# Patient Record
Sex: Male | Born: 1940
Health system: Southern US, Community
[De-identification: ages and names within clinical notes are randomized; demographics above are authoritative.]

## PROBLEM LIST (undated history)

## (undated) DIAGNOSIS — Z8679 Personal history of other diseases of the circulatory system: Secondary | ICD-10-CM

## (undated) DIAGNOSIS — I00 Rheumatic fever without heart involvement: Secondary | ICD-10-CM

## (undated) DIAGNOSIS — I219 Acute myocardial infarction, unspecified: Secondary | ICD-10-CM

## (undated) DIAGNOSIS — I10 Essential (primary) hypertension: Secondary | ICD-10-CM

## (undated) DIAGNOSIS — J449 Chronic obstructive pulmonary disease, unspecified: Secondary | ICD-10-CM

## (undated) DIAGNOSIS — M109 Gout, unspecified: Secondary | ICD-10-CM

## (undated) DIAGNOSIS — I2510 Atherosclerotic heart disease of native coronary artery without angina pectoris: Secondary | ICD-10-CM

## (undated) DIAGNOSIS — I209 Angina pectoris, unspecified: Secondary | ICD-10-CM

## (undated) DIAGNOSIS — N390 Urinary tract infection, site not specified: Secondary | ICD-10-CM

## (undated) DIAGNOSIS — G629 Polyneuropathy, unspecified: Secondary | ICD-10-CM

## (undated) DIAGNOSIS — N289 Disorder of kidney and ureter, unspecified: Secondary | ICD-10-CM

## (undated) DIAGNOSIS — C669 Malignant neoplasm of unspecified ureter: Secondary | ICD-10-CM

## (undated) DIAGNOSIS — M75120 Complete rotator cuff tear or rupture of unspecified shoulder, not specified as traumatic: Secondary | ICD-10-CM

## (undated) DIAGNOSIS — M1990 Unspecified osteoarthritis, unspecified site: Secondary | ICD-10-CM

## (undated) DIAGNOSIS — R0602 Shortness of breath: Secondary | ICD-10-CM

## (undated) DIAGNOSIS — E785 Hyperlipidemia, unspecified: Secondary | ICD-10-CM

## (undated) DIAGNOSIS — Z8719 Personal history of other diseases of the digestive system: Secondary | ICD-10-CM

## (undated) DIAGNOSIS — E119 Type 2 diabetes mellitus without complications: Secondary | ICD-10-CM

## (undated) DIAGNOSIS — I255 Ischemic cardiomyopathy: Secondary | ICD-10-CM

## (undated) DIAGNOSIS — I739 Peripheral vascular disease, unspecified: Secondary | ICD-10-CM

## (undated) DIAGNOSIS — J189 Pneumonia, unspecified organism: Secondary | ICD-10-CM

## (undated) DIAGNOSIS — R011 Cardiac murmur, unspecified: Secondary | ICD-10-CM

## (undated) DIAGNOSIS — E7800 Pure hypercholesterolemia, unspecified: Secondary | ICD-10-CM

## (undated) HISTORY — PX: CARPAL TUNNEL RELEASE: SHX101

## (undated) HISTORY — PX: CHOLECYSTECTOMY: SHX55

## (undated) HISTORY — PX: CORONARY ARTERY BYPASS GRAFT: SHX141

## (undated) HISTORY — PX: GLAUCOMA SURGERY: SHX656

## (undated) HISTORY — PX: TRIGGER FINGER RELEASE: SHX641

## (undated) HISTORY — PX: CARDIAC CATHETERIZATION: SHX172

## (undated) HISTORY — PX: EYE SURGERY: SHX253

---
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
## 1995-09-06 DIAGNOSIS — Z8719 Personal history of other diseases of the digestive system: Secondary | ICD-10-CM

## 1995-09-06 HISTORY — DX: Personal history of other diseases of the digestive system: Z87.19

## 2001-12-04 DIAGNOSIS — C669 Malignant neoplasm of unspecified ureter: Secondary | ICD-10-CM

## 2001-12-04 HISTORY — DX: Malignant neoplasm of unspecified ureter: C66.9

## 2001-12-19 HISTORY — PX: NEPHRECTOMY: SHX65

## 2006-08-13 ENCOUNTER — Inpatient Hospital Stay (HOSPITAL_COMMUNITY): Admission: EM | Admit: 2006-08-13 | Discharge: 2006-08-14 | Payer: Self-pay | Admitting: Emergency Medicine

## 2007-01-25 ENCOUNTER — Encounter: Payer: Self-pay | Admitting: Emergency Medicine

## 2007-01-25 ENCOUNTER — Inpatient Hospital Stay (HOSPITAL_COMMUNITY): Admission: AD | Admit: 2007-01-25 | Discharge: 2007-01-26 | Payer: Self-pay | Admitting: Family Medicine

## 2007-01-25 ENCOUNTER — Ambulatory Visit (HOSPITAL_COMMUNITY): Admission: RE | Admit: 2007-01-25 | Discharge: 2007-01-25 | Payer: Self-pay | Admitting: Emergency Medicine

## 2007-01-30 ENCOUNTER — Ambulatory Visit (HOSPITAL_COMMUNITY): Admission: RE | Admit: 2007-01-30 | Discharge: 2007-01-30 | Payer: Self-pay | Admitting: Family Medicine

## 2007-02-05 ENCOUNTER — Emergency Department (HOSPITAL_COMMUNITY): Admission: EM | Admit: 2007-02-05 | Discharge: 2007-02-05 | Payer: Self-pay | Admitting: Emergency Medicine

## 2007-02-08 ENCOUNTER — Ambulatory Visit (HOSPITAL_COMMUNITY): Admission: RE | Admit: 2007-02-08 | Discharge: 2007-02-08 | Payer: Self-pay | Admitting: Family Medicine

## 2007-02-08 ENCOUNTER — Ambulatory Visit: Payer: Self-pay | Admitting: Orthopedic Surgery

## 2007-02-15 ENCOUNTER — Ambulatory Visit: Payer: Self-pay | Admitting: Orthopedic Surgery

## 2007-04-14 ENCOUNTER — Emergency Department (HOSPITAL_COMMUNITY): Admission: EM | Admit: 2007-04-14 | Discharge: 2007-04-14 | Payer: Self-pay | Admitting: Emergency Medicine

## 2007-06-11 ENCOUNTER — Ambulatory Visit (HOSPITAL_COMMUNITY): Admission: RE | Admit: 2007-06-11 | Discharge: 2007-06-11 | Payer: Self-pay | Admitting: Family Medicine

## 2007-06-12 ENCOUNTER — Emergency Department (HOSPITAL_COMMUNITY): Admission: EM | Admit: 2007-06-12 | Discharge: 2007-06-13 | Payer: Self-pay | Admitting: Emergency Medicine

## 2007-07-09 ENCOUNTER — Encounter: Payer: Self-pay | Admitting: Family Medicine

## 2007-07-09 ENCOUNTER — Inpatient Hospital Stay (HOSPITAL_COMMUNITY): Admission: EM | Admit: 2007-07-09 | Discharge: 2007-07-11 | Payer: Self-pay | Admitting: Emergency Medicine

## 2007-07-10 ENCOUNTER — Encounter: Payer: Self-pay | Admitting: Interventional Radiology

## 2007-07-10 ENCOUNTER — Encounter (INDEPENDENT_AMBULATORY_CARE_PROVIDER_SITE_OTHER): Payer: Self-pay | Admitting: Interventional Radiology

## 2007-07-14 ENCOUNTER — Ambulatory Visit: Payer: Self-pay | Admitting: Internal Medicine

## 2007-07-14 ENCOUNTER — Inpatient Hospital Stay (HOSPITAL_COMMUNITY): Admission: EM | Admit: 2007-07-14 | Discharge: 2007-07-18 | Payer: Self-pay | Admitting: Emergency Medicine

## 2007-07-15 ENCOUNTER — Encounter (INDEPENDENT_AMBULATORY_CARE_PROVIDER_SITE_OTHER): Payer: Self-pay | Admitting: Internal Medicine

## 2007-07-24 ENCOUNTER — Encounter: Payer: Self-pay | Admitting: Interventional Radiology

## 2007-07-26 ENCOUNTER — Ambulatory Visit (HOSPITAL_COMMUNITY): Admission: RE | Admit: 2007-07-26 | Discharge: 2007-07-26 | Payer: Self-pay | Admitting: Interventional Radiology

## 2007-07-30 ENCOUNTER — Inpatient Hospital Stay (HOSPITAL_COMMUNITY): Admission: EM | Admit: 2007-07-30 | Discharge: 2007-08-03 | Payer: Self-pay | Admitting: Emergency Medicine

## 2007-07-30 ENCOUNTER — Ambulatory Visit: Payer: Self-pay | Admitting: Pulmonary Disease

## 2007-07-31 ENCOUNTER — Encounter: Payer: Self-pay | Admitting: Pulmonary Disease

## 2007-08-01 ENCOUNTER — Ambulatory Visit: Payer: Self-pay | Admitting: Vascular Surgery

## 2007-08-07 ENCOUNTER — Emergency Department (HOSPITAL_COMMUNITY): Admission: EM | Admit: 2007-08-07 | Discharge: 2007-08-07 | Payer: Self-pay | Admitting: Emergency Medicine

## 2007-08-15 ENCOUNTER — Encounter (INDEPENDENT_AMBULATORY_CARE_PROVIDER_SITE_OTHER): Payer: Self-pay | Admitting: Emergency Medicine

## 2007-08-15 ENCOUNTER — Inpatient Hospital Stay (HOSPITAL_COMMUNITY): Admission: EM | Admit: 2007-08-15 | Discharge: 2007-08-17 | Payer: Self-pay | Admitting: Emergency Medicine

## 2007-08-20 ENCOUNTER — Ambulatory Visit (HOSPITAL_COMMUNITY): Admission: RE | Admit: 2007-08-20 | Discharge: 2007-08-20 | Payer: Self-pay | Admitting: Interventional Radiology

## 2007-08-27 ENCOUNTER — Emergency Department (HOSPITAL_COMMUNITY): Admission: EM | Admit: 2007-08-27 | Discharge: 2007-08-27 | Payer: Self-pay | Admitting: Emergency Medicine

## 2007-10-02 ENCOUNTER — Inpatient Hospital Stay (HOSPITAL_COMMUNITY): Admission: EM | Admit: 2007-10-02 | Discharge: 2007-10-03 | Payer: Self-pay | Admitting: Emergency Medicine

## 2007-10-03 ENCOUNTER — Encounter (INDEPENDENT_AMBULATORY_CARE_PROVIDER_SITE_OTHER): Payer: Self-pay | Admitting: Internal Medicine

## 2007-11-14 ENCOUNTER — Emergency Department (HOSPITAL_COMMUNITY): Admission: EM | Admit: 2007-11-14 | Discharge: 2007-11-14 | Payer: Self-pay | Admitting: Emergency Medicine

## 2007-11-19 ENCOUNTER — Emergency Department (HOSPITAL_COMMUNITY): Admission: EM | Admit: 2007-11-19 | Discharge: 2007-11-19 | Payer: Self-pay | Admitting: Emergency Medicine

## 2008-05-27 ENCOUNTER — Ambulatory Visit: Payer: Self-pay | Admitting: Cardiology

## 2008-06-26 ENCOUNTER — Ambulatory Visit: Payer: Self-pay | Admitting: Cardiology

## 2008-06-29 ENCOUNTER — Observation Stay (HOSPITAL_COMMUNITY): Admission: EM | Admit: 2008-06-29 | Discharge: 2008-06-29 | Payer: Self-pay | Admitting: Emergency Medicine

## 2008-07-21 ENCOUNTER — Ambulatory Visit: Payer: Self-pay | Admitting: Surgery

## 2008-07-24 ENCOUNTER — Ambulatory Visit: Payer: Self-pay | Admitting: Surgery

## 2008-07-24 ENCOUNTER — Ambulatory Visit (HOSPITAL_COMMUNITY): Admission: RE | Admit: 2008-07-24 | Discharge: 2008-07-24 | Payer: Self-pay | Admitting: Surgery

## 2008-08-07 ENCOUNTER — Ambulatory Visit: Payer: Self-pay | Admitting: *Deleted

## 2008-08-11 ENCOUNTER — Inpatient Hospital Stay (HOSPITAL_COMMUNITY): Admission: AD | Admit: 2008-08-11 | Discharge: 2008-08-20 | Payer: Self-pay | Admitting: Surgery

## 2008-08-11 ENCOUNTER — Ambulatory Visit: Payer: Self-pay | Admitting: Surgery

## 2008-08-12 ENCOUNTER — Encounter: Payer: Self-pay | Admitting: Surgery

## 2008-08-13 ENCOUNTER — Ambulatory Visit: Payer: Self-pay | Admitting: Physical Medicine & Rehabilitation

## 2008-08-20 ENCOUNTER — Inpatient Hospital Stay (HOSPITAL_COMMUNITY)
Admission: RE | Admit: 2008-08-20 | Discharge: 2008-08-27 | Payer: Self-pay | Admitting: Physical Medicine & Rehabilitation

## 2008-08-20 ENCOUNTER — Ambulatory Visit: Payer: Self-pay | Admitting: Physical Medicine & Rehabilitation

## 2008-09-15 ENCOUNTER — Ambulatory Visit: Payer: Self-pay | Admitting: Surgery

## 2008-09-23 ENCOUNTER — Ambulatory Visit: Payer: Self-pay | Admitting: Vascular Surgery

## 2008-09-26 ENCOUNTER — Ambulatory Visit: Payer: Self-pay | Admitting: Vascular Surgery

## 2008-09-26 ENCOUNTER — Inpatient Hospital Stay (HOSPITAL_COMMUNITY): Admission: RE | Admit: 2008-09-26 | Discharge: 2008-09-30 | Payer: Self-pay | Admitting: Vascular Surgery

## 2008-09-26 ENCOUNTER — Encounter: Payer: Self-pay | Admitting: Vascular Surgery

## 2008-09-29 ENCOUNTER — Ambulatory Visit: Payer: Self-pay | Admitting: Physical Medicine & Rehabilitation

## 2008-10-13 ENCOUNTER — Ambulatory Visit: Payer: Self-pay | Admitting: Surgery

## 2008-10-27 ENCOUNTER — Ambulatory Visit: Payer: Self-pay | Admitting: Surgery

## 2008-11-13 IMAGING — CR DG CHEST 2V
2 series · 2 of 2 positions shown · non-contrast
Comparison: none

HISTORY: Right chest pain, dyspnea, right lower leg swelling

[view not recorded (1 of 2)]
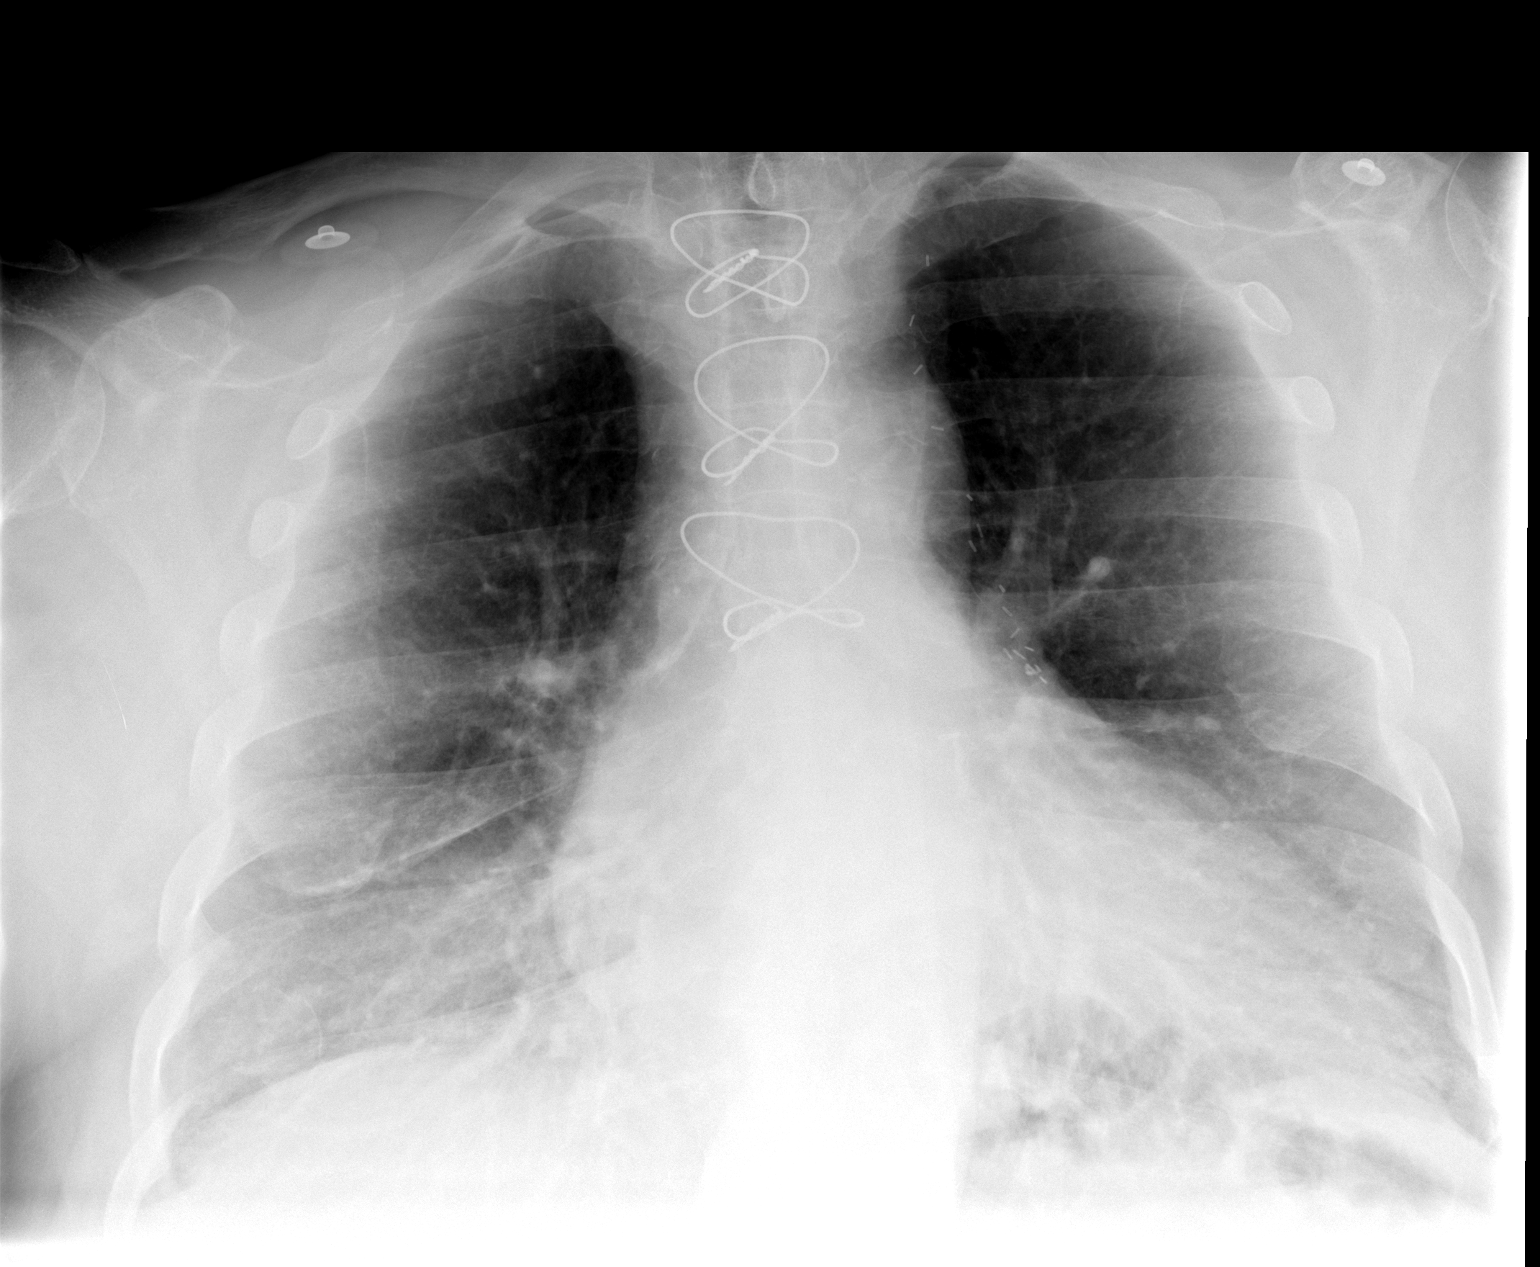

[view not recorded (2 of 2)]
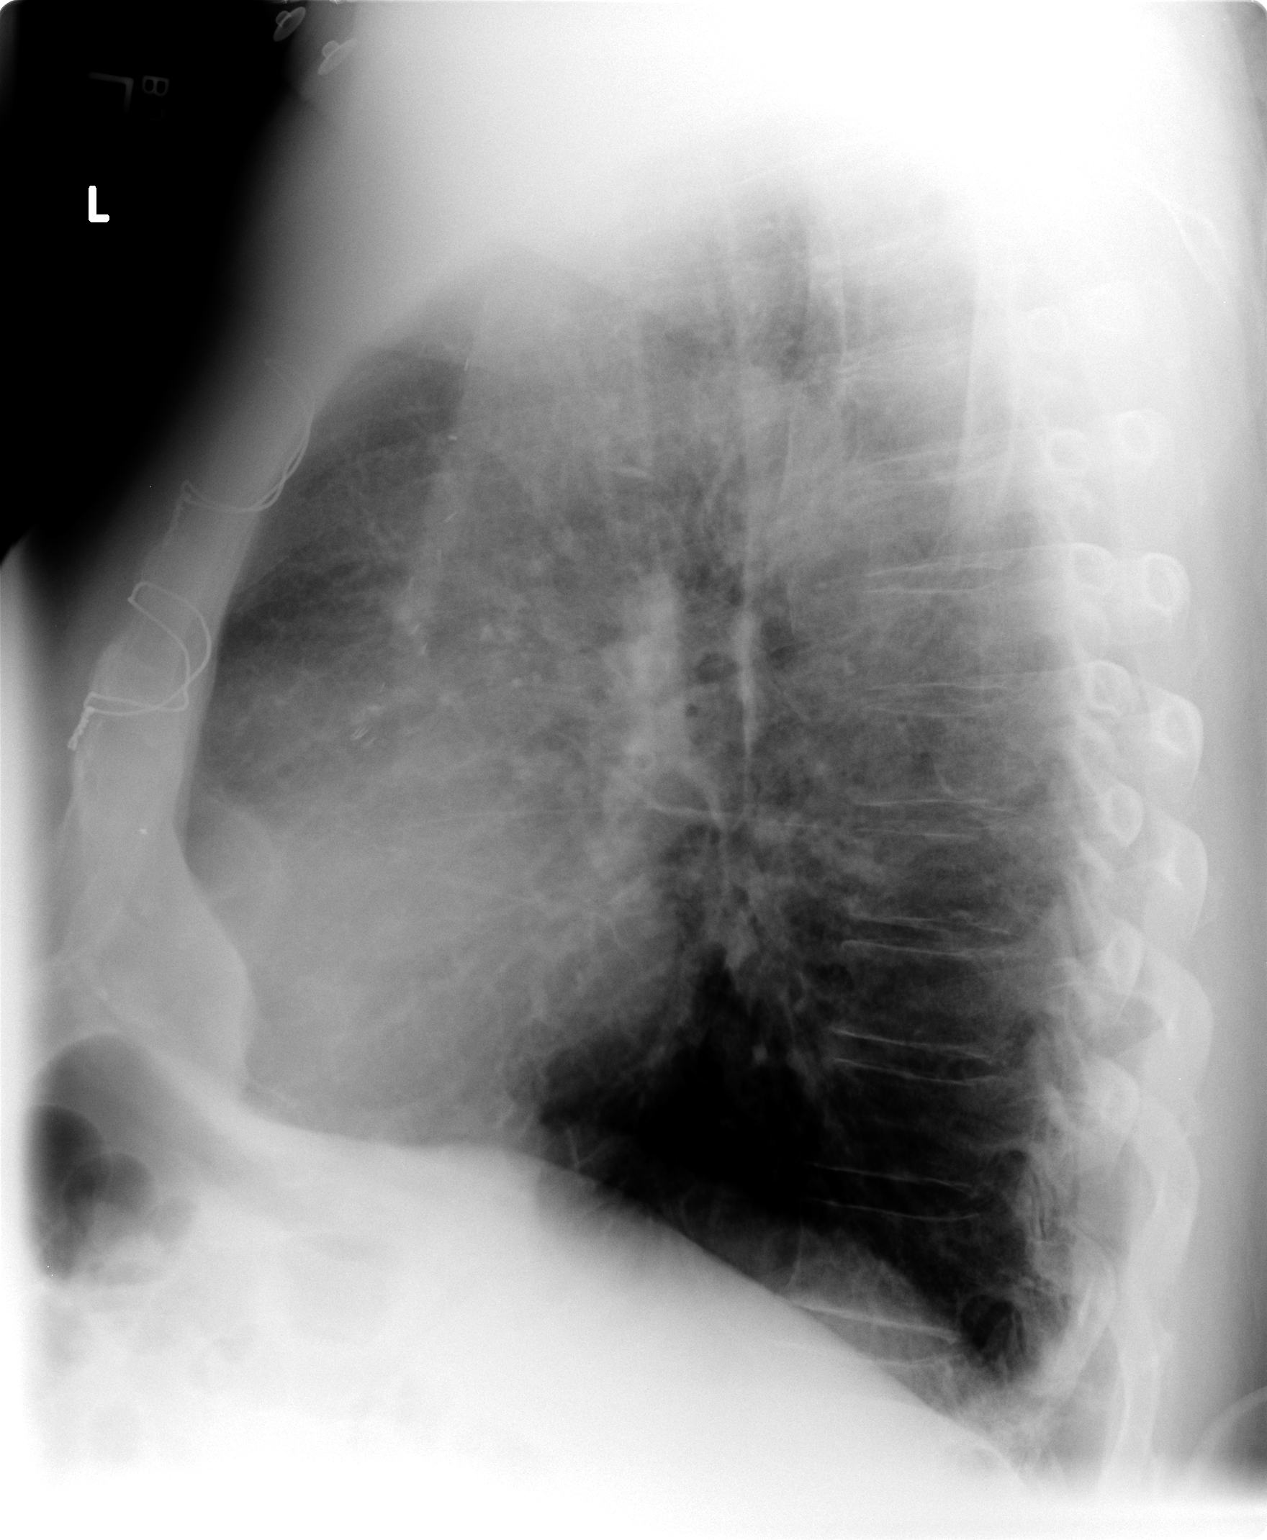

[2 of 2 positions shown; findings below may reference images not displayed]

CHEST 2 VIEWS:

Comparison 08/13/2006

Cardiac enlargement status post median sternotomy.
Slight vascular congestion.
Chronic accentuation of right basilar markings with either mild residual
atelectasis or infiltrate at right base.
Left basilar atelectasis present.
Upper lungs clear.
COPD and mild bronchitic changes.
IMPRESSION: Cardiomegaly status post median sternotomy.
COPD with probable bibasilar atelectasis though residual infiltrate or scarring
at right base not excluded.
No new abnormalities.

## 2008-11-24 ENCOUNTER — Ambulatory Visit: Payer: Self-pay | Admitting: Surgery

## 2009-02-05 ENCOUNTER — Ambulatory Visit (HOSPITAL_COMMUNITY): Admission: RE | Admit: 2009-02-05 | Discharge: 2009-02-05 | Payer: Self-pay | Admitting: Family Medicine

## 2009-02-26 ENCOUNTER — Encounter (INDEPENDENT_AMBULATORY_CARE_PROVIDER_SITE_OTHER): Payer: Self-pay | Admitting: *Deleted

## 2009-02-26 ENCOUNTER — Ambulatory Visit (HOSPITAL_COMMUNITY): Admission: RE | Admit: 2009-02-26 | Discharge: 2009-02-26 | Payer: Self-pay | Admitting: Family Medicine

## 2009-02-26 LAB — CONVERTED CEMR LAB
ALT: 17 U/L
AST: 17 U/L
Albumin: 4.2 g/dL
Alkaline Phosphatase: 57 U/L
BUN: 24 mg/dL
CO2: 22 meq/L
Calcium: 9.6 mg/dL
Chloride: 97 meq/L
Cholesterol: 220 mg/dL
Creatinine, Ser: 1.19 mg/dL
Free T4: 4.3 ng/dL
Glucose, Bld: 98 mg/dL
HDL: 43 mg/dL
Potassium: 4.6 meq/L
Sodium: 139 meq/L
TSH: 0.752 u[IU]/mL
Total Protein: 7.6 g/dL
Triglycerides: 486 mg/dL

## 2009-03-16 ENCOUNTER — Emergency Department (HOSPITAL_COMMUNITY): Admission: EM | Admit: 2009-03-16 | Discharge: 2009-03-16 | Payer: Self-pay | Admitting: Emergency Medicine

## 2009-05-12 ENCOUNTER — Ambulatory Visit (HOSPITAL_COMMUNITY): Admission: RE | Admit: 2009-05-12 | Discharge: 2009-05-12 | Payer: Self-pay | Admitting: Family Medicine

## 2009-07-02 ENCOUNTER — Ambulatory Visit (HOSPITAL_BASED_OUTPATIENT_CLINIC_OR_DEPARTMENT_OTHER): Admission: RE | Admit: 2009-07-02 | Discharge: 2009-07-02 | Payer: Self-pay | Admitting: Orthopedic Surgery

## 2009-08-05 HISTORY — PX: BELOW KNEE LEG AMPUTATION: SUR23

## 2009-08-12 ENCOUNTER — Encounter: Payer: Self-pay | Admitting: Cardiology

## 2009-08-19 ENCOUNTER — Encounter (INDEPENDENT_AMBULATORY_CARE_PROVIDER_SITE_OTHER): Payer: Self-pay | Admitting: *Deleted

## 2009-08-24 DIAGNOSIS — I509 Heart failure, unspecified: Secondary | ICD-10-CM | POA: Insufficient documentation

## 2009-08-24 DIAGNOSIS — I2589 Other forms of chronic ischemic heart disease: Secondary | ICD-10-CM

## 2009-08-24 DIAGNOSIS — M545 Low back pain: Secondary | ICD-10-CM | POA: Insufficient documentation

## 2009-09-05 HISTORY — PX: ABOVE KNEE LEG AMPUTATION: SUR20

## 2009-10-29 ENCOUNTER — Telehealth (INDEPENDENT_AMBULATORY_CARE_PROVIDER_SITE_OTHER): Payer: Self-pay

## 2009-11-28 ENCOUNTER — Encounter: Payer: Self-pay | Admitting: Orthopedic Surgery

## 2009-11-28 ENCOUNTER — Encounter: Admission: RE | Admit: 2009-11-28 | Discharge: 2009-11-28 | Payer: Self-pay | Admitting: Family Medicine

## 2009-12-02 ENCOUNTER — Ambulatory Visit (HOSPITAL_COMMUNITY): Admission: RE | Admit: 2009-12-02 | Discharge: 2009-12-02 | Payer: Self-pay | Admitting: Family Medicine

## 2009-12-02 ENCOUNTER — Encounter: Payer: Self-pay | Admitting: Orthopedic Surgery

## 2009-12-03 ENCOUNTER — Ambulatory Visit (HOSPITAL_COMMUNITY): Admission: RE | Admit: 2009-12-03 | Discharge: 2009-12-03 | Payer: Self-pay | Admitting: Orthopedic Surgery

## 2009-12-03 ENCOUNTER — Ambulatory Visit: Payer: Self-pay | Admitting: Orthopedic Surgery

## 2009-12-03 DIAGNOSIS — M75120 Complete rotator cuff tear or rupture of unspecified shoulder, not specified as traumatic: Secondary | ICD-10-CM

## 2009-12-03 DIAGNOSIS — M25469 Effusion, unspecified knee: Secondary | ICD-10-CM | POA: Insufficient documentation

## 2009-12-03 HISTORY — DX: Complete rotator cuff tear or rupture of unspecified shoulder, not specified as traumatic: M75.120

## 2009-12-04 ENCOUNTER — Ambulatory Visit (HOSPITAL_COMMUNITY): Admission: RE | Admit: 2009-12-04 | Discharge: 2009-12-04 | Payer: Self-pay | Admitting: Orthopedic Surgery

## 2009-12-04 ENCOUNTER — Ambulatory Visit: Payer: Self-pay | Admitting: Orthopedic Surgery

## 2009-12-07 ENCOUNTER — Telehealth: Payer: Self-pay | Admitting: Orthopedic Surgery

## 2009-12-08 ENCOUNTER — Telehealth: Payer: Self-pay | Admitting: Orthopedic Surgery

## 2009-12-09 ENCOUNTER — Ambulatory Visit (HOSPITAL_COMMUNITY): Admission: RE | Admit: 2009-12-09 | Discharge: 2009-12-09 | Payer: Self-pay | Admitting: Orthopedic Surgery

## 2009-12-10 ENCOUNTER — Telehealth: Payer: Self-pay | Admitting: Orthopedic Surgery

## 2009-12-15 ENCOUNTER — Encounter (INDEPENDENT_AMBULATORY_CARE_PROVIDER_SITE_OTHER): Payer: Self-pay | Admitting: *Deleted

## 2009-12-15 ENCOUNTER — Encounter: Payer: Self-pay | Admitting: Orthopedic Surgery

## 2009-12-17 ENCOUNTER — Telehealth: Payer: Self-pay | Admitting: Orthopedic Surgery

## 2009-12-23 ENCOUNTER — Encounter (INDEPENDENT_AMBULATORY_CARE_PROVIDER_SITE_OTHER): Payer: Self-pay | Admitting: *Deleted

## 2009-12-25 ENCOUNTER — Emergency Department (HOSPITAL_COMMUNITY): Admission: EM | Admit: 2009-12-25 | Discharge: 2009-12-25 | Payer: Self-pay | Admitting: Emergency Medicine

## 2009-12-29 ENCOUNTER — Telehealth: Payer: Self-pay | Admitting: Orthopedic Surgery

## 2010-01-28 ENCOUNTER — Telehealth: Payer: Self-pay | Admitting: Orthopedic Surgery

## 2010-02-08 ENCOUNTER — Encounter (INDEPENDENT_AMBULATORY_CARE_PROVIDER_SITE_OTHER): Payer: Self-pay | Admitting: Orthopedic Surgery

## 2010-02-08 ENCOUNTER — Ambulatory Visit (HOSPITAL_COMMUNITY): Admission: RE | Admit: 2010-02-08 | Discharge: 2010-02-08 | Payer: Self-pay | Admitting: Orthopedic Surgery

## 2010-03-02 ENCOUNTER — Telehealth (INDEPENDENT_AMBULATORY_CARE_PROVIDER_SITE_OTHER): Payer: Self-pay

## 2010-03-18 ENCOUNTER — Telehealth: Payer: Self-pay | Admitting: Orthopedic Surgery

## 2010-05-26 ENCOUNTER — Emergency Department (HOSPITAL_COMMUNITY): Admission: EM | Admit: 2010-05-26 | Discharge: 2010-05-26 | Payer: Self-pay | Admitting: Emergency Medicine

## 2010-06-10 ENCOUNTER — Emergency Department (HOSPITAL_COMMUNITY): Admission: EM | Admit: 2010-06-10 | Discharge: 2010-06-10 | Payer: Self-pay | Admitting: Emergency Medicine

## 2010-09-01 ENCOUNTER — Inpatient Hospital Stay (HOSPITAL_COMMUNITY): Admission: EM | Admit: 2010-09-01 | Discharge: 2010-09-03 | Payer: Self-pay | Source: Home / Self Care

## 2010-09-02 ENCOUNTER — Encounter (INDEPENDENT_AMBULATORY_CARE_PROVIDER_SITE_OTHER): Payer: Self-pay | Admitting: Internal Medicine

## 2010-09-24 NOTE — H&P (Signed)
[name], [name]               ACCOUNT NO.:  [id number]  MEDICAL RECORD NO.:  [id number]          PATIENT TYPE:  EMS  LOCATION:  ED                            FACILITY:  [facility]  PHYSICIAN:  Osvaldo Shipper, MD     DATE OF BIRTH:  04/01/1941  DATE OF ADMISSION:  09/02/2010 DATE OF DISCHARGE:  LH                             HISTORY & PHYSICAL   PRIMARY CARE PHYSICIAN:  Dr. Regino Schultze.  CARDIOLOGIST:  Used to be Dr. Dietrich Pates.  ADMISSION DIAGNOSES: 1. Dyspnea, etiology unclear. 2. Abnormal cardiac enzymes with a known history of coronary artery     disease. 3. Diaphoresis. 4. Hypoglycemia in a patient with diabetes type 2. 5. Peripheral vascular disease.  CHIEF COMPLAINT:  Feeling miserable.  HISTORY OF PRESENT ILLNESS:  The patient is a 70 year old Caucasian male, who is a very poor historian, who has a history of left above-knee amputation 2 years ago, who comes into the hospital because of complaints of feeling miserable.  He tells me that he has been feeling hot and cold all day long.  He denies any chest pain per say, but has been having shortness of breath.  He has a smoker's cough.  He is had some dry heaves.  His blood sugars have been running low today in the 60s for which he had to call EMS.  He denies any fever or chills.  No sick contacts.  He had been having right knee pain ever since his fall back in October, but does not take any medications at home.  He did have 2 days ago pain in his neck and in the left arm.  He denies any such pain currently.  So basically, a very poor history.  No specific details are available, but the patient just tells me that he does not feel well. He also admits to having lost a lot of weight in the last couple of years, but is unable to quantify.  MEDICATIONS AT HOME:  Unfortunately, the family did not bring in his home medication list.  He is on metformin only for his diabetes.  He is not on any insulin.  He also takes zolpidem for  insomnia.  The rest of the medications will be brought in tomorrow morning.  ALLERGIES: 1. IVP DYE. 2. PENICILLIN. 3. ERYTHROMYCIN.  PAST MEDICAL HISTORY: 1. Positive for coronary artery disease, status post CABG 6 years ago     in [place]. 2. He has had above-knee amputation on the left side 2 years ago.  He     prior to that had a femoral-popliteal bypass.  He has had knee     procedures, including arthroscopies in the past. 3. 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 4. He has a history of diabetes. 5. Hypertension. 6. Gout. 7. Arthritis. 8. He has had multiple UTIs in the past.  SOCIAL HISTORY:  He lives in [place] with his family.  He smokes half- pack of cigarettes on a daily basis.  He denies any illicit drug use. Occasional alcohol use.  He is a retired Archivist.  FAMILY HISTORY:  Positive  for diabetes and amputations.  REVIEW OF SYSTEMS:  GENERAL:  Positive for weakness and malaise.  HEENT: Unremarkable.  CARDIOVASCULAR:  As in HPI.  RESPIRATORY:  As in HPI. GI:  As in HPI.  GU:  Unremarkable.  NEUROLOGICAL:  Positive for numbness of his right foot.  PSYCHIATRIC:  Positive for anxiety.  Other systems reviewed and found to be negative or as mentioned in the HPI.  PHYSICAL EXAMINATION:  VITAL SIGNS:  Temperature 98.0, blood pressure 153/99, heart rate 100-110 and regular, respiratory rate 20, saturation 96% on room air. GENERAL:  He is a slightly overweight Caucasian male in no distress, very anxious. HEENT:  Head is normocephalic, atraumatic.  Pupils are equal reacting. No pallor, no icterus.  Oral mucous membranes moist.  No oral lesions noted. NECK:  Soft and supple.  No thyromegaly appreciated.  No cervical, supraclavicular or inguinal lymphadenopathy present. LUNGS:  Clear to auscultation bilaterally. CARDIOVASCULAR:  S1-S2 is normal, regular.  No S3-S4, rubs, murmurs or bruits. ABDOMEN:  Soft, nontender, nondistended.  Bowel  sounds are present.  No masses or organomegaly appreciated. EXTREMITIES:  He does not have any pedal edema.  His right foot is cold to palpation.  Pulses are very poor.  The right knee does not have any obvious swelling. SKIN:  Show that he does have a significant amount of sweating at this time.  No rashes are appreciated.  LABORATORY DATA:  His CBC shows a white count of 10.7, hemoglobin is 14.9, platelet count is 219.  Coags are normal.  CMET showed a glucose of 96, all the rest of the parameters were normal.  Cardiac enzymes; total CK is 327, CK-MB is 20.5, troponin, however, is negative x2.  IMAGING STUDIES:  He had a chest x-ray, which showed no acute findings. A CT of the abdomen and pelvis without contrast also showed no acute findings.  He had EKGs done here in the hospital.  He has had two of them, which showed sinus rhythm with normal axis at 103, intervals appear to be in the normal range.  No Q-waves.  No concerning ST or T-wave changes are noted.  The two EKGs done in the ED all show similar findings.  EKG compared to EKG from 2009, also looks quite similar.  ASSESSMENT:  This is a 70 year old Caucasian male, who presents with nonspecific symptoms of dyspnea, positive CKs with negative troponins. He does have a history of coronary artery disease with coronary artery bypass grafting in the past.  Diagnosis at this time is not entirely clear.  Thromboembolic event is a possibility, so a VQ scan will be done.  We will consult cardiology in the morning to evaluate him considering his history of coronary artery disease.  PLAN: 1. Dyspnea.  We will also check an ABG, VQ scan as mentioned above and     cardiology consultation. 2. Diaphoresis, etiology is unclear.  We will check a TSH level.  We     will give him some anxiolytic medications.  It does not appear that     he is withdrawing from any medication.  He has not been taken off     of any medication recently.  A  urine drug screen may be helpful, so     we will obtain that as well.  A UA will be checked as well to make     sure there is no UTI. 3. Abnormal CKs.  We will consult cardiology.  An echo done back in  2008, showed cardiomyopathy with an EF of 25%, so we will repeat an     echocardiogram here and we will ask for [place] to see this     patient. 4. Diabetes with hypoglycemia.  We will check an HbA1c.  We will hold     his metformin for now.  Check his CBGs without any coverage for     now. 5. Hypertension.  We will put him on some nitro paste and try to bring     his blood pressure down.  The case was discussed with Dr. Rubin Payor with Dr. Marcelle Overlie from [facility] in [place] and they recommended admission here at [facility].  Heparin apparently was recommended, so we will continue with intravenous heparin as well.  Aspirin will also be provided.  The patient is a full code.  Further management decisions will depend on results of further testing and patient's response to treatment.   Osvaldo Shipper, MD     GK/MEDQ  D:  09/02/2010  T:  09/02/2010  Job:  [id number]  cc:   Kirk Ruths, M.D. Fax: [phone]  Gerrit Friends. Dietrich Pates, MD, [facility] [address] [place], [place] [address]  Electronically Signed by Osvaldo Shipper MD on 09/23/2010 07:41:03 PM

## 2010-09-26 ENCOUNTER — Encounter: Payer: Self-pay | Admitting: Emergency Medicine

## 2010-09-26 ENCOUNTER — Encounter: Payer: Self-pay | Admitting: Family Medicine

## 2010-10-05 NOTE — Progress Notes (Signed)
**Note De-Identified Vonette Grosso Obfuscation** Summary: refills/overdue for f/u   Phone Note Outgoing Call   Call placed by: Larita Fife Nadiyah Zeis LPN,  March 02, 2010 9:31 AM Summary of Call: Pt. is 2 yrs. overdue for f/u. Sent refill request back to [facility] and ask that pt. call office for refills.

## 2010-10-05 NOTE — Progress Notes (Signed)
Summary: patient wants to return for fluid on knee  Phone Note Call from Patient   Caller: Patient Summary of Call: Patient called to request an appt here to have fluid drawn from his RT knee.  States Dr Romeo Apple is the only doctor who was able to get the fluid from his knee.  States he recently was hospitalized in [place] d/t blood sugar dropping and BP elevating + kidney failure, and was advised to see orthopedist, PCP and urologist.   I advised fol up with Dr Rennis Chris, who did arthroscopy surgery on this knee in June, or ER.  Patient insistent on checking with Dr Romeo Apple. Initial call taken by: Cammie Sickle,  March 18, 2010 3:39 PM  Follow-up for Phone Call        no!  Follow-up by: Fuller Canada MD,  March 22, 2010 8:35 AM  Additional Follow-up for Phone Call Additional follow up Details #1::        Advised patient;  per patient and wife, patient did see Dr. Rennis Chris on Friday and is feeling better. Additional Follow-up by: Cammie Sickle,  March 22, 2010 10:03 AM

## 2010-10-05 NOTE — Miscellaneous (Signed)
Summary: pt order in home advanced care  Clinical Lists Changes  Orders: Added new Referral order of Physical Therapy Referral (PT) - Signed

## 2010-10-05 NOTE — Progress Notes (Signed)
Summary: MRI appointment.  Phone Note Outgoing Call   Call placed by: Waldon Reining,  December 08, 2009 11:22 AM Call placed to: Patient Action Taken: Appt scheduled Summary of Call: I called to give the patient his MRI appointment at [facility] on 12-09-09 at 11:30. Patient has Medicare, no precert is needed. Patient is aware of his appointment.

## 2010-10-05 NOTE — Progress Notes (Signed)
Summary: Referral to [facility] for knee surgery.  Phone Note Outgoing Call   Call placed by: Waldon Reining,  December 17, 2009 2:27 PM Call placed to: Specialist Action Taken: Information Sent Summary of Call: I faxed a referral for this patient to [facility] for a knee arthroscopy.

## 2010-10-05 NOTE — Progress Notes (Signed)
Summary: wants MRI results  Phone Note Call from Patient   Caller: Patient Summary of Call: [name] (October 29, 2040) called about his MRI results.  His wife brought the disc by yesterday.  He is still in severe pain and is asking if you will call him  [facility], as he knows you will not be in the office after today at lunch.  He is concerned that he will have to go thru the weekend with no releif. His # [id number] Initial call taken by: Jacklynn Ganong,  December 10, 2009 10:34 AM

## 2010-10-05 NOTE — Progress Notes (Signed)
Summary: Initial Evaluation  Initial Evaluation   Imported By: Jacklynn Ganong 12/03/2009 07:50:00  _____________________________________________________________________  External Attachment:    Type:   Image     Comment:   External Document

## 2010-10-05 NOTE — Progress Notes (Signed)
Summary: having knee surgery 02/08/10  Phone Note Call from Patient   Summary of Call: [name] (04/29/2041) wanted you to know that Dr. Rennis Chris will be doing his knee surgery on June 6,2011. Initial call taken by: Jacklynn Ganong,  Jan 28, 2010 10:43 AM

## 2010-10-05 NOTE — Miscellaneous (Signed)
Summary: referral to [facility]  Clinical Lists Changes  Orders: Added new Referral order of Orthopedic Surgeon Referral (Ortho Surgeon) - Signed

## 2010-10-05 NOTE — Letter (Signed)
Summary: *Orthopedic Consult Note  [facility]  [address] [place] [address]  [place], [place] [address]   Phone: [phone]  Fax: [phone]    Re:    [name] DOB:    Jul 07, 1941   Dear: Earma Reading   Thank you for requesting that we see the above patient for consultation.  A copy of the detailed office note will be sent under separate cover, for your review.  Evaluation today is consistent with: torn rotator cuff LEFT shoulder, large RIGHT knee joint effusion   Our recommendation is for: surgical treatment for the LEFT shoulder.  However the patient will need to be referred to another facility that can handle his medical problems.  He did not want to go to [facility] so he is decided to live with the shoulder problem  RIGHT knee effusion.  We could not aspirate the knee today because we could not move him he could not given any of our tables even with the slide board.  He will have his knee aspirated on Friday at the procedure room in the hospital       Thank you for this opportunity to look after your patient.  Sincerely,   Terrance Mass. MD.

## 2010-10-05 NOTE — Progress Notes (Signed)
Summary: Progress note  Progress note   Imported By: Jacklynn Ganong 12/03/2009 07:51:04  _____________________________________________________________________  External Attachment:    Type:   Image     Comment:   External Document

## 2010-10-05 NOTE — Progress Notes (Signed)
   Phone Note Outgoing Call   Call placed by: Larita Fife Via LPN,  October 29, 2009 4:38 PM Summary of Call: Patient advised he needs to follow up with Dr. Dietrich Pates, he is 15 months overdue. He states he will call back Monday and schedule appt. I refilled his Lasix for a 30 day supply and explained we cant continue to refill his meds unless he folllows up, he states he understands info. given.    Prescriptions: FUROSEMIDE 40 MG TABS (FUROSEMIDE) Take 1 tablet by mouth two times a day  #60 x 0   Entered by:   Larita Fife Via LPN   Authorized by:   Kathlen Brunswick, MD, [facility]   Signed by:   Larita Fife Via LPN on 16/06/9603   Method used:   Electronically to        [facility] (retail)       [address]       [place], [place]  [address]       Ph: [phone]       Fax: [phone]   RxID:   [id number]

## 2010-10-05 NOTE — Letter (Signed)
Summary: surgery order Rt knee aspiration  surgery order Rt knee aspiration   Imported By: Cammie Sickle 12/26/2009 11:54:49  _____________________________________________________________________  External Attachment:    Type:   Image     Comment:   External Document

## 2010-10-05 NOTE — Progress Notes (Signed)
Summary: patient c/o continuing pain, asked status of referral  Phone Note Call from Patient   Caller: Patient Summary of Call: Patient called to fol/up on referral; states he had to go to [facility] ER a few days ago due to pain in knee.   Per phone w/[facility], spoke w/Connie. States Dr Rennis Chris has just reviewed the referral  and has agreed to see [name]. Their office will be calling patient to schedule appt.  Patient informed. Initial call taken by: Cammie Sickle,  December 29, 2009 11:43 AM

## 2010-10-05 NOTE — Letter (Signed)
Summary: Xray order LT shoulder  Xray order LT shoulder   Imported By: Cammie Sickle 12/02/2009 15:01:33  _____________________________________________________________________  External Attachment:    Type:   Image     Comment:   External Document

## 2010-10-05 NOTE — Letter (Signed)
Summary: *Referral Letter  [facility]  [address] [place] [address] [address]  [place], [place] [address]   Phone: [phone]  Fax: [phone]    12/15/2009  Thank you in advance for agreeing to see my patient:  [name] [address] [place], [place]  [address]  Phone: [phone]  Reason for Referral: RIGHT KNEE MEDIAL AND LATERAL MENISCUS TEARS RT KNEE WITH LEFT LOWER EXTREMITY AKA AND LEFT SHOULDER ROTATOR CUFF TEAR   Procedures Requested: CONSULT AND TREAT   Current Medical Problems:   Current Medications: 1)  FUROSEMIDE 40 MG TABS (FUROSEMIDE) Take 1 tablet by mouth two times a day   Past Medical History: 1)  CAD 2)  Diabetes Type 2 3)  Hypertension 4)  PVD 5)  asthma 6)  depression 7)  anxiety   Prior History of Blood Transfusions:   Pertinent Labs:    Thank you again for agreeing to see our patient; please contact us if you have any further questions or need additional information.  Sincerely,  Fuller Canada MD

## 2010-10-05 NOTE — Letter (Signed)
Summary: History form  History form   Imported By: Jacklynn Ganong 12/09/2009 07:59:42  _____________________________________________________________________  External Attachment:    Type:   Image     Comment:   External Document

## 2010-10-05 NOTE — Progress Notes (Signed)
Summary: Message left on Saturday  Phone Note Call from Patient   Caller: Patient Summary of Call: [name] (February 08, 2041) left a message on Saturday (12/05/09) at 9:30 AM that his knee that was aspirated on Friday was hurting pretty badly. He asked for one of the doctors to call him at [phone]. Don't know if he called the hospital to have you paged or not. Initial call taken by: Jacklynn Ganong,  December 07, 2009 8:08 AM  Follow-up for Phone Call        order MRI right knee   sprain  Follow-up by: Fuller Canada MD,  December 07, 2009 9:49 AM

## 2010-10-05 NOTE — Progress Notes (Signed)
Summary: call from patient  Phone Note Call from Patient   Summary of Call: Patient called to see if there is any other cardiologist that Dr Romeo Apple knows of that can "get him in soon" re: medical clearance, per pending surgery by Dr Rennis Chris, [facility].  Advised to re-check with their office as they are the treating surgeon's office. Initial call taken by: Cammie Sickle,  Jan 28, 2010 2:42 PM

## 2010-10-05 NOTE — Assessment & Plan Note (Signed)
Summary: NEW PROB/KNEE+SHOULDER INJ/XRAYS PRIOR [facility]/BRING MRI FILM,SHO...   Visit Type:  Initial Consult Referring Provider:  Dr.[name] Primary Provider:  [facility]  CC:  Left shoulder pain and right knee pain.  History of Present Illness: 70 year old male who had 2 falls and injured his LEFT shoulder and his RIGHT knee.  He is an amputee on the LEFT above the knee has not been able to be fitted for a prosthesis deep to skin changes and problems from the original prosthetic leg  He comes in today complaining of severe RIGHT knee pain.  The physician's assistant Earma Reading asked that he be seen within a day or 2.  He complains of sharp throbbing stabbing burning pain which is a 10 out of 10 in his knee and on relieved by Percocet 7.5 or oxycodone 15 mg.  The pain is constant.  He also has pain in his LEFT shoulder since the fall he is already had an x-ray which showed some mild degenerative changes in his MRI shows a torn rotator cuff.  He has a history of diabetes, coronary artery disease and hypertension.  Unfortunately I cannot move him to a table in my office so he will have to have a procedure at the hospital to have his knee aspirated in procedure room    Allergies (verified): 1)  ! Penicillin 2)  ! * Ivp Dye 3)  ! Erythromycin  Past History:  Past Medical History: CAD Diabetes Type 2 Hypertension PVD asthma depression anxiety  Review of Systems Constitutional:  Denies weight loss, weight gain, fever, chills, and fatigue. Cardiovascular:  Complains of chest pain; denies palpitations, fainting, and murmurs. Respiratory:  Complains of short of breath; denies wheezing, couch, tightness, pain on inspiration, and snoring . Gastrointestinal:  Complains of constipation; denies heartburn, nausea, vomiting, diarrhea, and blood in your stools. Genitourinary:  Complains of difficulty urinating and bleeding in urine; denies frequency, urgency, painful urination, and flank  pain. Neurologic:  Denies numbness, tingling, unsteady gait, dizziness, tremors, and seizure. Musculoskeletal:  Complains of joint pain; denies swelling, instability, stiffness, redness, heat, and muscle pain. Endocrine:  Denies excessive thirst, exessive urination, and heat or cold intolerance. Psychiatric:  Denies nervousness, depression, anxiety, and hallucinations. Skin:  Denies changes in the skin, poor healing, rash, itching, and redness. HEENT:  Denies blurred or double vision, eye pain, redness, and watering. Immunology:  Denies seasonal allergies, sinus problems, and allergic to bee stings. Hemoatologic:  Denies easy bleeding and brusing.  Physical Exam  Additional Exam:  appearance is normal with the abnormality of his amputation on the LEFT lower extremity  His LEFT arm has normal pulses and perfusion his RIGHT leg he has normal pulse and perfusion  It is no lymphadenopathy in the cervical spine or the axilla  He cannot ambulate.  His RIGHT knee is swollen with a tense effusion range of motion is 10-90.  His knee is tender his knee is stable muscle strength and tone are normal  Skin is normal shoulder and knee  He's oriented to time person place his mood and affect are normal has normal sensation LEFT arm and RIGHT lower extremity     Impression & Recommendations:  Problem # 1:  RUPTURE ROTATOR CUFF (ICD-727.61) Assessment New  probably needs surgery based on the MRI but he needs to have it at a facility that can handle his medical condition he does not want to the [facility] he says he'll rather live with it  Orders: New Patient Level III [id number])  Problem # 2:  JOINT EFFUSION, KNEE (ICD-719.06) Assessment: New  recommend aspiration RIGHT knee in the procedure room tomorrow.  Orders: New Patient Level III ([id number])  Patient Instructions: 1)  Friday Procedure Room @ 0830

## 2010-11-15 LAB — COMPREHENSIVE METABOLIC PANEL
ALT: 18 U/L (ref 0–53)
AST: 24 U/L (ref 0–37)
Albumin: 4.2 g/dL (ref 3.5–5.2)
Alkaline Phosphatase: 55 U/L (ref 39–117)
BUN: 16 mg/dL (ref 6–23)
Chloride: 101 mEq/L (ref 96–112)
GFR calc Af Amer: >60 mL/min (ref >60)
Potassium: 4.5 mEq/L (ref 3.5–5.1)
Sodium: 138 mEq/L (ref 135–145)
Total Bilirubin: 0.5 mg/dL (ref 0.3–1.2)
Total Protein: 7.8 g/dL (ref 6.0–8.3)

## 2010-11-15 LAB — TROPONIN I: Troponin I: 0.02 ng/mL (ref 0.00–0.06)

## 2010-11-15 LAB — DIFFERENTIAL
Basophils Absolute: 0 10*3/uL (ref 0.0–0.1)
Basophils Absolute: 0 10*3/uL (ref 0.0–0.1)
Basophils Relative: 0 % (ref 0–1)
Basophils Relative: 1 % (ref 0–1)
Eosinophils Absolute: 0.1 10*3/uL (ref 0.0–0.7)
Eosinophils Absolute: 0.1 10*3/uL (ref 0.0–0.7)
Eosinophils Relative: 1 % (ref 0–5)
Monocytes Absolute: 0.9 10*3/uL (ref 0.1–1.0)
Monocytes Relative: 10 % (ref 3–12)
Monocytes Relative: 8 % (ref 3–12)
Neutrophils Relative %: 54 % (ref 43–77)

## 2010-11-15 LAB — URINALYSIS, ROUTINE W REFLEX MICROSCOPIC
Glucose, UA: NEGATIVE mg/dL
Leukocytes, UA: NEGATIVE
Protein, ur: 100 mg/dL — AB

## 2010-11-15 LAB — CBC
Hemoglobin: 13.2 g/dL (ref 13.0–17.0)
MCH: 32.8 pg (ref 26.0–34.0)
MCHC: 34.1 g/dL (ref 30.0–36.0)
MCV: 94.6 fL (ref 78.0–100.0)
Platelets: 219 10*3/uL (ref 150–400)
RBC: 4.48 MIL/uL (ref 4.22–5.81)
RDW: 13.5 % (ref 11.5–15.5)
WBC: 10.7 10*3/uL — ABNORMAL HIGH (ref 4.0–10.5)

## 2010-11-15 LAB — RAPID URINE DRUG SCREEN, HOSP PERFORMED
Amphetamines: NOT DETECTED
Benzodiazepines: NOT DETECTED
Cocaine: NOT DETECTED
Tetrahydrocannabinol: NOT DETECTED

## 2010-11-15 LAB — CARDIAC PANEL(CRET KIN+CKTOT+MB+TROPI)
CK, MB: 20.6 ng/mL (ref 0.3–4.0)
CK, MB: 21.5 ng/mL (ref 0.3–4.0)
Relative Index: 5.4 — ABNORMAL HIGH (ref 0.0–2.5)
Relative Index: 5.6 — ABNORMAL HIGH (ref 0.0–2.5)
Total CK: 371 U/L — ABNORMAL HIGH (ref 7–232)
Total CK: 396 U/L — ABNORMAL HIGH (ref 7–232)
Troponin I: 0.02 ng/mL (ref 0.00–0.06)

## 2010-11-15 LAB — LIPID PANEL
Cholesterol: 175 mg/dL (ref 0–200)
LDL Cholesterol: 66 mg/dL (ref 0–99)
Triglycerides: 189 mg/dL — ABNORMAL HIGH (ref <150)

## 2010-11-15 LAB — BASIC METABOLIC PANEL
BUN: 15 mg/dL (ref 6–23)
CO2: 28 mEq/L (ref 19–32)
Calcium: 9.3 mg/dL (ref 8.4–10.5)
Creatinine, Ser: 1.09 mg/dL (ref 0.4–1.5)
GFR calc non Af Amer: >60 mL/min (ref >60)
Glucose, Bld: 106 mg/dL — ABNORMAL HIGH (ref 70–99)
Sodium: 134 mEq/L — ABNORMAL LOW (ref 135–145)

## 2010-11-15 LAB — GLUCOSE, CAPILLARY
Glucose-Capillary: 117 mg/dL — ABNORMAL HIGH (ref 70–99)
Glucose-Capillary: 152 mg/dL — ABNORMAL HIGH (ref 70–99)
Glucose-Capillary: 99 mg/dL (ref 70–99)

## 2010-11-15 LAB — HEMOGLOBIN A1C: Mean Plasma Glucose: 120 mg/dL — ABNORMAL HIGH (ref <117)

## 2010-11-15 LAB — BLOOD GAS, ARTERIAL
Acid-base deficit: 3 mmol/L — ABNORMAL HIGH (ref 0.0–2.0)
O2 Saturation: 96.9 %
pH, Arterial: 7.366 (ref 7.350–7.450)
pO2, Arterial: 97.3 mmHg (ref 80.0–100.0)

## 2010-11-15 LAB — POCT CARDIAC MARKERS: Troponin i, poc: <0.05 ng/mL (ref 0.00–0.09)

## 2010-11-15 LAB — CK TOTAL AND CKMB (NOT AT ARMC)
CK, MB: 20.5 ng/mL (ref 0.3–4.0)
Relative Index: 6.3 — ABNORMAL HIGH (ref 0.0–2.5)
Total CK: 327 U/L — ABNORMAL HIGH (ref 7–232)

## 2010-11-22 LAB — APTT
aPTT: 30 seconds (ref 24–37)
aPTT: 30 seconds (ref 24–37)

## 2010-11-22 LAB — URINE MICROSCOPIC-ADD ON

## 2010-11-22 LAB — URINALYSIS, ROUTINE W REFLEX MICROSCOPIC
Bilirubin Urine: NEGATIVE
Nitrite: POSITIVE — AB
Protein, ur: NEGATIVE mg/dL
Specific Gravity, Urine: 1.017 (ref 1.005–1.030)
Urobilinogen, UA: 0.2 mg/dL (ref 0.0–1.0)

## 2010-11-22 LAB — PROTIME-INR
INR: 1.05 (ref 0.00–1.49)
INR: 1.1 (ref 0.00–1.49)
Prothrombin Time: 13.6 seconds (ref 11.6–15.2)
Prothrombin Time: 14.1 seconds (ref 11.6–15.2)

## 2010-11-22 LAB — COMPREHENSIVE METABOLIC PANEL
AST: 15 U/L (ref 0–37)
Albumin: 3.7 g/dL (ref 3.5–5.2)
BUN: 4 mg/dL — ABNORMAL LOW (ref 6–23)
BUN: 5 mg/dL — ABNORMAL LOW (ref 6–23)
CO2: 26 mEq/L (ref 19–32)
Calcium: 8.7 mg/dL (ref 8.4–10.5)
Chloride: 103 mEq/L (ref 96–112)
Creatinine, Ser: 0.76 mg/dL (ref 0.4–1.5)
Creatinine, Ser: 0.89 mg/dL (ref 0.4–1.5)
GFR calc Af Amer: >60 mL/min (ref >60)
GFR calc non Af Amer: >60 mL/min (ref >60)
Glucose, Bld: 95 mg/dL (ref 70–99)
Total Bilirubin: 0.5 mg/dL (ref 0.3–1.2)
Total Bilirubin: 0.7 mg/dL (ref 0.3–1.2)
Total Protein: 6.7 g/dL (ref 6.0–8.3)

## 2010-11-22 LAB — CBC
HCT: 38.9 % — ABNORMAL LOW (ref 39.0–52.0)
HCT: 40.3 % (ref 39.0–52.0)
MCHC: 34.3 g/dL (ref 30.0–36.0)
MCHC: 34.5 g/dL (ref 30.0–36.0)
MCV: 93.4 fL (ref 78.0–100.0)
MCV: 94.1 fL (ref 78.0–100.0)
Platelets: 323 10*3/uL (ref 150–400)
RBC: 4.17 MIL/uL — ABNORMAL LOW (ref 4.22–5.81)
RDW: 16.4 % — ABNORMAL HIGH (ref 11.5–15.5)

## 2010-11-22 LAB — GLUCOSE, CAPILLARY: Glucose-Capillary: 90 mg/dL (ref 70–99)

## 2010-12-09 LAB — GLUCOSE, CAPILLARY: Glucose-Capillary: 83 mg/dL (ref 70–99)

## 2010-12-09 LAB — POCT I-STAT, CHEM 8
Calcium, Ion: 1.09 mmol/L — ABNORMAL LOW (ref 1.12–1.32)
Glucose, Bld: 96 mg/dL (ref 70–99)
HCT: 41 % (ref 39.0–52.0)
Hemoglobin: 13.9 g/dL (ref 13.0–17.0)
TCO2: 26 mmol/L (ref 0–100)

## 2010-12-20 LAB — CBC
HCT: 27.9 % — ABNORMAL LOW (ref 39.0–52.0)
MCHC: 32.6 g/dL (ref 30.0–36.0)
MCV: 92.4 fL (ref 78.0–100.0)
RBC: 3.02 MIL/uL — ABNORMAL LOW (ref 4.22–5.81)
WBC: 8.7 10*3/uL (ref 4.0–10.5)

## 2010-12-20 LAB — URINALYSIS, ROUTINE W REFLEX MICROSCOPIC
Ketones, ur: NEGATIVE mg/dL
Nitrite: NEGATIVE
pH: 6.5 (ref 5.0–8.0)

## 2010-12-20 LAB — BASIC METABOLIC PANEL
BUN: 10 mg/dL (ref 6–23)
BUN: 2 mg/dL — ABNORMAL LOW (ref 6–23)
CO2: 26 mEq/L (ref 19–32)
Chloride: 96 mEq/L (ref 96–112)
Creatinine, Ser: 0.6 mg/dL (ref 0.4–1.5)
Creatinine, Ser: 0.73 mg/dL (ref 0.4–1.5)
GFR calc non Af Amer: >60 mL/min (ref >60)
Potassium: 3.9 mEq/L (ref 3.5–5.1)

## 2010-12-20 LAB — URINE CULTURE

## 2010-12-20 LAB — HEMOGLOBIN AND HEMATOCRIT, BLOOD
HCT: 27.4 % — ABNORMAL LOW (ref 39.0–52.0)
Hemoglobin: 9 g/dL — ABNORMAL LOW (ref 13.0–17.0)

## 2010-12-20 LAB — GLUCOSE, CAPILLARY
Glucose-Capillary: 104 mg/dL — ABNORMAL HIGH (ref 70–99)
Glucose-Capillary: 118 mg/dL — ABNORMAL HIGH (ref 70–99)
Glucose-Capillary: 122 mg/dL — ABNORMAL HIGH (ref 70–99)
Glucose-Capillary: 126 mg/dL — ABNORMAL HIGH (ref 70–99)
Glucose-Capillary: 137 mg/dL — ABNORMAL HIGH (ref 70–99)
Glucose-Capillary: 140 mg/dL — ABNORMAL HIGH (ref 70–99)
Glucose-Capillary: 145 mg/dL — ABNORMAL HIGH (ref 70–99)
Glucose-Capillary: 151 mg/dL — ABNORMAL HIGH (ref 70–99)
Glucose-Capillary: 155 mg/dL — ABNORMAL HIGH (ref 70–99)
Glucose-Capillary: 156 mg/dL — ABNORMAL HIGH (ref 70–99)
Glucose-Capillary: 168 mg/dL — ABNORMAL HIGH (ref 70–99)
Glucose-Capillary: 203 mg/dL — ABNORMAL HIGH (ref 70–99)
Glucose-Capillary: 90 mg/dL (ref 70–99)
Glucose-Capillary: 96 mg/dL (ref 70–99)
Glucose-Capillary: 98 mg/dL (ref 70–99)

## 2010-12-20 LAB — POCT I-STAT 4, (NA,K, GLUC, HGB,HCT)
Hemoglobin: 11.9 g/dL — ABNORMAL LOW (ref 13.0–17.0)
Potassium: 3.9 mEq/L (ref 3.5–5.1)

## 2010-12-20 LAB — URINE MICROSCOPIC-ADD ON

## 2010-12-20 LAB — HEPARIN LEVEL (UNFRACTIONATED): Heparin Unfractionated: <0.1 IU/mL — ABNORMAL LOW (ref 0.30–0.70)

## 2011-01-18 NOTE — Discharge Summary (Signed)
NAMEKAYDN, KUMPF NO.:  [id number]   MEDICAL RECORD NO.:  [id number]          PATIENT TYPE:  INP   LOCATION:  3029                         FACILITY:  [facility]   PHYSICIAN:  Nelda Bucks, MD DATE OF BIRTH:  Jan 29, 1941   DATE OF ADMISSION:  07/30/2007  DATE OF DISCHARGE:  08/03/2007                               DISCHARGE SUMMARY   DISCHARGE DIAGNOSES:  1. Status post acute respiratory failure in the setting of altered      mental status (resolved).  2. Probable chronic obstructive pulmonary disease in the setting of      active smoking.  3. Altered mental status (resolved).  4. Depression.  5. Chronic pain.  6. Probable left lower extremity cellulitis.  7. Left lower extremity occluded left femoral popliteal bypass      grafting and occluded left femoropopliteal occluded bypass graft.  8. Resolved acute renal failure.  9. Ischemic cardiomyopathy with ejection fraction of 25%, now      compensated.  10.Diabetes mellitus, type 2.   LABORATORY DATA:  1. Bronchial alveolar lavage obtained on July 31, 2007:      Currently, no organism seen.  2. July 30, 2007, urine culture negative.  3. July 30, 2007, blood cultures x2 both no growth to date      (preliminary status).  4. August 01, 2007, phosphorus 3.8.  5. August 01, 2007, magnesium 1.6.  6. August 01, 2007, sodium 134, potassium 4.3, chloride 104, CO2 22,      glucose 91, BUN 9, creatinine 0.94, calcium 8.5.  7. August 01, 2007, white blood cell count 5, hemoglobin 10.1,      hematocrit 30.1, platelet count 194,000.  Free T3 71.3, TSH 1.021,      free T4 5.1.  8. BNP on July 30, 2007 was 225.  9. Ammonia, July 30, 2007, was 14.  10.On July 30, 2007, AST is 35, ALT is 21.  11.Troponin I is 0.04 on July 30, 2007.  12.July 30, 2007, CK total is 1041, CK-MB is 10.8, relative index      1.   BRIEF HISTORY:  This is a 70 year old male patient with multiple  medical  comorbidities, recently discharged from the hospital on July 18, 2007 after being treated for acute respiratory failure, and exacerbation  of chronic obstructive pulmonary disease with an element of pulmonary  edema plus/minus pneumonia.  He was discharged to home and noted to be  in relatively poor health.  He had been followed for persistent weakness  and back pain following a recent kyphoplasty.  He had been followed by  Dr. Corliss Skains, who was working up perhaps another lumbar fracture.  Per  his wife, he was in his usual state of health up until 3 days prior to  admission on the 24th.  He was having progressive left lower extremity  leg pain first noted on the 20th to 21st, which was painful to the touch  and warm to palpation and difficulty to bear weight on.  He has had  intermittent episodes of chest pain for which he was using  nitroglycerin, and perhaps increasing shortness of breath; however, his  fiancee was unable to verify this.  At baseline, he can walk  approximately 12 feet before shortness of breath.  He did not exhibit  significant cough; he did have faint wheeze, that the morning of  admission on the 24th he awoke confused and was in and out of  consciousness.  His wife felt that he was perhaps a bit more labored in  respirations; EMS was called.  He was having episodes of snoring  respiration and apnea for which he was given Narcan with minimal  response.  He remained lethargic and therefore was intubated by the  emergency room physician and the Pulmonary Critical Care Service was  asked to admit.   HOSPITAL COURSE:  PROBLEM #1 - STATUS POST ACUTE RESPIRATORY FAILURE  SECONDARY TO ALTERED MENTAL STATUS:  As previously mentioned, Mr. Tejada  was seen in evaluation initially by the emergency room physician.  History was obtained primarily through fiancee.  The patient had had a 3-  day history of worsening leg pain, and then altered mental status.  He   was intubated secondary to airway protection in the emergency room.  Pulmonary Critical Care Team was asked to evaluate.  Upon presentation,  Mr. Vangorder was on full ventilatory support.  Chest x-ray demonstrated no  clear pneumonia, mild atelectasis, no edema.  He was afebrile without  leukocytosis.  A thorough workup including CT of head, which was  negative, LFTs, TSH level and cultures were all obtained.  Diagnostic  evaluation was negative for significant explanation of etiology.  The  patient's medications were held; he did have over 21 prescription  medications prescribed after last hospital discharge.  Within 24 hours,  Mr. Prindle was awake, oriented, and easily extubated without sequelae.  He is now on room air support.  His saturations are in the 90s, he has  no cough, he is currently at baseline dyspnea, reports no chest pain and  will be discharged to home with his primary pulmonary instructions to  include smoking cessation only.   PROBLEM #2 - CHRONIC OBSTRUCTIVE PULMONARY DISEASE:  This is likely in  the setting of chronic smoker.  Mr. Pepitone has been instructed to stop  smoking.  It certainly would be appropriate in the future to consider  pulmonary function testing, however, little to gain in this setting if  he continues to actively smoke.   PROBLEM #3 - ALTERED MENTAL STATUS:  Mr. Lassen again, as mentioned  above, was admitted with altered mental status and resultant respiratory  failure.  Primary therapy consisted of discontinuing his multiple  narcotics, and sedating medications, as well as antidepressants.  Within  24 hours, Mr. Lynam was awake and oriented.  He was therefore  supported.  His medication regimen was slowly and selectively  reevaluated.  See discharge medication list to follow. He will require  outpatient titration of his pain medications.   PROBLEM #4 - DEPRESSION/CHRONIC PAIN:  Mr. Demmer has both chronic back  pain with known lumbar fracture, as  well as neuropathic pain affecting  both lower extremities.  Acutely, he also had complaints of left  posterior popliteal pain which seems to have subsided.  In the past, Mr.  Schappell has had a very complicated medical regimen consisting of a  fentanyl patch, rescue Dilaudid, Seroquel, Xanax, Cymbalta, and Celexa.  This medication regimen was titrated down.  He will be discharged home  simply on Celexa for depression and Ultram for  breakthrough pain.  It is  likely that he will require titration up of his analgesic regimen;  however, he has been controlled in the inpatient setting.  We will leave  this up to his primary physician, Dr. Regino Schultze, in the outpatient setting  for further titration of his analgesic support.   PROBLEM #5 - PROBABLE LEFT LOWER EXTREMITY CELLULITIS:  To date,  cultures are negative from a blood culture perspective.  He was  empirically treated with initially IV vancomycin and Avelox.  The  vancomycin has been discontinued.  He will complete another 6 days of  oral Avelox for this.   PROBLEM #6 - LEFT LOWER EXTREMITY OCCLUDED LEFT FEMOROPOPLITEAL BYPASS  GRAFT:  During the evaluation for deep venous thrombosis, as well as  evaluation of potential left lower extremity cellulitis, and left  posterior popliteal discomfort, Mr. Victory underwent ultrasound of left  lower extremity.  During this exam, an incidental finding on venous  Duplex demonstrated occluded left femoropopliteal bypass graft.  Given  his history of peripheral vascular disease and chronic pain, Dr. Josephina Gip was consulted to evaluate him.  Dr. Candie Chroman impression was that  there was adequate perfusion to the lower extremity with adequate  capillary refill and adequate motion and sensation, and therefore he was  not felt to be an immediate surgical candidate.  His final  recommendations included no further evaluation or treatment necessary  unless the patient develops life-threatening limb  ischemia, infection,  gangrenous changes, or ulceration.  Because he is minimally ambulatory,  he was not felt to be a surgical candidate for further bypass grafting.  Recommendations on this would be to continue to monitor for the above  findings.  Mr. Bee did have an appointment with a local vascular  surgeon in his area for evaluation.  He was encouraged to keep this  appointment for minimal followup in the setting of his advanced vascular  disease.   PROBLEM #7 - ACUTE RENAL FAILURE SECONDARY TO MOST LIKELY VOLUME  DEPLETION IN THE SETTING OF ACE INHIBITORS AND MULTIPLE ANTIHYPERTENSIVE  REGIMEN:  This has now resolved with IV fluids.  See lab data above.  He  will be discharged to home on the medication regimen as mentioned above.   PROBLEM #8 - ISCHEMIC CARDIOMYOPATHY:  There have been no issues with  shock, pulmonary edema, or ischemia during this hospital admission.  We  will discharge him to home on the medication regimen above.   PROBLEM #9 - DIABETES TYPE 2:  This has been well controlled with  sliding-scale insulin.  He will be discharged to home on his baseline  Amaryl prescription.   DIET:  Mr. Smithey was instructed to follow a low-sodium, heart-healthy  diet.   ACTIVITY:  He was instructed to walk with assistance to his walker when  walking.   FOLLOWUP:  Dr. Regino Schultze up in [employer]; he will follow up with Dr.  Regino Schultze on December 1 at 10:45 a.m.   DISCHARGE INSTRUCTIONS:  He was instructed to stop smoking.   DISCHARGE MEDICATIONS:  1. Allopurinol 300-mg tab daily.  2. Aspirin 81-mg tab daily.  3. Lipitor 40-mg tab daily.  4. Colace 100-mg tab, three times a day.  5. Amaryl 4-mg tab daily.  6. Celexa 40-mg tab daily.  7. Lasix 20-mg daily.  8. Toprol-XL 25-mg tab daily.  9. Seroquel 100 mg before bed.  10.Ambien 10 mg before bed.  11.Avelox 400-mg tab, one tab daily for 6 days, then discontinue.  12.Nicotine  patch 21 mg daily.  13.Flomax 0.4-mg tab  daily.  14.Nitroglycerin as needed.  15.Ultram 50 mg tab, one to two tabs every 4 hours p.r.n. as needed      for pain.  16.He was instructed to stop his Duragesic, his Cymbalta, his Halcion,      Xanax, and Dilaudid.   SPECIAL DISCHARGE INSTRUCTIONS:  Clearly, Mr. Calabretta will need close  followup in the outpatient setting for further titration of his  analgesic support.  Additionally, we have arranged home PT, OT, and home  health nursing to follow him in the outpatient setting for his complex  medical regimen, as well as debility and significant pain.  He will  additionally need outpatient followup with Interventional Radiology  again, Dr. Corliss Skains, in the future.      Zenia Resides, NP      Nelda Bucks, MD  Electronically Signed    PB/MEDQ  D:  08/03/2007  T:  08/03/2007  Job:  [id number]   cc:   Kirk Ruths, M.D.

## 2011-01-18 NOTE — Letter (Signed)
May 27, 2008    Patrica Duel, M.D.  [address]  [place], [place] [address]   RE:  [name], [name]  MRN:  [id number]  /  DOB:  September 11, 1940   Dear Loraine Leriche,   [name] was seen in the office today at his request for management of  coronary artery disease.  His wife is already a patient of [facility] and suggested that he be seen here.  As you know, this nice  gentleman has coronary disease, having undergone coronary artery bypass  graft surgery in 2004 at [facility] in [place].  We are  seeking records of his prior cardiovascular care.  He has peripheral  vascular disease and has undergone femoral-popliteal bypass on the left  leg.  Unfortunately, he was evaluated by Dr. Hart Rochester earlier this year  and found to have an occluded graft.  He has not returned for planned  followup and probable reoperation.  He has had problems with nonhealing  ulcers of the left ankle, currently being managed by a local podiatrist.   Cardiovascular risk factors include diabetes and hypertension.  Lipid  status is unknown.  A previous physician, during an attempt to simplify  his medical regime, stopped treatment with statins.  [name] has  approximately 50-pack-year history of cigarette smoking, which continues  at 1 pack per day.   He has multiple additional medical issues.  He has been treated for  diabetic neuropathy.  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  He has had surgery for carpal tunnel syndrome  as well as a cholecystectomy.  He reports an allergy to INTRAVENOUS  CONTRAST that included respiratory compromise.  He is also allergic to  ERYTHROMYCIN, having developed urticaria after taking that medication.   Current medications include,  1. Aspirin 81 mg daily.  2. Metanx one daily.  3. Allopurinol 300 mg daily.  4. Glimepiride 4 mg daily.  5. Actos 15 mg daily.  6. Flomax 0.8 mg daily.  7. Ambien 10 mg  daily.  8. Triazolam 0.5 mg nightly.  9. Valium 10 mg nightly.  10.Seroquel 300 mg nightly.  11.KCl 10 mEq daily.  12.Furosemide 40 mg daily.  13.Oxycodone - high doses per a complex schedule and supplemented with      Endocet.   SOCIAL HISTORY:  Retired Emergency planning/management officer.  Married with 4 adult children  and lives locally.  Sedentary lifestyle.   FAMILY HISTORY:  Father died due to neoplastic disease and mother due to  myocardial infarction.  One brother died due to complications of  diabetes.   Review of systems is positive for intermittent dizziness, the need for  corrective lenses, a history of glaucoma, hearing loss, upper dentures,  a history of rheumatic fever as a child, intermittent palpitations,  GERD, urinary frequency, and diffuse arthritic discomfort.  Of late, he  has noted increased edema to the knees.   PHYSICAL EXAMINATION:  GENERAL:  Very pleasant gentleman in no acute  distress.  VITAL SIGNS:  The weight is 261, blood pressure 120/65, heart rate 100  and regular, respirations 14.  HEENT:  EOMs full; normal lids and conjunctivae; normal oral mucosa.  NECK:  No jugular venous distention; normal carotid upstrokes without  bruits.  LUNGS:  A few coarse rales at both bases.  CARDIAC:  Distant first and second heart sounds; no murmur nor gallop  appreciated.  ABDOMEN:  Soft and nontender; normal bowel sounds; no bruits;  no  organomegaly.  EXTREMITIES:  Left ankle is bandaged; 2-3+ pitting edema to the knees;  absent distal pulses by palpation.   EKG:  Sinus tachycardia; delayed R-wave progression - cannot exclude  previous septal myocardial infarction; borderline low voltage;  borderline IVCD; diffuse nonspecific ST-T wave abnormality.  No prior  tracing for comparison.   IMPRESSION:  [name] has some significant cardiac issues.  He is not  receiving optimal treatment for his ischemic cardiomyopathy.  Simvastatin 40 mg daily, ramipril 2.5 mg b.i.d., and  carvedilol 6.25 mg  b.i.d. will be added to his regime.  We will titrate the doses of these  drugs as appropriate.  His peripheral edema is not adequately  controlled.  His furosemide dosage will be increased to 40 mg b.i.d.  with monitoring of renal function and electrolytes.  A BNP level will  also be obtained.   We are seeking prior cardiology records, most notably the results of his  recent catheterization.  If he does not have a substrate for significant  ischemia, he probably will require implantation of an automatic  defibrillator.  I made the strongest appeal for him to stop cigarette  smoking.  This is incredibly destructive with respect to his peripheral  vascular disease not to mention his coronary disease.  We will arrange  for him to see Dr. Hart Rochester in followup.  I will see [name] again in 1  month and frequently thereafter until his cardiac status has been  stabilized.   Thanks so much for allowing me to follow him with you.    Sincerely,      [name] Friends. Dietrich Pates, MD, [facility]  Electronically Signed    RMR/MedQ  DD: 05/27/2008  DT: 05/27/2008  Job #: [id number]

## 2011-01-18 NOTE — Consult Note (Signed)
[name], [name]               ACCOUNT NO.:  [id number]   MEDICAL RECORD NO.:  [id number]          PATIENT TYPE:  INP   LOCATION:  4711                         FACILITY:  [facility]   PHYSICIAN:  Deanna Artis. Hickling, M.D.DATE OF BIRTH:  1941/09/05   DATE OF CONSULTATION:  10/02/2007  DATE OF DISCHARGE:                                 CONSULTATION   CHIEF COMPLAINT:  Left-sided numbness beginning at 12:30 a.m.   HISTORY OF PRESENT CONDITION:  A 70 year old right-handed married  gentleman who awakened with left-sided numbness early this morning.  Last known normal when he went to bed somewhere around 9 or 10 p.m.Marland Kitchen  Symptoms waxed and waned and then worsened after noon, and he was  brought to the [facility] emergency room.  He had simultaneous problems  with chest pain and palpitations. Took nitroglycerin.  He was evaluated  by Dr. Nelson Chimes who asked me to see him to evaluate him for stroke.  The patient's left side feels heavy and clumsy.   REVIEW OF SYSTEMS:  Remarkable for nausea, vomiting, dry heaves,  palpitations, chest pain, both last night, dysphagia, dizziness, blurred  vision, difficulty with focus.   PAST MEDICAL HISTORY:  1. Ventilator-dependent respiratory failure November 2008.  2. Depression.  3. COPD.  4. Lumbar spondylosis  5. Glaucoma.  6. Insomnia.  7. DVT of the right lower extremity.  8. Peripheral neuropathy.   RISK FACTORS FOR STROKE:  1. Diabetes mellitus, type 2,  2. Ejection fraction of 25%.  3. Coronary artery disease.  4. Obesity.  5. Dyslipidemia.  6. Tobacco use.   PAST SURGICAL HISTORY:  1. Coronary artery bypass graft in 2004.  2. Nephrectomy.  3. Compression fracture L1 with kyphoplasty.   FAMILY HISTORY:  Positive for diabetes mellitus, hypertension and  strokes.   SOCIAL HISTORY:  The patient lives in [place].  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  He smokes a few cigarettes per day.  He does not  use alcohol or  tobacco.   MEDICATIONS:  Include allopurinol, aspirin 81 mg, Flomax, Imdur, Lasix,  Lipitor, Colace, Amaryl, Celexa, Toprol XL, Seroquel, Ambien, Avelox,  Nicoderm, Ultram, and nitroglycerin.   DRUG ALLERGIES:  PENICILLIN, ERYTHROMYCIN, IVP DYE.   PHYSICAL EXAMINATION:  VITAL SIGNS:  The patient temperature was 97.1,  blood pressure 151/76 resting pulse 89, respirations 18, oxygen  saturation 100%.  EARS, NOSE, THROAT:  No infections.  No bruits.  LUNGS:  Distant breath sounds.  HEART:  No murmurs.  Pulses normal.  ABDOMEN:  Protuberant.  EXTREMITIES:  Unremarkable.  NEUROLOGIC:  Awake, mild dysarthria without dysphasia.  Cranial nerves:  Round reactive pupils.  Visual fields full to double simultaneous  stimuli.  Symmetric facial strength, decreased sensation on the left.  He does not split the midline.  Motor examination:  Normal strength on  the right and also in the left arm.  He has clumsy fine motor movements  in the left hand.  He has slight pronator drift on the left.  He has co-  contraction in his left leg which creates a bizarre  appearance of  weakness.  For the most part, he seems quite strong other than he has  weakness in his foot dorsiflexor on the left.  Sensory:  Hypesthesia.  He does not split the midline.  He has good stereoagnosis bilaterally.  Cerebellar examination:  Good finger-to-nose and heel-knee-shin on the  right. He has dysmetria on the left for both. He is areflexic.  He has  bilateral flexor plantar responses.   IMPRESSION:  1. Right thalamic versus posterior limb internal capsule non      hemorrhagic infarction. (434.01, 404.10) CT scan shows no acute      findings and no remote infarctions.  He has multiple risk factors      for stroke.   RECOMMENDATIONS:  1. MRI and MRA. He may need conscious sedation if Xanax does not work.  2. A 2-D echocardiogram, [facility]  to      interpret.  3. Carotid Doppler, transcranial  Doppler.  4. Laboratory:  Hemoglobin A1c, fasting lipid,  serum homocysteine.  5. His aspirin should be increased to 325 mg daily or change to Plavix      75 mg daily.  6. Swallow screen before he takes anything by mouth.  7. He needs instruction for tobacco cessation and also medication      compliance and also weight loss.  8. He needs physical and occupational therapy consult.      Deanna Artis. Sharene Skeans, M.D.  Electronically Signed     [facility]/MEDQ  D:  10/02/2007  T:  10/03/2007  Job:  [id number]   cc:   Nanetta Batty, M.D.

## 2011-01-18 NOTE — Assessment & Plan Note (Signed)
OFFICE VISIT   AMIER, [name]  DOB:  28-Feb-1941                                       08/07/2008  NFAOZ#:[id number]   The patient was worked in today as an add-on.  Complaining of left lower  extremity pain.  On review of his records, he has an occluded left  femoral popliteal bypass.  He was seen by Dr. Myra Gianotti in consultation  July 21, 2008.  He underwent an arteriogram 07/24/2008 and final  conclusion was that of possible candidate for a femoral-to-distal  anterior tibial bypass for limb salvage.   The patient presents today with swelling and pain in his left lower  extremity.  Blood pressure is 93/59, pulse 90, respirations 18 per  minute.  His left lower extremity does reveal a severely ulcerated  dorsum of the left foot.  End-stage ischemia.   The patient informed that he is at risk of limb loss.  Recommended  hospital admission for pain control and further aggressive therapy.  Patient declined hospital admission.  Plan is to have him follow up with  Dr. Myra Gianotti next week.   Balinda Quails, M.D.  Electronically Signed   [facility]/MEDQ  D:  08/07/2008  T:  08/08/2008  Job:  [id number]

## 2011-01-18 NOTE — Discharge Summary (Signed)
NAMEMARX, DOIG NO.:  [id number]   MEDICAL RECORD NO.:  [id number]          PATIENT TYPE:  IPS   LOCATION:  4037                         FACILITY:  [facility]   PHYSICIAN:  Ellwood Dense, M.D.   DATE OF BIRTH:  06-30-1941   DATE OF ADMISSION:  08/20/2008  DATE OF DISCHARGE:  08/27/2008                               DISCHARGE SUMMARY   DISCHARGE DIAGNOSES:  1. Ischemic left lower extremity secondary to peripheral vascular      disease and requiring below-knee amputation.  2. Delirium, resolved.  3. History of coronary artery disease.  4. Congestive heart failure, compensated.  5. Depression.  6. Anxiety disorder.  7. Insomnia.  8. Chronic pain.  9. Diabetes mellitus type 2.   HISTORY OF PRESENT ILLNESS:  [name] is a 70 year old male with  history of peripheral vascular disease, DM type 2 with left fem-pop  bypass graft with severe ulceration and tendon involvement.  He was  admitted December 7, with severe pain secondary to occluded bypass  graft.  Attempts at limb salvage were unsuccessful and the patient  underwent left BKA December 13, by Dr. Myra Gianotti.  Postop has had some  issues with cognitive changes.  Blood cultures done showed no evidence  of sepsis.  The patient with cognitive changes, question due to delirium  versus narcotics.  Medications were discontinued.  The patient was noted  to have febrile at event December 11, blood cultures x2 drawn, and the  patient started on IV antibiotics for early sepsis.  Confusion is  currently clearing.  Surgery recommends close monitoring of the  patient's stump as the patient may require more proximal amputation.  Therapies are initiated and the patient is noted to have poor safety  awareness with impulsivity with heavy task, poor exercise tolerance but  consistent participation with improvement of mental status.  Rehab  physicians evaluated the patient and the patient was deemed to be an  appropriate  candidate for inpatient rehab.   REVIEW OF SYMPTOMS:  The patient continued to have issues with severe  neuropathy, left BKA site, weakness, anorexia, insomnia, depression,  anxiety.   PAST MEDICAL HISTORY:  Significant for hypertension, coronary artery  disease with CABG, ischemic cardiomyopathy, history of CHF, peripheral  vascular disease, DM type 2 with diabetic neuropathy, history of DVT in  right lower extremity, insomnia, compression fracture with kyphoplasty  November 2008, history of PTSD requiring psychiatric admission, chronic  pain, left nephrectomy secondary to urethral cancer, dyslipidemia,  depression, anxiety disorder, multiple back surgeries, and carpal tunnel  release.   ALLERGIES:  IV DYE, PENICILLIN, and ERYTHROMYCIN.   FAMILY HISTORY:  Positive for coronary artery disease and diabetes.   SOCIAL HISTORY:  The patient is married.  He is a retired Physicist, medical, lives in two-level home with 5-8 steps at entry.  Does have  bedroom on first level.  He smokes half pack per day.  Does not use any  alcohol.   PHYSICAL EXAMINATION:  GENERAL:  The patient is well-nourished, well-  developed elderly male, slightly anxious appearing with some moderate  discomfort.  HEENT:  Atraumatic, normocephalic with extraocular  movements intact.  Nares patent.  Tongue midline.  Fair dentition.  LUNGS:  Clear to auscultation bilaterally without wheezes, rales, or  rhonchi.  HEART:  Regular rate and rhythm without murmurs or gallops.  ABDOMEN:  Soft, nontender with positive bowel sounds.  EXTREMITIES:  Bilateral upper extremity exam and right lower extremity  exam showed no evidence of edema or cyanosis.  Left BKA site showed  staples to be intact.  He is noted to have multiple black dark ischemic  areas laterally and medial aspect of incision.  Also medial left thigh,  he is noted to have ischemic appearing tissue going approximately 5 cm  upward.  NEUROLOGIC:  The patient is  alert and oriented x3, slightly anxious  appearing, reports high levels of anxiety and depression.  Cranial  nerves II through XII are intact.   HOSPITAL COURSE:  [name] is a 70 year old male admitted to  rehab on August 20, 2008, for inpatient therapies to consist of PT/OT  at least 3 hours 5 days a week.  Past admission, physiatrist, 24-hour  rehab RN, and therapy team have worked together to provide customized  collaborative interdisciplinary care.  Weekly team conference was held  during the patient's stay with discussion of the patient's progress,  setting goals, as well as discussing barriers to discharge.  Of issue  during this stay has been problems with pain management.  Due to the  patient's problems with delirium and confusion on acute services, slow  titration of pain medicines were done during this stay.  Duragesic patch  was resumed with dose titrated to 50 mcg an hour.  Additionally, Lyrica  has been increased to 150 mg b.i.d. with OxyIR 10 mg q.4-6 h., and  Ultram being used on p.r.n. basis.  Rehab RN has been assessing with  pain management and has been premedicating the patient prior to  therapies to help the patient better participate in therapy.  Rehab RN  has also been assisting with wound care, monitoring of skin for  prevention of breakdown, as well as bowel and bladder training.  Initially, the patient was noted to have issues with constipation and  bowel program was adjusted to help with this.  Dr. Jeanie Sewer, Psychiatry  was consulted for input on the patient's mood.  Dr. Jeanie Sewer has been  following closely with adjustments of meds.  The patient was started on  Zoloft 25 mg a day.  This was slowly increased to 75 mg by time of  discharge.  The patient also with complaints of insomnia.  He reports  using Ambien up to 40 mg nightly.  The patient was started on Remeron  7.5 mg, this was titrated to 50 mg nightly with addition of trazodone 25  mg  increased to 50 mg with the patient advised to repeat a dose if no  sleep in 45-50 minutes then use Ambien only if no relief with second  dose of trazodone.  By time of discharge, the patient's mood and hope  improved.  The patient has been advised to follow up with mental health  in [facility].  The patient has been given information to contact  them and he is to set up this appointment in the future.   The patient's left BKA has been monitored along and this is doing well.  He was maintained on IV antibiotics through December 22.  His left BKA  site shows decrease in cyanosis with some evidence  of improved  perfusion.  He is noted to have dark looking scab around incision line;  however, these also light appearing.  The patient has been advised to  keep left BKA fully extended out at all times with knee immobilizer to  be used during the day.  He is to follow up with Dr. Myra Gianotti for staple  removal in the next 10-14 days.  During his stay in rehab, [name]  has participated in therapy.  Initially, the patient was noted to be at  min assist for sit to stand transfers, mod assist for ambulating 4 feet.  The patient did show some decrease in endurance with some fears of  falling and reported history of right knee instability.  PT has been  working with the patient in exercise group for hip flexion with  stretching as well as performing BKA exercises.  They have been working  in extending left knee as well as important of extension and prone  position to help prevent hip contractures.  Lower extremity exercises  have also been focusing on range of motion and strengthening of  bilateral lower extremity.  At time of discharge, the patient was at  supervision level overall for transfers.  Family education was done with  wife to include car transfers, wheelchair to bed transfers, as well as  safety issues.  The patient is unable to get into the front seat of his  car, transfers were  done to the back seat with the patient able to  perform transfers at supervision level.  OT has been working with the  patient regarding self-care.  Initially, the patient was noted to  require min assist for lower body care.  OT has been working with group  therapy to increase activity tolerance as well as wheelchair mobility.  They have also been focusing on ADLs at edge of bed with focus on peri-  care.  Bathing and dressing education was done with wife with wife  advised about the importance of letting the patient complete tasks as  independently as possible as well as use of Adaptic equipment for self-  care.  At time of discharge, the patient is min assist for toilet to tub  bench transfer.  The patient is at supervision level for upper or lower  body care.  The patient will continue to receive further follow up home  health PT/OT by [facility] home [facility].  Home health RN has been set up for  wound monitoring as well as a Child psychotherapist set up for followup at home.  On August 27, 2008, the patient is discharged to home.   DISCHARGE MEDICATIONS:  1. Amaryl 1 mg p.o. per day.  2. Ferrous sulfate 325 mg b.i.d.  3. Multivitamin one p.o. per day.  4. Allopurinol 300 mg a day.  5. Duragesic patch 50 mcg change q.72 h.  6. Coreg 12.5 mg b.i.d.  7. Lasix 40 mg b.i.d.  8. K-Dur 10 mEq b.i.d.  9. Altace 2.5 mg b.i.d.  10.Toprol-XL 25 mg a day.  11.Zocor 40 mg nightly.  12.Remeron 15 mg nightly.  13.Zoloft 50 mg 1-1/2 p.o. per day.  14.Lyrica 150 mg b.i.d.  15.Oxycodone IR 5 mg 1-2 q.6-8 h. p.r.n. pain #90 RX.  16.Ultram 50 mg q.6-8 h. p.r.n. pain.  17.Trazodone 50 mg nightly for sleep may repeat in 45 minutes to 1      hour if still no sleep.   DIET:  Diabetic diet, low salt.   WOUND CARE:  Cleanse the area with normal saline, apply Kerlix and Ace  wrap daily.   SPECIAL INSTRUCTIONS:  24-hour supervision.  No alcohol, no smoking, no  driving.  Knee immobilizer on left stump to  keep extended fully, do not  use Neurontin, OxyContin, home dose fentanyl patch.  [facility]  to provide PT/OT and RN.   FOLLOW UP:  The patient to follow up with Dr. Thomasena Edis February 17, at 10  a.m., follow up with Dr. Myra Gianotti in 2 weeks for staple removal, follow  up with Dr. Regino Schultze for routine check, follow up with mental health in  next 1-2 weeks.      Greg Cutter, P.A.    ______________________________  Ellwood Dense, M.D.    PP/MEDQ  D:  08/27/2008  T:  08/28/2008  Job:  [id number]   cc:   Dr. Rexford Maus, M.D.  Jorge Ny, MD

## 2011-01-18 NOTE — H&P (Signed)
[name], [name]               ACCOUNT NO.:  [id number]   MEDICAL RECORD NO.:  [id number]          PATIENT TYPE:  INP   LOCATION:  3110                         FACILITY:  [facility]   PHYSICIAN:  Coralyn Helling, MD        DATE OF BIRTH:  09-Jan-1941   DATE OF ADMISSION:  07/30/2007  DATE OF DISCHARGE:                              HISTORY & PHYSICAL   REASON FOR ADMISSION:  Altered mental status/respiratory failure.   HISTORY OF PRESENT ILLNESS:  This is a 70 year old male patient with  multiple comorbidities who was recently discharged from the hospital on  November 12 after an episode of acute respiratory failure, exacerbated  by chronic obstructive pulmonary disease, and element of pulmonary  edema, plus/minus pneumonia.  Since the time of discharge, [name]  has continued to have poor health at home.  His wife reports persistent  weakness and persistent back pain even following recent kyphoplasty done  by Dr. Corliss Skains.  He actually was evaluated by Dr. Corliss Skains on  November 18, and underwent MRI at which time they were questioning as to  whether or not [name] may indeed have another lumbar fracture.  He  has been at home again, essentially debilitated and immobile and at  baseline pulmonary status up until approximately 3 days ago.  Since that  point, he has had progressive left lower extremity leg pain first noted  on November 20-21.  This is also painful to the touch and warm to  palpation with significant difficulty with weightbearing.  His wife also  noted increased swelling.  He has had intermittent episodes of chest  pain for which he was taking nitroglycerin for, and perhaps increasing  shortness of breath, however, his wife states that on his best day he  could walk approximately 12 feet before shortness of breath, his wife  was having difficulty quantifying how much worse his respirations really  were.  He did not exhibit significant cough, he had a faint wheeze  which  she felt was at baseline.  She did note mild increase in congestion.  On  the November 23, [name] was said to be, by his wife, in his normal  mental status.  This morning on a 24th he awoke confused, his wife  states that he had he would be in and out of consciousness.  She felt  that his respirations were perhaps a bit more labored, and he required  full assistance with two people when they tried to move him.  She called  the EMS, who transferred him to [facility].  EMS described  snoring respirations and episodes of apnea for which they gave him  Narcan with no response.  Upon presentation, he remained lethargic with  snoring respirations and therefore was intubated by the emergency room  physician.  The pulmonary critical care team was asked to evaluate and  admit [name] for further evaluation and therapy.   PAST MEDICAL HISTORY:  Extensive and includes the following:  1. Acute respiratory failure felt to be multifactorial secondary to      chronic obstructive  pulmonary disease and occult acute pulmonary      edema.  2. Cardiomyopathy with ejection fraction of 25% by Myoview.  3. Chronic obstructive pulmonary disease.  4. Mood disorder.  5. Anxiety disorder.  6. Tobacco abuse.  7. Hyperlipidemia.  8. Diabetes type 2.  9. Obesity.  10.Muscular weakness.  11.L1 compression fracture status post kyphoplasty.  12.History of coronary artery disease and CABG in 2004.  13.Additionally, baseline functional debility, able to walk      approximately 12 feet on a good day before onset of dyspnea.  14.Deep vein thrombosis of the right leg.  15.Nephrectomy.  16.Peripheral vascular disease, status post left femoral-popliteal      bypass, status post cholecystectomy, status post  left nephrectomy      for renal cell cancer.   ALLERGIES:  PENICILLIN, CONTRAST MEDIA, AND ERYTHROMYCIN.   SOCIAL HISTORY:  The patient is married, he lives with his wife in  [place].   He has two sons in several of her grandchildren.  He smoked  one pack a day for since the age of 70.  He reports occasional alcohol  use, he is retired from the [employer].   FAMILY HISTORY:  Positive for MI, positive diabetes, positive lymphoma.   HOME MEDICATIONS:  1. P.r.n. morphine.  2. Allopurinol 300 mg p.o. daily.  3. Aspirin 81 mg p.o. daily.  4. Lipitor 40 mg p.o. daily.  5. Colace 100 mg three times a day.  6. Cymbalta 20 mg twice daily.  7. Duragesic patch 72 mcg every 72 hours ,.  8. Amaryl 4 mg daily.  9. Imdur 30 mg daily.  10.Lisinopril 5 mg p.o. b.i.d.  11.MiraLax 17 grams p.r.n.  12.Seroquel 200 mg q.h.s.  13.Celexa 40 mg daily.  14.Lasix 20 mg daily.  15.Metoprolol 25 mg daily.  16.Nicotine patch daily.  17.Xanax b.i.d. unsure dose.  18.Flomax 4.4 mg daily.  19.Halcion 0.2 mg p.o. at bedtime.  20.Dilaudid p.r.n.  21.Ambien p.r.n.Marland Kitchen   REVIEW OF SYSTEMS:  Currently, [name] is sedated on full ventilatory  support, he does flinch and respond quite rapidly to examination of left  lower extremity particularly on palpation of the left popliteal area.   PHYSICAL EXAMINATION:  VITAL SIGNS:  Temperature 99.3, heart rate 103,  respirations 19, blood pressure 132/76, saturations 100% on 50% FIO2.  GENERAL:  This is a morbidly obese white male patient currently sedated  on full ventilatory support.  HEENT: He is normocephalic.  There is no JVD on exam.  NECK:  The neck is large.  There is no clear adenopathy.  He is orally  intubated.  The mucous membranes are moist.  PULMONARY:  Pulmonary exam demonstrates occasional rhonchi.  No  accessory muscle use on full ventilatory support.  The chest x-ray  demonstrates the ET tube approximately 7 cm above carina, but below the  thoracic inlet.  There is what appears to be mild basilar atelectasis  versus infiltrate.  He is currently on assist control ventilation.  Tidal volume 600, respiratory rate  15, with two breaths above set rate  with a minute ventilation approximately 10 liters a minute.  His PEET is  5, FIO2 50%.  Peak inspiratory pressures 38, plateau pressures 15,  saturations 100%.  An arterial blood gas obtained on full ventilatory  support with the same settings with the exception of the patient was on  100% FIO2 at the time demonstrated a pH of 7.4, pCO2 of 38, pO2 of 225,  pCO2 of 25.  Cardiac exam demonstrates sinus tachycardia with regular  rate and rhythm.  EXTREMITIES:  His lower extremities demonstrate venous  stasis changes, they are cold to palpation, with marked pain to  palpation of posterior popliteal area on the left, this area is also  warm to touch with the left lower extremity being more swollen than the  right.  ABDOMEN:  Obese, soft, no clear tenderness to palpation.  He has good  bowel sounds on exam.  GU:  Demonstrates clear concentrated urine.  NEUROLOGICAL:  Opens his eyes.  Will follow commands, he moves all  extremities spontaneously.   LABORATORY DATA:  Today, white blood cell count 9.6, hemoglobin 12.6,  hematocrit 38, platelet count 241.  Sodium 132, potassium 5.40, chloride  97, CO2 23, BUN 31, creatinine 1.87, glucose 68, CK-MB 15.7, troponin I  less than 0.05, PTT 34, PT/INR 12.6/0.9, D-dimer 1.11.  CT of head was  obtained that demonstrated no acute hemorrhagic changes.   IMPRESSION AND PLAN:  1. Acute respiratory failure secondary to altered mental status.  The      patient does demonstrate what appears to be bilateral atelectasis,      but no clear evidence of pneumonia.  His bicarbonate rate is normal      which suggests that respiratory event was relatively acute in      nature.  Plan for this at this time as to continue full ventilator      support, provide bronchodilators, sedation protocol, obtain      bronchioalveolar lavage, and follow-up ABG and chest x-ray.  2. Altered mental status.  Unclear etiology at this point several       differential diagnoses including potential syncope in the setting      of ischemic cardiomyopathy, sepsis in the setting of need to rule      out bacteremia/cellulitis.  3. Polysubstance interaction.  The patient is on multiple medications      in the form of both narcotics and antipsychotics.  4. Hypoxia is certainly in the differential as well.  The patient is      more awake on full ventilatory support.  His CT of head was      negative for acute bleed.  Plan for this is to continue full      support, hold sedating medications, follow neurological status,      obtain blood cultures, urine cultures and sputum cultures to rule      out occult source of infection.  5. Cardiomyopathy with EF by Myoview 25%.  Plan for this is to      continue ventilatory support, cycle enzymes to rule out myocardial      infarction, additionally we will continue telemetry monitoring to      look for potential evidence of bradycardic arrhythmias.  6. Chronic pain with known lumbar fracture.  Plan for this is      supportive care at this time.  7. Diabetes with neuropathy.  Plan for this is sliding-scale insulin.  8. Mood disorder.  Again, certainly his multiple medications for his      mood disorder complicated by his complex narcotic regimen could      certainly contribute to altered mental status.  Plan for this is to      hold off on mood altering drugs, we will provide short acting      narcotics, and benzodiazepines for ventilator support.  But      essentially, monitor his progress on  these therapies.  9. Left leg pain with significant pain on palpitation of the left      popliteal area.  Plan for this is to obtain venous ultrasound of      left lower extremity to rule out DVT.  At this point, we will place      him on prophylactic dose Lovenox with low threshold to begin      therapeutic therapy.  Of note, he has had past medical history of      deep vein thrombosis which included the right leg.   We will keep in      consideration potential evaluation for pulmonary emboli for which      would include CT of chest when renal function has improved.  He has      tolerated CT angiography in the past with13-hour steroid prep prior      to contrast injection.  10.Renal insufficiency.  His current creatinine is 1.87.  He has had a      past medical history of nephrectomy with last creatinine at time of      discharge 1.21.  Peripheral vascular disease, status post left      femoral-popliteal bypass, status post cholecystectomy, status post      left nephrectomy for renal cell cancer.   BEST PRACTICE:  We will place Mr. Cham on sliding scale insulin, for  hyperglycemia, Lovenox for DVT prophylaxis, and proton pump inhibitors  for stress dose, ulcer prophylaxis.  He will be admitted to the  intensive care for further evaluation and therapy.      Zenia Resides, NP      Coralyn Helling, MD  Electronically Signed    PB/MEDQ  D:  07/30/2007  T:  07/31/2007  Job:  [id number]

## 2011-01-18 NOTE — H&P (Signed)
[name], [name]               ACCOUNT NO.:  [id number]   MEDICAL RECORD NO.:  [id number]          PATIENT TYPE:  INP   LOCATION:  4711                         FACILITY:  [facility]   PHYSICIAN:  Ladell Pier, M.D.   DATE OF BIRTH:  1940-12-21   DATE OF ADMISSION:  10/02/2007  DATE OF DISCHARGE:                              HISTORY & PHYSICAL   CHIEF COMPLAINT:  Chest pain and left-sided weakness.   HPI:  Patient is a 70 year old male that presented to the ED with left-  sided numbness and weakness and chest pain.  He stated that it started  about 12 a.m. today when he woke up.  Throughout the day, the symptom of  the weakness on the left side got progressively worse then it continued  into him having chest pain.  He states that he normally has some chest  pain from time to time.  He also has some increase in shortness of  breath with the chest pain.   PAST MEDICAL HISTORY:  Significant for:  1. Chronic lower extremity and back pain.  2. Peripheral vascular disease with ABI of the left extremity 0.30      status post fem-pop, Dr. Hart [place].  3. Cardiomyopathy with EF of 25%.  4. Diabetes.  5. Diabetic neuropathy.  6. Mood disorder.  7. Tobacco abuse, quit about 3 weeks now.  8. Obesity.  9. Coronary artery disease status post CABG, 2004.  10.History of DVT in the right lower extremity.  11.Status post left nephrectomy secondary to renal cell cancer.  12.Depression.  13.Glaucoma.  14.Status post cholecystectomy.  15.History of left L1 compression fracture status post kyphoplasty.  16.Insomnia.   FAMILY HISTORY:  Noncontributory.   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  He  is married.  He normally smokes about a pack a day, quit for the past 3  weeks.  Occasional alcohol use.  He lives in [place].   MEDICATIONS:  1. Allopurinol 200 mg daily.  2. Aspirin 81 mg daily.  3. Flomax 0.4 mg daily.  4. Imdur 30 mg daily.  5. Lasix 20 mg daily.  6. Lipitor 40 mg q.h.s.  7. Colace 100 mg as needed.  8. Amaryl 4 mg daily.  9. Celexa 40 mg daily.  10.Toprol XL 25 mg daily.  11.Seroquel 100 mg at bedtime.  12.Ambien 10 mg at bedtime p.r.n.  13.Nicotine patch.  14.Ultram 50 mg 1 to 2 every 4 hours as needed for pain.   ALLERGIES:  HE IS ALLERGIC TO:  1. IVP DYE.  2. ERYTHROMYCIN.  3. PENICILLIN.  4. IODINE.   REVIEW OF SYSTEMS:  As per stated in the HPI.   PHYSICAL EXAM:  Temperature 97.  Blood pressure 160/85.  Pulse of 79.  Respirations 20.  Pulse ox 96% on room air.  HEENT:  Normocephalic, atraumatic.  Pupils reactive to light.  No oral  erythema.  CARDIOVASCULAR:  Regular rate and rhythm.  LUNGS:  Clear bilaterally.  ABDOMEN:  Positive bowel sounds.  EXTREMITIES:  No edema.  NEURO EXAM:  He does have some  weakness and numbness on the left side  compared to the right.  Strength is about 3 to 4/5 on the left upper and  lower extremity.   LABS:  Cardiac enzymes, CK myoglobin 226, MB 7.5, troponin 0.05.  Head  CT with no acute abnormality.  Chest x-ray, no acute abnormality.   ASSESSMENT AND PLAN:  1. Chest pain:  Patient has been seen by cardiology.  We will do      cardiac enzymes, 2D echo.  The patient is on an aspirin a day.  2. Left-sided weakness:  Neurology consulted.  Patient scheduled for      MRI/MRA.  He passed a swallow evaluation.  Also, scheduled for      carotid Dopplers, transcranial Doppler.  Check homocystine level.  3. Diabetes.  Continue home medication and a sliding scale insulin.  4. Insomnia.  We will continue Ambien and Seroquel.  5. Chronic pain.  We will continue his Ultram as needed.  6. Hypertension.  Blood pressure is elevated.  We will continue his      home medications.  7. Weight loss:  With his history of renal cell cancer, we will get a      CT of the chest, abdomen, and pelvis.  We will also get a      urinalysis.      Ladell Pier, M.D.  Electronically Signed      NJ/MEDQ  D:  10/02/2007  T:  10/03/2007  Job:  [id number]

## 2011-01-18 NOTE — Discharge Summary (Signed)
[name], [name] NO.:  [id number]   MEDICAL RECORD NO.:  [id number]          PATIENT TYPE:  INP   LOCATION:  4711                         FACILITY:  [facility]   PHYSICIAN:  Hettie Holstein, D.O.    DATE OF BIRTH:  May 22, 1941   DATE OF ADMISSION:  10/02/2007  DATE OF DISCHARGE:  10/03/2007                               DISCHARGE SUMMARY   PRIMARY CARE PHYSICIAN:  Dr. Rogelio Chambers.   VASCULAR SURGEON:  Dr. Hart Chambers.   FINAL DIAGNOSIS:  1. Severe peripheral vascular disease and ischemic pain with occluded      left fem-pop bypass with plans to followup with Dr. Hart Chambers in 2-3      weeks.  2. Diabetes mellitus uncontrolled.  His hemoglobin A1c was 6.5.  3. Coronary artery disease with a cardiomyopathy and ejection fraction      of 25%.  4. History of L1 compression fracture.   MEDICATIONS ON DISCHARGE.:  1. Nicotine patch daily, 21 mg.  2. Ultram 50 mg daily.  3. Morphine sulfate sustained release 30 mg b.i.d.  4. MSIR 30 mg q. 3 hours as needed for breakthrough pain.  5. Sonata 5 mg one or two each night only as needed for insomnia.  6. Senokot twice daily, hold if diarrhea.   DISPOSITION:  Mr. [name] was felt to be medically stable for transition  home.  He underwent consultation with palliative medicine and was  instructed to follow up with Dr. Hart Chambers in the outpatient setting.   STUDIES PERFORMED:  The patient's ABI revealed ABI of 0.79 on the right  and 0.30 on the left with these results reviewed by Dr. Hart Chambers as well  as a known occluded left fem-pop bypass graft.   HISTORY OF PRESENTING ILLNESS:  For full details please refer to the H&P  as dictated by Dr. Olena Chambers However, briefly, Mr. [name] is a 70 year old  male who presents to the emergency department secondary to severe pain  in his left lower extremity.  He has chronic pain of the left lower  extremity secondary to severe peripheral vascular disease in addition to  diabetic neuropathy.  He had been  evaluated in the past by Dr. Hart Chambers.  He had a left fem-pop bypass graft done back in November and reevaluated  by CVTS, noted that the graft was occluded.  The  patient was admitted and underwent vascular studies as described above  in addition to consultation with CVTS.  He was placed on morphine PCA,  additionally transitioned to oral narcotics and achieved adequate pain  control and was agreeable to follow up with his vascular surgeon in the  outpatient setting.  He was medically stable for discharge.      Hettie Holstein, D.O.  Electronically Signed     ESS/MEDQ  D:  11/08/2007  T:  11/08/2007  Job:  [id number]

## 2011-01-18 NOTE — H&P (Signed)
[name], [name]               ACCOUNT NO.:  [id number]   MEDICAL RECORD NO.:  [id number]          PATIENT TYPE:  INP   LOCATION:  A204                          FACILITY:  [facility]   PHYSICIAN:  Corrie Mckusick, M.D.  DATE OF BIRTH:  April 24, 1941   DATE OF ADMISSION:  01/25/2007  DATE OF DISCHARGE:  LH                              HISTORY & PHYSICAL   ADMISSION DIAGNOSIS:  Peripheral vascular disease, edema and coronary  artery disease.   HISTORY OF PRESENT ILLNESS:  This is a 70 year old gentleman with  longstanding history of coronary artery disease status post CABG in  2002, status post nephrectomy for renal carcinoma, history congestive  heart failure, history of peripheral vascular disease, renal  insufficiency and diabetes and gout who presents with bilateral lower  extremity edema.  He was seen emergency department last night with  actually left lower extremity edema which seems to have improved today  and it was thought that he had DVT.  He had a D-dimer 0.9 so ultrasound  was set up today and Lovenox was given last evening.  Interestingly  enough the venous ultrasound was negative today but arterial studies  were not done.   He came in the office this afternoon his left leg was actually less  swollen. His right leg is now swollen 2/3 up the pretibial area 1+ to 2+  pitting edema. Both legs are very ruddy read with even some dark  blueness of the feet.  Really difficult to palpate pulses although I  felt maybe a thready pulse at the DP and PT.  This is about baseline for  him.  Both legs pretty tender. Luckily left leg seems to be in less  pain.  He really reports no cardiovascular symptomatology such as chest  pain, shortness of breath or dyspnea.  Really the lower extremity pain  is the issue.   Good discussion with Dr. Regino Schultze and I decided to go ahead and put him  in the hospital versus try to work this up from an outpatient  standpoint.  We worried about his arterial  supply will set this up for  the morning.   PAST MEDICAL HISTORY:  Coronary disease status post CABG, renal  nephrectomy, renal cancer, CHF, PAD, renal insufficiency, diabetes,  gout.   PAST SURGICAL HISTORY:  Is nephrectomy as stated in 2001, MI and bypass  in 2002, and repeat bypass in 2004, cholecystectomy.   SOCIAL HISTORY:  He continues to smoke.  No alcohol.  He is retired from  [employer]   FAMILY HISTORY:  Noncontributory.   MEDICATIONS ON ADMISSION:  1. Ambien 10 mg at night.  2. Actos 15 daily.  3. Allopurinol 300 daily.  4. Amiodarone 200 mg b.i.d.  5. Aspirin 325 daily.  6. Cymbalta 20 mg p.o. b.i.d.  7. Flomax 0.4 q.h.s.  8. Isosorbide 30 daily.  9. Lipitor 40 daily.  10.Lisinopril 10 mg b.i.d.  11.Seroquel 25 mg b.i.d.  12.Amaryl 2 mg daily.   ALLERGIES:  PENICILLIN, ERYTHROMYCIN, and CT DYE.   PHYSICAL EXAM ON ADMISSION:  VITAL SIGNS:  Temperature 98.3, pulse 80,  respirations are 18, blood pressure 150/90, unable to weight. When I saw  he was in pain but talkative to [name].  HEENT: Nasopharynx clear.  NECK: Thick, supple.  No JVD.  CHEST: Clear to auscultation.  CARDIOVASCULAR:  Regular rate and rhythm with soft S1-S2. No murmurs.  ABDOMEN:  Soft, nontender.  EXTREMITIES: As stated in the HPI they are both ruddy red, right being  quite a bit more swollen than the left. Pulse is as stated above. Please  see HPI for details.   LABS:  All pending.   ASSESSMENT/PLAN:  70 year old gentleman with coronary artery disease,  renal carcinoma status post nephrectomy, congestive heart failure,  peripheral vascular disease, renal insufficiency, diabetes and gout who  presents with lower extremity edema.   PLAN:  1. Put him into 2A for close monitoring in telemetry.  2. Start him on Lovenox 1 mg/kg subcu q.12 hours.  3. Elevate legs.  4. Diuresis with Lasix 40 IV q.8 hours.  5. Dilaudid 2 mg IV q.6 h for pain.  6. [facility].  7. CBC, Chem-12 on admission in the morning.  8. Need lower extremity arterial studies in the morning, CPK-MB,      troponins were done in the ER last night were negative and again      venous Doppler was done today at 3 o'clock which was negative. We      will continue follow closely with Dr. Regino Schultze.      Corrie Mckusick, M.D.  Electronically Signed     JCG/MEDQ  D:  01/25/2007  T:  01/25/2007  Job:  [id number]

## 2011-01-18 NOTE — Consult Note (Signed)
[name], COVAULT NO.:  [id number]   MEDICAL RECORD NO.:  [id number]          PATIENT TYPE:  IPS   LOCATION:  4037                         FACILITY:  [facility]   PHYSICIAN:  Antonietta Breach, M.D.  DATE OF BIRTH:  [birth year]   DATE OF CONSULTATION:  08/22/2008  DATE OF DISCHARGE:                                 CONSULTATION   [name] has improved mood today.  No thoughts of hopelessness.  He is  encouraged that his shoulders feel stronger and he is wheeling his  wheelchair down the hall effectively.   He does not have any thoughts of harming himself or others.  He has no  delusions or hallucinations.   His memory and orientation function are intact.  He is able to  participate in his therapy on the rehabilitation floor.   REVIEW OF SYSTEMS:  GASTROINTESTINAL:  No nausea or loose stools  associated with the Zoloft.  He reports still having very disrupted and  poor sleep.   This morning he did receive 50 mg of the tramadol.  He did receive 50 mg  of trazodone last night at bedtime.   Case was discussed with staff.   The record was reviewed.   EXAMINATION:  VITAL SIGNS:  Temperature 98.5.  Pulse 85.  Respiratory  rate 20.  Blood pressure 91/59.  O2 saturation on room air 96%.   MENTAL STATUS EXAM:  [name] has intact eye contact.  His attention  span is mildly decreased.  Concentration mildly decreased.  Affect is  mildly flat.  Mood is mildly depressed.  He is oriented to all spheres.  His memory function is intact to immediate, recent, and remote.  Speech  is involving normal rate and prosody without dysarthria.  Thought  process is logical, coherent, goal directed.  No looseness of  associations.  Thought content, no thoughts of harming himself or  others, no delusions, no hallucinations.  Insight is intact.  Judgment  is intact.   ASSESSMENT:  AXIS I:  1. 293.84 anxiety disorder, not otherwise specified (general medical      factors along with  posttraumatic stress disorder).  2. 293.83, mood disorder, not otherwise specified (idiopathic and      general medical factors), depressed.  3. 296.33, major depressive disorder, recurrent, severe.   RECOMMENDATIONS:  1. Would continue the titration of the Zoloft by 25 mg every 2 days to      the target dose of 100 mg daily.  Would also continue to titrate      the Remeron by 7.5 mg per h.s. to the target dose of 30 mg q.h.s.      for anti-depression and augmenting the Zoloft and anti-depression      with the Zoloft being the main antianxiety medication.  2. Regarding his insomnia, would adjust the trazodone by 25 to 50 mg      q.h.s. until his sleep is normal, not to exceed 300 mg q.h.s.  3. Continue ego support.   PRELIMINARY DISCHARGE PLANNING:  1. Would ask the social worker to set this patient up with an  outpatient psychiatric and counseling followup in the [facility] in [place].  2. While increasing the Zoloft, would monitor for loose stools or      nausea.  3. The Remeron should help block side effects of Zoloft because it has      anti-5-HT2 and anti-5-HT3 (anti-nausea) effects.  4. Also, as the Remeron is increased the need for trazodone may be      decreased given a sedative side effect of Remeron.  5. The sedated side effect of Remeron, however, does tend to wane once      30 mg is reached due to the alpha II blockade which increases      interest synaptic norepinephrine tone.      Antonietta Breach, M.D.  Electronically Signed     JW/MEDQ  D:  08/22/2008  T:  08/22/2008  Job:  [id number]

## 2011-01-18 NOTE — Discharge Summary (Signed)
NAMEKAIEN, PEZZULLO               ACCOUNT NO.:  [id number]   MEDICAL RECORD NO.:  [id number]          PATIENT TYPE:  INP   LOCATION:  2027                         FACILITY:  [facility]   PHYSICIAN:  Juleen China IV, MDDATE OF BIRTH:  05-04-1941   DATE OF ADMISSION:  08/11/2008  DATE OF DISCHARGE:  08/19/2008                               DISCHARGE SUMMARY   ADMISSION DIAGNOSIS:  Ischemic left leg.   FINAL DISCHARGE DIAGNOSIS:  1. Ischemic left leg, status post left below-knee amputation.  2. Peripheral vascular disease with history of left femoral-popliteal      artery bypass 8 years ago in [place], which has since occluded.  3. Diabetes mellitus type 2, poorly controlled per the patient.  4. Coronary artery disease with history of myocardial infarction with      chest pain this admission with negative cardiac enzymes.  5. Ischemic cardiomyopathy.  6. History of congestive heart failure.  7. Diabetic neuropathy.  8. Postoperative hypokalemia, supplemented.  9. Postoperative confusion, improved.  10.Acute renal insufficiency with admission creatinine at 2.0,      dehydration felt to be contributing factor, creatinine now normal      at 0.8.   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   CONSULTS:  1. Physical Therapy, Occupational Therapy, Rehab Medicine, and      diabetes educator.  2. Smoking cessation consult.   PROCEDURES:  On August 12, 2008, he underwent left below-knee  amputation by Dr. Venida Jarvis.   HISTORY:  Mr. Guyton is a 70 year old male who presented to the [facility]  office several months ago with left leg ulcer.  He did undergo an  arteriogram on July 24, 2008, and was found to have diseased tibial  vessels.  He had a history of the left femoral to below-knee popliteal  artery bypass in [place] approximately 8 years prior since occluded.  He has  significant cardiovascular history and was felt at high risk for  operative bypass from a cardiac standpoint.  Also, based on the severity  of the ulcer with tendon involvement, Dr. Myra Gianotti did not feel that limb  salvage was an option for him.  Subsequently, a below-knee amputation  was recommended.   HOSPITAL COURSE:  Mr. Enberg was admitted to [facility] on  August 11, 2008, and was admitted to telemetry unit 2000 and admitted  for pain control and IV antibiotics with plans for him to undergo a left  below-knee versus above-knee amputation on August 12, 2008, and the  patient was taken to the operating room on August 12, 2008, and did  undergo a left below-knee amputation.  He was started on Dilaudid PCA  postoperatively.  PT, OT and Rehab consults were requested to assist  with mobility.  For the first 2 days, he had pain control issues.  He  ultimately developed an altered mental status changes, which were felt  related to his pain medication regimen.  His narcotics were placed on  hold, but ultimately we were able  to resume his Percocet.  Of note, he  was also worked up for possible early sepsis just to rule out and this  was not contributing to mental status changes.  Of note, it was felt  that he had approximately 50% chance of healing his below-knee  amputation secondary to his peripheral vascular disease.  However, we  felt best to give healing a chance.  He was started on some IV Ancef as  there are some small scattered areas along his incision site that showed  marginal healing, but overall his amputation site is felt stable at this  point.  His blood cultures have showed no growth to date and currently  has a decreasing white count at 11.2.  His hemoglobin is 9.8, hematocrit  29.6, and platelet count of 554.  Sodium 132, potassium 3.2 which was  supplemented, and glucose of 96.  Creatinine of 0.8 and BUN of eight.  Of note, at admission, creatinine and BUN were  elevated at 2.0 and 32  respectively, these normalized, however, with some hydration.   Initially, due to his altered mental status postoperatively, he did not  make good progress with PT and OT and they felt he might actually need a  skilled nursing facility.  However, once his mental status improved,  they felt he would be a good candidate for inpatient rehab and at this  point they felt he is a good candidate as well and should have a bed  available for him on August 19, 2008.  At the time of this dictation,  he remains hemodynamically stable and afebrile, oxygen saturation 94% on  room air.  He was maintained sinus rhythm.  He did have one episode of  chest pain, but we did 3 sets of cardiac enzymes which were all  negative.  He is tolerating regular food.  However, we have not had him  on his diabetic medication regimen due to relatively normal blood  glucose levels.  We have started him on NovoLog insulin sliding scale,  however.  He has requested a formal diabetes education and a consult has  been requested as well as a hemoglobin A1c which is still pending.  We  have also requested a tobacco cessation consult, which is still pending  as well.   DISPOSITION:  Currently, Mr. Thall remains in stable and improving  condition.  If there are no significant changes status, we anticipate  that he will be ready for discharge to [facility] on August 19, 2008.   DISCHARGE MEDICATIONS:  1. Flomax 0.4 mg 1 p.o. b.i.d.  2. Simvastatin 40 mg 1 p.o. nightly.  3. Metoprolol ER 25 mg 1 p.o. daily.  4. Potassium chloride 10 mEq 1 p.o. b.i.d.  5. Furosemide 40 mg 1 p.o. b.i.d.  6. Carvedilol 12.5 mg 1 p.o. b.i.d.  7. Allopurinol 300 mg 1 p.o. daily.  8. Multivitamin daily.  9. Ramipril 2.5 mg p.o. b.i.d.  10.Lyrica 75 mg p.o. t.i.d.  11.Endocet 7.5/325 mg 1 tablet p.o. q.3-4 h p.r.n. pain  12.Seroquel 100 mg 3 tablets p.o. nightly.  13.Ambien 10 mg 1 p.o.  nightly p.r.n. for sleep.  14.Colace 100 mg p.o. daily.  15.Lovenox 40 mg subcutaneously nightly.  16.NovoLog insulin sliding scale moderate coverage for q.a.c. and at      bedtime Accu-Cheks.  17.He was on Actos 50 mg p.o. daily and Amaryl 4 mg p.o. q.p.m. on      admission, but these have been placed  on hold as his blood sugars      have been well controlled postoperatively.   DISCHARGE INSTRUCTIONS:  Continue a diabetic appropriate diet.  Increase  activity as tolerated.  PT and OT to continue daily treatments as  appropriate.  His incision should be cleaned daily gently with soap and  water, cover with a dry gauze dressing and Ace wrap.  We will continue  to periodically monitor his progress while in Rehab.  Again, he does  have some small areas of marginal skin healing at amputation site, but  at this point it is felt acceptable.  We will continue to follow this to  note for any changes such as erythema, purulent drainage, fevers, or  elevated white count.  At this point, we will continue him on  antibiotics.  Once he is closer to discharge home, we will schedule  outpatient followup with Dr. Venida Jarvis and then he will go  back up to discharge medication and just add Ancef 1 g IV q.8 h.  (started on August 15, 2008).      Jerold Coombe, P.A.      Jorge Ny, MD  Electronically Signed    AWZ/MEDQ  D:  08/18/2008  T:  08/19/2008  Job:  [id number]   cc:   Jorge Ny, MD  Kirk Ruths, M.D.  Gerrit Friends. Dietrich Pates, MD, [facility]  Hettie Holstein, D.O.  Deanna Artis. Sharene Skeans, M.D.  Patrica Duel, M.D.

## 2011-01-18 NOTE — Assessment & Plan Note (Signed)
OFFICE VISIT   [name], DIN  DOB:  05/22/1941                                       11/24/2008  NUUVO#:[id number]   HISTORY:  This is a 70 year old gentleman who is status post left below  knee amputation for nonhealing wound which did not heal and required  conversion to an above-knee amputation.  This was performed on September 26, 2008.  He comes in today for continued followup.  He has now been  fitted for a temporary prosthesis with plans for getting a permanent  prosthesis next week.  He is walking well and participating aggressively  in physical therapy.  He is off narcotics for his pain.  He does  complain of some numbness and coldness in his right foot.   PHYSICAL EXAMINATION:  He is afebrile.  His amputation site is well-  healed.  He has a palpable dorsalis pedis pulse in the right leg.   ASSESSMENT:  Status post left leg amputation.   PLAN:  The patient will continue with his physical therapy.  We  discussed the importance of diabetic management.  I told I think that  the numbness and temperature changes in his right foot are likely  secondary to neuropathy.  We had a duplex performed several months ago  which shows an ankle brachial index of 1.  That in addition to palpable  pulses, I think this is more likely a neuropathy.  He does not have any  open wounds at this time.   I plan on seeing the patient back in 1 year.  We will repeat his ankle  brachial indices.  He will call me in the interval if he has any other  questions.   Jorge Ny, MD  Electronically Signed   VWB/MEDQ  D:  11/24/2008  T:  11/25/2008  Job:  [id number]   cc:   Kirk Ruths, M.D.  Gerrit Friends. Dietrich Pates, MD, [facility]  Patrica Duel, M.D.

## 2011-01-18 NOTE — Assessment & Plan Note (Signed)
OFFICE VISIT   [name], [name]  DOB:  [birth year]                                       07/21/2008  ZOXWR#:[id number]   REASON FOR VISIT:  Left leg ulcer.   PRIMARY CARE PHYSICIAN:  Patrica Duel, M.D.   HISTORY:  This is a 70 year old gentleman who I am seeing for evaluation  of the left leg ulceration and leg pain.  The patient states for the  past 6 months he has been having left leg pain.  He has had an open  wound there for approximately 1 month, which he is doing dressing  changes on routinely.  He does have a history of what he describes as  left femoral popliteal bypass graft performed approximately 8 years ago  in [place].   The patient was recently hospitalized for an episode of hypoglycemia and  altered mental status.  He had a duplex ultrasound a year ago which  showed bypass graft to be occluded.  The patient has multiple medical  problems including coronary artery disease, ischemic cardiomyopathy,  congestive heart failure.  He is status post cardiac bypass.   REVIEW OF SYSTEMS:  GENERAL:  Positive for weight loss, loss of  appetite.  CARDIAC:  Positive for chest pain and pressure, shortness of breath.  PULMONARY:  On home oxygen.  Positive for wheezing.  GI:  History of black stools with constipation.  GU:  Positive for kidney disease.  VASCULAR:  Pain in legs with walking and lying flat as well as  nonhealing ulcers.  NEURO:  Positive dizziness and blackouts.  ORTHO:  Positive for muscle pain.  PSYCH:  Positive for nervousness.  ENT:  Positive for bleeding problems.   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   FAMILY HISTORY:  Positive for cardiovascular disease in his brother and   sister.   SOCIAL HISTORY:  Married with 2 children.  He is retired [employer]  Currently smokes about half a pack a day.  Does not  drink alcohol.   MEDICATIONS:  Include gabapentin, Seroquel, simvastatin, metoclopramide,  Coreg, diazepam, potassium, OxyContin, Lasix, Halcion, Flexeril,  Lunesta, Flomax, Protonix Amaryl, Actos, allopurinol, Toprol, ramipril,  aspirin, Fentanyl patch.   ALLERGIES:  IV dye, penicillin and erythromycin.   PHYSICAL EXAMINATION:  Vital signs are not recorded today.  General:  He  is well appearing in mild distress secondary to pain.  HEENT:  He is  normocephalic, atraumatic.  Pupils equal.  Sclerae anicteric.  Neck:  Supple.  No JVD.  Cardiovascular:  Regular rate and rhythm.  Pulmonary:  Respirations are nonlabored.  Abdomen is obese, soft.  Extremities:  The  left leg, is pale in appearance.  There are multiple ulcerated areas on  the dorsum of his foot as well as the lateral part of his heel.  He can  move his toes.  Sensation begins at the level of his ankle.   ASSESSMENT/PLAN:  Left leg ulcer.   PLAN:  The patient has extensive advanced peripheral vascular disease.  He has a history of bypass graft which is known to be occluded.  He does  have tissue loss on  his leg as well as severe pain.  He is at high risk  for limb loss.  I have told the patient that our first course of action  needs to define his circulation.  We will begin proceeding with an  arteriogram.  This will be scheduled this Thursday, November, 19th.  I  will plan on accessing the right side and studying the left leg.  If he  has anything that could be intervened on, we would proceed at that time.  Based on findings of arteriogram, we will need to discuss what our  options, if any, exist for revascularization.  Otherwise, given the  severity of his pain, I think he would benefit from proceeding with a  primary amputation.  We discussed this with the patient and  his wife.  We will make further recommendations based on the outcome of his  arteriogram.   Jorge Ny, MD  Electronically Signed   VWB/MEDQ  D:  07/21/2008  T:  07/22/2008  Job:  [id number]   cc:   Gerrit Friends. Dietrich Pates, MD, [facility]  Patrica Duel, M.D.
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
## 2011-01-18 NOTE — Discharge Summary (Signed)
[name], FORMISANO               ACCOUNT NO.:  [id number]   MEDICAL RECORD NO.:  [id number]          PATIENT TYPE:  INP   LOCATION:  2019                         FACILITY:  [facility]   PHYSICIAN:  Mobolaji B. Bakare, M.D.DATE OF BIRTH:  20-Feb-1941   DATE OF ADMISSION:  07/14/2007  DATE OF DISCHARGE:  07/18/2007                               DISCHARGE SUMMARY   PRIMARY CARE PHYSICIAN:  Kirk Ruths, M.D.   FINAL DIAGNOSES:  1. Acute respiratory failure,  likely multifactorial secondary to      acute pulmonary edema, chronic obstructive pulmonary disease,      probable pneumonia.  2. Elevated troponin.  The patient will need followup Myoview study on      July 27, 2007 by Dr. Lynnea Ferrier with [facility] and      Vascular.  3. Acute pulmonary edema.  4. Cardiomyopathy with ejection fraction of 25%.  5. Chronic obstructive pulmonary disease.  6. Probable pneumonia.  7. Mood disorder.  8. Anxiety disorder.   SECONDARY DIAGNOSES:  1. Tobacco abuse.  2. Hyperlipidemia.  3. Diabetes mellitus.  4. Obesity.  5. Muscular weakness.  She needs follow-up with Dr. Sharene Skeans  in the      outpatient setting.  This was previously arranged prior to      hospitalization.  6. L1 compression fracture, status post kyphoplasty.  7. Coronary artery disease with history of Coronary artery bypass      grafting in 2004.   PROCEDURES:  1. A 2-D echocardiogram done on  July 15, 2007 showed overall left      ventricular systolic function to be markedly decreased with      estimated ejection fraction of 25%.  There was mild diffuse left      ventricular hypokinesis and akinesis of the mid distal      anterolateral wall, severe hypokinesis of the periapical wall.  2. A CT angiogram of the chest done on July 15, 2007 was negative      for pulmonary embolism.  There were diffuse interstitial      infiltrates consistent with pulmonary edema, tiny bilateral pleural      effusions.  3. A  CT abdomen and pelvis showed no acute findings, evidence of      previous nephrectomy noted.  4. Chest x-ray done July 14, 2007 showed increased bibasilar      opacity compatible with atypical infection.   CONSULTATIONS:  1. Pulmonary and critical care consult provided by Dr. Molli Knock .  2. Cardiology consult provided by Dr. Lynnea Ferrier.  3. Psychiatric consult provided by Dr. Jeanie Sewer.   BRIEF HISTORY:  Please refer to the admission H&P for full details.  Mr.  [name] is a 70 year old Caucasian male who was admitted with acute onset  of shortness of breath which was associated with diaphoresis and  palpitation.  There was no chest pain or syncope.  On initial evaluation  in the emergency room, he had a blood pressure of 170/104, respiratory  rate of 22 and pulse of 122.  Rhythm appears to be sinus tachycardia.  Initial concern was for pulmonary embolism  in view of elevated D-dimer.  He obtained a CT angiogram of the chest which was negative for PE.  It  did confirm that his interstitial process was compatible with pulmonary  edema.  He had an elevated BNP of 262.  The patient was admitted for  further evaluation.  He was admitted into critical care unit.  Cardiology and pulmonary and critical care specialists were consulted.   HOSPITAL COURSE:  The patient was started on BiPAP from the emergency  room he improved with BiPAP.  He did not need to be intubated.  The  patient was also empirically started on antibiotics.  Given concern for  possible pneumonia on chest x-ray probable atypical pneumonia.  He had  no fever during the course of hospitalization and white cell count  remained normal antibiotic was discontinued prior to discharge.   The patient was started on IV Lasix upon admission he responded well to  diuretics at the time of discharge his lungs sounds clear.  He as no  calf edema.  Lasix was reduced to 20 mg daily.  He had a 2-D  echocardiogram with results is as noted above.   He has ejection fraction  of 25%.  He has had a bump in his troponin.  He was seen by cardiology.  He has been arranged to have an outpatient Myoview study on July 27, 2007.  He is to follow up with Dr. Lynnea Ferrier.  It is noted that the  patient has history of CAD and the bypass surgery in 2004.   The patient has significant history of smoking.  Imaging study was sent  suggestive of chronic bibasilar by basilar opacities and given the acute  onset of shortness of breath, concern was also for COPD.  He was started  on steroid and this has been tapered at the time of discharge.  The  patient was offered smoking cessation counseling and received the  nicotine patch.   The patient was seen in consultation by Dr. Jeanie Sewer for anxiety and  mood disorder.  He recommended outpatient follow-up with his  psychiatrist.  The patient was continued on Celexa.  He was treated with  Xanax.  At the time of discharge, Xanax was changed to p.r.n.  Anxiety  is better controlled at the time of discharge.   Diabetes mellitus.  The patient was on Amaryl and Actos prior to  hospitalization.  Given the low ejection fraction and acute  pulmonary  edema, Actos was discontinued.  Amaryl been increased from  2 mg daily  to 4 mg daily.  Further optimization of treatment will be done by his  primary care physician.  Hemoglobin A1c done on the November 3 was 6.   DISCHARGE MEDICATIONS:  1. Allopurinol 300 mg daily.  2. Aspirin 81 mg daily.  3. Lipitor 40 mg daily.  4. Colace 100 mg three times daily.  5. Cymbalta 20 mg two times daily.  6. Duragesic patch 72 mcg every 72 hours.  7. Amaryl 4 mg daily.  8. Imdur 30 mg daily.  9. Lisinopril 5 mg b.i.d.  10.MiraLax 17 grams p.r.n.  11.Seroquel 200 mg at bedtime.  12.Celexa 40 mg daily.  13.Lasix 20 mg daily.  14.Metoprolol 25 mg daily.  15.Nicotine patch 21 mg daily.  16.Prednisone taper.  17.Xanax 4.5 mg b.i.d. p.r.n.  18.Flomax 4.4 mg daily.  19.Halcion  0.2 mg daily at bedtime.  20.Dilaudid p.r.n.  21.Ambien p.r.n.   FOLLOW UP:  1. Persantine stress test on  July 27, 2007 at 8:15 a.m.  2. Dr. Lynnea Ferrier on July 31, 2007.   FOLLOW UP:  Follow up with Dr. Regino Schultze in 2-3 days and check BMET.  The  patient to follow up at outpatient psychiatric clinic.   DIET:  Fluid restriction 1500 mL a day.  A 2 gram low salt, heart-  healthy diet.  Instructions given to the patient to weigh himself three  times a week and show records to his physicians.   DISCHARGE LABORATORY DATA:  Sodium 135, potassium 3.7, chloride 104,  bicarb 26, BUN 26, creatinine 1.22, glucose 165, calcium 8.3.  White  cell 8.4, hemoglobin 12.4, hematocrit 37.5 platelets 261,000.      Mobolaji B. Corky Downs, M.D.  Electronically Signed     MBB/MEDQ  D:  07/18/2007  T:  07/19/2007  Job:  [id number]   cc:   Ritta Slot, MD  Kirk Ruths, M.D.

## 2011-01-18 NOTE — Discharge Summary (Signed)
NAMEROGEN, PORTE               ACCOUNT NO.:  [id number]   MEDICAL RECORD NO.:  [id number]          PATIENT TYPE:  INP   LOCATION:  3712                         FACILITY:  [facility]   PHYSICIAN:  Herbie Saxon, MDDATE OF BIRTH:  1941-06-07   DATE OF ADMISSION:  07/09/2007  DATE OF DISCHARGE:  07/11/2007                               DISCHARGE SUMMARY   INTERVENTIONAL RADIOLOGIST:  Dr. Corliss Skains   PROCEDURES:  Kyphoplasty with biopsy on July 10, 2007.   RADIOLOGY:  The bone scan of July 10, 2007 showed L1 vertebral  increased tracer uptake secondary to a compression fracture, status post  vertebral body augmentation for painful osteoporotic compression  fracture at L1 using balloon kyphoplasty on July 10, 2007.   HOSPITAL COURSE:  This is a 70 year old gentleman who was admitted with  severe back pain for the last three months associated with weight loss,  muscle wasting and weakness in the extremities, was a admitted for  kyphoplasty.  The patient has been able to improve his ambulation, back  pain is much ameliorated after the procedure, however he does have  progressive limb weakness and will be benefited from outpatient  neurology evaluation with nerve conduction velocity and electromyography  to rule out any progressive neuromuscular disorder.  His glycemic  control will be further optimized by increasing his dose of Actos to 30  mg daily and patient is also going to benefit from home physical  therapy.   DISCHARGE CONDITION:  Stable.   ACTIVITY:  Increase slowly as tolerated, ambulate with a walker.   DIET:  Heart Healthy, low salt, low cholesterol.   DISCHARGE DIAGNOSES:  1. L1 compression fracture.  2. Osteoporosis.  3. History of coronary artery disease status post coronary artery      bypass grafting.  4. Peripheral vascular disease status post left femoral-popliteal      bypass.  5. Diabetes type 2 with peripheral neuropathy.  6. Hypertension.  7. Hyperlipidemia.  8. History of kidney cancer status post left nephrectomy.  9. Query progressive neuromuscular disorder.  10.History of psych disorder.  11.Mild hyponatremia.   MEDICATIONS ON DISCHARGE:  Include:  1. Allopurinol 300 mg daily.  2. Aspirin 81 mg daily.  3. Lipitor 20 mg daily.  4. Colace 100 mg t.i.d.  5. Cymbalta 20 mg b.i.d.  6. Duragesic patch 72 mcg q.72 hours.  7. Amaryl 2 mg daily.  8. Imdur 30 mg daily.  9. Lisinopril 10 mg b.i.d.  10.Actos 30 mg daily.  11.MiraLax 17 gm p.r.n.  12.Seroquel 200 mg q.h.s.  13.Flomax 0.4 mg daily.  14.Halcion 0.25 mg q.h.s.  15.Dilaudid 2 mg q.6 hours p.r.n.  16.Ambien 10 mg q.h.s. p.r.n.   PHYSICAL EXAMINATION:  GENERAL:  On examination today, he is an elderly  man in no acute distress.  VITAL SIGNS:  Temperature is 98, pulse is 72, respiratory rate is 18,  blood pressure 140/80.  HEENT:  Pupils equal, reactive to light and accommodation.  Head is  atraumatic, normocephalic. Mucous membranes are moist.  NECK:  Supple.  There is no thyromegaly, no elevated JVD, no carotid  bruit.  CHEST:  Clinically clear.  Heart sounds 1 and 2 regular.  No murmurs.  ABDOMEN:  Soft, nontender.  Bowel sounds normoactive.  NEUROLOGICAL:  He is alert and oriented x3.  He has moderate muscle  wasting biceps, triceps and the calf muscles.  Peripheral pulses  present, no pedal edema.  Power is 4+ upper limbs, 2+ his bilateral  lower limbs.  Deep tendon reflexes 2+ globally.  Mood is stable.   Labs showed WBC of 8, hematocrit 42, platelet count is 318.  Chemistry  shows a sodium 133, potassium 4.9, chloride 96, bicarbonate 26, glucose  134, BUN 13, creatinine 1.4.  AST 21, ALT 19.  Troponin 0.01.  Hemoglobin A1c 6.0.  TSH 2.8.  Urine culture negative so far.   PLAN:  The patient is to have basic metabolic panel as outpatient.  Follow up with Dr. Regino Schultze, of [facility], as primary care  physician, in three to five days.  Follow up  with Dr. Sharene Skeans,  neurology, in one to two weeks.   DISPOSITION:  Home with home physical therapy.      Herbie Saxon, MD  Electronically Signed     MIO/MEDQ  D:  07/11/2007  T:  07/11/2007  Job:  [id number]

## 2011-01-18 NOTE — Assessment & Plan Note (Signed)
OFFICE VISIT   RAND, ETCHISON  DOB:  May 19, 1941                                       09/23/2008  ZOXWR#:[id number]   The patient had an amputation, left below-the-knee, performed by Dr.  Myra Gianotti on December 13th for an ischemic left foot.  He had considered  above-knee amputation at the time but felt that it was worth an attempt  to try below-knee amputation, according to his notes.  The patient was  seen on January 11th by Dr. Myra Gianotti and the stump appeared to be healing  adequately.  He has had increasing pain over the past several days as  well as some odor and drainage from the stump, which has all started  since Dr. Myra Gianotti saw the patient on the 11th.  On examination today, he  has extensive skin necrosis at the below knee amputation site on the  left with some fat necrosis surrounding this and some mild erythema.  I  spoke with the patient for 20-25 minutes, including him and his wife,  trying to convince him to proceed with a left above-knee amputation  because of his ischemic necrosis and the fact that he is diabetic.  He  will not agree to this at this time but will think about it.  I offered  him this Friday, January 22nd, to perform the procedure, if he will  notify us, but he is not ready to make that commitment at this time.  He  will be discharged with dressing changes today and will have an  appointment to see Dr. Myra Gianotti in the office on Monday, the 25th, unless  he changes his mind and we will schedule him at that time.   Quita Skye Hart Rochester, M.D.  Electronically Signed   JDL/MEDQ  D:  09/23/2008  T:  09/24/2008  Job:  [id number]

## 2011-01-18 NOTE — Assessment & Plan Note (Signed)
OFFICE VISIT   VONTAE, COURT  DOB:  September 15, 1940                                       08/11/2008  ZOXWR#:[id number]   Please see dictation for H&P in the hospital system.   Jorge Ny, MD  Electronically Signed   VWB/MEDQ  D:  08/11/2008  T:  08/12/2008  Job:  [id number]

## 2011-01-18 NOTE — Consult Note (Signed)
NAMESHONE, [name]               ACCOUNT NO.:  [id number]   MEDICAL RECORD NO.:  [id number]          PATIENT TYPE:  INP   LOCATION:  1439                         [facility]ACILITY:  [facility]   PHYSICIAN:  Juan-Carlos Monguilod, M.D.DATE OF BIRTH:  [birth year]   DATE OF CONSULTATION:  08/16/2007  DATE OF DISCHARGE:  08/17/2007                                 CONSULTATION   REFERRING PHYSICIAN:  Dr. Olena Leatherwood.   REASON FOR CONSULTATION:  To assist with goals of care and pain plan.   This NP reviewed the medical records, received report from the team,  assessed the patient and then met with the patient to discuss pain  history, plan and goals.   RECOMMENDATIONS/PATIENT PLAN:  1. Acute on chronic pain. I had a long discussion with the patient      about the need for an outpatient pain doctor and I also talked to      the patient about surgical intervention. At the current time, he is      denying surgical intervention.  I also talked with him about the      smoking effects on his pain and his overall goals. Greater than 50%      of this time was spent in counseling and coordination of care.  2. Consider discontinuing the morphine PCA and starting MS Contin 30      mg p.o. b.i.d. as well as MSIR 30 mg q.4 h p.r.n. I plan to follow      up tomorrow a.m. to assess p.r.n. needs and then increase MS Contin      as needed.  3. I also plan to empower the patient with a follow-up outpatient plan      and encourage him to consider surgical intervention as needed.   HISTORY OF PRESENT ILLNESS:  [name] is a 70 year old Caucasian male  who came into the hospital with increasing bilateral foot pain.  He was  recently discharged from the hospital on November 12 after being treated  for acute respiratory failure.  He has a past medical history including  respiratory failure secondary to COPD as well as depression, chronic  pain, lower extremity cellulitis, left lower extremity occluded left  femoral  popliteal bypass, renal failure and ischemic cardiomyopathy and  diabetes.  He has over the last month had increasing weakness at home as  well as increasing pain in his legs.  He is only able to walk  approximately 12 feet before he gets short of breath and has pain in his  legs.  He states that his goal is to stay as comfortable as possible. I  was asked to meet with him today to discus his pain plan as he has  required increasing pain medications and is now on a morphine PCA.   PAST MEDICAL HISTORY:  As above in HPI.   MEDICATIONS:  1. Zyloprim 30 mg p.o. daily.  2. Aspirin 81 mg p.o. daily.  3. Lipitor 40 mg p.o. q.h.s.  4. Celexa 45 mg p.o. daily.  5. Lovenox 55 mg subcu q.24 h.  6. Lasix 20 mg p.o. daily.  7. Neurontin 300 mg p.o. b.i.d.  8. Amaryl 1 mg p.o. daily.  9. NovoLog per pharmacy protocol.  10.Imdur 30 mg p.o. daily.  11.Morphine PCA full dose.   ALLERGIES:  PENICILLIN, ERYTHROMYCIN, IODINE.   SOCIAL HISTORY:  The patient lives at home with his family in [place],  [place].  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  He is married and his wife is his  primary caregiver. He continues to smoke; however, he does not drink  alcohol.  He is a Curator.   REVIEW OF SYSTEMS:  The patient states that his pain is constant.  He  describes it as stabbing in his lower legs.  He also reports chronic  pain in his lower back that radiates down the back to his left thigh.  He states that the  pain is constant however, it can intermittently be  worse at a 10/10. His baseline pain is 7/10.  He states that this pain  does improve a little bit with morphine and it only gets to about 6/10.  At 6/10, the patient is able to perform his ADLs and enjoy life. He  denies any nausea, vomiting, diarrhea.  He does report increasing  weakness and functional decline.  He states that he is primarily on the  couch at home.   PHYSICAL EXAM:   BP 128/70, heart rate 84, respirations 18, O2 sat 98% on  2 liters.  LUNGS:  Diminished bilaterally however clear.  HEART:  Rate regular.  No murmurs, rubs or gallops.  ABDOMEN:  Soft, positive bowel sounds.  EXTREMITIES:  Significant changes from PVD in his lower extremities, no  edema. Brownish tinge to the skin.   LAB DATA:  Sodium 137, potassium 3.7, BUN 16, creatinine 1.6, calcium  9.2. Hemoglobin 10.5, hematocrit 30.9, white blood cell count 5.8.      Asencion Noble, NP      Rosanne Sack, M.D.  Electronically Signed    KMJ/MEDQ  D:  08/16/2007  T:  08/17/2007  Job:  [id number]   cc:   Ladell Pier, M.D.   [facility]

## 2011-01-18 NOTE — Letter (Signed)
June 26, 2008    Patrica Duel, M.D.  [address]  [place], [place]  [address]   RE:  [name], [name]  MRN:  [id number]  /  DOB:  1941/04/09   Dear Loraine Leriche:   Mr. Bulman returns to the office for continued assessment and treatment  of ischemic cardiomyopathy.  Since his last visit, he is somewhat  improved.  Edema has decreased.  He remains somnolent due to the  multiple psychoactive drugs that he takes including 4 different narcotic  preparations.  He has dyspnea on mild exertion, which is chronic.   Medications are unchanged from his last visit except for the addition of  a Fentanyl patch, ramipril 2.5 mg b.i.d., simvastatin 40 mg b.i.d.,  carvedilol 12.5 mg b.i.d.  He notes profound and chronic weakness, but  no lightheadedness and certainly no syncope.   PHYSICAL EXAMINATION:  GENERAL:  On exam, lethargic but pleasant  gentleman in no acute distress.  VITAL SIGNS:  The weight is 247, 14 pounds less than at his last visit.  Blood pressure initially 75/50 but subsequently 100/51 taken by me.  Heart rate 85 and regular, respirations 14.  NECK:  No jugular venous distention; no carotid bruits.  LUNGS:  Decreased breath sounds at the bases; otherwise clear.  CARDIAC:  Normal first and second heart sounds; fourth heart sound  present.  ABDOMEN:  Soft and nontender; no organomegaly; hernia in the left flank.  EXTREMITIES:  1 - 2+ edema.   LABORATORY DATA:  No new laboratory available.   IMPRESSION:  Mr. Minish has improved with adjustment of his medications  for ischemic cardiomyopathy and chronic congestive heart failure.  Due  to his fairly low blood pressure, no further dose adjustments will be  undertaken today.  Laboratory studies including a chemistry profile and  lipid profile will be obtained.  I will reassess this nice gentleman in  1 month.    Sincerely,      Gerrit Friends. Dietrich Pates, MD, [facility]  Electronically Signed    RMR/MedQ  DD: 06/26/2008  DT:  06/27/2008  Job #: [id number]

## 2011-01-18 NOTE — Assessment & Plan Note (Signed)
OFFICE VISIT   [name], [name]  DOB:  Sep 18, 1940                                       09/15/2008  ZOXWR#:[id number]   REASON FOR VISIT:  Follow-up.   HISTORY:  This is a 70 year old gentleman who underwent left below the  amputation performed on August 17, 2008.  At the time his operation,  the tissue was thought to have a marginal blood supply, however, I went  ahead with a below-knee amputation in anticipation of this being able to  heal.  His postoperative course, not surprisingly has been complicated  by pain.  He has not had phantom pain but rather incisional pain.  Has a  long history of being on narcotics for pain and so he has probably  developed a high tolerance.  He has recently been given Percocet by his  primary care doctor, Dr. Dionne Ano. [name].  He comes back in today for  follow-up.  He is about 1 month out from the surgery.   On examination his staples are in place.  There is a stable reaction  with redness around the staples.  He does have some eschar however, the  wound is intact.  There is no evidence of infection.   PLAN:  With regards to his below-knee amputation,  I  am optimistic that  this will heal.  I did tell the patient today that should the wound fall  part we might need to consider operative revision, however, I would  recommend continuing to closely observe this in hopes that it will heal.   With regard to his rehabilitation he seems to be recovering very well.  He is doing exercises with physical therapy at home.  I am very pleased  with the progress he has made in this regard.   As far as pain management goes, he is going to be a challenge from this  perspective.  He does have a high tolerance due to his long history of  taking narcotics.  He needs to have one person writing him for his pain  medicine.  I have told him to pursue appointment with pain management  that they have already looked into.  I will be seeing  him less and less  in the next couple months and so, I may not be the best person writing  for his  narcotics.  However, I did  today increase his Duragesic patch  to 75 mcg every 72 hours and I gave him 50 10/325 Percocet.   Jorge Ny, MD  Electronically Signed   VWB/MEDQ  D:  09/15/2008  T:  09/16/2008  Job:  [id number]   cc:   Kirk Ruths, M.D.  Gerrit Friends. Dietrich Pates, MD, [facility]  Patrica Duel, M.D.

## 2011-01-18 NOTE — H&P (Signed)
[name], [name] NO.:  [id number]   MEDICAL RECORD NO.:  [id number]          PATIENT TYPE:  INP   LOCATION:  3712                         FACILITY:  [facility]   PHYSICIAN:  Altha Harm, MDDATE OF BIRTH:  03/05/1941   DATE OF ADMISSION:  07/09/2007  DATE OF DISCHARGE:                              HISTORY & PHYSICAL   CHIEF COMPLAINT:  Back pain.   HISTORY OF PRESENT ILLNESS:  This is a 70 year old gentleman who was  seen today by Dr. Corliss Skains in his office for a consultation secondary  to back pain, and upon evaluation he was found to have an acute  compression fracture requiring kyphoplasty.  Dr. Corliss Skains does not  admit to the hospital, and thus has called and asked [name] to admit the  patient to the hospital to have a kyphoplasty performed.  The patient  appears to be excruciating amounts of pain upon arrival to the emergency  room.  He states that he has been having multiple falls in the last few  months, and has had a 30-pound weight loss over the last 10 weeks.  The  patient describes pain in his back throughout his legs and his upper  extremities, and he is really unable to elaborate on the pain except  that he says that the pain is a 10/10 and does not seem to be controlled  with any of the pain medications that he is taking.  He does identify  associated weakness in his upper extremities and lower extremities, and  actually the patient does appear to have muscle waisting present.  He  states that he has ongoing nausea and dry heaves, but no real vomiting.  He states that his oral intake has been markedly decreased over the past  few months.  The patient also has a history of diabetes, and states that  he has neuropathy; however, he states his blood sugars are usually well  controlled, below 129.  The finding of a well-controlled blood sugar is  certainly inconsistent with progression of secondary complications,  including neuropathy and diabetic  gastroparesis.  It is concerning that  the patient has had such a significant weight loss and that he has such  progressive weakness in the upper and lower extremities.  The patient  has not been evaluated for the progressive weakness, and has only seen  Dr. Corliss Skains for the evaluation of the fractures of the lower  extremities.  Thus, we were asked to admit the patient for the procedure  tomorrow.   PAST MEDICAL HISTORY:  Significant for:  1. A history of kidney cancer, status post nephrectomy on the left.  2. CABG x5.  3. Myocardial infarction x2.  4. Peripheral vascular disease.  5. Left femoral popliteal bypass.  6. Cholecystectomy.  7. Diabetes type 2.  8. Neuropathy.  9. Hypertension.  10.Coronary artery disease.  11.Hyperlipidemia.   FAMILY HISTORY:  Significant for diabetes type 2.   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  He smokes one pack per day for 47 years.  He drinks  alcohol  socially.  Denies any drug use.  He resides with his wife, who appears  to be very supportive.   ALLERGIES:  PENICILLIN and to ERYTHROMYCIN.   CURRENT MEDICATIONS:  Include the following:  1. Actos 50 mg p.o. daily.  2. Imdur 30 mg p.o. daily.  3. Cymbalta 20 mg p.o. b.i.d.  4. Aspirin 81 mg p.o. daily.  5. Stool softener 100 mg p.o. t.i.d.  6. Fentanyl 100 mcg patch transdermally q.72 hours.  7. Seroquel 200 mg p.o. q.h.s.  8. Ambien 20 mg p.o. q.h.s.  9. Triazolam 0.25 mg p.o. q.h.s.  10.Hydromorphone 60 mg p.o. q.4 hours p.r.n.  11.Allopurinol 300 mg p.o. daily.  12.Amaryl 2 mg p.o. daily.  13.Lipitor 40 mg p.o. daily.  14.Flomax 0.4 mg p.o. daily.  15.Lisinopril 10 mg p.o. daily.   PHYSICAL EXAMINATION:  GENERAL:  The patient has no laboratory studies  available at this time.  He is sitting up in a chair in moderate  distress secondary to pain.  VITAL SIGNS:  Blood pressure is 171/100, heart rate 94, temperature 97,  O2 sats 98%  on room air, respiratory rate 16.  CBG 119.  HEENT:  He is normocephalic, atraumatic.  Pupils equally round and  reactive to light and accommodation.  Extraocular movements are intact.  Tympanic membranes are translucent bilaterally with good landmarks.  Oropharynx is moist.  No exudate, erythema or lesions are noted.  Trachea is midline.  No masses, no thyromegaly, no JVD, no carotid  bruit.  RESPIRATORY:  Lungs are clear to auscultation.  No wheezing, rhonchi or  rales noted.  CARDIOVASCULAR:  He has a healed midline scar.  Normal S1 and S2.  No  murmurs, rubs or gallops are noted.  PMI is nondisplaced.  No heaves or  thrills on palpation.  ABDOMINAL:  The patient has an abdominal mass in the left lateral upper  quadrant.  The abdomen is obese, soft.  There is some tenderness in the  left upper quadrant and left epigastric area.  There is no  hepatosplenomegaly noted.  NEUROLOGICAL:  The patient has only 3-minus/5 strength in the bilateral  upper extremities and bilateral lower extremities.  DTRs are hyperactive  in the bilateral upper and lower extremities.  Sensation is intact to  light touch.  Gait examination is deferred at this time.  The patient  has no focal neurological weakness, but certainly has generalized  weakness and muscle waisting in the bilateral upper and lower  extremities.  MUSCULOSKELETAL:  He has no warmth, swelling or erythema around the  joints, but he does have spinal tenderness noted.  PSYCHIATRIC:  The patient is somewhat histrionic and complaining of  pain.  He is alert and oriented x3.  He has good insight and cognition,  and good recent and remote recall.   ASSESSMENT AND PLAN:  This is a patient who presents with:  1. Compression spinal fracture amenable to kyphoplasty.  The plan is      for the patient to have kyphoplasty performed by Dr. Corliss Skains      tomorrow or the next day.  2. Chronic pain, uncontrolled.  The patient will be placed on his       usual medications, and then supplemental intravenous Dilaudid for      better pain control.  3. Progressive weakness.  It is unclear as to when exactly the      weakness started.  However, I will check the patient's LFTs to  insure that he may not be having some degree of rhabdomyolysis      secondary to his Lipitor.  If this is ruled out, then I will get      neurology to see him, as they may need to conduct nerve conduction      studies and nerve conduction velocities and EMGs on this patient to      further determine if he actually has a progressive neuromuscular      disorder occurring.  Physical therapy and occupational therapy will      be consulted after the      patient has completed his kyphoplasty.  In terms of his chronic      issues, the patient will be resumed on his usual medications, blood      sugars monitored, and supplemented insulin as needed.  The aspirin      will be held until after the patient's kyphoplasty, and that will      be resumed.      Altha Harm, MD  Electronically Signed     MAM/MEDQ  D:  07/09/2007  T:  07/09/2007  Job:  [id number]

## 2011-01-18 NOTE — H&P (Signed)
[name], [name]               ACCOUNT NO.:  [id number]   MEDICAL RECORD NO.:  [id number]          PATIENT TYPE:  INP   LOCATION:  2027                         FACILITY:  [facility]   PHYSICIAN:  Juleen China IV, MDDATE OF BIRTH:  October 21, 1940   DATE OF ADMISSION:  08/11/2008  DATE OF DISCHARGE:                              HISTORY & PHYSICAL   REASON FOR ADMISSION:  Ischemic left leg.   HISTORY:  This is a 70 year old gentleman that I saw on July 21, 2008, for severe ulceration of his left leg.  The patient states for the  past 6 months he has been having leg pain and had an open wound  approximately for 1 month.  He had been doing routine dressing changes.  He was having a significant amount of pain.  He had a femoropopliteal  bypass graft performed in [place] approximately 8 years ago.  The  patient does have a duplex ultrasound on the computer which confirms  bypass graft occlusion.  This was back in December.  Ankle brachial  index was 0.3.  I took him 2 days later for arteriogram.  He indeed has  an occluded bypass graft with two-vessel runoff via a diseased anterior  tibial and peroneal artery.  The patient comes back today for  discussions on management.  He states that his pain is 10/10.   REVIEW OF SYSTEMS:  Negative except for the HPI.   PAST MEDICAL HISTORY:  1. Hypertension.  2. History of MI.  3. Coronary artery disease.  4. Ischemic cardiomyopathy.  5. Congestive heart failure.  6. Diabetes.  7. Diabetic neuropathy.  8. 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  9. History of CABG.  10.History of fem-popliteal bypass graft.   FAMILY HISTORY:  Positive for cardiovascular disease.   SOCIAL HISTORY:  He is married with 2 children.  He is retired.  He  worked as a Emergency planning/management officer.  Currently smokes about 1/2 pack a day.  He  does not drink.   MEDICATIONS:  Gabapentin, Seroquel, simvastatin, metoclopramide, Coreg,  diazepam, potassium,  OxyContin, Lasix, Halcion, Flexeril, Lunesta,  Flomax, Protonix, Amaryl, Actos, allopurinol, Toprol, ramipril, aspirin,  and fentanyl patch.   ALLERGIES:  IV DYE, PENICILLIN, and ERYTHROMYCIN.   PHYSICAL EXAMINATION:  VITAL SIGNS:  Not recorded today.  He is  afebrile.  GENERAL:  He is in significant pain, crying.  CARDIOVASCULAR:  He is tachycardic.  PULMONARY:  His lungs are clear.  ABDOMEN:  Soft.  EXTREMITIES:  Left leg shows severe ulceration down to tendon covering  the entire dorsum of the foot.  There is erythema associated with it.  There is edema at the mid leg.   ASSESSMENT:  Ischemic left leg.   PLAN:  Given the patient's multiple medical problems and the extent of  the ulceration in his left foot, I do not think this foot is  salvageable.  I had a long conversation with the patient, his wife, and  his son via the telephone reiterating this.  He does have a patent  runoff vessel, however, he does not  have lower extremity vein and would  require arm vein harvest with his cardiac history.  I think he would be  high risk for cardiac complications during the surgery.  In addition, I  do not think his foot is salvageable, so I would not recommend  revascularization, because I do not think it would change the ultimate  outcome.  I am recommending below-knee, possible above-knee amputation.  The patient can be admitted to the hospital for pain control.  His  surgery will be scheduled for tomorrow.           ______________________________  V. Charlena Cross, MD  Electronically Signed     VWB/MEDQ  D:  08/11/2008  T:  08/12/2008  Job:  [id number]   cc:   Gerrit Friends. Dietrich Pates, MD, [facility]  Patrica Duel, M.D.

## 2011-01-18 NOTE — Consult Note (Signed)
NAMESAVVA, BEAMER               ACCOUNT NO.:  [id number]   MEDICAL RECORD NO.:  [id number]          PATIENT TYPE:  OUT   LOCATION:  XRAY                         FACILITY:  [facility]   PHYSICIAN:  Sanjeev K. Deveshwar, M.D.DATE OF BIRTH:  [birth year]   DATE OF CONSULTATION:  07/24/2007  DATE OF DISCHARGE:                                 CONSULTATION   DATE OF CONSULT:  July 24, 2007.   CHIEF COMPLAINT:  Status post L1 kyphoplasty performed July 10, 2007.   HISTORY OF PRESENT ILLNESS:  This is a 70 year old male referred to Dr.  Corliss Skains for a painful compression fracture.  The patient was unable to  tolerate an MRI.  He did have a CT scan as well as a bone scan. He was  found to have old fractures at multiple levels with an acute fracture at  L1. The patient was in severe pain and required admission to the  hospital for management of his pain on July 10, 2007. He underwent a  kyphoplasty performed by Dr. Corliss Skains with excellent relief of his  pain.  A pathology report showed no evidence for malignancy.   Unfortunately, the patient was readmitted to [facility] on  July 14, 2007 to July 18, 2007 with respiratory failure felt to  be multifactorial with acute pulmonary edema, COPD and probable  pneumonia. During that stay he was found to have elevated troponin  enzymes.  Arrangements were made to have the patient follow-up with Dr.  Lynnea Ferrier at [facility] for an outpatient Myoview.  Apparently the patient had a 2-D echo that showed an ejection fraction  of only 25%.   The patient returns today to be seen in follow-up by Dr. Corliss Skains.   PAST MEDICAL HISTORY:  Please see the above-noted history. The patient  also has a history of an anxiety disorder as well as a mood disorder.  He has history of tobacco use, hyperlipidemia, diabetes mellitus,  obesity, hypertension.  He has a history of kidney cancer, peripheral  vascular disease.  He had  coronary artery bypass graft surgery in 2002  as well as in 2004.  He has a history of psychiatric disorders. He is  status post nephrectomy in 2001. He is status post cholecystectomy.   ALLERGIES:  The the patient is allergic to CONTRAST DYE, PENICILLIN and  ERYTHROMYCIN.   MEDICATIONS:  The patient states he is currently taking Morphine for  pain. His discharge medications from July 18, 2007 included:  1. Allopurinol 300 mg daily.  2. Aspirin 81 mg daily.  3. Lipitor 40 mg daily.  4. Colace 100 mg 3 times daily.  5. Cymbalta 20 mg twice daily.  6. Duragesic patches 72 mcg every 72 hours.  7. Amaryl 4 mg daily.  8. Imdur 30 mg daily.  9. Lisinopril 5 mg b.i.d.  10.MiraLax 17 grams p.r.n.  11.Seroquel 200 mg at bedtime.  12.Celexa 40 mg daily.  13.Lasix 20 mg daily.  14.Metoprolol 25 mg daily.  15.Nicotine patches daily.  16.Prednisone tapering dose.  17.Xanax b.i.d.  18,  Flomax 4.4 mg daily.  1.  Halcion 0.2 mg daily at bedtime.  2. Dilaudid p.r.n.  3. Ambien p.r.n.   SOCIAL HISTORY:  The patient is married.  He lives with his wife in  [place]. He has two sons and several grandchildren. He has smoked one  pack a day since the age of 31.  He reports occasional alcohol use.  He  is retired from the [employer].   FAMILY HISTORY:  His mother died from an MI.  She also had diabetes.  His father died from lymphoma.  He has a brother who died recently with  complications of diabetes.   IMPRESSION AND PLAN:  As noted, the patient returns today to be seen in  follow-up after his L1 kyphoplasty performed July 10, 2007.  The  patient initially had good relief of his pain however several days after  the procedure while getting out of bed, he heard a pop in his back and  developed again severe back pain.  He has been in pain every since  despite high doses of narcotic medications.  He is unable to ascertain  exactly where the pain is located in his  back, although he feels it is  the same vicinity as his other pain. He states that his pain had  previously been a 12 on 1-10 scale, it  is now approximately an 8 on 1-  10 scale despite morphine every 3-4 hours.   Dr. Corliss Skains reviewed the results of the previous studies.  He felt  that the patient probably has a new fracture.  An MRI was recommended  however, the patient apparently has severe difficulties with MRIs due to  anxiety. A bone scan has been scheduled for tomorrow to look for new  fractures. If there is no new fracture, Dr. Corliss Skains felt that the  patient might need additional cement added to the previous L1 fracture.  The patient denies any recent fever or chills.  He does state that he  has been constipated for approximately 2 weeks despite multiple  laxatives and stool softeners.  We gave him the name of Dr. Karilyn Cota in  [place], a gastroenterologist who might be able to help with his  constipation problem.   Greater than 15 minutes was spent on this consult.      Delton See, P.A.    ______________________________  Grandville Silos. Corliss Skains, M.D.    DR/MEDQ  D:  07/24/2007  T:  07/25/2007  Job:  [id number]   cc:   Kirk Ruths, M.D.  Ritta Slot, MD

## 2011-01-18 NOTE — Discharge Summary (Signed)
[name], [name]               ACCOUNT NO.:  [id number]   MEDICAL RECORD NO.:  [id number]          PATIENT TYPE:  INP   LOCATION:  2021                         FACILITY:  [facility]   PHYSICIAN:  Juleen China IV, MDDATE OF BIRTH:  1941/01/14   DATE OF ADMISSION:  09/26/2008  DATE OF DISCHARGE:  09/30/2008                               DISCHARGE SUMMARY   FINAL DIAGNOSES:  1. Ischemic necrosis of amputation stump.  2. Hypertension.  3. History of coronary artery disease.  4. Diabetes.  5. Diabetic neuropathy.  6. 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  7. History of femoropopliteal bypass grafting.   PROCEDURE PERFORMED:  Revision of left below-knee amputation to above-  knee amputation by Dr. Hart Rochester on September 26, 2008.   COMPLICATIONS:  None.   CONDITION ON DISCHARGE:  Stable, improving.   DISCHARGE MEDICATIONS:  He is instructed to resume all previous  medications consisting of:  1. Aspirin 81 mg p.o. daily.  2. Zolpidem 10 mg 2 nightly.  3. Glimepiride 1 mg p.o. daily.  4. Iron sulfate 2 tablets b.i.d.  5. Fentanyl patch 25 mg transdermally.  6. Diazepam 10 mg t.i.d. p.r.n.  7. Flomax 0.4 mg p.o. b.i.d.  8. Mirtazapine 15 mg p.o. nightly.  9. Sertraline 50 mg 1-1/2 tablets nightly.  10.Trazodone 50 mg p.o. nightly.  11.Coreg 12.5 mg p.o. b.i.d.  12.Allopurinol 200 mg p.o. daily.  13.Ramipril 2.5 mg p.o. b.i.d.  14.Potassium 10 mEq p.o. daily.  15.Lasix 40 mg p.o. daily.  16.Oxycodone 7.5/325 one p.o. q.4-6 h. p.r.n. pain, total #60 were      given.  17.Actos 10 mg p.o. daily, .  18.Nitroglycerin sublingual p.r.n. chest pain.   DISPOSITION:  He is being discharged home with home health.  He is to  see Dr. Myra Gianotti in 4 weeks for staple removal and continuing evaluation.   Brief identifying statement of complete details, please refer the typed  history and physical.  Briefly, this very pleasant gentleman underwent  BKA in the late December 2009.  He  presented to the office on September 23, 2008, with increasing chest pain and drainage from his stump.  Dr.  Hart Rochester evaluated his stump and found him to have extensive skin necrosis  and recommended conversion to a left AKA.  He was informed of the risks  and benefits of the procedure and after careful consideration elected to  proceed with surgery.   HOSPITAL COURSE:  Preoperative workup was completed as an outpatient.  He was brought in through same-day surgery and underwent the  aforementioned revision of his BKA stump.  The procedure was without  complications.  He was returned to the Postanesthesia Care Unit  extubated.  Following stabilization, he was transferred to a bed on a  surgical convalescent floor.  His postoperative course was benign.  His  diet and activities were advanced and he tolerated them.  He was felt  stable on September 30, 2008, and discharged home.      Wilmon Arms, PA      V. Charlena Cross, MD  Electronically Signed  KEL/MEDQ  D:  09/30/2008  T:  09/30/2008  Job:  [id number]

## 2011-01-18 NOTE — H&P (Signed)
NAMESHMUEL, GIRGIS NO.:  [id number]   MEDICAL RECORD NO.:  [id number]          PATIENT TYPE:  INP   LOCATION:  2021                         FACILITY:  [facility]   PHYSICIAN:  Quita Skye. Hart Rochester, M.D.  DATE OF BIRTH:  01/12/1941   DATE OF ADMISSION:  09/26/2008  DATE OF DISCHARGE:                              HISTORY & PHYSICAL   This is an interim admission note.   CHIEF COMPLAINT:  Increase in pain over the last several days with odor  and drainage from amputation of the stump.   HISTORY OF PRESENT ILLNESS:  This 70 year old retired male patient with  chronic pain syndrome, and a history of a femoral popliteal bypass graft  and diabetes underwent an left BKA on August 17, 2008, for a left-  sided ischemia by Dr. Sharlot Gowda.  He was transferred to the Rehabilitative  Medicine and discharged from there on August 27, 2008, after a dose of  Unasyn course.  He has been followed in the office.  On September 23, 2008, he presented to the office complaining of increasing pain and  drainage from the stump.  Dr. Tenny Craw has evaluated his stump and found him  to have extensive skin necrosis at the below-knee amputation site on the  left with fat necrosis around and mild erythema.  Dr. Hart Rochester felt him to  have ischemic necrosis and recommended conversion to a left above-knee  amputation.  He was informed that the risk and benefits of the procedure  and offered surgery on September 26, 2008.   PAST MEDICAL HISTORY:  1. Hypertension.  2. History of myocardial infarction.  3. Coronary artery disease.  4. Ischemic cardiomyopathy.  5. Congestive heart failure.  6. Diabetes.  7. Diabetic neuropathy.  8. 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  9. History of coronary artery bypass grafting.  10.History of femoral popliteal bypass grafting.   REVIEW OF SYSTEMS:  Entirely negative, except for those symptoms  aforementioned consisting of the drainage and pain in  the stump of his  BKA.   PHYSICAL FINDINGS:  GENERAL:  Revealed a well-nourished, well-developed  man.  He was afebrile.  He appeared stated age with a diagnosis and need  for amputation.  HEENT:  NT/AC.  Membranes are pink and moist.  NECK:  Revealed a full range of motion.  CARDIAC:  Revealed a regular rate and rhythm.  CHEST:  Auscultation of his lungs demonstrated clear breath sounds.  ABDOMEN:  The abdomen was soft, nontender, and nondistended.  EXTREMITIES:  He is unable to move his extremities.  He has left BKA  stump was aspirated.  NEUROLOGIC:  Nonfocal.   ASSESSMENT:  Mr. Dewan has an ischemic necrosis of his below-knee  amputation stump.  Due to the extreme necrosis and diabetes, he should  undergo an above-knee amputation.  The above-knee amputation was  advanced to him and he had accepted.  He was admitted on September 26, 2008, for conversion of a left BKA to left AKA.      Wilmon Arms, PA      Quita Skye  Hart Rochester, M.D.  Electronically Signed    KEL/MEDQ  D:  09/26/2008  T:  09/27/2008  Job:  [id number]

## 2011-01-18 NOTE — H&P (Signed)
[name], [name]               ACCOUNT NO.:  [id number]   MEDICAL RECORD NO.:  [id number]          PATIENT TYPE:  EMS   LOCATION:  MAJO                         FACILITY:  [facility]   PHYSICIAN:  Herbie Saxon, MDDATE OF BIRTH:  Mar 26, 1941   DATE OF ADMISSION:  07/14/2007  DATE OF DISCHARGE:                              HISTORY & PHYSICAL   PRIMARY CARE PHYSICIAN:  Dr. Karleen Hampshire.   PRESENTING COMPLAINT:  Severe back pain, shortness of breath 1-day  duration.   HISTORY OF PRESENT ILLNESS:  This is a 70 year old male who was  discharged from [facility] on July 11, 2007 after having  had a kyphoplasty for L1 compression fracture.  The patient was quite  well until last night when he suddenly moved, heard a popping sound in  his back, with a sudden onset of severe, excruciating back pain.  The  patient also noticed immediate onset of nausea, vomiting and he  __________ shortness of breath and diaphoresis and he had to be brought  to the emergency room this morning.  He also has palpitations.  He  denies any chest pain.  He denies any syncopal episode.  No hematemesis.  No melena.  No hemoptysis.  No cough.  The patient can hardly complete a  sentence.  He is wheezing.  No body swelling, except for his left lumbar  hernia, which is status post his left kidney nephrectomy.  He denies any  new skin rash or joint swelling.  The patient is having a lot of dry  heaves and the majority of the history was given by his wife, as he is  extremely breathless.   PAST MEDICAL HISTORY:  Includes:  1. Diabetes.  2. Hypertension.  3. Hyperlipidemia.  4. Coronary artery disease.  5. Kidney cancer.  6. Peripheral vascular disease.  7. Status post a coronary artery bypass graft in 2002 and 2004.  8. History of psychiatric disorder.  9. Osteoporosis.   PAST SURGICAL HISTORY:  1. Status post a nephrectomy in 2001.  2. Status post cholecystectomy.   SOCIAL HISTORY:  The  patient is a retired Midwife.  He has a significant history of smoking.  He denies any history of  alcohol or drug abuse.   FAMILY HISTORY:  Noncontributory.   MEDICATIONS:  Presently on:  1. Imdur 30 mg daily.  2. Lipitor 40 mg q.h.s.  3. Actos 15 mg daily.  4. Aspirin 81 mg daily.  5. Flomax 0.4 mg q.h.s.  6. Celexa 40 mg daily.  7. Nitroglycerin p.r.n.  8. Zegerid 40 mg daily.  9. Endocet 10/325 mg p.r.n.  10.Allopurinol 300 mg daily.  11.Cymbalta 20 mg daily.  12.Glimepiride 2 mg daily.   ALLERGIES:  HE IS ALLERGIC TO:  1. IV DYE.  2. IODINE.   REVIEW OF SYSTEMS:  Twelve systems are reviewed.  Pertinent positives as  in the history of presenting complaints.   PHYSICAL EXAMINATION:  GENERAL:  He is an obese, elderly man in acute  respiratory distress.  VITAL SIGNS:  Temperature is 97.2.  Pulse 122.  Respiratory rate is 22.  Blood pressure 170/104.  HEENT:  Pupils equal and reactive to light and accommodation.  He is not  jaundiced.  Not pale.  No cyanosis.  He tachypneic.  Head is atraumatic,  normocephalic.  Oropharynx and nasal pharynx are clear.  NECK:  Elevated JVD.  There is no thyromegaly.  No carotid bruit.  HEART:  Sounds 1, 2 and 3 tachycardic with a 2/6 systolic murmur on the  left sternal border.  No parasternal heave.  LUNGS:  Breath sounds are bibasilar with rhonchi.  ABDOMEN:  Obese.  He has a left lumbar hernia.  Soft and nontender.  No  organomegaly palpable.  Bowel sounds normoactive.  NEURO:  He is alert and oriented x3.  EXTREMITIES:  Peripheral pulses are present.  No pedal edema.  Power is  5 in all limbs.  Deep tendon reflexes 2+ globally.  Moderate muscle  waisting in the biceps, triceps and calf muscles.   LABORATORY DATA:  Show the sodium is 134, potassium 3.9, chloride 103,  bicarbonate is 23, BUN 17, creatinine is 0.9.  Urinalysis grossly  negative.  The ABG shows a pH of 7.29, PCO2 35, PO2 186, bicarbonate 17.  Oxygen  saturation 100 this is on O2.  Chest x-ray and CT are pending.   ASSESSMENT:  1. Acute respiratory distress, rule flush pulmonary edema.  Rule out      acute pulmonary embolism.  Rule out acute myocardial infarction.  2. Sinus tachycardia.  3. Severe hypertension.  4. Nausea and vomiting.  5. Rule out reactive to L1 compression fracture, acute respiratory      acidosis.  6. Diabetes is fairly stable.  7. Peripheral vascular disease.  8. Tobacco abuse.  9. Hyperlipidemia.  10.History of kidney cancer, status post left nephrectomy.   PLAN:  The patient is to be admitted to the intensive care unit.  We  will get the critical management to see the patient urgently.  We will  start him on BiPAP and oxygen, n.p.o. for now.  Ice chips p.r.n.  We  will get his coagulation parameter.  We will obtain a CT angiogram LV  scan to rule out pulmonary embolism.  Get a CT of the LS spine, CT  abdomen and pelvis and CT of chest to rule out aortic dissection.  We  will get his cycle enzymes and EKG q.8 hours x3.  We will obtain  BNP  and TSH and fasting lipids.  IV fluid D5 half normal saline at 30 mL an  hour for now.  Lopressor 2.5 mg IV q.6 hours p.r.n. if heart rate is  greater than 109 and blood pressure 159/109.  We will start him on  morphine 2 mg IV q.6 hours p.r.n. for severe pain.  Ativan 2 mg IV q.8  hours p.r.n. for anxiety or agitation.  Lovenox 1 mg subcu q.12 hours  until acute myocardial infarction is ruled out.  Phenergan 25 mg IV q.8  hours p.r.n. alternating with Reglan 10 mg IV q.6 hours p.r.n.,  albuterol and Atrovent 1 unit dose q.2 hours p.r.n. shortness of breath.  Nasonex 1 p.o. b.i.d.  He should be on a sliding scale insulin system  protocol.  We will hold off of his oral hyperglycemic medication for now  since n.p.o.  I will put him on a nitroglycerin p.r.n. q.6 hours.  I  will obtain a 2D echocardiogram.  Consider cardiology consult if enzymes  rule in.  Discussed with Dr.  Molli Knock,  critical care physician.  Resume  medications, including his psych medications, and prepare for possible  endotracheal intubation.  The patient's ANS treatment medical condition  discussed with his wife and himself.  They verbalized understanding.  They patient is also to be on Cardizem 120 mg p.o. b.i.d.   CONDITION:  Critical.   PROGNOSIS:  Guarded.   TIME OF ENCOUNTER:  50 minutes.      Herbie Saxon, MD  Electronically Signed     MIO/MEDQ  D:  07/14/2007  T:  07/14/2007  Job:  [id number]

## 2011-01-18 NOTE — Op Note (Signed)
NAMECHANSON, TEEMS NO.:  [id number]   MEDICAL RECORD NO.:  [id number]          PATIENT TYPE:  INP   LOCATION:  2021                         FACILITY:  [facility]   PHYSICIAN:  Quita Skye. Hart Rochester, M.D.  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   POSTOPERATIVE DIAGNOSIS:  Ischemic necrosis of left below-knee  amputation stump secondary to severe peripheral vascular disease.   OPERATIONS:  Left above-knee amputation.   SURGEON:  Quita Skye. Hart Rochester, MD   FIRST ASSISTANT:  Wilmon Arms, PA   ANESTHESIA:  General endotracheal.   PROCEDURE:  The patient was taken to the operating room, placed in the  supine position, at which time satisfactory general endotracheal  anesthesia was administered.  The left below-knee amputation stump had  been dressed with a sterile Kerlix, and after prepping and draping in a  routine sterile manner, the below-knee stump was isolated using an  impervious stockinette up to the knee joint.  The left above-knee  amputation skin flaps were marked with equal anterior and posterior  flaps basing the femur about 6 inches above the knee joint.  Incision  was carried down through skin, subcutaneous tissue, and fascia.  The  muscle was divided with the Bovie.  Superficial femoral artery nerve and  vein were individually ligated with 2-0 silk ties and ligatures.  Femur  was cleaned proximally.  The periosteal elevator divided with Stryker  saw, smoothed with a rasp.  Posterior muscle was divided with the Bovie,  specimen removed from the table.  Adequate hemostasis was achieved  following thorough irrigation, a Hemovac drain brought out medially and  laterally secured with silk sutures.  Wound closed in layers with 2-0  Vicryl for the fascia, 2-0  Vicryl for the subcuticular layer, and skin  clips.  Sterile dressings applied.  The patient taken to recovery room  in satisfactory condition.      Quita Skye Hart Rochester, M.D.  Electronically Signed     JDL/MEDQ  D:  09/26/2008  T:  09/26/2008  Job:  [id number]

## 2011-01-18 NOTE — Consult Note (Signed)
NAMEWYATT, THORSTENSON NO.:  [id number]   MEDICAL RECORD NO.:  [id number]          PATIENT TYPE:  IPS   LOCATION:  4037                         FACILITY:  [facility]   PHYSICIAN:  Antonietta Breach, M.D.  DATE OF BIRTH:  1941/02/26   DATE OF CONSULTATION:  08/26/2008  DATE OF DISCHARGE:                                 CONSULTATION   Mr. Hazzard is still having very frustrating insomnia.  His hope is  improved.  His mood is improved.   With the patient's permission, the undersigned discussed the case with  the wife.  She also reports improved mood and that his is hope is  improved.  He is looking forward to Christmas.   REVIEW OF SYSTEMS:  GASTROINTESTINAL:  No complaints of loose stools or  nausea associated with his Zoloft.   There are no hallucinations, delusions or destructive thoughts.   He is cooperative with care.  His memory and orientation function are  intact.   At night he is receiving his trazodone 50 mg q.h.s.  He is also  receiving Valium 5 mg and did require a dose of 5 mg this morning at  3:35 a.m.   LABORATORY DATA:  WBC 8.6, hemoglobin 9.9, platelet count 358, sodium  136.   EXAMINATION:  VITAL SIGNS:  Temperature 98.2, pulse 78, respiratory rate  18, blood pressure 126/71, O2 saturation on room air 95%.   MENTAL STATUS EXAM:  Mr. Blumenfeld has intact eye contact.  His affect is  mildly flat at baseline but with an appropriate smile.  Mood is normal.  He has solid hope.  He is oriented completely.  His memory is intact.  Speech involves normal rate and prosody.  Thought process is logical,  coherent, goal-directed.  No looseness of associations.  Thought  content:  No thoughts of harming himself or others.  No delusions.  No  hallucinations.  Insight intact.  Judgment intact.   ASSESSMENT:  293.83 mood disorder not otherwise specified (idiopathic  and general medical factors, depressed).  296.33 major depressive disorder, recurrent, improved.  293.83 anxiety disorder not otherwise specified, post-traumatic stress  disorder.   PRELIMINARY DISCHARGE PLANNING:  Would ask the social worker to help Mr.  Riner have an appointment arranged at one of the clinics attached to  [facility], [facility] or [facility].  Also available  are private psychiatrists.   Would have the patient seen during the first week of discharge,  particularly focusing on continued adjustment for normal sleep.   Would continue the titration of Zoloft by 25 mg every 2-3 days to the  target dose of 100 mg daily, observing for any nausea or loose stools  which can be side effects.   Would increase his trazodone 50 mg q.h.s. standing, with the same p.r.n.  available.   No change yet in his Remeron.   Would provide the Ambien 1 hour after the p.r.n. trazodone if the p.r.n.  trazodone is not effective.      Antonietta Breach, M.D.  Electronically Signed     JW/MEDQ  D:  08/26/2008  T:  08/26/2008  Job:  [id number]

## 2011-01-18 NOTE — Consult Note (Signed)
Chambers, [name]               ACCOUNT NO.:  [id number]   MEDICAL RECORD NO.:  [id number]          PATIENT TYPE:  INP   LOCATION:  3029                         FACILITY:  [facility]   PHYSICIAN:  Quita Skye. Hart Rochester, M.D.  DATE OF BIRTH:  Sep 30, 1940   DATE OF CONSULTATION:  08/01/2007  DATE OF DISCHARGE:                                 CONSULTATION   REFERRING PHYSICIAN:  Charlaine Dalton. Sherene Sires, MD, FCCP   CHIEF COMPLAINT:  Finding of an occluded left femoral-popliteal bypass  graft on duplex scan.   HISTORY OF PRESENT ILLNESS:  I was asked to see this patient in  consultation by Dr. Francella Solian regarding incidental finding on a venous  duplex exam of an occluded left femoral-popliteal bypass graft.  The  patient was apparently studied by the vascular lab to rule out deep  venous thrombosis.  He was admitted for altered mental status and  respiratory failure on November 24 with severe COPD and pulmonary edema  and also with a low ejection fraction of 25%.  He has a history of a  left femoral-popliteal bypass graft performed in [place] in 2000 and  states that he felt a pulse in the graft in the subcutaneous position up  until about a month ago, and since then, it has been pulseless.  He has  had no change in his symptoms, however, with chronic numbness in both  feet but no pain, infection, ulceration or gangrene.  He does not  ambulate much because of his medical conditions.   PAST MEDICAL HISTORY:  1. COPD.  2. Cardiomyopathy with EF of 25%.  3. Anxiety disorder.  4. Tobacco abuse.  5. Hyperlipidemia.  6. Type 2 diabetes mellitus.  7. Obesity.  8. Peripheral vascular disease.  9. History of nephrectomy.  10.History of deep venous thrombosis of the right leg.   ALLERGIES:  PENICILLIN, CONTRAST MEDIA AND ERYTHROMYCIN.   SOCIAL HISTORY:  He is married, lives with his wife in [place], has 2  sons and several grandchildren.  He smokes a pack a day since the age of  26.  Occasional  alcohol use.   PHYSICAL EXAM:  VITAL SIGNS:  Blood pressure is 130/70, heart rate is  100, respirations 14.  GENERAL:  He is a chronically ill-appearing,  obese male who is in no apparent distress.  He is alert and talkative.  His carotid pulses are 3+ with no audible bruits.  CHEST:  Reveals  bilateral rhonchi.  ABDOMEN:  Obese.  No palpable masses.  EXTREMITY EXAM:  Reveals 2+ femoral pulses with no popliteal or distal  pulses.  Left leg has a subcutaneous placed femoral popliteal graft  apparently (in situ).  This apparently goes to the below-knee popliteal  artery, and it is pulseless.  There is some mild erythema at the knee  level, probably where the bypass graft occluded about 3-4 weeks ago.  Both feet are well-perfused with adequate capillary refill and good  motion and sensation.   IMPRESSION:  Incidental finding of an occluded left femoral-popliteal  bypass graft.  No further evaluation or treatment necessary unless  the  patient develops limb-threatening ischemia (i.e., infection, gangrene,  ulceration, etc.).  He is not ambulatory to any extent and would not be  a candidate for any further bypass for claudication.  If symptoms  worsen, we will be happy to evaluate him further at that time.      Quita Skye Hart Rochester, M.D.  Electronically Signed     JDL/MEDQ  D:  08/01/2007  T:  08/02/2007  Job:  [id number]

## 2011-01-18 NOTE — H&P (Signed)
[name], [name]               ACCOUNT NO.:  [id number]   MEDICAL RECORD NO.:  [id number]          PATIENT TYPE:  INP   LOCATION:  1439                         FACILITY:  [facility]   PHYSICIAN:  Ladell Pier, M.D.   DATE OF BIRTH:  [birth year]   DATE OF ADMISSION:  08/15/2007  DATE OF DISCHARGE:                              HISTORY & PHYSICAL   CHIEF COMPLAINT:  Left lower extremity pain.   HISTORY OF PRESENT ILLNESS:  The patient is a 70 year old white male  that presented to the emergency room secondary to severe pain in the  left lower extremity.  He has chronic pain of the left lower extremity  secondary to severe peripheral vascular disease and diabetic neuropathy.  He has been evaluated in the past by [facility], Dr. Hart Rochester. He had a left fem-  pop bypass graft done. He was recently back in November re-evaluated by  [facility] and noted that the graft was occluded.  But based on the  recommendations from the discharge summary, the patient had some flow to  the area through the graft and the decision was made not to do  intervention unless he had ischemia of the foot.  The patient presents  today secondary to severe pain.  He said he has tried pretty much  everything. He has tried Dilaudid which does not work.  He has tried to  fentanyl patch. He has tried morphine, Lyrica, Neurontin, Ultram and he  states many different other pain medications and nothing seemed to work  for the pain.  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 He is  thinking thoughts.  He states that he cannot take it anymore.  He has no  other complaints.  He recently had a kyphoplasty to his back for  compression fracture. This patient is admitted for pain control.   PAST MEDICAL HISTORY:  1. Chronic lower extremity and back pain.  2. Peripheral vascular disease, ABI of the left is 0.30, status post      fem-pop with occluded left graft  evaluated by Dr. Hart Rochester during his      admission in November, just a few weeks ago and the impression is      that there is adequate perfusion to the lower extremity with      adequate capillary refill and adequate motion and sensation.  3. COPD.  4. History of respiratory failure status post intubation November      2008.  5. Cardiomyopathy with EF of 25%.  6. Mood disorder.  7. Tobacco abuse.  8. Insomnia.  9. Obesity.  10.Diabetes with peripheral neuropathy.  11.Coronary artery disease status post CABG in 2004.  12.History of DVT of the right lower extremity.  13.Status post nephrectomy of the left kidney secondary to renal cell      cancer.  14.Status post cholecystectomy.  15.History of L1 compression fracture status post kyphoplasty.  16.Depression.  17.History of glaucoma.   FAMILY HISTORY:  Noncontributory.   SOCIAL HISTORY:  The patient  is married.  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 He used to smoke one pack a day of cigarettes.  He  said he is now down to only smoking a few cigarettes per day. He lives  with his wife in [place]. He has two sons and several grandchildren.  He drinks alcohol occasionally.   MEDICATIONS:  He brought in a list:  1. Allopurinol 300 mg daily.  2. Aspirin 81 mg daily.  3. Flomax 0.4 mg daily.  4. Imdur 30 mg daily.  5. Lasix 20 mg daily.  6. Lipitor 40 mg q.h.s.  7. Colace 100 mg as needed.  8. Amaryl 4 mg daily.  9. Celexa 40 mg daily.  10.Toprol XL 25 mg daily.  11.Seroquel 100 mg daily.  12.Ambien 10 mg q.h.s. p.r.n.  13.Nicotine patch.  14.Ultram 50 mg 1-2 every 4 hours as needed.   ALLERGIES:  He has allergies to ERYTHROMYCIN, __________  IVP DYE,  IODINE and PENICILLIN.   REVIEW OF SYSTEMS:  As per stated in the HPI.   PHYSICAL EXAM:  Temperature 98.7, blood pressure 157/95, pulse of 91,  respirations 22, pulse ox 98% on room air.  HEENT:  Normocephalic, atraumatic.  Pupils reactive to light,  throat  without erythema.  CARDIOVASCULAR:  Regular rate and rhythm.  LUNGS:  Clear bilaterally.  ABDOMEN:  Positive bowel sounds.  EXTREMITIES:  Without edema.  He does have severe decrease in pulse with  peripheral vascular disease changes on the left lower extremity.  He has  had decreased sensation also on the left lower extremity.   LABORATORY DATA:  Sodium 140, potassium 4.3, chloride 100, CO2 28,  glucose 94, BUN 17, creatinine 1.43, calcium 10.1. WBC 8.3, hemoglobin  11.6, MCV 93.7, platelet 437.  PT 12.7, INR 0.90, PTT 35, myoglobin 206,  troponin of less than 0.5, CK-MB 4.0.   ASSESSMENT/PLAN:  1. Severe pain in the left lower extremity secondary to peripheral      vascular disease/diabetic neuropathy:  This has been an ongoing      problem.  He has been tried on many different medications it seems      like in the past and nothing seemed to help.  Will try him on high-      dose morphine PCA since the Dilaudid he has gotten 2 mg here in the      ER and that does not help.  Will also start him on Neurontin three      times a day.  Will get palliative care to help with pain      management.  We will probably consult Dr. Hart Rochester again to see if      there is anything else that can be done to help with his severe      peripheral vascular disease. Will also check B12 level, TSH and RBC      folate.  2. Diabetes:  Will do sliding scale insulin and continue his Actos.  3. Insomnia:  Will continue Ambien p.r.n.  4. Coronary artery disease - cardiomyopathy. The patient is      asymptomatic right now, no chest pain, no shortness      of breath.  5. Single kidney secondary to nephrectomy. Will monitor urine output      and will KVO IV fluids at 30 secondary to the patient's severe      congestive heart failure.      Ladell Pier, M.D.  Electronically Signed  NJ/MEDQ  D:  08/15/2007  T:  08/16/2007  Job:  [id number]   cc:   Dr. Gillermina Hu, [place]   Quita Skye. Hart Rochester,  M.D.  [address]  [place]  [place] [address]

## 2011-01-18 NOTE — Assessment & Plan Note (Signed)
OFFICE VISIT   [name], [name]  DOB:  August 25, 1941                                       10/13/2008  MVHQI#:[id number]   REASON FOR VISIT:  Follow up wound.   HISTORY:  This is a 70 year old gentleman who underwent left below the  knee amputation for nonhealing wound.  Unfortunately this did not heal  and he had to be taken back to the operating room on January 22 for  conversion to an above knee amputation.  He comes in today for followup.  He has been doing very well with physical therapy.  He is not requiring  any narcotics at this time for pain.  He complains of a little bit of  phantom pain.   On physical examination his wound is well-healed and staples remain in  place.   PLAN:  I am going to give the patient a prescription for Neurontin 300  mg to address his phantom pain.  I have also given him a prescription  for nicotine patch as he has continued to quit smoking.  He is going to  come back in in 2 weeks for staple removal.  This will be a nurse visit.  I will see him back in 6 weeks.  He already has an appointment with the  prosthesis clinic.   [name] Ny, MD  Electronically Signed   VWB/MEDQ  D:  10/13/2008  T:  10/15/2008  Job:  [id number]   cc:   Kirk Ruths, M.D.  Gerrit Friends. Dietrich Pates, MD, [facility]  Patrica Duel, M.D.

## 2011-01-18 NOTE — H&P (Signed)
NAME:  [name], [name] NO.:  [id number]   MEDICAL RECORD NO.:  [id number]          PATIENT TYPE:  INP   LOCATION:  A315                          FACILITY:  [facility]   PHYSICIAN:  Monte Fantasia, MD  DATE OF BIRTH:  [birth year]   DATE OF ADMISSION:  DATE OF DISCHARGE:  LH                              HISTORY & PHYSICAL   PRIMARY CARE PHYSICIAN:  Patrica Duel, MD   HISTORY OF PRESENT ILLNESS:  The patient is a 70 year old Caucasian.  The patient was brought in by the EMS as the wife found him on the floor  unresponsive today morning.  The patient otherwise states that his  sugars were very low.  Then, in the ED, as per the ED note, the blood  sugar was found to be 22, glucose was given and in spite of that there  was very minimal improvement.  Also, the patient received 4 mg of Narcan  with minimal improvement.  The patient at present is alert, awake, and  oriented x3, but still a little bit confused and drowsy.  States that he  was in pain in his legs and in the back yesterday night, and had taken 2  patches of fentanyl and pain medications for the pain.  He is not aware  of what happened today morning.  The patient denies of any chest pains,  shortness of breath, nausea, vomiting, or any diaphoresis.  No  complaints of any headaches.  No complaints of any focal weaknesses in  any of his arms or legs.  The patient denies any shortness of breath.   PAST MEDICAL HISTORY:  Significant for:  1. Coronary artery disease.  2. Ischemic cardiomyopathy.  3. Congestive heart failure.  4. Diabetes.  5. Diabetic neuropathy.  6. 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  7. History of coronary artery bypass.  8. Status post coronary artery bypass.  9. Status post femoropopliteal artery bypass.   ALLERGIES:  The patient is allergic to PENICILLIN, ERYTHROMYCIN, AND IV  DYES CONTAINING IODINE.   SOCIAL HISTORY:  The patient is a nondrinker.  Does smoke half a  pack a  day for many years.  No history of any IV drug use.   PAST SURGICAL HISTORY:  1. Status post coronary artery bypass graft.  2. Femoropopliteal artery bypass graft.  3. 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  4. Back surgeries.  5. Surgery for carpal tunnel syndrome.  6. Status post cholecystectomy.   FAMILY HISTORY:  Father died of neoplastic disease, mother due to  myocardial infarction, one of his brothers died with complications of  diabetes.   MEDICATIONS AT HOME:  1. Gabapentin 300 mg p.o. t.i.d.  2. Seroquel 300 mg p.o. nightly.  3. Simvastatin 40 mg p.o. nightly.  4. Metoclopramide 10 mg p.o. 4 times a day.  5. Coreg 6.25 mg p.o. b.i.d.  6. Diazepam 10 mg p.o. t.i.d.  7. Potassium chloride 10 mEq with Lasix once a day.  8. OxyContin oral 20 mg b.i.d.  9. Lasix (furosemide) 40 mg p.o. daily.  10.Halcion 0.25 mg p.o.  nightly.  11.OxyContin 20 mg p.o. q.6 h. as needed.  12.Flexeril 10 mg p.o. t.i.d.  13.Lunesta 3 mg p.o. nightly.  14.Flomax 0.4 mg twice daily.  15.Protonix 40 mg once daily.  16.Amaryl 4 mg once daily.  17.Actos 15 mg once daily.  18.Allopurinol 300 mg as needed.  19.Toprol-XL 25 mg once daily.  20.Ramipril 2.5 mg p.o. twice daily.  21.Aspirin 81 mg p.o. daily.  22.On fentanyl patch 100 mcg to the chest wall q.72 h.   REVIEW OF SYSTEMS:  Unable to provide as the patient is a little bit  drowsy.   PHYSICAL EXAMINATION:  GENERAL:  On examination, the patient is in no  acute distress.  Alert, awake, oriented x3, is comfortable lying in bed  and is drowsy.  VITAL SIGNS:  In the emergency room, blood pressure 157/87, pulse of 89,  respirations of 24, oxygen saturations of 97%, and temperature of 96.5.  NECK:  Supple.  HEENT:  Pupils constricted, equal, and reacting to light.  No pallor.  No icterus.  No lymphadenopathy.  RESPIRATORY:  Air entry is bilaterally equal.  Vesicular breath sounds.  No rales.  No rhonchi.   CARDIOVASCULAR:  S1 and S2 normal.  Regular rate and rhythm.  No  murmurs.  No gallops.  ABDOMEN:  Soft.  Nontender.  No distention.  No guarding.  No rigidity.  No masses felt.  EXTREMITIES:  Dressing on the left foot.  Pedal pulses felt.  NEUROLOGIC:  The patient is minimal drowsy, however, alert, awake, and  oriented x3.  Power 5/5 in all extremities.  Deep tendon reflexes is 2+.  Knee, biceps, triceps, and gag reflexes intact.  No neck stiffness.  No  neck rigidity.  No nystagmus.  Pupils equal, reacting to light.  Cranial  nerves II through XII intact.   ASSESSMENT AND PLAN:  1. Altered mental status.  2. Symptomatic hypoglycemia.  3. Possible opioid overdose.  4. Ischemic cardiomyopathy.  5. Coronary artery disease status post coronary artery bypass graft.  6. History of congestive heart failure compensated.  7. Diabetes.  8. Diabetic neuropathy.  9. Status post left nephrectomy secondary to cancer of the ureter.  10.Status post cholecystectomy.   Plan is to admit the patient in under incompass Service.  Keep the  patient n.p.o. except medications.  We will give D5 half normal saline  at 50 mL per hour for 12 hours, strict input and output charting, and  daily weights.  We will hold all opioid medications, fentanyl patch, and  oxycodone.  We will hold also all sedation medications on board.  We  will have a Neurology evaluation in a.m.  The patient has no focal  neurological deficits.  CT of the head shows no gross acute intracranial  abnormality and mild atrophy.  We will continue at present his  medications as per the order sheet.   Symptomatic hypoglycemia.  We will hold Actos and Amaryl for now.  We  will continue with the D5 half normal saline at 50 mL per hour.  We will  monitor Chem strips every 1 hour.  We will have telemetry monitoring,  and the altered mental status  could be possibly secondary to  hypoglycemia, stroke, or possible opioid overdose.  If the  patient's  mental status improves, the patient can be started on oral feed as  deemed necessary.   Also, we will continue medications for his congestive heart failure.  The patient to continue on statins, Coreg 6.25 mg p.o.  b.i.d.,  furosemide 40 mg p.o. b.i.d., Toprol-XL 25 mg p.o. daily, ramipril 2.5  mg p.o. b.i.d., aspirin 81 mg p.o. daily.   We will continue gabapentin for his neuropathy.   We will need Wound Care for his left leg ulcers.   Aspiration, fall, seizure precautions, DVT prophylaxis, and GI  prophylaxis has been given.  We will give Protonix 40 mg p.o. daily.      Monte Fantasia, MD  Electronically Signed     MP/MEDQ  D:  06/29/2008  T:  06/30/2008  Job:  [id number]   cc:   Patrica Duel, M.D.  Fax: [phone]

## 2011-01-18 NOTE — H&P (Signed)
[name], [name] NO.:  [id number]   MEDICAL RECORD NO.:  [id number]          PATIENT TYPE:  IPS   LOCATION:  4037                         FACILITY:  [facility]   PHYSICIAN:  Ellwood Dense, M.D.   DATE OF BIRTH:  Mar 12, 1941   DATE OF ADMISSION:  08/20/2008  DATE OF DISCHARGE:                              HISTORY & PHYSICAL   PRIMARY CARE PHYSICIAN:  Kirk Ruths, M.D.   NEUROLOGIST:  Deanna Artis. Sharene Skeans, M.D.   TRAUMA SURGEON:  1. Charlena Cross, MD.   CARDIOLOGIST:  Gerrit Friends. Rothbart, MD.   HISTORY OF PRESENT ILLNESS:  Mr. Mcelwain is a 70 year old Caucasian male  with history of peripheral vascular disease and type 2 diabetes mellitus  with prior left femoral-popliteal bypass grafting with severe ulceration  and tendon involvement.   The patient was admitted August 11, 2008 with severe pain secondary to  an occluded bypass graft.  There were attempts at limb salvage which  were unsuccessful.  The patient underwent a left below-knee amputation  August 17, 2008 by Dr. Myra Gianotti.  Postoperatively, he had severe  cognitive changes.  Blood cultures were done without evidence of sepsis.  It was felt that the patient had questionable delirium.  He was  initially lethargic and febrile August 15, 2008.  At that time, blood  cultures were drawn x2 and the patient was started on IV antibiotics for  early questionable sepsis.  His confusion has been clearing.  Surgeons  have recommended close monitoring of the stump as he still may need  revision of the proximal amputation.  Therapies have been ongoing.  The  patient showed improvement with mobility with poor safety awareness and  impulsivity.  ADLs showed poor exercise tolerance, but the patient was  consistently participating and showing improvement in mental status.  Chest x-ray August 12, 2008 showed no consolidation with minimal  atelectatic changes at the left base.   The patient was evaluated by  the rehabilitation physicians and felt to  be an appropriate candidate for inpatient rehabilitation.   REVIEW OF SYSTEMS:  Positive for complaints of tingling at the left  below-knee amputation site.   PAST MEDICAL HISTORY:  1. Hypertension.  2. Coronary artery disease with prior coronary artery bypass graft.  3. Ischemic cardiomyopathy.  4. Congestive heart failure.  5. Peripheral vascular disease.  6. Type 2 diabetes mellitus with diabetic neuropathy.  7. History of deep vein thrombosis right lower extremity.  8. Chronic pain left lower extremity.  9. 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  10.Dyslipidemia.  11.Depression/anxiety disorder.  12.Back surgeries.  13.Carpal tunnel release.  14.Insomnia.  15.L1 compression fracture with kyphoplasty done November 2008.  16.History of post-traumatic stress disorder with prior admission.   FAMILY HISTORY:  Positive for coronary artery disease and diabetes  mellitus.   SOCIAL HISTORY:  The patient is married and is a retired Emergency planning/management officer  previously working in [place].  He lives in a two-level home with 8  steps to enter a bedroom.  The patient smokes one-half pack of  cigarettes per day and denies alcohol  intake.   FUNCTIONAL HISTORY PRIOR TO ADMISSION:  The patient needed assistance  with ADLs and transfers prior to admission.  He did not ambulate for a  few months prior to admission and was mostly wheelchair bound.   FUNCTIONAL STATUS AT PRESENT:  The patient requires min assist grooming,  mod assist toileting, mod assist transfers with min assist ambulation 4  feet with a rolling walker.  He requires maximum cues for rolling walker  use and maximum encouragement secondary to high anxiety levels.   ALLERGIES:  1. IV DYE.  2. PENICILLIN.  3. ERYTHROMYCIN.   MEDICATIONS PRIOR TO ADMISSION:  1. Flomax 0.4 mg q.h.s.  2. Valium 10 mg t.i.d. p.r.n.  3. Neurontin 300 mg t.i.d.  4. Seroquel 100 mg 3 tablets p.o.  q.h.s.  5. Zocor 40 mg daily.  6. Ambien 10 mg 2 tablets q.h.s.  7. Coreg 12.5 mg b.i.d.  8. Allopurinol 300 mg daily.  9. Aspirin daily.  10.Altace 2.5 mg b.i.d.  11.Toprol 25 mg daily.  12.Fentanyl patch 200 mcg per hour, changed q.72 h.  13.OxyContin 120 mg q.h.s.  14.Lasix 40 mg b.i.d.  15.K-Dur 10 mEq b.i.d.  16.Halcion 0.25 mg q.h.s.  17.Endocet 10/325 q.6 h. p.r.n.  18.Amaryl 4 mg daily.  19.Actos 15 mg daily.   LABORATORY:  Most recent hemoglobin was 10.1 with hematocrit 31.0,  platelet count of 534,000, white count of 8.7.  Most recent sodium is  140, potassium 3.7, chloride 102, bicarbonate 25, BUN 7, creatinine  0.78, glucose of 108.  Blood culture x2 August 15, 2008 are pending.  Hemoglobin A1c was measured at 6.0.   PHYSICAL EXAMINATION:  GENERAL:  Reasonably well-appearing, middle-aged-  elderly adult male lying in bed in moderate acute discomfort.  VITAL SIGNS:  Blood pressure 116/76 with pulse of 88, respiratory rate  20, temperature 97.5 with CBGs of 210 and 90.  HEENT:  Normocephalic, nontraumatic.  CARDIOVASCULAR:  Regular rate and rhythm.  S1-S2 without murmurs.  ABDOMEN:  Soft, slightly obese, nontender with positive bowel sounds.  LUNGS:  Clear to auscultation bilaterally.  NEUROLOGIC:  Alert and oriented x3.  Cranial nerves II-XII are intact.  EXTREMITIES:  Bilateral upper extremity exam showed a strength of 4+/5  throughout.  Bulk and tone were normal.  Examination of his left lower  extremity showed ischemic changes along the suture line with staples  intact without significant drainage.  Hip flexion was 4-/5 on the left  and 4+/5 in the right.  Right lower extremity strength was otherwise  4+/5.   DIAGNOSIS:  Status post left below-knee amputation secondary to ischemic  peripheral vascular disease/diabetic neuropathy/diabetic peripheral  vascular disease.   Presently, the patient will be admitted to receive collaborative and  interdisciplinary  care between the physiatrist, rehab nursing staff and  therapy team at least 3 hours per day at least 5 days per week.  The  patient's level of medical complexity and substantial therapy needs in  context with that medical necessity cannot be provided at a lesser  intensity of care.  The patient has experienced substantial functional  loss from his baseline.  Hopefully with aggressive therapies on a daily  basis, he will be able to improve his transfer abilities and be able to  return home with his wife at standby assist to min assist level.  Physiatrist will provide 24 hour management of medical needs as well as  oversight of the therapy.   PLAN/TREATMENT:  1. Provide guidance as appropriate regarding  interaction of the two.      A 24-hour rehab nursing will assist in management of bowel and      bladder and help integrate therapy concepts, techniques and      education.  Physical therapy will assess and treat for range of      motion, strengthening, bed mobility, transfers, pre-gait training      and equipment evaluation with goals of standby assist to min      assist.  2. Occupational therapy will assess and treat for range of motion,      strengthening, ADLs, cognitive/perceptional training, splinting and      equipment evaluation with goals of standby assist to modified      independent  3. Case management and social work will assess and treat for      psychosocial issues and discharge planning as appropriate.  Team      conferences will be held weekly to establish goals, assess progress      and determine barriers to discharge.   ESTIMATED LENGTH OF STAY:  5-7 days.   PROGNOSIS:  Good.   MEDICAL PROBLEM LIST:  1. Delirium/confusion with questionable withdrawal/improvement with      resumption of p.r.n. Valium.  2. Depression/anxiety disorder with continuation of low-dose Seroquel.  3. Acute blood loss anemia with continuation of iron supplement and      recheck blood levels  in the morning.  4. Subcutaneous Lovenox for DVT prophylaxis.  5. Type 2 diabetes mellitus with CBGs a.c. and h.s. and continuation      of Amaryl at low dose.  6. Coronary artery disease/congestive heart failure/cardiomegaly with      continuation of  weekly weights and avoidance of fluid overload.           ______________________________  Ellwood Dense, M.D.     DC/MEDQ  D:  08/20/2008  T:  08/20/2008  Job:  [id number]

## 2011-01-18 NOTE — Op Note (Signed)
NAMEROGAN, WIGLEY               ACCOUNT NO.:  [id number]   MEDICAL RECORD NO.:  [id number]          PATIENT TYPE:  AMB   LOCATION:  SDS                          FACILITY:  [facility]   PHYSICIAN:  Juleen China IV, MDDATE OF BIRTH:  1941/07/15   DATE OF PROCEDURE:  07/24/2008  DATE OF DISCHARGE:  07/24/2008                               OPERATIVE REPORT   PREOPERATIVE DIAGNOSIS:  Left leg ulcer.   POSTOPERATIVE DIAGNOSIS:  Left leg ulcer.   PROCEDURE PERFORMED:  1. Ultrasound access, right femoral artery.  2. Abdominal aortogram.  3. Left leg runoff.  4. Second order catheterization.   INDICATIONS:  This is a 70 year old gentleman with longstanding left leg  ulcer and significant pain.  He comes in for arteriogram and possible  intervention.   PROCEDURE:  The patient was identified in the holding area and taken to  room #8.  He was placed supine on the table.  Bilateral groins were  prepped and draped in standard sterile fashion.  A time-out was called.  The right femoral artery was evaluated with ultrasound and found to be  widely patent.  A 1% lidocaine was used for local anesthesia.  The right  femoral artery was accessed under ultrasound guidance with an 18-gauge  needle.  An 0.035 Bentson wire was advanced in an retrograde fashion  into the abdominal aorta under fluoroscopic visualization.  A 5-French  sheath was placed.  Over the wire, a pigtail catheter was placed through  the level of L1 and an abdominal aortogram was obtained.  Next, using a  crossover catheter and a Bentson wire, the wire was placed into the left  external iliac artery.  A end-hole catheter was then advanced over the  wire and placed in the distal left external iliac artery.  Contrast  injections were then performed into the left leg.   FINDINGS:  Aortogram:  The visualized portions of suprarenal abdominal  aorta showed minimal disease.  The infrarenal abdominal aorta is heavily  calcified.  The  right renal artery is widely patent.  Left renal artery  is not well visualized.  Right common iliac artery is widely patent.  The right external iliac artery is widely patent, both heavily  calcified.  The right hypogastric artery is patent.  Left common iliac  artery is ectatic, but patent without significant disease, without  significant stenoses.  The left external iliac artery is patent.  The  left hypogastric artery is patent.   Left lower extremity:  The left common femoral artery is calcified, but  patent.  The left profunda femoral artery is widely patent.  The left  superficial femoral artery occludes at its origin.  There is  reconstitution of the anterior tibial artery at its origin.  There are  multiple areas of focal stenosis throughout the anterior tibial artery.  It is patent across the ankle and fills the plantar arch.   After the above images were obtained, decision was made to terminate the  procedure.  Catheters and wires were removed.  The patient would be  taken to the holding area for  sheath pull.   IMPRESSION:  Occluded left superficial femoral and popliteal artery with  reconstitution of the anterior tibial artery, which is diseased in its  upper half.  The patient will be a candidate for a femoral to distal and  distal anterior tibial artery bypass for limb salvage.           ______________________________  V. Charlena Cross, MD  Electronically Signed     VWB/MEDQ  D:  07/24/2008  T:  07/25/2008  Job:  [id number]

## 2011-01-18 NOTE — Op Note (Signed)
[name], [name]               ACCOUNT NO.:  [id number]   MEDICAL RECORD NO.:  [id number]          PATIENT TYPE:  INP   LOCATION:  2027                         FACILITY:  [facility]   PHYSICIAN:  Juleen China IV, MDDATE OF BIRTH:  27-Jul-1941   DATE OF PROCEDURE:  DATE OF DISCHARGE:                               OPERATIVE REPORT   PREOPERATIVE DIAGNOSIS:  Ischemic left leg.   POSTOPERATIVE DIAGNOSIS:  Ischemic left leg.   PROCEDURE PERFORMED:  Left below-knee amputation.   ANESTHESIA:  General.   BLOOD LOSS:  Minimal.   ASSISTANT:  Jerold Coombe, PA   SURGEON:  1. Durene Cal IV, MD   SPECIMENS:  Left leg.   INDICATIONS:  Mr. Lanum is a 70 year old gentleman who presented to me  with a several-month history of a left leg ulcer.  He did undergo  arteriogram on July 24, 2008 and was found to have diseased tibial  vessels.  He had a history of a left femoral below-knee popliteal bypass  which was occluded.  The patient has a significant cardiovascular  history and is high risk for operative bypass from a cardiac standpoint.  Also based on the severity of his ulcer with tendon involvement, I did  not feel that limb salvage was an option for him.  For that reason, I  have recommended amputation.  After a long conversation with the patient  and his family, they agreed this is the best option for him.  The risks  and benefits were discussed and informed consent was signed.   DESCRIPTION OF PROCEDURE:  The patient was identified in the holding  area and taken to room 8.  He was placed supine on the table.  General  endotracheal anesthesia was administered.  The left leg was prepped and  draped in a standard sterile fashion and a time-out was called.  Tourniquet was placed in the upper thigh and taken 250 mmHg.  The site  was selected 12 cm below the tibial tuberosity and circumferential  measurement of the calf was obtained.  A one-third flap was measured and  marked with an ink pen.  A 10 blade was used to make incision over the  previously marked measurements.  Cautery was used to divide the  subcutaneous tissue.  Muscle in the anterior and lateral compartments  were then divided and taken down to the tibia and fibula.  Anterior  tibial artery and its neurovascular complex was identified, mobilized  proximally and then ligated with a 0 silk tie.  Next, the tibia was  circumferentially dissected free.  Periosteal elevator was used to  elevate the periosteum circumferentially.  Similarly, the periosteum of  tibia was elevated with a periosteal elevator.  A Gigli saw was used to  transect the tibia and the fibula was transected with bone cutters.  The  neurovascular bundle in the posterior compartment was divided between 0  silk ties.  A fasciotomy knife was then used to remove the leg.  I  addressed only the gastrocnemius and soleus muscles.  Nearly, all were  placed back out as there was  not significant muscle in the posterior  compartment.  Neurovascular complex was then mobilized proximal to the  cut edge of the bone and then re-ligated with a 0 silk tie.  A rasp was  used to smooth the cut surface of the tibia.  Rongeurs were used to  debride back the fibula.  There were  no sharp edges.  Hemostasis was then achieved with a Bovie cautery.  The  fascia was reapproximated with interrupted 2-0 Vicryl and the skin was  closed with staples.  There were no complications.  Sterile dressings  were applied and the patient was extubated and taken to recovery room in  stable condition.           ______________________________  V. Charlena Cross, MD  Electronically Signed     VWB/MEDQ  D:  08/17/2008  T:  08/18/2008  Job:  [id number]

## 2011-01-21 NOTE — Discharge Summary (Signed)
[name], CRUCES               ACCOUNT NO.:  [id number]   MEDICAL RECORD NO.:  [id number]          PATIENT TYPE:  INP   LOCATION:  A214                          FACILITY:  [facility]   PHYSICIAN:  Kirk Ruths, M.D.DATE OF BIRTH:  1941-05-29   DATE OF ADMISSION:  08/13/2006  DATE OF DISCHARGE:  12/10/2007LH                               DISCHARGE SUMMARY   DISCHARGE DIAGNOSES:  1. Pneumonia.  2. History of gross hematuria.  3. Hypokalemia.  4. Type 2 diabetes.  5. Status post coronary artery bypass graft with coronary artery      disease.  6. 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  7. Status post femoral-popliteal bypass.   HOSPITAL COURSE:  This 70 year old gentleman was admitted with several  days of progressive cough, congestion, and left-sided chest pain.  He  was felt to have pneumonia on additional chest film and was admitted for  IV antibiotics, rule out MI, due to his multiple medical problems.  The  patient's initial blood gases were normal.  CBC, D-dimer, BMP, and blood  sugars were all normal.  Serum potassium was slightly low at 3.3, but  this was corrected back to normal before discharge.  The patient  received prednisone as a prep for CT scans due to allergy to dye.  CTs  of the chest and the abdomen and pelvis showed no evidence of recurrent  cancer, although there was a small right lower lobe infiltrate and some  diverticulosis.  The patient was breathing better, feeling better about  the second hospital day.  Urine culture came back negative.  Admission  urinalysis showed 36 white cells, no red cells.  The patient was  admitted over the weekend and w2as anxious for discharge, although he  just moved into the area and needs consult with Cardiology and I feel he  needs a urology consult and possible cystoscopy with a history of gross  hematuria and a history of cancer.  He was anxious for discharge.  He  was stable.  He was discharged home  on his previous medications which  are available in the H&P.  As well, he was placed on Halcion and Cipro.  To be followed up by myself or Dr. Phillips Odor in one to two  weeks, at  which time we will make arrangements for outpatient workup for heart and  hematuria.      Kirk Ruths, M.D.  Electronically Signed     WMM/MEDQ  D:  08/30/2006  T:  08/30/2006  Job:  [id number]

## 2011-01-21 NOTE — H&P (Signed)
[name], HARING               ACCOUNT NO.:  [id number]   MEDICAL RECORD NO.:  [id number]          PATIENT TYPE:  INP   LOCATION:  A214                          FACILITY:  [facility]   PHYSICIAN:  Kirk Ruths, M.D.DATE OF BIRTH:  08/22/1941   DATE OF ADMISSION:  08/13/2006  DATE OF DISCHARGE:  LH                              HISTORY & PHYSICAL   CHIEF COMPLAINT:  1. Shortness of breath.  2. Chest pains.   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  The patient has had multiple medical problems in  the past including ureteral cancer, coronary artery disease with a  failed CABG, type 2 diabetes, hypertension.  The patient in the last  several days has had flu-like symptoms, muscle pain on the left side of  the chest, shortness of breath. insomnia and had fairly normal blood  gases in the ER with a normal white count and chest x-ray reportedly  showed pneumonia.  Patient also had a normal BNP as well as  electrolytes.   PAST MEDICAL HISTORY:  PATIENT IS ALLERGIC TO:  1. IVP DYE.  2. IODINE CONTRAST.  3. PENICILLIN.  4. ERYTHROMYCIN.   CURRENT MEDICATIONS:  1. Isosorbide 30 mg daily.  2. Allopurinol 300 daily.  3. Lipitor 40 daily.  4. Amiodarone 200 twice a day.  5. Lisinopril 10 mg twice a day.  6. Coreg 6.25 twice a day.  7. Actos 15 mg daily.  8. Fluoxetine 20 mg daily.  9. Aspirin 325 daily.  10.Flomax 0.4 daily.   He has in the past been on NovoLog but stopped approximately a year ago  and states his blood sugars have been well controlled on Actos and diet  alone.   REVIEW OF SYSTEMS:  The patient does admit to having some chest pains  and shortness of breath and diaphoresis.  He does have symptoms of  claudication.  Incidentally, he is status post fem-pop on the left.  The  patient also notes he has had some intermittent hematuria of unknown  etiology in the past.   SOCIAL HISTORY:  He is a nonsmoker,  nondrinker; again, a retired Physicist, medical.   PHYSICAL EXAM:  A nice middle-aged male who appears older than his age.  VITALS:  His temperature is 100.3, blood pressure is 140/80, pulse is 90  and regular, respirations 20 and somewhat labored.  The O2 sat is 100%  on room air.  HEENT:  The TMs are normal.  Pupils equal and react to light and  accommodation, oropharynx benign.  NECK:  Supple without JVD, bruit or thyromegaly.  LUNGS:  Clear in all areas.  HEART:  Regular sinus rhythm __________.  ABDOMEN:  Soft and nontender.  EXTREMITIES:  Without clubbing, cyanosis and edema.  There are  diminished pulses in his lower extremities.   ASSESSMENT:  1. Pneumonia by chest x-ray.  2. Coronary artery disease status post failed coronary artery bypass      graft.  3. History of kidney cancer.  4. History of hematuria.  5. History of claudication.  6. Status post left fem-pop bypass.  7. Type 2 diabetes.      Kirk Ruths, M.D.  Electronically Signed     WMM/MEDQ  D:  08/13/2006  T:  08/13/2006  Job:  [id number]

## 2011-01-21 NOTE — Discharge Summary (Signed)
NAMEROMULUS, HANRAHAN NO.:  [id number]   MEDICAL RECORD NO.:  [id number]          PATIENT TYPE:  INP   LOCATION:  A304                          FACILITY:  [facility]   PHYSICIAN:  Corrie Mckusick, M.D.  DATE OF BIRTH:  [birth year]   DATE OF ADMISSION:  01/25/2007  DATE OF DISCHARGE:  05/23/2008LH                               DISCHARGE SUMMARY   HISTORY OF PRESENT ILLNESS/PAST MEDICAL HISTORY:  Please see admission  H&P.   HOSPITAL COURSE:  A 70 year old gentleman with coronary artery disease,  renal carcinoma, status post nephrectomy congestive heart failure,  peripheral vascular disease, renal insufficiency, diabetes, and gout,  who presents with lower extremity edema.  We put him on 2A for close  monitoring.  Was started him on Lovenox subcutaneously q.12 h. and  elevated his legs.  Lower extremity venous Dopplers were done on the day  of admission which were negative.  The patient is followed closely by  Dr. Regino Schultze, and he was admitted for his opinion on the case.  The next  day, Dr. Tresa Moore Cardiology saw the patient and felt like this was  not active congestive heart failure.  Thought this was chronic swelling,  which is when I thought as well.  Echocardiogram will be obtained as an  outpatient.  The patient was set up for arterial Dopplers on the  following day which were negative, and the patient was discharged by Dr.  Regino Schultze, who is his primary physician, on Jan 26, 2007.  Again, the day  after admission, the patient was discharged, again on his home regimen  of medications, which you can see from the history and physical on the  day of admission, which was the day before.  He also was placed on a  prednisone taper in fact by  Dr. Regino Schultze, and he is going to follow up  with Dr. Regino Schultze in 1 week.  Please see Dr. Edison Simon note on Jan 26, 2007 for physical.   DISCHARGE CONDITION:  Improved and stable.      Corrie Mckusick, M.D.  Electronically  Signed     JCG/MEDQ  D:  03/06/2007  T:  03/07/2007  Job:  [id number]

## 2011-05-26 LAB — COMPREHENSIVE METABOLIC PANEL
ALT: 14
AST: 19
Alkaline Phosphatase: 53
CO2: 22
Chloride: 101
GFR calc Af Amer: 44 — ABNORMAL LOW
GFR calc non Af Amer: 37 — ABNORMAL LOW
Sodium: 135
Total Bilirubin: 0.5

## 2011-05-26 LAB — URINALYSIS, ROUTINE W REFLEX MICROSCOPIC
Ketones, ur: NEGATIVE
Nitrite: NEGATIVE
Specific Gravity, Urine: 1.014
Urobilinogen, UA: 0.2
pH: 5.5

## 2011-05-26 LAB — CARDIAC PANEL(CRET KIN+CKTOT+MB+TROPI)
CK, MB: 5.2 — ABNORMAL HIGH
Total CK: 109

## 2011-05-26 LAB — POCT I-STAT CREATININE
Creatinine, Ser: 2 — ABNORMAL HIGH
Operator id: 284251

## 2011-05-26 LAB — LIPID PANEL
Cholesterol: 148
HDL: 28 — ABNORMAL LOW
LDL Cholesterol: 73
Triglycerides: 174 — ABNORMAL HIGH

## 2011-05-26 LAB — CBC
HCT: 35.7 — ABNORMAL LOW
MCV: 94.7
Platelets: 315
RBC: 3.78 — ABNORMAL LOW
WBC: 8.8

## 2011-05-26 LAB — DIFFERENTIAL
Basophils Absolute: 0
Eosinophils Absolute: 0.1
Eosinophils Relative: 1

## 2011-05-26 LAB — I-STAT 8, (EC8 V) (CONVERTED LAB)
BUN: 38 — ABNORMAL HIGH
Bicarbonate: 22.2
Glucose, Bld: 58 — ABNORMAL LOW
pCO2, Ven: 45.9

## 2011-05-26 LAB — POCT CARDIAC MARKERS
CKMB, poc: 7.5
Myoglobin, poc: 226
Operator id: 284251
Troponin i, poc: <0.05

## 2011-05-26 LAB — HOMOCYSTEINE: Homocysteine: 25 — ABNORMAL HIGH

## 2011-05-26 LAB — TSH: TSH: 1.89

## 2011-05-30 LAB — CBC
HCT: 29.3 — ABNORMAL LOW
Hemoglobin: 11.9 — ABNORMAL LOW
Hemoglobin: 9.9 — ABNORMAL LOW
MCHC: 33.9
MCV: 92.4
RBC: 3.85 — ABNORMAL LOW
RDW: 18.3 — ABNORMAL HIGH
WBC: 10.1

## 2011-05-30 LAB — APTT: aPTT: 33

## 2011-05-30 LAB — POCT CARDIAC MARKERS
Myoglobin, poc: 317
Operator id: 4001

## 2011-05-30 LAB — COMPREHENSIVE METABOLIC PANEL
ALT: 21
AST: 18
Alkaline Phosphatase: 51
BUN: 56 — ABNORMAL HIGH
CO2: 20
Calcium: 9.2
Chloride: 102
GFR calc Af Amer: 15 — ABNORMAL LOW
GFR calc non Af Amer: 12 — ABNORMAL LOW
Glucose, Bld: 142 — ABNORMAL HIGH
Glucose, Bld: 196 — ABNORMAL HIGH
Potassium: 4
Sodium: 136
Total Protein: 6.6

## 2011-05-30 LAB — DIFFERENTIAL
Basophils Absolute: 0
Basophils Relative: 0
Lymphocytes Relative: 22
Lymphs Abs: 1.7
Monocytes Absolute: 0.5
Monocytes Relative: 5
Monocytes Relative: 6
Neutro Abs: 7.2
Neutro Abs: 5.9
Neutrophils Relative %: 72

## 2011-05-30 LAB — URINALYSIS, ROUTINE W REFLEX MICROSCOPIC
Glucose, UA: NEGATIVE
Glucose, UA: NEGATIVE
Protein, ur: 100 — AB
Protein, ur: 30 — AB

## 2011-05-30 LAB — RAPID URINE DRUG SCREEN, HOSP PERFORMED
Barbiturates: NOT DETECTED
Opiates: POSITIVE — AB

## 2011-05-30 LAB — URINE CULTURE

## 2011-05-30 LAB — URINE MICROSCOPIC-ADD ON

## 2011-06-06 LAB — CULTURE, BLOOD (ROUTINE X 2)
Culture: NO GROWTH
Culture: NO GROWTH

## 2011-06-06 LAB — CBC
HCT: 36.5 — ABNORMAL LOW
Hemoglobin: 12.3 — ABNORMAL LOW
MCV: 96.5
RDW: 15.5

## 2011-06-06 LAB — BLOOD GAS, ARTERIAL
FIO2: 1
O2 Saturation: 96.7
Patient temperature: 37

## 2011-06-06 LAB — RAPID URINE DRUG SCREEN, HOSP PERFORMED
Benzodiazepines: POSITIVE — AB
Cocaine: NOT DETECTED
Tetrahydrocannabinol: NOT DETECTED

## 2011-06-06 LAB — HEPATIC FUNCTION PANEL
ALT: 20
Alkaline Phosphatase: 57
Bilirubin, Direct: 0.1
Indirect Bilirubin: 0.3

## 2011-06-06 LAB — GLUCOSE, CAPILLARY
Glucose-Capillary: 116 — ABNORMAL HIGH
Glucose-Capillary: 82

## 2011-06-06 LAB — POCT CARDIAC MARKERS: Troponin i, poc: <0.05

## 2011-06-06 LAB — DIFFERENTIAL
Basophils Absolute: 0
Eosinophils Absolute: 0.1
Eosinophils Relative: 1
Lymphocytes Relative: 20
Monocytes Absolute: 0.8

## 2011-06-06 LAB — URINALYSIS, ROUTINE W REFLEX MICROSCOPIC
Bilirubin Urine: NEGATIVE
Glucose, UA: NEGATIVE
Ketones, ur: NEGATIVE
pH: 5

## 2011-06-06 LAB — BASIC METABOLIC PANEL
CO2: 25
Chloride: 101
GFR calc non Af Amer: 47 — ABNORMAL LOW
Glucose, Bld: 59 — ABNORMAL LOW
Potassium: 3.7
Sodium: 136

## 2011-06-06 LAB — CK TOTAL AND CKMB (NOT AT ARMC)
CK, MB: 7.2 — ABNORMAL HIGH
Relative Index: 2.2
Total CK: 329 — ABNORMAL HIGH

## 2011-06-07 LAB — POCT I-STAT, CHEM 8
Creatinine, Ser: 1.4
Hemoglobin: 11.9 — ABNORMAL LOW
Potassium: 4
Sodium: 138

## 2011-06-07 LAB — GLUCOSE, CAPILLARY
Glucose-Capillary: 107 — ABNORMAL HIGH
Glucose-Capillary: 65 — ABNORMAL LOW

## 2011-06-10 LAB — BLOOD GAS, ARTERIAL
Acid-Base Excess: 0.3 mmol/L (ref 0.0–2.0)
Drawn by: 24513
O2 Content: 3 L/min
pCO2 arterial: 30.5 mmHg — ABNORMAL LOW (ref 35.0–45.0)
pH, Arterial: 7.495 — ABNORMAL HIGH (ref 7.350–7.450)
pO2, Arterial: 80.1 mmHg (ref 80.0–100.0)

## 2011-06-10 LAB — CBC
HCT: 30 % — ABNORMAL LOW (ref 39.0–52.0)
HCT: 30.8 % — ABNORMAL LOW (ref 39.0–52.0)
HCT: 31 % — ABNORMAL LOW (ref 39.0–52.0)
HCT: 34.4 — ABNORMAL LOW
Hemoglobin: 10.1 g/dL — ABNORMAL LOW (ref 13.0–17.0)
Hemoglobin: 10.2 g/dL — ABNORMAL LOW (ref 13.0–17.0)
Hemoglobin: 9.9 g/dL — ABNORMAL LOW (ref 13.0–17.0)
MCHC: 32.2 g/dL (ref 30.0–36.0)
MCHC: 33.1 g/dL (ref 30.0–36.0)
MCHC: 33.2 g/dL (ref 30.0–36.0)
MCHC: 34.1 g/dL (ref 30.0–36.0)
MCV: 90.9 fL (ref 78.0–100.0)
MCV: 92.1 fL (ref 78.0–100.0)
MCV: 92.5 fL (ref 78.0–100.0)
MCV: 93.4 fL (ref 78.0–100.0)
MCV: 94.7 fL (ref 78.0–100.0)
MCV: 92.7
MCV: 95.9
Platelets: 358 10*3/uL (ref 150–400)
Platelets: 434 10*3/uL — ABNORMAL HIGH (ref 150–400)
Platelets: 534 10*3/uL — ABNORMAL HIGH (ref 150–400)
Platelets: 554 10*3/uL — ABNORMAL HIGH (ref 150–400)
RBC: 2.67 MIL/uL — ABNORMAL LOW (ref 4.22–5.81)
RBC: 3.27 MIL/uL — ABNORMAL LOW (ref 4.22–5.81)
RBC: 3.35 MIL/uL — ABNORMAL LOW (ref 4.22–5.81)
RBC: 3.37 MIL/uL — ABNORMAL LOW (ref 4.22–5.81)
RBC: 3.78 — ABNORMAL LOW
RBC: 3.59 — ABNORMAL LOW
RDW: 15.9 % — ABNORMAL HIGH (ref 11.5–15.5)
WBC: 11.2 10*3/uL — ABNORMAL HIGH (ref 4.0–10.5)
WBC: 12.5 10*3/uL — ABNORMAL HIGH (ref 4.0–10.5)
WBC: 8.7 10*3/uL (ref 4.0–10.5)
WBC: 9.3 10*3/uL (ref 4.0–10.5)
WBC: 9.5
WBC: 8.2

## 2011-06-10 LAB — GLUCOSE, CAPILLARY
Glucose-Capillary: 101 mg/dL — ABNORMAL HIGH (ref 70–99)
Glucose-Capillary: 102 mg/dL — ABNORMAL HIGH (ref 70–99)
Glucose-Capillary: 103 mg/dL — ABNORMAL HIGH (ref 70–99)
Glucose-Capillary: 104 mg/dL — ABNORMAL HIGH (ref 70–99)
Glucose-Capillary: 105 mg/dL — ABNORMAL HIGH (ref 70–99)
Glucose-Capillary: 106 mg/dL — ABNORMAL HIGH (ref 70–99)
Glucose-Capillary: 106 mg/dL — ABNORMAL HIGH (ref 70–99)
Glucose-Capillary: 108 mg/dL — ABNORMAL HIGH (ref 70–99)
Glucose-Capillary: 114 mg/dL — ABNORMAL HIGH (ref 70–99)
Glucose-Capillary: 117 mg/dL — ABNORMAL HIGH (ref 70–99)
Glucose-Capillary: 117 mg/dL — ABNORMAL HIGH (ref 70–99)
Glucose-Capillary: 117 mg/dL — ABNORMAL HIGH (ref 70–99)
Glucose-Capillary: 118 mg/dL — ABNORMAL HIGH (ref 70–99)
Glucose-Capillary: 123 mg/dL — ABNORMAL HIGH (ref 70–99)
Glucose-Capillary: 127 mg/dL — ABNORMAL HIGH (ref 70–99)
Glucose-Capillary: 134 mg/dL — ABNORMAL HIGH (ref 70–99)
Glucose-Capillary: 135 mg/dL — ABNORMAL HIGH (ref 70–99)
Glucose-Capillary: 135 mg/dL — ABNORMAL HIGH (ref 70–99)
Glucose-Capillary: 135 mg/dL — ABNORMAL HIGH (ref 70–99)
Glucose-Capillary: 137 mg/dL — ABNORMAL HIGH (ref 70–99)
Glucose-Capillary: 139 mg/dL — ABNORMAL HIGH (ref 70–99)
Glucose-Capillary: 144 mg/dL — ABNORMAL HIGH (ref 70–99)
Glucose-Capillary: 144 mg/dL — ABNORMAL HIGH (ref 70–99)
Glucose-Capillary: 150 mg/dL — ABNORMAL HIGH (ref 70–99)
Glucose-Capillary: 161 mg/dL — ABNORMAL HIGH (ref 70–99)
Glucose-Capillary: 191 mg/dL — ABNORMAL HIGH (ref 70–99)
Glucose-Capillary: 85 mg/dL (ref 70–99)
Glucose-Capillary: 86 mg/dL (ref 70–99)
Glucose-Capillary: 88 mg/dL (ref 70–99)
Glucose-Capillary: 92 mg/dL (ref 70–99)
Glucose-Capillary: 92 mg/dL (ref 70–99)
Glucose-Capillary: 96 mg/dL (ref 70–99)
Glucose-Capillary: 97 mg/dL (ref 70–99)
Glucose-Capillary: 98 mg/dL (ref 70–99)

## 2011-06-10 LAB — URINALYSIS, ROUTINE W REFLEX MICROSCOPIC
Bilirubin Urine: NEGATIVE
Ketones, ur: 15 mg/dL — AB
Nitrite: NEGATIVE
Specific Gravity, Urine: 1.008 (ref 1.005–1.030)
Urobilinogen, UA: 4 mg/dL — ABNORMAL HIGH (ref 0.0–1.0)
pH: 6 (ref 5.0–8.0)

## 2011-06-10 LAB — BASIC METABOLIC PANEL
BUN: 15 mg/dL (ref 6–23)
BUN: 24 mg/dL — ABNORMAL HIGH (ref 6–23)
BUN: 7 mg/dL (ref 6–23)
BUN: 8 mg/dL (ref 6–23)
CO2: 23 mEq/L (ref 19–32)
CO2: 23 mEq/L (ref 19–32)
CO2: 25 mEq/L (ref 19–32)
CO2: 30 mEq/L (ref 19–32)
Calcium: 8.2 mg/dL — ABNORMAL LOW (ref 8.4–10.5)
Calcium: 9.2 mg/dL (ref 8.4–10.5)
Chloride: 101 mEq/L (ref 96–112)
Chloride: 102 mEq/L (ref 96–112)
Chloride: 104 mEq/L (ref 96–112)
Chloride: 97 mEq/L (ref 96–112)
Chloride: 99 mEq/L (ref 96–112)
Chloride: 99
Creatinine, Ser: 0.8 mg/dL (ref 0.4–1.5)
Creatinine, Ser: 0.83 mg/dL (ref 0.4–1.5)
Creatinine, Ser: 0.98 mg/dL (ref 0.4–1.5)
Creatinine, Ser: 2 mg/dL — ABNORMAL HIGH (ref 0.4–1.5)
Creatinine, Ser: 1.19
GFR calc Af Amer: 41 mL/min — ABNORMAL LOW (ref >60)
GFR calc Af Amer: >60 mL/min (ref >60)
GFR calc Af Amer: >60 mL/min (ref >60)
GFR calc Af Amer: >60
GFR calc non Af Amer: 34 mL/min — ABNORMAL LOW (ref >60)
GFR calc non Af Amer: >60 mL/min (ref >60)
Glucose, Bld: 110 mg/dL — ABNORMAL HIGH (ref 70–99)
Potassium: 3.1 mEq/L — ABNORMAL LOW (ref 3.5–5.1)
Potassium: 3.7 mEq/L (ref 3.5–5.1)
Potassium: 3.9 mEq/L (ref 3.5–5.1)
Potassium: 4.5 mEq/L (ref 3.5–5.1)
Sodium: 136 mEq/L (ref 135–145)
Sodium: 138 mEq/L (ref 135–145)
Sodium: 140 mEq/L (ref 135–145)

## 2011-06-10 LAB — COMPREHENSIVE METABOLIC PANEL
ALT: 40 U/L (ref 0–53)
AST: 17
Alkaline Phosphatase: 133 U/L — ABNORMAL HIGH (ref 39–117)
Alkaline Phosphatase: 80
BUN: 28 — ABNORMAL HIGH
CO2: 27 mEq/L (ref 19–32)
CO2: 27
Chloride: 101 mEq/L (ref 96–112)
Chloride: 98
Creatinine, Ser: 1.45
GFR calc non Af Amer: >60 mL/min (ref >60)
GFR calc non Af Amer: 49 — ABNORMAL LOW
Glucose, Bld: 108 mg/dL — ABNORMAL HIGH (ref 70–99)
Potassium: 3.4 mEq/L — ABNORMAL LOW (ref 3.5–5.1)
Sodium: 136 mEq/L (ref 135–145)
Total Bilirubin: 0.6

## 2011-06-10 LAB — CULTURE, BLOOD (ROUTINE X 2)

## 2011-06-10 LAB — DIFFERENTIAL
Basophils Relative: 1 % (ref 0–1)
Basophils Relative: 1 % (ref 0–1)
Eosinophils Absolute: 0.1 10*3/uL (ref 0.0–0.7)
Lymphs Abs: 1.5
Monocytes Absolute: 0.7 10*3/uL (ref 0.1–1.0)
Monocytes Relative: 6
Neutro Abs: 7.3
Neutrophils Relative %: 57 % (ref 43–77)
Neutrophils Relative %: 77

## 2011-06-10 LAB — URINE MICROSCOPIC-ADD ON

## 2011-06-10 LAB — CARDIAC PANEL(CRET KIN+CKTOT+MB+TROPI)
CK, MB: 2.3 ng/mL (ref 0.3–4.0)
Relative Index: 1.5 (ref 0.0–2.5)
Total CK: 189 U/L (ref 7–232)

## 2011-06-10 LAB — HEPATIC FUNCTION PANEL
ALT: 20
AST: 20
Albumin: 3.3 — ABNORMAL LOW
Alkaline Phosphatase: 63
Total Bilirubin: 0.6

## 2011-06-10 LAB — PROTIME-INR
INR: 1.3 (ref 0.00–1.49)
INR: 1
Prothrombin Time: 17 seconds — ABNORMAL HIGH (ref 11.6–15.2)

## 2011-06-10 LAB — CROSSMATCH

## 2011-06-10 LAB — APTT: aPTT: 47 seconds — ABNORMAL HIGH (ref 24–37)

## 2011-06-13 LAB — URINALYSIS, ROUTINE W REFLEX MICROSCOPIC
Bilirubin Urine: NEGATIVE
Bilirubin Urine: NEGATIVE
Glucose, UA: NEGATIVE
Hgb urine dipstick: NEGATIVE
Ketones, ur: NEGATIVE
Ketones, ur: NEGATIVE
Nitrite: NEGATIVE
Protein, ur: NEGATIVE
pH: 7

## 2011-06-13 LAB — COMPREHENSIVE METABOLIC PANEL
ALT: 17
AST: 19
Alkaline Phosphatase: 65
CO2: 27
Calcium: 8.6
Chloride: 101
GFR calc Af Amer: >60
GFR calc non Af Amer: >60
Glucose, Bld: 54 — ABNORMAL LOW
Potassium: 3.9
Sodium: 137
Total Bilirubin: 0.5

## 2011-06-13 LAB — PROTIME-INR: Prothrombin Time: 12.7

## 2011-06-13 LAB — CK TOTAL AND CKMB (NOT AT ARMC)
CK, MB: 4.1 — ABNORMAL HIGH
Relative Index: INVALID
Total CK: 69

## 2011-06-13 LAB — CBC
HCT: 30.9 — ABNORMAL LOW
HCT: 34.5 — ABNORMAL LOW
Hemoglobin: 10.1 — ABNORMAL LOW
Hemoglobin: 10.5 — ABNORMAL LOW
MCHC: 33.8
MCV: 93.7
MCV: 93.7
Platelets: 329
RBC: 3.2 — ABNORMAL LOW
RBC: 3.68 — ABNORMAL LOW
RDW: 15.3
WBC: 6.2
WBC: 8.3

## 2011-06-13 LAB — BASIC METABOLIC PANEL
BUN: 16
CO2: 28
CO2: 28
Calcium: 10.1
Chloride: 100
Creatinine, Ser: 1.43
GFR calc Af Amer: >60
GFR calc non Af Amer: 50 — ABNORMAL LOW
Glucose, Bld: 85
Potassium: 3.7
Sodium: 140

## 2011-06-13 LAB — URINE MICROSCOPIC-ADD ON

## 2011-06-13 LAB — DIFFERENTIAL
Basophils Absolute: 0
Basophils Relative: 0
Eosinophils Absolute: 0 — ABNORMAL LOW
Eosinophils Absolute: 0.1 — ABNORMAL LOW
Eosinophils Relative: 1
Eosinophils Relative: 1
Monocytes Absolute: 0.5
Neutrophils Relative %: 63

## 2011-06-13 LAB — VITAMIN B12: Vitamin B-12: 428 (ref 211–911)

## 2011-06-13 LAB — TSH: TSH: 3.573

## 2011-06-13 LAB — POCT CARDIAC MARKERS
Operator id: 1415
Operator id: 4295
Troponin i, poc: <0.05
Troponin i, poc: <0.05

## 2011-06-13 LAB — URINE CULTURE: Culture: NO GROWTH

## 2011-06-13 LAB — HEMOGLOBIN A1C: Hgb A1c MFr Bld: 6.5 — ABNORMAL HIGH

## 2011-06-14 LAB — COMPREHENSIVE METABOLIC PANEL
AST: 54 — ABNORMAL HIGH
Alkaline Phosphatase: 52
BUN: 31 — ABNORMAL HIGH
BUN: 15
CO2: 23
Calcium: 9.3
Creatinine, Ser: 1.87 — ABNORMAL HIGH
GFR calc Af Amer: 44 — ABNORMAL LOW
GFR calc non Af Amer: 37 — ABNORMAL LOW
GFR calc non Af Amer: >60
Glucose, Bld: 220 — ABNORMAL HIGH
Potassium: 3.7
Total Bilirubin: 0.8
Total Bilirubin: 0.6
Total Protein: 7

## 2011-06-14 LAB — POCT I-STAT 3, ART BLOOD GAS (G3+)
Acid-base deficit: 1
Acid-base deficit: 1
Acid-base deficit: 2
Acid-base deficit: 3 — ABNORMAL HIGH
Acid-base deficit: 5 — ABNORMAL HIGH
Acid-base deficit: 9 — ABNORMAL HIGH
Bicarbonate: 23.4
Bicarbonate: 17.1 — ABNORMAL LOW
Bicarbonate: 19.5 — ABNORMAL LOW
Bicarbonate: 19.7 — ABNORMAL LOW
Bicarbonate: 20
Bicarbonate: 20.6
O2 Saturation: 100
O2 Saturation: 100
O2 Saturation: 100
O2 Saturation: 93
O2 Saturation: 93
O2 Saturation: 99
Operator id: 235881
Operator id: 248711
Operator id: 248711
Operator id: 280981
Operator id: 283391
Patient temperature: 97.7
Patient temperature: 97.7
Patient temperature: 98.6
TCO2: 18
TCO2: 20
TCO2: 20
TCO2: 21
TCO2: 21
pCO2 arterial: 25.2 — ABNORMAL LOW
pCO2 arterial: 26.7 — ABNORMAL LOW
pCO2 arterial: 27.9 — ABNORMAL LOW
pCO2 arterial: 34.8 — ABNORMAL LOW
pCO2 arterial: 35.5
pH, Arterial: 7.292 — ABNORMAL LOW
pH, Arterial: 7.367
pH, Arterial: 7.456 — ABNORMAL HIGH
pH, Arterial: 7.495 — ABNORMAL HIGH
pH, Arterial: 7.496 — ABNORMAL HIGH
pO2, Arterial: 225 — ABNORMAL HIGH
pO2, Arterial: 136 — ABNORMAL HIGH
pO2, Arterial: 186 — ABNORMAL HIGH
pO2, Arterial: 487 — ABNORMAL HIGH
pO2, Arterial: 56 — ABNORMAL LOW
pO2, Arterial: 61 — ABNORMAL LOW

## 2011-06-14 LAB — COMPREHENSIVE METABOLIC PANEL WITH GFR
ALT: 19
AST: 25
Albumin: 3.6
CO2: 20
Calcium: 9.1
Chloride: 101
Creatinine, Ser: 1
GFR calc Af Amer: >60
Sodium: 132 — ABNORMAL LOW

## 2011-06-14 LAB — CBC
HCT: 38 — ABNORMAL LOW
HCT: 37.5 — ABNORMAL LOW
HCT: 40.5
HCT: 42.2
Hemoglobin: 10.1 — ABNORMAL LOW
Hemoglobin: 12.4 — ABNORMAL LOW
Hemoglobin: 13.3
Hemoglobin: 13.3
Hemoglobin: 14
MCHC: 33.1
MCHC: 33.4
MCHC: 32.8
MCHC: 33
MCHC: 33.1
MCV: 95.3
MCV: 95.2
MCV: 95.4
MCV: 96.6
Platelets: 241
Platelets: 257
Platelets: 307
RBC: 3.15 — ABNORMAL LOW
RBC: 3.99 — ABNORMAL LOW
RBC: 3.87 — ABNORMAL LOW
RBC: 4.17 — ABNORMAL LOW
RBC: 4.19 — ABNORMAL LOW
RBC: 4.42
RDW: 15.6 — ABNORMAL HIGH
RDW: 15.6 — ABNORMAL HIGH
RDW: 15.7 — ABNORMAL HIGH
RDW: 15.9 — ABNORMAL HIGH
WBC: 10.8 — ABNORMAL HIGH
WBC: 13.6 — ABNORMAL HIGH
WBC: 7.2
WBC: 8.2

## 2011-06-14 LAB — POCT CARDIAC MARKERS
CKMB, poc: 3.6
Myoglobin, poc: >500
Myoglobin, poc: 156
Operator id: 198171
Operator id: 151321
Troponin i, poc: 0.08 — ABNORMAL HIGH

## 2011-06-14 LAB — HEMOGLOBIN A1C
Hgb A1c MFr Bld: 6
Hgb A1c MFr Bld: 6.2 — ABNORMAL HIGH
Mean Plasma Glucose: 136

## 2011-06-14 LAB — DIFFERENTIAL
Basophils Absolute: 0.1
Basophils Absolute: 0.1
Basophils Relative: 1
Eosinophils Absolute: 0
Eosinophils Absolute: 0.1
Eosinophils Relative: 1
Eosinophils Relative: 0
Eosinophils Relative: 1
Lymphocytes Relative: 24
Lymphocytes Relative: 14
Lymphs Abs: 2.3
Lymphs Abs: 1
Lymphs Abs: 2.1
Monocytes Absolute: 0.4
Monocytes Relative: 6
Monocytes Relative: 8
Neutro Abs: 6.2
Neutro Abs: 5.6
Neutrophils Relative %: 79 — ABNORMAL HIGH

## 2011-06-14 LAB — URINALYSIS, ROUTINE W REFLEX MICROSCOPIC
Bilirubin Urine: NEGATIVE
Bilirubin Urine: NEGATIVE
Bilirubin Urine: NEGATIVE
Glucose, UA: NEGATIVE
Glucose, UA: NEGATIVE
Hgb urine dipstick: NEGATIVE
Hgb urine dipstick: NEGATIVE
Ketones, ur: NEGATIVE
Ketones, ur: NEGATIVE
Nitrite: NEGATIVE
Nitrite: NEGATIVE
Protein, ur: NEGATIVE
Protein, ur: NEGATIVE
Specific Gravity, Urine: 1.011
Specific Gravity, Urine: 1.021
Urobilinogen, UA: 0.2
Urobilinogen, UA: 1
pH: 5
pH: 7.5

## 2011-06-14 LAB — BASIC METABOLIC PANEL
BUN: 13
CO2: 22
CO2: 24
CO2: 26
Calcium: 8.5
Calcium: 8.6
Calcium: 9.3
Chloride: 104
Chloride: 96
Creatinine, Ser: 1.07
Creatinine, Ser: 1.22
GFR calc Af Amer: >60
GFR calc Af Amer: >60
GFR calc Af Amer: >60
GFR calc Af Amer: >60
GFR calc Af Amer: >60
GFR calc non Af Amer: >60
GFR calc non Af Amer: >60
Glucose, Bld: 165 — ABNORMAL HIGH
Glucose, Bld: 179 — ABNORMAL HIGH
Potassium: 3.2 — ABNORMAL LOW
Potassium: 4.9
Sodium: 134 — ABNORMAL LOW
Sodium: 131 — ABNORMAL LOW
Sodium: 133 — ABNORMAL LOW
Sodium: 136

## 2011-06-14 LAB — CARDIAC PANEL(CRET KIN+CKTOT+MB+TROPI)
CK, MB: 3.9
Relative Index: INVALID
Relative Index: INVALID
Relative Index: INVALID
Relative Index: INVALID
Total CK: 110
Total CK: 42
Total CK: 67
Troponin I: 0.42 — ABNORMAL HIGH
Troponin I: 0.5

## 2011-06-14 LAB — LIPID PANEL
LDL Cholesterol: 43
VLDL: 37

## 2011-06-14 LAB — CULTURE, BLOOD (ROUTINE X 2)
Culture: NO GROWTH
Culture: NO GROWTH

## 2011-06-14 LAB — AMMONIA: Ammonia: 14

## 2011-06-14 LAB — D-DIMER, QUANTITATIVE
D-Dimer, Quant: 1.11 — ABNORMAL HIGH
D-Dimer, Quant: 1.69 — ABNORMAL HIGH

## 2011-06-14 LAB — TSH
TSH: 1.021
TSH: 0.554

## 2011-06-14 LAB — CK TOTAL AND CKMB (NOT AT ARMC)
CK, MB: 10.8 — ABNORMAL HIGH
CK, MB: 2.7
CK, MB: 4.1 — ABNORMAL HIGH
Relative Index: 1
Relative Index: 1.1
Relative Index: INVALID
Relative Index: INVALID
Total CK: 94
Total CK: 94

## 2011-06-14 LAB — I-STAT 8, (EC8 V) (CONVERTED LAB)
BUN: 17
Bicarbonate: 23.1
Chloride: 103
Glucose, Bld: 135 — ABNORMAL HIGH
HCT: 44
Hemoglobin: 15
Operator id: 196461
Potassium: 3.9
Sodium: 134 — ABNORMAL LOW
TCO2: 24
pCO2, Ven: 33.4 — ABNORMAL LOW
pH, Ven: 7.448 — ABNORMAL HIGH

## 2011-06-14 LAB — POCT I-STAT CREATININE
Creatinine, Ser: 0.9
Operator id: 196461

## 2011-06-14 LAB — CALCIUM: Calcium: 9.3

## 2011-06-14 LAB — URINE CULTURE
Colony Count: NO GROWTH
Special Requests: NEGATIVE

## 2011-06-14 LAB — PHOSPHORUS
Phosphorus: 3.8
Phosphorus: 4.1
Phosphorus: 4.6

## 2011-06-14 LAB — BASIC METABOLIC PANEL WITH GFR
BUN: 26 — ABNORMAL HIGH
Calcium: 8.3 — ABNORMAL LOW
GFR calc non Af Amer: 60 — ABNORMAL LOW
Potassium: 3.7
Sodium: 135

## 2011-06-14 LAB — POTASSIUM: Potassium: 3.5

## 2011-06-14 LAB — B-NATRIURETIC PEPTIDE (CONVERTED LAB)
Pro B Natriuretic peptide (BNP): 225 — ABNORMAL HIGH
Pro B Natriuretic peptide (BNP): 1134 — ABNORMAL HIGH
Pro B Natriuretic peptide (BNP): 262 — ABNORMAL HIGH

## 2011-06-14 LAB — APTT
aPTT: 34
aPTT: 29

## 2011-06-14 LAB — TROPONIN I
Troponin I: 0.04
Troponin I: 0.04
Troponin I: 0.01
Troponin I: 0.02

## 2011-06-14 LAB — HEPATIC FUNCTION PANEL
Alkaline Phosphatase: 38 — ABNORMAL LOW
Alkaline Phosphatase: 60
Indirect Bilirubin: 0.8
Indirect Bilirubin: 0.3
Total Bilirubin: 0.5
Total Protein: 5.1 — ABNORMAL LOW
Total Protein: 7.7

## 2011-06-14 LAB — PROTIME-INR
INR: 0.9
INR: 1
Prothrombin Time: 12.6
Prothrombin Time: 13.4

## 2011-06-14 LAB — CULTURE, BAL-QUANTITATIVE W GRAM STAIN

## 2011-06-14 LAB — SAMPLE TO BLOOD BANK

## 2011-06-14 LAB — T3: T3, Total: 71.3 — ABNORMAL LOW (ref 80.0–204.0)

## 2011-06-14 LAB — LIPASE, BLOOD: Lipase: 28

## 2011-06-14 LAB — MAGNESIUM: Magnesium: 2

## 2011-06-16 LAB — DIFFERENTIAL
Basophils Absolute: 0
Basophils Relative: 0
Eosinophils Relative: 1
Monocytes Absolute: 0.4

## 2011-06-16 LAB — BASIC METABOLIC PANEL
CO2: 28
Calcium: 8.8
Glucose, Bld: 99
Potassium: 3.5
Sodium: 135

## 2011-06-16 LAB — CBC
HCT: 36.5 — ABNORMAL LOW
Hemoglobin: 12.4 — ABNORMAL LOW
MCHC: 34
RDW: 16.3 — ABNORMAL HIGH

## 2011-06-16 LAB — URINALYSIS, ROUTINE W REFLEX MICROSCOPIC
Bilirubin Urine: NEGATIVE
Ketones, ur: NEGATIVE
Nitrite: NEGATIVE
Protein, ur: NEGATIVE
Urobilinogen, UA: 0.2

## 2012-01-31 ENCOUNTER — Other Ambulatory Visit (HOSPITAL_COMMUNITY): Payer: Self-pay | Admitting: Family Medicine

## 2012-01-31 DIAGNOSIS — M25519 Pain in unspecified shoulder: Secondary | ICD-10-CM

## 2012-02-03 ENCOUNTER — Emergency Department (HOSPITAL_COMMUNITY): Payer: Medicare Other

## 2012-02-03 ENCOUNTER — Encounter (HOSPITAL_COMMUNITY): Payer: Self-pay | Admitting: *Deleted

## 2012-02-03 ENCOUNTER — Emergency Department (HOSPITAL_COMMUNITY)
Admission: EM | Admit: 2012-02-03 | Discharge: 2012-02-03 | Disposition: A | Discharge status: Home / Self Care | Payer: Medicare Other | Attending: Emergency Medicine | Admitting: Emergency Medicine

## 2012-02-03 DIAGNOSIS — J449 Chronic obstructive pulmonary disease, unspecified: Secondary | ICD-10-CM | POA: Diagnosis not present

## 2012-02-03 DIAGNOSIS — R51 Headache: Secondary | ICD-10-CM | POA: Diagnosis not present

## 2012-02-03 DIAGNOSIS — S0993XA Unspecified injury of face, initial encounter: Secondary | ICD-10-CM | POA: Diagnosis not present

## 2012-02-03 DIAGNOSIS — J4489 Other specified chronic obstructive pulmonary disease: Secondary | ICD-10-CM | POA: Insufficient documentation

## 2012-02-03 DIAGNOSIS — E119 Type 2 diabetes mellitus without complications: Secondary | ICD-10-CM | POA: Insufficient documentation

## 2012-02-03 DIAGNOSIS — S0990XA Unspecified injury of head, initial encounter: Secondary | ICD-10-CM | POA: Diagnosis not present

## 2012-02-03 DIAGNOSIS — T148XXA Other injury of unspecified body region, initial encounter: Secondary | ICD-10-CM | POA: Diagnosis not present

## 2012-02-03 DIAGNOSIS — W050XXA Fall from non-moving wheelchair, initial encounter: Secondary | ICD-10-CM | POA: Insufficient documentation

## 2012-02-03 DIAGNOSIS — S069X9A Unspecified intracranial injury with loss of consciousness of unspecified duration, initial encounter: Secondary | ICD-10-CM | POA: Diagnosis not present

## 2012-02-03 DIAGNOSIS — I10 Essential (primary) hypertension: Secondary | ICD-10-CM | POA: Diagnosis not present

## 2012-02-03 DIAGNOSIS — F17200 Nicotine dependence, unspecified, uncomplicated: Secondary | ICD-10-CM | POA: Insufficient documentation

## 2012-02-03 DIAGNOSIS — S0101XA Laceration without foreign body of scalp, initial encounter: Secondary | ICD-10-CM

## 2012-02-03 DIAGNOSIS — S0100XA Unspecified open wound of scalp, initial encounter: Secondary | ICD-10-CM | POA: Diagnosis not present

## 2012-02-03 DIAGNOSIS — W19XXXA Unspecified fall, initial encounter: Secondary | ICD-10-CM

## 2012-02-03 HISTORY — DX: Disorder of kidney and ureter, unspecified: N28.9

## 2012-02-03 HISTORY — DX: Chronic obstructive pulmonary disease, unspecified: J44.9

## 2012-02-03 HISTORY — DX: Essential (primary) hypertension: I10

## 2012-02-03 MED ORDER — TETANUS-DIPHTH-ACELL PERTUSSIS 5-2.5-18.5 LF-MCG/0.5 IM SUSP
0.5000 mL | Freq: Once | INTRAMUSCULAR | Status: AC
  Administered 2012-02-03: 0.5 mL via INTRAMUSCULAR
  Filled 2012-02-03: qty 0.5

## 2012-02-03 MED ORDER — BACITRACIN ZINC 500 UNIT/GM EX OINT
TOPICAL_OINTMENT | CUTANEOUS | Status: AC
  Administered 2012-02-03: 22:00:00
  Filled 2012-02-03: qty 1.8

## 2012-02-03 NOTE — Discharge Instructions (Signed)
Wound care for the small scalp laceration to wash with soap and water be gentle apply bacitracin ointment twice a day. Also to the right forearm apply bacitracin ointment to the skin abrasion. Return for any new worse symptoms. Your CT of your head and neck were negative no significant findings no evidence of brain injury or skull fracture may have a mild concussion but nothing serious will become of it. followup with your doctor as needed. Her tetanus was updated today.

## 2012-02-03 NOTE — ED Notes (Signed)
Patient wants his c collar removed

## 2012-02-03 NOTE — ED Provider Notes (Signed)
History   This chart was scribed for Shelda Jakes, MD by Charolett Bumpers . The patient was seen in room APA09/APA09.    CSN: [id number]  Arrival date & time 02/03/12  1959   First MD Initiated Contact with Patient 02/03/12 2005      Chief Complaint  Patient presents with  . Fall    (Consider location/radiation/quality/duration/timing/severity/associated sxs/prior treatment) HPI [name] is a 71 y.o. male who presents to the Emergency Department via EMS complaining of a constant, moderate head injury after a fall that occurred PTA. Patient was brought in with a c-collar and LSB. Patient states that he is normally in a wheel chair. Patient states that he lost control of chair on ramp, fell out of chair, down ramp and struck his head on concrete. Per family, patient had a 2 minute episode of LOC. Patient states that he has an associated laceration on the back of his head where he hit it as well as a headache. Bleeding is controlled here in ED. Patient denies any neck, abdomen, chest or back pain. Patient denies any n/v.  Patient denies any other injuries from the fall. BS checked by EMS, result 98. Nothing makes his symptoms better or worse. Patient states that his Tetanus is out of date.     PCP: Dr. Regino Schultze   Past Medical History  Diagnosis Date  . Diabetes mellitus   . Asthma   . COPD (chronic obstructive pulmonary disease)   . Hypertension   . Renal disorder     Past Surgical History  Procedure Date  . Cardiac surgery   . Nephrectomy   . Cholecystectomy     History reviewed. No pertinent family history.  History  Substance Use Topics  . Smoking status: Current Everyday Smoker -- 1.0 packs/day  . Smokeless tobacco: Not on file  . Alcohol Use: 7.0 oz/week    14 drink(s) per week      Review of Systems  HENT: Negative for neck pain.   Respiratory: Negative for shortness of breath.   Cardiovascular: Negative for chest pain.  Gastrointestinal:  Negative for nausea, vomiting and abdominal pain.  Musculoskeletal: Negative for back pain.  Neurological: Positive for syncope and headaches.  All other systems reviewed and are negative.    Allergies  Erythromycin; Iohexol; and Penicillins  Home Medications   Current Outpatient Rx  Name Route Sig Dispense Refill  . ALBUTEROL SULFATE 0.63 MG/3ML IN NEBU Nebulization Take 1 ampule by nebulization every 6 (six) hours as needed.    . ALBUTEROL SULFATE HFA 108 (90 BASE) MCG/ACT IN AERS Inhalation Inhale 2 puffs into the lungs every 6 (six) hours as needed.    . ASPIRIN 81 MG PO TABS Oral Take 81 mg by mouth daily.    Marland Kitchen CITALOPRAM HYDROBROMIDE 20 MG PO TABS Oral Take 20 mg by mouth daily.     Marland Kitchen DIAZEPAM 10 MG PO TABS Oral Take 10 mg by mouth every 6 (six) hours as needed.    Marland Kitchen METFORMIN HCL 500 MG PO TABS Oral Take 500 mg by mouth 2 (two) times daily with a meal.    . METOPROLOL TARTRATE 50 MG PO TABS Oral Take 50 mg by mouth 2 (two) times daily.    Marland Kitchen RAMIPRIL 1.25 MG PO CAPS Oral Take 1.25 mg by mouth at bedtime.     . TAMSULOSIN HCL 0.4 MG PO CAPS Oral Take 0.4 mg by mouth daily.      BP 113/90  Pulse 98  Temp 97.9 F (36.6 C)  Resp 18  Ht 6\' 3"  (1.905 m)  Wt 200 lb (90.719 kg)  BMI 25.00 kg/m2  SpO2 96%  Physical Exam  Nursing note and vitals reviewed. Constitutional: He is oriented to person, place, and time. He appears well-developed and well-nourished. No distress.  HENT:  Head: Normocephalic and atraumatic.  Mouth/Throat: Oropharynx is clear and moist.  Eyes: EOM are normal. Pupils are equal, round, and reactive to light.  Neck: Neck supple. No tracheal deviation present.  Cardiovascular: Normal rate, regular rhythm and normal heart sounds.   No murmur heard.      Cap refill 1 second.   Pulmonary/Chest: Effort normal and breath sounds normal. No respiratory distress. He has no wheezes.  Abdominal: Soft. Bowel sounds are normal. He exhibits no distension. There is no  tenderness.  Musculoskeletal: Normal range of motion. He exhibits no edema.       Left AKA.   Neurological: He is alert and oriented to person, place, and time. No cranial nerve deficit or sensory deficit. Coordination normal.  Skin: Skin is warm and dry.       1 cm linear occipital laceration with surrounding abrasion. Right forearm, 4 cm abrasion.   Psychiatric: He has a normal mood and affect. His behavior is normal.    ED Course  Procedures (including critical care time)  DIAGNOSTIC STUDIES: Oxygen Saturation is 96% on room air, adequate by my interpretation.    COORDINATION OF CARE:  2020: Discussed planned course of treatment with the patient, who is agreeable at this time.  2030: Medication Orders: TDap (Boostrix) injection 0.5 mL-once.    Labs Reviewed - No data to display Ct Head Wo Contrast  02/03/2012  *RADIOLOGY REPORT*  Clinical Data:  2-minute loss of consciousness following a fall and hitting his head on concrete.  Posterior head laceration and headache.  CT HEAD WITHOUT CONTRAST CT CERVICAL SPINE WITHOUT CONTRAST  Technique:  Multidetector CT imaging of the head and cervical spine was performed following the standard protocol without intravenous contrast.  Multiplanar CT image reconstructions of the cervical spine were also generated.  Comparison:  Previous examinations, the most recent dated 06/10/2010.  CT HEAD  Findings: Stable enlarged ventricles and subarachnoid spaces.  No skull fracture or intracranial hemorrhage.  Bilateral ethmoid sinus mucosal thickening.  Air-fluid levels in the frontal sinuses, right sphenoid sinus and right maxillary sinus.  Almost complete opacification of the left maxillary sinus.  Mild left sphenoid sinus mucosal thickening.  IMPRESSION:  1.  No skull fracture or intracranial hemorrhage. 2.  Chronic and acute pansinusitis. 3.  Stable atrophy.  CT CERVICAL SPINE  Findings: Mild degenerative changes at multiple levels.  No prevertebral soft tissue  swelling, fractures or subluxations. Extensive bilateral carotid artery calcifications.  IMPRESSION:  1.  No fracture or subluxation. 2.  Mild multilevel degenerative changes. 3.  Extensive bilateral carotid artery atheromatous calcifications.  Original Report Authenticated By: Darrol Angel, M.D.   Ct Cervical Spine Wo Contrast  02/03/2012  *RADIOLOGY REPORT*  Clinical Data:  2-minute loss of consciousness following a fall and hitting his head on concrete.  Posterior head laceration and headache.  CT HEAD WITHOUT CONTRAST CT CERVICAL SPINE WITHOUT CONTRAST  Technique:  Multidetector CT imaging of the head and cervical spine was performed following the standard protocol without intravenous contrast.  Multiplanar CT image reconstructions of the cervical spine were also generated.  Comparison:  Previous examinations, the most recent dated 06/10/2010.  CT HEAD  Findings: Stable enlarged ventricles and subarachnoid spaces.  No skull fracture or intracranial hemorrhage.  Bilateral ethmoid sinus mucosal thickening.  Air-fluid levels in the frontal sinuses, right sphenoid sinus and right maxillary sinus.  Almost complete opacification of the left maxillary sinus.  Mild left sphenoid sinus mucosal thickening.  IMPRESSION:  1.  No skull fracture or intracranial hemorrhage. 2.  Chronic and acute pansinusitis. 3.  Stable atrophy.  CT CERVICAL SPINE  Findings: Mild degenerative changes at multiple levels.  No prevertebral soft tissue swelling, fractures or subluxations. Extensive bilateral carotid artery calcifications.  IMPRESSION:  1.  No fracture or subluxation. 2.  Mild multilevel degenerative changes. 3.  Extensive bilateral carotid artery atheromatous calcifications.  Original Report Authenticated By: Darrol Angel, M.D.     1. Fall   2. Scalp laceration   3. Head injury       MDM  Head CT neck CT without sending findings. Closer examination of the occipital area scalp laceration reveals a 1 cm in your  laceration with surrounding abrasion to his not stellate in nature. And also closer examination revealed a right forearm 2 cm skin tear abrasion. Patient tetanus updated in the emergency department wounds do not require suturing or stapling and we treated the with the some irritation and bacitracin ointment. And dressing.  I personally performed the services described in this documentation, which was scribed in my presence. The recorded information has been reviewed and considered.         Shelda Jakes, MD 02/03/12 [phone]

## 2012-02-03 NOTE — ED Notes (Signed)
Ct collar removed  

## 2012-02-03 NOTE — ED Notes (Addendum)
Pt to department via EMS.  Per report, pt is w/c bound at home and lost control of chair on ramp.  Pt fell out of chair, down ramp and struck head on concrete.  Per family, pt did have LOC lasting approximately 2 min.  Pt presently denies memory of accident.  Pt c/o pain where back of head struck ground. Now alert and oriented.  BS checked by EMS, result 98.

## 2012-03-29 DIAGNOSIS — G8929 Other chronic pain: Secondary | ICD-10-CM | POA: Diagnosis not present

## 2012-03-29 DIAGNOSIS — G4700 Insomnia, unspecified: Secondary | ICD-10-CM | POA: Diagnosis not present

## 2012-03-29 DIAGNOSIS — F411 Generalized anxiety disorder: Secondary | ICD-10-CM | POA: Diagnosis not present

## 2012-04-23 ENCOUNTER — Other Ambulatory Visit: Payer: Self-pay | Admitting: *Deleted

## 2012-04-23 DIAGNOSIS — M79606 Pain in leg, unspecified: Secondary | ICD-10-CM

## 2012-04-23 DIAGNOSIS — I7092 Chronic total occlusion of artery of the extremities: Secondary | ICD-10-CM

## 2012-04-23 DIAGNOSIS — E1159 Type 2 diabetes mellitus with other circulatory complications: Secondary | ICD-10-CM

## 2012-04-24 ENCOUNTER — Ambulatory Visit: Payer: PRIVATE HEALTH INSURANCE | Admitting: Neurosurgery

## 2012-04-30 ENCOUNTER — Telehealth: Payer: Self-pay | Admitting: *Deleted

## 2012-04-30 NOTE — Telephone Encounter (Signed)
On 04-23-12, [name] called in saying that he was in severe pain and needed an Oxycontin Rx. He reports that he has increased right foot pain and diminished pulses per Dr. Edison Simon PA 2 weeks ago. Pain is from the knee to the foot. Patient is a diabetic; he is afebrile and says his foot is cold. He was given an appt on 04-23-12 for follow up ABI and LE duplex as ordered by [name] but called later and moved his appt to 04-24-12 because of transportation problems.    04-24-12  Patient called and cancelled his appt with [name] and his vascular labs because of his wife having food poisoning; she is his driver.

## 2012-04-30 NOTE — Telephone Encounter (Signed)
[name] called again re: his foot/ leg pain; he seemed very confused with slurred speech. He couldn't remember calling this morning. I told him to have his wife take him to the ED for evaluation asap.

## 2012-04-30 NOTE — Telephone Encounter (Signed)
Mr [name] cancelled his appt to come in on 04-24-12.  He called in today saying he had very little blood flow in his leg per Dr. Edison Simon PA and needed to be seen. I transferred him to our FD scheduling for an appt asap.

## 2012-05-01 ENCOUNTER — Encounter: Payer: Self-pay | Admitting: Neurosurgery

## 2012-05-02 ENCOUNTER — Ambulatory Visit (INDEPENDENT_AMBULATORY_CARE_PROVIDER_SITE_OTHER): Payer: Medicare Other | Admitting: Vascular Surgery

## 2012-05-02 ENCOUNTER — Ambulatory Visit (INDEPENDENT_AMBULATORY_CARE_PROVIDER_SITE_OTHER): Payer: Medicare Other | Admitting: Neurosurgery

## 2012-05-02 ENCOUNTER — Encounter: Payer: Self-pay | Admitting: Neurosurgery

## 2012-05-02 VITALS — BP 156/99 | HR 107 | Temp 97.9°F | Resp 18 | Ht 75.0 in | Wt 189.0 lb

## 2012-05-02 DIAGNOSIS — I7092 Chronic total occlusion of artery of the extremities: Secondary | ICD-10-CM

## 2012-05-02 DIAGNOSIS — M79609 Pain in unspecified limb: Secondary | ICD-10-CM | POA: Insufficient documentation

## 2012-05-02 DIAGNOSIS — E119 Type 2 diabetes mellitus without complications: Secondary | ICD-10-CM | POA: Insufficient documentation

## 2012-05-02 DIAGNOSIS — I739 Peripheral vascular disease, unspecified: Secondary | ICD-10-CM

## 2012-05-02 DIAGNOSIS — E1159 Type 2 diabetes mellitus with other circulatory complications: Secondary | ICD-10-CM

## 2012-05-02 DIAGNOSIS — I798 Other disorders of arteries, arterioles and capillaries in diseases classified elsewhere: Secondary | ICD-10-CM | POA: Diagnosis not present

## 2012-05-02 DIAGNOSIS — M79606 Pain in leg, unspecified: Secondary | ICD-10-CM

## 2012-05-02 NOTE — Progress Notes (Signed)
Ankle brachial index performed @ [facility] 05/02/2012

## 2012-05-02 NOTE — Progress Notes (Signed)
Right lower extremity arterial duplex performed @ [facility] 05/02/2012

## 2012-05-02 NOTE — Progress Notes (Signed)
[facility] Office Note  CC: Right lower extremity pain Referring Physician: Brabham  History of Present Illness: 71 year old male patient of Dr. Myra Gianotti who has not been seen in the clinic in over 3 years. The patient is status post left above knee amputation after a below-knee amputation complication. The patient states his right lower extremity has been painful over the past 6 months with some "knots" that are transient around his calf as well as discoloration and swelling in his lower extremity. The patient also complains of having phantom pain on the left side 2-3 years after his amputation.  Past Medical History  Diagnosis Date  . Diabetes mellitus   . Asthma   . COPD (chronic obstructive pulmonary disease)   . Hypertension   . Renal disorder     ROS: [x]  Positive   [ ]  Denies    General: [ ]  Weight loss, [ ]  Fever, [ ]  chills Neurologic: [ ]  Dizziness, [ ]  Blackouts, [ ]  Seizure [ ]  Stroke, [ ]  "Mini stroke", [ ]  Slurred speech, [ ]  Temporary blindness; [ ]  weakness in arms or legs, [ ]  Hoarseness Cardiac: [ ]  Chest pain/pressure, [ ]  Shortness of breath at rest [ ]  Shortness of breath with exertion, [ ]  Atrial fibrillation or irregular heartbeat Vascular: [ ]  Pain in legs with walking, [ ]  Pain in legs at rest, [ ]  Pain in legs at night,  [ ]  Non-healing ulcer, [ ]  Blood clot in vein/DVT,   Pulmonary: [ ]  Home oxygen, [ ]  Productive cough, [ ]  Coughing up blood, [ ]  Asthma,  [ ]  Wheezing Musculoskeletal:  [ ]  Arthritis, [ ]  Low back pain, [ ]  Joint pain Hematologic: [ ]  Easy Bruising, [ ]  Anemia; [ ]  Hepatitis Gastrointestinal: [ ]  Blood in stool, [ ]  Gastroesophageal Reflux/heartburn, [ ]  Trouble swallowing Urinary: [ ]  chronic Kidney disease, [ ]  on HD - [ ]  MWF or [ ]  TTHS, [ ]  Burning with urination, [ ]  Difficulty urinating Skin: [ ]  Rashes, [ ]  Wounds Psychological: [ ]  Anxiety, [ ]  Depression   Social History History  Substance  Use Topics  . Smoking status: Current Everyday Smoker -- 1.0 packs/day  . Smokeless tobacco: Not on file  . Alcohol Use: 7.0 oz/week    14 drink(s) per week    Family History Family History  Problem Relation Age of Onset  . Cancer Mother   . Heart attack Father     Allergies  Allergen Reactions  . Erythromycin   . Iohexol      Code: HIVES, Desc: PT REPORTS THROAT SWELLING, HIVES AND ITCHING, 13 HR PRE-MED GIVEN AND SUCCESSFUL-ARS 07/15/07   . Penicillins     Current Outpatient Prescriptions  Medication Sig Dispense Refill  . albuterol (PROAIR HFA) 108 (90 BASE) MCG/ACT inhaler Inhale 2 puffs into the lungs every 6 (six) hours as needed.      Marland Kitchen aspirin 81 MG tablet Take 162 mg by mouth every evening.       . citalopram (CELEXA) 20 MG tablet Take 20 mg by mouth daily.       . diazepam (VALIUM) 10 MG tablet Take 10 mg by mouth every 6 (six) hours as needed.      Marland Kitchen HYDROcodone-acetaminophen (NORCO) 7.5-325 MG per tablet Take 1 tablet by mouth every 4 (four) hours as needed. FOR PAIN      . metFORMIN (GLUCOPHAGE) 500 MG tablet Take 500 mg by mouth 2 (two)  times daily with a meal.      . metoprolol (LOPRESSOR) 50 MG tablet Take 50 mg by mouth 2 (two) times daily.      . ramipril (ALTACE) 1.25 MG capsule Take 1.25 mg by mouth every evening.       . Tamsulosin HCl (FLOMAX) 0.4 MG CAPS Take 0.4 mg by mouth 2 (two) times daily.       . Triazolam (HALCION PO) Take 1 tablet by mouth at bedtime.      Marland Kitchen zolpidem (AMBIEN) 10 MG tablet Take 10-20 mg by mouth at bedtime as needed.        Physical Examination  Filed Vitals:   05/02/12 1505  BP: 156/99  Pulse: 107  Temp: 97.9 F (36.6 C)  Resp: 18    Body mass index is 23.62 kg/(m^2).  General:  WDWN in NAD Gait: Normal HEENT: WNL Eyes: Pupils equal Pulmonary: normal non-labored breathing , without Rales, rhonchi,  wheezing Cardiac: RRR, without  Murmurs, rubs or gallops; No carotid bruits Abdomen: soft, NT, no masses Skin: no  rashes, ulcers noted Vascular Exam/Pulses: Posterior tibial and dorsalis pedis pulses are heard with Doppler on the right foot  Extremities without ischemic changes, no Gangrene , no cellulitis; no open wounds;  Musculoskeletal: no muscle wasting or atrophy  Neurologic: A&O X 3; Appropriate Affect ; SENSATION: normal; MOTOR FUNCTION:  moving all extremities equally. Speech is fluent/normal  Non-Invasive Vascular Imaging: ABIs today are 0.80 and biphasic to monophasic on the right, right lower 70 duplex does show some diffuse calcified plaque present throughout right lower extremity arterial system, hemodynamically significant stenosis greater than 50% involving the right proximal superficial femoral artery and distal popliteal artery.  ASSESSMENT/PLAN: The patient does have 1+ pitting edema in his foot however his foot is well perfused, there is no ischemia or significant discoloration. Pulses are found easily with Doppler. The patient states he does take Lasix per his PCP. I offered to discuss the lab findings with Dr. Edilia Bo who could make recommendations for followup with Dr. Myra Gianotti, however the patient states he would rather come back and talk to Dr. Myra Gianotti himself as he is in a hurry today due to his granddaughter. We will schedule the patient to come back in the next 3-4 weeks to discuss any options with Dr. Myra Gianotti, the patient is in agreement with this and has requested this. The patient's questions were encouraged and answered.  Lauree Chandler ANP  Clinic M.D.: Edilia Bo

## 2012-06-01 ENCOUNTER — Encounter: Payer: Self-pay | Admitting: Surgery

## 2012-06-04 ENCOUNTER — Ambulatory Visit: Payer: Medicare Other | Admitting: Surgery

## 2012-08-16 DIAGNOSIS — T1490XA Injury, unspecified, initial encounter: Secondary | ICD-10-CM | POA: Diagnosis not present

## 2012-08-16 DIAGNOSIS — S064X9A Epidural hemorrhage with loss of consciousness of unspecified duration, initial encounter: Secondary | ICD-10-CM | POA: Diagnosis not present

## 2012-09-05 DIAGNOSIS — I739 Peripheral vascular disease, unspecified: Secondary | ICD-10-CM

## 2012-09-05 HISTORY — DX: Peripheral vascular disease, unspecified: I73.9

## 2012-09-17 DIAGNOSIS — E119 Type 2 diabetes mellitus without complications: Secondary | ICD-10-CM | POA: Diagnosis not present

## 2012-09-17 DIAGNOSIS — M7150 Other bursitis, not elsewhere classified, unspecified site: Secondary | ICD-10-CM | POA: Diagnosis not present

## 2012-09-17 DIAGNOSIS — Z681 Body mass index (BMI) 19 or less, adult: Secondary | ICD-10-CM | POA: Diagnosis not present

## 2012-09-17 DIAGNOSIS — G609 Hereditary and idiopathic neuropathy, unspecified: Secondary | ICD-10-CM | POA: Diagnosis not present

## 2012-09-17 DIAGNOSIS — M159 Polyosteoarthritis, unspecified: Secondary | ICD-10-CM | POA: Diagnosis not present

## 2012-11-19 ENCOUNTER — Telehealth: Payer: Self-pay

## 2012-11-19 NOTE — Telephone Encounter (Signed)
Pt. Called to report excessive swelling in the right foot and extends just above the (R) ankle.  States the swelling started about 2 mos. Ago.  C/o a lot of pain in the right foot and lower leg, up to the knee; further explains that now "there is a black and blue discoloration from the right great toe to the sock line".  States this discoloration has worsened over the last several days.   Advised that pt. Needs to go to the ER with the current severity of symptoms.  Dr. Myra Gianotti made aware of the advise to go to the ER.  Agrees with advise.

## 2013-01-01 DIAGNOSIS — N189 Chronic kidney disease, unspecified: Secondary | ICD-10-CM | POA: Diagnosis not present

## 2013-01-01 DIAGNOSIS — Z125 Encounter for screening for malignant neoplasm of prostate: Secondary | ICD-10-CM | POA: Diagnosis not present

## 2013-01-01 DIAGNOSIS — M259 Joint disorder, unspecified: Secondary | ICD-10-CM | POA: Diagnosis not present

## 2013-01-01 DIAGNOSIS — I10 Essential (primary) hypertension: Secondary | ICD-10-CM | POA: Diagnosis not present

## 2013-01-01 DIAGNOSIS — I2510 Atherosclerotic heart disease of native coronary artery without angina pectoris: Secondary | ICD-10-CM | POA: Diagnosis not present

## 2013-01-01 DIAGNOSIS — Z681 Body mass index (BMI) 19 or less, adult: Secondary | ICD-10-CM | POA: Diagnosis not present

## 2013-01-31 ENCOUNTER — Other Ambulatory Visit (HOSPITAL_COMMUNITY): Payer: Self-pay | Admitting: Internal Medicine

## 2013-01-31 ENCOUNTER — Ambulatory Visit (HOSPITAL_COMMUNITY)
Admission: RE | Admit: 2013-01-31 | Discharge: 2013-01-31 | Disposition: A | Discharge status: Home / Self Care | Payer: Medicare Other | Source: Ambulatory Visit | Attending: Internal Medicine | Admitting: Internal Medicine

## 2013-01-31 DIAGNOSIS — M79642 Pain in left hand: Secondary | ICD-10-CM

## 2013-01-31 DIAGNOSIS — Z4789 Encounter for other orthopedic aftercare: Secondary | ICD-10-CM | POA: Diagnosis not present

## 2013-01-31 DIAGNOSIS — M25549 Pain in joints of unspecified hand: Secondary | ICD-10-CM | POA: Diagnosis not present

## 2013-01-31 DIAGNOSIS — G8929 Other chronic pain: Secondary | ICD-10-CM | POA: Diagnosis not present

## 2013-01-31 DIAGNOSIS — G4700 Insomnia, unspecified: Secondary | ICD-10-CM | POA: Diagnosis not present

## 2013-01-31 DIAGNOSIS — Z681 Body mass index (BMI) 19 or less, adult: Secondary | ICD-10-CM | POA: Diagnosis not present

## 2013-01-31 DIAGNOSIS — M25539 Pain in unspecified wrist: Secondary | ICD-10-CM | POA: Insufficient documentation

## 2013-01-31 DIAGNOSIS — M19039 Primary osteoarthritis, unspecified wrist: Secondary | ICD-10-CM | POA: Diagnosis not present

## 2013-02-04 DIAGNOSIS — M25539 Pain in unspecified wrist: Secondary | ICD-10-CM | POA: Diagnosis not present

## 2013-02-04 DIAGNOSIS — S62109A Fracture of unspecified carpal bone, unspecified wrist, initial encounter for closed fracture: Secondary | ICD-10-CM | POA: Diagnosis not present

## 2013-04-15 DIAGNOSIS — R69 Illness, unspecified: Secondary | ICD-10-CM | POA: Diagnosis not present

## 2013-04-27 DIAGNOSIS — R69 Illness, unspecified: Secondary | ICD-10-CM | POA: Diagnosis not present

## 2013-05-03 DIAGNOSIS — R5381 Other malaise: Secondary | ICD-10-CM | POA: Diagnosis not present

## 2013-05-03 DIAGNOSIS — R404 Transient alteration of awareness: Secondary | ICD-10-CM | POA: Diagnosis not present

## 2013-05-13 ENCOUNTER — Telehealth: Payer: Self-pay

## 2013-05-13 DIAGNOSIS — I739 Peripheral vascular disease, unspecified: Secondary | ICD-10-CM

## 2013-05-13 DIAGNOSIS — M79609 Pain in unspecified limb: Secondary | ICD-10-CM

## 2013-05-13 NOTE — Telephone Encounter (Signed)
Phone call from wife.  States pt. has a lot of swelling in the right foot up to the knee x 1 month.  Reports has sores on the right foot middle toe and the sole of foot.  Reports pt. has had several falls over past several weeks, and had to call the EMT's to assist him to get up.  Wife reports pt. has had increasing weakness, and lack of appetite.  Is on narcotic pain medication for pain in right hand and shoulder; relates hx. of fracture of right arm in June.  Wife states pt. also c/o pain in right foot and lower leg up to the knee.  Reports right knee is swollen.  Pt. got on phone after speaking to wife; stated his "right foot feels frozen".   Advised wife and patient to report his weakness, lack of appetite, and frequent falls to his PCP.  Wife stated the PCP knows about all of the above, and recommended pt. To see his vascular MD.  Discussed w/ Dr. Myra Gianotti.  Recommends appt. 9/9 with ABI's and MD visit.

## 2013-05-14 ENCOUNTER — Telehealth: Payer: Self-pay | Admitting: Surgery

## 2013-05-14 ENCOUNTER — Ambulatory Visit: Payer: Medicare Other | Admitting: Vascular Surgery

## 2013-05-14 ENCOUNTER — Encounter: Payer: Self-pay | Admitting: Vascular Surgery

## 2013-05-14 NOTE — Telephone Encounter (Signed)
Pt was offered appt. today for evaluation at 1:00 PM in vascular lab, and at 1:45 PM with  MD here at the office.  Pt. declined coming to office today, stating he has to take care of a 70 yr. old grandchild.  Encouraged pt. to try to make arrangements to keep appt. today.  Pt. was clear that he could not come in today.  Advised pt. the office will contact him to reschedule his appt.  Pt. called back at 3:25 PM, and asked if he went to the ER tomorrow morning, could either Dr. Hart Rochester or Dr. Myra Gianotti take care of him?  Advised him that if he went to the ER, he would be evaluated by the MD on staff, and the ER MD would determine if a vascular surgeon was needed.  Pt. stated he is miserable with pain in his right foot and knee.  Encouraged pt. to go to the ER tonight, since he cannot get there this afternoon.  Stated he may go tonight, but it would be very late this evening.  Encouraged pt. to seek medical evaluation for his multiple problems.  Verb. Understanding.

## 2013-05-14 NOTE — Telephone Encounter (Signed)
Notified patient of fu appointment with dr. Myra Gianotti on 05-20-13 at 10:30

## 2013-05-17 ENCOUNTER — Encounter: Payer: Self-pay | Admitting: Surgery

## 2013-05-20 ENCOUNTER — Ambulatory Visit: Payer: Medicare Other | Admitting: Surgery

## 2013-05-20 ENCOUNTER — Telehealth: Payer: Self-pay | Admitting: *Deleted

## 2013-05-20 NOTE — Telephone Encounter (Signed)
[name] called to ask to reschedule his appt from today that he had to cancel because his car was broke down. He complained of awful on a scale of 1-10 a 10 phantom pain in his Left amputated site. He also said that for off & on the last 6 months his right leg is cold. He said he needed a knee replacement but orthopedics would not do one because of all his illnesses. He wants to see Dr Myra Gianotti or Dr Hart Rochester as soon as possible. I offered him tomorrow but he couldn't because of transportation. I offered him Thrusday 9/18 to see Dr Darrick Penna but he couldn't because he had to pick up his grandchild at school, that he has custody of. I then offered him Monday 05/27/13 with Dr Myra Gianotti and he made an appt for then.

## 2013-05-24 ENCOUNTER — Encounter: Payer: Self-pay | Admitting: Surgery

## 2013-05-27 ENCOUNTER — Ambulatory Visit (INDEPENDENT_AMBULATORY_CARE_PROVIDER_SITE_OTHER): Payer: Medicare Other | Admitting: Surgery

## 2013-05-27 ENCOUNTER — Encounter (INDEPENDENT_AMBULATORY_CARE_PROVIDER_SITE_OTHER): Payer: Medicare Other | Admitting: Vascular Surgery

## 2013-05-27 ENCOUNTER — Encounter: Payer: Self-pay | Admitting: Surgery

## 2013-05-27 VITALS — BP 124/76 | HR 104 | Temp 97.8°F | Ht 75.0 in | Wt 165.0 lb

## 2013-05-27 DIAGNOSIS — Z48812 Encounter for surgical aftercare following surgery on the circulatory system: Secondary | ICD-10-CM | POA: Diagnosis not present

## 2013-05-27 DIAGNOSIS — M79609 Pain in unspecified limb: Secondary | ICD-10-CM

## 2013-05-27 DIAGNOSIS — I739 Peripheral vascular disease, unspecified: Secondary | ICD-10-CM

## 2013-05-27 NOTE — Progress Notes (Signed)
[facility] [place]   Patient name: [name] MRN: [id number] DOB: April 04, 1941 Sex: male     Chief Complaint  Patient presents with  . Re-evaluation    pt c/o pain and swelling in R LE     HISTORY OF PRESENT ILLNESS: The patient comes in today for evaluation of his right leg. He is well known to our practice, having undergone left below, converted to above-knee and dictation approximately 3 years ago. Patient states that he has been having significant pain in his right leg. He states that it happens mostly at night and is in his knee. He also describes his right foot as being very cold. He has taken multiple narcotics in the past but currently does not have any and his disposal. He is not sleeping or eating secondary to the pain. He also complains of swelling in his right leg. He does keep his leg elevated.  Past Medical History  Diagnosis Date  . Diabetes mellitus   . Asthma   . COPD (chronic obstructive pulmonary disease)   . Hypertension   . Renal disorder     Past Surgical History  Procedure Laterality Date  . Cardiac surgery    . Nephrectomy    . Cholecystectomy    . Amputation      History   Social History  . Marital Status: Married    Spouse Name: N/A    Number of Children: N/A  . Years of Education: N/A   Occupational History  . Not on file.   Social History Main Topics  . Smoking status: Current Every Day Smoker -- 1.00 packs/day  . Smokeless tobacco: Not on file  . Alcohol Use: 7.0 oz/week    14 drink(s) per week  . Drug Use: No  . Sexual Activity: Not on file   Other Topics Concern  . Not on file   Social History Narrative  . No narrative on file    Family History  Problem Relation Age of Onset  . Cancer Mother   . Heart attack Father     Allergies as of 05/27/2013 - Review Complete 05/27/2013  Allergen Reaction Noted  . Erythromycin    . Iohexol  07/15/2007  . Penicillins      Current Outpatient Prescriptions  on File Prior to Visit  Medication Sig Dispense Refill  . albuterol (PROAIR HFA) 108 (90 BASE) MCG/ACT inhaler Inhale 2 puffs into the lungs every 6 (six) hours as needed.      Marland Kitchen aspirin 81 MG tablet Take 162 mg by mouth every evening.       . citalopram (CELEXA) 20 MG tablet Take 20 mg by mouth daily.       . diazepam (VALIUM) 10 MG tablet Take 10 mg by mouth every 6 (six) hours as needed.      . gabapentin (NEURONTIN) 100 MG capsule Take 100 mg by mouth 2 (two) times daily.      Marland Kitchen HYDROcodone-acetaminophen (NORCO) 7.5-325 MG per tablet Take 1 tablet by mouth every 4 (four) hours as needed. FOR PAIN      . metFORMIN (GLUCOPHAGE) 500 MG tablet Take 500 mg by mouth 2 (two) times daily with a meal.      . metoprolol (LOPRESSOR) 50 MG tablet Take 50 mg by mouth 2 (two) times daily.      Marland Kitchen oxyCODONE-acetaminophen (PERCOCET) 10-325 MG per tablet Take 1 tablet by mouth as needed.      . promethazine (PHENERGAN) 25  MG tablet Take 25 mg by mouth every 6 (six) hours as needed.      . ramipril (ALTACE) 1.25 MG capsule Take 1.25 mg by mouth every evening.       . Tamsulosin HCl (FLOMAX) 0.4 MG CAPS Take 0.4 mg by mouth 2 (two) times daily.       . Triazolam (HALCION PO) Take 1 tablet by mouth at bedtime.      Marland Kitchen zolpidem (AMBIEN) 10 MG tablet Take 10-20 mg by mouth at bedtime as needed.       No current facility-administered medications on file prior to visit.     REVIEW OF SYSTEMS: Cardiovascular: Positive for chest pain, shortness of breath on lying flat and with exertion, pain in his feet when lying flat, right leg swelling Pulmonary: Positive for productive cough and wheezing Neurologic: Positive for numbness in his arms and legs and dizziness Hematologic: No bleeding problems or clotting disorders. Musculoskeletal: No joint pain or joint swelling. Gastrointestinal: Also has 2 blood in stool Genitourinary: Positive for history of blood in the urine Psychiatric:: No history of major  depression. Integumentary: No rashes or ulcers. Constitutional: No fever or chills.  PHYSICAL EXAMINATION:   Vital signs are BP 124/76  Pulse 104  Temp(Src) 97.8 F (36.6 C) (Oral)  Ht 6\' 3"  (1.905 m)  Wt 165 lb (74.844 kg)  BMI 20.62 kg/m2  SpO2 99% General: The patient appears their stated age. HEENT:  No gross abnormalities Pulmonary:  Non labored breathing Abdomen: Soft and non-tender Musculoskeletal: Left leg is surgically absent Neurologic: No focal weakness or paresthesias are detected, Skin: There are no ulcer or rashes noted. Psychiatric: The patient has normal affect. Cardiovascular: No palpable pulse in the right leg. The right leg is somewhat cool to the touch. He has 2+ edema. Motor and sensory function are intact.   Diagnostic Studies ABI today is 0.62. This is a decrease from 2013 where it was 0.80  Assessment: Right leg pain Plan: Blade the patient describes his pain, I do not think that it is predominantly vascular. His symptoms occur at night while he is resting. He describes significant knee pain. I do believe there is a component of arterial insufficiency, however with his limited activity I don't think that this is the main issue. He has been evaluated by orthopedics in the past, therefore I will refer him back to them to see if there are any nonsurgical treatment of the discomfort he is having in his knee. In addition, I am increasing his Neurontin to 300 mg 3 times a day. I gave him a prescription for Tylox at 2 mg, 50 tablets. He will also be fitted for a 15-20 mm thigh-high graded compression stockings to assist with the swelling. I will see him back in the office in one month. If he continues to have pain I told her that I would entertain performing angiography to evaluate the blood flow in his right leg and intervening to see if this gives him any improvement. I would do this if we have exhausted all other treatment options  V. Charlena Cross,  M.D. [facility] Office: [phone] Pager:  [phone]

## 2013-05-31 ENCOUNTER — Telehealth: Payer: Self-pay | Admitting: *Deleted

## 2013-05-31 NOTE — Telephone Encounter (Signed)
Phone call returned to patient. Spoke with wife because she had just helped him in the shower. Informed of Dr Estanislado Spire recommendation for patient to go to the emergency room for further evaluation of right leg pain; he told his wife to forget it; he will call us again Monday. I explained to her that Dr Myra Gianotti would recommend this same thing Monday. She said he was difficult to deal with and she would try to talk him into going.( pt seen on 9/22 and advised to go to orthopedic specialists for right knee pain. After many calls to our front office staff, he cancelled all those appointments, stating he only wants Dr Myra Gianotti to care for him)

## 2013-06-07 NOTE — Addendum Note (Signed)
Addended by: Adria Dill L on: 06/07/2013 03:11 PM   Modules accepted: Orders

## 2013-06-24 DIAGNOSIS — R69 Illness, unspecified: Secondary | ICD-10-CM | POA: Diagnosis not present

## 2013-06-25 DIAGNOSIS — T1490XA Injury, unspecified, initial encounter: Secondary | ICD-10-CM | POA: Diagnosis not present

## 2013-06-28 ENCOUNTER — Encounter: Payer: Self-pay | Admitting: Surgery

## 2013-07-01 ENCOUNTER — Ambulatory Visit (INDEPENDENT_AMBULATORY_CARE_PROVIDER_SITE_OTHER): Payer: Medicare Other | Admitting: Surgery

## 2013-07-01 ENCOUNTER — Encounter: Payer: Self-pay | Admitting: Surgery

## 2013-07-01 VITALS — BP 131/87 | HR 91 | Ht 75.0 in | Wt 165.0 lb

## 2013-07-01 DIAGNOSIS — M79605 Pain in left leg: Secondary | ICD-10-CM | POA: Insufficient documentation

## 2013-07-01 DIAGNOSIS — I739 Peripheral vascular disease, unspecified: Secondary | ICD-10-CM

## 2013-07-01 DIAGNOSIS — M79609 Pain in unspecified limb: Secondary | ICD-10-CM | POA: Diagnosis not present

## 2013-07-01 NOTE — Progress Notes (Signed)
[facility] [place]   Patient name: [name] MRN: [id number] DOB: 1941/05/02 Sex: male     Chief Complaint  Patient presents with  . Re-evaluation    1 month f/u     HISTORY OF PRESENT ILLNESS: The patient comes back for followup evaluation of his right leg.  He is well known to me, having undergone left below, converted to above-knee amputation approximately 3 years ago.  He has been having significant pain in his right leg.  This is mostly at night and in his knee.  He describes his right foot as being very cold.  His symptoms are somewhat treated with narcotics.  He has trouble sleeping and eating secondary to the pain.  He also complains of swelling in his right leg.  I tried to get into see an orthopedist, however he did not do this.  I placed him in low-grade 15-20 mm compression stockings for his swelling which did have some impact.  He has fallen a lot since I last saw him because of the discomfort in his foot.  Past Medical History  Diagnosis Date  . Diabetes mellitus   . Asthma   . COPD (chronic obstructive pulmonary disease)   . Hypertension   . Renal disorder     Past Surgical History  Procedure Laterality Date  . Cardiac surgery    . Nephrectomy    . Cholecystectomy    . Amputation      History   Social History  . Marital Status: Married    Spouse Name: N/A    Number of Children: N/A  . Years of Education: N/A   Occupational History  . Not on file.   Social History Main Topics  . Smoking status: Current Every Day Smoker -- 1.00 packs/day    Types: Cigarettes  . Smokeless tobacco: Not on file     Comment: pt states that he is trying to quit  . Alcohol Use: 7.0 oz/week    14 drink(s) per week  . Drug Use: No  . Sexual Activity: Not on file   Other Topics Concern  . Not on file   Social History Narrative  . No narrative on file    Family History  Problem Relation Age of Onset  . Cancer Mother   . Heart attack Father      Allergies as of 07/01/2013 - Review Complete 07/01/2013  Allergen Reaction Noted  . Erythromycin    . Iohexol  07/15/2007  . Penicillins      Current Outpatient Prescriptions on File Prior to Visit  Medication Sig Dispense Refill  . albuterol (PROAIR HFA) 108 (90 BASE) MCG/ACT inhaler Inhale 2 puffs into the lungs every 6 (six) hours as needed.      Marland Kitchen aspirin 81 MG tablet Take 162 mg by mouth every evening.       . citalopram (CELEXA) 20 MG tablet Take 20 mg by mouth daily.       . diazepam (VALIUM) 10 MG tablet Take 10 mg by mouth every 6 (six) hours as needed.      . gabapentin (NEURONTIN) 100 MG capsule Take 100 mg by mouth 2 (two) times daily.      Marland Kitchen HYDROcodone-acetaminophen (NORCO) 7.5-325 MG per tablet Take 1 tablet by mouth every 4 (four) hours as needed. FOR PAIN      . metFORMIN (GLUCOPHAGE) 500 MG tablet Take 500 mg by mouth 2 (two) times daily with a meal.      .  metoprolol (LOPRESSOR) 50 MG tablet Take 50 mg by mouth 2 (two) times daily.      Marland Kitchen oxyCODONE-acetaminophen (PERCOCET) 10-325 MG per tablet Take 1 tablet by mouth as needed.      . promethazine (PHENERGAN) 25 MG tablet Take 25 mg by mouth every 6 (six) hours as needed.      . ramipril (ALTACE) 1.25 MG capsule Take 1.25 mg by mouth every evening.       . Tamsulosin HCl (FLOMAX) 0.4 MG CAPS Take 0.4 mg by mouth 2 (two) times daily.       . Triazolam (HALCION PO) Take 1 tablet by mouth at bedtime.      Marland Kitchen zolpidem (AMBIEN) 10 MG tablet Take 10-20 mg by mouth at bedtime as needed.       No current facility-administered medications on file prior to visit.     REVIEW OF SYSTEMS: Please see history of present illness, otherwise no changes from most recent visit.  PHYSICAL EXAMINATION:   Vital signs are BP 131/87  Pulse 91  Ht 6\' 3"  (1.905 m)  Wt 165 lb (74.844 kg)  BMI 20.62 kg/m2  SpO2 98% General: The patient appears their stated age. HEENT:  No gross abnormalities Pulmonary:  Non labored  breathing Abdomen: Soft and non-tender Musculoskeletal: Left above-knee amputation Neurologic: No focal weakness or paresthesias are detected, Skin: There are no ulcer or rashes noted. Psychiatric: The patient has normal affect. Cardiovascular:  Nonpalpable pedal pulses   Diagnostic Studies None  Assessment: Right leg pain Plan: The patient is having significant amount of pain in his right leg.  He has been told in the past that he is not a candidate for orthopedic procedures.  I do feel that he has vascular disease in his leg, his most recent ABI was 0.6.  However, I am not convinced that this is contributing to any of his problems.  I discussed this with the patient.  I told him that I would consider proceeding with angiography to evaluate his anatomy and potentially treat any stenosis that was encountered.  I was very realistic with him, explaining that this may have any impact on his right leg pain.  In addition we discussed the potential complications including embolization and need for emergent surgery.  The patient feels very strongly about proceeding, even though it may not have any impact on his pain.  His procedure is scheduled for Tuesday, November 4  V. Charlena Cross, M.D. [facility] [place] Office: [phone] Pager:  [phone]

## 2013-07-02 ENCOUNTER — Other Ambulatory Visit: Payer: Self-pay

## 2013-07-04 ENCOUNTER — Encounter (HOSPITAL_COMMUNITY): Payer: Self-pay

## 2013-07-04 DIAGNOSIS — R69 Illness, unspecified: Secondary | ICD-10-CM | POA: Diagnosis not present

## 2013-07-05 ENCOUNTER — Other Ambulatory Visit: Payer: Self-pay

## 2013-07-05 ENCOUNTER — Telehealth: Payer: Self-pay

## 2013-07-05 MED ORDER — DIPHENHYDRAMINE HCL 50 MG PO CAPS: ORAL_CAPSULE | ORAL | Status: DC

## 2013-07-05 MED ORDER — PREDNISONE 50 MG PO TABS: ORAL_TABLET | ORAL | Status: DC

## 2013-07-05 NOTE — Telephone Encounter (Signed)
Phone call to pt. To advise of Dr. Estanislado Spire recommendation to follow the 13 hr. Prednisone Prep, prior to the right leg angiogram 11/4 due to Iohexol allergy.  Advised pt. Will fax Rx to pharmacy for Prednisone 50 mg po, at 13 hrs., 7hrs., and to take Prednisone 50 mg and Benadry 1 hr. Prior to the procedure on 11/4.  Pt. Verb. Understanding.  Pt. C/o having increased swelling from just above the (R) ankle to below the knee over past 2-3 days.  Describes his right  Calf as being very large.  Doesn't ambulate.  States the right calf is very tender.  Denies any redness or warmth of the right lower leg.  Will make Dr. Myra Gianotti aware of symptoms.

## 2013-07-09 ENCOUNTER — Encounter (HOSPITAL_COMMUNITY): Payer: Self-pay | Admitting: General Practice

## 2013-07-09 ENCOUNTER — Encounter (HOSPITAL_COMMUNITY)
Admission: RE | Disposition: A | Discharge status: Home / Self Care | Payer: Self-pay | Source: Ambulatory Visit | Attending: Surgery

## 2013-07-09 ENCOUNTER — Ambulatory Visit (HOSPITAL_COMMUNITY)
Admission: RE | Admit: 2013-07-09 | Discharge: 2013-07-10 | Disposition: A | Discharge status: Home / Self Care | Payer: Medicare Other | Source: Ambulatory Visit | Attending: Surgery | Admitting: Surgery

## 2013-07-09 ENCOUNTER — Other Ambulatory Visit: Payer: Self-pay | Admitting: *Deleted

## 2013-07-09 ENCOUNTER — Telehealth: Payer: Self-pay | Admitting: Surgery

## 2013-07-09 DIAGNOSIS — J4489 Other specified chronic obstructive pulmonary disease: Secondary | ICD-10-CM | POA: Insufficient documentation

## 2013-07-09 DIAGNOSIS — E119 Type 2 diabetes mellitus without complications: Secondary | ICD-10-CM | POA: Diagnosis not present

## 2013-07-09 DIAGNOSIS — I10 Essential (primary) hypertension: Secondary | ICD-10-CM | POA: Diagnosis not present

## 2013-07-09 DIAGNOSIS — I739 Peripheral vascular disease, unspecified: Secondary | ICD-10-CM

## 2013-07-09 DIAGNOSIS — J449 Chronic obstructive pulmonary disease, unspecified: Secondary | ICD-10-CM | POA: Insufficient documentation

## 2013-07-09 DIAGNOSIS — Z79899 Other long term (current) drug therapy: Secondary | ICD-10-CM | POA: Insufficient documentation

## 2013-07-09 DIAGNOSIS — I70229 Atherosclerosis of native arteries of extremities with rest pain, unspecified extremity: Secondary | ICD-10-CM | POA: Diagnosis not present

## 2013-07-09 DIAGNOSIS — M7989 Other specified soft tissue disorders: Secondary | ICD-10-CM

## 2013-07-09 DIAGNOSIS — Z48812 Encounter for surgical aftercare following surgery on the circulatory system: Secondary | ICD-10-CM

## 2013-07-09 DIAGNOSIS — F1010 Alcohol abuse, uncomplicated: Secondary | ICD-10-CM | POA: Insufficient documentation

## 2013-07-09 DIAGNOSIS — N289 Disorder of kidney and ureter, unspecified: Secondary | ICD-10-CM | POA: Insufficient documentation

## 2013-07-09 DIAGNOSIS — Z9181 History of falling: Secondary | ICD-10-CM | POA: Insufficient documentation

## 2013-07-09 DIAGNOSIS — I70219 Atherosclerosis of native arteries of extremities with intermittent claudication, unspecified extremity: Secondary | ICD-10-CM | POA: Diagnosis not present

## 2013-07-09 DIAGNOSIS — M79609 Pain in unspecified limb: Secondary | ICD-10-CM

## 2013-07-09 HISTORY — DX: Rheumatic fever without heart involvement: I00

## 2013-07-09 HISTORY — DX: Personal history of other diseases of the digestive system: Z87.19

## 2013-07-09 HISTORY — PX: LOWER EXTREMITY ANGIOGRAM: SHX5955

## 2013-07-09 HISTORY — DX: Cardiac murmur, unspecified: R01.1

## 2013-07-09 HISTORY — DX: Unspecified osteoarthritis, unspecified site: M19.90

## 2013-07-09 HISTORY — DX: Shortness of breath: R06.02

## 2013-07-09 HISTORY — DX: Malignant neoplasm of unspecified ureter: C66.9

## 2013-07-09 HISTORY — DX: Acute myocardial infarction, unspecified: I21.9

## 2013-07-09 HISTORY — DX: Pneumonia, unspecified organism: J18.9

## 2013-07-09 HISTORY — DX: Type 2 diabetes mellitus without complications: E11.9

## 2013-07-09 HISTORY — DX: Angina pectoris, unspecified: I20.9

## 2013-07-09 HISTORY — DX: Atherosclerotic heart disease of native coronary artery without angina pectoris: I25.10

## 2013-07-09 HISTORY — PX: LOWER EXTREMITY ANGIOGRAM: SHX5508

## 2013-07-09 HISTORY — DX: Gout, unspecified: M10.9

## 2013-07-09 HISTORY — DX: Pure hypercholesterolemia, unspecified: E78.00

## 2013-07-09 HISTORY — PX: FEMORAL ARTERY STENT: SHX1583

## 2013-07-09 LAB — POCT I-STAT, CHEM 8
BUN: 6 mg/dL (ref 6–23)
Calcium, Ion: 1.08 mmol/L — ABNORMAL LOW (ref 1.13–1.30)
Chloride: 101 mEq/L (ref 96–112)
Creatinine, Ser: 0.9 mg/dL (ref 0.50–1.35)
HCT: 42 % (ref 39.0–52.0)
TCO2: 24 mmol/L (ref 0–100)

## 2013-07-09 LAB — POCT ACTIVATED CLOTTING TIME
Activated Clotting Time: 247 seconds
Activated Clotting Time: 170 seconds

## 2013-07-09 LAB — GLUCOSE, CAPILLARY: Glucose-Capillary: 142 mg/dL — ABNORMAL HIGH (ref 70–99)

## 2013-07-09 SURGERY — ANGIOGRAM, LOWER EXTREMITY
Anesthesia: LOCAL | Laterality: Right

## 2013-07-09 MED ORDER — HYDRALAZINE HCL 20 MG/ML IJ SOLN
INTRAMUSCULAR | Status: AC
  Filled 2013-07-09: qty 1

## 2013-07-09 MED ORDER — HYDRALAZINE HCL 20 MG/ML IJ SOLN: 10.0000 mg | INTRAMUSCULAR | Status: DC | PRN

## 2013-07-09 MED ORDER — MORPHINE SULFATE 2 MG/ML IJ SOLN
INTRAMUSCULAR | Status: AC
  Filled 2013-07-09: qty 1

## 2013-07-09 MED ORDER — ALBUTEROL SULFATE HFA 108 (90 BASE) MCG/ACT IN AERS
2.0000 | INHALATION_SPRAY | Freq: Four times a day (QID) | RESPIRATORY_TRACT | Status: DC | PRN
  Filled 2013-07-09: qty 6.7

## 2013-07-09 MED ORDER — GUAIFENESIN-DM 100-10 MG/5ML PO SYRP: 15.0000 mL | ORAL_SOLUTION | ORAL | Status: DC | PRN

## 2013-07-09 MED ORDER — LABETALOL HCL 5 MG/ML IV SOLN
INTRAVENOUS | Status: AC
  Filled 2013-07-09: qty 4

## 2013-07-09 MED ORDER — ALUM & MAG HYDROXIDE-SIMETH 200-200-20 MG/5ML PO SUSP: 15.0000 mL | ORAL | Status: DC | PRN

## 2013-07-09 MED ORDER — HEPARIN SODIUM (PORCINE) 1000 UNIT/ML IJ SOLN
INTRAMUSCULAR | Status: AC
  Filled 2013-07-09: qty 1

## 2013-07-09 MED ORDER — ASPIRIN EC 81 MG PO TBEC
81.0000 mg | DELAYED_RELEASE_TABLET | Freq: Every day | ORAL | Status: DC
  Administered 2013-07-09 – 2013-07-10 (×2): 81 mg via ORAL
  Filled 2013-07-09 (×2): qty 1

## 2013-07-09 MED ORDER — SODIUM CHLORIDE 0.9 % IV SOLN
1.0000 mL/kg/h | INTRAVENOUS | Status: DC
  Administered 2013-07-09: 1 mL/kg/h via INTRAVENOUS

## 2013-07-09 MED ORDER — OXYCODONE HCL 5 MG PO TABS
5.0000 mg | ORAL_TABLET | ORAL | Status: DC | PRN
  Administered 2013-07-09: 10 mg via ORAL

## 2013-07-09 MED ORDER — MIDAZOLAM HCL 2 MG/2ML IJ SOLN
INTRAMUSCULAR | Status: AC
  Filled 2013-07-09: qty 2

## 2013-07-09 MED ORDER — SODIUM CHLORIDE 0.9 % IV SOLN
INTRAVENOUS | Status: DC
  Administered 2013-07-09: 08:00:00 via INTRAVENOUS

## 2013-07-09 MED ORDER — GABAPENTIN 100 MG PO CAPS
200.0000 mg | ORAL_CAPSULE | Freq: Two times a day (BID) | ORAL | Status: DC
  Administered 2013-07-09 – 2013-07-10 (×2): 200 mg via ORAL
  Filled 2013-07-09 (×3): qty 2

## 2013-07-09 MED ORDER — OXYCODONE HCL 5 MG PO TABS
ORAL_TABLET | ORAL | Status: AC
  Filled 2013-07-09: qty 2

## 2013-07-09 MED ORDER — ONDANSETRON HCL 4 MG/2ML IJ SOLN: 4.0000 mg | Freq: Four times a day (QID) | INTRAMUSCULAR | Status: DC | PRN

## 2013-07-09 MED ORDER — ACETAMINOPHEN 650 MG RE SUPP: 325.0000 mg | RECTAL | Status: DC | PRN

## 2013-07-09 MED ORDER — MORPHINE SULFATE 2 MG/ML IJ SOLN: 2.0000 mg | INTRAMUSCULAR | Status: DC | PRN

## 2013-07-09 MED ORDER — TRIAZOLAM 0.25 MG PO TABS
0.2500 mg | ORAL_TABLET | Freq: Every day | ORAL | Status: DC
  Administered 2013-07-09: 0.25 mg via ORAL
  Filled 2013-07-09: qty 1

## 2013-07-09 MED ORDER — ZOLPIDEM TARTRATE 5 MG PO TABS
5.0000 mg | ORAL_TABLET | Freq: Every evening | ORAL | Status: DC | PRN
  Administered 2013-07-09: 5 mg via ORAL
  Filled 2013-07-09: qty 1

## 2013-07-09 MED ORDER — LABETALOL HCL 5 MG/ML IV SOLN
10.0000 mg | INTRAVENOUS | Status: DC | PRN
  Administered 2013-07-09: 10 mg via INTRAVENOUS

## 2013-07-09 MED ORDER — METOPROLOL TARTRATE 50 MG PO TABS
50.0000 mg | ORAL_TABLET | Freq: Two times a day (BID) | ORAL | Status: DC
  Administered 2013-07-09 – 2013-07-10 (×2): 50 mg via ORAL
  Filled 2013-07-09 (×3): qty 1

## 2013-07-09 MED ORDER — HEPARIN (PORCINE) IN NACL 2-0.9 UNIT/ML-% IJ SOLN
INTRAMUSCULAR | Status: AC
  Filled 2013-07-09: qty 500

## 2013-07-09 MED ORDER — FENTANYL CITRATE 0.05 MG/ML IJ SOLN
INTRAMUSCULAR | Status: AC
  Filled 2013-07-09: qty 2

## 2013-07-09 MED ORDER — METOPROLOL TARTRATE 1 MG/ML IV SOLN: 2.0000 mg | INTRAVENOUS | Status: DC | PRN

## 2013-07-09 MED ORDER — TAMSULOSIN HCL 0.4 MG PO CAPS
0.4000 mg | ORAL_CAPSULE | Freq: Two times a day (BID) | ORAL | Status: DC
  Administered 2013-07-09 – 2013-07-10 (×2): 0.4 mg via ORAL
  Filled 2013-07-09 (×3): qty 1

## 2013-07-09 MED ORDER — LIDOCAINE HCL (PF) 1 % IJ SOLN
INTRAMUSCULAR | Status: AC
  Filled 2013-07-09: qty 30

## 2013-07-09 MED ORDER — MORPHINE SULFATE 10 MG/ML IJ SOLN
2.0000 mg | INTRAMUSCULAR | Status: DC | PRN
  Administered 2013-07-09: 2 mg via INTRAVENOUS

## 2013-07-09 MED ORDER — CITALOPRAM HYDROBROMIDE 20 MG PO TABS
20.0000 mg | ORAL_TABLET | Freq: Every day | ORAL | Status: DC
  Administered 2013-07-09 – 2013-07-10 (×2): 20 mg via ORAL
  Filled 2013-07-09 (×2): qty 1

## 2013-07-09 MED ORDER — ACETAMINOPHEN 325 MG PO TABS: 325.0000 mg | ORAL_TABLET | ORAL | Status: DC | PRN

## 2013-07-09 MED ORDER — RAMIPRIL 10 MG PO CAPS
10.0000 mg | ORAL_CAPSULE | Freq: Every day | ORAL | Status: DC
  Administered 2013-07-09 – 2013-07-10 (×2): 10 mg via ORAL
  Filled 2013-07-09 (×2): qty 1

## 2013-07-09 MED ORDER — PHENOL 1.4 % MT LIQD
1.0000 | OROMUCOSAL | Status: DC | PRN
  Filled 2013-07-09: qty 177

## 2013-07-09 NOTE — Interval H&P Note (Signed)
History and Physical Interval Note:  07/09/2013 7:45 AM  Leslee Home  has presented today for surgery, with the diagnosis of claudication  The various methods of treatment have been discussed with the patient and family. After consideration of risks, benefits and other options for treatment, the patient has consented to  Procedure(s): LOWER EXTREMITY ANGIOGRAM (N/A) as a surgical intervention .  The patient's history has been reviewed, patient examined, no change in status, stable for surgery.  I have reviewed the patient's chart and labs.  Questions were answered to the patient's satisfaction.     Franki Alcaide IV, V. WELLS

## 2013-07-09 NOTE — Op Note (Signed)
[facility]  Patient name: [name] MRN: [id number] DOB: 1941-01-17 Sex: male  07/09/2013 Pre-operative Diagnosis: right leg pain Post-operative diagnosis:  Same Surgeon:  Jorge Ny Procedure Performed:  1.  ultrasound-guided access, left femoral artery  2.  abdominal aortogram  3.  right lower extremity runoff  4.  stent, right superficial femoral artery    Indications:  The patient continues to have right leg pain.  I was uncertain as to whether or not this is vascular in origin, however the after lengthy discussion because of the disability he has I elected to proceed with further evaluation the angiography and possible intervention  Procedure:  The patient was identified in the holding area and taken to room 8.  The patient was then placed supine on the table and prepped and draped in the usual sterile fashion.  A time out was called.  Ultrasound was used to evaluate the left common femoral artery.  It was patent .  A digital ultrasound image was acquired.  A micropuncture needle was used to access the left common femoral artery under ultrasound guidance.  An 018 wire was advanced without resistance and a micropuncture sheath was placed.  The 018 wire was removed and a benson wire was placed.  The micropuncture sheath was exchanged for a 5 french sheath.  An omniflush catheter was advanced over the wire to the level of L-1.  An abdominal angiogram was obtained.  Next, using the omniflush catheter and a benson wire, the aortic bifurcation was crossed and the catheter was placed into theright external iliac artery and right runoff was obtained.    Findings:   Aortogram:  No significant stenosis is identified within the suprarenal aorta.  The right renal artery is widely patent.  The infrarenal Dopplers have a calcified but no significant stenosis identified.  Bilateral common and external iliac arteries are widely patent.  Right Lower Extremity:   The right common femoral artery is calcified but patent as is the profunda femoral artery.  The right superficial femoral artery is patent throughout it's course however there are multiple areas of high-grade stenosis, greater than 80% within the proximal and mid superficial femoral artery.  The popliteal artery is diseased with no stenosis greater than 30%.  There is two-vessel runoff via the anterior tibial posterior tibial artery.  Left Lower Extremity:  Not evaluated  Intervention:  After the above images were acquired, the decision was made to proceed with intervention.  Over a 035 wire, a 7 x 45 Ansel 1 sheath was advanced into the right external iliac artery.  Using a quick cross catheter and a woolly wire, wire access was obtained into the popliteal artery.  I elected to primarily stented these lesions.  I initially used a 7 x 150 Cordis Smart stent.  I then placed a second 7 x 80 Cordis Smart stent.  The stents were molded with a 6 mm balloon.  Followup arteriogram revealed resolution of the stenosis within the superficial femoral artery.  Runoff was similar to 3 intervention.  At this point, the decision was made to terminate the procedure.  The sheath was withdrawn to the left external iliac artery.  The patient taken the holding area for sheath pull.  Impression:  #1  no significant aortoiliac stenosis identified  #2  high-grade lesions within the proximal and mid right superficial femoral artery successfully stented using a 7 mm x 1 50 and a 7 x 80 Cordis stent  Theotis Burrow, M.D. [facility] [place] Office: [phone] Pager:  [phone]

## 2013-07-09 NOTE — Telephone Encounter (Addendum)
Message copied by Fredrich Birks on Tue Jul 09, 2013  4:51 PM ------      Message from: Sharee Pimple      Created: Tue Jul 09, 2013 11:26 AM      Regarding: schedule                   ----- Message -----         From: Melene Plan, RN         Sent: 07/09/2013  10:43 AM           To: Sharee Pimple, CMA, [facility]                        ----- Message -----         From: Nada Libman, MD         Sent: 07/09/2013  10:36 AM           To: Reuel Derby, Melene Plan, RN            07/09/2013:            Surgeon:  Jorge Ny      Procedure Performed:       1.  ultrasound-guided access, left femoral artery       2.  abdominal aortogram       3.  right lower extremity runoff       4.  stent, right superficial femoral artery            Schedule followup in 6 weeks with an ultrasound and ABIs of the right leg ------  Spoke with pts wife, dpm

## 2013-07-09 NOTE — Progress Notes (Signed)
VASCULAR LAB PRELIMINARY  PRELIMINARY  PRELIMINARY  PRELIMINARY  Right lower extremity venous duplex completed.    Preliminary report:  Right:  No evidence of DVT, superficial thrombosis, or Baker's cyst.  Myrth Dahan, RVT 07/09/2013, 3:19 PM

## 2013-07-09 NOTE — H&P (View-Only) (Signed)
[facility] [place]   Patient name: [name] MRN: [id number] DOB: 07/24/1941 Sex: male     Chief Complaint  Patient presents with  . Re-evaluation    1 month f/u     HISTORY OF PRESENT ILLNESS: The patient comes back for followup evaluation of his right leg.  He is well known to me, having undergone left below, converted to above-knee amputation approximately 3 years ago.  He has been having significant pain in his right leg.  This is mostly at night and in his knee.  He describes his right foot as being very cold.  His symptoms are somewhat treated with narcotics.  He has trouble sleeping and eating secondary to the pain.  He also complains of swelling in his right leg.  I tried to get into see an orthopedist, however he did not do this.  I placed him in low-grade 15-20 mm compression stockings for his swelling which did have some impact.  He has fallen a lot since I last saw him because of the discomfort in his foot.  Past Medical History  Diagnosis Date  . Diabetes mellitus   . Asthma   . COPD (chronic obstructive pulmonary disease)   . Hypertension   . Renal disorder     Past Surgical History  Procedure Laterality Date  . Cardiac surgery    . Nephrectomy    . Cholecystectomy    . Amputation      History   Social History  . Marital Status: Married    Spouse Name: N/A    Number of Children: N/A  . Years of Education: N/A   Occupational History  . Not on file.   Social History Main Topics  . Smoking status: Current Every Day Smoker -- 1.00 packs/day    Types: Cigarettes  . Smokeless tobacco: Not on file     Comment: pt states that he is trying to quit  . Alcohol Use: 7.0 oz/week    14 drink(s) per week  . Drug Use: No  . Sexual Activity: Not on file   Other Topics Concern  . Not on file   Social History Narrative  . No narrative on file    Family History  Problem Relation Age of Onset  . Cancer Mother   . Heart attack Father      Allergies as of 07/01/2013 - Review Complete 07/01/2013  Allergen Reaction Noted  . Erythromycin    . Iohexol  07/15/2007  . Penicillins      Current Outpatient Prescriptions on File Prior to Visit  Medication Sig Dispense Refill  . albuterol (PROAIR HFA) 108 (90 BASE) MCG/ACT inhaler Inhale 2 puffs into the lungs every 6 (six) hours as needed.      . aspirin 81 MG tablet Take 162 mg by mouth every evening.       . citalopram (CELEXA) 20 MG tablet Take 20 mg by mouth daily.       . diazepam (VALIUM) 10 MG tablet Take 10 mg by mouth every 6 (six) hours as needed.      . gabapentin (NEURONTIN) 100 MG capsule Take 100 mg by mouth 2 (two) times daily.      . HYDROcodone-acetaminophen (NORCO) 7.5-325 MG per tablet Take 1 tablet by mouth every 4 (four) hours as needed. FOR PAIN      . metFORMIN (GLUCOPHAGE) 500 MG tablet Take 500 mg by mouth 2 (two) times daily with a meal.      .   metoprolol (LOPRESSOR) 50 MG tablet Take 50 mg by mouth 2 (two) times daily.      . oxyCODONE-acetaminophen (PERCOCET) 10-325 MG per tablet Take 1 tablet by mouth as needed.      . promethazine (PHENERGAN) 25 MG tablet Take 25 mg by mouth every 6 (six) hours as needed.      . ramipril (ALTACE) 1.25 MG capsule Take 1.25 mg by mouth every evening.       . Tamsulosin HCl (FLOMAX) 0.4 MG CAPS Take 0.4 mg by mouth 2 (two) times daily.       . Triazolam (HALCION PO) Take 1 tablet by mouth at bedtime.      . zolpidem (AMBIEN) 10 MG tablet Take 10-20 mg by mouth at bedtime as needed.       No current facility-administered medications on file prior to visit.     REVIEW OF SYSTEMS: Please see history of present illness, otherwise no changes from most recent visit.  PHYSICAL EXAMINATION:   Vital signs are BP 131/87  Pulse 91  Ht 6' 3" (1.905 m)  Wt 165 lb (74.844 kg)  BMI 20.62 kg/m2  SpO2 98% General: The patient appears their stated age. HEENT:  No gross abnormalities Pulmonary:  Non labored  breathing Abdomen: Soft and non-tender Musculoskeletal: Left above-knee amputation Neurologic: No focal weakness or paresthesias are detected, Skin: There are no ulcer or rashes noted. Psychiatric: The patient has normal affect. Cardiovascular:  Nonpalpable pedal pulses   Diagnostic Studies None  Assessment: Right leg pain Plan: The patient is having significant amount of pain in his right leg.  He has been told in the past that he is not a candidate for orthopedic procedures.  I do feel that he has vascular disease in his leg, his most recent ABI was 0.6.  However, I am not convinced that this is contributing to any of his problems.  I discussed this with the patient.  I told him that I would consider proceeding with angiography to evaluate his anatomy and potentially treat any stenosis that was encountered.  I was very realistic with him, explaining that this may have any impact on his right leg pain.  In addition we discussed the potential complications including embolization and need for emergent surgery.  The patient feels very strongly about proceeding, even though it may not have any impact on his pain.  His procedure is scheduled for Tuesday, November 4  V. Wells Dino Borntreger IV, M.D. [facility] [place] Office: [phone] Pager:  [phone]   

## 2013-07-10 DIAGNOSIS — R5381 Other malaise: Secondary | ICD-10-CM | POA: Diagnosis not present

## 2013-07-10 DIAGNOSIS — R404 Transient alteration of awareness: Secondary | ICD-10-CM | POA: Diagnosis not present

## 2013-07-10 LAB — GLUCOSE, CAPILLARY

## 2013-07-10 MED ORDER — CLOPIDOGREL BISULFATE 75 MG PO TABS: 75.0000 mg | ORAL_TABLET | Freq: Every day | ORAL | Status: DC

## 2013-07-10 MED ORDER — ASPIRIN 81 MG PO TBEC: 81.0000 mg | DELAYED_RELEASE_TABLET | Freq: Every day | ORAL | Status: DC

## 2013-07-10 MED ORDER — CLOPIDOGREL BISULFATE 75 MG PO TABS
75.0000 mg | ORAL_TABLET | Freq: Every day | ORAL | Status: DC
  Administered 2013-07-10: 75 mg via ORAL
  Filled 2013-07-10: qty 1

## 2013-07-10 NOTE — Care Management Note (Signed)
    Page 1 of 1   07/10/2013     2:11:50 PM   CARE MANAGEMENT NOTE 07/10/2013  Patient:  [name], [name]   Account Number:  [id number]  Date Initiated:  07/10/2013  Documentation initiated by:  GRAVES-BIGELOW,Sandie Swayze  Subjective/Objective Assessment:   Pt admitted for abdominal aortogram . Plan for d/c home with hh services.     Action/Plan:   CM did speak to pt and wife in regards to [facility] services and they chose [facility] for services. CM did make referral and SOC to begin within 24-48 hours post d/c.   Anticipated DC Date:  07/10/2013   Anticipated DC Plan:  HOME W HOME HEALTH SERVICES      DC Planning Services  CM consult      [facility]  HOME HEALTH   Choice offered to / List presented to:  C-1 Patient        HH arranged  HH-1 RN  HH-10 DISEASE MANAGEMENT  HH-2 PT  HH-3 OT  HH-4 NURSE'S AIDE      HH agency  [facility]   Status of service:  Completed, signed off Medicare Important Message given?   (If response is "NO", the following Medicare IM given date fields will be blank) Date Medicare IM given:   Date Additional Medicare IM given:    Discharge Disposition:  HOME W HOME HEALTH SERVICES  Per UR Regulation:  Reviewed for med. necessity/level of care/duration of stay  If discussed at Long Length of Stay Meetings, dates discussed:    Comments:

## 2013-07-10 NOTE — Progress Notes (Signed)
Nutrition Brief Note  Patient identified on the Malnutrition Screening Tool (MST) Report for recent 60 lb weight loss. Patient reports "a lot" of weight loss over the past year. Per review of usual weights below, he has lost an insignificant amount of weight (13% of usual weight) in a time frame of one year. His weight has been stable since September. Intake is adequate.  Wt Readings from Last 15 Encounters:  07/09/13 165 lb (74.844 kg)  07/09/13 165 lb (74.844 kg)  07/01/13 165 lb (74.844 kg)  05/27/13 165 lb (74.844 kg)  05/02/12 189 lb (85.73 kg)  02/03/12 200 lb (90.719 kg)   Ht Readings from Last 3 Encounters:  07/09/13 6\' 3"  (1.905 m)  07/09/13 6\' 3"  (1.905 m)  07/01/13 6\' 3"  (1.905 m)    BMI=21.9 using equation for amputations. Patient meets criteria for normal weight based on current BMI.   Current diet order is CHO-modified medium, patient is consuming approximately 50-100% of meals at this time. Labs and medications reviewed.   No nutrition interventions warranted at this time. If nutrition issues arise, please consult RD.   [name], RD, LDN, CNSC Pager [phone] After Hours Pager [phone]

## 2013-07-10 NOTE — Progress Notes (Signed)
Clinical Social Work Department BRIEF PSYCHOSOCIAL ASSESSMENT 07/10/2013  Patient:  [name], Chambers     Account Number:  [id number]     Admit date:  07/09/2013  Clinical Social Worker:  Harless Nakayama  Date/Time:  07/10/2013 01:00 PM  Referred by:  Physician  Date Referred:  07/10/2013 Referred for  Substance Abuse   Other Referral:   Interview type:  Patient Other interview type:   Pt wife at bedside    PSYCHOSOCIAL DATA Living Status:  WIFE Admitted from facility:   Level of care:   Primary support name:  Hyun Marsalis [phone] Primary support relationship to patient:  SPOUSE Degree of support available:   Pt has supportive family    CURRENT CONCERNS Current Concerns  Substance Abuse   Other Concerns:    SOCIAL WORK ASSESSMENT / PLAN CSW received consult to speak with pt about alcohol abuse. CSW visited pt in room and wife was at bedside. CSW asked if pt would like his wife to remain in the room or if pt wife would like to step out. Pt was okay with having wife stay in room during the assessment. CSW explained that consult was put in regarding pt alcohol use. Pt was agreeable to answering CSW questions. CSW compeleted SBIRT with pt. Pt has no concerns about his drinking and says he drinks in the company of friends. Pt informed CSW him and his wife have custody of their 33 year old granddaughter and he would never do anything "foolish" with his granddaugther staying in their home. Pt reports having no feelings of guilt after drinking and does not wish to stop drinking at this time. Pt says he has not heard any concerns from doctors at this time about his drinking. Pt mentioned that his wife did have a concern once in the past. Pt wife did not wish to add anything at time of assessment. Pt was agreeable to taking substance abuse resource list incase it may be something of need in the future. CSW also explained how pt could find list of AA meetings if he felt he wanted to  stop drinking and needed assistance. At this time no further CSW needs. CSW signing off. Pt nurse informedthat assessment was completed.   Assessment/plan status:  No Further Intervention Required Other assessment/ plan:   Information/referral to community resources:   Substance Abuse Treatment List    PATIENT'S/FAMILY'S RESPONSE TO PLAN OF CARE: Pt was friendly with CSW and accepting of resource list. However, pt does not feel he has a need for resources at this time.       Keirsten Matuska, LCSWA [phone]

## 2013-07-10 NOTE — Progress Notes (Signed)
  VASCULAR SURGERY  PROGRESS NOTE   1 Day Post-Op abdominal aortogram  3. right lower extremity runoff  4. stent, right superficial femoral artery   SUBJECTIVE: pt doing well. States he has more feeling in the right toes, they are warmer and he can now move them. Anxious to go home. Also states he has been falling a lot.  PHYSICAL EXAM: BP Readings from Last 3 Encounters:  07/10/13 145/82  07/10/13 145/82  07/01/13 131/87   Temp Readings from Last 3 Encounters:  07/10/13 97.8 F (36.6 C) Oral  07/10/13 97.8 F (36.6 C) Oral  05/27/13 97.8 F (36.6 C) Oral   Pulse Readings from Last 3 Encounters:  07/10/13 80  07/10/13 80  07/01/13 91   SpO2 Readings from Last 3 Encounters:  07/10/13 96%  07/10/13 96%  07/01/13 98%     Intake/Output Summary (Last 24 hours) at 07/10/13 0918 Last data filed at 07/10/13 1610  Gross per 24 hour  Intake    360 ml  Output   1075 ml  Net   -715 ml    Extremities: Incisions clean, dry and intact without hematoma in left groin Pulse status palp DP/PT on right Right foot warm  LABS: Lab Results  Component Value Date   WBC 5.5 09/03/2010   HGB 14.3 07/09/2013   HCT 42.0 07/09/2013   MCV 96.3 09/03/2010   PLT 161 DELTA CHECK NOTED 09/03/2010   Lab Results  Component Value Date   CREATININE 0.90 07/09/2013   Lab Results  Component Value Date   INR 1.00 09/02/2010       ASSESSMENT: 1 Day Post-Op s/p Angio and right SFA stent PLAN:  OOB  Begin Plavix  Consult to Soc. Work for ETOH abuse

## 2013-07-11 ENCOUNTER — Telehealth: Payer: Self-pay | Admitting: *Deleted

## 2013-07-11 DIAGNOSIS — R69 Illness, unspecified: Secondary | ICD-10-CM | POA: Diagnosis not present

## 2013-07-11 NOTE — Telephone Encounter (Signed)
[name] called to request a "cream" for his groin rash. This has already been evaluated by his PCP, Dr. Regino Schultze, for the past 3-4 months. The patient reports no problems with incision site ( S/P Right femoral artery stenting by Dr. Myra Gianotti on 07-09-13).  He does report some slight soreness in the area but he is afebrile and has no drainage from his cath site. I talked to [name] and she said that the patient did not complain of any rash yesterday at discharge or request any creams.  I explained to the patient that he should contact Dr. Edison Simon office and see what other options he would suggest to treat his rash, since they have evidently given him samples and several Rx in the past for this. He will call them today.

## 2013-07-11 NOTE — Progress Notes (Signed)
[facility] Discharge Summary   Patient ID:  [name] MRN: [id number] DOB/AGE: May 02, 1941 72 y.o.  Admit date: 07/09/2013 Discharge date: 07/11/2013 Date of Surgery: 07/09/2013 Surgeon: Surgeon(s): Nada Libman, MD  Admission Diagnosis: claudication  Discharge Diagnoses:  claudication  Secondary Diagnoses: Past Medical History  Diagnosis Date  . Asthma   . Hypertension   . Ureter cancer 12/2001    "shut down my kidney & resulted in nephrectomy" (07/09/2013)  . Coronary artery disease   . High cholesterol   . Heart murmur     "had rheumatic fever as a kid" (07/09/2013)  . Rheumatic fever   . Myocardial infarction 03/2002; ~ 2011  . Anginal pain   . Pneumonia     "once" (07/09/2013)  . Shortness of breath     "can happen at anytime" (07/09/2013)  . COPD (chronic obstructive pulmonary disease)     pt denies this hx on 07/09/2013  . Type II diabetes mellitus   . H/O hiatal hernia 1997  . Arthritis     "qwhere" (07/09/2013)  . Gout   . Renal disorder     "only have 1 kidney; due to go back soon to check the other one" (07/09/2013)    Procedure(s): 1. ultrasound-guided access, left femoral artery  2. abdominal aortogram  3. right lower extremity runoff  4. stent, right superficial femoral artery  Discharged Condition: good  HPI:  [name] is a 72 y.o. male who hasundergone left below, converted to above-knee amputation approximately 3 years ago. He has been having significant pain in his right leg. This is mostly at night and in his knee. He describes his right foot as being very cold. His symptoms are somewhat treated with narcotics. He has trouble sleeping and eating secondary to the pain. He also complains of swelling in his right leg. I tried to get into see an orthopedist, however he did not do this. I placed him in low-grade 15-20 mm compression stockings for his swelling which did have some impact. He has fallen a lot because of the  discomfort in his foot.pt was admitted for angiography to evaluate his anatomy and potentially treat any stenosis that was encountered     Hospital Course:  [name] is a 72 y.o. male is S/P 1. ultrasound-guided access, left femoral artery  2. abdominal aortogram  3. right lower extremity runoff  4. stent, right superficial femoral artery Pt states the numbness and pain in the right foot is better Physical exam: left groin soft  Post-op wounds healing well Extremities: Incisions clean, dry and intact without hematoma in left groin  Pulse status - palp DP/PT on right  Right foot warm Pt. transferring to chair, voiding and taking PO diet without difficulty. Pt pain controlled with PO pain meds. Labs as below Complications:none  Consults: Social Work for ETOH counseling information HH for PT/OT, safety check    Significant Diagnostic Studies: CBC Lab Results  Component Value Date   WBC 5.5 09/03/2010   HGB 14.3 07/09/2013   HCT 42.0 07/09/2013   MCV 96.3 09/03/2010   PLT 161 DELTA CHECK NOTED 09/03/2010    BMET    Component Value Date/Time   NA 137 07/09/2013 0822   K 4.2 07/09/2013 0822   CL 101 07/09/2013 0822   CO2 28 09/03/2010 0502   GLUCOSE 167* 07/09/2013 0822   BUN 6 07/09/2013 0822   CREATININE 0.90 07/09/2013 0822   CALCIUM 9.3 09/03/2010 0502  GFRNONAA >60 09/03/2010 0502   GFRAA  Value: >60        The eGFR has been calculated using the MDRD equation. This calculation has not been validated in all clinical situations. eGFR's persistently <60 mL/min signify possible Chronic Kidney Disease. 09/03/2010 0502   COAG Lab Results  Component Value Date   INR 1.00 09/02/2010   INR 1.10 02/08/2010   INR 1.05 02/05/2010     Disposition:  Discharge to :Home Discharge Orders   Future Appointments Provider Department Dept Phone   08/19/2013 11:00 AM [facility] [place] [facility] [address] [phone]   08/19/2013 11:30 AM [facility] [place]  [facility] [address] [phone]   08/19/2013 12:00 PM Nada Libman, MD [facility] [phone]   Future Orders Complete By Expires   Call MD for:  redness, tenderness, or signs of infection (pain, swelling, bleeding, redness, odor or green/yellow discharge around incision site)  As directed    Call MD for:  severe or increased pain, loss or decreased feeling  in affected limb(s)  As directed    Call MD for:  temperature >100.5  As directed    Driving Restrictions  As directed    Comments:     No driving   Increase activity slowly  As directed    Comments:     Walk with assistance use walker or cane as needed   May shower   As directed    Scheduling Instructions:     Thursday   Remove dressing in 24 hours  As directed    Resume previous diet  As directed        Medication List    STOP taking these medications       predniSONE 50 MG tablet  Commonly known as:  DELTASONE      TAKE these medications       aspirin 81 MG EC tablet  Take 1 tablet (81 mg total) by mouth daily.     citalopram 20 MG tablet  Commonly known as:  CELEXA  Take 20 mg by mouth daily.     clopidogrel 75 MG tablet  Commonly known as:  PLAVIX  Take 1 tablet (75 mg total) by mouth daily with breakfast.     diazepam 10 MG tablet  Commonly known as:  VALIUM  Take 10 mg by mouth every 6 (six) hours as needed for anxiety.     diphenhydrAMINE 50 MG capsule  Commonly known as:  BENADRYL  Take one tablet at 7:30 AM 07/09/13 (take with last dose of Prednisone 50 mg) prior to procedure.     gabapentin 100 MG capsule  Commonly known as:  NEURONTIN  Take 200 mg by mouth 2 (two) times daily.     HYDROcodone-acetaminophen 7.5-325 MG per tablet  Commonly known as:  NORCO  Take 1 tablet by mouth every 4 (four) hours as needed for pain.     HYDROmorphone 2 MG tablet  Commonly known as:  DILAUDID  Take 4-6 mg by mouth every 4 (four) hours as needed for pain.      metFORMIN 500 MG tablet  Commonly known as:  GLUCOPHAGE  Take 500 mg by mouth 2 (two) times daily with a meal.     metoprolol 50 MG tablet  Commonly known as:  LOPRESSOR  Take 50 mg by mouth 2 (two) times daily.     nitroGLYCERIN 0.4 MG/SPRAY spray  Commonly known as:  NITROLINGUAL  Place 1  spray under the tongue every 5 (five) minutes as needed for chest pain.     PROAIR HFA 108 (90 BASE) MCG/ACT inhaler  Generic drug:  albuterol  Inhale 2 puffs into the lungs every 6 (six) hours as needed for wheezing or shortness of breath.     ramipril 10 MG capsule  Commonly known as:  ALTACE  Take 10 mg by mouth every morning.     tamsulosin 0.4 MG Caps capsule  Commonly known as:  FLOMAX  Take 0.4 mg by mouth 2 (two) times daily.     triazolam 0.25 MG tablet  Commonly known as:  HALCION  Take 0.5 mg by mouth at bedtime.     zolpidem 10 MG tablet  Commonly known as:  AMBIEN  Take 10-40 mg by mouth at bedtime.       Verbal and written Discharge instructions given to the patient. Wound care per Discharge AVS     Follow-up Information   Follow up with Jorge Ny, MD On 07/22/2013. (pt has appt.)    Specialty:  Vascular Surgery   Contact information:   [address] [place] [address] [phone]       Signed: Marlowe Shores 07/11/2013, 10:21 AM

## 2013-07-14 DIAGNOSIS — I10 Essential (primary) hypertension: Secondary | ICD-10-CM | POA: Diagnosis not present

## 2013-07-14 DIAGNOSIS — E119 Type 2 diabetes mellitus without complications: Secondary | ICD-10-CM | POA: Diagnosis not present

## 2013-07-14 DIAGNOSIS — I2510 Atherosclerotic heart disease of native coronary artery without angina pectoris: Secondary | ICD-10-CM | POA: Diagnosis not present

## 2013-07-14 DIAGNOSIS — J449 Chronic obstructive pulmonary disease, unspecified: Secondary | ICD-10-CM | POA: Diagnosis not present

## 2013-07-14 DIAGNOSIS — Z48812 Encounter for surgical aftercare following surgery on the circulatory system: Secondary | ICD-10-CM | POA: Diagnosis not present

## 2013-07-14 DIAGNOSIS — J45909 Unspecified asthma, uncomplicated: Secondary | ICD-10-CM | POA: Diagnosis not present

## 2013-07-16 DIAGNOSIS — J449 Chronic obstructive pulmonary disease, unspecified: Secondary | ICD-10-CM | POA: Diagnosis not present

## 2013-07-16 DIAGNOSIS — I10 Essential (primary) hypertension: Secondary | ICD-10-CM | POA: Diagnosis not present

## 2013-07-16 DIAGNOSIS — Z48812 Encounter for surgical aftercare following surgery on the circulatory system: Secondary | ICD-10-CM | POA: Diagnosis not present

## 2013-07-16 DIAGNOSIS — J45909 Unspecified asthma, uncomplicated: Secondary | ICD-10-CM | POA: Diagnosis not present

## 2013-07-16 DIAGNOSIS — I2510 Atherosclerotic heart disease of native coronary artery without angina pectoris: Secondary | ICD-10-CM | POA: Diagnosis not present

## 2013-07-16 DIAGNOSIS — E119 Type 2 diabetes mellitus without complications: Secondary | ICD-10-CM | POA: Diagnosis not present

## 2013-07-17 ENCOUNTER — Other Ambulatory Visit: Payer: Self-pay | Admitting: Surgery

## 2013-07-17 DIAGNOSIS — I739 Peripheral vascular disease, unspecified: Secondary | ICD-10-CM

## 2013-07-17 DIAGNOSIS — Z48812 Encounter for surgical aftercare following surgery on the circulatory system: Secondary | ICD-10-CM

## 2013-07-18 ENCOUNTER — Telehealth: Payer: Self-pay | Admitting: *Deleted

## 2013-07-18 DIAGNOSIS — J449 Chronic obstructive pulmonary disease, unspecified: Secondary | ICD-10-CM | POA: Diagnosis not present

## 2013-07-18 DIAGNOSIS — E119 Type 2 diabetes mellitus without complications: Secondary | ICD-10-CM | POA: Diagnosis not present

## 2013-07-18 DIAGNOSIS — J45909 Unspecified asthma, uncomplicated: Secondary | ICD-10-CM | POA: Diagnosis not present

## 2013-07-18 DIAGNOSIS — Z48812 Encounter for surgical aftercare following surgery on the circulatory system: Secondary | ICD-10-CM | POA: Diagnosis not present

## 2013-07-18 DIAGNOSIS — I10 Essential (primary) hypertension: Secondary | ICD-10-CM | POA: Diagnosis not present

## 2013-07-18 DIAGNOSIS — I2510 Atherosclerotic heart disease of native coronary artery without angina pectoris: Secondary | ICD-10-CM | POA: Diagnosis not present

## 2013-07-18 NOTE — Telephone Encounter (Signed)
Patient called to report that he still has rash in his groin. He still has not contacted his PCP or been referred to a Dermatologist. I offered to ask Dr. Myra Gianotti on Monday about a dermatology referral but [name] got angry and said never mind. I explained to him that sometimes the lesion needed to be examined under the microscope to determine if it was fungal, bacterial, etc and that we did not have this equipment in our office but a dermatology would. Patient was still not interested and he still can not remember all the creams his PCP has tried in the past. I again told him that I did not want to waste his time and resources by prescribing something that has not worked in the past. He said he would contact his PCP.

## 2013-07-19 DIAGNOSIS — I10 Essential (primary) hypertension: Secondary | ICD-10-CM | POA: Diagnosis not present

## 2013-07-19 DIAGNOSIS — J449 Chronic obstructive pulmonary disease, unspecified: Secondary | ICD-10-CM | POA: Diagnosis not present

## 2013-07-19 DIAGNOSIS — E119 Type 2 diabetes mellitus without complications: Secondary | ICD-10-CM | POA: Diagnosis not present

## 2013-07-19 DIAGNOSIS — I2510 Atherosclerotic heart disease of native coronary artery without angina pectoris: Secondary | ICD-10-CM | POA: Diagnosis not present

## 2013-07-19 DIAGNOSIS — J45909 Unspecified asthma, uncomplicated: Secondary | ICD-10-CM | POA: Diagnosis not present

## 2013-07-19 DIAGNOSIS — Z48812 Encounter for surgical aftercare following surgery on the circulatory system: Secondary | ICD-10-CM | POA: Diagnosis not present

## 2013-07-23 DIAGNOSIS — I10 Essential (primary) hypertension: Secondary | ICD-10-CM | POA: Diagnosis not present

## 2013-07-23 DIAGNOSIS — Z48812 Encounter for surgical aftercare following surgery on the circulatory system: Secondary | ICD-10-CM | POA: Diagnosis not present

## 2013-07-23 DIAGNOSIS — J45909 Unspecified asthma, uncomplicated: Secondary | ICD-10-CM | POA: Diagnosis not present

## 2013-07-23 DIAGNOSIS — J449 Chronic obstructive pulmonary disease, unspecified: Secondary | ICD-10-CM | POA: Diagnosis not present

## 2013-07-23 DIAGNOSIS — I2510 Atherosclerotic heart disease of native coronary artery without angina pectoris: Secondary | ICD-10-CM | POA: Diagnosis not present

## 2013-07-23 DIAGNOSIS — E119 Type 2 diabetes mellitus without complications: Secondary | ICD-10-CM | POA: Diagnosis not present

## 2013-07-25 DIAGNOSIS — E119 Type 2 diabetes mellitus without complications: Secondary | ICD-10-CM | POA: Diagnosis not present

## 2013-07-25 DIAGNOSIS — I10 Essential (primary) hypertension: Secondary | ICD-10-CM | POA: Diagnosis not present

## 2013-07-25 DIAGNOSIS — I2510 Atherosclerotic heart disease of native coronary artery without angina pectoris: Secondary | ICD-10-CM | POA: Diagnosis not present

## 2013-07-25 DIAGNOSIS — J449 Chronic obstructive pulmonary disease, unspecified: Secondary | ICD-10-CM | POA: Diagnosis not present

## 2013-07-25 DIAGNOSIS — J45909 Unspecified asthma, uncomplicated: Secondary | ICD-10-CM | POA: Diagnosis not present

## 2013-07-25 DIAGNOSIS — Z48812 Encounter for surgical aftercare following surgery on the circulatory system: Secondary | ICD-10-CM | POA: Diagnosis not present

## 2013-07-26 ENCOUNTER — Telehealth: Payer: Self-pay

## 2013-07-26 NOTE — Telephone Encounter (Signed)
rec'd phone call from [facility], RN to report pt's complaints and situation at home.  Stated pt. Is c/o severe pain in the right lower leg, and pain in left AKA stump.  Noted pt. had a "slightly reddened and swollen" right lower extremity and "a firm area on back of right calf" on 07/25/13.  Pt. told the [facility] RN that the swelling in the right leg was "normal" for him. Due to the increased c/o pain in right lower leg and the finding of a firm area on right calf, the [facility] RN advised pt. to go to the ER @ [facility] for evaluation, but pt. refused to go.  HH RN reported that the wife voiced concern about pt. drinking a lot, in addition to taking pain medication and muscle relaxants of hers.  Stated that the pt. Took 2 Vicodin, and 2 Flexeril of his wife's prescriptions.   The [facility] RN requested to make Dr. Myra Gianotti aware of the amount of pain patient is complaining of, and that he is taking Neurontin 100 mg TID.  HH RN is asking for consideration to either increase the Neurontin, or prescribe pain medication.  Advised HH RN will make Dr. Myra Gianotti aware of situation and will return call with further recommendations.  Verb. Understanding.

## 2013-07-29 DIAGNOSIS — J45909 Unspecified asthma, uncomplicated: Secondary | ICD-10-CM | POA: Diagnosis not present

## 2013-07-29 DIAGNOSIS — I2510 Atherosclerotic heart disease of native coronary artery without angina pectoris: Secondary | ICD-10-CM | POA: Diagnosis not present

## 2013-07-29 DIAGNOSIS — E119 Type 2 diabetes mellitus without complications: Secondary | ICD-10-CM | POA: Diagnosis not present

## 2013-07-29 DIAGNOSIS — Z48812 Encounter for surgical aftercare following surgery on the circulatory system: Secondary | ICD-10-CM | POA: Diagnosis not present

## 2013-07-29 DIAGNOSIS — J449 Chronic obstructive pulmonary disease, unspecified: Secondary | ICD-10-CM | POA: Diagnosis not present

## 2013-07-29 DIAGNOSIS — I10 Essential (primary) hypertension: Secondary | ICD-10-CM | POA: Diagnosis not present

## 2013-07-30 DIAGNOSIS — Z48812 Encounter for surgical aftercare following surgery on the circulatory system: Secondary | ICD-10-CM | POA: Diagnosis not present

## 2013-07-30 DIAGNOSIS — J449 Chronic obstructive pulmonary disease, unspecified: Secondary | ICD-10-CM | POA: Diagnosis not present

## 2013-07-30 DIAGNOSIS — J45909 Unspecified asthma, uncomplicated: Secondary | ICD-10-CM | POA: Diagnosis not present

## 2013-07-30 DIAGNOSIS — E119 Type 2 diabetes mellitus without complications: Secondary | ICD-10-CM | POA: Diagnosis not present

## 2013-07-30 DIAGNOSIS — I2510 Atherosclerotic heart disease of native coronary artery without angina pectoris: Secondary | ICD-10-CM | POA: Diagnosis not present

## 2013-07-30 DIAGNOSIS — I10 Essential (primary) hypertension: Secondary | ICD-10-CM | POA: Diagnosis not present

## 2013-07-31 ENCOUNTER — Telehealth: Payer: Self-pay

## 2013-07-31 DIAGNOSIS — E119 Type 2 diabetes mellitus without complications: Secondary | ICD-10-CM | POA: Diagnosis not present

## 2013-07-31 DIAGNOSIS — I2510 Atherosclerotic heart disease of native coronary artery without angina pectoris: Secondary | ICD-10-CM | POA: Diagnosis not present

## 2013-07-31 DIAGNOSIS — J449 Chronic obstructive pulmonary disease, unspecified: Secondary | ICD-10-CM | POA: Diagnosis not present

## 2013-07-31 DIAGNOSIS — Z48812 Encounter for surgical aftercare following surgery on the circulatory system: Secondary | ICD-10-CM | POA: Diagnosis not present

## 2013-07-31 DIAGNOSIS — I10 Essential (primary) hypertension: Secondary | ICD-10-CM | POA: Diagnosis not present

## 2013-07-31 DIAGNOSIS — J45909 Unspecified asthma, uncomplicated: Secondary | ICD-10-CM | POA: Diagnosis not present

## 2013-07-31 NOTE — Telephone Encounter (Signed)
Pt's son called on his behalf.  Reports a new "raised area of approx. silver dollar size" on right lower leg.  Denies any pain, break in skin, or redness in site.  States the right foot is swollen, up to the ankle. Son states he is in town from [place], and is trying to make sure his father gets the care he needs.  States the area on the right calf that was reported on 11/21, has gone away.  Pt. has appt. on 12/15 with Dr. Myra Gianotti for 6 wk. F/u.  Son requesting to have right leg checked sooner than 12/15 follow up.  Advised there is no MD in office today, and office closed 11/27 and 11/28.  Advised if symptoms get worse over the holiday weekend, pt. should go to the ER.  Appt. given for 08/05/13 @ 1:15 PM.  Son agrees w/ plan.

## 2013-08-05 ENCOUNTER — Ambulatory Visit (INDEPENDENT_AMBULATORY_CARE_PROVIDER_SITE_OTHER): Payer: Medicare Other | Admitting: Surgery

## 2013-08-05 ENCOUNTER — Encounter: Payer: Self-pay | Admitting: Surgery

## 2013-08-05 VITALS — BP 110/65 | HR 112 | Resp 18 | Ht 75.0 in | Wt 165.0 lb

## 2013-08-05 DIAGNOSIS — Z48812 Encounter for surgical aftercare following surgery on the circulatory system: Secondary | ICD-10-CM | POA: Diagnosis not present

## 2013-08-05 DIAGNOSIS — M79609 Pain in unspecified limb: Secondary | ICD-10-CM

## 2013-08-05 DIAGNOSIS — I739 Peripheral vascular disease, unspecified: Secondary | ICD-10-CM | POA: Diagnosis not present

## 2013-08-05 NOTE — Telephone Encounter (Signed)
  Pt sched to see me on 12/1

## 2013-08-05 NOTE — Addendum Note (Signed)
Addended by: Adria Dill L on: 08/05/2013 04:46 PM   Modules accepted: Orders

## 2013-08-05 NOTE — Progress Notes (Signed)
Patient name: [name] MRN: [id number] DOB: June 09, 1941 Sex: male     Chief Complaint  Patient presents with  . New Evaluation    Right lower leg, several area's raised and sore, duration 2-3 mo    HISTORY OF PRESENT ILLNESS: The patient is back today for followup of his right leg pain.  He recently underwent stent to his right superficial femoral artery for leg pain.  He is back today with continued complaints of pain in his knee.  He has a chronic contracture in his right knee.  He is unable to walk using his left leg prosthesis.  He also complains of swelling in his right foot as well as it being very cold.  The patient coming to he knows that he is drinking too much and wants to get help.  Past Medical History  Diagnosis Date  . Asthma   . Hypertension   . Ureter cancer 12/2001    "shut down my kidney & resulted in nephrectomy" (07/09/2013)  . Coronary artery disease   . High cholesterol   . Heart murmur     "had rheumatic fever as a kid" (07/09/2013)  . Rheumatic fever   . Myocardial infarction 03/2002; ~ 2011  . Anginal pain   . Pneumonia     "once" (07/09/2013)  . Shortness of breath     "can happen at anytime" (07/09/2013)  . COPD (chronic obstructive pulmonary disease)     pt denies this hx on 07/09/2013  . Type II diabetes mellitus   . H/O hiatal hernia 1997  . Arthritis     "qwhere" (07/09/2013)  . Gout   . Renal disorder     "only have 1 kidney; due to go back soon to check the other one" (07/09/2013)    Past Surgical History  Procedure Laterality Date  . Nephrectomy Left 12/19/2001  . Femoral artery stent Right 07/09/2013  . Cholecystectomy  1980's  . Coronary artery bypass graft  ~ 2010    "CABG X5" (07/09/2013)  . Glaucoma surgery Bilateral ?[id number]  . Eye surgery    . Carpal tunnel release Bilateral ~2004; ~ 2011  . Trigger finger release Left ~ 1990's  . Cardiac catheterization  ~ 2010    "before the bypass" (07/09/2013)  . Below knee leg  amputation Left 08/2009  . Above knee leg amputation Left 09/2009  . Lower extremity angiogram Right Nov. 4, 2014    History   Social History  . Marital Status: Married    Spouse Name: N/A    Number of Children: N/A  . Years of Education: N/A   Occupational History  . Not on file.   Social History Main Topics  . Smoking status: Current Every Day Smoker -- 1.00 packs/day for 54 years    Types: Cigarettes  . Smokeless tobacco: Never Used  . Alcohol Use: 0.0 oz/week     Comment: 07/09/2013 "couple mixed drinks/week at most"  . Drug Use: No  . Sexual Activity: No   Other Topics Concern  . Not on file   Social History Narrative  . No narrative on file    Family History  Problem Relation Age of Onset  . Cancer Mother   . Heart attack Father     Allergies as of 08/05/2013 - Review Complete 08/05/2013  Allergen Reaction Noted  . Iohexol  07/15/2007  . Penicillins Itching and Swelling   . Erythromycin Swelling and Rash     Current Outpatient  Prescriptions on File Prior to Visit  Medication Sig Dispense Refill  . albuterol (PROAIR HFA) 108 (90 BASE) MCG/ACT inhaler Inhale 2 puffs into the lungs every 6 (six) hours as needed for wheezing or shortness of breath.       Marland Kitchen aspirin EC 81 MG EC tablet Take 1 tablet (81 mg total) by mouth daily.      . citalopram (CELEXA) 20 MG tablet Take 20 mg by mouth daily.       . clopidogrel (PLAVIX) 75 MG tablet Take 1 tablet (75 mg total) by mouth daily with breakfast.  30 tablet  1  . diazepam (VALIUM) 10 MG tablet Take 10 mg by mouth every 6 (six) hours as needed for anxiety.       . diphenhydrAMINE (BENADRYL) 50 MG capsule Take one tablet at 7:30 AM 07/09/13 (take with last dose of Prednisone 50 mg) prior to procedure.  1 capsule  0  . gabapentin (NEURONTIN) 100 MG capsule Take 200 mg by mouth 2 (two) times daily.       Marland Kitchen HYDROcodone-acetaminophen (NORCO) 7.5-325 MG per tablet Take 1 tablet by mouth every 4 (four) hours as needed for pain.        Marland Kitchen HYDROmorphone (DILAUDID) 2 MG tablet Take 4-6 mg by mouth every 4 (four) hours as needed for pain.      . metFORMIN (GLUCOPHAGE) 500 MG tablet Take 500 mg by mouth 2 (two) times daily with a meal.      . metoprolol (LOPRESSOR) 50 MG tablet Take 50 mg by mouth 2 (two) times daily.      . nitroGLYCERIN (NITROLINGUAL) 0.4 MG/SPRAY spray Place 1 spray under the tongue every 5 (five) minutes as needed for chest pain.      . ramipril (ALTACE) 10 MG capsule Take 10 mg by mouth every morning.      . Tamsulosin HCl (FLOMAX) 0.4 MG CAPS Take 0.4 mg by mouth 2 (two) times daily.       . triazolam (HALCION) 0.25 MG tablet Take 0.5 mg by mouth at bedtime.      Marland Kitchen zolpidem (AMBIEN) 10 MG tablet Take 10-40 mg by mouth at bedtime.        No current facility-administered medications on file prior to visit.     REVIEW OF SYSTEMS: Please see history of present illness, otherwise all systems are unchanged from prior visit  PHYSICAL EXAMINATION:   Vital signs are BP 110/65  Pulse 112  Resp 18  Ht 6\' 3"  (1.905 m)  Wt 165 lb (74.844 kg)  BMI 20.62 kg/m2  SpO2 99% General: The patient appears their stated age. HEENT:  No gross abnormalities Pulmonary:  Non labored breathing Musculoskeletal: Left leg is surgically absent Neurologic: No focal weakness or paresthesias are detected, Skin: There are no ulcer or rashes noted. Psychiatric: The patient has normal affect. Cardiovascular: There is a regular rate and rhythm.  Excellent dorsalis pedis and posterior tibial Doppler signals on the right   Diagnostic Studies None  Assessment: Status post right superficial femoral artery stenting Plan: The patient still continues to have leg pain.  Most of his pain is coming from the knee.  I have encouraged him to followup again with orthopedics for evaluation of this, as I had nothing to offer him for his knee pain.  I did recommend placing him in a compression stocking for his edema on the right leg.  I  encouraged him to seek help from alcoholic's anonymous.  We will  help facilitate back today.  He is going to followup with me in 3 months with a duplex of his right leg to evaluate the stent patency.  Jorge Ny, M.D. [facility] [place] [facility] Office: [phone] Pager:  [phone]

## 2013-08-06 DIAGNOSIS — E119 Type 2 diabetes mellitus without complications: Secondary | ICD-10-CM | POA: Diagnosis not present

## 2013-08-06 DIAGNOSIS — J45909 Unspecified asthma, uncomplicated: Secondary | ICD-10-CM | POA: Diagnosis not present

## 2013-08-06 DIAGNOSIS — I10 Essential (primary) hypertension: Secondary | ICD-10-CM | POA: Diagnosis not present

## 2013-08-06 DIAGNOSIS — Z48812 Encounter for surgical aftercare following surgery on the circulatory system: Secondary | ICD-10-CM | POA: Diagnosis not present

## 2013-08-06 DIAGNOSIS — I2510 Atherosclerotic heart disease of native coronary artery without angina pectoris: Secondary | ICD-10-CM | POA: Diagnosis not present

## 2013-08-06 DIAGNOSIS — J449 Chronic obstructive pulmonary disease, unspecified: Secondary | ICD-10-CM | POA: Diagnosis not present

## 2013-08-07 DIAGNOSIS — Z48812 Encounter for surgical aftercare following surgery on the circulatory system: Secondary | ICD-10-CM | POA: Diagnosis not present

## 2013-08-07 DIAGNOSIS — I2510 Atherosclerotic heart disease of native coronary artery without angina pectoris: Secondary | ICD-10-CM | POA: Diagnosis not present

## 2013-08-07 DIAGNOSIS — E119 Type 2 diabetes mellitus without complications: Secondary | ICD-10-CM | POA: Diagnosis not present

## 2013-08-07 DIAGNOSIS — I10 Essential (primary) hypertension: Secondary | ICD-10-CM | POA: Diagnosis not present

## 2013-08-07 DIAGNOSIS — J45909 Unspecified asthma, uncomplicated: Secondary | ICD-10-CM | POA: Diagnosis not present

## 2013-08-07 DIAGNOSIS — J449 Chronic obstructive pulmonary disease, unspecified: Secondary | ICD-10-CM | POA: Diagnosis not present

## 2013-08-08 DIAGNOSIS — I2510 Atherosclerotic heart disease of native coronary artery without angina pectoris: Secondary | ICD-10-CM | POA: Diagnosis not present

## 2013-08-08 DIAGNOSIS — I10 Essential (primary) hypertension: Secondary | ICD-10-CM | POA: Diagnosis not present

## 2013-08-08 DIAGNOSIS — J449 Chronic obstructive pulmonary disease, unspecified: Secondary | ICD-10-CM | POA: Diagnosis not present

## 2013-08-08 DIAGNOSIS — E119 Type 2 diabetes mellitus without complications: Secondary | ICD-10-CM | POA: Diagnosis not present

## 2013-08-08 DIAGNOSIS — J45909 Unspecified asthma, uncomplicated: Secondary | ICD-10-CM | POA: Diagnosis not present

## 2013-08-08 DIAGNOSIS — Z48812 Encounter for surgical aftercare following surgery on the circulatory system: Secondary | ICD-10-CM | POA: Diagnosis not present

## 2013-08-14 DIAGNOSIS — J449 Chronic obstructive pulmonary disease, unspecified: Secondary | ICD-10-CM | POA: Diagnosis not present

## 2013-08-14 DIAGNOSIS — E119 Type 2 diabetes mellitus without complications: Secondary | ICD-10-CM | POA: Diagnosis not present

## 2013-08-14 DIAGNOSIS — Z48812 Encounter for surgical aftercare following surgery on the circulatory system: Secondary | ICD-10-CM | POA: Diagnosis not present

## 2013-08-14 DIAGNOSIS — J45909 Unspecified asthma, uncomplicated: Secondary | ICD-10-CM | POA: Diagnosis not present

## 2013-08-14 DIAGNOSIS — I2510 Atherosclerotic heart disease of native coronary artery without angina pectoris: Secondary | ICD-10-CM | POA: Diagnosis not present

## 2013-08-14 DIAGNOSIS — I10 Essential (primary) hypertension: Secondary | ICD-10-CM | POA: Diagnosis not present

## 2013-08-19 ENCOUNTER — Encounter (HOSPITAL_COMMUNITY): Payer: Medicare Other

## 2013-08-19 ENCOUNTER — Other Ambulatory Visit (HOSPITAL_COMMUNITY): Payer: Medicare Other

## 2013-08-19 ENCOUNTER — Encounter: Payer: Medicare Other | Admitting: Surgery

## 2013-08-23 DIAGNOSIS — I10 Essential (primary) hypertension: Secondary | ICD-10-CM | POA: Diagnosis not present

## 2013-08-23 DIAGNOSIS — Z48812 Encounter for surgical aftercare following surgery on the circulatory system: Secondary | ICD-10-CM | POA: Diagnosis not present

## 2013-08-23 DIAGNOSIS — J45909 Unspecified asthma, uncomplicated: Secondary | ICD-10-CM | POA: Diagnosis not present

## 2013-08-23 DIAGNOSIS — I2510 Atherosclerotic heart disease of native coronary artery without angina pectoris: Secondary | ICD-10-CM | POA: Diagnosis not present

## 2013-08-23 DIAGNOSIS — E119 Type 2 diabetes mellitus without complications: Secondary | ICD-10-CM | POA: Diagnosis not present

## 2013-08-23 DIAGNOSIS — J449 Chronic obstructive pulmonary disease, unspecified: Secondary | ICD-10-CM | POA: Diagnosis not present

## 2013-09-10 DIAGNOSIS — R69 Illness, unspecified: Secondary | ICD-10-CM | POA: Diagnosis not present

## 2013-11-01 ENCOUNTER — Encounter: Payer: Self-pay | Admitting: Surgery

## 2013-11-04 ENCOUNTER — Ambulatory Visit (INDEPENDENT_AMBULATORY_CARE_PROVIDER_SITE_OTHER)
Admission: RE | Admit: 2013-11-04 | Discharge: 2013-11-04 | Disposition: A | Discharge status: Home / Self Care | Payer: Medicare Other | Source: Ambulatory Visit | Attending: Surgery | Admitting: Surgery

## 2013-11-04 ENCOUNTER — Ambulatory Visit (HOSPITAL_COMMUNITY)
Admission: RE | Admit: 2013-11-04 | Discharge: 2013-11-04 | Disposition: A | Discharge status: Home / Self Care | Payer: Medicare Other | Source: Ambulatory Visit | Attending: Surgery | Admitting: Surgery

## 2013-11-04 ENCOUNTER — Encounter: Payer: Self-pay | Admitting: Surgery

## 2013-11-04 ENCOUNTER — Ambulatory Visit (INDEPENDENT_AMBULATORY_CARE_PROVIDER_SITE_OTHER): Payer: Medicare Other | Admitting: Surgery

## 2013-11-04 VITALS — BP 152/83 | HR 94 | Ht 75.0 in | Wt 165.0 lb

## 2013-11-04 DIAGNOSIS — I739 Peripheral vascular disease, unspecified: Secondary | ICD-10-CM | POA: Diagnosis not present

## 2013-11-04 DIAGNOSIS — Z48812 Encounter for surgical aftercare following surgery on the circulatory system: Secondary | ICD-10-CM

## 2013-11-04 NOTE — Progress Notes (Signed)
Patient name: [name] MRN: [id number] DOB: [birth year] Sex: male     Chief Complaint  Patient presents with  . Re-evaluation    3 month f/u     HISTORY OF PRESENT ILLNESS: The patient is back today for followup of his right leg pain.  He underwent stenting of his right superficial femoral artery on 07/09/2013. He has a chronic contracture of his right he has a left above-knee amputation but has not been able to use his prosthesis.  He also complains swelling in his right leg as well as feeling very cold.knee.  Past Medical History  Diagnosis Date  . Asthma   . Hypertension   . Ureter cancer 12/2001    "shut down my kidney & resulted in nephrectomy" (07/09/2013)  . Coronary artery disease   . High cholesterol   . Heart murmur     "had rheumatic fever as a kid" (07/09/2013)  . Rheumatic fever   . Myocardial infarction 03/2002; ~ 2011  . Anginal pain   . Pneumonia     "once" (07/09/2013)  . Shortness of breath     "can happen at anytime" (07/09/2013)  . COPD (chronic obstructive pulmonary disease)     pt denies this hx on 07/09/2013  . Type II diabetes mellitus   . H/O hiatal hernia 1997  . Arthritis     "qwhere" (07/09/2013)  . Gout   . Renal disorder     "only have 1 kidney; due to go back soon to check the other one" (07/09/2013)    Past Surgical History  Procedure Laterality Date  . Nephrectomy Left 12/19/2001  . Femoral artery stent Right 07/09/2013  . Cholecystectomy  1980's  . Coronary artery bypass graft  ~ 2010    "CABG X5" (07/09/2013)  . Glaucoma surgery Bilateral ?[id number]  . Eye surgery    . Carpal tunnel release Bilateral ~2004; ~ 2011  . Trigger finger release Left ~ 1990's  . Cardiac catheterization  ~ 2010    "before the bypass" (07/09/2013)  . Below knee leg amputation Left 08/2009  . Above knee leg amputation Left 09/2009  . Lower extremity angiogram Right Nov. 4, 2014    History   Social History  . Marital Status: Married    Spouse  Name: N/A    Number of Children: N/A  . Years of Education: N/A   Occupational History  . Not on file.   Social History Main Topics  . Smoking status: Current Every Day Smoker -- 1.00 packs/day for 54 years    Types: Cigarettes  . Smokeless tobacco: Never Used  . Alcohol Use: 0.0 oz/week     Comment: 07/09/2013 "couple mixed drinks/week at most"  . Drug Use: No  . Sexual Activity: No   Other Topics Concern  . Not on file   Social History Narrative  . No narrative on file    Family History  Problem Relation Age of Onset  . Cancer Mother   . Heart attack Father     Allergies as of 11/04/2013 - Review Complete 11/04/2013  Allergen Reaction Noted  . Iohexol  07/15/2007  . Penicillins Itching and Swelling   . Erythromycin Swelling and Rash     Current Outpatient Prescriptions on File Prior to Visit  Medication Sig Dispense Refill  . albuterol (PROAIR HFA) 108 (90 BASE) MCG/ACT inhaler Inhale 2 puffs into the lungs every 6 (six) hours as needed for wheezing or shortness of breath.       Marland Kitchen  aspirin EC 81 MG EC tablet Take 1 tablet (81 mg total) by mouth daily.      . citalopram (CELEXA) 20 MG tablet Take 20 mg by mouth daily.       . clopidogrel (PLAVIX) 75 MG tablet Take 1 tablet (75 mg total) by mouth daily with breakfast.  30 tablet  1  . diazepam (VALIUM) 10 MG tablet Take 10 mg by mouth every 6 (six) hours as needed for anxiety.       . diphenhydrAMINE (BENADRYL) 50 MG capsule Take one tablet at 7:30 AM 07/09/13 (take with last dose of Prednisone 50 mg) prior to procedure.  1 capsule  0  . gabapentin (NEURONTIN) 100 MG capsule Take 200 mg by mouth 2 (two) times daily.       Marland Kitchen HYDROcodone-acetaminophen (NORCO) 7.5-325 MG per tablet Take 1 tablet by mouth every 4 (four) hours as needed for pain.       Marland Kitchen HYDROmorphone (DILAUDID) 2 MG tablet Take 4-6 mg by mouth every 4 (four) hours as needed for pain.      . metFORMIN (GLUCOPHAGE) 500 MG tablet Take 500 mg by mouth 2 (two)  times daily with a meal.      . metoprolol (LOPRESSOR) 50 MG tablet Take 50 mg by mouth 2 (two) times daily.      . nitroGLYCERIN (NITROLINGUAL) 0.4 MG/SPRAY spray Place 1 spray under the tongue every 5 (five) minutes as needed for chest pain.      . ramipril (ALTACE) 10 MG capsule Take 10 mg by mouth every morning.      . Tamsulosin HCl (FLOMAX) 0.4 MG CAPS Take 0.4 mg by mouth 2 (two) times daily.       . triazolam (HALCION) 0.25 MG tablet Take 0.5 mg by mouth at bedtime.      Marland Kitchen zolpidem (AMBIEN) 10 MG tablet Take 10-40 mg by mouth at bedtime.        No current facility-administered medications on file prior to visit.     REVIEW OF SYSTEMS: Please see history of present illness, otherwise no changes from prior visit   PHYSICAL EXAMINATION:   Vital signs are BP 152/83  Pulse 94  Ht 6\' 3"  (1.905 m)  Wt 165 lb (74.844 kg)  BMI 20.62 kg/m2  SpO2 100% General: The patient appears their stated age. HEENT:  No gross abnormalities Pulmonary:  Non labored breathing Musculoskeletal: There are no major deformities. Neurologic: No focal weakness or paresthesias are detected, Skin: There are no ulcer or rashes noted. Psychiatric: The patient has normal affect. Cardiovascular: There is a regular rate and rhythm without significant murmur appreciated.   Diagnostic Studies Duplex ultrasound was ordered and reviewed.  This shows ankle-brachial index has increased from 0.62-0.92.  There is a velocity elevation within the mid stent with peak systolic velocity of 353.    Assessment:  right leg pain Plan: The patient continues to complain of right leg pain which I stressed today that I do not feel is vascular in origin.  I reassured him that his stent is widely patent.  There was a small velocity elevation in the midportion which was needed to be followed up in 6 months.  I have scheduled him to start participating in outpatient physical therapy to help with the contracture in his right knee, and  to help him try to get into his oral left above-knee prosthesis.  In addition I am referring him to the pain center to help with his chronic  pain issues which again I do not feel or vascular in etiology   V. Leia Alf, M.D. [facility] [place] Office: [phone] Pager:  [phone]

## 2013-11-05 NOTE — Addendum Note (Signed)
Addended by: Mena Goes on: 11/05/2013 08:06 AM   Modules accepted: Orders

## 2013-11-07 ENCOUNTER — Ambulatory Visit (HOSPITAL_COMMUNITY): Payer: Medicare Other | Admitting: Physical Therapy

## 2013-11-08 ENCOUNTER — Ambulatory Visit (HOSPITAL_COMMUNITY): Payer: Medicare Other | Admitting: Physical Therapy

## 2013-11-15 DIAGNOSIS — G589 Mononeuropathy, unspecified: Secondary | ICD-10-CM | POA: Diagnosis not present

## 2013-11-15 DIAGNOSIS — R5383 Other fatigue: Secondary | ICD-10-CM | POA: Diagnosis not present

## 2013-11-15 DIAGNOSIS — E119 Type 2 diabetes mellitus without complications: Secondary | ICD-10-CM | POA: Diagnosis not present

## 2013-11-15 DIAGNOSIS — G4700 Insomnia, unspecified: Secondary | ICD-10-CM | POA: Diagnosis not present

## 2013-11-15 DIAGNOSIS — R5381 Other malaise: Secondary | ICD-10-CM | POA: Diagnosis not present

## 2013-11-15 DIAGNOSIS — Z681 Body mass index (BMI) 19 or less, adult: Secondary | ICD-10-CM | POA: Diagnosis not present

## 2013-11-21 ENCOUNTER — Telehealth: Payer: Self-pay | Admitting: Surgery

## 2013-11-21 NOTE — Telephone Encounter (Signed)
Confirmed with patient that he has been set up to see [facility] [phone]) and Dorita Fray for Physical Therapy [phone]).

## 2014-01-01 DIAGNOSIS — H5710 Ocular pain, unspecified eye: Secondary | ICD-10-CM | POA: Diagnosis not present

## 2014-02-10 DIAGNOSIS — T1490XA Injury, unspecified, initial encounter: Secondary | ICD-10-CM | POA: Diagnosis not present

## 2014-05-06 ENCOUNTER — Ambulatory Visit (HOSPITAL_COMMUNITY): Payer: Medicare Other | Attending: Surgery | Admitting: Physical Therapy

## 2014-05-16 ENCOUNTER — Encounter: Payer: Self-pay | Admitting: Surgery

## 2014-05-19 ENCOUNTER — Other Ambulatory Visit (HOSPITAL_COMMUNITY): Payer: Medicare Other

## 2014-05-19 ENCOUNTER — Encounter (HOSPITAL_COMMUNITY): Payer: Medicare Other

## 2014-05-19 ENCOUNTER — Ambulatory Visit: Payer: Medicare Other | Admitting: Surgery

## 2014-05-20 ENCOUNTER — Ambulatory Visit (HOSPITAL_COMMUNITY): Payer: Medicare Other | Admitting: Physical Therapy

## 2014-05-21 ENCOUNTER — Ambulatory Visit (HOSPITAL_COMMUNITY): Payer: Medicare Other | Admitting: Physical Therapy

## 2014-06-02 DIAGNOSIS — R040 Epistaxis: Secondary | ICD-10-CM | POA: Diagnosis not present

## 2014-06-02 DIAGNOSIS — L03119 Cellulitis of unspecified part of limb: Secondary | ICD-10-CM | POA: Diagnosis not present

## 2014-06-02 DIAGNOSIS — L02519 Cutaneous abscess of unspecified hand: Secondary | ICD-10-CM | POA: Diagnosis not present

## 2014-06-02 DIAGNOSIS — G4700 Insomnia, unspecified: Secondary | ICD-10-CM | POA: Diagnosis not present

## 2014-06-02 DIAGNOSIS — Z23 Encounter for immunization: Secondary | ICD-10-CM | POA: Diagnosis not present

## 2014-06-02 DIAGNOSIS — G8929 Other chronic pain: Secondary | ICD-10-CM | POA: Diagnosis not present

## 2014-06-02 DIAGNOSIS — Z125 Encounter for screening for malignant neoplasm of prostate: Secondary | ICD-10-CM | POA: Diagnosis not present

## 2014-06-02 DIAGNOSIS — Z681 Body mass index (BMI) 19 or less, adult: Secondary | ICD-10-CM | POA: Diagnosis not present

## 2014-06-02 DIAGNOSIS — I10 Essential (primary) hypertension: Secondary | ICD-10-CM | POA: Diagnosis not present

## 2014-08-14 ENCOUNTER — Encounter (HOSPITAL_COMMUNITY): Payer: Self-pay | Admitting: Surgery

## 2014-08-15 DIAGNOSIS — R040 Epistaxis: Secondary | ICD-10-CM | POA: Diagnosis not present

## 2014-08-15 DIAGNOSIS — B355 Tinea imbricata: Secondary | ICD-10-CM | POA: Diagnosis not present

## 2014-08-15 DIAGNOSIS — Z681 Body mass index (BMI) 19 or less, adult: Secondary | ICD-10-CM | POA: Diagnosis not present

## 2014-08-20 ENCOUNTER — Encounter (HOSPITAL_COMMUNITY): Payer: Self-pay | Admitting: *Deleted

## 2014-08-20 ENCOUNTER — Emergency Department (HOSPITAL_COMMUNITY)
Admission: EM | Admit: 2014-08-20 | Discharge: 2014-08-20 | Disposition: A | Discharge status: Home / Self Care | Payer: Medicare Other | Attending: Emergency Medicine | Admitting: Emergency Medicine

## 2014-08-20 DIAGNOSIS — Z7902 Long term (current) use of antithrombotics/antiplatelets: Secondary | ICD-10-CM | POA: Insufficient documentation

## 2014-08-20 DIAGNOSIS — Z8701 Personal history of pneumonia (recurrent): Secondary | ICD-10-CM | POA: Diagnosis not present

## 2014-08-20 DIAGNOSIS — Y9389 Activity, other specified: Secondary | ICD-10-CM | POA: Insufficient documentation

## 2014-08-20 DIAGNOSIS — Z79899 Other long term (current) drug therapy: Secondary | ICD-10-CM | POA: Diagnosis not present

## 2014-08-20 DIAGNOSIS — Y998 Other external cause status: Secondary | ICD-10-CM | POA: Insufficient documentation

## 2014-08-20 DIAGNOSIS — E119 Type 2 diabetes mellitus without complications: Secondary | ICD-10-CM | POA: Insufficient documentation

## 2014-08-20 DIAGNOSIS — Z720 Tobacco use: Secondary | ICD-10-CM | POA: Insufficient documentation

## 2014-08-20 DIAGNOSIS — Z8554 Personal history of malignant neoplasm of ureter: Secondary | ICD-10-CM | POA: Diagnosis not present

## 2014-08-20 DIAGNOSIS — S0181XA Laceration without foreign body of other part of head, initial encounter: Secondary | ICD-10-CM | POA: Insufficient documentation

## 2014-08-20 DIAGNOSIS — Y9289 Other specified places as the place of occurrence of the external cause: Secondary | ICD-10-CM | POA: Insufficient documentation

## 2014-08-20 DIAGNOSIS — Z87448 Personal history of other diseases of urinary system: Secondary | ICD-10-CM | POA: Diagnosis not present

## 2014-08-20 DIAGNOSIS — Z7982 Long term (current) use of aspirin: Secondary | ICD-10-CM | POA: Diagnosis not present

## 2014-08-20 DIAGNOSIS — I252 Old myocardial infarction: Secondary | ICD-10-CM | POA: Diagnosis not present

## 2014-08-20 DIAGNOSIS — R011 Cardiac murmur, unspecified: Secondary | ICD-10-CM | POA: Diagnosis not present

## 2014-08-20 DIAGNOSIS — W228XXA Striking against or struck by other objects, initial encounter: Secondary | ICD-10-CM | POA: Insufficient documentation

## 2014-08-20 DIAGNOSIS — I10 Essential (primary) hypertension: Secondary | ICD-10-CM | POA: Insufficient documentation

## 2014-08-20 DIAGNOSIS — Z23 Encounter for immunization: Secondary | ICD-10-CM | POA: Insufficient documentation

## 2014-08-20 DIAGNOSIS — Z8619 Personal history of other infectious and parasitic diseases: Secondary | ICD-10-CM | POA: Diagnosis not present

## 2014-08-20 DIAGNOSIS — Z880 Allergy status to penicillin: Secondary | ICD-10-CM | POA: Insufficient documentation

## 2014-08-20 DIAGNOSIS — Z8719 Personal history of other diseases of the digestive system: Secondary | ICD-10-CM | POA: Insufficient documentation

## 2014-08-20 DIAGNOSIS — I25119 Atherosclerotic heart disease of native coronary artery with unspecified angina pectoris: Secondary | ICD-10-CM | POA: Insufficient documentation

## 2014-08-20 DIAGNOSIS — M1990 Unspecified osteoarthritis, unspecified site: Secondary | ICD-10-CM | POA: Insufficient documentation

## 2014-08-20 DIAGNOSIS — Z951 Presence of aortocoronary bypass graft: Secondary | ICD-10-CM | POA: Insufficient documentation

## 2014-08-20 MED ORDER — LIDOCAINE HCL (PF) 1 % IJ SOLN: 5.0000 mL | Freq: Once | INTRAMUSCULAR | Status: DC

## 2014-08-20 MED ORDER — BACITRACIN ZINC 500 UNIT/GM EX OINT
TOPICAL_OINTMENT | CUTANEOUS | Status: AC
  Administered 2014-08-20: 20:00:00
  Filled 2014-08-20: qty 0.9

## 2014-08-20 MED ORDER — TETANUS-DIPHTH-ACELL PERTUSSIS 5-2.5-18.5 LF-MCG/0.5 IM SUSP
0.5000 mL | Freq: Once | INTRAMUSCULAR | Status: AC
  Administered 2014-08-20: 0.5 mL via INTRAMUSCULAR
  Filled 2014-08-20: qty 0.5

## 2014-08-20 MED ORDER — LIDOCAINE HCL (PF) 1 % IJ SOLN
INTRAMUSCULAR | Status: AC
  Administered 2014-08-20: 5 mL
  Filled 2014-08-20: qty 5
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
## 2014-08-20 NOTE — Discharge Instructions (Signed)
Facial Laceration °A facial laceration is a cut on the face. These injuries can be painful and cause bleeding. Some cuts may need to be closed with stitches (sutures), skin adhesive strips, or wound glue. Cuts usually heal quickly but can leave a scar. It can take 1-2 years for the scar to go away completely. °HOME CARE  °· Only take medicines as told by your doctor. °· Follow your doctor's instructions for wound care. °For Stitches: °· Keep the cut clean and dry. °· If you have a bandage (dressing), change it at least once a day. Change the bandage if it gets wet or dirty, or as told by your doctor. °· Wash the cut with soap and water 2 times a day. Rinse the cut with water. Pat it dry with a clean towel. °· Put a thin layer of medicated cream on the cut as told by your doctor. °· You may shower after the first 24 hours. Do not soak the cut in water until the stitches are removed. °· Have your stitches removed as told by your doctor. °· Do not wear any makeup until a few days after your stitches are removed. °For Skin Adhesive Strips: °· Keep the cut clean and dry. °· Do not get the strips wet. You may take a bath, but be careful to keep the cut dry. °· If the cut gets wet, pat it dry with a clean towel. °· The strips will fall off on their own. Do not remove the strips that are still stuck to the cut. °For Wound Glue: °· You may shower or take baths. Do not soak or scrub the cut. Do not swim. Avoid heavy sweating until the glue falls off on its own. After a shower or bath, pat the cut dry with a clean towel. °· Do not put medicine or makeup on your cut until the glue falls off. °· If you have a bandage, do not put tape over the glue. °· Avoid lots of sunlight or tanning lamps until the glue falls off. °· The glue will fall off on its own in 5-10 days. Do not pick at the glue. °After Healing: °Put sunscreen on the cut for the first year to reduce your scar. °GET HELP RIGHT AWAY IF:  °· Your cut area gets red,  painful, or puffy (swollen). °· You see a yellowish-white fluid (pus) coming from the cut. °· You have chills or a fever. °MAKE SURE YOU:  °· Understand these instructions. °· Will watch your condition. °· Will get help right away if you are not doing well or get worse. °Document Released: 02/08/2008 Document Revised: 06/12/2013 Document Reviewed: 04/04/2013 °[employer]® Patient Information ©2015 [employer]. This information is not intended to replace advice given to you by your health care provider. Make sure you discuss any questions you have with your health care provider. ° °
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
## 2014-08-26 ENCOUNTER — Emergency Department (HOSPITAL_COMMUNITY): Payer: Medicare Other

## 2014-08-26 ENCOUNTER — Encounter (HOSPITAL_COMMUNITY): Payer: Self-pay

## 2014-08-26 ENCOUNTER — Inpatient Hospital Stay (HOSPITAL_COMMUNITY)
Admission: EM | Admit: 2014-08-26 | Discharge: 2014-08-27 | DRG: 072 | Disposition: A | Discharge status: Home / Self Care | Payer: Medicare Other | Attending: Internal Medicine | Admitting: Internal Medicine

## 2014-08-26 DIAGNOSIS — R410 Disorientation, unspecified: Secondary | ICD-10-CM

## 2014-08-26 DIAGNOSIS — Z8554 Personal history of malignant neoplasm of ureter: Secondary | ICD-10-CM | POA: Diagnosis not present

## 2014-08-26 DIAGNOSIS — Z95820 Peripheral vascular angioplasty status with implants and grafts: Secondary | ICD-10-CM

## 2014-08-26 DIAGNOSIS — I2510 Atherosclerotic heart disease of native coronary artery without angina pectoris: Secondary | ICD-10-CM | POA: Diagnosis present

## 2014-08-26 DIAGNOSIS — W050XXA Fall from non-moving wheelchair, initial encounter: Secondary | ICD-10-CM | POA: Diagnosis present

## 2014-08-26 DIAGNOSIS — Z951 Presence of aortocoronary bypass graft: Secondary | ICD-10-CM

## 2014-08-26 DIAGNOSIS — Z881 Allergy status to other antibiotic agents status: Secondary | ICD-10-CM

## 2014-08-26 DIAGNOSIS — Z7982 Long term (current) use of aspirin: Secondary | ICD-10-CM

## 2014-08-26 DIAGNOSIS — E119 Type 2 diabetes mellitus without complications: Secondary | ICD-10-CM | POA: Diagnosis not present

## 2014-08-26 DIAGNOSIS — E860 Dehydration: Secondary | ICD-10-CM | POA: Diagnosis present

## 2014-08-26 DIAGNOSIS — R4182 Altered mental status, unspecified: Secondary | ICD-10-CM | POA: Diagnosis not present

## 2014-08-26 DIAGNOSIS — I10 Essential (primary) hypertension: Secondary | ICD-10-CM | POA: Diagnosis present

## 2014-08-26 DIAGNOSIS — J45909 Unspecified asthma, uncomplicated: Secondary | ICD-10-CM | POA: Diagnosis present

## 2014-08-26 DIAGNOSIS — Z905 Acquired absence of kidney: Secondary | ICD-10-CM | POA: Diagnosis present

## 2014-08-26 DIAGNOSIS — Z809 Family history of malignant neoplasm, unspecified: Secondary | ICD-10-CM

## 2014-08-26 DIAGNOSIS — E780 Pure hypercholesterolemia: Secondary | ICD-10-CM | POA: Diagnosis present

## 2014-08-26 DIAGNOSIS — F1010 Alcohol abuse, uncomplicated: Secondary | ICD-10-CM | POA: Diagnosis present

## 2014-08-26 DIAGNOSIS — Z8249 Family history of ischemic heart disease and other diseases of the circulatory system: Secondary | ICD-10-CM

## 2014-08-26 DIAGNOSIS — I252 Old myocardial infarction: Secondary | ICD-10-CM | POA: Diagnosis not present

## 2014-08-26 DIAGNOSIS — R404 Transient alteration of awareness: Secondary | ICD-10-CM | POA: Diagnosis not present

## 2014-08-26 DIAGNOSIS — Z888 Allergy status to other drugs, medicaments and biological substances status: Secondary | ICD-10-CM

## 2014-08-26 DIAGNOSIS — G9340 Encephalopathy, unspecified: Principal | ICD-10-CM | POA: Diagnosis present

## 2014-08-26 DIAGNOSIS — Z720 Tobacco use: Secondary | ICD-10-CM | POA: Diagnosis not present

## 2014-08-26 DIAGNOSIS — Z89612 Acquired absence of left leg above knee: Secondary | ICD-10-CM | POA: Diagnosis not present

## 2014-08-26 DIAGNOSIS — Z880 Allergy status to penicillin: Secondary | ICD-10-CM | POA: Diagnosis not present

## 2014-08-26 DIAGNOSIS — J449 Chronic obstructive pulmonary disease, unspecified: Secondary | ICD-10-CM | POA: Diagnosis present

## 2014-08-26 DIAGNOSIS — S0181XA Laceration without foreign body of other part of head, initial encounter: Secondary | ICD-10-CM | POA: Diagnosis present

## 2014-08-26 DIAGNOSIS — R32 Unspecified urinary incontinence: Secondary | ICD-10-CM | POA: Diagnosis present

## 2014-08-26 DIAGNOSIS — W19XXXA Unspecified fall, initial encounter: Secondary | ICD-10-CM

## 2014-08-26 DIAGNOSIS — G4700 Insomnia, unspecified: Secondary | ICD-10-CM | POA: Diagnosis present

## 2014-08-26 LAB — URINE MICROSCOPIC-ADD ON

## 2014-08-26 LAB — GLUCOSE, CAPILLARY: Glucose-Capillary: 107 mg/dL — ABNORMAL HIGH (ref 70–99)

## 2014-08-26 LAB — TROPONIN I: TROPONIN I: 0.03 ng/mL (ref <0.031)

## 2014-08-26 LAB — LACTIC ACID, PLASMA: LACTIC ACID, VENOUS: 0.7 mmol/L (ref 0.5–2.2)

## 2014-08-26 LAB — COMPREHENSIVE METABOLIC PANEL
ALT: 9 U/L (ref 0–53)
AST: 19 U/L (ref 0–37)
Albumin: 3.5 g/dL (ref 3.5–5.2)
Alkaline Phosphatase: 52 U/L (ref 39–117)
Anion gap: 10 (ref 5–15)
BUN: 21 mg/dL (ref 6–23)
CALCIUM: 9.2 mg/dL (ref 8.4–10.5)
CO2: 22 mmol/L (ref 19–32)
CREATININE: 1.5 mg/dL — AB (ref 0.50–1.35)
Chloride: 101 mEq/L (ref 96–112)
GFR calc non Af Amer: 45 mL/min — ABNORMAL LOW (ref >90)
GFR, EST AFRICAN AMERICAN: 52 mL/min — AB (ref >90)
Glucose, Bld: 82 mg/dL (ref 70–99)
Potassium: 5.7 mmol/L — ABNORMAL HIGH (ref 3.5–5.1)
SODIUM: 133 mmol/L — AB (ref 135–145)
TOTAL PROTEIN: 7.4 g/dL (ref 6.0–8.3)
Total Bilirubin: 1 mg/dL (ref 0.3–1.2)

## 2014-08-26 LAB — CBG MONITORING, ED
Glucose-Capillary: 75 mg/dL (ref 70–99)
Glucose-Capillary: 73 mg/dL (ref 70–99)

## 2014-08-26 LAB — POTASSIUM: POTASSIUM: 5.1 mmol/L (ref 3.5–5.1)

## 2014-08-26 LAB — URINALYSIS, ROUTINE W REFLEX MICROSCOPIC
BILIRUBIN URINE: NEGATIVE
Glucose, UA: NEGATIVE mg/dL
KETONES UR: 15 mg/dL — AB
Leukocytes, UA: NEGATIVE
NITRITE: NEGATIVE
Protein, ur: NEGATIVE mg/dL
SPECIFIC GRAVITY, URINE: 1.02 (ref 1.005–1.030)
Urobilinogen, UA: 0.2 mg/dL (ref 0.0–1.0)
pH: 5.5 (ref 5.0–8.0)

## 2014-08-26 LAB — PROTIME-INR
INR: 1.13 (ref 0.00–1.49)
Prothrombin Time: 14.6 seconds (ref 11.6–15.2)

## 2014-08-26 LAB — CBC
HCT: 38.8 % — ABNORMAL LOW (ref 39.0–52.0)
Hemoglobin: 12.5 g/dL — ABNORMAL LOW (ref 13.0–17.0)
MCH: 33 pg (ref 26.0–34.0)
MCHC: 32.2 g/dL (ref 30.0–36.0)
MCV: 102.4 fL — AB (ref 78.0–100.0)
PLATELETS: 197 10*3/uL (ref 150–400)
RBC: 3.79 MIL/uL — ABNORMAL LOW (ref 4.22–5.81)
RDW: 14.3 % (ref 11.5–15.5)
WBC: 4.6 10*3/uL (ref 4.0–10.5)

## 2014-08-26 LAB — ETHANOL: Alcohol, Ethyl (B): <5 mg/dL (ref 0–9)

## 2014-08-26 LAB — AMMONIA: AMMONIA: 9 umol/L — AB (ref 11–32)

## 2014-08-26 MED ORDER — HYDROCODONE-ACETAMINOPHEN 7.5-325 MG PO TABS
1.0000 | ORAL_TABLET | Freq: Four times a day (QID) | ORAL | Status: DC | PRN
Start: 2014-08-26 — End: 2014-08-27

## 2014-08-26 MED ORDER — KETOCONAZOLE 200 MG PO TABS: 200.0000 mg | ORAL_TABLET | Freq: Every day | ORAL | Status: DC

## 2014-08-26 MED ORDER — TAMSULOSIN HCL 0.4 MG PO CAPS
0.4000 mg | ORAL_CAPSULE | Freq: Two times a day (BID) | ORAL | Status: DC
  Administered 2014-08-26 – 2014-08-27 (×2): 0.4 mg via ORAL
  Filled 2014-08-26 (×2): qty 1

## 2014-08-26 MED ORDER — THIAMINE HCL 100 MG/ML IJ SOLN
100.0000 mg | Freq: Once | INTRAMUSCULAR | Status: AC
  Administered 2014-08-26: 100 mg via INTRAVENOUS
  Filled 2014-08-26: qty 2

## 2014-08-26 MED ORDER — FOLIC ACID 5 MG/ML IJ SOLN
1.0000 mg | Freq: Once | INTRAMUSCULAR | Status: AC
  Administered 2014-08-26: 1 mg via INTRAVENOUS
  Filled 2014-08-26: qty 0.2

## 2014-08-26 MED ORDER — HEPARIN SODIUM (PORCINE) 5000 UNIT/ML IJ SOLN
5000.0000 [IU] | Freq: Three times a day (TID) | INTRAMUSCULAR | Status: DC
  Administered 2014-08-26 – 2014-08-27 (×3): 5000 [IU] via SUBCUTANEOUS
  Filled 2014-08-26 (×3): qty 1

## 2014-08-26 MED ORDER — ONDANSETRON HCL 4 MG/2ML IJ SOLN: 4.0000 mg | Freq: Four times a day (QID) | INTRAMUSCULAR | Status: DC | PRN

## 2014-08-26 MED ORDER — SODIUM CHLORIDE 0.9 % IV SOLN: INTRAVENOUS | Status: DC

## 2014-08-26 MED ORDER — DIAZEPAM 5 MG PO TABS: 5.0000 mg | ORAL_TABLET | Freq: Four times a day (QID) | ORAL | Status: DC | PRN

## 2014-08-26 MED ORDER — GABAPENTIN 100 MG PO CAPS
200.0000 mg | ORAL_CAPSULE | Freq: Three times a day (TID) | ORAL | Status: DC
  Administered 2014-08-26 – 2014-08-27 (×3): 200 mg via ORAL
  Filled 2014-08-26 (×3): qty 2

## 2014-08-26 MED ORDER — SODIUM CHLORIDE 0.9 % IV BOLUS (SEPSIS)
1000.0000 mL | Freq: Once | INTRAVENOUS | Status: AC
  Administered 2014-08-26: 1000 mL via INTRAVENOUS

## 2014-08-26 MED ORDER — SODIUM CHLORIDE 0.9 % IV BOLUS (SEPSIS)
500.0000 mL | Freq: Once | INTRAVENOUS | Status: AC
  Administered 2014-08-26: 500 mL via INTRAVENOUS

## 2014-08-26 MED ORDER — ALBUTEROL SULFATE (2.5 MG/3ML) 0.083% IN NEBU: 3.0000 mL | INHALATION_SOLUTION | Freq: Four times a day (QID) | RESPIRATORY_TRACT | Status: DC | PRN

## 2014-08-26 MED ORDER — NITROGLYCERIN 0.4 MG/SPRAY TL SOLN
1.0000 | Status: DC | PRN
  Filled 2014-08-26: qty 4.9

## 2014-08-26 MED ORDER — ALBUTEROL SULFATE HFA 108 (90 BASE) MCG/ACT IN AERS
2.0000 | INHALATION_SPRAY | Freq: Four times a day (QID) | RESPIRATORY_TRACT | Status: DC | PRN
  Filled 2014-08-26: qty 6.7

## 2014-08-26 MED ORDER — FOLIC ACID 1 MG PO TABS
1.0000 mg | ORAL_TABLET | Freq: Every day | ORAL | Status: DC
  Administered 2014-08-27: 1 mg via ORAL
  Filled 2014-08-26: qty 1

## 2014-08-26 MED ORDER — VITAMIN B-1 100 MG PO TABS
100.0000 mg | ORAL_TABLET | Freq: Every day | ORAL | Status: DC
  Administered 2014-08-27: 100 mg via ORAL
  Filled 2014-08-26: qty 1

## 2014-08-26 MED ORDER — TRIAZOLAM 0.125 MG PO TABS
0.5000 mg | ORAL_TABLET | Freq: Every day | ORAL | Status: DC
  Administered 2014-08-26: 0.5 mg via ORAL
  Filled 2014-08-26: qty 4

## 2014-08-26 MED ORDER — ASPIRIN EC 81 MG PO TBEC
81.0000 mg | DELAYED_RELEASE_TABLET | Freq: Every day | ORAL | Status: DC
  Administered 2014-08-27: 81 mg via ORAL
  Filled 2014-08-26: qty 1

## 2014-08-26 MED ORDER — METFORMIN HCL 500 MG PO TABS: 500.0000 mg | ORAL_TABLET | Freq: Two times a day (BID) | ORAL | Status: DC

## 2014-08-26 MED ORDER — ONDANSETRON HCL 4 MG/2ML IJ SOLN
4.0000 mg | Freq: Once | INTRAMUSCULAR | Status: AC
  Administered 2014-08-26: 4 mg via INTRAVENOUS
  Filled 2014-08-26: qty 2

## 2014-08-26 MED ORDER — INSULIN ASPART 100 UNIT/ML ~~LOC~~ SOLN
0.0000 [IU] | Freq: Three times a day (TID) | SUBCUTANEOUS | Status: DC
  Administered 2014-08-27: 2 [IU] via SUBCUTANEOUS

## 2014-08-26 MED ORDER — KETOCONAZOLE 2 % EX CREA
1.0000 "application " | TOPICAL_CREAM | Freq: Every day | CUTANEOUS | Status: DC
  Filled 2014-08-26: qty 15

## 2014-08-26 MED ORDER — DEXTROSE-NACL 5-0.9 % IV SOLN
INTRAVENOUS | Status: DC
  Administered 2014-08-26: 21:00:00 via INTRAVENOUS

## 2014-08-26 MED ORDER — SODIUM CHLORIDE 0.9 % IV SOLN
INTRAVENOUS | Status: DC
  Administered 2014-08-26: 23:00:00 via INTRAVENOUS

## 2014-08-26 MED ORDER — INSULIN ASPART 100 UNIT/ML ~~LOC~~ SOLN: 0.0000 [IU] | Freq: Every day | SUBCUTANEOUS | Status: DC

## 2014-08-26 MED ORDER — ZOLPIDEM TARTRATE 5 MG PO TABS
5.0000 mg | ORAL_TABLET | Freq: Every day | ORAL | Status: DC
  Administered 2014-08-26: 5 mg via ORAL

## 2014-08-26 MED ORDER — METOPROLOL TARTRATE 50 MG PO TABS
50.0000 mg | ORAL_TABLET | Freq: Two times a day (BID) | ORAL | Status: DC
  Administered 2014-08-26 – 2014-08-27 (×2): 50 mg via ORAL
  Filled 2014-08-26 (×2): qty 1

## 2014-08-26 MED ORDER — CITALOPRAM HYDROBROMIDE 20 MG PO TABS
20.0000 mg | ORAL_TABLET | Freq: Every day | ORAL | Status: DC
  Administered 2014-08-27: 20 mg via ORAL
  Filled 2014-08-26 (×3): qty 1

## 2014-08-26 MED ORDER — ONDANSETRON HCL 4 MG PO TABS: 4.0000 mg | ORAL_TABLET | Freq: Four times a day (QID) | ORAL | Status: DC | PRN

## 2014-08-26 MED ORDER — RAMIPRIL 5 MG PO CAPS
10.0000 mg | ORAL_CAPSULE | Freq: Every morning | ORAL | Status: DC
  Administered 2014-08-27: 10 mg via ORAL
  Filled 2014-08-26 (×2): qty 1
  Filled 2014-08-26: qty 2

## 2014-08-26 MED ORDER — DEXTROSE-NACL 5-0.45 % IV SOLN: INTRAVENOUS | Status: DC

## 2014-08-26 MED ORDER — ZOLPIDEM TARTRATE 5 MG PO TABS
10.0000 mg | ORAL_TABLET | Freq: Every day | ORAL | Status: DC
  Filled 2014-08-26: qty 2

## 2014-08-26 NOTE — H&P (Signed)
[facility] History and Physical  [name] CHE:[id number] DOB: 13-Mar-1941 DOA: 08/26/2014  Referring physician: ER PCP: Leonides Grills, MD   Chief Complaint: Altered mental status  HPI: [name] is a 73 y.o. male  This is a 73 year old man who presents with history of altered mental status. The wife, who was present earlier and gave the history to the emergency room physician, said that she had left the house for approximately 2-1/2 hours and returned to find the patient laying on the floor. The patient has been confused since last night. According to the wife, the patient has a history of alcohol abuse and has not been eating food or drinking water for the last 4 days, except with the exception of sips of water to take his medications. He also has had urinary incontinence. He has had some dry heaving. He denies any abdominal pain. The patient himself tells me he feels fine now, apparently felt more unwell earlier. He cannot specify. 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 The patient has a history of diabetes on only one oral hypoglycemic agent. The patient cannot tell me when he last drink alcohol. He says he drinks vodka 4-5 drinks, maybe every other day, however I suspect this may be more frequent. Evaluation in the emergency room shows that his creatinine is increased. CT brain scan is unremarkable. He is now being admitted for further investigation and management.   Review of Systems:  Apart from the symptoms mentioned above, all other systems negative. The patient cannot give me any clear review of systems due to the altered mental status.  Past Medical History  Diagnosis Date  . Asthma   . Hypertension   . Ureter cancer 12/2001    "shut down my kidney & resulted in nephrectomy" (07/09/2013)  . Coronary artery disease   . High cholesterol   . Heart murmur     "had rheumatic fever as a kid" (07/09/2013)  . Rheumatic fever   .  Myocardial infarction 03/2002; ~ 2011  . Anginal pain   . Pneumonia     "once" (07/09/2013)  . Shortness of breath     "can happen at anytime" (07/09/2013)  . COPD (chronic obstructive pulmonary disease)     pt denies this hx on 07/09/2013  . Type II diabetes mellitus   . H/O hiatal hernia 1997  . Arthritis     "qwhere" (07/09/2013)  . Gout   . Renal disorder     "only have 1 kidney; due to go back soon to check the other one" (07/09/2013)   Past Surgical History  Procedure Laterality Date  . Nephrectomy Left 12/19/2001  . Femoral artery stent Right 07/09/2013  . Cholecystectomy  1980's  . Coronary artery bypass graft  ~ 2010    "CABG X5" (07/09/2013)  . Glaucoma surgery Bilateral ?[id number]'V  . Eye surgery    . Carpal tunnel release Bilateral ~2004; ~ 2011  . Trigger finger release Left ~ 1990's  . Cardiac catheterization  ~ 2010    "before the bypass" (07/09/2013)  . Below knee leg amputation Left 08/2009  . Above knee leg amputation Left 09/2009  . Lower extremity angiogram Right Nov. 4, 2014  . Lower extremity angiogram Right 07/09/2013    Procedure: LOWER EXTREMITY ANGIOGRAM;  Surgeon: Serafina Mitchell, MD;  Location: [facility] CATH LAB;  Service: Cardiovascular;  Laterality: Right;   Social History:  reports that he has been smoking Cigarettes.  He has a 54 pack-year smoking history. He has never used smokeless tobacco. He reports that he drinks alcohol. He reports that he does not use illicit drugs.  Allergies  Allergen Reactions  . Iohexol      Code: HIVES, Desc: PT REPORTS THROAT SWELLING, HIVES AND ITCHING, 13 HR PRE-MED GIVEN AND SUCCESSFUL-ARS 07/15/07   . Penicillins Itching and Swelling    Throat swelling  . Erythromycin Swelling and Rash    Throat swelling    Family History  Problem Relation Age of Onset  . Cancer Mother   . Heart attack Father      Prior to Admission medications   Medication Sig Start Date End Date Taking? Authorizing Provider  albuterol (PROAIR  HFA) 108 (90 BASE) MCG/ACT inhaler Inhale 2 puffs into the lungs every 6 (six) hours as needed for wheezing or shortness of breath.    Yes Historical Provider, MD  aspirin EC 81 MG EC tablet Take 1 tablet (81 mg total) by mouth daily. 07/10/13  Yes Regina J Roczniak, PA-C  citalopram (CELEXA) 20 MG tablet Take 20 mg by mouth daily.    Yes Historical Provider, MD  gabapentin (NEURONTIN) 100 MG capsule Take 200 mg by mouth 3 (three) times daily.    Yes Historical Provider, MD  ketoconazole (NIZORAL) 2 % cream Apply 1 application topically daily. 2 week course starting on 08/15/2014   Yes Historical Provider, MD  ketoconazole (NIZORAL) 200 MG tablet Take 200 mg by mouth daily. 2 week course starting on 08/15/2014   Yes Historical Provider, MD  metFORMIN (GLUCOPHAGE) 500 MG tablet Take 500 mg by mouth 2 (two) times daily with a meal.   Yes Historical Provider, MD  metoprolol (LOPRESSOR) 50 MG tablet Take 50 mg by mouth 2 (two) times daily.   Yes Historical Provider, MD  mupirocin ointment (BACTROBAN) 2 % Place 1 application into the nose 3 (three) times daily as needed.   Yes Historical Provider, MD  nitroGLYCERIN (NITROLINGUAL) 0.4 MG/SPRAY spray Place 1 spray under the tongue every 5 (five) minutes as needed for chest pain.   Yes Historical Provider, MD  ramipril (ALTACE) 10 MG capsule Take 10 mg by mouth every morning.   Yes Historical Provider, MD  Tamsulosin HCl (FLOMAX) 0.4 MG CAPS Take 0.4 mg by mouth 2 (two) times daily.    Yes Historical Provider, MD  triazolam (HALCION) 0.25 MG tablet Take 0.5 mg by mouth at bedtime.   Yes Historical Provider, MD  zolpidem (AMBIEN) 10 MG tablet Take 40 mg by mouth at bedtime.    Yes Historical Provider, MD  clopidogrel (PLAVIX) 75 MG tablet Take 1 tablet (75 mg total) by mouth daily with breakfast. Patient not taking: Reported on 08/26/2014 07/10/13   Nancy Nordmann Roczniak, PA-C  diazepam (VALIUM) 10 MG tablet Take 10 mg by mouth every 6 (six) hours as needed for  anxiety.     Historical Provider, MD  diphenhydrAMINE (BENADRYL) 50 MG capsule Take one tablet at 7:30 AM 07/09/13 (take with last dose of Prednisone 50 mg) prior to procedure. 07/05/13   Serafina Mitchell, MD  HYDROcodone-acetaminophen (NORCO) 7.5-325 MG per tablet Take 1 tablet by mouth every 4 (four) hours as needed for pain.     Historical Provider, MD   Physical Exam: Filed Vitals:   08/26/14 1430 08/26/14 1545 08/26/14 1630 08/26/14 1959  BP: 166/92  146/103 154/100  Pulse:  86 90 88  Temp:      TempSrc:  Resp:    18  Height:      SpO2:  98% 94% 94%    Wt Readings from Last 3 Encounters:  08/20/14 83.915 kg (185 lb)  11/04/13 74.844 kg (165 lb)  08/05/13 74.844 kg (165 lb)    General:  Appears calm and comfortable. The patient appears somewhat orientated in time place and person, he does not look delirious. He does not look like he is in alcohol withdrawal at the present time. He does look somewhat clinically dehydrated. Eyes: PERRL, normal lids, irises & conjunctiva ENT: grossly normal hearing, lips & tongue Neck: no LAD, masses or thyromegaly Cardiovascular: RRR, no m/r/g. No LE edema. Telemetry: SR, no arrhythmias  Respiratory: CTA bilaterally, no w/r/r. Normal respiratory effort. Abdomen: soft, ntnd Skin: no rash or induration seen on limited exam Musculoskeletal: Left above-knee amputation. Psychiatric: grossly normal mood and affect, speech fluent and appropriate Neurologic: grossly non-focal.          Labs on Admission:  Basic Metabolic Panel:  Recent Labs Lab 08/26/14 1222 08/26/14 1357  NA 133*  --   K 5.7* 5.1  CL 101  --   CO2 22  --   GLUCOSE 82  --   BUN 21  --   CREATININE 1.50*  --   CALCIUM 9.2  --    Liver Function Tests:  Recent Labs Lab 08/26/14 1222  AST 19  ALT 9  ALKPHOS 52  BILITOT 1.0  PROT 7.4  ALBUMIN 3.5   No results for input(s): LIPASE, AMYLASE in the last 168 hours.  Recent Labs Lab 08/26/14 1357  AMMONIA 9*    CBC:  Recent Labs Lab 08/26/14 1222  WBC 4.6  HGB 12.5*  HCT 38.8*  MCV 102.4*  PLT 197   Cardiac Enzymes:  Recent Labs Lab 08/26/14 1357  TROPONINI 0.03    BNP (last 3 results) No results for input(s): PROBNP in the last 8760 hours. CBG:  Recent Labs Lab 08/26/14 1219 08/26/14 1946  GLUCAP 75 73    Radiological Exams on Admission: Ct Head Wo Contrast  08/26/2014   CLINICAL DATA:  73 year old male with altered mental status, found down. Initial encounter.  EXAM: CT HEAD WITHOUT CONTRAST  TECHNIQUE: Contiguous axial images were obtained from the base of the skull through the vertex without intravenous contrast.  COMPARISON:  Head CT without contrast 02/03/2012.  FINDINGS: Paranasal sinuses now are clear. Mastoids are clear. No acute osseous abnormality identified.  Visualized orbit soft tissues are within normal limits. No acute scalp soft tissue finding identified.  Calcified atherosclerosis at the skull base. Cerebral volume is stable and within normal limits for age. No ventriculomegaly. No midline shift, mass effect, or evidence of intracranial mass lesion. No evidence of cortically based acute infarction identified. Gray-white matter differentiation is within normal limits throughout the brain. No acute intracranial hemorrhage identified. No suspicious intracranial vascular hyperdensity.  IMPRESSION: Stable and normal for age non contrast CT appearance of the brain.   Electronically Signed   By: Lars Pinks M.D.   On: 08/26/2014 12:59   Dg Chest Port 1 View  08/26/2014   CLINICAL DATA:  Confusion.  Patient unable to give medical history.  EXAM: PORTABLE CHEST - 1 VIEW  COMPARISON:  09/01/2010  FINDINGS: Mild bilateral interstitial thickening. There is no focal parenchymal opacity, pleural effusion, or pneumothorax. The heart and mediastinal contours are unremarkable. There is evidence of prior CABG.  The osseous structures are unremarkable.  IMPRESSION: Mild bilateral  interstitial  thickening concerning for mild interstitial edema.   Electronically Signed   By: Kathreen Devoid   On: 08/26/2014 14:44      Assessment/Plan   1. Altered mental status, unclear etiology. I wonder whether he is in mild alcohol withdrawal. He is clinically and biochemically dehydrated, which could account for his altered mental status also. Alcohol level is not elevated. He does take several psychotropic medications including sedatives and medications for sleep. He is not septic clinically. We will correct his dehydration with IV fluids. We will give him thiamine and folic acid. I would consider MRI brain scan if he does not improve. 2. Alcohol abuse-I think this is playing a large part of his symptomatology at this point. He may be in mild withdrawal and we will need to monitor him closely. 3. Diabetes-sliding scale of insulin. 4. Hypertension, stable at the present time.  Further recommendations will depend on patient's hospital progress. if consultant consulted, please document name and whether formally or informally consulted  Code Status: Full code   DVT Prophylaxis: Heparin  Family Communication: I discussed the plan with the patient at the bedside.   Disposition Plan: Home when medically stable.   Time spent: 60 minutes  [facility] Pager [phone].
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
## 2014-08-26 NOTE — ED Provider Notes (Signed)
CSN: [id number]     Arrival date & time 08/26/14  1206 History  This chart was scribed for Fredia Sorrow, MD by Zola Button, ED Scribe. This patient was seen in room APA07/APA07 and the patient's care was started at 1:30 PM.     Chief Complaint  Patient presents with  . Altered Mental Status   Patient is a 73 y.o. male presenting with altered mental status. The history is provided by the spouse. No language interpreter was used.  Altered Mental Status Presenting symptoms: confusion   Most recent episode:  Yesterday Episode history:  Single Duration:  1 day Timing:  Constant Context: alcohol use   Associated symptoms: decreased appetite and rash   Associated symptoms: no vomiting    Level 5 Caveat: Altered Mental Status HPI Comments: [name] is a 73 y.o. male with a hx of hiatal hernia, CABG, ureter CA and nephrectomy brought in by EMS who presents to the Emergency Department complaining of altered mental status. Per the patient's wife, she had left the house for about 2.5 hours and returned to find the patient laying on the floor. She states that the patient had some confusion since last night, mixing up day and night. She states that the patient is an alcoholic and has not been eating food or drinking water for 4 days, with the exception of sips of water to take medications. Spouse also notes that the patient has some urinary incontinence. She states that the patient's stitches around his right eye was due to a rifle scope when he went hunting last week, and that the bruising has been getting worse. Spouse states that the patient has not been admitted to the hospital recently. She also states that the patient has hx of gangrene twice and has been dry heaving and coughing up clear phlegm.   PCP: Collene Mares, PA-C at [facility]  Past Medical History  Diagnosis Date  . Asthma   . Hypertension   . Ureter cancer 12/2001    "shut down my kidney & resulted in nephrectomy"  (07/09/2013)  . Coronary artery disease   . High cholesterol   . Heart murmur     "had rheumatic fever as a kid" (07/09/2013)  . Rheumatic fever   . Myocardial infarction 03/2002; ~ 2011  . Anginal pain   . Pneumonia     "once" (07/09/2013)  . Shortness of breath     "can happen at anytime" (07/09/2013)  . COPD (chronic obstructive pulmonary disease)     pt denies this hx on 07/09/2013  . Type II diabetes mellitus   . H/O hiatal hernia 1997  . Arthritis     "qwhere" (07/09/2013)  . Gout   . Renal disorder     "only have 1 kidney; due to go back soon to check the other one" (07/09/2013)   Past Surgical History  Procedure Laterality Date  . Nephrectomy Left 12/19/2001  . Femoral artery stent Right 07/09/2013  . Cholecystectomy  1980's  . Coronary artery bypass graft  ~ 2010    "CABG X5" (07/09/2013)  . Glaucoma surgery Bilateral ?[id number]  . Eye surgery    . Carpal tunnel release Bilateral ~2004; ~ 2011  . Trigger finger release Left ~ 1990's  . Cardiac catheterization  ~ 2010    "before the bypass" (07/09/2013)  . Below knee leg amputation Left 08/2009  . Above knee leg amputation Left 09/2009  . Lower extremity angiogram Right Nov. 4, 2014  . Lower  extremity angiogram Right 07/09/2013    Procedure: LOWER EXTREMITY ANGIOGRAM;  Surgeon: Serafina Mitchell, MD;  Location: [facility] CATH LAB;  Service: Cardiovascular;  Laterality: Right;   Family History  Problem Relation Age of Onset  . Cancer Mother   . Heart attack Father    History  Substance Use Topics  . Smoking status: Current Every Day Smoker -- 1.00 packs/day for 54 years    Types: Cigarettes  . Smokeless tobacco: Never Used  . Alcohol Use: 0.0 oz/week     Comment: 07/09/2013 "couple mixed drinks/week at most"    Review of Systems  Unable to perform ROS: Mental status change  Constitutional: Positive for decreased appetite.  Respiratory: Positive for cough.   Gastrointestinal: Negative for vomiting.  Skin: Positive for rash  and wound.  Psychiatric/Behavioral: Positive for confusion.      Allergies  Iohexol; Penicillins; and Erythromycin  Home Medications   Prior to Admission medications   Medication Sig Start Date End Date Taking? Authorizing Provider  albuterol (PROAIR HFA) 108 (90 BASE) MCG/ACT inhaler Inhale 2 puffs into the lungs every 6 (six) hours as needed for wheezing or shortness of breath.    Yes Historical Provider, MD  aspirin EC 81 MG EC tablet Take 1 tablet (81 mg total) by mouth daily. 07/10/13  Yes Regina J Roczniak, PA-C  citalopram (CELEXA) 20 MG tablet Take 20 mg by mouth daily.    Yes Historical Provider, MD  gabapentin (NEURONTIN) 100 MG capsule Take 200 mg by mouth 3 (three) times daily.    Yes Historical Provider, MD  ketoconazole (NIZORAL) 2 % cream Apply 1 application topically daily. 2 week course starting on 08/15/2014   Yes Historical Provider, MD  ketoconazole (NIZORAL) 200 MG tablet Take 200 mg by mouth daily. 2 week course starting on 08/15/2014   Yes Historical Provider, MD  metFORMIN (GLUCOPHAGE) 500 MG tablet Take 500 mg by mouth 2 (two) times daily with a meal.   Yes Historical Provider, MD  metoprolol (LOPRESSOR) 50 MG tablet Take 50 mg by mouth 2 (two) times daily.   Yes Historical Provider, MD  mupirocin ointment (BACTROBAN) 2 % Place 1 application into the nose 3 (three) times daily as needed.   Yes Historical Provider, MD  nitroGLYCERIN (NITROLINGUAL) 0.4 MG/SPRAY spray Place 1 spray under the tongue every 5 (five) minutes as needed for chest pain.   Yes Historical Provider, MD  ramipril (ALTACE) 10 MG capsule Take 10 mg by mouth every morning.   Yes Historical Provider, MD  Tamsulosin HCl (FLOMAX) 0.4 MG CAPS Take 0.4 mg by mouth 2 (two) times daily.    Yes Historical Provider, MD  triazolam (HALCION) 0.25 MG tablet Take 0.5 mg by mouth at bedtime.   Yes Historical Provider, MD  zolpidem (AMBIEN) 10 MG tablet Take 40 mg by mouth at bedtime.    Yes Historical Provider, MD   clopidogrel (PLAVIX) 75 MG tablet Take 1 tablet (75 mg total) by mouth daily with breakfast. Patient not taking: Reported on 08/26/2014 07/10/13   Nancy Nordmann Roczniak, PA-C  diazepam (VALIUM) 10 MG tablet Take 10 mg by mouth every 6 (six) hours as needed for anxiety.     Historical Provider, MD  diphenhydrAMINE (BENADRYL) 50 MG capsule Take one tablet at 7:30 AM 07/09/13 (take with last dose of Prednisone 50 mg) prior to procedure. 07/05/13   Serafina Mitchell, MD  HYDROcodone-acetaminophen (NORCO) 7.5-325 MG per tablet Take 1 tablet by mouth every 4 (four) hours as  needed for pain.     Historical Provider, MD   BP 154/100 mmHg  Pulse 88  Temp(Src) 97.8 F (36.6 C) (Oral)  Resp 18  Ht 6\' 3"  (1.905 m)  SpO2 94% Physical Exam  Constitutional: He is oriented to person, place, and time. He appears well-developed and well-nourished. No distress.  HENT:  Head: Normocephalic and atraumatic.  Mouth/Throat: No oropharyngeal exudate.  Dry mucous membranes.  Eyes: Pupils are equal, round, and reactive to light.  Neck: Neck supple.  Cardiovascular: Normal rate, regular rhythm and normal heart sounds.   No murmur heard. Pulmonary/Chest: Effort normal and breath sounds normal. No respiratory distress. He has no wheezes. He has no rales.  Abdominal: Bowel sounds are normal. There is no tenderness.  Abdominal wall hernia on left flank, 15 cm x 8 cm.   Musculoskeletal: He exhibits edema.  Above the knee amputation on the left. Swelling to right foot with hint of redness. Moving both arms.   Neurological: He is alert and oriented to person, place, and time. No cranial nerve deficit.  Skin: Skin is warm and dry. Rash noted. There is erythema.  2 cm laceration on forehead, right above nose, healing well. Bruising under both eyes. Erythematous rash on both sides of groin including scrotum, no swelling.  Psychiatric: He has a normal mood and affect. His behavior is normal.  Nursing note and vitals  reviewed.   ED Course  Procedures  DIAGNOSTIC STUDIES: Oxygen Saturation is 100% on room air, normal by my interpretation.    COORDINATION OF CARE: 1:39 PM-Discussed treatment plan which includes CT head and labs with pt at bedside and pt agreed to plan.    Labs Review Labs Reviewed  CBC - Abnormal; Notable for the following:    RBC 3.79 (*)    Hemoglobin 12.5 (*)    HCT 38.8 (*)    MCV 102.4 (*)    All other components within normal limits  COMPREHENSIVE METABOLIC PANEL - Abnormal; Notable for the following:    Sodium 133 (*)    Potassium 5.7 (*)    Creatinine, Ser 1.50 (*)    GFR calc non Af Amer 45 (*)    GFR calc Af Amer 52 (*)    All other components within normal limits  URINALYSIS, ROUTINE W REFLEX MICROSCOPIC - Abnormal; Notable for the following:    Hgb urine dipstick TRACE (*)    Ketones, ur 15 (*)    All other components within normal limits  AMMONIA - Abnormal; Notable for the following:    Ammonia 9 (*)    All other components within normal limits  CULTURE, BLOOD (ROUTINE X 2)  CULTURE, BLOOD (ROUTINE X 2)  URINE CULTURE  PROTIME-INR  ETHANOL  POTASSIUM  TROPONIN I  URINE MICROSCOPIC-ADD ON  DRUG SCREEN PANEL (SERUM)  CBG MONITORING, ED  CBG MONITORING, ED   Results for orders placed or performed during the hospital encounter of 08/26/14  CBC  Result Value Ref Range   WBC 4.6 4.0 - 10.5 K/uL   RBC 3.79 (L) 4.22 - 5.81 MIL/uL   Hemoglobin 12.5 (L) 13.0 - 17.0 g/dL   HCT 38.8 (L) 39.0 - 52.0 %   MCV 102.4 (H) 78.0 - 100.0 fL   MCH 33.0 26.0 - 34.0 pg   MCHC 32.2 30.0 - 36.0 g/dL   RDW 14.3 11.5 - 15.5 %   Platelets 197 150 - 400 K/uL  Comprehensive metabolic panel  Result Value Ref Range   Sodium  133 (L) 135 - 145 mmol/L   Potassium 5.7 (H) 3.5 - 5.1 mmol/L   Chloride 101 96 - 112 mEq/L   CO2 22 19 - 32 mmol/L   Glucose, Bld 82 70 - 99 mg/dL   BUN 21 6 - 23 mg/dL   Creatinine, Ser 1.50 (H) 0.50 - 1.35 mg/dL   Calcium 9.2 8.4 - 10.5 mg/dL    Total Protein 7.4 6.0 - 8.3 g/dL   Albumin 3.5 3.5 - 5.2 g/dL   AST 19 0 - 37 U/L   ALT 9 0 - 53 U/L   Alkaline Phosphatase 52 39 - 117 U/L   Total Bilirubin 1.0 0.3 - 1.2 mg/dL   GFR calc non Af Amer 45 (L) >90 mL/min   GFR calc Af Amer 52 (L) >90 mL/min   Anion gap 10 5 - 15  Protime-INR (if patient is on an anticoagulant)  Result Value Ref Range   Prothrombin Time 14.6 11.6 - 15.2 seconds   INR 1.13 0.00 - 1.49  Urinalysis, Routine w reflex microscopic  Result Value Ref Range   Color, Urine YELLOW YELLOW   APPearance CLEAR CLEAR   Specific Gravity, Urine 1.020 1.005 - 1.030   pH 5.5 5.0 - 8.0   Glucose, UA NEGATIVE NEGATIVE mg/dL   Hgb urine dipstick TRACE (A) NEGATIVE   Bilirubin Urine NEGATIVE NEGATIVE   Ketones, ur 15 (A) NEGATIVE mg/dL   Protein, ur NEGATIVE NEGATIVE mg/dL   Urobilinogen, UA 0.2 0.0 - 1.0 mg/dL   Nitrite NEGATIVE NEGATIVE   Leukocytes, UA NEGATIVE NEGATIVE  Ethanol  Result Value Ref Range   Alcohol, Ethyl (B) <5 0 - 9 mg/dL  Potassium  Result Value Ref Range   Potassium 5.1 3.5 - 5.1 mmol/L  Troponin I  Result Value Ref Range   Troponin I 0.03 <0.031 ng/mL  Ammonia  Result Value Ref Range   Ammonia 9 (L) 11 - 32 umol/L  Urine microscopic-add on  Result Value Ref Range   Squamous Epithelial / LPF RARE RARE   WBC, UA 0-2 <3 WBC/hpf   RBC / HPF 0-2 <3 RBC/hpf   Bacteria, UA RARE RARE  CBG monitoring, ED  Result Value Ref Range   Glucose-Capillary 75 70 - 99 mg/dL  CBG monitoring, ED  Result Value Ref Range   Glucose-Capillary 73 70 - 99 mg/dL     Imaging Review Ct Head Wo Contrast  08/26/2014   CLINICAL DATA:  73 year old male with altered mental status, found down. Initial encounter.  EXAM: CT HEAD WITHOUT CONTRAST  TECHNIQUE: Contiguous axial images were obtained from the base of the skull through the vertex without intravenous contrast.  COMPARISON:  Head CT without contrast 02/03/2012.  FINDINGS: Paranasal sinuses now are clear.  Mastoids are clear. No acute osseous abnormality identified.  Visualized orbit soft tissues are within normal limits. No acute scalp soft tissue finding identified.  Calcified atherosclerosis at the skull base. Cerebral volume is stable and within normal limits for age. No ventriculomegaly. No midline shift, mass effect, or evidence of intracranial mass lesion. No evidence of cortically based acute infarction identified. Gray-white matter differentiation is within normal limits throughout the brain. No acute intracranial hemorrhage identified. No suspicious intracranial vascular hyperdensity.  IMPRESSION: Stable and normal for age non contrast CT appearance of the brain.   Electronically Signed   By: Lars Pinks M.D.   On: 08/26/2014 12:59   Dg Chest Port 1 View  08/26/2014   CLINICAL DATA:  Confusion.  Patient unable to give medical history.  EXAM: PORTABLE CHEST - 1 VIEW  COMPARISON:  09/01/2010  FINDINGS: Mild bilateral interstitial thickening. There is no focal parenchymal opacity, pleural effusion, or pneumothorax. The heart and mediastinal contours are unremarkable. There is evidence of prior CABG.  The osseous structures are unremarkable.  IMPRESSION: Mild bilateral interstitial thickening concerning for mild interstitial edema.   Electronically Signed   By: Kathreen Devoid   On: 08/26/2014 14:44     EKG Interpretation   Date/Time:  Tuesday August 26 2014 12:09:41 EST Ventricular Rate:  88 PR Interval:  221 QRS Duration: 125 QT Interval:  382 QTC Calculation: 462 R Axis:   71 Text Interpretation:  Sinus rhythm Prolonged PR interval Nonspecific  intraventricular conduction delay Anteroseptal infarct, old Confirmed by  COOK  MD, BRIAN ([id number]) on 08/26/2014 12:16:59 PM Also confirmed by  Rogene Houston  MD, [place] [phone]  on 08/26/2014 1:59:26 PM Also confirmed by  Rogene Houston  MD, Britt Theard [phone]  on 08/26/2014 1:59:57 PM      MDM   Final diagnoses:  Confusion  Altered mental status,  unspecified altered mental status type    Patient with a significant confusion although will wake up. Workup without any specific findings. Patient also with some recent falls. Head CT negative. Ammonia was normal thought it may be elevated. Exact cause of the altered mental status is not clear. Chest x-ray without evidence of pneumonia. Patient does have an extensive history of alcoholism. It could be a component of mild nourishment here. Patient given thiamine and folate started on D5 normal saline drip. Discussed with hospitalist about admission. Patient also urinating on himself frequently. A Foley was placed urine is not consistent with infection. Blood cultures were sent. Lactic acid is pending.  I personally performed the services described in this documentation, which was scribed in my presence. The recorded information has been reviewed and is accurate.     Fredia Sorrow, MD 08/26/14 2025

## 2014-08-27 DIAGNOSIS — W19XXXA Unspecified fall, initial encounter: Secondary | ICD-10-CM

## 2014-08-27 DIAGNOSIS — E08311 Diabetes mellitus due to underlying condition with unspecified diabetic retinopathy with macular edema: Secondary | ICD-10-CM

## 2014-08-27 DIAGNOSIS — W19XXXD Unspecified fall, subsequent encounter: Secondary | ICD-10-CM

## 2014-08-27 DIAGNOSIS — F1010 Alcohol abuse, uncomplicated: Secondary | ICD-10-CM

## 2014-08-27 LAB — COMPREHENSIVE METABOLIC PANEL
ALBUMIN: 2.8 g/dL — AB (ref 3.5–5.2)
ALK PHOS: 45 U/L (ref 39–117)
ALT: 7 U/L (ref 0–53)
ANION GAP: 10 (ref 5–15)
AST: 14 U/L (ref 0–37)
BUN: 13 mg/dL (ref 6–23)
CALCIUM: 8.4 mg/dL (ref 8.4–10.5)
CO2: 22 mmol/L (ref 19–32)
Chloride: 105 mEq/L (ref 96–112)
Creatinine, Ser: 1.02 mg/dL (ref 0.50–1.35)
GFR calc non Af Amer: 72 mL/min — ABNORMAL LOW (ref >90)
GFR, EST AFRICAN AMERICAN: 83 mL/min — AB (ref >90)
GLUCOSE: 74 mg/dL (ref 70–99)
Potassium: 4.8 mmol/L (ref 3.5–5.1)
Sodium: 137 mmol/L (ref 135–145)
TOTAL PROTEIN: 6.1 g/dL (ref 6.0–8.3)
Total Bilirubin: 0.9 mg/dL (ref 0.3–1.2)

## 2014-08-27 LAB — CBC
HEMATOCRIT: 34.9 % — AB (ref 39.0–52.0)
Hemoglobin: 11.5 g/dL — ABNORMAL LOW (ref 13.0–17.0)
MCH: 33.7 pg (ref 26.0–34.0)
MCHC: 33 g/dL (ref 30.0–36.0)
MCV: 102.3 fL — ABNORMAL HIGH (ref 78.0–100.0)
Platelets: 222 10*3/uL (ref 150–400)
RBC: 3.41 MIL/uL — AB (ref 4.22–5.81)
RDW: 14.4 % (ref 11.5–15.5)
WBC: 4.4 10*3/uL (ref 4.0–10.5)

## 2014-08-27 LAB — GLUCOSE, CAPILLARY
Glucose-Capillary: 66 mg/dL — ABNORMAL LOW (ref 70–99)
Glucose-Capillary: 102 mg/dL — ABNORMAL HIGH (ref 70–99)
Glucose-Capillary: 126 mg/dL — ABNORMAL HIGH (ref 70–99)
Glucose-Capillary: 74 mg/dL (ref 70–99)

## 2014-08-27 LAB — VITAMIN B12: VITAMIN B 12: 355 pg/mL (ref 211–911)

## 2014-08-27 LAB — PROTIME-INR
INR: 1.08 (ref 0.00–1.49)
PROTHROMBIN TIME: 14.1 s (ref 11.6–15.2)

## 2014-08-27 LAB — TSH: TSH: 2.213 u[IU]/mL (ref 0.350–4.500)

## 2014-08-27 MED ORDER — FOLIC ACID 1 MG PO TABS: 1.0000 mg | ORAL_TABLET | Freq: Every day | ORAL | Status: DC

## 2014-08-27 MED ORDER — THIAMINE HCL 100 MG PO TABS: 100.0000 mg | ORAL_TABLET | Freq: Every day | ORAL | Status: DC

## 2014-08-27 MED ORDER — NITROGLYCERIN 0.4 MG SL SUBL: 0.4000 mg | SUBLINGUAL_TABLET | SUBLINGUAL | Status: DC | PRN

## 2014-08-27 NOTE — Discharge Summary (Signed)
Physician Discharge Summary  [name] QIW:[id number] DOB: May 06, 1941 DOA: 08/26/2014  PCP: Leonides Grills, MD  Admit date: 08/26/2014 Discharge date: 08/27/2014  Time spent: 45 minutes  Recommendations for Outpatient Follow-up:  -Will be discharged home today. -Advised to follow up with PCP in 2 weeks.   Discharge Diagnoses:  Active Problems:   Diabetes   Altered mental status   Dehydration   Alcohol abuse   Hypertension   Fall   Discharge Condition: Stable and improved  Filed Weights   08/26/14 2202  Weight: 89.721 kg (197 lb 12.8 oz)    History of present illness:  This is a 73 year old man who presents with history of altered mental status. The wife, who was present earlier and gave the history to the emergency room physician, said that she had left the house for approximately 2-1/2 hours and returned to find the patient laying on the floor. The patient has been confused since last night. According to the wife, the patient has a history of alcohol abuse and has not been eating food or drinking water for the last 4 days, except with the exception of sips of water to take his medications. He also has had urinary incontinence. He has had some dry heaving. He denies any abdominal pain. The patient himself tells me he feels fine now, apparently felt more unwell earlier. He cannot specify. 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 The patient has a history of diabetes on only one oral hypoglycemic agent. The patient cannot tell me when he last drink alcohol. He says he drinks vodka 4-5 drinks, maybe every other day, however I suspect this may be more frequent. Evaluation in the emergency room shows that his creatinine is increased. CT brain scan is unremarkable. He is now being admitted for further investigation and management.  Hospital Course:   Fall -Mechanical fall from wheelchair. -denies LOC. -Has a frontal laceration that has  been repaired in the ED.  Acute Encephalopathy -Resolved. -Was apparently confused on arrival to the ED. -? Related to dehydration or ETOH use. -Is not confused at present and is able to describe to me in detail the mechanics of his fall yesterday. -No focal deficits; see no need for MRI at present.  ETOH Abuse -No signs of withdrawal. -Patient denies ETOH use recently.  HTN -Elevated. -Continue home meds and follow up with PCP as an OP.  DM -Well controlled.    Procedures:  None   Consultations:  None  Discharge Instructions      Discharge Instructions    Increase activity slowly    Complete by:  As directed             Medication List    STOP taking these medications        clopidogrel 75 MG tablet  Commonly known as:  PLAVIX      TAKE these medications        aspirin 81 MG EC tablet  Take 1 tablet (81 mg total) by mouth daily.     citalopram 20 MG tablet  Commonly known as:  CELEXA  Take 20 mg by mouth daily.     diazepam 10 MG tablet  Commonly known as:  VALIUM  Take 10 mg by mouth every 6 (six) hours as needed for anxiety.     diphenhydrAMINE 50 MG capsule  Commonly known as:  BENADRYL  Take one tablet at 7:30 AM 07/09/13 (take with last  dose of Prednisone 50 mg) prior to procedure.     folic acid 1 MG tablet  Commonly known as:  FOLVITE  Take 1 tablet (1 mg total) by mouth daily.     gabapentin 100 MG capsule  Commonly known as:  NEURONTIN  Take 200 mg by mouth 3 (three) times daily.     HYDROcodone-acetaminophen 7.5-325 MG per tablet  Commonly known as:  NORCO  Take 1 tablet by mouth every 4 (four) hours as needed for pain.     ketoconazole 2 % cream  Commonly known as:  NIZORAL  Apply 1 application topically daily. 2 week course starting on 08/15/2014     ketoconazole 200 MG tablet  Commonly known as:  NIZORAL  Take 200 mg by mouth daily. 2 week course starting on 08/15/2014     metFORMIN 500 MG tablet  Commonly known as:   GLUCOPHAGE  Take 500 mg by mouth 2 (two) times daily with a meal.     metoprolol 50 MG tablet  Commonly known as:  LOPRESSOR  Take 50 mg by mouth 2 (two) times daily.     mupirocin ointment 2 %  Commonly known as:  BACTROBAN  Place 1 application into the nose 3 (three) times daily as needed.     nitroGLYCERIN 0.4 MG/SPRAY spray  Commonly known as:  NITROLINGUAL  Place 1 spray under the tongue every 5 (five) minutes as needed for chest pain.     PROAIR HFA 108 (90 BASE) MCG/ACT inhaler  Generic drug:  albuterol  Inhale 2 puffs into the lungs every 6 (six) hours as needed for wheezing or shortness of breath.     ramipril 10 MG capsule  Commonly known as:  ALTACE  Take 10 mg by mouth every morning.     tamsulosin 0.4 MG Caps capsule  Commonly known as:  FLOMAX  Take 0.4 mg by mouth 2 (two) times daily.     thiamine 100 MG tablet  Take 1 tablet (100 mg total) by mouth daily.     triazolam 0.25 MG tablet  Commonly known as:  HALCION  Take 0.5 mg by mouth at bedtime.     zolpidem 10 MG tablet  Commonly known as:  AMBIEN  Take 40 mg by mouth at bedtime.       Allergies  Allergen Reactions  . Iohexol      Code: HIVES, Desc: PT REPORTS THROAT SWELLING, HIVES AND ITCHING, 13 HR PRE-MED GIVEN AND SUCCESSFUL-ARS 07/15/07   . Penicillins Itching and Swelling    Throat swelling  . Erythromycin Swelling and Rash    Throat swelling   Follow-up Information    Follow up with Leonides Grills, MD. Schedule an appointment as soon as possible for a visit in 2 weeks.   Specialty:  Family Medicine   Contact information:   [address] [place] [address] [phone]        The results of significant diagnostics from this hospitalization (including imaging, microbiology, ancillary and laboratory) are listed below for reference.    Significant Diagnostic Studies: Ct Head Wo Contrast  08/26/2014   CLINICAL DATA:  73 year old male with altered mental status, found  down. Initial encounter.  EXAM: CT HEAD WITHOUT CONTRAST  TECHNIQUE: Contiguous axial images were obtained from the base of the skull through the vertex without intravenous contrast.  COMPARISON:  Head CT without contrast 02/03/2012.  FINDINGS: Paranasal sinuses now are clear. Mastoids are clear. No acute osseous abnormality identified.  Visualized orbit  soft tissues are within normal limits. No acute scalp soft tissue finding identified.  Calcified atherosclerosis at the skull base. Cerebral volume is stable and within normal limits for age. No ventriculomegaly. No midline shift, mass effect, or evidence of intracranial mass lesion. No evidence of cortically based acute infarction identified. Gray-white matter differentiation is within normal limits throughout the brain. No acute intracranial hemorrhage identified. No suspicious intracranial vascular hyperdensity.  IMPRESSION: Stable and normal for age non contrast CT appearance of the brain.   Electronically Signed   By: Lars Pinks M.D.   On: 08/26/2014 12:59   Dg Chest Port 1 View  08/26/2014   CLINICAL DATA:  Confusion.  Patient unable to give medical history.  EXAM: PORTABLE CHEST - 1 VIEW  COMPARISON:  09/01/2010  FINDINGS: Mild bilateral interstitial thickening. There is no focal parenchymal opacity, pleural effusion, or pneumothorax. The heart and mediastinal contours are unremarkable. There is evidence of prior CABG.  The osseous structures are unremarkable.  IMPRESSION: Mild bilateral interstitial thickening concerning for mild interstitial edema.   Electronically Signed   By: Kathreen Devoid   On: 08/26/2014 14:44    Microbiology: Recent Results (from the past 240 hour(s))  Culture, blood (routine x 2)     Status: None (Preliminary result)   Collection Time: 08/26/14  1:57 PM  Result Value Ref Range Status   Specimen Description BLOOD LEFT ANTECUBITAL  Final   Special Requests BOTTLES DRAWN AEROBIC AND ANAEROBIC 8CC  Final   Culture NO GROWTH 1  DAY  Final   Report Status PENDING  Incomplete  Culture, blood (routine x 2)     Status: None (Preliminary result)   Collection Time: 08/26/14  2:11 PM  Result Value Ref Range Status   Specimen Description BLOOD LEFT HAND  Final   Special Requests BOTTLES DRAWN AEROBIC AND ANAEROBIC 6CC  Final   Culture NO GROWTH 1 DAY  Final   Report Status PENDING  Incomplete     Labs: Basic Metabolic Panel:  Recent Labs Lab 08/26/14 1222 08/26/14 1357 08/27/14 0604  NA 133*  --  137  K 5.7* 5.1 4.8  CL 101  --  105  CO2 22  --  22  GLUCOSE 82  --  74  BUN 21  --  13  CREATININE 1.50*  --  1.02  CALCIUM 9.2  --  8.4   Liver Function Tests:  Recent Labs Lab 08/26/14 1222 08/27/14 0604  AST 19 14  ALT 9 7  ALKPHOS 52 45  BILITOT 1.0 0.9  PROT 7.4 6.1  ALBUMIN 3.5 2.8*   No results for input(s): LIPASE, AMYLASE in the last 168 hours.  Recent Labs Lab 08/26/14 1357  AMMONIA 9*   CBC:  Recent Labs Lab 08/26/14 1222 08/27/14 0604  WBC 4.6 4.4  HGB 12.5* 11.5*  HCT 38.8* 34.9*  MCV 102.4* 102.3*  PLT 197 222   Cardiac Enzymes:  Recent Labs Lab 08/26/14 1357  TROPONINI 0.03   BNP: BNP (last 3 results) No results for input(s): PROBNP in the last 8760 hours. CBG:  Recent Labs Lab 08/26/14 2205 08/27/14 0720 08/27/14 0807 08/27/14 1138 08/27/14 1610  GLUCAP 107* 66* 102* 126* 74       Signed:  HERNANDEZ ACOSTA,ESTELA  [facility] Pager: [phone] 08/27/2014, 5:03 PM

## 2014-08-27 NOTE — Progress Notes (Signed)
Discharge instruction reviewed with patient's wife. No distress noted. IV and foley catheter removed. Patient dressed and placed in the wheelchair. Patient taken to lobby by nurse tech.

## 2014-08-27 NOTE — Plan of Care (Signed)
Problem: Phase I Progression Outcomes Goal: Voiding-avoid urinary catheter unless indicated Outcome: Not Met (add Reason) Foley catheter placed in ED

## 2014-08-27 NOTE — Care Management Note (Signed)
    Page 1 of 1   08/27/2014     4:17:43 PM CARE MANAGEMENT NOTE 08/27/2014  Patient:  [name], [name]   Account Number:  [id number]  Date Initiated:  08/27/2014  Documentation initiated by:  Vladimir Creeks  Subjective/Objective Assessment:   Admitted with AMS. Pt is from home, with spouse and is usually independent. He is alert and oriented  when CM spoke with him and states he is being D/C. Says he wants to be home for the holidays. They are raising their 73 yr old     Action/Plan:   granddaughter and he needs to be there. No needs identified.   Anticipated DC Date:  08/27/2014   Anticipated DC Plan:  [place]  CM consult      Choice offered to / List presented to:             Status of service:  Completed, signed off Medicare Important Message given?   (If response is "NO", the following Medicare IM given date fields will be blank) Date Medicare IM given:   Medicare IM given by:   Date Additional Medicare IM given:   Additional Medicare IM given by:    Discharge Disposition:    Per UR Regulation:  Reviewed for med. necessity/level of care/duration of stay  If discussed at [place] of Stay Meetings, dates discussed:    Comments:  08/27/14 [place] RN/CM

## 2014-08-27 NOTE — Care Management Utilization Note (Signed)
UR completed 

## 2014-08-28 LAB — FOLATE RBC: RBC FOLATE: 748 ng/mL — AB (ref >280)

## 2014-08-28 LAB — URINE CULTURE
Colony Count: NO GROWTH
Culture: NO GROWTH

## 2014-08-31 LAB — CULTURE, BLOOD (ROUTINE X 2)
CULTURE: NO GROWTH
Culture: NO GROWTH

## 2014-08-31 LAB — DRUG SCREEN PANEL (SERUM)

## 2014-09-06 DIAGNOSIS — R69 Illness, unspecified: Secondary | ICD-10-CM | POA: Diagnosis not present

## 2014-09-07 DIAGNOSIS — T1490 Injury, unspecified: Secondary | ICD-10-CM | POA: Diagnosis not present

## 2014-09-08 DIAGNOSIS — T1490 Injury, unspecified: Secondary | ICD-10-CM | POA: Diagnosis not present

## 2014-09-09 ENCOUNTER — Emergency Department (HOSPITAL_COMMUNITY): Payer: Medicare Other

## 2014-09-09 ENCOUNTER — Encounter (HOSPITAL_COMMUNITY): Payer: Self-pay | Admitting: Emergency Medicine

## 2014-09-09 ENCOUNTER — Inpatient Hospital Stay (HOSPITAL_COMMUNITY)
Admission: EM | Admit: 2014-09-09 | Discharge: 2014-09-15 | DRG: 682 | Disposition: A | Discharge status: Skilled Nursing Facility | Payer: Medicare Other | Attending: Internal Medicine | Admitting: Internal Medicine

## 2014-09-09 DIAGNOSIS — W07XXXD Fall from chair, subsequent encounter: Secondary | ICD-10-CM | POA: Diagnosis not present

## 2014-09-09 DIAGNOSIS — W19XXXA Unspecified fall, initial encounter: Secondary | ICD-10-CM | POA: Diagnosis present

## 2014-09-09 DIAGNOSIS — R1310 Dysphagia, unspecified: Secondary | ICD-10-CM | POA: Diagnosis present

## 2014-09-09 DIAGNOSIS — Y92019 Unspecified place in single-family (private) house as the place of occurrence of the external cause: Secondary | ICD-10-CM

## 2014-09-09 DIAGNOSIS — R0989 Other specified symptoms and signs involving the circulatory and respiratory systems: Secondary | ICD-10-CM

## 2014-09-09 DIAGNOSIS — M75112 Incomplete rotator cuff tear or rupture of left shoulder, not specified as traumatic: Secondary | ICD-10-CM | POA: Diagnosis not present

## 2014-09-09 DIAGNOSIS — Z809 Family history of malignant neoplasm, unspecified: Secondary | ICD-10-CM

## 2014-09-09 DIAGNOSIS — S0990XA Unspecified injury of head, initial encounter: Secondary | ICD-10-CM | POA: Diagnosis not present

## 2014-09-09 DIAGNOSIS — E874 Mixed disorder of acid-base balance: Secondary | ICD-10-CM | POA: Diagnosis present

## 2014-09-09 DIAGNOSIS — E872 Acidosis: Secondary | ICD-10-CM

## 2014-09-09 DIAGNOSIS — M25512 Pain in left shoulder: Secondary | ICD-10-CM | POA: Diagnosis present

## 2014-09-09 DIAGNOSIS — Z8249 Family history of ischemic heart disease and other diseases of the circulatory system: Secondary | ICD-10-CM | POA: Diagnosis not present

## 2014-09-09 DIAGNOSIS — I10 Essential (primary) hypertension: Secondary | ICD-10-CM | POA: Diagnosis present

## 2014-09-09 DIAGNOSIS — F1027 Alcohol dependence with alcohol-induced persisting dementia: Secondary | ICD-10-CM | POA: Diagnosis present

## 2014-09-09 DIAGNOSIS — E43 Unspecified severe protein-calorie malnutrition: Secondary | ICD-10-CM | POA: Diagnosis present

## 2014-09-09 DIAGNOSIS — F17210 Nicotine dependence, cigarettes, uncomplicated: Secondary | ICD-10-CM | POA: Diagnosis present

## 2014-09-09 DIAGNOSIS — G9340 Encephalopathy, unspecified: Secondary | ICD-10-CM | POA: Diagnosis not present

## 2014-09-09 DIAGNOSIS — F0391 Unspecified dementia with behavioral disturbance: Secondary | ICD-10-CM | POA: Diagnosis present

## 2014-09-09 DIAGNOSIS — M75120 Complete rotator cuff tear or rupture of unspecified shoulder, not specified as traumatic: Secondary | ICD-10-CM | POA: Diagnosis present

## 2014-09-09 DIAGNOSIS — J45909 Unspecified asthma, uncomplicated: Secondary | ICD-10-CM | POA: Diagnosis present

## 2014-09-09 DIAGNOSIS — G8929 Other chronic pain: Secondary | ICD-10-CM | POA: Diagnosis present

## 2014-09-09 DIAGNOSIS — Z89512 Acquired absence of left leg below knee: Secondary | ICD-10-CM | POA: Diagnosis not present

## 2014-09-09 DIAGNOSIS — N390 Urinary tract infection, site not specified: Secondary | ICD-10-CM | POA: Diagnosis not present

## 2014-09-09 DIAGNOSIS — Z905 Acquired absence of kidney: Secondary | ICD-10-CM | POA: Diagnosis present

## 2014-09-09 DIAGNOSIS — Y900 Blood alcohol level of less than 20 mg/100 ml: Secondary | ICD-10-CM | POA: Diagnosis present

## 2014-09-09 DIAGNOSIS — Z6825 Body mass index (BMI) 25.0-25.9, adult: Secondary | ICD-10-CM | POA: Diagnosis not present

## 2014-09-09 DIAGNOSIS — M75102 Unspecified rotator cuff tear or rupture of left shoulder, not specified as traumatic: Secondary | ICD-10-CM | POA: Diagnosis present

## 2014-09-09 DIAGNOSIS — K5900 Constipation, unspecified: Secondary | ICD-10-CM | POA: Diagnosis present

## 2014-09-09 DIAGNOSIS — J9600 Acute respiratory failure, unspecified whether with hypoxia or hypercapnia: Secondary | ICD-10-CM | POA: Diagnosis present

## 2014-09-09 DIAGNOSIS — R52 Pain, unspecified: Secondary | ICD-10-CM | POA: Diagnosis not present

## 2014-09-09 DIAGNOSIS — F418 Other specified anxiety disorders: Secondary | ICD-10-CM | POA: Diagnosis present

## 2014-09-09 DIAGNOSIS — Z8554 Personal history of malignant neoplasm of ureter: Secondary | ICD-10-CM

## 2014-09-09 DIAGNOSIS — I252 Old myocardial infarction: Secondary | ICD-10-CM | POA: Diagnosis not present

## 2014-09-09 DIAGNOSIS — E1151 Type 2 diabetes mellitus with diabetic peripheral angiopathy without gangrene: Secondary | ICD-10-CM | POA: Diagnosis present

## 2014-09-09 DIAGNOSIS — M19012 Primary osteoarthritis, left shoulder: Secondary | ICD-10-CM | POA: Diagnosis not present

## 2014-09-09 DIAGNOSIS — E559 Vitamin D deficiency, unspecified: Secondary | ICD-10-CM | POA: Diagnosis not present

## 2014-09-09 DIAGNOSIS — I739 Peripheral vascular disease, unspecified: Secondary | ICD-10-CM | POA: Diagnosis present

## 2014-09-09 DIAGNOSIS — G9341 Metabolic encephalopathy: Secondary | ICD-10-CM | POA: Diagnosis present

## 2014-09-09 DIAGNOSIS — I2510 Atherosclerotic heart disease of native coronary artery without angina pectoris: Secondary | ICD-10-CM | POA: Diagnosis present

## 2014-09-09 DIAGNOSIS — J9602 Acute respiratory failure with hypercapnia: Secondary | ICD-10-CM | POA: Diagnosis present

## 2014-09-09 DIAGNOSIS — N179 Acute kidney failure, unspecified: Principal | ICD-10-CM | POA: Diagnosis present

## 2014-09-09 DIAGNOSIS — R296 Repeated falls: Secondary | ICD-10-CM

## 2014-09-09 DIAGNOSIS — Z87891 Personal history of nicotine dependence: Secondary | ICD-10-CM | POA: Diagnosis not present

## 2014-09-09 DIAGNOSIS — R627 Adult failure to thrive: Secondary | ICD-10-CM | POA: Diagnosis present

## 2014-09-09 DIAGNOSIS — R8299 Other abnormal findings in urine: Secondary | ICD-10-CM | POA: Diagnosis not present

## 2014-09-09 DIAGNOSIS — M549 Dorsalgia, unspecified: Secondary | ICD-10-CM | POA: Diagnosis present

## 2014-09-09 DIAGNOSIS — E780 Pure hypercholesterolemia: Secondary | ICD-10-CM | POA: Diagnosis present

## 2014-09-09 DIAGNOSIS — M1990 Unspecified osteoarthritis, unspecified site: Secondary | ICD-10-CM | POA: Diagnosis present

## 2014-09-09 DIAGNOSIS — E8729 Other acidosis: Secondary | ICD-10-CM

## 2014-09-09 DIAGNOSIS — J449 Chronic obstructive pulmonary disease, unspecified: Secondary | ICD-10-CM | POA: Diagnosis present

## 2014-09-09 DIAGNOSIS — S199XXA Unspecified injury of neck, initial encounter: Secondary | ICD-10-CM | POA: Diagnosis not present

## 2014-09-09 DIAGNOSIS — E1140 Type 2 diabetes mellitus with diabetic neuropathy, unspecified: Secondary | ICD-10-CM | POA: Diagnosis present

## 2014-09-09 DIAGNOSIS — Z951 Presence of aortocoronary bypass graft: Secondary | ICD-10-CM | POA: Diagnosis not present

## 2014-09-09 DIAGNOSIS — Z7982 Long term (current) use of aspirin: Secondary | ICD-10-CM | POA: Diagnosis not present

## 2014-09-09 DIAGNOSIS — N400 Enlarged prostate without lower urinary tract symptoms: Secondary | ICD-10-CM | POA: Diagnosis present

## 2014-09-09 DIAGNOSIS — I959 Hypotension, unspecified: Secondary | ICD-10-CM | POA: Insufficient documentation

## 2014-09-09 DIAGNOSIS — I25119 Atherosclerotic heart disease of native coronary artery with unspecified angina pectoris: Secondary | ICD-10-CM | POA: Diagnosis present

## 2014-09-09 DIAGNOSIS — D539 Nutritional anemia, unspecified: Secondary | ICD-10-CM | POA: Diagnosis present

## 2014-09-09 DIAGNOSIS — F1097 Alcohol use, unspecified with alcohol-induced persisting dementia: Secondary | ICD-10-CM | POA: Diagnosis present

## 2014-09-09 DIAGNOSIS — Z79891 Long term (current) use of opiate analgesic: Secondary | ICD-10-CM | POA: Diagnosis not present

## 2014-09-09 DIAGNOSIS — R079 Chest pain, unspecified: Secondary | ICD-10-CM | POA: Diagnosis not present

## 2014-09-09 DIAGNOSIS — E861 Hypovolemia: Secondary | ICD-10-CM | POA: Diagnosis present

## 2014-09-09 DIAGNOSIS — T50904A Poisoning by unspecified drugs, medicaments and biological substances, undetermined, initial encounter: Secondary | ICD-10-CM | POA: Diagnosis not present

## 2014-09-09 DIAGNOSIS — J811 Chronic pulmonary edema: Secondary | ICD-10-CM | POA: Diagnosis not present

## 2014-09-09 DIAGNOSIS — E46 Unspecified protein-calorie malnutrition: Secondary | ICD-10-CM | POA: Diagnosis present

## 2014-09-09 DIAGNOSIS — R4182 Altered mental status, unspecified: Secondary | ICD-10-CM | POA: Diagnosis present

## 2014-09-09 DIAGNOSIS — W19XXXD Unspecified fall, subsequent encounter: Secondary | ICD-10-CM

## 2014-09-09 DIAGNOSIS — Z7401 Bed confinement status: Secondary | ICD-10-CM | POA: Diagnosis not present

## 2014-09-09 DIAGNOSIS — R279 Unspecified lack of coordination: Secondary | ICD-10-CM | POA: Diagnosis not present

## 2014-09-09 HISTORY — DX: Complete rotator cuff tear or rupture of unspecified shoulder, not specified as traumatic: M75.120

## 2014-09-09 LAB — MRSA PCR SCREENING: MRSA by PCR: NEGATIVE

## 2014-09-09 LAB — CBC WITH DIFFERENTIAL/PLATELET
Basophils Absolute: 0 10*3/uL (ref 0.0–0.1)
Basophils Relative: 0 % (ref 0–1)
EOS ABS: 0 10*3/uL (ref 0.0–0.7)
Eosinophils Relative: 1 % (ref 0–5)
HCT: 35.5 % — ABNORMAL LOW (ref 39.0–52.0)
Hemoglobin: 11.2 g/dL — ABNORMAL LOW (ref 13.0–17.0)
LYMPHS ABS: 0.8 10*3/uL (ref 0.7–4.0)
Lymphocytes Relative: 13 % (ref 12–46)
MCH: 32.7 pg (ref 26.0–34.0)
MCHC: 31.5 g/dL (ref 30.0–36.0)
MCV: 103.8 fL — ABNORMAL HIGH (ref 78.0–100.0)
Monocytes Absolute: 0.4 10*3/uL (ref 0.1–1.0)
Monocytes Relative: 7 % (ref 3–12)
NEUTROS PCT: 79 % — AB (ref 43–77)
Neutro Abs: 5.1 10*3/uL (ref 1.7–7.7)
PLATELETS: 330 10*3/uL (ref 150–400)
RBC: 3.42 MIL/uL — AB (ref 4.22–5.81)
RDW: 14.6 % (ref 11.5–15.5)
WBC: 6.4 10*3/uL (ref 4.0–10.5)

## 2014-09-09 LAB — PROTIME-INR
INR: 1.15 (ref 0.00–1.49)
Prothrombin Time: 14.8 seconds (ref 11.6–15.2)

## 2014-09-09 LAB — BLOOD GAS, ARTERIAL
Acid-Base Excess: 7.7 mmol/L — ABNORMAL HIGH (ref 0.0–2.0)
Acid-base deficit: 8.7 mmol/L — ABNORMAL HIGH (ref 0.0–2.0)
Bicarbonate: 19.2 mEq/L — ABNORMAL LOW (ref 20.0–24.0)
Drawn by: 213101
O2 Content: 2 L/min
O2 Saturation: 94.8 %
PO2 ART: 87.3 mmHg (ref 80.0–100.0)
TCO2: 18.4 mmol/L (ref 0–100)
pCO2 arterial: 52.5 mmHg — ABNORMAL HIGH (ref 35.0–45.0)
pH, Arterial: 7.188 — CL (ref 7.350–7.450)

## 2014-09-09 LAB — COMPREHENSIVE METABOLIC PANEL
ALK PHOS: 60 U/L (ref 39–117)
ALT: 11 U/L (ref 0–53)
ANION GAP: 9 (ref 5–15)
AST: 16 U/L (ref 0–37)
Albumin: 2.8 g/dL — ABNORMAL LOW (ref 3.5–5.2)
BILIRUBIN TOTAL: 0.6 mg/dL (ref 0.3–1.2)
BUN: 34 mg/dL — AB (ref 6–23)
CO2: 21 mmol/L (ref 19–32)
Calcium: 8.4 mg/dL (ref 8.4–10.5)
Chloride: 104 mEq/L (ref 96–112)
Creatinine, Ser: 2.91 mg/dL — ABNORMAL HIGH (ref 0.50–1.35)
GFR calc Af Amer: 23 mL/min — ABNORMAL LOW (ref >90)
GFR calc non Af Amer: 20 mL/min — ABNORMAL LOW (ref >90)
Glucose, Bld: 93 mg/dL (ref 70–99)
POTASSIUM: 5 mmol/L (ref 3.5–5.1)
Sodium: 134 mmol/L — ABNORMAL LOW (ref 135–145)
Total Protein: 6.7 g/dL (ref 6.0–8.3)

## 2014-09-09 LAB — AMMONIA: AMMONIA: 19 umol/L (ref 11–32)

## 2014-09-09 LAB — I-STAT CG4 LACTIC ACID, ED: LACTIC ACID, VENOUS: 0.72 mmol/L (ref 0.5–2.2)

## 2014-09-09 LAB — CBG MONITORING, ED: GLUCOSE-CAPILLARY: 87 mg/dL (ref 70–99)

## 2014-09-09 LAB — APTT: aPTT: 38 seconds — ABNORMAL HIGH (ref 24–37)

## 2014-09-09 LAB — TROPONIN I
TROPONIN I: 0.03 ng/mL (ref <0.031)
Troponin I: 0.03 ng/mL (ref <0.031)

## 2014-09-09 LAB — ETHANOL: Alcohol, Ethyl (B): <5 mg/dL (ref 0–9)

## 2014-09-09 MED ORDER — VITAMIN B-1 100 MG PO TABS
100.0000 mg | ORAL_TABLET | Freq: Every day | ORAL | Status: DC
  Administered 2014-09-10 – 2014-09-15 (×6): 100 mg via ORAL
  Filled 2014-09-09 (×6): qty 1

## 2014-09-09 MED ORDER — NALOXONE HCL 1 MG/ML IJ SOLN
1.0000 mg | Freq: Once | INTRAMUSCULAR | Status: AC
  Administered 2014-09-09: 1 mg via INTRAVENOUS
  Filled 2014-09-09: qty 2

## 2014-09-09 MED ORDER — ASPIRIN EC 81 MG PO TBEC
81.0000 mg | DELAYED_RELEASE_TABLET | Freq: Every day | ORAL | Status: DC
  Administered 2014-09-10 – 2014-09-15 (×6): 81 mg via ORAL
  Filled 2014-09-09 (×6): qty 1

## 2014-09-09 MED ORDER — ENOXAPARIN SODIUM 30 MG/0.3ML ~~LOC~~ SOLN
30.0000 mg | SUBCUTANEOUS | Status: DC
  Administered 2014-09-10: 30 mg via SUBCUTANEOUS
  Filled 2014-09-09: qty 0.3

## 2014-09-09 MED ORDER — SODIUM CHLORIDE 0.9 % IV SOLN
INTRAVENOUS | Status: DC
  Administered 2014-09-10: 01:00:00 via INTRAVENOUS

## 2014-09-09 MED ORDER — ALBUTEROL SULFATE (2.5 MG/3ML) 0.083% IN NEBU: 3.0000 mL | INHALATION_SOLUTION | Freq: Four times a day (QID) | RESPIRATORY_TRACT | Status: DC | PRN

## 2014-09-09 MED ORDER — HYDROCODONE-ACETAMINOPHEN 7.5-325 MG PO TABS
1.0000 | ORAL_TABLET | ORAL | Status: DC | PRN
  Administered 2014-09-10 – 2014-09-13 (×4): 1 via ORAL
  Filled 2014-09-09 (×6): qty 1

## 2014-09-09 MED ORDER — SODIUM CHLORIDE 0.9 % IV SOLN: INTRAVENOUS | Status: DC

## 2014-09-09 MED ORDER — GABAPENTIN 100 MG PO CAPS
200.0000 mg | ORAL_CAPSULE | Freq: Three times a day (TID) | ORAL | Status: DC
  Administered 2014-09-10 – 2014-09-15 (×16): 200 mg via ORAL
  Filled 2014-09-09 (×16): qty 2

## 2014-09-09 MED ORDER — CITALOPRAM HYDROBROMIDE 20 MG PO TABS
20.0000 mg | ORAL_TABLET | Freq: Every day | ORAL | Status: DC
  Administered 2014-09-10 – 2014-09-15 (×6): 20 mg via ORAL
  Filled 2014-09-09 (×6): qty 1

## 2014-09-09 MED ORDER — SODIUM CHLORIDE 0.9 % IV SOLN
INTRAVENOUS | Status: DC
  Administered 2014-09-09 – 2014-09-10 (×3): via INTRAVENOUS
  Administered 2014-09-11: 1000 mL via INTRAVENOUS
  Administered 2014-09-12: 05:00:00 via INTRAVENOUS

## 2014-09-09 MED ORDER — FOLIC ACID 1 MG PO TABS
1.0000 mg | ORAL_TABLET | Freq: Every day | ORAL | Status: DC
  Administered 2014-09-10: 1 mg via ORAL
  Filled 2014-09-09: qty 1

## 2014-09-09 MED ORDER — ACETAMINOPHEN 325 MG PO TABS: 650.0000 mg | ORAL_TABLET | Freq: Four times a day (QID) | ORAL | Status: DC | PRN

## 2014-09-09 MED ORDER — ZOLPIDEM TARTRATE 5 MG PO TABS
10.0000 mg | ORAL_TABLET | Freq: Every day | ORAL | Status: DC
  Administered 2014-09-10 – 2014-09-14 (×5): 10 mg via ORAL
  Filled 2014-09-09 (×5): qty 2

## 2014-09-09 MED ORDER — DIAZEPAM 5 MG PO TABS
10.0000 mg | ORAL_TABLET | Freq: Four times a day (QID) | ORAL | Status: DC | PRN
  Administered 2014-09-10 – 2014-09-11 (×4): 10 mg via ORAL
  Filled 2014-09-09 (×4): qty 2

## 2014-09-09 MED ORDER — ACETAMINOPHEN 650 MG RE SUPP: 650.0000 mg | Freq: Four times a day (QID) | RECTAL | Status: DC | PRN

## 2014-09-09 MED ORDER — TRIAZOLAM 0.125 MG PO TABS: 0.5000 mg | ORAL_TABLET | Freq: Every day | ORAL | Status: DC

## 2014-09-09 MED ORDER — SODIUM CHLORIDE 0.9 % IV SOLN
1000.0000 mL | INTRAVENOUS | Status: DC
  Administered 2014-09-09: 1000 mL via INTRAVENOUS

## 2014-09-09 MED ORDER — METOPROLOL TARTRATE 50 MG PO TABS
50.0000 mg | ORAL_TABLET | Freq: Two times a day (BID) | ORAL | Status: DC
  Administered 2014-09-10 – 2014-09-15 (×12): 50 mg via ORAL
  Filled 2014-09-09 (×12): qty 1

## 2014-09-09 MED ORDER — NITROGLYCERIN 0.4 MG/SPRAY TL SOLN
1.0000 | Status: DC | PRN
  Filled 2014-09-09: qty 4.9

## 2014-09-09 MED ORDER — TAMSULOSIN HCL 0.4 MG PO CAPS
0.4000 mg | ORAL_CAPSULE | Freq: Two times a day (BID) | ORAL | Status: DC
  Administered 2014-09-10 – 2014-09-15 (×11): 0.4 mg via ORAL
  Filled 2014-09-09 (×11): qty 1

## 2014-09-09 MED ORDER — SODIUM CHLORIDE 0.9 % IJ SOLN
3.0000 mL | Freq: Two times a day (BID) | INTRAMUSCULAR | Status: DC
  Administered 2014-09-10 – 2014-09-15 (×10): 3 mL via INTRAVENOUS

## 2014-09-09 MED ORDER — THIAMINE HCL 100 MG/ML IJ SOLN
100.0000 mg | Freq: Once | INTRAMUSCULAR | Status: AC
  Administered 2014-09-09: 100 mg via INTRAVENOUS
  Filled 2014-09-09: qty 2

## 2014-09-09 NOTE — Progress Notes (Signed)
eLink Physician-Brief Progress Note Patient Name: [name] DOB: Sep 08, 1940 MRN: [id number]   Date of Service  09/09/2014  HPI/Events of Note  74 yo M admitted for altered mental status. PMHx of AMS (recent admission 08/26/14), EtoH abuse, chronic back pain.  Poor appetite for the past couple of days and not moving around, found by family member today sitting in chair with little response, suspected of taking too much pain meds.  In the ED, sats noted to be in the low 90s on NRB, was given 1mg  of narcan, admitted to ICU for closer observation.  eICU Interventions  Respiratory distress -secondary to narcotic use - cont with close monitoring and bipap prn Respiratory\metabolic acidosis  - mixed acidosis, cont with bipap and IVFs - limit use of narcotics - hold metformin  GI\DVT prophylaxis     Intervention Category Evaluation Type: New Patient Evaluation  Lynnmarie Lovett 09/09/2014, 10:38 PM

## 2014-09-09 NOTE — ED Notes (Signed)
Patient brought in by EMS from home with complaint of altered mental status. Per EMS, upon arrival patient was sitting upright in chair with head hung over. Patient nods head and confirms that he took too much pain medication, dilaudid. Patient was given 1 mg narcan prior to arrival to ED with results. Patient on non-rebreather and alert upon arrival to ED. Per EMS, patient's O2 saturation 90% on non-rebreather.

## 2014-09-09 NOTE — ED Notes (Signed)
Any questions called wife [place].

## 2014-09-09 NOTE — H&P (Signed)
[name] is an 74 y.o. male.    Pcp: unassigned per pt  Chief Complaint: altered mental status HPI: 74 yo male with hx of hypertension, cad, copd apparently presents with altered mental status. Per ED report.  Pt is a poor historian,  Per ED reports pt stopped drinking a couple of weeks ago,  For the last several days the patient has been sitting in his chair rarely moving.  Pt has not been eating well for the past several days and appeared weak and confused,  Wife called EMS and pt was given narcan with slight improvement.  Pt denies taking extra medication.  CT brain in ER negative for any acute process.  Pt was noted to be in acute renal failure with pH of 7.188.  Pt will be admitted for acute renal failure.      Past Medical History  Diagnosis Date  . Asthma   . Hypertension   . Ureter cancer 12/2001    "shut down my kidney & resulted in nephrectomy" (07/09/2013)  . Coronary artery disease   . High cholesterol   . Heart murmur     "had rheumatic fever as a kid" (07/09/2013)  . Rheumatic fever   . Myocardial infarction 03/2002; ~ 2011  . Anginal pain   . Pneumonia     "once" (07/09/2013)  . Shortness of breath     "can happen at anytime" (07/09/2013)  . COPD (chronic obstructive pulmonary disease)     pt denies this hx on 07/09/2013  . Type II diabetes mellitus   . H/O hiatal hernia 1997  . Arthritis     "qwhere" (07/09/2013)  . Gout   . Renal disorder     "only have 1 kidney; due to go back soon to check the other one" (07/09/2013)    Past Surgical History  Procedure Laterality Date  . Nephrectomy Left 12/19/2001  . Femoral artery stent Right 07/09/2013  . Cholecystectomy  1980's  . Coronary artery bypass graft  ~ 2010    "CABG X5" (07/09/2013)  . Glaucoma surgery Bilateral ?[id number]  . Eye surgery    . Carpal tunnel release Bilateral ~2004; ~ 2011  . Trigger finger release Left ~ 1990's  . Cardiac catheterization  ~ 2010    "before the bypass" (07/09/2013)  . Below  knee leg amputation Left 08/2009  . Above knee leg amputation Left 09/2009  . Lower extremity angiogram Right Nov. 4, 2014  . Lower extremity angiogram Right 07/09/2013    Procedure: LOWER EXTREMITY ANGIOGRAM;  Surgeon: Serafina Mitchell, MD;  Location: [facility] CATH LAB;  Service: Cardiovascular;  Laterality: Right;    Family History  Problem Relation Age of Onset  . Cancer Mother   . Heart attack Father    Social History:  reports that he has been smoking Cigarettes.  He has a 54 pack-year smoking history. He has never used smokeless tobacco. He reports that he drinks alcohol. He reports that he does not use illicit drugs.  Allergies:  Allergies  Allergen Reactions  . Iohexol      Code: HIVES, Desc: PT REPORTS THROAT SWELLING, HIVES AND ITCHING, 13 HR PRE-MED GIVEN AND SUCCESSFUL-ARS 07/15/07   . Penicillins Itching and Swelling    Throat swelling  . Erythromycin Swelling and Rash    Throat swelling    Medications Prior to Admission  Medication Sig Dispense Refill  . albuterol (PROAIR HFA) 108 (90 BASE) MCG/ACT inhaler Inhale 2 puffs into the lungs every 6 (  six) hours as needed for wheezing or shortness of breath.     Marland Kitchen aspirin EC 81 MG EC tablet Take 1 tablet (81 mg total) by mouth daily.    . citalopram (CELEXA) 20 MG tablet Take 20 mg by mouth daily.     . diazepam (VALIUM) 10 MG tablet Take 10 mg by mouth every 6 (six) hours as needed for anxiety.     . diphenhydrAMINE (BENADRYL) 50 MG capsule Take one tablet at 7:30 AM 07/09/13 (take with last dose of Prednisone 50 mg) prior to procedure. 1 capsule 0  . folic acid (FOLVITE) 1 MG tablet Take 1 tablet (1 mg total) by mouth daily.    Marland Kitchen gabapentin (NEURONTIN) 100 MG capsule Take 200 mg by mouth 3 (three) times daily.     Marland Kitchen HYDROcodone-acetaminophen (NORCO) 7.5-325 MG per tablet Take 1 tablet by mouth every 4 (four) hours as needed for pain.     . metFORMIN (GLUCOPHAGE) 500 MG tablet Take 500 mg by mouth 2 (two) times daily with a meal.     . metoprolol (LOPRESSOR) 50 MG tablet Take 50 mg by mouth 2 (two) times daily.    . mupirocin ointment (BACTROBAN) 2 % Place 1 application into the nose 3 (three) times daily as needed.    . ramipril (ALTACE) 10 MG capsule Take 10 mg by mouth every morning.    . Tamsulosin HCl (FLOMAX) 0.4 MG CAPS Take 0.4 mg by mouth 2 (two) times daily.     Marland Kitchen thiamine 100 MG tablet Take 1 tablet (100 mg total) by mouth daily.    . triazolam (HALCION) 0.25 MG tablet Take 0.5 mg by mouth at bedtime.    Marland Kitchen zolpidem (AMBIEN) 10 MG tablet Take 40 mg by mouth at bedtime.     . nitroGLYCERIN (NITROLINGUAL) 0.4 MG/SPRAY spray Place 1 spray under the tongue every 5 (five) minutes as needed for chest pain.      Results for orders placed or performed during the hospital encounter of 09/09/14 (from the past 48 hour(s))  Blood gas, arterial ([facility] & [facility])     Status: Abnormal   Collection Time: 09/09/14  7:12 PM  Result Value Ref Range   O2 Content 2.0 L/min   Delivery systems NASAL CANNULA    pH, Arterial 7.188 (LL) 7.350 - 7.450    Comment: CRITICAL RESULT CALLED TO, READ BACK BY AND VERIFIED WITH: BRENDA NORMAN,RN BY BFRETWELL RRT,RCP ON 09/09/14 AT 2044    pCO2 arterial 52.5 (H) 35.0 - 45.0 mmHg   pO2, Arterial 87.3 80.0 - 100.0 mmHg   Bicarbonate 19.2 (L) 20.0 - 24.0 mEq/L   TCO2 18.4 0 - 100 mmol/L   Acid-Base Excess 7.7 (H) 0.0 - 2.0 mmol/L   Acid-base deficit 8.7 (H) 0.0 - 2.0 mmol/L   O2 Saturation 94.8 %   Collection site RIGHT RADIAL    Drawn by [id number]    Sample type ARTERIAL DRAW    Allens test (pass/fail) PASS PASS  APTT     Status: Abnormal   Collection Time: 09/09/14  8:06 PM  Result Value Ref Range   aPTT 38 (H) 24 - 37 seconds    Comment:        IF BASELINE aPTT IS ELEVATED, SUGGEST PATIENT RISK ASSESSMENT BE USED TO DETERMINE APPROPRIATE ANTICOAGULANT THERAPY.   CBC WITH DIFFERENTIAL     Status: Abnormal   Collection Time: 09/09/14  8:06 PM  Result Value Ref Range   WBC 6.4 4.0 -  10.5  K/uL   RBC 3.42 (L) 4.22 - 5.81 MIL/uL   Hemoglobin 11.2 (L) 13.0 - 17.0 g/dL   HCT 35.5 (L) 39.0 - 52.0 %   MCV 103.8 (H) 78.0 - 100.0 fL   MCH 32.7 26.0 - 34.0 pg   MCHC 31.5 30.0 - 36.0 g/dL   RDW 14.6 11.5 - 15.5 %   Platelets 330 150 - 400 K/uL   Neutrophils Relative % 79 (H) 43 - 77 %   Neutro Abs 5.1 1.7 - 7.7 K/uL   Lymphocytes Relative 13 12 - 46 %   Lymphs Abs 0.8 0.7 - 4.0 K/uL   Monocytes Relative 7 3 - 12 %   Monocytes Absolute 0.4 0.1 - 1.0 K/uL   Eosinophils Relative 1 0 - 5 %   Eosinophils Absolute 0.0 0.0 - 0.7 K/uL   Basophils Relative 0 0 - 1 %   Basophils Absolute 0.0 0.0 - 0.1 K/uL  Comprehensive metabolic panel     Status: Abnormal   Collection Time: 09/09/14  8:06 PM  Result Value Ref Range   Sodium 134 (L) 135 - 145 mmol/L    Comment: Please note change in reference range.   Potassium 5.0 3.5 - 5.1 mmol/L    Comment: Please note change in reference range.   Chloride 104 96 - 112 mEq/L   CO2 21 19 - 32 mmol/L   Glucose, Bld 93 70 - 99 mg/dL   BUN 34 (H) 6 - 23 mg/dL   Creatinine, Ser 2.91 (H) 0.50 - 1.35 mg/dL   Calcium 8.4 8.4 - 10.5 mg/dL   Total Protein 6.7 6.0 - 8.3 g/dL   Albumin 2.8 (L) 3.5 - 5.2 g/dL   AST 16 0 - 37 U/L   ALT 11 0 - 53 U/L   Alkaline Phosphatase 60 39 - 117 U/L   Total Bilirubin 0.6 0.3 - 1.2 mg/dL   GFR calc non Af Amer 20 (L) >90 mL/min   GFR calc Af Amer 23 (L) >90 mL/min    Comment: (NOTE) The eGFR has been calculated using the CKD EPI equation. This calculation has not been validated in all clinical situations. eGFR's persistently <90 mL/min signify possible Chronic Kidney Disease.    Anion gap 9 5 - 15  Protime-INR     Status: None   Collection Time: 09/09/14  8:06 PM  Result Value Ref Range   Prothrombin Time 14.8 11.6 - 15.2 seconds   INR 1.15 0.00 - 1.49  Ammonia     Status: None   Collection Time: 09/09/14  8:06 PM  Result Value Ref Range   Ammonia 19 11 - 32 umol/L    Comment: Please note change in  reference range.  Ethanol     Status: None   Collection Time: 09/09/14  8:06 PM  Result Value Ref Range   Alcohol, Ethyl (B) <5 0 - 9 mg/dL    Comment:        LOWEST DETECTABLE LIMIT FOR SERUM ALCOHOL IS 11 mg/dL FOR MEDICAL PURPOSES ONLY   Troponin I     Status: None   Collection Time: 09/09/14  8:06 PM  Result Value Ref Range   Troponin I 0.03 <0.031 ng/mL    Comment:        NO INDICATION OF MYOCARDIAL INJURY. Please note change in reference range.   I-Stat CG4 Lactic Acid, ED     Status: None   Collection Time: 09/09/14  8:17 PM  Result Value Ref  Range   Lactic Acid, Venous 0.72 0.5 - 2.2 mmol/L  CBG monitoring, ED     Status: None   Collection Time: 09/09/14  8:42 PM  Result Value Ref Range   Glucose-Capillary 87 70 - 99 mg/dL   Dg Chest 1 View  09/09/2014   CLINICAL DATA:  Recent false, chest pain, initial encounter  EXAM: CHEST - 1 VIEW  COMPARISON:  08/26/2014  FINDINGS: Cardiac shadow is stable. Postsurgical changes are again seen. The lungs are well aerated bilaterally. Mild interstitial changes are again identified. No focal infiltrate is noted.  IMPRESSION: No acute abnormality seen.   Electronically Signed   By: Inez Catalina M.D.   On: 09/09/2014 19:58   Ct Head Wo Contrast  09/09/2014   CLINICAL DATA:  [place].  Hit head.  EXAM: CT HEAD WITHOUT CONTRAST  CT CERVICAL SPINE WITHOUT CONTRAST  TECHNIQUE: Multidetector CT imaging of the head and cervical spine was performed following the standard protocol without intravenous contrast. Multiplanar CT image reconstructions of the cervical spine were also generated.  COMPARISON:  08/26/2014  FINDINGS: CT HEAD FINDINGS  Stable age related cerebral atrophy, ventriculomegaly and periventricular white matter disease. No extra-axial fluid collections are identified. No CT findings for acute hemispheric infarction or intracranial hemorrhage. No mass lesions. The brainstem and cerebellum are normal.  No acute skull fracture is identified.  The paranasal sinuses and mastoid air cells are grossly clear. The globes are intact.  CT CERVICAL SPINE FINDINGS  Moderate degenerative cervical spondylosis with multilevel disc disease and facet disease. No acute bony findings. No abnormal prevertebral soft tissue swelling. The skullbase C1 and C1-2 articulations are maintained. Extensive carotid artery calcifications are noted. The lung apices are clear.  IMPRESSION: 1. Stable age related cerebral atrophy, ventriculomegaly and periventricular white matter disease. 2. No acute intracranial findings or skull fracture. 3. Degenerative cervical spondylosis but no acute cervical spine fracture.   Electronically Signed   By: Kalman Jewels M.D.   On: 09/09/2014 20:02   Ct Cervical Spine Wo Contrast  09/09/2014   CLINICAL DATA:  [place].  Hit head.  EXAM: CT HEAD WITHOUT CONTRAST  CT CERVICAL SPINE WITHOUT CONTRAST  TECHNIQUE: Multidetector CT imaging of the head and cervical spine was performed following the standard protocol without intravenous contrast. Multiplanar CT image reconstructions of the cervical spine were also generated.  COMPARISON:  08/26/2014  FINDINGS: CT HEAD FINDINGS  Stable age related cerebral atrophy, ventriculomegaly and periventricular white matter disease. No extra-axial fluid collections are identified. No CT findings for acute hemispheric infarction or intracranial hemorrhage. No mass lesions. The brainstem and cerebellum are normal.  No acute skull fracture is identified. The paranasal sinuses and mastoid air cells are grossly clear. The globes are intact.  CT CERVICAL SPINE FINDINGS  Moderate degenerative cervical spondylosis with multilevel disc disease and facet disease. No acute bony findings. No abnormal prevertebral soft tissue swelling. The skullbase C1 and C1-2 articulations are maintained. Extensive carotid artery calcifications are noted. The lung apices are clear.  IMPRESSION: 1. Stable age related cerebral atrophy,  ventriculomegaly and periventricular white matter disease. 2. No acute intracranial findings or skull fracture. 3. Degenerative cervical spondylosis but no acute cervical spine fracture.   Electronically Signed   By: Kalman Jewels M.D.   On: 09/09/2014 20:02   Dg Shoulder Left  09/09/2014   CLINICAL DATA:  Left shoulder pain.  Altered mental status.  EXAM: LEFT SHOULDER - 2+ VIEW  COMPARISON:  None.  FINDINGS: The joint  spaces are maintained. No acute fractures identified. Mild AC joint degenerative changes are noted. The visualized left lung apex is clear. Dense carotid artery calcifications are noted on the left side.  IMPRESSION: Degenerative changes but no acute fracture.   Electronically Signed   By: Kalman Jewels M.D.   On: 09/09/2014 20:12    Review of Systems  Constitutional: Negative for fever, chills, weight loss, malaise/fatigue and diaphoresis.  HENT: Negative for congestion, ear discharge, ear pain, hearing loss, nosebleeds, sore throat and tinnitus.   Eyes: Negative for blurred vision, double vision, photophobia, pain, discharge and redness.  Respiratory: Negative for cough, hemoptysis, sputum production, shortness of breath, wheezing and stridor.   Cardiovascular: Negative for chest pain, palpitations, orthopnea, claudication, leg swelling and PND.  Gastrointestinal: Negative for heartburn, nausea, vomiting, abdominal pain, diarrhea, constipation, blood in stool and melena.  Genitourinary: Negative for dysuria, urgency, frequency, hematuria and flank pain.  Musculoskeletal: Negative for myalgias, back pain, joint pain, falls and neck pain.  Skin: Negative for itching and rash.  Neurological: Negative for dizziness, tingling, tremors, sensory change, speech change, focal weakness, seizures, loss of consciousness, weakness and headaches.  Endo/Heme/Allergies: Negative for environmental allergies and polydipsia. Does not bruise/bleed easily.  Psychiatric/Behavioral: Negative for  depression, suicidal ideas, hallucinations, memory loss and substance abuse. The patient is not nervous/anxious and does not have insomnia.     Blood pressure 109/82, pulse 107, temperature 99.5 F (37.5 C), temperature source Axillary, resp. rate 19, height 6' (1.829 m), weight 89.9 kg (198 lb 3.1 oz), SpO2 100 %. Physical Exam  Constitutional: He is oriented to person, place, and time. He appears well-developed and well-nourished.  HENT:  Head: Normocephalic and atraumatic.  Mm dry  Eyes: Conjunctivae and EOM are normal. Pupils are equal, round, and reactive to light.  Neck: Normal range of motion. Neck supple. No JVD present. No tracheal deviation present. No thyromegaly present.  Cardiovascular: Normal rate and regular rhythm.  Exam reveals no gallop and no friction rub.   No murmur heard. Respiratory: Effort normal and breath sounds normal. No respiratory distress. He has no wheezes. He has no rales. He exhibits no tenderness.  GI: Soft. Bowel sounds are normal. He exhibits no distension. There is no tenderness. There is no rebound and no guarding.  Musculoskeletal: Normal range of motion. He exhibits no edema or tenderness.  Lymphadenopathy:    He has no cervical adenopathy.  Neurological: He is alert and oriented to person, place, and time. He has normal reflexes. He displays normal reflexes. No cranial nerve deficit. He exhibits normal muscle tone. Coordination normal.  Skin: Skin is warm and dry. No rash noted. No erythema. No pallor.  Left aka  Psychiatric: He has a normal mood and affect. His behavior is normal. Judgment and thought content normal.     Assessment/Plan Acute renal failure Check urine sodium, urine creatinine, urine eosinophils Check urinalysis Check renal ultrasound Hydrate with normal saline  Anemia Check cbc in am  Acidosis Likely secondary to lactic acidosis resultant from acute renal failure with metformin  Hypotension Telemetry, check cortisol,  check trop i q6h x3 Consider echo  Hypercarbia bipap per respiratory protocol  Gout In the right ankle?  Dm2 fsbs ac and qhs, iss D/c metformin due to renal failure  Hypertension Cont metoprolol  Bph Cont flomax  DVT prophylaxis lovenox  Jani Gravel 09/09/2014, 10:51 PM

## 2014-09-09 NOTE — ED Provider Notes (Signed)
CSN: [id number]     Arrival date & time 09/09/14  1847 History   First MD Initiated Contact with Patient 09/09/14 1859     Chief Complaint  Patient presents with  . Altered Mental Status    HPI The patient was brought into the emergency room for evaluation of altered mental status. Patient has a history of chronic alcoholism. He stopped drinking a couple of weeks ago. Patient has had a significant worsening of his health in the last couple of weeks. Patient was previously admitted to the hospital on December 22 for an episode of altered mental status. The patient was monitored overnight and eventually was released. For the last several days patient is primarily been sitting in his chair. He rarely moves out of the chair. He has been having issues with chronic pain and is taking pain medications. Family states he has not been eating well for several days. He has only been drinking a little bit of liquid. At home he was confused and very weak. The wife could not pick him up. She called EMS. EMS found that the patient was sitting upright in the chair with his head however. He told them that he took too much pain medications and they gave him a milligram of Narcan. The patient has oral Dilaudid at home. In the emergency room the patient denies that he took any extra pain medications. He is feeling weak. He has had some pain in his chest but cannot be more specific than that. He denies any recent injuries. Past Medical History  Diagnosis Date  . Asthma   . Hypertension   . Ureter cancer 12/2001    "shut down my kidney & resulted in nephrectomy" (07/09/2013)  . Coronary artery disease   . High cholesterol   . Heart murmur     "had rheumatic fever as a kid" (07/09/2013)  . Rheumatic fever   . Myocardial infarction 03/2002; ~ 2011  . Anginal pain   . Pneumonia     "once" (07/09/2013)  . Shortness of breath     "can happen at anytime" (07/09/2013)  . COPD (chronic obstructive pulmonary disease)     pt  denies this hx on 07/09/2013  . Type II diabetes mellitus   . H/O hiatal hernia 1997  . Arthritis     "qwhere" (07/09/2013)  . Gout   . Renal disorder     "only have 1 kidney; due to go back soon to check the other one" (07/09/2013)   Past Surgical History  Procedure Laterality Date  . Nephrectomy Left 12/19/2001  . Femoral artery stent Right 07/09/2013  . Cholecystectomy  1980's  . Coronary artery bypass graft  ~ 2010    "CABG X5" (07/09/2013)  . Glaucoma surgery Bilateral ?[id number]'I  . Eye surgery    . Carpal tunnel release Bilateral ~2004; ~ 2011  . Trigger finger release Left ~ 1990's  . Cardiac catheterization  ~ 2010    "before the bypass" (07/09/2013)  . Below knee leg amputation Left 08/2009  . Above knee leg amputation Left 09/2009  . Lower extremity angiogram Right Nov. 4, 2014  . Lower extremity angiogram Right 07/09/2013    Procedure: LOWER EXTREMITY ANGIOGRAM;  Surgeon: Serafina Mitchell, MD;  Location: [facility] C[facility];  Service: Cardiovascular;  Laterality: Right;   Family History  Problem Relation Age of Onset  . Cancer Mother   . Heart attack Father    History  Substance Use Topics  . Smoking status: Current  Every Day Smoker -- 1.00 packs/day for 54 years    Types: Cigarettes  . Smokeless tobacco: Never Used  . Alcohol Use: 0.0 oz/week     Comment: social drinker    Review of Systems    Allergies  Iohexol; Penicillins; and Erythromycin  Home Medications   Prior to Admission medications   Medication Sig Start Date End Date Taking? Authorizing Provider  albuterol (PROAIR HFA) 108 (90 BASE) MCG/ACT inhaler Inhale 2 puffs into the lungs every 6 (six) hours as needed for wheezing or shortness of breath.     Historical Provider, MD  aspirin EC 81 MG EC tablet Take 1 tablet (81 mg total) by mouth daily. 07/10/13   Regina J Roczniak, PA-C  citalopram (CELEXA) 20 MG tablet Take 20 mg by mouth daily.     Historical Provider, MD  diazepam (VALIUM) 10 MG tablet Take 10  mg by mouth every 6 (six) hours as needed for anxiety.     Historical Provider, MD  diphenhydrAMINE (BENADRYL) 50 MG capsule Take one tablet at 7:30 AM 07/09/13 (take with last dose of Prednisone 50 mg) prior to procedure. 07/05/13   Serafina Mitchell, MD  folic acid (FOLVITE) 1 MG tablet Take 1 tablet (1 mg total) by mouth daily. 08/27/14   Erline Hau, MD  gabapentin (NEURONTIN) 100 MG capsule Take 200 mg by mouth 3 (three) times daily.     Historical Provider, MD  HYDROcodone-acetaminophen (NORCO) 7.5-325 MG per tablet Take 1 tablet by mouth every 4 (four) hours as needed for pain.     Historical Provider, MD  ketoconazole (NIZORAL) 2 % cream Apply 1 application topically daily. 2 week course starting on 08/15/2014    Historical Provider, MD  ketoconazole (NIZORAL) 200 MG tablet Take 200 mg by mouth daily. 2 week course starting on 08/15/2014    Historical Provider, MD  metFORMIN (GLUCOPHAGE) 500 MG tablet Take 500 mg by mouth 2 (two) times daily with a meal.    Historical Provider, MD  metoprolol (LOPRESSOR) 50 MG tablet Take 50 mg by mouth 2 (two) times daily.    Historical Provider, MD  mupirocin ointment (BACTROBAN) 2 % Place 1 application into the nose 3 (three) times daily as needed.    Historical Provider, MD  nitroGLYCERIN (NITROLINGUAL) 0.4 MG/SPRAY spray Place 1 spray under the tongue every 5 (five) minutes as needed for chest pain.    Historical Provider, MD  ramipril (ALTACE) 10 MG capsule Take 10 mg by mouth every morning.    Historical Provider, MD  Tamsulosin HCl (FLOMAX) 0.4 MG CAPS Take 0.4 mg by mouth 2 (two) times daily.     Historical Provider, MD  thiamine 100 MG tablet Take 1 tablet (100 mg total) by mouth daily. 08/27/14   Erline Hau, MD  triazolam (HALCION) 0.25 MG tablet Take 0.5 mg by mouth at bedtime.    Historical Provider, MD  zolpidem (AMBIEN) 10 MG tablet Take 40 mg by mouth at bedtime.     Historical Provider, MD   BP 101/59 mmHg  Pulse  102  Temp(Src) 98.5 F (36.9 C) (Axillary)  Resp 13  Ht 5\' 10"  (1.778 m)  Wt 180 lb (81.647 kg)  BMI 25.83 kg/m2  SpO2 91% Physical Exam  Constitutional: He has a sickly appearance. He appears ill. He appears distressed.  HENT:  Head: Normocephalic. Head is without raccoon's eyes (small amount of bruising around the right eye) and without laceration.  Mouth/Throat: No trismus in  the jaw. No uvula swelling.  Patient has a large amount of cleared school around his mouth, he does not have any upper teeth, at rest he has his lower mandible protruding  Eyes: Conjunctivae and EOM are normal. Pupils are equal, round, and reactive to light.  Neck: Trachea normal. Neck supple. No JVD present. No tracheal deviation present. No thyroid mass present.  Cardiovascular: S1 normal and S2 normal.  Tachycardia present.   Pulmonary/Chest: Breath sounds normal. No accessory muscle usage or stridor. No tachypnea. No respiratory distress.  Abdominal: Soft. Normal appearance. There is no tenderness. There is no rigidity, no rebound, no guarding and negative Murphy's sign.  Musculoskeletal:       Right shoulder: He exhibits no tenderness.  Status post BKA of the left lower extremity, edema in the right lower extremity  Neurological: He is alert. No cranial nerve deficit. He exhibits abnormal muscle tone. He displays no seizure activity. GCS eye subscore is 4. GCS verbal subscore is 5. GCS motor subscore is 6.  Generalized weakness, the patient has difficulty lifting both arms off the bed, he has difficulty lifting his right leg off the bed, his speech is garbled and this appears to be related to his protruding lower teeth, however at times when I speak to him I can get him to speak normally  Skin: Rash is not vesicular. He is not diaphoretic.  Mottled skin color  Psychiatric: He is withdrawn. He exhibits a depressed mood.    ED Course  Procedures (including critical care time) CRITICAL CARE Performed by:  WUJWJ,XBJ Total critical care time: 35 Critical care time was exclusive of separately billable procedures and treating other patients. Critical care was necessary to treat or prevent imminent or life-threatening deterioration. Critical care was time spent personally by me on the following activities: development of treatment plan with patient and/or surrogate as well as nursing, discussions with consultants, evaluation of patient's response to treatment, examination of patient, obtaining history from patient or surrogate, ordering and performing treatments and interventions, ordering and review of laboratory studies, ordering and review of radiographic studies, pulse oximetry and re-evaluation of patient's condition.   Labs Review Labs Reviewed  APTT - Abnormal; Notable for the following:    aPTT 38 (*)    All other components within normal limits  CBC WITH DIFFERENTIAL - Abnormal; Notable for the following:    RBC 3.42 (*)    Hemoglobin 11.2 (*)    HCT 35.5 (*)    MCV 103.8 (*)    Neutrophils Relative % 79 (*)    All other components within normal limits  COMPREHENSIVE METABOLIC PANEL - Abnormal; Notable for the following:    Sodium 134 (*)    BUN 34 (*)    Creatinine, Ser 2.91 (*)    Albumin 2.8 (*)    GFR calc non Af Amer 20 (*)    GFR calc Af Amer 23 (*)    All other components within normal limits  BLOOD GAS, ARTERIAL - Abnormal; Notable for the following:    pH, Arterial 7.188 (*)    pCO2 arterial 52.5 (*)    Bicarbonate 19.2 (*)    Acid-Base Excess 7.7 (*)    Acid-base deficit 8.7 (*)    All other components within normal limits  PROTIME-INR  AMMONIA  ETHANOL  TROPONIN I  URINALYSIS, ROUTINE W REFLEX MICROSCOPIC  URINE RAPID DRUG SCREEN (HOSP PERFORMED)  I-STAT CG4 LACTIC ACID, ED  CBG MONITORING, ED    Imaging Review Dg  Chest 1 View  09/09/2014   CLINICAL DATA:  Recent false, chest pain, initial encounter  EXAM: CHEST - 1 VIEW  COMPARISON:  08/26/2014  FINDINGS:  Cardiac shadow is stable. Postsurgical changes are again seen. The lungs are well aerated bilaterally. Mild interstitial changes are again identified. No focal infiltrate is noted.  IMPRESSION: No acute abnormality seen.   Electronically Signed   By: Inez Catalina M.D.   On: 09/09/2014 19:58   Ct Head Wo Contrast  09/09/2014   CLINICAL DATA:  [place].  Hit head.  EXAM: CT HEAD WITHOUT CONTRAST  CT CERVICAL SPINE WITHOUT CONTRAST  TECHNIQUE: Multidetector CT imaging of the head and cervical spine was performed following the standard protocol without intravenous contrast. Multiplanar CT image reconstructions of the cervical spine were also generated.  COMPARISON:  08/26/2014  FINDINGS: CT HEAD FINDINGS  Stable age related cerebral atrophy, ventriculomegaly and periventricular white matter disease. No extra-axial fluid collections are identified. No CT findings for acute hemispheric infarction or intracranial hemorrhage. No mass lesions. The brainstem and cerebellum are normal.  No acute skull fracture is identified. The paranasal sinuses and mastoid air cells are grossly clear. The globes are intact.  CT CERVICAL SPINE FINDINGS  Moderate degenerative cervical spondylosis with multilevel disc disease and facet disease. No acute bony findings. No abnormal prevertebral soft tissue swelling. The skullbase C1 and C1-2 articulations are maintained. Extensive carotid artery calcifications are noted. The lung apices are clear.  IMPRESSION: 1. Stable age related cerebral atrophy, ventriculomegaly and periventricular white matter disease. 2. No acute intracranial findings or skull fracture. 3. Degenerative cervical spondylosis but no acute cervical spine fracture.   Electronically Signed   By: Kalman Jewels M.D.   On: 09/09/2014 20:02   Ct Cervical Spine Wo Contrast  09/09/2014   CLINICAL DATA:  [place].  Hit head.  EXAM: CT HEAD WITHOUT CONTRAST  CT CERVICAL SPINE WITHOUT CONTRAST  TECHNIQUE: Multidetector CT imaging of the  head and cervical spine was performed following the standard protocol without intravenous contrast. Multiplanar CT image reconstructions of the cervical spine were also generated.  COMPARISON:  08/26/2014  FINDINGS: CT HEAD FINDINGS  Stable age related cerebral atrophy, ventriculomegaly and periventricular white matter disease. No extra-axial fluid collections are identified. No CT findings for acute hemispheric infarction or intracranial hemorrhage. No mass lesions. The brainstem and cerebellum are normal.  No acute skull fracture is identified. The paranasal sinuses and mastoid air cells are grossly clear. The globes are intact.  CT CERVICAL SPINE FINDINGS  Moderate degenerative cervical spondylosis with multilevel disc disease and facet disease. No acute bony findings. No abnormal prevertebral soft tissue swelling. The skullbase C1 and C1-2 articulations are maintained. Extensive carotid artery calcifications are noted. The lung apices are clear.  IMPRESSION: 1. Stable age related cerebral atrophy, ventriculomegaly and periventricular white matter disease. 2. No acute intracranial findings or skull fracture. 3. Degenerative cervical spondylosis but no acute cervical spine fracture.   Electronically Signed   By: Kalman Jewels M.D.   On: 09/09/2014 20:02   Dg Shoulder Left  09/09/2014   CLINICAL DATA:  Left shoulder pain.  Altered mental status.  EXAM: LEFT SHOULDER - 2+ VIEW  COMPARISON:  None.  FINDINGS: The joint spaces are maintained. No acute fractures identified. Mild AC joint degenerative changes are noted. The visualized left lung apex is clear. Dense carotid artery calcifications are noted on the left side.  IMPRESSION: Degenerative changes but no acute fracture.   Electronically Signed  By: Kalman Jewels M.D.   On: 09/09/2014 20:12     EKG Interpretation   Date/Time:  Tuesday September 09 2014 20:31:24 EST Ventricular Rate:  102 PR Interval:  178 QRS Duration: 125 QT Interval:  381 QTC  Calculation: 496 R Axis:   83 Text Interpretation:  Sinus tachycardia Nonspecific intraventricular  conduction delay Anteroseptal infarct, old Borderline repol abnrm,  inferolateral leads , increased in lateral leads since last tracing  Confirmed by Ayde Record  MD-J, Brylee Berk ([id number]) on 09/09/2014 8:40:10 PM      2111  the patient's mental status is improving. He is more alert and awake.  Patient is able to have a clear conversation with me right now. I explained to him that we will be admitting him to the hospital for observation. MDM   Final diagnoses:  Fall  Respiratory acidosis  Acute renal failure, unspecified acute renal failure type    The patient's laboratory tests show a significant respiratory acidosis. The patient here is breathing easily.  I do not feel that he requires intubation. I will start him on BiPAP and plan on rechecking an ABG. I suspect his respiratory acidosis is associated with somnolence from his medications, specifically the oral Dilaudid.  No CHF or PNA on xray.  Doubt sepsis or stroke.        Dorie Rank, MD 09/09/14 2113

## 2014-09-10 ENCOUNTER — Inpatient Hospital Stay (HOSPITAL_COMMUNITY): Payer: Medicare Other

## 2014-09-10 DIAGNOSIS — J9600 Acute respiratory failure, unspecified whether with hypoxia or hypercapnia: Secondary | ICD-10-CM | POA: Diagnosis present

## 2014-09-10 DIAGNOSIS — R627 Adult failure to thrive: Secondary | ICD-10-CM | POA: Diagnosis present

## 2014-09-10 DIAGNOSIS — G9340 Encephalopathy, unspecified: Secondary | ICD-10-CM

## 2014-09-10 DIAGNOSIS — I2510 Atherosclerotic heart disease of native coronary artery without angina pectoris: Secondary | ICD-10-CM

## 2014-09-10 DIAGNOSIS — E46 Unspecified protein-calorie malnutrition: Secondary | ICD-10-CM | POA: Diagnosis present

## 2014-09-10 DIAGNOSIS — F1027 Alcohol dependence with alcohol-induced persisting dementia: Secondary | ICD-10-CM | POA: Diagnosis present

## 2014-09-10 DIAGNOSIS — J9602 Acute respiratory failure with hypercapnia: Secondary | ICD-10-CM

## 2014-09-10 DIAGNOSIS — I10 Essential (primary) hypertension: Secondary | ICD-10-CM

## 2014-09-10 DIAGNOSIS — M25512 Pain in left shoulder: Secondary | ICD-10-CM | POA: Diagnosis present

## 2014-09-10 DIAGNOSIS — R296 Repeated falls: Secondary | ICD-10-CM

## 2014-09-10 DIAGNOSIS — E1151 Type 2 diabetes mellitus with diabetic peripheral angiopathy without gangrene: Secondary | ICD-10-CM

## 2014-09-10 DIAGNOSIS — I25119 Atherosclerotic heart disease of native coronary artery with unspecified angina pectoris: Secondary | ICD-10-CM | POA: Diagnosis present

## 2014-09-10 LAB — URINE MICROSCOPIC-ADD ON

## 2014-09-10 LAB — BASIC METABOLIC PANEL
ANION GAP: 5 (ref 5–15)
BUN: 28 mg/dL — ABNORMAL HIGH (ref 6–23)
CO2: 24 mmol/L (ref 19–32)
CREATININE: 1.74 mg/dL — AB (ref 0.50–1.35)
Calcium: 8.2 mg/dL — ABNORMAL LOW (ref 8.4–10.5)
Chloride: 107 mEq/L (ref 96–112)
GFR calc Af Amer: 44 mL/min — ABNORMAL LOW (ref >90)
GFR, EST NON AFRICAN AMERICAN: 38 mL/min — AB (ref >90)
Glucose, Bld: 93 mg/dL (ref 70–99)
POTASSIUM: 4.8 mmol/L (ref 3.5–5.1)
Sodium: 136 mmol/L (ref 135–145)

## 2014-09-10 LAB — GLUCOSE, CAPILLARY
GLUCOSE-CAPILLARY: 118 mg/dL — AB (ref 70–99)
Glucose-Capillary: 102 mg/dL — ABNORMAL HIGH (ref 70–99)
Glucose-Capillary: 97 mg/dL (ref 70–99)

## 2014-09-10 LAB — CBC
HCT: 35.1 % — ABNORMAL LOW (ref 39.0–52.0)
Hemoglobin: 11 g/dL — ABNORMAL LOW (ref 13.0–17.0)
MCH: 32.5 pg (ref 26.0–34.0)
MCHC: 31.3 g/dL (ref 30.0–36.0)
MCV: 103.8 fL — ABNORMAL HIGH (ref 78.0–100.0)
Platelets: 322 10*3/uL (ref 150–400)
RBC: 3.38 MIL/uL — ABNORMAL LOW (ref 4.22–5.81)
RDW: 14.3 % (ref 11.5–15.5)
WBC: 6.6 10*3/uL (ref 4.0–10.5)

## 2014-09-10 LAB — URINALYSIS, ROUTINE W REFLEX MICROSCOPIC
BILIRUBIN URINE: NEGATIVE
Glucose, UA: NEGATIVE mg/dL
Ketones, ur: NEGATIVE mg/dL
Nitrite: NEGATIVE
Protein, ur: NEGATIVE mg/dL
Specific Gravity, Urine: >1.03 — ABNORMAL HIGH (ref 1.005–1.030)
UROBILINOGEN UA: 0.2 mg/dL (ref 0.0–1.0)
pH: 5.5 (ref 5.0–8.0)

## 2014-09-10 LAB — TROPONIN I
TROPONIN I: 0.03 ng/mL (ref <0.031)
Troponin I: 0.03 ng/mL (ref <0.031)

## 2014-09-10 LAB — BLOOD GAS, ARTERIAL
Acid-base deficit: 5.9 mmol/L — ABNORMAL HIGH (ref 0.0–2.0)
Bicarbonate: 19.9 mEq/L — ABNORMAL LOW (ref 20.0–24.0)
DRAWN BY: 25955
O2 Saturation: 93.9 %
PCO2 ART: 44.8 mmHg (ref 35.0–45.0)
PO2 ART: 72.2 mmHg — AB (ref 80.0–100.0)
Patient temperature: 37
TCO2: 18.7 mmol/L (ref 0–100)
pH, Arterial: 7.27 — ABNORMAL LOW (ref 7.350–7.450)

## 2014-09-10 LAB — RAPID URINE DRUG SCREEN, HOSP PERFORMED
Amphetamines: NOT DETECTED
Barbiturates: NOT DETECTED
Benzodiazepines: POSITIVE — AB
Cocaine: NOT DETECTED
Opiates: POSITIVE — AB
Tetrahydrocannabinol: NOT DETECTED

## 2014-09-10 LAB — SODIUM, URINE, RANDOM: Sodium, Ur: 92 mmol/L

## 2014-09-10 LAB — CORTISOL: Cortisol, Plasma: 23.8 ug/dL

## 2014-09-10 MED ORDER — HALOPERIDOL LACTATE 5 MG/ML IJ SOLN
2.0000 mg | Freq: Four times a day (QID) | INTRAMUSCULAR | Status: DC | PRN
  Administered 2014-09-10 – 2014-09-11 (×3): 2 mg via INTRAVENOUS
  Filled 2014-09-10 (×3): qty 1

## 2014-09-10 MED ORDER — DICLOFENAC SODIUM 1 % TD GEL
2.0000 g | Freq: Three times a day (TID) | TRANSDERMAL | Status: DC
  Administered 2014-09-10 – 2014-09-15 (×14): 2 g via TOPICAL
  Filled 2014-09-10: qty 100

## 2014-09-10 MED ORDER — SENNOSIDES-DOCUSATE SODIUM 8.6-50 MG PO TABS
1.0000 | ORAL_TABLET | Freq: Every day | ORAL | Status: DC
  Administered 2014-09-10 – 2014-09-14 (×5): 1 via ORAL
  Filled 2014-09-10 (×5): qty 1

## 2014-09-10 MED ORDER — FAMOTIDINE 20 MG PO TABS
20.0000 mg | ORAL_TABLET | Freq: Every day | ORAL | Status: DC
  Administered 2014-09-10 – 2014-09-15 (×6): 20 mg via ORAL
  Filled 2014-09-10 (×6): qty 1

## 2014-09-10 MED ORDER — ENSURE COMPLETE PO LIQD
237.0000 mL | Freq: Two times a day (BID) | ORAL | Status: DC
  Administered 2014-09-10 – 2014-09-15 (×10): 237 mL via ORAL

## 2014-09-10 MED ORDER — INSULIN ASPART 100 UNIT/ML ~~LOC~~ SOLN: 0.0000 [IU] | Freq: Every day | SUBCUTANEOUS | Status: DC

## 2014-09-10 MED ORDER — INSULIN ASPART 100 UNIT/ML ~~LOC~~ SOLN
0.0000 [IU] | Freq: Three times a day (TID) | SUBCUTANEOUS | Status: DC
Start: 2014-09-10 — End: 2014-09-15
  Administered 2014-09-12: 1 [IU] via SUBCUTANEOUS
  Administered 2014-09-13 (×2): 3 [IU] via SUBCUTANEOUS
  Administered 2014-09-14: 2 [IU] via SUBCUTANEOUS
  Administered 2014-09-14: 3 [IU] via SUBCUTANEOUS
  Administered 2014-09-14: 1 [IU] via SUBCUTANEOUS
  Administered 2014-09-15: 2 [IU] via SUBCUTANEOUS

## 2014-09-10 MED ORDER — NITROGLYCERIN 0.4 MG SL SUBL: 0.4000 mg | SUBLINGUAL_TABLET | SUBLINGUAL | Status: DC | PRN

## 2014-09-10 MED ORDER — HEPARIN SODIUM (PORCINE) 5000 UNIT/ML IJ SOLN
5000.0000 [IU] | Freq: Three times a day (TID) | INTRAMUSCULAR | Status: DC
  Administered 2014-09-11 – 2014-09-15 (×13): 5000 [IU] via SUBCUTANEOUS
  Filled 2014-09-10 (×14): qty 1

## 2014-09-10 NOTE — Progress Notes (Signed)
Patient off bipap, patient is on RA with 02 saturations at 98%. RT will continue to monitor. BIPAP on standby at bedside.

## 2014-09-10 NOTE — Plan of Care (Signed)
Problem: ICU Phase Progression Outcomes Goal: Voiding-avoid urinary catheter unless indicated Outcome: Not Applicable Date Met:  94/85/46 Condom cath, but pt keeps pulling it off

## 2014-09-10 NOTE — Evaluation (Signed)
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 This is the patient's second admission/hospitalization in 2 weeks for essentially the same. The patient appears to be failing to thrive at home and has had recurrent falls out of the bed as he is an amputee. Per his wife, he has not been eating or drinking and has just been sitting around and not engaging in life. I asked the patient if he had difficulty swallowing or pain with swallowing or abdominal pain and he stated no, but he does have some difficulty chewing from a lack of teeth. His exam is unremarkable.  Clinical Impression  Patient is unable to follow directions and unable to care for self. Patient displays an inability to learn or follow directions and will not be able to participate with physical therapy indicating minimal ability to improve function at this time. If patient's altered mental status does not improve patient will not be able to benefit from skilled physical therapy. Patient's wife requires increased support to take care of husband. Recommended patient seek out assistance from home nursing for improved care taking of husband. PT will continue to follow patient during current hospital stay in hope of improving mental status so patient can qualify for SNF. Current recommendation: discharge home with home nursing for assistance in dressing and bed mobility.     Follow Up Recommendations Home health PT          Precautions / Restrictions Precautions Precautions: Fall Precaution Comments: fallen out of bed 2x in last weeks requiring MES for help. Restrictions Weight Bearing Restrictions: Yes LLE Weight Bearing: Non weight bearing      Mobility  Bed Mobility Overal bed mobility: +2 for physical  assistance             General bed mobility comments: unable to perform withotu 2 person assist due to weakness and inability to follow directions.   Transfers Overall transfer level: Needs assistance               General transfer comment: unable to perform  without max to near total assistance due to patient's inability to follow directions.   Ambulation/Gait                Stairs             Modified Rankin (Stroke Patients Only) Modified Rankin (Stroke Patients Only) Pre-Morbid Rankin Score: Severe disability Modified Rankin: Severe disability     Balance Overall balance assessment: History of Falls                                           Pertinent Vitals/Pain Pain Assessment: Faces Faces Pain Scale: Hurts even more Pain Location: Lt UE and LE    Home Living Family/patient expects to be discharged to:: Private residence Living Arrangements: Spouse/significant other Available Help at Discharge: Family Type of Home: House Home Access: Level entry     Home Layout: One level Home Equipment: Shower seat;Wheelchair - Education officer, community - power;Toilet riser Additional Comments: Patients wife is able to help patient, but is not strong enough to conitnue to provide all of patient's needs secondary to his increasing weakness.     Prior Function Level of Independence: Needs  assistance         Comments: Patient required assitance with all ADL's, dressing, bed mobility.      Hand Dominance        Extremity/Trunk Assessment   Upper Extremity Assessment: Generalized weakness;Difficult to assess due to impaired cognition           Lower Extremity Assessment: Generalized weakness;Difficult to assess due to impaired cognition      Cervical / Trunk Assessment:  (unable to assess due to inability to perform bed mobility. )  Communication   Communication: Other (comment) (patient displays impaired memory and limited  ability to continue a conversation)  Cognition Arousal/Alertness: Awake/alert Behavior During Therapy: Restless Overall Cognitive Status: History of cognitive impairments - at baseline Area of Impairment: Orientation;Memory;Safety/judgement;Awareness Orientation Level: Person   Memory: Decreased recall of precautions   Safety/Judgement: Decreased awareness of safety Awareness: Intellectual;Emergent   General Comments: patient appears unaware of self, loacation and situation as well as unable to follow directions or instructions. Overall patient displays a failure to learn.     General Comments      Exercises        Assessment/Plan    PT Assessment Patient needs continued PT services  PT Diagnosis Generalized weakness;Altered mental status   PT Problem List Decreased strength  PT Treatment Interventions Functional mobility training;Therapeutic exercise   PT Goals (Current goals can be found in the Care Plan section) Acute Rehab PT Goals Patient Stated Goal: to go home PT Goal Formulation: With patient Time For Goal Achievement: 09/17/14 Potential to Achieve Goals: Fair    Frequency Min 2X/week   Barriers to discharge Decreased caregiver support Patient curwently does not have enough suypport at home to perform bed mobility and care for self.     Co-evaluation               End of Session   Activity Tolerance: Patient limited by fatigue;Patient limited by lethargy Patient left: in bed Nurse Communication: Mobility status;Precautions         Time: 1400-1410 PT Time Calculation (min) (ACUTE ONLY): 10 min   Charges:   PT Evaluation $Initial PT Evaluation Tier I: 1 Procedure     PT G CodesDevona Konig PT DPT [phone]

## 2014-09-10 NOTE — Progress Notes (Signed)
RN took patient off bipap and placed on 3LNC. Patient seems to be tolerating well. 02 saturations are 96%, RN at bedside with patient giving ice. RT will continue to monitor.

## 2014-09-10 NOTE — Progress Notes (Signed)
Pt appears to be very uncomfortably and very restless and agitated, VSS, FLACC 6, low bed, HOB at 30 degrees, call bell at pt's side, will cont to monitor

## 2014-09-10 NOTE — Progress Notes (Signed)
[facility] PROGRESS NOTE  [name] FXT:[id number] DOB: 03-Jul-1941 DOA: 09/09/2014 PCP: Leonides Grills, MD    Code Status: Full code Family Communication: Discussed with wife Disposition Plan: Discharge when clinically appropriate: Home with home health versus short-term skilled nursing facility placement.   Consultants:  None  Procedures:  None  Antibiotics:  None  HPI/Subjective: The patient has no complaints of nausea, vomiting, chest pain, shortness of breath, abdominal pain, or pain with urination. He says that he just doesn't feel like eating. He denies difficulty swallowing or pain with swallowing. He does have some difficulty chewing because of the lack of teeth.  Objective: Filed Vitals:   09/10/14 0907  BP: 142/83  Pulse: 97  Temp:   Resp:    temperature 98.2. Respiratory rate 17. Oxygen saturation 99% on room air.  Intake/Output Summary (Last 24 hours) at 09/10/14 0942 Last data filed at 09/10/14 0500  Gross per 24 hour  Intake 495.83 ml  Output    200 ml  Net 295.83 ml   Filed Weights   09/09/14 1851 09/09/14 2217 09/10/14 0500  Weight: 81.647 kg (180 lb) 89.9 kg (198 lb 3.1 oz) 89.9 kg (198 lb 3.1 oz)    Exam:   General:  Large framed, debilitated appearing 74 year old man laying in bed, in no acute distress.  Oropharynx with multiple missing teeth and poor dentition; mucous membranes are dry; no posterior exudates or erythema.  Cardiovascular: Distant S1, S2, with soft systolic murmur.  Respiratory: Clear anteriorly with decreased breath sounds in the bases. Breathing is nonlabored.  Abdomen: Hypoactive bowel sounds, soft, nontender, nondistended.  Musculoskeletal/extremities: Moderate tenderness of the left shoulder with decrease in range of motion secondary to pain; mild warmth but no fluctuance; left BKA stump intact with no edema; right foot with mild edema and erythema and mild tenderness around ankle; no right calf  tenderness or edema.  Neurologic/psychiatric: He has a flat affect. He is alert and oriented to himself, year, place, and month. Cranial nerves II through XII are grossly intact.   Data Reviewed: Basic Metabolic Panel:  Recent Labs Lab 09/09/14 2006  NA 134*  K 5.0  CL 104  CO2 21  GLUCOSE 93  BUN 34*  CREATININE 2.91*  CALCIUM 8.4   Liver Function Tests:  Recent Labs Lab 09/09/14 2006  AST 16  ALT 11  ALKPHOS 60  BILITOT 0.6  PROT 6.7  ALBUMIN 2.8*   No results for input(s): LIPASE, AMYLASE in the last 168 hours.  Recent Labs Lab 09/09/14 2006  AMMONIA 19   CBC:  Recent Labs Lab 09/09/14 2006  WBC 6.4  NEUTROABS 5.1  HGB 11.2*  HCT 35.5*  MCV 103.8*  PLT 330   Cardiac Enzymes:  Recent Labs Lab 09/09/14 2006 09/09/14 2217 09/10/14 0447  TROPONINI 0.03 0.03 0.03   BNP (last 3 results) No results for input(s): PROBNP in the last 8760 hours. CBG:  Recent Labs Lab 09/09/14 2042  GLUCAP 87    Recent Results (from the past 240 hour(s))  MRSA PCR Screening     Status: None   Collection Time: 09/09/14 10:15 PM  Result Value Ref Range Status   MRSA by PCR NEGATIVE NEGATIVE Final    Comment:        The GeneXpert MRSA Assay (FDA approved for NASAL specimens only), is one component of a comprehensive MRSA colonization surveillance program. It is not intended to diagnose MRSA infection nor to guide or monitor treatment for MRSA infections.  Studies: Dg Chest 1 View  09/09/2014   CLINICAL DATA:  Recent false, chest pain, initial encounter  EXAM: CHEST - 1 VIEW  COMPARISON:  08/26/2014  FINDINGS: Cardiac shadow is stable. Postsurgical changes are again seen. The lungs are well aerated bilaterally. Mild interstitial changes are again identified. No focal infiltrate is noted.  IMPRESSION: No acute abnormality seen.   Electronically Signed   By: Inez Catalina M.D.   On: 09/09/2014 19:58   Ct Head Wo Contrast  09/09/2014   CLINICAL DATA:   [place].  Hit head.  EXAM: CT HEAD WITHOUT CONTRAST  CT CERVICAL SPINE WITHOUT CONTRAST  TECHNIQUE: Multidetector CT imaging of the head and cervical spine was performed following the standard protocol without intravenous contrast. Multiplanar CT image reconstructions of the cervical spine were also generated.  COMPARISON:  08/26/2014  FINDINGS: CT HEAD FINDINGS  Stable age related cerebral atrophy, ventriculomegaly and periventricular white matter disease. No extra-axial fluid collections are identified. No CT findings for acute hemispheric infarction or intracranial hemorrhage. No mass lesions. The brainstem and cerebellum are normal.  No acute skull fracture is identified. The paranasal sinuses and mastoid air cells are grossly clear. The globes are intact.  CT CERVICAL SPINE FINDINGS  Moderate degenerative cervical spondylosis with multilevel disc disease and facet disease. No acute bony findings. No abnormal prevertebral soft tissue swelling. The skullbase C1 and C1-2 articulations are maintained. Extensive carotid artery calcifications are noted. The lung apices are clear.  IMPRESSION: 1. Stable age related cerebral atrophy, ventriculomegaly and periventricular white matter disease. 2. No acute intracranial findings or skull fracture. 3. Degenerative cervical spondylosis but no acute cervical spine fracture.   Electronically Signed   By: Kalman Jewels M.D.   On: 09/09/2014 20:02   Ct Cervical Spine Wo Contrast  09/09/2014   CLINICAL DATA:  [place].  Hit head.  EXAM: CT HEAD WITHOUT CONTRAST  CT CERVICAL SPINE WITHOUT CONTRAST  TECHNIQUE: Multidetector CT imaging of the head and cervical spine was performed following the standard protocol without intravenous contrast. Multiplanar CT image reconstructions of the cervical spine were also generated.  COMPARISON:  08/26/2014  FINDINGS: CT HEAD FINDINGS  Stable age related cerebral atrophy, ventriculomegaly and periventricular white matter disease. No extra-axial  fluid collections are identified. No CT findings for acute hemispheric infarction or intracranial hemorrhage. No mass lesions. The brainstem and cerebellum are normal.  No acute skull fracture is identified. The paranasal sinuses and mastoid air cells are grossly clear. The globes are intact.  CT CERVICAL SPINE FINDINGS  Moderate degenerative cervical spondylosis with multilevel disc disease and facet disease. No acute bony findings. No abnormal prevertebral soft tissue swelling. The skullbase C1 and C1-2 articulations are maintained. Extensive carotid artery calcifications are noted. The lung apices are clear.  IMPRESSION: 1. Stable age related cerebral atrophy, ventriculomegaly and periventricular white matter disease. 2. No acute intracranial findings or skull fracture. 3. Degenerative cervical spondylosis but no acute cervical spine fracture.   Electronically Signed   By: Kalman Jewels M.D.   On: 09/09/2014 20:02   Dg Shoulder Left  09/09/2014   CLINICAL DATA:  Left shoulder pain.  Altered mental status.  EXAM: LEFT SHOULDER - 2+ VIEW  COMPARISON:  None.  FINDINGS: The joint spaces are maintained. No acute fractures identified. Mild AC joint degenerative changes are noted. The visualized left lung apex is clear. Dense carotid artery calcifications are noted on the left side.  IMPRESSION: Degenerative changes but no acute fracture.   Electronically Signed  By: Kalman Jewels M.D.   On: 09/09/2014 20:12    Scheduled Meds: . aspirin EC  81 mg Oral Daily  . citalopram  20 mg Oral Daily  . enoxaparin (LOVENOX) injection  30 mg Subcutaneous Q24H  . feeding supplement (ENSURE COMPLETE)  237 mL Oral BID BM  . folic acid  1 mg Oral Daily  . gabapentin  200 mg Oral TID  . insulin aspart  0-5 Units Subcutaneous QHS  . insulin aspart  0-9 Units Subcutaneous TID WC  . metoprolol  50 mg Oral BID  . sodium chloride  3 mL Intravenous Q12H  . tamsulosin  0.4 mg Oral BID  . thiamine  100 mg Oral Daily  .  zolpidem  10 mg Oral QHS   Continuous Infusions: . sodium chloride 125 mL/hr at 09/10/14 0101   Assessment and plan:  Principal Problem:   Acute encephalopathy Active Problems:   Mixed acid base balance disorder   Acute kidney injury   Acute respiratory failure with hypercapnia   Alcoholic dementia   FTT (failure to thrive) in adult   Hypertension   Recurrent falls   Diabetes mellitus with peripheral vascular disease   Peripheral vascular disease   Left shoulder pain   CAD in native artery   Protein-calorie malnutrition   1. Acute encephalopathy with associated failure to thrive, senile decline, recurrent falls, anorexia, and likely alcoholic dementia. This is the patient's second admission/hospitalization in 2 weeks for essentially the same. The patient appears to be failing to thrive at home and has had recurrent falls out of the bed as he is an amputee. Per his wife, he has not been eating or drinking and has just been sitting around and not engaging in life. I asked the patient if he had difficulty swallowing or pain with swallowing or abdominal pain and he stated no, but he does have some difficulty chewing from a lack of teeth. His exam is unremarkable. He does not appear to be encephalopathic currently, but I do not doubt that he may have underlying alcoholic dementia. CT scan of his head reveals atrophy but no active disease. Ammonia level was within normal limits. Vitamin B12 level and TSH were within normal limits during the previous hospitalization. We'll continue thiamine and supportive treatment. The patient would benefit from short-term skilled nursing facility placement as his wife appears to be somewhat overwhelmed. Will order PT/OT consultation; case manager consult/ social work consult. Check urinalysis pending.  Protein calorie malnutrition. We'll change his diet to dysphagia 2 to allow for better tolerance. Will add Ensure supplement.  Mixed acidosis: Mild  respiratory acidosis with primary metabolic acidosis. This is likely starvation acidosis and/or acute renal failure and/or metformin induced although his lactic acid level was within normal limits. The mild respiratory acidosis is likely from atelectasis, but has resolved, status post BiPAP short-term. We'll continue IV fluid hydration.  Acute renal failure/acute kidney injury. His last creatinine was 1.02 during the previous hospitalization. This is likely prerenal from hypovolemia. We'll continue IV fluid hydration. Continue to follow his renal function.  Hypertension/CAD/history of CABG. We'll continue aspirin and beta blocker. Troponin I negative 3.  Diabetes mellitus with peripheral vascular disease and peripheral neuropathy. Metformin is being withheld. We'll treat his diabetes with sliding scale NovoLog. We'll continue gabapentin. We'll order hemoglobin A1c.  Left shoulder pain, secondary to fall. No fracture seen on x-ray. Will treat pain with analgesics as needed and topical Voltaren.  Constipation, per history. We'll order soapsuds enema  and daily Senokot S.   Time spent: 50 minutes including conversation with wife.    [facility] Pager [phone]. If 7PM-7AM, please contact night-coverage at [email or web address], password [facility] 09/10/2014, 9:42 AM  LOS: 1 day

## 2014-09-10 NOTE — Care Management Note (Addendum)
    Page 1 of 1   09/12/2014     2:22:17 PM CARE MANAGEMENT NOTE 09/12/2014  Patient:  [name], [name]   Account Number:  [id number]  Date Initiated:  09/10/2014  Documentation initiated by:  Jolene Provost  Subjective/Objective Assessment:   Pt admitted for AMS and falls at home. Pt lives with wife and says he is not independent but gets help from wife with ADL's. Pt uses a cane and walker at home. Pt does have a PCP, Dr. Orson Ape.     Action/Plan:   Pt plans to return home at discharge.   PT eval is pending. Will continue to follow for CM needs.   Anticipated DC Date:  09/12/2014   Anticipated DC Plan:  [place] referral  Clinical Social Worker      DC Planning Services  CM consult      Choice offered to / List presented to:             Status of service:  Completed, signed off Medicare Important Message given?  YES (If response is "NO", the following Medicare IM given date fields will be blank) Date Medicare IM given:  09/12/2014 Medicare IM given by:  Jolene Provost Date Additional Medicare IM given:   Additional Medicare IM given by:    Discharge Disposition:  [place]  Per UR Regulation:    If discussed at Long Length of Stay Meetings, dates discussed:    Comments:  09/12/2014 [place], RN, MSN, [facility] Plans for [facility] over weekend. CSW aware of discharge plan and will arrange for placement. Pt has no CM needs at this time.  09/11/2014 [place], RN, MSN, [facility] CM discussed discharge options with wife, pt's wife feels she can no longer take care of pt as home by herself. Options given for [facility] services, DME's and private duty care providers. Per families request, pt now planning to discharge to SNF. Options have been discussed with [place] (wife) and son. CSW aware of discharge plan and will arrange for placement at SNF. Family made aware that SNF may not be covered by insurance and may have to  be pain out of pocket. Will continue to follow.  09/10/2014 [place], RN, MSN, [facility]

## 2014-09-10 NOTE — Care Management Utilization Note (Signed)
UR completed 

## 2014-09-10 NOTE — Progress Notes (Signed)
Pt agitated and confused. Has removed telemetry leads and, BP cuff, and pulse oximetry. Constantly picking at gown which has been partially removed.

## 2014-09-11 DIAGNOSIS — N390 Urinary tract infection, site not specified: Secondary | ICD-10-CM

## 2014-09-11 LAB — BASIC METABOLIC PANEL
ANION GAP: 5 (ref 5–15)
BUN: 24 mg/dL — ABNORMAL HIGH (ref 6–23)
CALCIUM: 8.1 mg/dL — AB (ref 8.4–10.5)
CO2: 21 mmol/L (ref 19–32)
Chloride: 112 mEq/L (ref 96–112)
Creatinine, Ser: 1.27 mg/dL (ref 0.50–1.35)
GFR calc non Af Amer: 55 mL/min — ABNORMAL LOW (ref >90)
GFR, EST AFRICAN AMERICAN: 64 mL/min — AB (ref >90)
Glucose, Bld: 95 mg/dL (ref 70–99)
POTASSIUM: 4.7 mmol/L (ref 3.5–5.1)
SODIUM: 138 mmol/L (ref 135–145)

## 2014-09-11 LAB — CALCIUM / CREATININE RATIO, URINE
Calcium, Ur: 1 mg/dL
Calcium/Creat.Ratio: 0
Creatinine, Urine: 115.3 mg/dL

## 2014-09-11 LAB — CBC
HEMATOCRIT: 30.7 % — AB (ref 39.0–52.0)
Hemoglobin: 9.9 g/dL — ABNORMAL LOW (ref 13.0–17.0)
MCH: 32.7 pg (ref 26.0–34.0)
MCHC: 32.2 g/dL (ref 30.0–36.0)
MCV: 101.3 fL — AB (ref 78.0–100.0)
Platelets: 292 10*3/uL (ref 150–400)
RBC: 3.03 MIL/uL — AB (ref 4.22–5.81)
RDW: 14.1 % (ref 11.5–15.5)
WBC: 5.3 10*3/uL (ref 4.0–10.5)

## 2014-09-11 LAB — GLUCOSE, CAPILLARY
GLUCOSE-CAPILLARY: 78 mg/dL (ref 70–99)
Glucose-Capillary: 99 mg/dL (ref 70–99)
Glucose-Capillary: 113 mg/dL — ABNORMAL HIGH (ref 70–99)
Glucose-Capillary: 133 mg/dL — ABNORMAL HIGH (ref 70–99)

## 2014-09-11 LAB — HEMOGLOBIN A1C
Hgb A1c MFr Bld: 5.4 % (ref <5.7)
Mean Plasma Glucose: 108 mg/dL (ref <117)

## 2014-09-11 MED ORDER — MORPHINE SULFATE 2 MG/ML IJ SOLN
2.0000 mg | Freq: Once | INTRAMUSCULAR | Status: AC
Start: 2014-09-11 — End: 2014-09-11
  Administered 2014-09-11: 2 mg via INTRAVENOUS
  Filled 2014-09-11: qty 1

## 2014-09-11 MED ORDER — CIPROFLOXACIN IN D5W 400 MG/200ML IV SOLN
400.0000 mg | Freq: Two times a day (BID) | INTRAVENOUS | Status: DC
  Administered 2014-09-11 – 2014-09-15 (×9): 400 mg via INTRAVENOUS
  Filled 2014-09-11 (×9): qty 200

## 2014-09-11 MED ORDER — DICLOFENAC SODIUM 1 % TD GEL
TRANSDERMAL | Status: AC
  Filled 2014-09-11: qty 100

## 2014-09-11 MED ORDER — CETYLPYRIDINIUM CHLORIDE 0.05 % MT LIQD
7.0000 mL | Freq: Two times a day (BID) | OROMUCOSAL | Status: DC
  Administered 2014-09-11 – 2014-09-15 (×8): 7 mL via OROMUCOSAL

## 2014-09-11 MED ORDER — TAB-A-VITE/IRON PO TABS
1.0000 | ORAL_TABLET | Freq: Every day | ORAL | Status: DC
  Administered 2014-09-12 – 2014-09-15 (×4): 1 via ORAL
  Filled 2014-09-11 (×7): qty 1

## 2014-09-11 MED ORDER — LORAZEPAM 2 MG/ML IJ SOLN
2.0000 mg | Freq: Once | INTRAMUSCULAR | Status: AC
  Administered 2014-09-11: 2 mg via INTRAVENOUS
  Filled 2014-09-11: qty 1

## 2014-09-11 NOTE — Evaluation (Signed)
Occupational Therapy Evaluation Patient Details Name: [name] MRN: [id number] DOB: 10/21/1940 Today's Date: 09/11/2014    History of Present Illness Acute encephalopathy with associated failure to thrive, senile decline, recurrent falls, anorexia, and likely alcoholic dementia. This is the patient's second admission/hospitalization in 2 weeks for essentially the same. The patient appears to be failing to thrive at home and has had recurrent falls out of the bed as he is an amputee. Per his wife, he has not been eating or drinking and has just been sitting around and not engaging in life. I asked the patient if he had difficulty swallowing or pain with swallowing or abdominal pain and he stated no, but he does have some difficulty chewing from a lack of teeth. His exam is unremarkable.   Clinical Impression   Pt presenting to acute OT with above situation.  Pt had minimal engagement in OT session.  Is only oriented to self at this time.  Per PT report from wife, at baseline pt has decreased engagement in ADLs at baseline.  At this time, pt's cognitive status is limiting ability to engage in OT sessions and ADL tasks.  Unles cognitive status improves, pt may not benefit from OT services.  Will continue to monitor pt's status while in hospital for improvement sin cognitive status.    Follow Up Recommendations  Other (comment) (to be determined based on progression of pt's cognitive status.  Pt unable to follow commands at this time for participation in therapy.)    Equipment Recommendations  Other (comment) (current equipement unkown.)    Recommendations for Other Services       Precautions / Restrictions Precautions Precautions: Fall Precaution Comments: fallen out of bed 2x in last weeks requiring MES for help.      Mobility Bed Mobility                  Transfers                      Balance                                            ADL  Overall ADL's : Needs assistance/impaired                                       General ADL Comments: Baseline unkown at this time.  Max assist likely with all ADLs.     Vision                     Perception     Praxis      Pertinent Vitals/Pain Pain Assessment: Faces Faces Pain Scale: Hurts little more     Hand Dominance     Extremity/Trunk Assessment Upper Extremity Assessment Upper Extremity Assessment: Generalized weakness;Difficult to assess due to impaired cognition (with with minimal abiltiy to engage in UE testing)   Lower Extremity Assessment Lower Extremity Assessment: Defer to PT evaluation       Communication Communication Communication: Expressive difficulties   Cognition Arousal/Alertness: Awake/alert Behavior During Therapy: Flat affect Overall Cognitive Status: No family/caregiver present to determine baseline cognitive functioning Area of Impairment: Orientation;Attention;Memory;Following commands;Awareness Orientation Level: Disoriented to;Place;Time;Situation     Following Commands: Follows one step commands inconsistently  General Comments       Exercises       Shoulder Instructions      Home Living Family/patient expects to be discharged to:: Private residence Living Arrangements: Spouse/significant other Available Help at Discharge: Family Type of Home: House Home Access: Level entry     Home Layout: One [place]: Shower seat;Wheelchair - Education officer, community - power;Toilet riser          Prior Functioning/Environment Level of Independence: Needs assistance        Comments: Wife not avaialble during OT evaluation, and pt unable to provide insight into needs.    OT Diagnosis: Generalized weakness;Altered mental status   OT Problem List: Decreased strength;Decreased range of motion;Decreased activity tolerance;Decreased coordination;Impaired UE functional  use;Decreased cognition   OT Treatment/Interventions: Self-care/ADL training;DME and/or AE instruction;Therapeutic activities;Cognitive remediation/compensation;Therapeutic exercise;Visual/perceptual remediation/compensation;Patient/family education    OT Goals(Current goals can be found in the care plan section) Acute Rehab OT Goals Patient Stated Goal: to go home OT Goal Formulation: With patient Time For Goal Achievement: 09/25/14 Potential to Achieve Goals: Fair ADL Goals Pt Will Perform Eating: with min assist Pt Will Perform Grooming: with mod assist Pt Will Transfer to Toilet: with +2 assist;with mod assist  OT Frequency: Min 2X/week   Barriers to D/C:            Co-evaluation              End of Session    Activity Tolerance: Patient limited by lethargy Patient left: in bed;with bed alarm set   Time: 1150-1158 OT Time Calculation (min): 8 min Charges:  OT General Charges $OT Visit: 1 Procedure OT Evaluation $Initial OT Evaluation Tier I: 1 Procedure G-Codes:     Bea Graff Yardley Beltran, MS, OTR/L [place] [phone] 09/11/2014, 3:45 PM

## 2014-09-11 NOTE — Progress Notes (Deleted)
@  0100 pt  Awake and confused and increasingly hostile to staff trying to provide care, FLACC 8, and increased agitation , kicking, cursing, prn pain med and prn valium not due until 0142 but 30 min early can give at 0112, will cont to monitor

## 2014-09-11 NOTE — Progress Notes (Addendum)
[facility] PROGRESS NOTE  [name] JKK:[id number] DOB: [birth year] DOA: 09/09/2014 PCP: Leonides Grills, MD    Code Status: Full code Family Communication: Discussed with wife; on 09/10/14  Disposition Plan: Discharge when clinically appropriate: Home with home health versus short-term skilled nursing facility placement.   Consultants:  None  Procedures:  None  Antibiotics:  Cipro 09/11/14>  HPI/Subjective: It is noted last evening and overnight for the patient's agitation and worsening delirium, consistent with sundowning. Medications given with some success. Currently, the patient is laying in bed with mittens on his hands and he is trying to take them off. He denies nausea, vomiting, abdominal pain, or chest pain. He is alert to himself in the hospital.   Objective: Filed Vitals:   09/11/14 0748  BP: 132/75  Pulse: 84  Temp:   Resp:    temperature 97.5. Oxygen saturation 100% on room air. Respiratory rate 13.  Intake/Output Summary (Last 24 hours) at 09/11/14 0944 Last data filed at 09/11/14 0600  Gross per 24 hour  Intake   2915 ml  Output   1025 ml  Net   1890 ml   Filed Weights   09/09/14 2217 09/10/14 0500 09/11/14 0500  Weight: 89.9 kg (198 lb 3.1 oz) 89.9 kg (198 lb 3.1 oz) 88.4 kg (194 lb 14.2 oz)    Exam:   General:  Alert 74 year old man laying in bed, in no acute distress.  Oropharynx with multiple missing teeth and poor dentition; mucous membranes are now moist.  Cardiovascular: Distant S1, S2, with soft systolic murmur.  Respiratory: Clear anteriorly with decreased breath sounds in the bases. Breathing is nonlabored.  Abdomen: Hypoactive bowel sounds, soft, nontender, nondistended.  Musculoskeletal/extremities: Moderate tenderness of the left shoulder with decrease in range of motion secondary to pain; mild warmth but no fluctuance; left BKA stump intact with no edema; right foot with mild edema and erythema and mild tenderness  around ankle; no right calf tenderness or edema.  Neurologic/psychiatric:  He is alert and oriented to himself and place. He does not recall being confused or agitated overnight. He is trying to take off the mittens on his hands. Cranial nerves II through XII are grossly intact.   Data Reviewed: Basic Metabolic Panel:  Recent Labs Lab 09/09/14 2006 09/10/14 1038 09/11/14 0401  NA 134* 136 138  K 5.0 4.8 4.7  CL 104 107 112  CO2 21 24 21   GLUCOSE 93 93 95  BUN 34* 28* 24*  CREATININE 2.91* 1.74* 1.27  CALCIUM 8.4 8.2* 8.1*   Liver Function Tests:  Recent Labs Lab 09/09/14 2006  AST 16  ALT 11  ALKPHOS 60  BILITOT 0.6  PROT 6.7  ALBUMIN 2.8*   No results for input(s): LIPASE, AMYLASE in the last 168 hours.  Recent Labs Lab 09/09/14 2006  AMMONIA 19   CBC:  Recent Labs Lab 09/09/14 2006 09/10/14 1038 09/11/14 0401  WBC 6.4 6.6 5.3  NEUTROABS 5.1  --   --   HGB 11.2* 11.0* 9.9*  HCT 35.5* 35.1* 30.7*  MCV 103.8* 103.8* 101.3*  PLT 330 322 292   Cardiac Enzymes:  Recent Labs Lab 09/09/14 2006 09/09/14 2217 09/10/14 0447 09/10/14 1038  TROPONINI 0.03 0.03 0.03 0.03   BNP (last 3 results) No results for input(s): PROBNP in the last 8760 hours. CBG:  Recent Labs Lab 09/09/14 2042 09/10/14 1236 09/10/14 1633 09/10/14 2103 09/11/14 0727  GLUCAP 87 118* 102* 97 78    Recent Results (from the  past 240 hour(s))  MRSA PCR Screening     Status: None   Collection Time: 09/09/14 10:15 PM  Result Value Ref Range Status   MRSA by PCR NEGATIVE NEGATIVE Final    Comment:        The GeneXpert MRSA Assay (FDA approved for NASAL specimens only), is one component of a comprehensive MRSA colonization surveillance program. It is not intended to diagnose MRSA infection nor to guide or monitor treatment for MRSA infections.      Studies: Dg Chest 1 View  09/09/2014   CLINICAL DATA:  Recent false, chest pain, initial encounter  EXAM: CHEST - 1 VIEW   COMPARISON:  08/26/2014  FINDINGS: Cardiac shadow is stable. Postsurgical changes are again seen. The lungs are well aerated bilaterally. Mild interstitial changes are again identified. No focal infiltrate is noted.  IMPRESSION: No acute abnormality seen.   Electronically Signed   By: Inez Catalina M.D.   On: 09/09/2014 19:58   Ct Head Wo Contrast  09/09/2014   CLINICAL DATA:  [place].  Hit head.  EXAM: CT HEAD WITHOUT CONTRAST  CT CERVICAL SPINE WITHOUT CONTRAST  TECHNIQUE: Multidetector CT imaging of the head and cervical spine was performed following the standard protocol without intravenous contrast. Multiplanar CT image reconstructions of the cervical spine were also generated.  COMPARISON:  08/26/2014  FINDINGS: CT HEAD FINDINGS  Stable age related cerebral atrophy, ventriculomegaly and periventricular white matter disease. No extra-axial fluid collections are identified. No CT findings for acute hemispheric infarction or intracranial hemorrhage. No mass lesions. The brainstem and cerebellum are normal.  No acute skull fracture is identified. The paranasal sinuses and mastoid air cells are grossly clear. The globes are intact.  CT CERVICAL SPINE FINDINGS  Moderate degenerative cervical spondylosis with multilevel disc disease and facet disease. No acute bony findings. No abnormal prevertebral soft tissue swelling. The skullbase C1 and C1-2 articulations are maintained. Extensive carotid artery calcifications are noted. The lung apices are clear.  IMPRESSION: 1. Stable age related cerebral atrophy, ventriculomegaly and periventricular white matter disease. 2. No acute intracranial findings or skull fracture. 3. Degenerative cervical spondylosis but no acute cervical spine fracture.   Electronically Signed   By: Kalman Jewels M.D.   On: 09/09/2014 20:02   Ct Cervical Spine Wo Contrast  09/09/2014   CLINICAL DATA:  [place].  Hit head.  EXAM: CT HEAD WITHOUT CONTRAST  CT CERVICAL SPINE WITHOUT CONTRAST  TECHNIQUE:  Multidetector CT imaging of the head and cervical spine was performed following the standard protocol without intravenous contrast. Multiplanar CT image reconstructions of the cervical spine were also generated.  COMPARISON:  08/26/2014  FINDINGS: CT HEAD FINDINGS  Stable age related cerebral atrophy, ventriculomegaly and periventricular white matter disease. No extra-axial fluid collections are identified. No CT findings for acute hemispheric infarction or intracranial hemorrhage. No mass lesions. The brainstem and cerebellum are normal.  No acute skull fracture is identified. The paranasal sinuses and mastoid air cells are grossly clear. The globes are intact.  CT CERVICAL SPINE FINDINGS  Moderate degenerative cervical spondylosis with multilevel disc disease and facet disease. No acute bony findings. No abnormal prevertebral soft tissue swelling. The skullbase C1 and C1-2 articulations are maintained. Extensive carotid artery calcifications are noted. The lung apices are clear.  IMPRESSION: 1. Stable age related cerebral atrophy, ventriculomegaly and periventricular white matter disease. 2. No acute intracranial findings or skull fracture. 3. Degenerative cervical spondylosis but no acute cervical spine fracture.   Electronically Signed  By: Kalman Jewels M.D.   On: 09/09/2014 20:02   US Renal  09/10/2014   CLINICAL DATA:  Acute renal failure, status post left nephrectomy  EXAM: RENAL/URINARY TRACT ULTRASOUND COMPLETE  COMPARISON:  09/01/2010  FINDINGS: Right Kidney:  Length: 12.4 cm. Echogenicity within normal limits. No mass or hydronephrosis visualized.  Left Kidney:  Surgically removed.  Bladder:  Appears normal for degree of bladder distention. Dependent echogenic material is noted within the bladder. This could be debris related to a UTI or possible hemorrhage. Clinical correlation is recommended.  IMPRESSION: Status post left nephrectomy.  Normal-appearing right kidney.  Dependent debris within the  bladder as described.   Electronically Signed   By: Inez Catalina M.D.   On: 09/10/2014 14:34   Dg Shoulder Left  09/09/2014   CLINICAL DATA:  Left shoulder pain.  Altered mental status.  EXAM: LEFT SHOULDER - 2+ VIEW  COMPARISON:  None.  FINDINGS: The joint spaces are maintained. No acute fractures identified. Mild AC joint degenerative changes are noted. The visualized left lung apex is clear. Dense carotid artery calcifications are noted on the left side.  IMPRESSION: Degenerative changes but no acute fracture.   Electronically Signed   By: Kalman Jewels M.D.   On: 09/09/2014 20:12    Scheduled Meds: . aspirin EC  81 mg Oral Daily  . ciprofloxacin  400 mg Intravenous Q12H  . citalopram  20 mg Oral Daily  . diclofenac sodium  2 g Topical TID  . famotidine  20 mg Oral Daily  . feeding supplement (ENSURE COMPLETE)  237 mL Oral BID BM  . gabapentin  200 mg Oral TID  . heparin subcutaneous  5,000 Units Subcutaneous 3 times per day  . insulin aspart  0-5 Units Subcutaneous QHS  . insulin aspart  0-9 Units Subcutaneous TID WC  . metoprolol  50 mg Oral BID  . senna-docusate  1 tablet Oral QHS  . sodium chloride  3 mL Intravenous Q12H  . tamsulosin  0.4 mg Oral BID  . thiamine  100 mg Oral Daily  . zolpidem  10 mg Oral QHS   Continuous Infusions: . sodium chloride 125 mL/hr at 09/11/14 0600   Assessment and plan:  Principal Problem:   Acute encephalopathy Active Problems:   Mixed acid base balance disorder   Acute kidney injury   Acute respiratory failure with hypercapnia   Alcoholic dementia   FTT (failure to thrive) in adult   Acute UTI   Hypertension   Recurrent falls   Diabetes mellitus with peripheral vascular disease   Peripheral vascular disease   Left shoulder pain   CAD in native artery   Protein-calorie malnutrition   1. Acute metabolic encephalopathy with with behavioral disturbances, associated with UTI, failure to thrive, senile decline, recurrent falls,  anorexia, and likely alcoholic dementia. His symptomatology overnight is consistent with sundowning. When necessary Haldol and diazepam started. Will continue. We'll add daily at bedtime Seroquel. This is the patient's second admission/hospitalization in 2 weeks for essentially the same. The patient appears to be failing to thrive at home and has had recurrent falls out of the bed as he is an amputee. Per his wife, he has not been eating or drinking and has just been sitting around and not engaging in life. I asked the patient if he had difficulty swallowing or pain with swallowing or abdominal pain and he stated no, but he does have some difficulty chewing from a lack of teeth. CT scan of  his head revealed atrophy but no active disease. Ammonia level was within normal limits. Vitamin B12 level and TSH were within normal limits during the previous hospitalization. We'll continue thiamine and supportive treatment. The patient would benefit from short-term skilled nursing facility placement as his wife appears to be somewhat overwhelmed. Physical therapy consult noted and home health PT was recommended, but he may benefit from skilled nursing facility placement.  Urinary tract infection, per urinalysis. Cipro started. Urine culture ordered and is pending.  Severe protein calorie malnutrition. We'll continue dysphagia 2 diet for better tolerance given multiple missing teeth. Continue Ensure  Mixed acidosis: Mild respiratory acidosis with primary metabolic acidosis. Clinically this is resolving. His CO2 is within normal limits. We'll hold off on further assessments with ABGs. This was likely starvation acidosis and/or acute renal failure and/or metformin induced although his lactic acid level was within normal limits. The mild respiratory acidosis was likely from atelectasis, but has resolved, status post BiPAP short-term. We'll continue IV fluid hydration, but decrease the rate and encouragement  of by mouth intake.  Acute renal failure/acute kidney injury.  His last creatinine was 1.02 during the previous hospitalization. This is likely prerenal from hypovolemia. His creatinine is approaching baseline with IV fluid hydration. We'll continue IV fluid hydration.  Hypertension/CAD/history of CABG. We'll continue aspirin and beta blocker. Troponin I negative 3.  Diabetes mellitus with peripheral vascular disease and peripheral neuropathy. Metformin is being withheld. We'll treat his diabetes with sliding scale NovoLog. We'll continue gabapentin. We'll order hemoglobin A1c.  Anemia. Likely chronic disease and in part dilutional from IV fluids. Will add multivitamin with iron. We'll continue to monitor. Will decrease IV fluid rate.  Left shoulder pain, secondary to fall. No fracture seen on x-ray. Continue analgesics as needed and topical Voltaren.  Constipation, per history. He had a large bowel movement this morning. Status post soapsuds enema. Continue Senokot S   Time spent: 35 minutes    [facility] Pager [phone]. If 7PM-7AM, please contact night-coverage at [email or web address], password [facility] 09/11/2014, 9:44 AM  LOS: 2 days

## 2014-09-11 NOTE — Clinical Documentation Improvement (Signed)
Please specify the type of encephalopathy in your progress notes:  --Encephalopathy Alcoholic Anoxic/hypoxic Drug-induced/toxic (specify drug) Hepatic Hypertensive Metabolic/septic Traumatic/post-concussion Wernicke Other (specify) . Document any associated diagnoses/conditions: acute renal failure, h/o ETOH abuse,  Supporting Information: per providers  notes    "Acute encephalopathy with associated failure to thrive, senile decline, recurrent falls, anorexia, and likely alcoholic dementia Mixed acidosis: Mild respiratory acidosis with primary metabolic acidosis."  Nursing notes: "very uncomfortably and very restless and agitated;  confused and increasing hostile to staff trying to provide care, FLACC 8 and increased agitation,and trying to remove safety mitts; swatting, kicking, increased hostility and trying to get OOB  Thank You, Melvia Heaps, RN, BSN, CDI # [phone] [email or web address]

## 2014-09-11 NOTE — Progress Notes (Addendum)
Prn pain med and prn valium pulled, scanned and presented to pt, pt refused to take the medication, swatting, kicking, increased hostility and trying to get OOB, will page MD to see if other medication interventions can be ordered Refused po meds crushed and rinsed down med room sink and witnessed by JD, RN

## 2014-09-11 NOTE — Progress Notes (Signed)
Jordana the [facility] came up and re-inserted IV for pt into opposite arm. Took 3 staff to restrain pt during insertion, to avoid injury to the pt as well as the staff. IV site now currently wrapped in several layers of gauze, and not too tightly. Will continue to monitor

## 2014-09-11 NOTE — Progress Notes (Addendum)
Pt resting when arrived from ICU. Around 5pm pt began screaming various people's names and that he "needed to go to the basement to get his guns". Pt continues to try to pull hand mitts off and climb out of bed. Bed in lowest position and bed alarm on. Valium and Haldol given. Pt also has bed mats placed on both sides of his bed on the floor. Pt has ripped out his IV. Charge nurse and [facility] notified of his behavior and AC will be up shortly to attempt IV insertion. Will continue to monitor

## 2014-09-11 NOTE — Clinical Social Work Psychosocial (Signed)
Clinical Social Work Department BRIEF PSYCHOSOCIAL ASSESSMENT 09/11/2014  Patient:  [name], [name]     Account Number:  [id number]     Admit date:  09/09/2014  Clinical Social Worker:  Legrand Como  Date/Time:  09/11/2014 11:49 AM  Referred by:  CSW  Date Referred:  09/11/2014 Referred for  SNF Placement   Other Referral:   Interview type:  Family Other interview type:   [name], wife    PSYCHOSOCIAL DATA Living Status:  FAMILY Admitted from facility:   Level of care:   Primary support name:  [name] Primary support relationship to patient:  SPOUSE Degree of support available:   Wife is very supportive.  Sons are supportive, however they live in [place].    CURRENT CONCERNS Current Concerns  Post-Acute Placement   Other Concerns:    SOCIAL WORK ASSESSMENT / PLAN CSW could not interview patient as he was not currently fully oriented.  CSW spoke to patient's wife, [name].  Mrs. Knipple indicated that she is having a very difficult time managing patient's care in the home.  She indicated that patient has no bladder control and that she is changing sheets for patient every night.  She indicated that patient takes 2-3 Imodium pills at a time then takes stool softeners because he cannot have a bowel movement. She indicated that he has a cycle of this behavior despite his PCP advising him not do continue that pattern.  She stated that patient uses a power wheelchair.  Mrs. [place] indicated that she has to lift patient into her wheelchair, shower, and basically perform all his lifts for patient. She indicated prior to the hospitalization, patient fell three times during the night while trying to use the urinal. Mrs. Brannan stated that while she has bed rails on the side of the bed at home she does not have rails on the bottom.  She stated that she is patient's only support as they have no family here to help. Mrs. Clowers indicated that she would like for patient to  go to SNF. CSW advised Mrs. Schaum that patient's SNF would likely be private pay as his PT evaluation did not recommend SNF. CSW advised that if Medicare paid anything it could possibly be only for minimal days or no days at all.  CSW advised that SNF stays can range around [phone]  or more per month as private pay.  Mrs. Vanbergen indicated that she understood, however she would take the SNF placement for a many days as they may pay.  She indicated that she would like for patient's clinicals to be faxed out to [facility] and [facility].   Assessment/plan status:   Other assessment/ plan:   Information/referral to community resources:    PATIENT'S/FAMILY'S RESPONSE TO PLAN OF CARE: Patient's wife desires SNF placement at [facility] or [facility].   [name], [name]

## 2014-09-11 NOTE — Clinical Documentation Improvement (Signed)
Please indicate degree of Protein-calorie malnutrition.  . Severity: --Mild (first degree) --Moderate (second degree) --Severe (third degree) . Avoid documenting a range of severity, such as "moderate to severe" . Document any associated diagnoses/conditions: h/o chronic alcoholism, FTT (failure to thrive) in adult, Protein-calorie malnutrition  Supporting Information:  Providers note: Functional status: last several days patient is primarily been sitting in his chair. He rarely moves out of the chair;  does not have any upper teeth, at rest he has his lower mandible protruding ;  exhibits abnormal muscle tone He says that he just doesn't feel like eating, have some difficulty chewing because of the lack of teeth, Oropharynx with multiple missing teeth and poor dentition; mucous membranes are dry; Treatment: dysphagia 2 diet, add Ensure supplement  Thank You, Melvia Heaps, RN, BSN, CDI # [phone] [facility]

## 2014-09-11 NOTE — Clinical Social Work Placement (Signed)
Clinical Social Work Department CLINICAL SOCIAL WORK PLACEMENT NOTE 09/11/2014  Patient:  [name], [name]  Account Number:  [id number] Admit date:  09/09/2014  Clinical Social Worker:  Ambrose Pancoast, LCSW  Date/time:  09/11/2014 12:18 PM  Clinical Social Work is seeking post-discharge placement for this patient at the following level of care:   SKILLED NURSING   (*CSW will update this form in Epic as items are completed)   09/11/2014  Patient/family provided with [place] Department of Clinical Social Work's list of facilities offering this level of care within the geographic area requested by the patient (or if unable, by the patient's family).  09/11/2014  Patient/family informed of their freedom to choose among providers that offer the needed level of care, that participate in Medicare, Medicaid or managed care program needed by the patient, have an available bed and are willing to accept the patient.    Patient/family informed of [facility]' ownership interest in [facility], as well as of the fact that they are under no obligation to receive care at this facility.  PASARR submitted to EDS on 09/11/2014 PASARR number received on 09/11/2014  FL2 transmitted to all facilities in geographic area requested by pt/family on  09/11/2014 FL2 transmitted to all facilities within larger geographic area on   Patient informed that his/her managed care company has contracts with or will negotiate with  certain facilities, including the following:     Patient/family informed of bed offers received:   Patient chooses bed at  Physician recommends and patient chooses bed at    Patient to be transferred to  on   Patient to be transferred to facility by  Patient and family notified of transfer on  Name of family member notified:    The following physician request were entered in Epic:   Additional Comments:  Ambrose Pancoast, [place]

## 2014-09-11 NOTE — Progress Notes (Signed)
Pt awake and confused and increasing hostile to staff trying to provide care, FLACC 8 and increased agitation,and  trying to remove safety mitts,  prn pain med and prn valium not due until 0142 but 63min early can give at 0112, will cont to monitor

## 2014-09-12 ENCOUNTER — Inpatient Hospital Stay (HOSPITAL_COMMUNITY): Payer: Medicare Other

## 2014-09-12 LAB — CBC
HCT: 31.1 % — ABNORMAL LOW (ref 39.0–52.0)
Hemoglobin: 10.2 g/dL — ABNORMAL LOW (ref 13.0–17.0)
MCH: 32.3 pg (ref 26.0–34.0)
MCHC: 32.8 g/dL (ref 30.0–36.0)
MCV: 98.4 fL (ref 78.0–100.0)
Platelets: 348 10*3/uL (ref 150–400)
RBC: 3.16 MIL/uL — AB (ref 4.22–5.81)
RDW: 14 % (ref 11.5–15.5)
WBC: 5.3 10*3/uL (ref 4.0–10.5)

## 2014-09-12 LAB — GLUCOSE, CAPILLARY
GLUCOSE-CAPILLARY: 150 mg/dL — AB (ref 70–99)
Glucose-Capillary: 97 mg/dL (ref 70–99)
Glucose-Capillary: 110 mg/dL — ABNORMAL HIGH (ref 70–99)
Glucose-Capillary: 99 mg/dL (ref 70–99)

## 2014-09-12 LAB — BASIC METABOLIC PANEL
ANION GAP: 7 (ref 5–15)
BUN: 13 mg/dL (ref 6–23)
CHLORIDE: 108 meq/L (ref 96–112)
CO2: 21 mmol/L (ref 19–32)
Calcium: 8.1 mg/dL — ABNORMAL LOW (ref 8.4–10.5)
Creatinine, Ser: 1.03 mg/dL (ref 0.50–1.35)
GFR calc Af Amer: 82 mL/min — ABNORMAL LOW (ref >90)
GFR calc non Af Amer: 71 mL/min — ABNORMAL LOW (ref >90)
Glucose, Bld: 98 mg/dL (ref 70–99)
Potassium: 4.1 mmol/L (ref 3.5–5.1)
Sodium: 136 mmol/L (ref 135–145)

## 2014-09-12 MED ORDER — DIAZEPAM 5 MG PO TABS
5.0000 mg | ORAL_TABLET | Freq: Four times a day (QID) | ORAL | Status: DC | PRN
  Administered 2014-09-14: 5 mg via ORAL
  Filled 2014-09-12: qty 1

## 2014-09-12 MED ORDER — TRAZODONE HCL 50 MG PO TABS
50.0000 mg | ORAL_TABLET | Freq: Every day | ORAL | Status: DC
  Administered 2014-09-12 – 2014-09-14 (×3): 50 mg via ORAL
  Filled 2014-09-12 (×3): qty 1

## 2014-09-12 MED ORDER — OLANZAPINE 5 MG PO TABS
2.5000 mg | ORAL_TABLET | Freq: Every day | ORAL | Status: DC
  Administered 2014-09-12 – 2014-09-14 (×3): 2.5 mg via ORAL
  Filled 2014-09-12 (×3): qty 1

## 2014-09-12 MED ORDER — FUROSEMIDE 10 MG/ML IJ SOLN
20.0000 mg | Freq: Once | INTRAMUSCULAR | Status: AC
  Administered 2014-09-12: 20 mg via INTRAVENOUS
  Filled 2014-09-12: qty 2

## 2014-09-12 NOTE — Progress Notes (Signed)
[facility] PROGRESS NOTE  REASON HELZER QVZ:[id number] DOB: [birth year] DOA: 09/09/2014 PCP: Leonides Grills, MD    Code Status: Full code Family Communication: Discussed with wife; on 09/10/14  Disposition Plan: Discharge when clinically appropriate; probably to short-term skilled nursing facility.   Consultants:  None  Procedures:  None  Antibiotics:  Cipro 09/11/14>  HPI/Subjective: Patient continues to have some sundowning with agitation and confusion in the evening. He is more appropriate during the day. Currently, he is alert and oriented to himself and hospital and asking when he can go home. He has no complaints of chest pain or chest congestion or pain with urination.   Objective: Filed Vitals:   09/11/14 2230  BP: 160/97  Pulse: 82  Temp: 98 F (36.7 C)  Resp: 18    Intake/Output Summary (Last 24 hours) at 09/12/14 1252 Last data filed at 09/12/14 0900  Gross per 24 hour  Intake 2074.17 ml  Output    600 ml  Net 1474.17 ml   Filed Weights   09/10/14 0500 09/11/14 0500 09/12/14 0500  Weight: 89.9 kg (198 lb 3.1 oz) 88.4 kg (194 lb 14.2 oz) 89.7 kg (197 lb 12 oz)    Exam:   General:  Alert 74 year old man laying in bed, in no acute distress.  Oropharynx with multiple missing teeth and poor dentition; mucous membranes are now moist.  Cardiovascular: Distant S1, S2, with soft systolic murmur.  Respiratory: Few scattered rhonchi, partially cleared with coughing. Breathing nonlabored.  Abdomen: Hypoactive bowel sounds, soft, nontender, nondistended.  Musculoskeletal/extremities: Mild tenderness of the left shoulder with decrease in range of motion secondary to pain; mild warmth but no fluctuance; left BKA stump intact.  Neurologic/psychiatric:  He is alert and oriented to himself and place. He does not recall being confused or agitated overnight. He is trying to take off the mittens on his hands. Cranial nerves II through XII are grossly  intact.   Data Reviewed: Basic Metabolic Panel:  Recent Labs Lab 09/09/14 2006 09/10/14 1038 09/11/14 0401 09/12/14 0534  NA 134* 136 138 136  K 5.0 4.8 4.7 4.1  CL 104 107 112 108  CO2 21 24 21 21   GLUCOSE 93 93 95 98  BUN 34* 28* 24* 13  CREATININE 2.91* 1.74* 1.27 1.03  CALCIUM 8.4 8.2* 8.1* 8.1*   Liver Function Tests:  Recent Labs Lab 09/09/14 2006  AST 16  ALT 11  ALKPHOS 60  BILITOT 0.6  PROT 6.7  ALBUMIN 2.8*   No results for input(s): LIPASE, AMYLASE in the last 168 hours.  Recent Labs Lab 09/09/14 2006  AMMONIA 19   CBC:  Recent Labs Lab 09/09/14 2006 09/10/14 1038 09/11/14 0401 09/12/14 0534  WBC 6.4 6.6 5.3 5.3  NEUTROABS 5.1  --   --   --   HGB 11.2* 11.0* 9.9* 10.2*  HCT 35.5* 35.1* 30.7* 31.1*  MCV 103.8* 103.8* 101.3* 98.4  PLT 330 322 292 348   Cardiac Enzymes:  Recent Labs Lab 09/09/14 2006 09/09/14 2217 09/10/14 0447 09/10/14 1038  TROPONINI 0.03 0.03 0.03 0.03   BNP (last 3 results) No results for input(s): PROBNP in the last 8760 hours. CBG:  Recent Labs Lab 09/11/14 1130 09/11/14 1711 09/11/14 2236 09/12/14 0739 09/12/14 1133  GLUCAP 99 113* 133* 97 150*    Recent Results (from the past 240 hour(s))  MRSA PCR Screening     Status: None   Collection Time: 09/09/14 10:15 PM  Result Value Ref Range Status  MRSA by PCR NEGATIVE NEGATIVE Final    Comment:        The GeneXpert MRSA Assay (FDA approved for NASAL specimens only), is one component of a comprehensive MRSA colonization surveillance program. It is not intended to diagnose MRSA infection nor to guide or monitor treatment for MRSA infections.      Studies: US Renal  09/10/2014   CLINICAL DATA:  Acute renal failure, status post left nephrectomy  EXAM: RENAL/URINARY TRACT ULTRASOUND COMPLETE  COMPARISON:  09/01/2010  FINDINGS: Right Kidney:  Length: 12.4 cm. Echogenicity within normal limits. No mass or hydronephrosis visualized.  Left Kidney:   Surgically removed.  Bladder:  Appears normal for degree of bladder distention. Dependent echogenic material is noted within the bladder. This could be debris related to a UTI or possible hemorrhage. Clinical correlation is recommended.  IMPRESSION: Status post left nephrectomy.  Normal-appearing right kidney.  Dependent debris within the bladder as described.   Electronically Signed   By: Inez Catalina M.D.   On: 09/10/2014 14:34   Dg Chest Port 1 View  09/12/2014   CLINICAL DATA:  Chest congestion, dementia  EXAM: PORTABLE CHEST - 1 VIEW  COMPARISON:  09/09/2013  FINDINGS: Cardiomediastinal silhouette is stable. Central mild vascular congestion without pulmonary edema. No segmental infiltrate. Status post median sternotomy again noted.  IMPRESSION: No acute infiltrate or pleural effusion. Central mild vascular congestion without pulmonary edema. Again noted status post median sternotomy.   Electronically Signed   By: Lahoma Crocker M.D.   On: 09/12/2014 12:27    Scheduled Meds: . antiseptic oral rinse  7 mL Mouth Rinse BID  . aspirin EC  81 mg Oral Daily  . ciprofloxacin  400 mg Intravenous Q12H  . citalopram  20 mg Oral Daily  . diclofenac sodium  2 g Topical TID  . famotidine  20 mg Oral Daily  . feeding supplement (ENSURE COMPLETE)  237 mL Oral BID BM  . gabapentin  200 mg Oral TID  . heparin subcutaneous  5,000 Units Subcutaneous 3 times per day  . insulin aspart  0-5 Units Subcutaneous QHS  . insulin aspart  0-9 Units Subcutaneous TID WC  . metoprolol  50 mg Oral BID  . multivitamins with iron  1 tablet Oral Daily  . senna-docusate  1 tablet Oral QHS  . sodium chloride  3 mL Intravenous Q12H  . tamsulosin  0.4 mg Oral BID  . thiamine  100 mg Oral Daily  . zolpidem  10 mg Oral QHS   Continuous Infusions:   Assessment and plan:  Principal Problem:   Acute encephalopathy Active Problems:   Mixed acid base balance disorder   Acute kidney injury   Acute respiratory failure with  hypercapnia   Alcoholic dementia   FTT (failure to thrive) in adult   Acute UTI   Hypertension   Recurrent falls   Diabetes mellitus with peripheral vascular disease   Peripheral vascular disease   Left shoulder pain   CAD in native artery   Protein-calorie malnutrition   1. Acute metabolic encephalopathy with with behavioral disturbances, associated with UTI, failure to thrive, senile decline, recurrent falls, anorexia, and likely alcoholic dementia. His symptomatology overnight is consistent with sundowning. When necessary Haldol and diazepam started. Will continue. We'll add daily at bedtime Seroquel. This is the patient's second admission/hospitalization in 2 weeks for essentially the same. The patient appears to be failing to thrive at home and has had recurrent falls out of the bed as he  is an amputee. Per his wife, he has not been eating or drinking and has just been sitting around and not engaging in life. I asked the patient if he had difficulty swallowing or pain with swallowing or abdominal pain and he stated no, but he does have some difficulty chewing from a lack of teeth. CT scan of his head revealed atrophy but no active disease. Ammonia level was within normal limits. Vitamin B12 level and TSH were within normal limits during the previous hospitalization. We'll continue thiamine and supportive treatment. The patient would benefit from short-term skilled nursing facility placement as his wife appears to be somewhat overwhelmed. Physical therapy consult noted and home health PT was recommended, but his wife is requesting skilled nursing facility placement for which I agree.  Dementia with behavioral disturbances/sundowning. We'll continue when necessary Haldol and Valium. We'll add bedtime dosing of Zyprexa. We'll add trazodone at bedtime as well.  Pulmonary congestion without frank edema per chest x-ray. Patient has a few rhonchi on exam, cleared partially with  coughing. IV fluids have been saline locked. We'll give 1 dose of IV Lasix empirically given that he has had several days of IV fluid hydration.  Urinary tract infection, per urinalysis. Cipro started. Urine culture ordered and is pending.  Severe protein calorie malnutrition. We'll continue dysphagia 2 diet for better tolerance given multiple missing teeth. Continue Ensure  Mixed acidosis: Mild respiratory acidosis with primary metabolic acidosis. Clinically this is resolving. His CO2 is within normal limits. We'll hold off on further assessments with ABGs. This was likely starvation acidosis and/or acute renal failure and/or metformin induced although his lactic acid level was within normal limits. The mild respiratory acidosis was likely from atelectasis, but has resolved, status post BiPAP short-term.  Acute renal failure/acute kidney injury.  His last creatinine was 1.02 during the previous hospitalization. This is likely prerenal from hypovolemia. His creatinine is better than baseline, status post IV fluid hydration. We'll stop the IV fluids.  Hypertension/CAD/history of CABG. We'll continue aspirin and beta blocker. Troponin I negative 3.  Diabetes mellitus with peripheral vascular disease and peripheral neuropathy. Metformin is being withheld. Continued to treat his diabetes with sliding scale NovoLog. We'll continue gabapentin. His hemoglobin A1c is 5.4.  Anemia. Likely chronic disease and in part dilutional from IV fluids. Multivitamin with iron added. IV fluids discontinued.  Left shoulder pain, secondary to fall. No fracture seen on x-ray. Continue analgesics as needed and topical Voltaren.  Constipation, per history. He had a large bowel movement, Status post soapsuds enema. Continue Senokot S   Time spent: 35 minutes    [facility] Pager [phone]. If 7PM-7AM, please contact night-coverage at [email or web address], password [facility] 09/12/2014,  12:52 PM  LOS: 3 days

## 2014-09-12 NOTE — Progress Notes (Signed)
PT Cancellation Note  Patient Details Name: NEEV MCMAINS MRN: [id number] DOB: 14-Jun-1941   Cancelled Treatment:    Reason Eval/Treat Not Completed: Other (comment)  Reviewed chart and noted pt agitated, requiring assist of 3 to restrain to re-insert IV line.  Pt not appropriate for therapy at this time, and will be discharged from acute PT services.  Will need new PT order for assessment if required.    Lonna Cobb, DPT [phone]

## 2014-09-12 NOTE — Clinical Social Work Note (Signed)
CSW spoke patient's wife, and made bed offer from [facility].  Mrs. Pabst accepted the bed offer from [facility].   CSW spoke with Jackelyn Poling at [facility] and advised that patient's wife had accepted the bed offer.    [name], [place]

## 2014-09-13 DIAGNOSIS — M25512 Pain in left shoulder: Secondary | ICD-10-CM

## 2014-09-13 LAB — GLUCOSE, CAPILLARY
GLUCOSE-CAPILLARY: 97 mg/dL (ref 70–99)
GLUCOSE-CAPILLARY: 130 mg/dL — AB (ref 70–99)
Glucose-Capillary: 219 mg/dL — ABNORMAL HIGH (ref 70–99)
Glucose-Capillary: 220 mg/dL — ABNORMAL HIGH (ref 70–99)

## 2014-09-13 LAB — BASIC METABOLIC PANEL
Anion gap: 9 (ref 5–15)
BUN: 9 mg/dL (ref 6–23)
CHLORIDE: 105 meq/L (ref 96–112)
CO2: 23 mmol/L (ref 19–32)
Calcium: 8.3 mg/dL — ABNORMAL LOW (ref 8.4–10.5)
Creatinine, Ser: 1.1 mg/dL (ref 0.50–1.35)
GFR calc Af Amer: 76 mL/min — ABNORMAL LOW (ref >90)
GFR, EST NON AFRICAN AMERICAN: 66 mL/min — AB (ref >90)
Glucose, Bld: 97 mg/dL (ref 70–99)
Potassium: 3.6 mmol/L (ref 3.5–5.1)
Sodium: 137 mmol/L (ref 135–145)

## 2014-09-13 LAB — CBC
HCT: 34.1 % — ABNORMAL LOW (ref 39.0–52.0)
Hemoglobin: 11.2 g/dL — ABNORMAL LOW (ref 13.0–17.0)
MCH: 32 pg (ref 26.0–34.0)
MCHC: 32.8 g/dL (ref 30.0–36.0)
MCV: 97.4 fL (ref 78.0–100.0)
Platelets: 365 10*3/uL (ref 150–400)
RBC: 3.5 MIL/uL — AB (ref 4.22–5.81)
RDW: 14.1 % (ref 11.5–15.5)
WBC: 6.3 10*3/uL (ref 4.0–10.5)

## 2014-09-13 MED ORDER — LISINOPRIL 5 MG PO TABS
5.0000 mg | ORAL_TABLET | Freq: Every day | ORAL | Status: DC
  Administered 2014-09-13 – 2014-09-14 (×2): 5 mg via ORAL
  Filled 2014-09-13 (×2): qty 1

## 2014-09-13 NOTE — Progress Notes (Signed)
While giving afternoon medications, patient began coughing with water.  Patient was sitting at a 90 degree angle in bed.  Patient was able to take medications with applesauce without coughing.  MD made aware and diet order was adjusted.  Will continue to monitor patient.

## 2014-09-13 NOTE — Progress Notes (Signed)
[facility] PROGRESS NOTE  ARCHER MOIST VZD:[id number] DOB: 1941-01-15 DOA: 09/09/2014 PCP: Leonides Grills, MD    Code Status: Full code Family Communication: Discussed with wife Disposition Plan: Discharge when clinically appropriate; probably to short-term skilled nursing facility.   Consultants:  Orthopedics pending  Procedures:  None  Antibiotics:  Cipro 09/11/14>  HPI/Subjective: Patient continues to have some sundowning, but with less combativeness and agitation last night. His wife says that he is still intermittently confused, particularly in the evening times. She also reports that he coughs frequently after he eats.He complains of worsening left shoulder pain.   Objective: Filed Vitals:   09/13/14 0857  BP: 186/94  Pulse: 81  Temp: 97.9 F (36.6 C)  Resp: 20    Intake/Output Summary (Last 24 hours) at 09/13/14 1115 Last data filed at 09/13/14 0859  Gross per 24 hour  Intake    120 ml  Output    300 ml  Net   -180 ml   Filed Weights   09/11/14 0500 09/12/14 0500 09/13/14 0547  Weight: 88.4 kg (194 lb 14.2 oz) 89.7 kg (197 lb 12 oz) 87.045 kg (191 lb 14.4 oz)    Exam:   General:  Alert 74 year old man laying in bed, in no acute distress.  Oropharynx with multiple missing teeth and poor dentition; mucous membranes are mildly dry  Cardiovascular: Distant S1, S2, with soft systolic murmur.  Respiratory: Few scattered rhonchi, partially cleared with coughing. Breathing nonlabored.  Abdomen: Hypoactive bowel sounds, soft, nontender, nondistended.  Musculoskeletal/extremities: Moderate tenderness of the left shoulder; warmth and some edema; patient resists moving it because of pain; left BKA stump intact.  Neurologic/psychiatric:  He is alert and oriented to himself, wife and place. He does not recall being confused or agitated overnight. His speech is clear and his affect is appropriate. Cranial nerves II through XII are grossly intact.    Data Reviewed: Basic Metabolic Panel:  Recent Labs Lab 09/09/14 2006 09/10/14 1038 09/11/14 0401 09/12/14 0534 09/13/14 0539  NA 134* 136 138 136 137  K 5.0 4.8 4.7 4.1 3.6  CL 104 107 112 108 105  CO2 21 24 21 21 23   GLUCOSE 93 93 95 98 97  BUN 34* 28* 24* 13 9  CREATININE 2.91* 1.74* 1.27 1.03 1.10  CALCIUM 8.4 8.2* 8.1* 8.1* 8.3*   Liver Function Tests:  Recent Labs Lab 09/09/14 2006  AST 16  ALT 11  ALKPHOS 60  BILITOT 0.6  PROT 6.7  ALBUMIN 2.8*   No results for input(s): LIPASE, AMYLASE in the last 168 hours.  Recent Labs Lab 09/09/14 2006  AMMONIA 19   CBC:  Recent Labs Lab 09/09/14 2006 09/10/14 1038 09/11/14 0401 09/12/14 0534 09/13/14 0539  WBC 6.4 6.6 5.3 5.3 6.3  NEUTROABS 5.1  --   --   --   --   HGB 11.2* 11.0* 9.9* 10.2* 11.2*  HCT 35.5* 35.1* 30.7* 31.1* 34.1*  MCV 103.8* 103.8* 101.3* 98.4 97.4  PLT 330 322 292 348 365   Cardiac Enzymes:  Recent Labs Lab 09/09/14 2006 09/09/14 2217 09/10/14 0447 09/10/14 1038  TROPONINI 0.03 0.03 0.03 0.03   BNP (last 3 results) No results for input(s): PROBNP in the last 8760 hours. CBG:  Recent Labs Lab 09/12/14 0739 09/12/14 1133 09/12/14 1700 09/12/14 2116 09/13/14 0728  GLUCAP 97 150* 110* 99 97    Recent Results (from the past 240 hour(s))  MRSA PCR Screening     Status: None  Collection Time: 09/09/14 10:15 PM  Result Value Ref Range Status   MRSA by PCR NEGATIVE NEGATIVE Final    Comment:        The GeneXpert MRSA Assay (FDA approved for NASAL specimens only), is one component of a comprehensive MRSA colonization surveillance program. It is not intended to diagnose MRSA infection nor to guide or monitor treatment for MRSA infections.      Studies: Dg Chest Port 1 View  09/12/2014   CLINICAL DATA:  Chest congestion, dementia  EXAM: PORTABLE CHEST - 1 VIEW  COMPARISON:  09/09/2013  FINDINGS: Cardiomediastinal silhouette is stable. Central mild vascular  congestion without pulmonary edema. No segmental infiltrate. Status post median sternotomy again noted.  IMPRESSION: No acute infiltrate or pleural effusion. Central mild vascular congestion without pulmonary edema. Again noted status post median sternotomy.   Electronically Signed   By: Lahoma Crocker M.D.   On: 09/12/2014 12:27    Scheduled Meds: . antiseptic oral rinse  7 mL Mouth Rinse BID  . aspirin EC  81 mg Oral Daily  . ciprofloxacin  400 mg Intravenous Q12H  . citalopram  20 mg Oral Daily  . diclofenac sodium  2 g Topical TID  . famotidine  20 mg Oral Daily  . feeding supplement (ENSURE COMPLETE)  237 mL Oral BID BM  . gabapentin  200 mg Oral TID  . heparin subcutaneous  5,000 Units Subcutaneous 3 times per day  . insulin aspart  0-5 Units Subcutaneous QHS  . insulin aspart  0-9 Units Subcutaneous TID WC  . metoprolol  50 mg Oral BID  . multivitamins with iron  1 tablet Oral Daily  . OLANZapine  2.5 mg Oral QHS  . senna-docusate  1 tablet Oral QHS  . sodium chloride  3 mL Intravenous Q12H  . tamsulosin  0.4 mg Oral BID  . thiamine  100 mg Oral Daily  . traZODone  50 mg Oral QHS  . zolpidem  10 mg Oral QHS   Continuous Infusions:   Assessment and plan:  Principal Problem:   Acute encephalopathy Active Problems:   Mixed acid base balance disorder   Acute kidney injury   Acute respiratory failure with hypercapnia   Alcoholic dementia   FTT (failure to thrive) in adult   Acute UTI   Hypertension   Recurrent falls   Diabetes mellitus with peripheral vascular disease   Peripheral vascular disease   Left shoulder pain   CAD in native artery   Protein-calorie malnutrition   1. Acute metabolic encephalopathy with with behavioral disturbances, associated with UTI, failure to thrive, senile decline, recurrent falls, anorexia, and likely alcoholic dementia. His symptomatology is consistent with sundowning. Added trazodone and Zyprexa scheduled at bedtime; will continue with  possible titration and the next day or 2. When necessary Haldol and diazepam were starte started and will. This is the patient's second admission/hospitalization in 2 weeks for essentially the same. The patient appears to be failing to thrive at home and has had recurrent falls out of the bed as he is an amputee. Per his wife, he has not been eating or drinking and has just been sitting around and not engaging in life. I asked the patient if he had difficulty swallowing or pain with swallowing or abdominal pain and he stated no, but he does have some difficulty chewing from a lack of teeth. CT scan of his head revealed atrophy but no active disease. Ammonia level was within normal limits. Vitamin B12 level  and TSH were within normal limits during the previous hospitalization. We'll continue thiamine and supportive treatment. The patient would benefit from short-term skilled nursing facility placement as his wife appears to be somewhat overwhelmed. Physical therapy consult noted, but his wife is requesting skilled nursing facility placement for which I agree.  Dementia with behavioral disturbances/sundowning. The patient is appropriate and articulate during the day, but becomes confused at bedtime, consistent with sundowning. We'll continue when necessary Haldol and Valium; will continue scheduled Zyprexa and trazodone at bedtime. Consider increasing the dosing over the next day or 2.   Pulmonary congestion without frank edema per chest x-ray. Query query dysphagia/aspiration Patient continues to have intermittent  rhonchi on exam, cleared partially with coughing. This appears to be coinciding with mealtimes and highly suggestive of some dysphagia. His diet was downgraded to dysphagia to given his lack of teeth. We'll asked the nursing staff to assist with feeding and to instill aspiration precautions. We'll consult speech therapy for an official evaluation. IV fluids have been saline  locked. Status post 1 dose of IV Lasix empirically given that he had several days of IV fluid hydration.  Urinary tract infection, per urinalysis. Continue Cipro. Urine culture was ordered, but apparently a specimen was not sent.  Left shoulder pain; status post fall at home. His pain has worsened and there is some edema and warmth on exam. X-ray on admission revealed no fracture. He may have rotator cuff tear or strain or sprain. We'll continue analgesics and topical Voltaren. We'll order MRI of his left shoulder and consult orthopedics to see if aspiration and/or depot steroid injection would help.  Hypertension/CAD/history of CABG. His blood pressure has trended up. We'll start an ACE inhibitor for further treatment. We'll continue aspirin and beta blocker. Troponin I negative 3.  Severe protein calorie malnutrition. Continue dysphagia 2 diet for better tolerance given multiple missing teeth. Continue Ensure  Mixed acidosis: Mild respiratory acidosis with primary metabolic acidosis. Clinically this is resolving. His CO2 is within normal limits. We'll hold off on further assessments with ABGs. This was likely starvation acidosis and/or acute renal failure and/or metformin induced although his lactic acid level was within normal limits. The mild respiratory acidosis was likely from atelectasis, but has resolved, status post BiPAP short-term.  Acute renal failure/acute kidney injury.  His last creatinine was 1.02 during the previous hospitalization. It was 2.91 on admission. This is likely prerenal from hypovolemia. His creatinine is better than baseline, status post IV fluid hydration.  Diabetes mellitus with peripheral vascular disease and peripheral neuropathy. Metformin is being withheld. Continued to treat his diabetes with sliding scale NovoLog. We'll continue gabapentin. His hemoglobin A1c was 5.4.  Anemia. Likely chronic disease and in part dilutional from IV  fluids. Multivitamin with iron added. IV fluids discontinued.  Constipation, per history. He had a large bowel movement, Status post soapsuds enema. Continue Senokot S   Time spent: 30 minutes    [facility] Pager [phone]. If 7PM-7AM, please contact night-coverage at [email or web address], password [facility] 09/13/2014, 11:15 AM  LOS: 4 days

## 2014-09-13 NOTE — Progress Notes (Deleted)
Patient received discharge instructions, scripts, and note for her primary care doctor.  Patient had no further questions/concerns about medications or discharge instructions.  Patient verbalized understanding.  Patient in stable condition at discharge, IV removed and was clean, dry, and intact.  Patient requested to walk out of hospital and was escorted to main entrance by nurse tech.

## 2014-09-14 ENCOUNTER — Encounter (HOSPITAL_COMMUNITY): Payer: Self-pay | Admitting: Internal Medicine

## 2014-09-14 DIAGNOSIS — M75112 Incomplete rotator cuff tear or rupture of left shoulder, not specified as traumatic: Secondary | ICD-10-CM

## 2014-09-14 LAB — GLUCOSE, CAPILLARY
GLUCOSE-CAPILLARY: 167 mg/dL — AB (ref 70–99)
Glucose-Capillary: 132 mg/dL — ABNORMAL HIGH (ref 70–99)
Glucose-Capillary: 207 mg/dL — ABNORMAL HIGH (ref 70–99)
Glucose-Capillary: 167 mg/dL — ABNORMAL HIGH (ref 70–99)

## 2014-09-14 MED ORDER — DIAZEPAM 2 MG PO TABS: 2.0000 mg | ORAL_TABLET | Freq: Four times a day (QID) | ORAL | Status: DC | PRN

## 2014-09-14 MED ORDER — LISINOPRIL 5 MG PO TABS: 2.5000 mg | ORAL_TABLET | Freq: Every day | ORAL | Status: DC

## 2014-09-14 NOTE — Progress Notes (Signed)
[facility] PROGRESS NOTE  KENDRA WOOLFORD OZY:[id number] DOB: Jan 23, 1941 DOA: 09/09/2014 PCP: Leonides Grills, MD    Code Status: Full code Family Communication: Discussed with wife on 09/13/14. Disposition Plan: Discharge when clinically appropriate; probably to short-term skilled nursing facility.   Consultants:  Orthopedics, Dr. Aline Brochure  Procedures:  None  Antibiotics:  Cipro 09/11/14>  HPI/Subjective: Patient is sleeping, he was not awakened. Nursing reports that the patient did not choke or cough with dysphagia 1 diet with thickened liquids. He was talkative and alert and not confused per nursing.   Objective: Filed Vitals:   09/14/14 1001  BP: 124/71  Pulse: 81  Temp: 97.5 F (36.4 C)  Resp: 20   oxygen saturation 96% on room air  Intake/Output Summary (Last 24 hours) at 09/14/14 1551 Last data filed at 09/14/14 1238  Gross per 24 hour  Intake    303 ml  Output    400 ml  Net    -97 ml   Filed Weights   09/12/14 0500 09/13/14 0547 09/14/14 0544  Weight: 89.7 kg (197 lb 12 oz) 87.045 kg (191 lb 14.4 oz) 85.095 kg (187 lb 9.6 oz)    Exam:   General:  Alert 74 year old man laying in bed, in no acute distress.  Cardiovascular: Distant S1, S2, with soft systolic murmur.  Respiratory: Coarse breath sounds with no significant rhonchi while sleeping. Breathing nonlabored.  Abdomen: Hypoactive bowel sounds, soft, nontender, nondistended.  Musculoskeletal/extremities: No pedal edema. Left shoulder not examined while the patient slept.  Data Reviewed: Basic Metabolic Panel:  Recent Labs Lab 09/09/14 2006 09/10/14 1038 09/11/14 0401 09/12/14 0534 09/13/14 0539  NA 134* 136 138 136 137  K 5.0 4.8 4.7 4.1 3.6  CL 104 107 112 108 105  CO2 21 24 21 21 23   GLUCOSE 93 93 95 98 97  BUN 34* 28* 24* 13 9  CREATININE 2.91* 1.74* 1.27 1.03 1.10  CALCIUM 8.4 8.2* 8.1* 8.1* 8.3*   Liver Function Tests:  Recent Labs Lab 09/09/14 2006  AST 16   ALT 11  ALKPHOS 60  BILITOT 0.6  PROT 6.7  ALBUMIN 2.8*   No results for input(s): LIPASE, AMYLASE in the last 168 hours.  Recent Labs Lab 09/09/14 2006  AMMONIA 19   CBC:  Recent Labs Lab 09/09/14 2006 09/10/14 1038 09/11/14 0401 09/12/14 0534 09/13/14 0539  WBC 6.4 6.6 5.3 5.3 6.3  NEUTROABS 5.1  --   --   --   --   HGB 11.2* 11.0* 9.9* 10.2* 11.2*  HCT 35.5* 35.1* 30.7* 31.1* 34.1*  MCV 103.8* 103.8* 101.3* 98.4 97.4  PLT 330 322 292 348 365   Cardiac Enzymes:  Recent Labs Lab 09/09/14 2006 09/09/14 2217 09/10/14 0447 09/10/14 1038  TROPONINI 0.03 0.03 0.03 0.03   BNP (last 3 results) No results for input(s): PROBNP in the last 8760 hours. CBG:  Recent Labs Lab 09/13/14 1125 09/13/14 1614 09/13/14 2042 09/14/14 0739 09/14/14 1123  GLUCAP 219* 220* 130* 132* 207*    Recent Results (from the past 240 hour(s))  MRSA PCR Screening     Status: None   Collection Time: 09/09/14 10:15 PM  Result Value Ref Range Status   MRSA by PCR NEGATIVE NEGATIVE Final    Comment:        The GeneXpert MRSA Assay (FDA approved for NASAL specimens only), is one component of a comprehensive MRSA colonization surveillance program. It is not intended to diagnose MRSA infection nor to guide or  monitor treatment for MRSA infections.      Studies: No results found.  Scheduled Meds: . antiseptic oral rinse  7 mL Mouth Rinse BID  . aspirin EC  81 mg Oral Daily  . ciprofloxacin  400 mg Intravenous Q12H  . citalopram  20 mg Oral Daily  . diclofenac sodium  2 g Topical TID  . famotidine  20 mg Oral Daily  . feeding supplement (ENSURE COMPLETE)  237 mL Oral BID BM  . gabapentin  200 mg Oral TID  . heparin subcutaneous  5,000 Units Subcutaneous 3 times per day  . insulin aspart  0-5 Units Subcutaneous QHS  . insulin aspart  0-9 Units Subcutaneous TID WC  . lisinopril  5 mg Oral Daily  . metoprolol  50 mg Oral BID  . multivitamins with iron  1 tablet Oral Daily   . OLANZapine  2.5 mg Oral QHS  . senna-docusate  1 tablet Oral QHS  . sodium chloride  3 mL Intravenous Q12H  . tamsulosin  0.4 mg Oral BID  . thiamine  100 mg Oral Daily  . traZODone  50 mg Oral QHS  . zolpidem  10 mg Oral QHS   Continuous Infusions:   Assessment and plan:  Principal Problem:   Acute encephalopathy Active Problems:   Mixed acid base balance disorder   Acute kidney injury   Acute respiratory failure with hypercapnia   Alcoholic dementia   FTT (failure to thrive) in adult   Acute UTI   Hypertension   Recurrent falls   Diabetes mellitus with peripheral vascular disease   RUPTURE ROTATOR CUFF   Peripheral vascular disease   Left shoulder pain   CAD in native artery   Protein-calorie malnutrition   1. Acute metabolic encephalopathy with with behavioral disturbances, associated with UTI, failure to thrive, senile decline, recurrent falls, anorexia, and likely alcoholic dementia. His symptomatology is consistent with sundowning. The trazodone and Zyprexa appeared to be helping as he has been less and less agitated and more lucid. Continue when necessary Haldol and diazepam, but will decrease the dose of diazepam. This is the patient's second admission/hospitalization in 2 weeks for essentially the same. The patient appears to be failing to thrive at home and has had recurrent falls out of the bed as he is an amputee. Per his wife, he has not been eating or drinking and has just been sitting around and not engaging in life. I asked the patient if he had difficulty swallowing or pain with swallowing or abdominal pain and he stated no, but he does have some difficulty chewing from a lack of teeth. CT scan of his head revealed atrophy but no active disease. Ammonia level was within normal limits. Vitamin B12 level and TSH were within normal limits during the previous hospitalization. We'll continue thiamine and supportive treatment. The patient would benefit from  short-term skilled nursing facility placement as his wife appears to be somewhat overwhelmed. Follow-up physical therapy evaluation noted and appreciated. We'll move forward to skilled nursing facility placement in the next 24-48 hours.  Dementia with behavioral disturbances/sundowning. The patient is appropriate and articulate during the day, but becomes confused at bedtime, consistent with sundowning. He has been less combative and confused with the start of trazodone and Zyprexa. We'll continue these medications as well as when necessary Haldol and Valium.  Pulmonary congestion without frank edema per chest x-ray. Query query dysphagia/aspiration Patient has intermittent rhonchi/crackles on exam, cleared partially with coughing. This appears to be coinciding  with mealtimes and highly suggestive of some dysphagia. His diet was downgraded to dysphagia 1 given his lack of teeth. His liquids were changed to honey thickened. The nursing staff is assisting the patient with feeding and aspiration precautions.  We'll consult speech therapy for an official evaluation. IV fluids have been saline locked. Status post 1 dose of IV Lasix empirically given that he had several days of IV fluid hydration.  Urinary tract infection, per urinalysis. Continue Cipro. Urine culture was ordered, but apparently a specimen was not sent.  Left shoulder pain; status post fall at home. Orthopedic surgeon, Dr. Aline Brochure was consulted. He noted that the patient has a chronically torn left rotator cuff/rupture/tendinopathy from 2011. He recommends a shoulder sling. We'll cancel the MRI previously ordered. X-ray on admission revealed no fracture. He may have rotator cuff tear or strain or sprain. We'll continue analgesics and topical Voltaren.  Hypertension/CAD/history of CABG. His blood pressure had trended up. The lisinopril added. Continue metoprolol. Blood pressure has improved. Troponin I negative 3.  Severe  protein calorie malnutrition. Continue dysphagia 1 diet and honey thick liquids for better tolerance given multiple missing teeth. Continue Ensure  Mixed acidosis: Mild respiratory acidosis with primary metabolic acidosis. Clinically this is resolving. His CO2 is within normal limits. We'll hold off on further assessments with ABGs. This was likely starvation acidosis and/or acute renal failure and/or metformin induced although his lactic acid level was within normal limits. The mild respiratory acidosis was likely from atelectasis, but has resolved, status post BiPAP short-term.  Acute renal failure/acute kidney injury.  His last creatinine was 1.02 during the previous hospitalization. It was 2.91 on admission. This is likely prerenal from hypovolemia. His creatinine is better than baseline, status post IV fluid hydration.  Diabetes mellitus with peripheral vascular disease and peripheral neuropathy. Metformin is being withheld. Continued to treat his diabetes with sliding scale NovoLog. We'll continue gabapentin. His hemoglobin A1c was 5.4.  Anemia. Likely chronic disease and in part dilutional from IV fluids. Multivitamin with iron added. IV fluids discontinued.  Constipation, per history.  Continue Senokot S   Time spent: 25 minutes    [facility] [facility] [facility] Pager [phone]. If 7PM-7AM, please contact night-coverage at [email or web address], password [facility] 09/14/2014, 3:51 PM  LOS: 5 days

## 2014-09-14 NOTE — Evaluation (Signed)
Physical Therapy Evaluation Patient Details Name: [name] MRN: [id number] DOB: 07-01-1941 Today's Date: 09/14/2014   History of Present Illness  Pt has been admitted from home due to a UTI.  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  He has had acute encephalopathy due to UTI.  Pt has been evaluated by PT 09-10-14 and was found to be unsuited for rehab due to the inability to follow any directions as well as significant agitation.  He has now been much more responsive and PT is asked to reassess for placement issues.  Clinical Impression  Pt was seen for a reassessment this morning.  He was alert and fully oriented.  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  He has a rotator cuff tear to the left shoulder which will need to be maintained in a sling per MD order.  Pt has 3/5 strength in the left stump and right LE.  He does have a mild knee flexion contracture right which can be reduced by about 10 degrees with stretching.  He currently will require a lift for transfer bed to chair.  He should now be appropriate for transfer to SNF in order to develop improved strength and transfer ability.    Follow Up Recommendations SNF    Equipment Recommendations  None recommended by PT    Recommendations for Other Services   OT    Precautions / Restrictions Precautions Precautions: Fall      Mobility  Bed Mobility Overal bed mobility: +2 for physical assistance             General bed mobility comments: pt currently needs 2 person assist due to generalized weakness   Transfers Overall transfer level:  (not tested)                  Ambulation/Gait                Stairs            Wheelchair Mobility    Modified Rankin (Stroke Patients Only)       Balance Overall balance assessment: History  of Falls                                           Pertinent Vitals/Pain Pain Assessment: No/denies pain    Home Living Family/patient expects to be discharged to:: Skilled nursing facility                      Prior Function Level of Independence: Needs assistance   Gait / Transfers Assistance Needed: pt has not been amubaltory although he has a left LE prosthesis...pt states that he has not been able to ambulate due to the inability to straighten his right knee...he has been lifted in and out of his w/c by his wife.  ADL's / Homemaking Assistance Needed: full assist with ADLs but is able to feed himself        Hand Dominance   Dominant Hand: Right    Extremity/Trunk Assessment   Upper Extremity Assessment: LUE deficits/detail;Generalized weakness       LUE Deficits / Details: pt has a significant rotator cuff tear per Dr. Aline Brochure and is to wear a sling on the  LUE   Lower Extremity Assessment: Generalized weakness;RLE deficits/detail (pt does have 3/5 strength in LEs) RLE Deficits / Details: pt has a mild knee flexion contracture of 25 degrees but this can be stretched to achieve -15 degrees of extension       Communication   Communication: No difficulties  Cognition Arousal/Alertness: Awake/alert Behavior During Therapy: WFL for tasks assessed/performed Overall Cognitive Status: Within Functional Limits for tasks assessed     Current Attention Level: Focused   Following Commands: Follows one step commands consistently       General Comments: pt is now much more lucid and is able to describe some of his past...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    General Comments      Exercises        Assessment/Plan    PT Assessment All further PT needs can be met in the next venue of care  PT Diagnosis Generalized weakness   PT Problem List Decreased strength;Decreased activity tolerance;Decreased mobility  PT  Treatment Interventions     PT Goals (Current goals can be found in the Care Plan section) Acute Rehab PT Goals PT Goal Formulation: All assessment and education complete, DC therapy    Frequency     Barriers to discharge Decreased caregiver support wife is exhausted from trying to care for pt    Co-evaluation               End of Session   Activity Tolerance: Patient tolerated treatment well Patient left: in bed;with call bell/phone within reach;with bed alarm set           Time: 1130-1159 PT Time Calculation (min) (ACUTE ONLY): 29 min   Charges:   PT Evaluation $PT Re-evaluation: 1 Procedure     PT G CodesDemetrios Isaacs L 09/14/2014, 12:11 PM

## 2014-09-14 NOTE — Progress Notes (Signed)
Shoulder pain  No fracture   IMPRESSION:    1.  Full-thickness partial-width tearing of much of the upper portion of the subscapularis tendon.  There is suspected atrophy of the subscapularis muscle. 2.  Moderate to prominent supraspinatus tendinopathy.  Mild infraspinatus tendinopathy. 3.  Marked expansion of the intra-articular portion of the long head of the biceps, with increased internal signal compatible with edema or hemorrhage.  I expect that the tendon is at least partially torn, and the biceps tendon is displaced medially. 4.  Subcortical and surrounding soft tissue edema associated with the acromioclavicular joint.  Left shoulder pain   Recommend sling   Follow as outpatient

## 2014-09-14 NOTE — Consult Note (Signed)
Reason for Consult: Left shoulder pain after fall Referring Physician: Dr.   [name] is an 74 y.o. male.  HPI: 74 year old male admitted for various medical problems complained of left shoulder pain. He has a history of previous MRI in 2011 showed rotator cuff tear. He has medical problems which probably prevented him from having surgery. He fell prior to this admission complains of increased pain in his left shoulder. X-ray has been done and it was normal.  He complains of painful range of motion tenderness proximal humeral area and shoulder joint. Weakness is noted as well.   Past Medical History  Diagnosis Date  . Asthma   . Hypertension   . Ureter cancer 12/2001    "shut down my kidney & resulted in nephrectomy" (07/09/2013)  . Coronary artery disease   . High cholesterol   . Heart murmur     "had rheumatic fever as a kid" (07/09/2013)  . Rheumatic fever   . Myocardial infarction 03/2002; ~ 2011  . Anginal pain   . Pneumonia     "once" (07/09/2013)  . Shortness of breath     "can happen at anytime" (07/09/2013)  . COPD (chronic obstructive pulmonary disease)     pt denies this hx on 07/09/2013  . Type II diabetes mellitus   . H/O hiatal hernia 1997  . Arthritis     "qwhere" (07/09/2013)  . Gout   . Renal disorder     "only have 1 kidney; due to go back soon to check the other one" (07/09/2013)    Past Surgical History  Procedure Laterality Date  . Nephrectomy Left 12/19/2001  . Femoral artery stent Right 07/09/2013  . Cholecystectomy  1980's  . Coronary artery bypass graft  ~ 2010    "CABG X5" (07/09/2013)  . Glaucoma surgery Bilateral ?[id number]  . Eye surgery    . Carpal tunnel release Bilateral ~2004; ~ 2011  . Trigger finger release Left ~ 1990's  . Cardiac catheterization  ~ 2010    "before the bypass" (07/09/2013)  . Below knee leg amputation Left 08/2009  . Above knee leg amputation Left 09/2009  . Lower extremity angiogram Right Nov. 4, 2014  . Lower  extremity angiogram Right 07/09/2013    Procedure: LOWER EXTREMITY ANGIOGRAM;  Surgeon: Serafina Mitchell, MD;  Location: [facility] C[facility] LAB;  Service: Cardiovascular;  Laterality: Right;    Family History  Problem Relation Age of Onset  . Cancer Mother   . Heart attack Father     Social History:  reports that he has been smoking Cigarettes.  He has a 54 pack-year smoking history. He has never used smokeless tobacco. He reports that he drinks alcohol. He reports that he does not use illicit drugs.  Allergies:  Allergies  Allergen Reactions  . Iohexol      Code: HIVES, Desc: PT REPORTS THROAT SWELLING, HIVES AND ITCHING, 13 HR PRE-MED GIVEN AND SUCCESSFUL-ARS 07/15/07   . Penicillins Itching and Swelling    Throat swelling  . Erythromycin Swelling and Rash    Throat swelling    Medications: I have reviewed the patient's current medications.  Results for orders placed or performed during the hospital encounter of 09/09/14 (from the past 48 hour(s))  Glucose, capillary     Status: Abnormal   Collection Time: 09/12/14 11:33 AM  Result Value Ref Range   Glucose-Capillary 150 (H) 70 - 99 mg/dL   Comment 1 Notify RN   Glucose, capillary  Status: Abnormal   Collection Time: 09/12/14  5:00 PM  Result Value Ref Range   Glucose-Capillary 110 (H) 70 - 99 mg/dL   Comment 1 Notify RN   Glucose, capillary     Status: None   Collection Time: 09/12/14  9:16 PM  Result Value Ref Range   Glucose-Capillary 99 70 - 99 mg/dL  Basic metabolic panel     Status: Abnormal   Collection Time: 09/13/14  5:39 AM  Result Value Ref Range   Sodium 137 135 - 145 mmol/L    Comment: Please note change in reference range.   Potassium 3.6 3.5 - 5.1 mmol/L    Comment: Please note change in reference range.   Chloride 105 96 - 112 mEq/L   CO2 23 19 - 32 mmol/L   Glucose, Bld 97 70 - 99 mg/dL   BUN 9 6 - 23 mg/dL   Creatinine, Ser 1.10 0.50 - 1.35 mg/dL   Calcium 8.3 (L) 8.4 - 10.5 mg/dL   GFR calc non Af Amer  66 (L) >90 mL/min   GFR calc Af Amer 76 (L) >90 mL/min    Comment: (NOTE) The eGFR has been calculated using the CKD EPI equation. This calculation has not been validated in all clinical situations. eGFR's persistently <90 mL/min signify possible Chronic Kidney Disease.    Anion gap 9 5 - 15  CBC     Status: Abnormal   Collection Time: 09/13/14  5:39 AM  Result Value Ref Range   WBC 6.3 4.0 - 10.5 K/uL   RBC 3.50 (L) 4.22 - 5.81 MIL/uL   Hemoglobin 11.2 (L) 13.0 - 17.0 g/dL   HCT 34.1 (L) 39.0 - 52.0 %   MCV 97.4 78.0 - 100.0 fL   MCH 32.0 26.0 - 34.0 pg   MCHC 32.8 30.0 - 36.0 g/dL   RDW 14.1 11.5 - 15.5 %   Platelets 365 150 - 400 K/uL  Glucose, capillary     Status: None   Collection Time: 09/13/14  7:28 AM  Result Value Ref Range   Glucose-Capillary 97 70 - 99 mg/dL   Comment 1 Notify RN    Comment 2 Documented in Chart   Glucose, capillary     Status: Abnormal   Collection Time: 09/13/14 11:25 AM  Result Value Ref Range   Glucose-Capillary 219 (H) 70 - 99 mg/dL   Comment 1 Notify RN   Glucose, capillary     Status: Abnormal   Collection Time: 09/13/14  4:14 PM  Result Value Ref Range   Glucose-Capillary 220 (H) 70 - 99 mg/dL   Comment 1 Notify RN   Glucose, capillary     Status: Abnormal   Collection Time: 09/13/14  8:42 PM  Result Value Ref Range   Glucose-Capillary 130 (H) 70 - 99 mg/dL   Comment 1 Notify RN   Glucose, capillary     Status: Abnormal   Collection Time: 09/14/14  7:39 AM  Result Value Ref Range   Glucose-Capillary 132 (H) 70 - 99 mg/dL   Comment 1 Documented in Chart    Comment 2 Notify RN     Dg Chest Port 1 View  09/12/2014   CLINICAL DATA:  Chest congestion, dementia  EXAM: PORTABLE CHEST - 1 VIEW  COMPARISON:  09/09/2013  FINDINGS: Cardiomediastinal silhouette is stable. Central mild vascular congestion without pulmonary edema. No segmental infiltrate. Status post median sternotomy again noted.  IMPRESSION: No acute infiltrate or pleural  effusion. Central mild vascular congestion  without pulmonary edema. Again noted status post median sternotomy.   Electronically Signed   By: Lahoma Crocker M.D.   On: 09/12/2014 12:27    ROS Blood pressure 124/71, pulse 81, temperature 97.5 F (36.4 C), temperature source Oral, resp. rate 20, height 6' (1.829 m), weight 187 lb 9.6 oz (85.095 kg), SpO2 96 %. Physical Exam   Normal development grooming and hygiene  Tenderness and swelling over the left proximal shoulder without ecchymosis. He has painful range of motion in all planes decreased range of motion in all planes as well. Weaknesses noted in the rotator cuff and abduction and forward elevation. His passive range of motion 40 of external rotation. 70 of forward elevation 70 of abduction. All painful.  Neurovascular status of left upper extremity normal. Hand wrist and elbow normal. Lymph nodes axilla supravesicular region normal. Neck nontender.  He was alert awake and oriented 3 mood and affect were pleasant.    Assessment/Plan: Left rotator cuff tear with acute exacerbation of pain no fracture  Recommend immobilization followed by physical therapy. Patient does not appear to be surgical candidate.  Arther Abbott 09/14/2014, 11:16 AM

## 2014-09-15 DIAGNOSIS — W07XXXD Fall from chair, subsequent encounter: Secondary | ICD-10-CM | POA: Diagnosis not present

## 2014-09-15 DIAGNOSIS — N400 Enlarged prostate without lower urinary tract symptoms: Secondary | ICD-10-CM | POA: Diagnosis not present

## 2014-09-15 DIAGNOSIS — R52 Pain, unspecified: Secondary | ICD-10-CM | POA: Diagnosis not present

## 2014-09-15 DIAGNOSIS — Z87891 Personal history of nicotine dependence: Secondary | ICD-10-CM | POA: Diagnosis not present

## 2014-09-15 DIAGNOSIS — M75102 Unspecified rotator cuff tear or rupture of left shoulder, not specified as traumatic: Secondary | ICD-10-CM | POA: Diagnosis not present

## 2014-09-15 DIAGNOSIS — E559 Vitamin D deficiency, unspecified: Secondary | ICD-10-CM | POA: Diagnosis not present

## 2014-09-15 LAB — BASIC METABOLIC PANEL
Anion gap: 8 (ref 5–15)
BUN: 14 mg/dL (ref 6–23)
CALCIUM: 8 mg/dL — AB (ref 8.4–10.5)
CO2: 24 mmol/L (ref 19–32)
CREATININE: 1.29 mg/dL (ref 0.50–1.35)
Chloride: 104 mEq/L (ref 96–112)
GFR calc non Af Amer: 54 mL/min — ABNORMAL LOW (ref >90)
GFR, EST AFRICAN AMERICAN: 63 mL/min — AB (ref >90)
Glucose, Bld: 122 mg/dL — ABNORMAL HIGH (ref 70–99)
Potassium: 3.3 mmol/L — ABNORMAL LOW (ref 3.5–5.1)
Sodium: 136 mmol/L (ref 135–145)

## 2014-09-15 LAB — CBC
HCT: 32.1 % — ABNORMAL LOW (ref 39.0–52.0)
Hemoglobin: 10.8 g/dL — ABNORMAL LOW (ref 13.0–17.0)
MCH: 32.4 pg (ref 26.0–34.0)
MCHC: 33.6 g/dL (ref 30.0–36.0)
MCV: 96.4 fL (ref 78.0–100.0)
Platelets: 335 10*3/uL (ref 150–400)
RBC: 3.33 MIL/uL — AB (ref 4.22–5.81)
RDW: 14.4 % (ref 11.5–15.5)
WBC: 7.9 10*3/uL (ref 4.0–10.5)

## 2014-09-15 LAB — GLUCOSE, CAPILLARY
Glucose-Capillary: 167 mg/dL — ABNORMAL HIGH (ref 70–99)
Glucose-Capillary: 186 mg/dL — ABNORMAL HIGH (ref 70–99)

## 2014-09-15 MED ORDER — CIPROFLOXACIN HCL 500 MG PO TABS: 500.0000 mg | ORAL_TABLET | Freq: Two times a day (BID) | ORAL | Status: DC

## 2014-09-15 MED ORDER — RAMIPRIL 5 MG PO CAPS: 5.0000 mg | ORAL_CAPSULE | Freq: Every morning | ORAL | Status: DC

## 2014-09-15 MED ORDER — DICLOFENAC SODIUM 1 % TD GEL: 2.0000 g | Freq: Three times a day (TID) | TRANSDERMAL | Status: DC

## 2014-09-15 MED ORDER — TAB-A-VITE/IRON PO TABS: 1.0000 | ORAL_TABLET | Freq: Every day | ORAL | Status: DC

## 2014-09-15 MED ORDER — TRAZODONE HCL 50 MG PO TABS: 50.0000 mg | ORAL_TABLET | Freq: Every day | ORAL | Status: DC

## 2014-09-15 MED ORDER — POTASSIUM CHLORIDE CRYS ER 10 MEQ PO TBCR: 10.0000 meq | EXTENDED_RELEASE_TABLET | Freq: Every day | ORAL | Status: DC

## 2014-09-15 MED ORDER — DIAZEPAM 5 MG PO TABS: 10.0000 mg | ORAL_TABLET | Freq: Four times a day (QID) | ORAL | Status: DC | PRN

## 2014-09-15 MED ORDER — SENNOSIDES-DOCUSATE SODIUM 8.6-50 MG PO TABS: 1.0000 | ORAL_TABLET | Freq: Every day | ORAL | Status: DC

## 2014-09-15 MED ORDER — HYDROCODONE-ACETAMINOPHEN 7.5-325 MG PO TABS: 1.0000 | ORAL_TABLET | ORAL | Status: DC | PRN

## 2014-09-15 MED ORDER — POTASSIUM CHLORIDE CRYS ER 20 MEQ PO TBCR: 20.0000 meq | EXTENDED_RELEASE_TABLET | Freq: Every day | ORAL | Status: DC

## 2014-09-15 MED ORDER — FAMOTIDINE 20 MG PO TABS
20.0000 mg | ORAL_TABLET | Freq: Every day | ORAL | Status: DC
Start: 2014-09-15 — End: 2015-01-29

## 2014-09-15 MED ORDER — POTASSIUM CHLORIDE CRYS ER 20 MEQ PO TBCR
20.0000 meq | EXTENDED_RELEASE_TABLET | Freq: Two times a day (BID) | ORAL | Status: DC
  Administered 2014-09-15: 20 meq via ORAL

## 2014-09-15 MED ORDER — OLANZAPINE 2.5 MG PO TABS: 2.5000 mg | ORAL_TABLET | Freq: Every day | ORAL | Status: DC

## 2014-09-15 MED ORDER — ENSURE COMPLETE PO LIQD: 237.0000 mL | Freq: Two times a day (BID) | ORAL | Status: DC

## 2014-09-15 MED ORDER — ALBUTEROL SULFATE HFA 108 (90 BASE) MCG/ACT IN AERS: 2.0000 | INHALATION_SPRAY | Freq: Two times a day (BID) | RESPIRATORY_TRACT | Status: DC

## 2014-09-15 NOTE — Discharge Summary (Signed)
Physician Discharge Summary  [name] FBP:[id number] DOB: 1941/08/25 DOA: 09/09/2014  PCP: Leonides Grills, MD  Admit date: 09/09/2014 Discharge date: 09/15/2014  Time spent: GREATER THAN 30 minutes  Recommendations for Outpatient Follow-up:  1. Recommend checking the patient's blood sugars once or twice daily. 2. Keep left arm in a sling for comfort.  3. Assist with feeding the patient with each meal to prevent aspiration. 4. Recommend rechecking the patient's serum potassium and renal function in the next 5-7 days.  Discharge Diagnoses:    1. Acute metabolic encephalopathy, associated with urinary tract infection. 2. 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 3. Chronic depression with anxiety. 4. Generalized adult failure to thrive and senile decline. 4. Urinary tract infection. 5. Acute kidney injury secondary to prerenal azotemia and in the setting of ACE inhibitor therapy. 6. Dysphagia. Patient will need a dysphagia 2 with nectar thickened liquid diet. 7. Protein calorie malnutrition. 8. Coronary artery disease/history of CABG. 9. Hypertension. 10. Type 2 diabetes mellitus with peripheral vascular disease. Hemoglobin A1c was 5.4. 11. Peripheral vascular disease, status post left BKA. 12. Mixed acidosis-mild respiratory acidosis with primary metabolic acidosis. 13. History recurrent falls out of the wheelchair at home. 14. Chronic left rotator cuff tear. 5. Macrocytic anemia. TSH and vitamin B12 were within normal limits.   Discharge Condition: Stable.  Diet recommendation: Dysphagia 2 with nectar thick liquids. Please assist with meals to avoid aspiration.  Filed Weights   09/13/14 0547 09/14/14 0544 09/15/14 0556  Weight: 87.045 kg (191 lb 14.4 oz) 85.095 kg (187 lb 9.6 oz) 84.81 kg (186 lb 15.6 oz)    History of present illness:  The patient is a 74 year old man with a history of coronary artery disease, hypertension, diabetes mellitus, and former  alcoholism, who presented to the emergency department on 09/09/2014 with a report of altered mental status, falling out of the wheelchair, poor oral intake, and generalized weakness. He had just been discharged from the hospital on 08/27/2014 For treatment of mechanical fall, encephalopathy, and failure to thrive. In the ED, he was afebrile, mildly tachycardic, and with a blood pressure of 140/122. His urine drug screen was positive for benzodiazepines and opiates. His venous glucose was 74. His lab data were significant for a hemoglobin of 11.2, MCV of 103.8, BUN of 34, creatinine of 2.91, and normal LFTs. His ABG revealed pH of 7.18, PCO2 of 53, and PO2 of 87. X-ray of his left shoulder reveal degenerative changes but no acute fracture. CT of the cervical spine revealed degenerative joint disease but no acute fracture. CT of his head revealed age-related cerebral atrophy, ventriculomegaly, and periventricular white matter disease, but no acute findings or skull fracture. His urinalysis revealed greater than 1.030 specific gravity, too numerous to count WBCs, and many bacteria. He was admitted for further evaluation and management.    Hospital Course:   1. Acute metabolic encephalopathy with with behavioral disturbances, associated with UTI, failure to thrive, senile decline, recurrent falls, anorexia, and likely alcoholic dementia. The patient appeared to be lucid and oriented during the day and confused and agitated at night. His symptomatology was consistent with sundowning. Halcion and Ambien were discontinued. When necessary diazepam at a lower than home dose was continued. He was continued on thiamine. As needed Haldol was ordered. Eventually, trazodone and Zyprexa were added daily at bedtime. These changes help to decrease his agitation at bedtime/decreases sundowning. This was the patient's second admission/hospitalization in 2 weeks for essentially the same. The patient appears to be failing  to  thrive at home and has had recurrent falls out of the bed as he is an amputee. Per his wife, he had not been eating or drinking and has just been sitting around and not engaging in life. I asked the patient if he had difficulty swallowing or pain with swallowing or abdominal pain and he stated no, but he did have some difficulty chewing from a lack of teeth. Also asked him if he was depressed and he stated no. CT scan of his head revealed atrophy but no active disease. Ammonia level was within normal limits. Vitamin B12 level and TSH were within normal limits during the previous hospitalization. He was treated supportively. His wife requested that the patient be evaluated for transfer to a skilled nursing facility for short-term rehabilitation as the patient is generally weak and needed to be stronger for her to take care of him. The physical therapist did evaluate him and recommended short-term skilled nursing facility placement. The plan is to discharge him today. Dementia with behavioral disturbances/sundowning. The patient was appropriate and articulate during the day, but became confused at bedtime, consistent with sundowning. As above, I believe the patient has alcoholic dementia, although he no longer drinks. Management was started and continued as above #1. Pulmonary congestion without frank edema per chest x-ray.  Dysphagia. Patient had intermittent rhonchi/crackles on exam, cleared partially with coughing. This appeared to be coinciding with mealtimes and highly suggestive of some dysphagia. He also has COPD. He was given 1 dose of IV Lasix empirically as he had several days of IV fluid hydration. The chest x-ray revealed pulmonary congestion without frank edema. His diet was downgraded to dysphagia empirically. The speech therapist evaluated him and recommended a dysphagia 2 with nectar thickened liquids. His liquids were changed to honey thickened. The nursing staff is asked to assist the  patient with feeding and aspiration precautions. Urinary tract infection, per urinalysis. The patient was started on IV Cipro. He received 4-1/2 days of treatment. He was discharged on 5 more days of oral  Urine culture was ordered, but apparently a specimen was not sent. Left shoulder pain; status post fall at home. Orthopedic surgeon, Dr. Aline Brochure was consulted. He noted that the patient has a chronically torn left rotator cuff/rupture/tendinopathy from 2011. He recommended a shoulder sling. X-ray on admission revealed no fracture. He was treated with as needed hydrocodone and topical Voltaren. Hypertension/CAD/history of CABG. The patient was restarted on metoprolol, but ramipril was held due to acute renal failure. His blood pressure started trending up, so ramipril was restarted at a lower than home dose at the time of discharge. His Troponin I negative 3. Severe protein calorie malnutrition. He was started on Ensure. Mixed acidosis: Mild respiratory acidosis with primary metabolic acidosis. This was likely from starvation acidosis and/or acute renal failure and/or metformin induced although his lactic acid level was within normal limits. The mild respiratory acidosis was likely from atelectasis, but had resolved, status post BiPAP short-term. Acute renal failure/acute kidney injury.  His last creatinine was 1.02 during the previous hospitalization. It was 2.91 on admission. This was likely from prerenal from hypovolemia with concomitant ACE inhibitor therapy. His creatinine improved to baseline following IV fluid hydration, treating his urinary tract infection, and temporarily holding ramipril. Ramipril was restarted at a lower dose at the time of discharge. Recommend rechecking his renal function in 1 week. Diabetes mellitus with peripheral vascular disease and peripheral neuropathy. Metformin was initially withheld and he was treated with sliding scale  NovoLog. Gabapentin was  continued. At the time of discharge, sliding scale NovoLog was discontinued and he was restarted on metformin. Recommend checking his blood sugars at least once to twice daily at the skilled nursing facility. being withheld. Macrocytic Anemia. His vitamin B12 level was within normal limits. Likely chronic disease and in part dilutional from IV fluids. Multivitamin with iron was added added and the IV fluids were discontinued. His hemoglobin was 10.8 at the time of discharge.      Procedures:  None  Consultations:  Orthopedics, Dr. Aline Brochure  Discharge Exam: Filed Vitals:   09/15/14 1205  BP: 128/53  Pulse: 124  Temp:   Resp:    pulse repeated at 74. Respiratory rate 20. Oxygen saturation 95% on room air.   General: Alert 74 year old man laying in bed, in no acute distress.  Oropharynx with multiple missing teeth and poor dentition; mucous membranes are mildly dry  Cardiovascular: Distant S1, S2, with soft systolic murmur.  Respiratory: Coarse anterior breath sounds with no audible crackles or wheezes. Breathing nonlabored.  Abdomen: Hypoactive bowel sounds, soft, nontender, nondistended.  Musculoskeletal/extremities: Moderate tenderness of the left shoulder; warmth and some edema; patient resists moving it because of pain; left BKA stump intact.  Neurologic/psychiatric: He is alert and oriented to himself, wife and place. He does not recall being confused  overnight. His speech is clear and his affect is appropriate. Cranial nerves II through XII are grossly intact.    Discharge Instructions   Discharge Instructions    Diet - low sodium heart healthy    Complete by:  As directed   DYSPHAGIA 2 DIET WITH NECTAR THICKENED LIQUIDS. ASSIST WITH ALL MEALS.     Diet general    Complete by:  As directed      Discharge instructions    Complete by:  As directed   DYSPHAGIA 2 DIET WITH NECTAR THICKENED LIQUIDS. ASSIST WITH ALL MEALS TO AVOID ASPIRATION.     Increase  activity slowly    Complete by:  As directed           Current Discharge Medication List    START taking these medications   Details  ciprofloxacin (CIPRO) 500 MG tablet Take 1 tablet (500 mg total) by mouth 2 (two) times daily. For 5 more days    diclofenac sodium (VOLTAREN) 1 % GEL Apply 2 g topically 3 (three) times daily. To left shoulder    famotidine (PEPCID) 20 MG tablet Take 1 tablet (20 mg total) by mouth daily.    feeding supplement, ENSURE COMPLETE, (ENSURE COMPLETE) LIQD Take 237 mLs by mouth 2 (two) times daily between meals.    Multiple Vitamins-Iron (MULTIVITAMINS WITH IRON) TABS tablet Take 1 tablet by mouth daily.    OLANZapine (ZYPREXA) 2.5 MG tablet Take 1 tablet (2.5 mg total) by mouth at bedtime. Qty: 30 tablet, Refills: 0    potassium chloride SA (K-DUR,KLOR-CON) 10 MEQ tablet Take 1 tablet (10 mEq total) by mouth daily. For 2 weeks and then recheck serum potassium.    senna-docusate (SENOKOT-S) 8.6-50 MG per tablet Take 1 tablet by mouth at bedtime.    traZODone (DESYREL) 50 MG tablet Take 1 tablet (50 mg total) by mouth at bedtime. Qty: 30 tablet, Refills: 0      CONTINUE these medications which have CHANGED   Details  albuterol (PROAIR HFA) 108 (90 BASE) MCG/ACT inhaler Inhale 2 puffs into the lungs every 12 (twelve) hours. And then every 4 hours as needed.  diazepam (VALIUM) 5 MG tablet Take 2 tablets (10 mg total) by mouth every 6 (six) hours as needed for anxiety. Qty: 30 tablet, Refills: 0    HYDROcodone-acetaminophen (NORCO) 7.5-325 MG per tablet Take 1 tablet by mouth every 4 (four) hours as needed. Qty: 30 tablet, Refills: 0    ramipril (ALTACE) 5 MG capsule Take 1 capsule (5 mg total) by mouth every morning.      CONTINUE these medications which have NOT CHANGED   Details  aspirin EC 81 MG EC tablet Take 1 tablet (81 mg total) by mouth daily.    citalopram (CELEXA) 20 MG tablet Take 20 mg by mouth daily.     diphenhydrAMINE  (BENADRYL) 50 MG capsule Take one tablet at 7:30 AM 07/09/13 (take with last dose of Prednisone 50 mg) prior to procedure. Qty: 1 capsule, Refills: 0   Associated Diagnoses: Allergy to iodinated contrast, subsequent encounter    folic acid (FOLVITE) 1 MG tablet Take 1 tablet (1 mg total) by mouth daily.    gabapentin (NEURONTIN) 100 MG capsule Take 200 mg by mouth 3 (three) times daily.     metFORMIN (GLUCOPHAGE) 500 MG tablet Take 500 mg by mouth 2 (two) times daily with a meal.    metoprolol (LOPRESSOR) 50 MG tablet Take 50 mg by mouth 2 (two) times daily.    mupirocin ointment (BACTROBAN) 2 % Place 1 application into the nose 3 (three) times daily as needed.    Tamsulosin HCl (FLOMAX) 0.4 MG CAPS Take 0.4 mg by mouth 2 (two) times daily.     thiamine 100 MG tablet Take 1 tablet (100 mg total) by mouth daily.    nitroGLYCERIN (NITROLINGUAL) 0.4 MG/SPRAY spray Place 1 spray under the tongue every 5 (five) minutes as needed for chest pain.      STOP taking these medications     triazolam (HALCION) 0.25 MG tablet      zolpidem (AMBIEN) 10 MG tablet        Allergies  Allergen Reactions  . Iohexol      Code: HIVES, Desc: PT REPORTS THROAT SWELLING, HIVES AND ITCHING, 13 HR PRE-MED GIVEN AND SUCCESSFUL-ARS 07/15/07   . Penicillins Itching and Swelling    Throat swelling  . Erythromycin Swelling and Rash    Throat swelling      The results of significant diagnostics from this hospitalization (including imaging, microbiology, ancillary and laboratory) are listed below for reference.    Significant Diagnostic Studies: Dg Chest 1 View  09/09/2014   CLINICAL DATA:  Recent false, chest pain, initial encounter  EXAM: CHEST - 1 VIEW  COMPARISON:  08/26/2014  FINDINGS: Cardiac shadow is stable. Postsurgical changes are again seen. The lungs are well aerated bilaterally. Mild interstitial changes are again identified. No focal infiltrate is noted.  IMPRESSION: No acute abnormality seen.    Electronically Signed   By: Inez Catalina M.D.   On: 09/09/2014 19:58   Ct Head Wo Contrast  09/09/2014   CLINICAL DATA:  [place].  Hit head.  EXAM: CT HEAD WITHOUT CONTRAST  CT CERVICAL SPINE WITHOUT CONTRAST  TECHNIQUE: Multidetector CT imaging of the head and cervical spine was performed following the standard protocol without intravenous contrast. Multiplanar CT image reconstructions of the cervical spine were also generated.  COMPARISON:  08/26/2014  FINDINGS: CT HEAD FINDINGS  Stable age related cerebral atrophy, ventriculomegaly and periventricular white matter disease. No extra-axial fluid collections are identified. No CT findings for acute hemispheric infarction or intracranial hemorrhage.  No mass lesions. The brainstem and cerebellum are normal.  No acute skull fracture is identified. The paranasal sinuses and mastoid air cells are grossly clear. The globes are intact.  CT CERVICAL SPINE FINDINGS  Moderate degenerative cervical spondylosis with multilevel disc disease and facet disease. No acute bony findings. No abnormal prevertebral soft tissue swelling. The skullbase C1 and C1-2 articulations are maintained. Extensive carotid artery calcifications are noted. The lung apices are clear.  IMPRESSION: 1. Stable age related cerebral atrophy, ventriculomegaly and periventricular white matter disease. 2. No acute intracranial findings or skull fracture. 3. Degenerative cervical spondylosis but no acute cervical spine fracture.   Electronically Signed   By: Kalman Jewels M.D.   On: 09/09/2014 20:02   Ct Head Wo Contrast  08/26/2014   CLINICAL DATA:  74 year old male with altered mental status, found down. Initial encounter.  EXAM: CT HEAD WITHOUT CONTRAST  TECHNIQUE: Contiguous axial images were obtained from the base of the skull through the vertex without intravenous contrast.  COMPARISON:  Head CT without contrast 02/03/2012.  FINDINGS: Paranasal sinuses now are clear. Mastoids are clear. No acute  osseous abnormality identified.  Visualized orbit soft tissues are within normal limits. No acute scalp soft tissue finding identified.  Calcified atherosclerosis at the skull base. Cerebral volume is stable and within normal limits for age. No ventriculomegaly. No midline shift, mass effect, or evidence of intracranial mass lesion. No evidence of cortically based acute infarction identified. Gray-white matter differentiation is within normal limits throughout the brain. No acute intracranial hemorrhage identified. No suspicious intracranial vascular hyperdensity.  IMPRESSION: Stable and normal for age non contrast CT appearance of the brain.   Electronically Signed   By: Lars Pinks M.D.   On: 08/26/2014 12:59   Ct Cervical Spine Wo Contrast  09/09/2014   CLINICAL DATA:  [place].  Hit head.  EXAM: CT HEAD WITHOUT CONTRAST  CT CERVICAL SPINE WITHOUT CONTRAST  TECHNIQUE: Multidetector CT imaging of the head and cervical spine was performed following the standard protocol without intravenous contrast. Multiplanar CT image reconstructions of the cervical spine were also generated.  COMPARISON:  08/26/2014  FINDINGS: CT HEAD FINDINGS  Stable age related cerebral atrophy, ventriculomegaly and periventricular white matter disease. No extra-axial fluid collections are identified. No CT findings for acute hemispheric infarction or intracranial hemorrhage. No mass lesions. The brainstem and cerebellum are normal.  No acute skull fracture is identified. The paranasal sinuses and mastoid air cells are grossly clear. The globes are intact.  CT CERVICAL SPINE FINDINGS  Moderate degenerative cervical spondylosis with multilevel disc disease and facet disease. No acute bony findings. No abnormal prevertebral soft tissue swelling. The skullbase C1 and C1-2 articulations are maintained. Extensive carotid artery calcifications are noted. The lung apices are clear.  IMPRESSION: 1. Stable age related cerebral atrophy, ventriculomegaly and  periventricular white matter disease. 2. No acute intracranial findings or skull fracture. 3. Degenerative cervical spondylosis but no acute cervical spine fracture.   Electronically Signed   By: Kalman Jewels M.D.   On: 09/09/2014 20:02   US Renal  09/10/2014   CLINICAL DATA:  Acute renal failure, status post left nephrectomy  EXAM: RENAL/URINARY TRACT ULTRASOUND COMPLETE  COMPARISON:  09/01/2010  FINDINGS: Right Kidney:  Length: 12.4 cm. Echogenicity within normal limits. No mass or hydronephrosis visualized.  Left Kidney:  Surgically removed.  Bladder:  Appears normal for degree of bladder distention. Dependent echogenic material is noted within the bladder. This could be debris related to a UTI  or possible hemorrhage. Clinical correlation is recommended.  IMPRESSION: Status post left nephrectomy.  Normal-appearing right kidney.  Dependent debris within the bladder as described.   Electronically Signed   By: Inez Catalina M.D.   On: 09/10/2014 14:34   Dg Chest Port 1 View  09/12/2014   CLINICAL DATA:  Chest congestion, dementia  EXAM: PORTABLE CHEST - 1 VIEW  COMPARISON:  09/09/2013  FINDINGS: Cardiomediastinal silhouette is stable. Central mild vascular congestion without pulmonary edema. No segmental infiltrate. Status post median sternotomy again noted.  IMPRESSION: No acute infiltrate or pleural effusion. Central mild vascular congestion without pulmonary edema. Again noted status post median sternotomy.   Electronically Signed   By: Lahoma Crocker M.D.   On: 09/12/2014 12:27   Dg Chest Port 1 View  08/26/2014   CLINICAL DATA:  Confusion.  Patient unable to give medical history.  EXAM: PORTABLE CHEST - 1 VIEW  COMPARISON:  09/01/2010  FINDINGS: Mild bilateral interstitial thickening. There is no focal parenchymal opacity, pleural effusion, or pneumothorax. The heart and mediastinal contours are unremarkable. There is evidence of prior CABG.  The osseous structures are unremarkable.  IMPRESSION: Mild  bilateral interstitial thickening concerning for mild interstitial edema.   Electronically Signed   By: Kathreen Devoid   On: 08/26/2014 14:44   Dg Shoulder Left  09/09/2014   CLINICAL DATA:  Left shoulder pain.  Altered mental status.  EXAM: LEFT SHOULDER - 2+ VIEW  COMPARISON:  None.  FINDINGS: The joint spaces are maintained. No acute fractures identified. Mild AC joint degenerative changes are noted. The visualized left lung apex is clear. Dense carotid artery calcifications are noted on the left side.  IMPRESSION: Degenerative changes but no acute fracture.   Electronically Signed   By: Kalman Jewels M.D.   On: 09/09/2014 20:12    Microbiology: Recent Results (from the past 240 hour(s))  MRSA PCR Screening     Status: None   Collection Time: 09/09/14 10:15 PM  Result Value Ref Range Status   MRSA by PCR NEGATIVE NEGATIVE Final    Comment:        The GeneXpert MRSA Assay (FDA approved for NASAL specimens only), is one component of a comprehensive MRSA colonization surveillance program. It is not intended to diagnose MRSA infection nor to guide or monitor treatment for MRSA infections.      Labs: Basic Metabolic Panel:  Recent Labs Lab 09/10/14 1038 09/11/14 0401 09/12/14 0534 09/13/14 0539 09/15/14 0529  NA 136 138 136 137 136  K 4.8 4.7 4.1 3.6 3.3*  CL 107 112 108 105 104  CO2 24 21 21 23 24   GLUCOSE 93 95 98 97 122*  BUN 28* 24* 13 9 14   CREATININE 1.74* 1.27 1.03 1.10 1.29  CALCIUM 8.2* 8.1* 8.1* 8.3* 8.0*   Liver Function Tests:  Recent Labs Lab 09/09/14 2006  AST 16  ALT 11  ALKPHOS 60  BILITOT 0.6  PROT 6.7  ALBUMIN 2.8*   No results for input(s): LIPASE, AMYLASE in the last 168 hours.  Recent Labs Lab 09/09/14 2006  AMMONIA 19   CBC:  Recent Labs Lab 09/09/14 2006 09/10/14 1038 09/11/14 0401 09/12/14 0534 09/13/14 0539 09/15/14 0529  WBC 6.4 6.6 5.3 5.3 6.3 7.9  NEUTROABS 5.1  --   --   --   --   --   HGB 11.2* 11.0* 9.9* 10.2*  11.2* 10.8*  HCT 35.5* 35.1* 30.7* 31.1* 34.1* 32.1*  MCV 103.8* 103.8* 101.3* 98.4  97.4 96.4  PLT 330 322 292 348 365 335   Cardiac Enzymes:  Recent Labs Lab 09/09/14 2006 09/09/14 2217 09/10/14 0447 09/10/14 1038  TROPONINI 0.03 0.03 0.03 0.03   BNP: BNP (last 3 results) No results for input(s): PROBNP in the last 8760 hours. CBG:  Recent Labs Lab 09/14/14 1123 09/14/14 1623 09/14/14 2021 09/15/14 0805 09/15/14 1225  GLUCAP 207* 167* 167* 167* 186*       Signed:  Hadja Harral  [facility] 09/15/2014, 2:15 PM

## 2014-09-15 NOTE — Progress Notes (Signed)
Report called to Nursing at [facility].  Nurse verbalized understanding.  Patient left the floor via [facility] with packet with wife at the bedside the patient left the floor in stable condition.  IV removed prior to discharge.

## 2014-09-15 NOTE — Clinical Social Work Placement (Signed)
Clinical Social Work Department CLINICAL SOCIAL WORK PLACEMENT NOTE 09/15/2014  Patient:  [name], [name]  Account Number:  [id number] Admit date:  09/09/2014  Clinical Social Worker:  Ambrose Pancoast, LCSW  Date/time:  09/11/2014 12:18 PM  Clinical Social Work is seeking post-discharge placement for this patient at the following level of care:   SKILLED NURSING   (*CSW will update this form in Epic as items are completed)   09/11/2014  Patient/family provided with [place] Department of Clinical Social Work's list of facilities offering this level of care within the geographic area requested by the patient (or if unable, by the patient's family).  09/11/2014  Patient/family informed of their freedom to choose among providers that offer the needed level of care, that participate in Medicare, Medicaid or managed care program needed by the patient, have an available bed and are willing to accept the patient.    Patient/family informed of [facility]' ownership interest in [facility], as well as of the fact that they are under no obligation to receive care at this facility.  PASARR submitted to EDS on 09/11/2014 PASARR number received on 09/11/2014  FL2 transmitted to all facilities in geographic area requested by pt/family on  09/11/2014 FL2 transmitted to all facilities within larger geographic area on   Patient informed that his/her managed care company has contracts with or will negotiate with  certain facilities, including the following:     Patient/family informed of bed offers received:  09/15/2014 Patient chooses bed at [facility] Physician recommends and patient chooses bed at  [facility]  Patient to be transferred to [facility] on  09/15/2014 Patient to be transferred to facility by [facility] Patient and family notified of transfer on 09/15/2014 Name of family member notified:  wife  The following physician request were  entered in Epic:   Additional Comments:  [name], [place]

## 2014-09-15 NOTE — Evaluation (Signed)
Clinical/Bedside Swallow Evaluation Patient Details  Name: [name] MRN: [id number] DOB: 09-Sep-1940  Today's Date: 09/15/2014 Time:9:23 AM  - 9:58 AM    Past Medical History:  Past Medical History  Diagnosis Date  . Asthma   . Hypertension   . Ureter cancer 12/2001    "shut down my kidney & resulted in nephrectomy" (07/09/2013)  . Coronary artery disease   . High cholesterol   . Heart murmur     "had rheumatic fever as a kid" (07/09/2013)  . Rheumatic fever   . Myocardial infarction 03/2002; ~ 2011  . Anginal pain   . Pneumonia     "once" (07/09/2013)  . Shortness of breath     "can happen at anytime" (07/09/2013)  . COPD (chronic obstructive pulmonary disease)     pt denies this hx on 07/09/2013  . Type II diabetes mellitus   . H/O hiatal hernia 1997  . Arthritis     "qwhere" (07/09/2013)  . Gout   . Renal disorder     "only have 1 kidney; due to go back soon to check the other one" (07/09/2013)  . RUPTURE ROTATOR CUFF 12/03/2009    Qualifier: Diagnosis of  By: Aline Brochure MD, [name]     Past Surgical History:  Past Surgical History  Procedure Laterality Date  . Nephrectomy Left 12/19/2001  . Femoral artery stent Right 07/09/2013  . Cholecystectomy  1980's  . Coronary artery bypass graft  ~ 2010    "CABG X5" (07/09/2013)  . Glaucoma surgery Bilateral ?[id number]  . Eye surgery    . Carpal tunnel release Bilateral ~2004; ~ 2011  . Trigger finger release Left ~ 1990's  . Cardiac catheterization  ~ 2010    "before the bypass" (07/09/2013)  . Below knee leg amputation Left 08/2009  . Above knee leg amputation Left 09/2009  . Lower extremity angiogram Right Nov. 4, 2014  . Lower extremity angiogram Right 07/09/2013    Procedure: LOWER EXTREMITY ANGIOGRAM;  Surgeon: [name] Mitchell, MD;  Location: [facility] CATH LAB;  Service: Cardiovascular;  Laterality: Right;   HPI:  Mr. [name] is a 74 yo male with hx of hypertension, CAD, COPD who presented to the ED with altered  mental status. Per ED reports pt stopped drinking a couple of weeks ago, For the last several days the patient has been sitting in his chair rarely moving. Pt has not been eating well for the past several days and appeared weak and confused, Wife called EMS and pt was given narcan with slight improvement. Pt denies taking extra medication. CT brain in ER negative for any acute process. Pt was noted to be in acute renal failure with pH of 7.188. Pt will be admitted for acute renal failure. He was found to have a UTI. 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 He has had acute encephalopathy due to UTI.SLP asked to evaluated swallow function due to reports of pt couging after meals.   Assessment/Recommendations/Treatment Plan    SLP Assessment Clinical Impression Statement: Mr. [name] presents with mild/mod oral phase dysphagia which negatively impacts pharyngeal phase characterized by seemingly reduced lingual and labial sensation, reduced lingual strength, and overall poor awareness orally resulting in lingual residuals (thick coating after each bite) and labial spillage (slight) on the right side. Pt initially tolerated thin liquids well, however when challenged with straw sips he coughed significantly. Pt tolerated regular textures, but required  liquid wash to clear which also elicited some coughing. Suspect current global weakness (oropharyngeal) is negatively impacted safe swallow function. Recommend D2/chopped with NTL (nectar-thick liquids) with supervision and assist for meals. Pt would benefit from follow up SLP services in SNF for diet tolerance, upgrades as appropriate, and oral strengthening. SLP will sign off and defer to next level of care. Risk for Aspiration: Mild Other Related Risk Factors: Cognitive impairment  Swallow Evaluation Recommendations Diet Recommendations: Dysphagia 2  (Fine chop), Nectar-thick liquid Liquid Administration via: Cup, Straw Medication Administration: Whole meds with liquid Supervision: Staff to assist with self feeding, Intermittent supervision to cue for compensatory strategies Compensations: Slow rate, Small sips/bites, Multiple dry swallows after each bite/sip, Follow solids with liquid, Effortful swallow Postural Changes and/or Swallow Maneuvers: Seated upright 90 degrees, Upright 30-60 min after meal Oral Care Recommendations: Oral care BID Other Recommendations: Order thickener from pharmacy, Clarify dietary restrictions Follow up Recommendations: Skilled Nursing facility  Treatment Plan Treatment Plan Recommendations: Defer treatment plan to SLP at (Comment)SNF; [facility]  Prognosis Prognosis for Safe Diet Advancement: Fair Barriers to Reach Goals: Cognitive deficits  Individuals Consulted Consulted and Agree with Results and Recommendations: Patient, RN, MD  Swallowing Goals   Defer to next level of care  Swallow Study Prior Functional Status   Lives at home with his wife; consumes regular diet with thin liquids  General  Date of Onset: 09/09/14 HPI: Mr. [name] is a 74 yo male with hx of hypertension, CAD, COPD who presented to the ED with altered mental status. Per ED reports pt stopped drinking a couple of weeks ago, For the last several days the patient has been sitting in his chair rarely moving. Pt has not been eating well for the past several days and appeared weak and confused, Wife called EMS and pt was given narcan with slight improvement. Pt denies taking extra medication. CT brain in ER negative for any acute process. Pt was noted to be in acute renal failure with pH of 7.188. Pt will be admitted for acute renal failure. He was found to have a UTI. 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 He has had acute  encephalopathy due to UTI.SLP asked to evaluated swallow function due to reports of pt couging after meals. Type of Study: Bedside swallow evaluation Previous Swallow Assessment: N/A Diet Prior to this Study: Dysphagia 1 (puree), Honey-thick liquids Temperature Spikes Noted: No Respiratory Status: Room air History of Recent Intubation: No Behavior/Cognition: Alert, Cooperative, Pleasant mood Oral Cavity - Dentition: Poor condition (missing upper dentures; sparse lower) Self-Feeding Abilities: Needs assist (due to upper extremety weakness) Patient Positioning: Upright in bed Baseline Vocal Quality: Clear, Hoarse Volitional Cough: Strong, Congested Volitional Swallow: Able to elicit  Oral Motor/Sensory Function  Overall Oral Motor/Sensory Function: Impaired Labial ROM: Within Functional Limits Labial Symmetry: Within Functional Limits Labial Strength: Within Functional Limits Labial Sensation: Reduced Lingual ROM: Within Functional Limits Lingual Symmetry: Within Functional Limits Lingual Strength: Reduced Lingual Sensation: Reduced Facial ROM: Within Functional Limits Facial Symmetry: Within Functional Limits Facial Strength: Within Functional Limits Facial Sensation: Within Functional Limits Velum: Within Functional Limits Mandible: Within Functional Limits  Consistency Results  Ice Chips Ice chips: Within functional limits Presentation: Spoon  Thin Liquid Thin Liquid: Impaired Presentation: Cup;Straw;Self Fed Pharyngeal  Phase Impairments: Cough - Immediate;Decreased hyoid-laryngeal movement  Nectar Thick Liquid Nectar Thick Liquid: Within functional limits Presentation: Cup;Straw;Self Fed  Honey Thick Liquid Honey  Thick Liquid: Within functional limits Presentation: Cup;Self fed  Puree Puree: Impaired Presentation: Spoon Oral Phase Impairments: Reduced lingual movement/coordination;Poor awareness of bolus Oral Phase Functional Implications: Oral  residue  Solid Solid: Impaired Presentation: Spoon Oral Phase Impairments: Reduced lingual movement/coordination;Reduced labial seal;Impaired mastication Oral Phase Functional Implications: Right anterior spillage;Oral residue Pharyngeal Phase Impairments: Throat Clearing - Delayed  Thank you,  Genene Churn, [place]  Shanise Balch 09/15/2014,2:18 PM

## 2014-09-15 NOTE — Clinical Social Work Note (Signed)
Pt d/c to [facility] today. Pt and pt's wife aware and agreeable per Ambrose Pancoast, CSW. Facility also notified. D/C summary faxed. Pt to transfer via [facility].  [name], [place]

## 2014-09-16 ENCOUNTER — Encounter (HOSPITAL_COMMUNITY): Payer: Self-pay | Admitting: *Deleted

## 2014-09-16 ENCOUNTER — Emergency Department (HOSPITAL_COMMUNITY)
Admission: EM | Admit: 2014-09-16 | Discharge: 2014-09-16 | Disposition: A | Discharge status: Home / Self Care | Payer: Medicare Other | Attending: Emergency Medicine | Admitting: Emergency Medicine

## 2014-09-16 ENCOUNTER — Emergency Department (HOSPITAL_COMMUNITY): Payer: Medicare Other

## 2014-09-16 DIAGNOSIS — S0081XA Abrasion of other part of head, initial encounter: Secondary | ICD-10-CM | POA: Diagnosis not present

## 2014-09-16 DIAGNOSIS — M1990 Unspecified osteoarthritis, unspecified site: Secondary | ICD-10-CM | POA: Insufficient documentation

## 2014-09-16 DIAGNOSIS — Y9389 Activity, other specified: Secondary | ICD-10-CM | POA: Insufficient documentation

## 2014-09-16 DIAGNOSIS — S6991XA Unspecified injury of right wrist, hand and finger(s), initial encounter: Secondary | ICD-10-CM | POA: Diagnosis not present

## 2014-09-16 DIAGNOSIS — J449 Chronic obstructive pulmonary disease, unspecified: Secondary | ICD-10-CM | POA: Diagnosis not present

## 2014-09-16 DIAGNOSIS — R279 Unspecified lack of coordination: Secondary | ICD-10-CM | POA: Diagnosis not present

## 2014-09-16 DIAGNOSIS — Z79899 Other long term (current) drug therapy: Secondary | ICD-10-CM | POA: Diagnosis not present

## 2014-09-16 DIAGNOSIS — I10 Essential (primary) hypertension: Secondary | ICD-10-CM | POA: Diagnosis not present

## 2014-09-16 DIAGNOSIS — S098XXA Other specified injuries of head, initial encounter: Secondary | ICD-10-CM | POA: Diagnosis not present

## 2014-09-16 DIAGNOSIS — I252 Old myocardial infarction: Secondary | ICD-10-CM | POA: Diagnosis not present

## 2014-09-16 DIAGNOSIS — J45909 Unspecified asthma, uncomplicated: Secondary | ICD-10-CM | POA: Diagnosis not present

## 2014-09-16 DIAGNOSIS — Z792 Long term (current) use of antibiotics: Secondary | ICD-10-CM | POA: Diagnosis not present

## 2014-09-16 DIAGNOSIS — Z87448 Personal history of other diseases of urinary system: Secondary | ICD-10-CM | POA: Insufficient documentation

## 2014-09-16 DIAGNOSIS — R51 Headache: Secondary | ICD-10-CM | POA: Diagnosis not present

## 2014-09-16 DIAGNOSIS — E119 Type 2 diabetes mellitus without complications: Secondary | ICD-10-CM | POA: Insufficient documentation

## 2014-09-16 DIAGNOSIS — Y929 Unspecified place or not applicable: Secondary | ICD-10-CM | POA: Insufficient documentation

## 2014-09-16 DIAGNOSIS — I2510 Atherosclerotic heart disease of native coronary artery without angina pectoris: Secondary | ICD-10-CM | POA: Diagnosis not present

## 2014-09-16 DIAGNOSIS — Z8554 Personal history of malignant neoplasm of ureter: Secondary | ICD-10-CM | POA: Diagnosis not present

## 2014-09-16 DIAGNOSIS — M79641 Pain in right hand: Secondary | ICD-10-CM | POA: Diagnosis not present

## 2014-09-16 DIAGNOSIS — W19XXXA Unspecified fall, initial encounter: Secondary | ICD-10-CM

## 2014-09-16 DIAGNOSIS — Z743 Need for continuous supervision: Secondary | ICD-10-CM | POA: Diagnosis not present

## 2014-09-16 DIAGNOSIS — Y998 Other external cause status: Secondary | ICD-10-CM | POA: Diagnosis not present

## 2014-09-16 DIAGNOSIS — Z720 Tobacco use: Secondary | ICD-10-CM | POA: Diagnosis not present

## 2014-09-16 DIAGNOSIS — Z7982 Long term (current) use of aspirin: Secondary | ICD-10-CM | POA: Diagnosis not present

## 2014-09-16 DIAGNOSIS — R011 Cardiac murmur, unspecified: Secondary | ICD-10-CM | POA: Insufficient documentation

## 2014-09-16 DIAGNOSIS — M25531 Pain in right wrist: Secondary | ICD-10-CM | POA: Diagnosis not present

## 2014-09-16 DIAGNOSIS — S0990XA Unspecified injury of head, initial encounter: Secondary | ICD-10-CM

## 2014-09-16 NOTE — ED Notes (Signed)
Pt fell from wheelchair today at [facility], abrasion noted to right forehead, pt alert, denies LOC

## 2014-09-16 NOTE — Discharge Instructions (Signed)

## 2014-09-16 NOTE — ED Notes (Signed)
Pt still had previous id bracelet on and was cut off, pt with previous iv still in place to left posterior forearm

## 2014-09-16 NOTE — ED Notes (Signed)
Pt alert and oriented to year and place, thinks the month is February

## 2014-09-16 NOTE — ED Provider Notes (Signed)
TIME SEEN: 5:48 PM  CHIEF COMPLAINT: Fall  HPI:  Pt is a 74 y.o. M with h/o of recent admission for altered mental status likely secondary to UTI, alcoholic dementia with sundowning, recent acute renal failure thought secondary to metformin use that has improved with a creatinine of 1.29 on 09/15/14, HTN, DM, HLD, CAD, and COPD who presents to the ED for a fall PTA. Pt notes he was trying to ease himself out of his wheelchair when he fell. Pt notes head injury to right forehead and right hand injury with associated pain and swelling.  Pt has a chronic left rotator cuff injury that he is in a sling for (he has had recent negative left shoulder x-rays). Pt has a left AKA. Pt states he has a prosthesis but does not use it, stating he cannot straighten his right leg.  Pt notes he may have a rectal infection.  Pt is A&O x3.  Pt denies LOC and any other injury. Denies any pain. He does continuously tell me that he fell at home but it appears patient is currently living out of onto a rehabilitation facility. He had unremarkable labs yesterday on 09/15/14.  ROS: Level V caveat for dementia  PAST MEDICAL HISTORY/PAST SURGICAL HISTORY:  Past Medical History  Diagnosis Date  . Asthma   . Hypertension   . Ureter cancer 12/2001    "shut down my kidney & resulted in nephrectomy" (07/09/2013)  . Coronary artery disease   . High cholesterol   . Heart murmur     "had rheumatic fever as a kid" (07/09/2013)  . Rheumatic fever   . Myocardial infarction 03/2002; ~ 2011  . Anginal pain   . Pneumonia     "once" (07/09/2013)  . Shortness of breath     "can happen at anytime" (07/09/2013)  . COPD (chronic obstructive pulmonary disease)     pt denies this hx on 07/09/2013  . Type II diabetes mellitus   . H/O hiatal hernia 1997  . Arthritis     "qwhere" (07/09/2013)  . Gout   . Renal disorder     "only have 1 kidney; due to go back soon to check the other one" (07/09/2013)  . RUPTURE ROTATOR CUFF 12/03/2009     Qualifier: Diagnosis of  By: Aline Brochure MD, Dorothyann Peng      MEDICATIONS:  Prior to Admission medications   Medication Sig Start Date End Date Taking? Authorizing Provider  albuterol (PROAIR HFA) 108 (90 BASE) MCG/ACT inhaler Inhale 2 puffs into the lungs every 12 (twelve) hours. And then every 4 hours as needed. 09/15/14   Rexene Alberts, MD  aspirin EC 81 MG EC tablet Take 1 tablet (81 mg total) by mouth daily. 07/10/13   Regina J Roczniak, PA-C  ciprofloxacin (CIPRO) 500 MG tablet Take 1 tablet (500 mg total) by mouth 2 (two) times daily. For 5 more days 09/15/14   Rexene Alberts, MD  citalopram (CELEXA) 20 MG tablet Take 20 mg by mouth daily.     Historical Provider, MD  diazepam (VALIUM) 5 MG tablet Take 2 tablets (10 mg total) by mouth every 6 (six) hours as needed for anxiety. 09/15/14   Rexene Alberts, MD  diclofenac sodium (VOLTAREN) 1 % GEL Apply 2 g topically 3 (three) times daily. To left shoulder 09/15/14   Rexene Alberts, MD  diphenhydrAMINE (BENADRYL) 50 MG capsule Take one tablet at 7:30 AM 07/09/13 (take with last dose of Prednisone 50 mg) prior to procedure. 07/05/13   Durene Fruits  Pierre Bali, MD  famotidine (PEPCID) 20 MG tablet Take 1 tablet (20 mg total) by mouth daily. 09/15/14   Rexene Alberts, MD  feeding supplement, ENSURE COMPLETE, (ENSURE COMPLETE) LIQD Take 237 mLs by mouth 2 (two) times daily between meals. 09/15/14   Rexene Alberts, MD  folic acid (FOLVITE) 1 MG tablet Take 1 tablet (1 mg total) by mouth daily. 08/27/14   Erline Hau, MD  gabapentin (NEURONTIN) 100 MG capsule Take 200 mg by mouth 3 (three) times daily.     Historical Provider, MD  HYDROcodone-acetaminophen (NORCO) 7.5-325 MG per tablet Take 1 tablet by mouth every 4 (four) hours as needed. 09/15/14   Rexene Alberts, MD  metFORMIN (GLUCOPHAGE) 500 MG tablet Take 500 mg by mouth 2 (two) times daily with a meal.    Historical Provider, MD  metoprolol (LOPRESSOR) 50 MG tablet Take 50 mg by mouth 2 (two) times daily.     Historical Provider, MD  Multiple Vitamins-Iron (MULTIVITAMINS WITH IRON) TABS tablet Take 1 tablet by mouth daily. 09/15/14   Rexene Alberts, MD  mupirocin ointment (BACTROBAN) 2 % Place 1 application into the nose 3 (three) times daily as needed.    Historical Provider, MD  nitroGLYCERIN (NITROLINGUAL) 0.4 MG/SPRAY spray Place 1 spray under the tongue every 5 (five) minutes as needed for chest pain.    Historical Provider, MD  OLANZapine (ZYPREXA) 2.5 MG tablet Take 1 tablet (2.5 mg total) by mouth at bedtime. 09/15/14   Rexene Alberts, MD  potassium chloride SA (K-DUR,KLOR-CON) 10 MEQ tablet Take 1 tablet (10 mEq total) by mouth daily. For 2 weeks and then recheck serum potassium. 09/15/14   Rexene Alberts, MD  ramipril (ALTACE) 5 MG capsule Take 1 capsule (5 mg total) by mouth every morning. 09/15/14   Rexene Alberts, MD  senna-docusate (SENOKOT-S) 8.6-50 MG per tablet Take 1 tablet by mouth at bedtime. 09/15/14   Rexene Alberts, MD  Tamsulosin HCl (FLOMAX) 0.4 MG CAPS Take 0.4 mg by mouth 2 (two) times daily.     Historical Provider, MD  thiamine 100 MG tablet Take 1 tablet (100 mg total) by mouth daily. 08/27/14   Erline Hau, MD  traZODone (DESYREL) 50 MG tablet Take 1 tablet (50 mg total) by mouth at bedtime. 09/15/14   Rexene Alberts, MD    ALLERGIES:  Allergies  Allergen Reactions  . Iohexol      Code: HIVES, Desc: PT REPORTS THROAT SWELLING, HIVES AND ITCHING, 13 HR PRE-MED GIVEN AND SUCCESSFUL-ARS 07/15/07   . Penicillins Itching and Swelling    Throat swelling  . Erythromycin Swelling and Rash    Throat swelling    SOCIAL HISTORY:  History  Substance Use Topics  . Smoking status: Current Every Day Smoker -- 1.00 packs/day for 54 years    Types: Cigarettes  . Smokeless tobacco: Never Used  . Alcohol Use: 0.0 oz/week     Comment: social drinker    FAMILY HISTORY: Family History  Problem Relation Age of Onset  . Cancer Mother   . Heart attack Father     EXAM: BP  107/73 mmHg  Pulse 84  Temp(Src) 97.5 F (36.4 C) (Oral)  Resp 20  Ht 6\' 3"  (1.905 m)  Wt 200 lb (90.719 kg)  BMI 25.00 kg/m2  SpO2 92% CONSTITUTIONAL: Alert and oriented and responds appropriately to questions. Well-appearing; well-nourished, oriented x 3 HEAD: Normocephalic; small abrasion to the right forehead with small hematoma EYES: Conjunctivae clear, PERRL ENT: normal nose;  no rhinorrhea; moist mucous membranes; pharynx without lesions noted; no septal hematoma, no dental injury NECK: Supple, no meningismus, no LAD; no midline spinal tenderness or step-off or deformity CARD: RRR; S1 and S2 appreciated; no murmurs, no clicks, no rubs, no gallops RESP: Normal chest excursion without splinting or tachypnea; breath sounds clear and equal bilaterally; no wheezes, no rhonchi, no rales, no hypoxia or respiratory distress ABD/GI: Normal bowel sounds; non-distended; soft, non-tender, no rebound, no guarding RECTUM:  Patient has some erythema over the rectal area with some excoriations but no warmth or drainage or fluctuance or tenderness BACK:  The back appears normal and is non-tender to palpation, there is no CVA tenderness; no midline spinal tenderness or step-off or deformity TMY:TRZNB AKA; right wrist in ace wrap with some swelling over the second MCP with no obvious bony tenderness; left shoulder in sling with no bony tenderness; sensation to light touch intact diffusely, 2+ radial pulses bilaterally; Otherwise Normal ROM in all joints; non-tender to palpation; no edema; normal capillary refill; no cyanosis   SKIN: Normal color for age and race; warm NEURO: Moves all extremities equally PSYCH: The patient's mood and manner are appropriate. Grooming and personal hygiene are appropriate.   MEDICAL DECISION MAKING: Patient here with fall out of his wheelchair. He is hemodynamically stable, appears neurologically intact and has no complaints. He does have an abrasion to the right forehead.  Will obtain a CT of his head and cervical spine. He also has swelling over the second MCP on the right and his wrist is in a Ace wrap. Will obtain x-rays of the wrist and hand as this does not appear that has been previously done and is unclear when he injured this area. Denies pain medicine at this time. He had unremarkable labs drawn yesterday. I do not feel these need to be repeated today.  ED PROGRESS: Imaging is unremarkable. Will discharge back to his rehabilitation facility.      [place], DO 09/16/14 1904

## 2014-09-16 NOTE — Care Management Utilization Note (Signed)
UR completed 

## 2014-09-17 DIAGNOSIS — M75102 Unspecified rotator cuff tear or rupture of left shoulder, not specified as traumatic: Secondary | ICD-10-CM | POA: Diagnosis not present

## 2014-09-17 DIAGNOSIS — W07XXXD Fall from chair, subsequent encounter: Secondary | ICD-10-CM | POA: Diagnosis not present

## 2014-09-17 DIAGNOSIS — R52 Pain, unspecified: Secondary | ICD-10-CM | POA: Diagnosis not present

## 2014-09-17 DIAGNOSIS — E559 Vitamin D deficiency, unspecified: Secondary | ICD-10-CM | POA: Diagnosis not present

## 2014-09-17 DIAGNOSIS — Z87891 Personal history of nicotine dependence: Secondary | ICD-10-CM | POA: Diagnosis not present

## 2014-09-17 DIAGNOSIS — N400 Enlarged prostate without lower urinary tract symptoms: Secondary | ICD-10-CM | POA: Diagnosis not present

## 2014-09-21 DIAGNOSIS — N390 Urinary tract infection, site not specified: Secondary | ICD-10-CM | POA: Diagnosis not present

## 2014-09-21 DIAGNOSIS — N183 Chronic kidney disease, stage 3 (moderate): Secondary | ICD-10-CM | POA: Diagnosis not present

## 2014-09-29 DIAGNOSIS — R52 Pain, unspecified: Secondary | ICD-10-CM | POA: Diagnosis not present

## 2014-09-29 DIAGNOSIS — N183 Chronic kidney disease, stage 3 (moderate): Secondary | ICD-10-CM | POA: Diagnosis not present

## 2014-09-30 DIAGNOSIS — M109 Gout, unspecified: Secondary | ICD-10-CM | POA: Diagnosis not present

## 2014-09-30 DIAGNOSIS — F39 Unspecified mood [affective] disorder: Secondary | ICD-10-CM | POA: Diagnosis not present

## 2014-09-30 DIAGNOSIS — E875 Hyperkalemia: Secondary | ICD-10-CM | POA: Diagnosis not present

## 2014-09-30 DIAGNOSIS — N183 Chronic kidney disease, stage 3 (moderate): Secondary | ICD-10-CM | POA: Diagnosis not present

## 2014-10-03 DIAGNOSIS — F39 Unspecified mood [affective] disorder: Secondary | ICD-10-CM | POA: Diagnosis not present

## 2014-10-03 DIAGNOSIS — E875 Hyperkalemia: Secondary | ICD-10-CM | POA: Diagnosis not present

## 2014-10-03 DIAGNOSIS — M109 Gout, unspecified: Secondary | ICD-10-CM | POA: Diagnosis not present

## 2014-10-03 DIAGNOSIS — N183 Chronic kidney disease, stage 3 (moderate): Secondary | ICD-10-CM | POA: Diagnosis not present

## 2014-10-07 DIAGNOSIS — R8299 Other abnormal findings in urine: Secondary | ICD-10-CM | POA: Diagnosis not present

## 2014-10-08 DIAGNOSIS — E875 Hyperkalemia: Secondary | ICD-10-CM | POA: Diagnosis not present

## 2014-10-08 DIAGNOSIS — N390 Urinary tract infection, site not specified: Secondary | ICD-10-CM | POA: Diagnosis not present

## 2014-10-08 DIAGNOSIS — R8299 Other abnormal findings in urine: Secondary | ICD-10-CM | POA: Diagnosis not present

## 2014-10-08 DIAGNOSIS — B3789 Other sites of candidiasis: Secondary | ICD-10-CM | POA: Diagnosis not present

## 2014-10-08 DIAGNOSIS — N183 Chronic kidney disease, stage 3 (moderate): Secondary | ICD-10-CM | POA: Diagnosis not present

## 2014-10-13 DIAGNOSIS — R3989 Other symptoms and signs involving the genitourinary system: Secondary | ICD-10-CM | POA: Diagnosis not present

## 2014-10-13 DIAGNOSIS — N183 Chronic kidney disease, stage 3 (moderate): Secondary | ICD-10-CM | POA: Diagnosis not present

## 2014-10-13 DIAGNOSIS — N390 Urinary tract infection, site not specified: Secondary | ICD-10-CM | POA: Diagnosis not present

## 2014-10-13 DIAGNOSIS — B3789 Other sites of candidiasis: Secondary | ICD-10-CM | POA: Diagnosis not present

## 2014-10-14 DIAGNOSIS — N183 Chronic kidney disease, stage 3 (moderate): Secondary | ICD-10-CM | POA: Diagnosis not present

## 2014-10-14 DIAGNOSIS — N390 Urinary tract infection, site not specified: Secondary | ICD-10-CM | POA: Diagnosis not present

## 2014-10-14 DIAGNOSIS — E875 Hyperkalemia: Secondary | ICD-10-CM | POA: Diagnosis not present

## 2014-10-14 DIAGNOSIS — B3789 Other sites of candidiasis: Secondary | ICD-10-CM | POA: Diagnosis not present

## 2014-10-14 DIAGNOSIS — R8299 Other abnormal findings in urine: Secondary | ICD-10-CM | POA: Diagnosis not present

## 2014-10-15 DIAGNOSIS — N183 Chronic kidney disease, stage 3 (moderate): Secondary | ICD-10-CM | POA: Diagnosis not present

## 2014-10-15 DIAGNOSIS — R3989 Other symptoms and signs involving the genitourinary system: Secondary | ICD-10-CM | POA: Diagnosis not present

## 2014-10-15 DIAGNOSIS — B3789 Other sites of candidiasis: Secondary | ICD-10-CM | POA: Diagnosis not present

## 2014-10-15 DIAGNOSIS — N390 Urinary tract infection, site not specified: Secondary | ICD-10-CM | POA: Diagnosis not present

## 2014-10-16 DIAGNOSIS — B3789 Other sites of candidiasis: Secondary | ICD-10-CM | POA: Diagnosis not present

## 2014-10-16 DIAGNOSIS — E875 Hyperkalemia: Secondary | ICD-10-CM | POA: Diagnosis not present

## 2014-10-16 DIAGNOSIS — N183 Chronic kidney disease, stage 3 (moderate): Secondary | ICD-10-CM | POA: Diagnosis not present

## 2014-10-16 DIAGNOSIS — N390 Urinary tract infection, site not specified: Secondary | ICD-10-CM | POA: Diagnosis not present

## 2014-10-17 DIAGNOSIS — N390 Urinary tract infection, site not specified: Secondary | ICD-10-CM | POA: Diagnosis not present

## 2014-10-17 DIAGNOSIS — E875 Hyperkalemia: Secondary | ICD-10-CM | POA: Diagnosis not present

## 2014-10-17 DIAGNOSIS — N183 Chronic kidney disease, stage 3 (moderate): Secondary | ICD-10-CM | POA: Diagnosis not present

## 2014-10-17 DIAGNOSIS — B3789 Other sites of candidiasis: Secondary | ICD-10-CM | POA: Diagnosis not present

## 2014-10-18 DIAGNOSIS — T1490 Injury, unspecified: Secondary | ICD-10-CM | POA: Diagnosis not present

## 2014-12-22 ENCOUNTER — Telehealth: Payer: Self-pay

## 2014-12-22 DIAGNOSIS — L98499 Non-pressure chronic ulcer of skin of other sites with unspecified severity: Principal | ICD-10-CM

## 2014-12-22 DIAGNOSIS — I70209 Unspecified atherosclerosis of native arteries of extremities, unspecified extremity: Secondary | ICD-10-CM

## 2014-12-22 DIAGNOSIS — I998 Other disorder of circulatory system: Secondary | ICD-10-CM

## 2014-12-22 DIAGNOSIS — I739 Peripheral vascular disease, unspecified: Secondary | ICD-10-CM

## 2014-12-22 NOTE — Telephone Encounter (Signed)
Phone call from pt.  Reported he has scabs on top of toes 2-5 on right foot that started approx. 3 wks. ago.  Also reported he has no recall of any injury to the toes, prior to the scab formation.  Denies redness or drainage.  Reported that there is also increased pain in right leg and foot with rest.  Noted pt. Missed his 6 mo. F/u appt. In 05/2014; stated he doesn't remember why he cancelled the appt.  Will reschedule for missed vasc. studies with ABI's and right LE Art duplex, and to see provider, within the next week.

## 2014-12-22 NOTE — Telephone Encounter (Signed)
Spoke with patient, dpm °

## 2014-12-24 ENCOUNTER — Encounter (HOSPITAL_COMMUNITY): Payer: PRIVATE HEALTH INSURANCE

## 2014-12-24 ENCOUNTER — Other Ambulatory Visit (HOSPITAL_COMMUNITY): Payer: PRIVATE HEALTH INSURANCE

## 2014-12-25 ENCOUNTER — Encounter (HOSPITAL_COMMUNITY): Payer: PRIVATE HEALTH INSURANCE

## 2014-12-25 ENCOUNTER — Other Ambulatory Visit (HOSPITAL_COMMUNITY): Payer: PRIVATE HEALTH INSURANCE

## 2014-12-26 ENCOUNTER — Ambulatory Visit (HOSPITAL_COMMUNITY)
Admission: RE | Admit: 2014-12-26 | Discharge: 2014-12-26 | Disposition: A | Discharge status: Home / Self Care | Payer: Medicare Other | Source: Ambulatory Visit | Attending: Surgery | Admitting: Surgery

## 2014-12-26 ENCOUNTER — Ambulatory Visit (INDEPENDENT_AMBULATORY_CARE_PROVIDER_SITE_OTHER)
Admission: RE | Admit: 2014-12-26 | Discharge: 2014-12-26 | Disposition: A | Discharge status: Home / Self Care | Payer: Medicare Other | Source: Ambulatory Visit | Attending: Surgery | Admitting: Surgery

## 2014-12-26 DIAGNOSIS — I70209 Unspecified atherosclerosis of native arteries of extremities, unspecified extremity: Secondary | ICD-10-CM

## 2014-12-26 DIAGNOSIS — L98499 Non-pressure chronic ulcer of skin of other sites with unspecified severity: Secondary | ICD-10-CM | POA: Diagnosis not present

## 2014-12-26 DIAGNOSIS — M79606 Pain in leg, unspecified: Secondary | ICD-10-CM

## 2014-12-26 DIAGNOSIS — I739 Peripheral vascular disease, unspecified: Secondary | ICD-10-CM

## 2014-12-30 ENCOUNTER — Encounter: Payer: Self-pay | Admitting: Surgery

## 2014-12-31 ENCOUNTER — Encounter: Payer: Self-pay | Admitting: Surgery

## 2014-12-31 ENCOUNTER — Ambulatory Visit (INDEPENDENT_AMBULATORY_CARE_PROVIDER_SITE_OTHER): Payer: Medicare Other | Admitting: Surgery

## 2014-12-31 VITALS — BP 124/79 | HR 80 | Ht 75.0 in | Wt 200.0 lb

## 2014-12-31 DIAGNOSIS — I739 Peripheral vascular disease, unspecified: Secondary | ICD-10-CM | POA: Diagnosis not present

## 2014-12-31 DIAGNOSIS — L98499 Non-pressure chronic ulcer of skin of other sites with unspecified severity: Secondary | ICD-10-CM | POA: Diagnosis not present

## 2014-12-31 DIAGNOSIS — I70209 Unspecified atherosclerosis of native arteries of extremities, unspecified extremity: Secondary | ICD-10-CM

## 2014-12-31 NOTE — Progress Notes (Signed)
Patient name: [name] MRN: [id number] DOB: October 14, 1940 Sex: male     Chief Complaint  Patient presents with  . Re-evaluation    6 month f/u     HISTORY OF PRESENT ILLNESS: The patient is back today for followup of his right leg pain. He underwent stenting of his right superficial femoral artery on 07/09/2013. He has a chronic contracture of his right he has a left above-knee amputation but has not been able to use his prosthesis. He also complains swelling in his right leg as well as feeling very cold.knee.  He recently developed a small source on the toes of his right foot which are very painful.  The patient continues to smoke.  He remains a diabetic.  He takes Neurontin for his neuropathic pain.  He has a history of coronary artery disease.  Past Medical History  Diagnosis Date  . Asthma   . Hypertension   . Ureter cancer 12/2001    "shut down my kidney & resulted in nephrectomy" (07/09/2013)  . Coronary artery disease   . High cholesterol   . Heart murmur     "had rheumatic fever as a kid" (07/09/2013)  . Rheumatic fever   . Myocardial infarction 03/2002; ~ 2011  . Anginal pain   . Pneumonia     "once" (07/09/2013)  . Shortness of breath     "can happen at anytime" (07/09/2013)  . COPD (chronic obstructive pulmonary disease)     pt denies this hx on 07/09/2013  . Type II diabetes mellitus   . H/O hiatal hernia 1997  . Arthritis     "qwhere" (07/09/2013)  . Gout   . Renal disorder     "only have 1 kidney; due to go back soon to check the other one" (07/09/2013)  . RUPTURE ROTATOR CUFF 12/03/2009    Qualifier: Diagnosis of  By: Aline Brochure MD, Dorothyann Peng      Past Surgical History  Procedure Laterality Date  . Nephrectomy Left 12/19/2001  . Femoral artery stent Right 07/09/2013  . Cholecystectomy  1980's  . Coronary artery bypass graft  ~ 2010    "CABG X5" (07/09/2013)  . Glaucoma surgery Bilateral ?[id number]'Y  . Eye surgery    . Carpal tunnel release Bilateral  ~2004; ~ 2011  . Trigger finger release Left ~ 1990's  . Cardiac catheterization  ~ 2010    "before the bypass" (07/09/2013)  . Below knee leg amputation Left 08/2009  . Above knee leg amputation Left 09/2009  . Lower extremity angiogram Right Nov. 4, 2014  . Lower extremity angiogram Right 07/09/2013    Procedure: LOWER EXTREMITY ANGIOGRAM;  Surgeon: Serafina Mitchell, MD;  Location[facility] C[facility] LAB;  Service: Cardiovascular;  Laterality: Right;    History   Social History  . Marital Status: Married    Spouse Name: N/A  . Number of Children: N/A  . Years of Education: N/A   Occupational History  . Not on file.   Social History Main Topics  . Smoking status: Current Every Day Smoker -- 1.00 packs/day for 54 years    Types: Cigarettes  . Smokeless tobacco: Never Used  . Alcohol Use: 0.0 oz/week     Comment: social drinker  . Drug Use: No  . Sexual Activity: No   Other Topics Concern  . Not on file   Social History Narrative    Family History  Problem Relation Age of Onset  . Cancer Mother   . Heart  attack Father     Allergies as of 12/31/2014 - Review Complete 12/31/2014  Allergen Reaction Noted  . Iohexol  07/15/2007  . Penicillins Itching and Swelling   . Erythromycin Swelling and Rash     Current Outpatient Prescriptions on File Prior to Visit  Medication Sig Dispense Refill  . albuterol (PROAIR HFA) 108 (90 BASE) MCG/ACT inhaler Inhale 2 puffs into the lungs every 12 (twelve) hours. And then every 4 hours as needed.    . Amino Acids-Protein Hydrolys (FEEDING SUPPLEMENT, PRO-STAT SUGAR FREE 64,) LIQD Take 30 mLs by mouth 2 (two) times daily.    Marland Kitchen aspirin EC 81 MG EC tablet Take 1 tablet (81 mg total) by mouth daily.    . cholecalciferol (VITAMIN D) 1000 UNITS tablet Take 2,000 Units by mouth daily.    . ciprofloxacin (CIPRO) 500 MG tablet Take 1 tablet (500 mg total) by mouth 2 (two) times daily. For 5 more days    . citalopram (CELEXA) 20 MG tablet Take 20 mg by  mouth daily.     . diazepam (VALIUM) 5 MG tablet Take 2 tablets (10 mg total) by mouth every 6 (six) hours as needed for anxiety. 30 tablet 0  . diclofenac sodium (VOLTAREN) 1 % GEL Apply 2 g topically 3 (three) times daily. To left shoulder    . diphenhydrAMINE (BENADRYL) 50 MG capsule Take one tablet at 7:30 AM 07/09/13 (take with last dose of Prednisone 50 mg) prior to procedure. 1 capsule 0  . famotidine (PEPCID) 20 MG tablet Take 1 tablet (20 mg total) by mouth daily.    . feeding supplement, ENSURE COMPLETE, (ENSURE COMPLETE) LIQD Take 237 mLs by mouth 2 (two) times daily between meals.    . folic acid (FOLVITE) 1 MG tablet Take 1 tablet (1 mg total) by mouth daily.    Marland Kitchen gabapentin (NEURONTIN) 100 MG capsule Take 200 mg by mouth 3 (three) times daily.     Marland Kitchen HYDROcodone-acetaminophen (NORCO) 7.5-325 MG per tablet Take 1 tablet by mouth every 4 (four) hours as needed. 30 tablet 0  . metFORMIN (GLUCOPHAGE) 500 MG tablet Take 500 mg by mouth 2 (two) times daily with a meal.    . metoprolol (LOPRESSOR) 50 MG tablet Take 50 mg by mouth 2 (two) times daily.    . Multiple Vitamins-Iron (MULTIVITAMINS WITH IRON) TABS tablet Take 1 tablet by mouth daily.    . mupirocin ointment (BACTROBAN) 2 % Place 1 application into the nose 3 (three) times daily as needed.    . nitroGLYCERIN (NITROLINGUAL) 0.4 MG/SPRAY spray Place 1 spray under the tongue every 5 (five) minutes as needed for chest pain.    Marland Kitchen OLANZapine (ZYPREXA) 2.5 MG tablet Take 1 tablet (2.5 mg total) by mouth at bedtime. 30 tablet 0  . potassium chloride SA (K-DUR,KLOR-CON) 10 MEQ tablet Take 1 tablet (10 mEq total) by mouth daily. For 2 weeks and then recheck serum potassium.    . ramipril (ALTACE) 5 MG capsule Take 1 capsule (5 mg total) by mouth every morning.    . senna-docusate (SENOKOT-S) 8.6-50 MG per tablet Take 1 tablet by mouth at bedtime. (Patient taking differently: Take 1 tablet by mouth 2 (two) times daily. )    . Tamsulosin HCl  (FLOMAX) 0.4 MG CAPS Take 0.4 mg by mouth 2 (two) times daily.     Marland Kitchen thiamine 100 MG tablet Take 1 tablet (100 mg total) by mouth daily.    . traZODone (DESYREL) 50 MG tablet  Take 1 tablet (50 mg total) by mouth at bedtime. 30 tablet 0  . tuberculin (APLISOL) 5 UNIT/0.1ML injection Inject 5 Units into the skin once.     No current facility-administered medications on file prior to visit.     REVIEW OF SYSTEMS: Please see history of present illness, otherwise negative  PHYSICAL EXAMINATION:   Vital signs are  Filed Vitals:   12/31/14 1020  BP: 124/79  Pulse: 80  Height: 6\' 3"  (1.905 m)  Weight: 200 lb (90.719 kg)  SpO2: 100%   Body mass index is 25 kg/(m^2). General: The patient appears their stated age. HEENT:  No gross abnormalities Pulmonary:  Non labored breathing Abdomen: Soft and non-tender Musculoskeletal: Well-healed left AKA stump Neurologic: No focal weakness or paresthesias are detected, Skin: There are no ulcer or rashes noted. Psychiatric: The patient has normal affect. Cardiovascular: There is a regular rate and rhythm without significant murmur appreciated.  Nonpalpable pedal pulses.   Diagnostic Studies ABIs and duplex have been reviewed.  ABI 0.92 on the right.  No significant stenosis within his stent was identified  Assessment: New Right toe ulcer Plan: I discussed with the patient that although his ultrasound is relatively benign, I think that we should proceed with angiography to confirm that his stent and lower sternum a arteries are wide open and no intervention is needed.  I want to make sure his blood flow is optimized given that he has new onset development of ulcers.  This is been scheduled for Tuesday, May 3  V. Leia Alf, M.D. [facility] [place] [facility] Office: [phone] Pager:  [phone]

## 2015-01-01 ENCOUNTER — Telehealth: Payer: Self-pay

## 2015-01-01 NOTE — Telephone Encounter (Signed)
[name] is calling to cancel procedure with Dr. Trula Slade for next Tuesday, 01/06/15. Patient states his wife is having surgery on 01/08/15 and needs to be available for her. Patient will call the office to reschedule.

## 2015-01-21 DIAGNOSIS — L8990 Pressure ulcer of unspecified site, unspecified stage: Secondary | ICD-10-CM | POA: Diagnosis not present

## 2015-01-28 ENCOUNTER — Other Ambulatory Visit: Payer: Self-pay | Admitting: *Deleted

## 2015-01-28 ENCOUNTER — Other Ambulatory Visit: Payer: Self-pay

## 2015-01-28 DIAGNOSIS — M792 Neuralgia and neuritis, unspecified: Secondary | ICD-10-CM

## 2015-01-28 MED ORDER — GABAPENTIN 100 MG PO CAPS: 300.0000 mg | ORAL_CAPSULE | Freq: Two times a day (BID) | ORAL | Status: DC

## 2015-02-02 MED ORDER — SODIUM CHLORIDE 0.9 % IJ SOLN: 3.0000 mL | INTRAMUSCULAR | Status: DC | PRN

## 2015-02-02 MED ORDER — SODIUM CHLORIDE 0.9 % IV SOLN: 250.0000 mL | INTRAVENOUS | Status: DC | PRN

## 2015-02-02 MED ORDER — SODIUM CHLORIDE 0.9 % IJ SOLN: 3.0000 mL | Freq: Two times a day (BID) | INTRAMUSCULAR | Status: DC

## 2015-02-03 ENCOUNTER — Ambulatory Visit (HOSPITAL_COMMUNITY)
Admission: RE | Admit: 2015-02-03 | Discharge: 2015-02-03 | Disposition: A | Discharge status: Home / Self Care | Payer: Medicare Other | Source: Ambulatory Visit | Attending: Surgery | Admitting: Surgery

## 2015-02-03 ENCOUNTER — Encounter (HOSPITAL_COMMUNITY): Payer: Self-pay | Admitting: Surgery

## 2015-02-03 ENCOUNTER — Encounter (HOSPITAL_COMMUNITY)
Admission: RE | Disposition: A | Discharge status: Home / Self Care | Payer: Medicare Other | Source: Ambulatory Visit | Attending: Surgery

## 2015-02-03 DIAGNOSIS — M1990 Unspecified osteoarthritis, unspecified site: Secondary | ICD-10-CM | POA: Diagnosis not present

## 2015-02-03 DIAGNOSIS — F17210 Nicotine dependence, cigarettes, uncomplicated: Secondary | ICD-10-CM | POA: Diagnosis not present

## 2015-02-03 DIAGNOSIS — Z89612 Acquired absence of left leg above knee: Secondary | ICD-10-CM | POA: Insufficient documentation

## 2015-02-03 DIAGNOSIS — J449 Chronic obstructive pulmonary disease, unspecified: Secondary | ICD-10-CM | POA: Insufficient documentation

## 2015-02-03 DIAGNOSIS — I70235 Atherosclerosis of native arteries of right leg with ulceration of other part of foot: Secondary | ICD-10-CM | POA: Diagnosis not present

## 2015-02-03 DIAGNOSIS — Z791 Long term (current) use of non-steroidal anti-inflammatories (NSAID): Secondary | ICD-10-CM | POA: Insufficient documentation

## 2015-02-03 DIAGNOSIS — Z79899 Other long term (current) drug therapy: Secondary | ICD-10-CM | POA: Insufficient documentation

## 2015-02-03 DIAGNOSIS — G629 Polyneuropathy, unspecified: Secondary | ICD-10-CM | POA: Diagnosis not present

## 2015-02-03 DIAGNOSIS — M109 Gout, unspecified: Secondary | ICD-10-CM | POA: Insufficient documentation

## 2015-02-03 DIAGNOSIS — I252 Old myocardial infarction: Secondary | ICD-10-CM | POA: Diagnosis not present

## 2015-02-03 DIAGNOSIS — Z8554 Personal history of malignant neoplasm of ureter: Secondary | ICD-10-CM | POA: Diagnosis not present

## 2015-02-03 DIAGNOSIS — Z79891 Long term (current) use of opiate analgesic: Secondary | ICD-10-CM | POA: Diagnosis not present

## 2015-02-03 DIAGNOSIS — E119 Type 2 diabetes mellitus without complications: Secondary | ICD-10-CM | POA: Insufficient documentation

## 2015-02-03 DIAGNOSIS — I2510 Atherosclerotic heart disease of native coronary artery without angina pectoris: Secondary | ICD-10-CM | POA: Diagnosis not present

## 2015-02-03 DIAGNOSIS — Z905 Acquired absence of kidney: Secondary | ICD-10-CM | POA: Insufficient documentation

## 2015-02-03 DIAGNOSIS — Z951 Presence of aortocoronary bypass graft: Secondary | ICD-10-CM | POA: Diagnosis not present

## 2015-02-03 DIAGNOSIS — I700 Atherosclerosis of aorta: Secondary | ICD-10-CM | POA: Insufficient documentation

## 2015-02-03 DIAGNOSIS — Z792 Long term (current) use of antibiotics: Secondary | ICD-10-CM | POA: Diagnosis not present

## 2015-02-03 DIAGNOSIS — I10 Essential (primary) hypertension: Secondary | ICD-10-CM | POA: Diagnosis not present

## 2015-02-03 DIAGNOSIS — Z7982 Long term (current) use of aspirin: Secondary | ICD-10-CM | POA: Diagnosis not present

## 2015-02-03 DIAGNOSIS — L97519 Non-pressure chronic ulcer of other part of right foot with unspecified severity: Secondary | ICD-10-CM | POA: Insufficient documentation

## 2015-02-03 DIAGNOSIS — M79604 Pain in right leg: Secondary | ICD-10-CM | POA: Diagnosis present

## 2015-02-03 DIAGNOSIS — Z89512 Acquired absence of left leg below knee: Secondary | ICD-10-CM | POA: Insufficient documentation

## 2015-02-03 DIAGNOSIS — J45909 Unspecified asthma, uncomplicated: Secondary | ICD-10-CM | POA: Insufficient documentation

## 2015-02-03 DIAGNOSIS — I70239 Atherosclerosis of native arteries of right leg with ulceration of unspecified site: Secondary | ICD-10-CM | POA: Diagnosis not present

## 2015-02-03 HISTORY — PX: PERIPHERAL VASCULAR CATHETERIZATION: SHX172C

## 2015-02-03 LAB — GLUCOSE, CAPILLARY
GLUCOSE-CAPILLARY: 115 mg/dL — AB (ref 65–99)
Glucose-Capillary: 102 mg/dL — ABNORMAL HIGH (ref 65–99)

## 2015-02-03 SURGERY — ABDOMINAL AORTOGRAM

## 2015-02-03 MED ORDER — MIDAZOLAM HCL 2 MG/2ML IJ SOLN
INTRAMUSCULAR | Status: AC
  Filled 2015-02-03: qty 2

## 2015-02-03 MED ORDER — ONDANSETRON HCL 4 MG/2ML IJ SOLN: 4.0000 mg | Freq: Four times a day (QID) | INTRAMUSCULAR | Status: DC | PRN

## 2015-02-03 MED ORDER — METHYLPREDNISOLONE SODIUM SUCC 125 MG IJ SOLR
125.0000 mg | INTRAMUSCULAR | Status: AC
  Administered 2015-02-03: 125 mg via INTRAVENOUS

## 2015-02-03 MED ORDER — MORPHINE SULFATE 2 MG/ML IJ SOLN
INTRAMUSCULAR | Status: AC
  Administered 2015-02-03: 2 mg via INTRAVENOUS
  Filled 2015-02-03: qty 1

## 2015-02-03 MED ORDER — ALUM & MAG HYDROXIDE-SIMETH 200-200-20 MG/5ML PO SUSP
15.0000 mL | ORAL | Status: DC | PRN
  Filled 2015-02-03: qty 30

## 2015-02-03 MED ORDER — PHENOL 1.4 % MT LIQD: 1.0000 | OROMUCOSAL | Status: DC | PRN

## 2015-02-03 MED ORDER — OXYCODONE HCL 5 MG PO TABS
5.0000 mg | ORAL_TABLET | ORAL | Status: DC | PRN
  Administered 2015-02-03: 5 mg via ORAL
  Filled 2015-02-03: qty 2

## 2015-02-03 MED ORDER — DIPHENHYDRAMINE HCL 50 MG/ML IJ SOLN
INTRAMUSCULAR | Status: AC
  Administered 2015-02-03: 25 mg via INTRAVENOUS
  Filled 2015-02-03: qty 1

## 2015-02-03 MED ORDER — DOCUSATE SODIUM 100 MG PO CAPS: 100.0000 mg | ORAL_CAPSULE | Freq: Every day | ORAL | Status: DC

## 2015-02-03 MED ORDER — FENTANYL CITRATE (PF) 100 MCG/2ML IJ SOLN
INTRAMUSCULAR | Status: DC | PRN
  Administered 2015-02-03: 25 ug via INTRAVENOUS
  Administered 2015-02-03: 50 ug via INTRAVENOUS

## 2015-02-03 MED ORDER — HEPARIN (PORCINE) IN NACL 2-0.9 UNIT/ML-% IJ SOLN
INTRAMUSCULAR | Status: AC
  Filled 2015-02-03: qty 500

## 2015-02-03 MED ORDER — FENTANYL CITRATE (PF) 100 MCG/2ML IJ SOLN
INTRAMUSCULAR | Status: AC
  Filled 2015-02-03: qty 2

## 2015-02-03 MED ORDER — MIDAZOLAM HCL 2 MG/2ML IJ SOLN
INTRAMUSCULAR | Status: DC | PRN
  Administered 2015-02-03: 2 mg via INTRAVENOUS

## 2015-02-03 MED ORDER — DIPHENHYDRAMINE HCL 50 MG/ML IJ SOLN
25.0000 mg | INTRAMUSCULAR | Status: AC
  Administered 2015-02-03: 25 mg via INTRAVENOUS

## 2015-02-03 MED ORDER — METOPROLOL TARTRATE 1 MG/ML IV SOLN: 2.0000 mg | INTRAVENOUS | Status: DC | PRN

## 2015-02-03 MED ORDER — FAMOTIDINE IN NACL 20-0.9 MG/50ML-% IV SOLN
20.0000 mg | INTRAVENOUS | Status: AC
  Administered 2015-02-03: 20 mg via INTRAVENOUS

## 2015-02-03 MED ORDER — MORPHINE SULFATE 10 MG/ML IJ SOLN: 2.0000 mg | Freq: Once | INTRAMUSCULAR | Status: DC

## 2015-02-03 MED ORDER — MORPHINE SULFATE 10 MG/ML IJ SOLN: 2.0000 mg | INTRAMUSCULAR | Status: DC | PRN

## 2015-02-03 MED ORDER — LIDOCAINE HCL (PF) 1 % IJ SOLN
INTRAMUSCULAR | Status: AC
  Filled 2015-02-03: qty 30

## 2015-02-03 MED ORDER — OXYCODONE HCL 5 MG PO TABS
ORAL_TABLET | ORAL | Status: AC
  Administered 2015-02-03: 5 mg via ORAL
  Filled 2015-02-03: qty 1

## 2015-02-03 MED ORDER — LABETALOL HCL 5 MG/ML IV SOLN: 10.0000 mg | INTRAVENOUS | Status: DC | PRN

## 2015-02-03 MED ORDER — ACETAMINOPHEN 325 MG RE SUPP
325.0000 mg | RECTAL | Status: DC | PRN
  Filled 2015-02-03: qty 2

## 2015-02-03 MED ORDER — FAMOTIDINE IN NACL 20-0.9 MG/50ML-% IV SOLN
INTRAVENOUS | Status: AC
Start: 2015-02-03 — End: 2015-02-03
  Administered 2015-02-03: 20 mg via INTRAVENOUS
  Filled 2015-02-03: qty 50

## 2015-02-03 MED ORDER — GUAIFENESIN-DM 100-10 MG/5ML PO SYRP
15.0000 mL | ORAL_SOLUTION | ORAL | Status: DC | PRN
Start: 2015-02-03 — End: 2015-02-03
  Filled 2015-02-03: qty 15

## 2015-02-03 MED ORDER — METHYLPREDNISOLONE SODIUM SUCC 125 MG IJ SOLR
INTRAMUSCULAR | Status: AC
  Administered 2015-02-03: 125 mg via INTRAVENOUS
  Filled 2015-02-03: qty 2

## 2015-02-03 MED ORDER — ACETAMINOPHEN 325 MG PO TABS
325.0000 mg | ORAL_TABLET | ORAL | Status: DC | PRN
  Filled 2015-02-03: qty 2

## 2015-02-03 MED ORDER — SODIUM CHLORIDE 0.9 % IV SOLN: 1.0000 mL/kg/h | INTRAVENOUS | Status: DC

## 2015-02-03 MED ORDER — HYDRALAZINE HCL 20 MG/ML IJ SOLN: 5.0000 mg | INTRAMUSCULAR | Status: DC | PRN

## 2015-02-03 SURGICAL SUPPLY — 13 items
CATH OMNI FLUSH 5F 65CM (CATHETERS) ×2 IMPLANT
COVER PRB 48X5XTLSCP FOLD TPE (BAG) IMPLANT
COVER PROBE 5X48 (BAG) ×3
DRAPE ZERO GRAVITY STERILE (DRAPES) ×2 IMPLANT
KIT MICROINTRODUCER 5F 7206 (SHEATH) ×2 IMPLANT
KIT PV (KITS) ×3 IMPLANT
PAD ELECT DEFIB RADIOL ZOLL (MISCELLANEOUS) ×2 IMPLANT
SHEATH PINNACLE 5F 10CM (SHEATH) ×2 IMPLANT
SHIELD RADPAD SCOOP 12X17 (MISCELLANEOUS) ×2 IMPLANT
SYR MEDRAD MARK V 150ML (SYRINGE) IMPLANT
TRANSDUCER W/STOPCOCK (MISCELLANEOUS) ×3 IMPLANT
TRAY PV CATH (CUSTOM PROCEDURE TRAY) ×3 IMPLANT
WIRE BENTSON .035X145CM (WIRE) ×2 IMPLANT

## 2015-02-03 NOTE — H&P (Signed)
Expand All Collapse All       Patient name: [name] Kolander CuozziMRN: [id number] DOB: 30-Aug-1942Sex: male    Chief Complaint  Patient presents with  . Re-evaluation    6 month f/u     HISTORY OF PRESENT ILLNESS: The patient is back today for followup of his right leg pain. He underwent stenting of his right superficial femoral artery on 07/09/2013. He has a chronic contracture of his right he has a left above-knee amputation but has not been able to use his prosthesis. He also complains swelling in his right leg as well as feeling very cold.knee. He recently developed a small source on the toes of his right foot which are very painful.  The patient continues to smoke. He remains a diabetic. He takes Neurontin for his neuropathic pain. He has a history of coronary artery disease.  Past Medical History  Diagnosis Date  . Asthma   . Hypertension   . Ureter cancer 12/2001    "shut down my kidney & resulted in nephrectomy" (07/09/2013)  . Coronary artery disease   . High cholesterol   . Heart murmur     "had rheumatic fever as a kid" (07/09/2013)  . Rheumatic fever   . Myocardial infarction 03/2002; ~ 2011  . Anginal pain   . Pneumonia     "once" (07/09/2013)  . Shortness of breath     "can happen at anytime" (07/09/2013)  . COPD (chronic obstructive pulmonary disease)     pt denies this hx on 07/09/2013  . Type II diabetes mellitus   . H/O hiatal hernia 1997  . Arthritis     "qwhere" (07/09/2013)  . Gout   . Renal disorder     "only have 1 kidney; due to go back soon to check the other one" (07/09/2013)  . RUPTURE ROTATOR CUFF 12/03/2009    Qualifier: Diagnosis of By: Aline Brochure MD, Dorothyann Peng     Past Surgical History  Procedure Laterality Date  . Nephrectomy Left 12/19/2001  . Femoral artery stent Right 07/09/2013  . Cholecystectomy  1980's   . Coronary artery bypass graft  ~ 2010    "CABG X5" (07/09/2013)  . Glaucoma surgery Bilateral ?[id number]'J  . Eye surgery    . Carpal tunnel release Bilateral ~2004; ~ 2011  . Trigger finger release Left ~ 1990's  . Cardiac catheterization  ~ 2010    "before the bypass" (07/09/2013)  . Below knee leg amputation Left 08/2009  . Above knee leg amputation Left 09/2009  . Lower extremity angiogram Right Nov. 4, 2014  . Lower extremity angiogram Right 07/09/2013    Procedure: LOWER EXTREMITY ANGIOGRAM; Surgeon: Serafina Mitchell, MD; Location: [facility] CATH LAB; Service: Cardiovascular; Laterality: Right;    History   Social History  . Marital Status: Married    Spouse Name: N/A  . Number of Children: N/A  . Years of Education: N/A   Occupational History  . Not on file.   Social History Main Topics  . Smoking status: Current Every Day Smoker -- 1.00 packs/day for 54 years    Types: Cigarettes  . Smokeless tobacco: Never Used  . Alcohol Use: 0.0 oz/week     Comment: social drinker  . Drug Use: No  . Sexual Activity: No   Other Topics Concern  . Not on file   Social History Narrative    Family History  Problem Relation Age of Onset  . Cancer Mother   . Heart attack Father  Allergies as of 12/31/2014 - Review Complete 12/31/2014  Allergen Reaction Noted  . Iohexol  07/15/2007  . Penicillins Itching and Swelling   . Erythromycin Swelling and Rash     Current Outpatient Prescriptions on File Prior to Visit  Medication Sig Dispense Refill  . albuterol (PROAIR HFA) 108 (90 BASE) MCG/ACT inhaler Inhale 2 puffs into the lungs every 12 (twelve) hours. And then every 4 hours as needed.    . Amino Acids-Protein Hydrolys (FEEDING SUPPLEMENT, PRO-STAT SUGAR FREE 64,) LIQD Take 30 mLs by mouth 2 (two) times daily.    Marland Kitchen  aspirin EC 81 MG EC tablet Take 1 tablet (81 mg total) by mouth daily.    . cholecalciferol (VITAMIN D) 1000 UNITS tablet Take 2,000 Units by mouth daily.    . ciprofloxacin (CIPRO) 500 MG tablet Take 1 tablet (500 mg total) by mouth 2 (two) times daily. For 5 more days    . citalopram (CELEXA) 20 MG tablet Take 20 mg by mouth daily.     . diazepam (VALIUM) 5 MG tablet Take 2 tablets (10 mg total) by mouth every 6 (six) hours as needed for anxiety. 30 tablet 0  . diclofenac sodium (VOLTAREN) 1 % GEL Apply 2 g topically 3 (three) times daily. To left shoulder    . diphenhydrAMINE (BENADRYL) 50 MG capsule Take one tablet at 7:30 AM 07/09/13 (take with last dose of Prednisone 50 mg) prior to procedure. 1 capsule 0  . famotidine (PEPCID) 20 MG tablet Take 1 tablet (20 mg total) by mouth daily.    . feeding supplement, ENSURE COMPLETE, (ENSURE COMPLETE) LIQD Take 237 mLs by mouth 2 (two) times daily between meals.    . folic acid (FOLVITE) 1 MG tablet Take 1 tablet (1 mg total) by mouth daily.    Marland Kitchen gabapentin (NEURONTIN) 100 MG capsule Take 200 mg by mouth 3 (three) times daily.     Marland Kitchen HYDROcodone-acetaminophen (NORCO) 7.5-325 MG per tablet Take 1 tablet by mouth every 4 (four) hours as needed. 30 tablet 0  . metFORMIN (GLUCOPHAGE) 500 MG tablet Take 500 mg by mouth 2 (two) times daily with a meal.    . metoprolol (LOPRESSOR) 50 MG tablet Take 50 mg by mouth 2 (two) times daily.    . Multiple Vitamins-Iron (MULTIVITAMINS WITH IRON) TABS tablet Take 1 tablet by mouth daily.    . mupirocin ointment (BACTROBAN) 2 % Place 1 application into the nose 3 (three) times daily as needed.    . nitroGLYCERIN (NITROLINGUAL) 0.4 MG/SPRAY spray Place 1 spray under the tongue every 5 (five) minutes as needed for chest pain.    Marland Kitchen OLANZapine (ZYPREXA) 2.5 MG tablet Take 1 tablet (2.5 mg total) by mouth at bedtime. 30 tablet 0  .  potassium chloride SA (K-DUR,KLOR-CON) 10 MEQ tablet Take 1 tablet (10 mEq total) by mouth daily. For 2 weeks and then recheck serum potassium.    . ramipril (ALTACE) 5 MG capsule Take 1 capsule (5 mg total) by mouth every morning.    . senna-docusate (SENOKOT-S) 8.6-50 MG per tablet Take 1 tablet by mouth at bedtime. (Patient taking differently: Take 1 tablet by mouth 2 (two) times daily. )    . Tamsulosin HCl (FLOMAX) 0.4 MG CAPS Take 0.4 mg by mouth 2 (two) times daily.     Marland Kitchen thiamine 100 MG tablet Take 1 tablet (100 mg total) by mouth daily.    . traZODone (DESYREL) 50 MG tablet Take 1 tablet (50 mg total)  by mouth at bedtime. 30 tablet 0  . tuberculin (APLISOL) 5 UNIT/0.1ML injection Inject 5 Units into the skin once.     No current facility-administered medications on file prior to visit.     REVIEW OF SYSTEMS: Please see history of present illness, otherwise negative  PHYSICAL EXAMINATION:  Vital signs are  Filed Vitals:   12/31/14 1020  BP: 124/79  Pulse: 80  Height: 6\' 3"  (1.905 m)  Weight: 200 lb (90.719 kg)  SpO2: 100%   Body mass index is 25 kg/(m^2). General: The patient appears their stated age. HEENT: No gross abnormalities Pulmonary: Non labored breathing Abdomen: Soft and non-tender Musculoskeletal: Well-healed left AKA stump Neurologic: No focal weakness or paresthesias are detected, Skin: There are no ulcer or rashes noted. Psychiatric: The patient has normal affect. Cardiovascular: There is a regular rate and rhythm without significant murmur appreciated. Nonpalpable pedal pulses.   Diagnostic Studies ABIs and duplex have been reviewed. ABI 0.92 on the right. No significant stenosis within his stent was identified  Assessment: New Right toe ulcer Plan: I discussed with the patient that although his ultrasound is relatively benign, I think that we should proceed with angiography to confirm that his  stent and lower sternum a arteries are wide open and no intervention is needed. I want to make sure his blood flow is optimized given that he has new onset development of ulcers. This is been scheduled for Tuesday, May 3  V. Leia Alf, M.D. [facility] [place] Office: [phone] Pager: [phone]        No interval change

## 2015-02-03 NOTE — Op Note (Addendum)
    Patient name: [name] MRN: [id number] DOB: [birth year] Sex: male  02/03/2015 Pre-operative Diagnosis: Right leg ulcer Post-operative diagnosis:  Same Surgeon:  Annamarie Major Procedure Performed:  1.  Ultrasound-guided access, left femoral artery  2.  Abdominal aortogram  3.  Right lower extremity runoff  4.  Second order catheterization   Indications:  The patient has a history of a right superficial femoral artery stent.  He presented to the office with a nonhealing wound.  Ultrasound did not show significant stenosis but because of the wound I felt he needed angiography.  Procedure:  The patient was identified in the holding area and taken to room 8.  The patient was then placed supine on the table and prepped and draped in the usual sterile fashion.  A time out was called.  Ultrasound was used to evaluate the left common femoral artery.  It was patent .  A digital ultrasound image was acquired.  A micropuncture needle was used to access the left common femoral artery under ultrasound guidance.  An 018 wire was advanced without resistance and a micropuncture sheath was placed.  The 018 wire was removed and a benson wire was placed.  The micropuncture sheath was exchanged for a 5 french sheath.  An omniflush catheter was advanced over the wire to the level of L-1.  An abdominal angiogram was obtained.  Next, using the omniflush catheter and a benson wire, the aortic bifurcation was crossed and the catheter was placed into theright external iliac artery and right runoff was obtained.    Findings:   Aortogram:  The suprarenal abdominal aorta is widely patent.  The right renal artery is widely patent the left renal artery is surgically absent.  The infrarenal abdominal aorta is widely patent but calcified.  Bilateral common and external iliac arteries are widely patent  Right Lower Extremity:  The right common femoral artery is calcified but patent.  The profunda femoral artery is widely  patent.  The superficial femoral artery has a stent within his proximal portion with no significant in-stent stenosis.  There is mild diffuse stenosis throughout the superficial femoral and popliteal artery with no stenosis greater than 30%.  There is two-vessel runoff via the anterior tibial and posterior tibial artery  Left Lower Extremity:  Not evaluated  Intervention:  None  Impression:  #1  no significant aortoiliac occlusion or stenosis  #2  right lower extremity reveals a widely patent stent and no significant change from previous imaging.  There is diffuse disease throughout the femoral-popliteal segment with no stenosis being greater than 30%    V. Annamarie Major, M.D. [facility] [place] Office: [phone] Pager:  [phone]

## 2015-02-03 NOTE — Discharge Instructions (Signed)

## 2015-02-04 MED FILL — Heparin Sodium (Porcine) 2 Unit/ML in Sodium Chloride 0.9%: INTRAMUSCULAR | Qty: 1000 | Status: AC

## 2015-02-04 MED FILL — Lidocaine HCl Local Preservative Free (PF) Inj 1%: INTRAMUSCULAR | Qty: 30 | Status: AC

## 2015-02-09 DIAGNOSIS — G4700 Insomnia, unspecified: Secondary | ICD-10-CM | POA: Diagnosis not present

## 2015-02-09 DIAGNOSIS — R195 Other fecal abnormalities: Secondary | ICD-10-CM | POA: Diagnosis not present

## 2015-02-09 DIAGNOSIS — R634 Abnormal weight loss: Secondary | ICD-10-CM | POA: Diagnosis not present

## 2015-02-09 DIAGNOSIS — Z681 Body mass index (BMI) 19 or less, adult: Secondary | ICD-10-CM | POA: Diagnosis not present

## 2015-02-12 ENCOUNTER — Telehealth: Payer: Self-pay

## 2015-02-12 DIAGNOSIS — E1140 Type 2 diabetes mellitus with diabetic neuropathy, unspecified: Secondary | ICD-10-CM

## 2015-02-12 NOTE — Telephone Encounter (Signed)
Dr. Trula Slade advised to increase Neurontin to 600 mg BID.  Pt. was notified and questioned if the dose was safe if he only has one kidney.  Dr. Trula Slade will be contacted with this question.

## 2015-02-12 NOTE — Telephone Encounter (Signed)
Phone call from pt.  Reported he continues to have pain in left leg, from stump up to hip, and also pain in his right leg.  Stated Dr. Trula Slade told him he could increase his Neurontin to a maximum dose, if the 300 mg BID didn't help.  Is requesting to have higher dose of Neurontin prescribed.  Advised will discuss with Dr. Trula Slade and notify pt. of recommendation.

## 2015-02-13 MED ORDER — GABAPENTIN 300 MG PO CAPS: ORAL_CAPSULE | ORAL | Status: DC

## 2015-02-13 NOTE — Telephone Encounter (Signed)
Dr. Trula Slade recommended to verify the safety of the Neurontin 600 mg BID with pt's. Pharmacist.  Melvia Heaps, RPh., @ [facility].  In reviewing pt's weight, recent Serum Creatinine and history of having a nephrectomy, stated it would be a safe dose.  Pt. notified of plan to start above dose.  Verb. understanding.

## 2015-02-18 ENCOUNTER — Encounter: Payer: Self-pay | Admitting: Gastroenterology

## 2015-04-13 DIAGNOSIS — T1490 Injury, unspecified: Secondary | ICD-10-CM | POA: Diagnosis not present

## 2015-04-28 ENCOUNTER — Ambulatory Visit: Payer: Medicare Other | Admitting: Gastroenterology

## 2015-06-02 ENCOUNTER — Telehealth: Payer: Self-pay

## 2015-06-02 DIAGNOSIS — E1140 Type 2 diabetes mellitus with diabetic neuropathy, unspecified: Secondary | ICD-10-CM

## 2015-06-02 NOTE — Telephone Encounter (Signed)
Phone call from pt.  Is requesting an increase in the Neurontin.  Reported that Dr. Trula Slade told him to call if the Neurontin 600 mg BID, wasn't working.  Reported increased pain in the left AKA stump, and in the right foot.  Stated the right foot is "always a little dark, and feels cold internally, even if it isn't cold externally."  Denied any open sores on right leg or left AKA stump site.  Stated he spends most of time in a wheelchair, and isn't able to use a prosthesis, since he needs a (R) knee replacement.  Advised will discuss pt's request with Dr. Trula Slade.

## 2015-06-03 MED ORDER — GABAPENTIN 300 MG PO CAPS: ORAL_CAPSULE | ORAL | Status: DC

## 2015-06-03 NOTE — Telephone Encounter (Signed)
[name] Mitchell, MD  [name] George, RN           Ok to increase. Add 300mg  . 600 bid plus an additional 300 at night    Received new order for Neurontin.  Call placed to pt.; spoke with wife.  Advised of recommendation to take Neurontin 300 mg, 2 caps in AM, and 300 mg, 3 caps in PM. Wife verbalized understanding.

## 2015-06-11 ENCOUNTER — Ambulatory Visit (HOSPITAL_COMMUNITY)
Admission: RE | Admit: 2015-06-11 | Discharge: 2015-06-11 | Disposition: A | Discharge status: Home / Self Care | Payer: Medicare Other | Source: Ambulatory Visit | Attending: Physician Assistant | Admitting: Physician Assistant

## 2015-06-11 ENCOUNTER — Other Ambulatory Visit (HOSPITAL_COMMUNITY): Payer: Self-pay | Admitting: Physician Assistant

## 2015-06-11 DIAGNOSIS — R5383 Other fatigue: Secondary | ICD-10-CM | POA: Diagnosis not present

## 2015-06-11 DIAGNOSIS — J449 Chronic obstructive pulmonary disease, unspecified: Secondary | ICD-10-CM | POA: Insufficient documentation

## 2015-06-11 DIAGNOSIS — Z1389 Encounter for screening for other disorder: Secondary | ICD-10-CM | POA: Diagnosis not present

## 2015-06-11 DIAGNOSIS — R059 Cough, unspecified: Secondary | ICD-10-CM

## 2015-06-11 DIAGNOSIS — M6281 Muscle weakness (generalized): Secondary | ICD-10-CM | POA: Diagnosis not present

## 2015-06-11 DIAGNOSIS — R05 Cough: Secondary | ICD-10-CM | POA: Diagnosis present

## 2015-06-11 DIAGNOSIS — E119 Type 2 diabetes mellitus without complications: Secondary | ICD-10-CM | POA: Diagnosis not present

## 2015-06-11 DIAGNOSIS — I10 Essential (primary) hypertension: Secondary | ICD-10-CM | POA: Diagnosis not present

## 2015-06-11 DIAGNOSIS — R8290 Unspecified abnormal findings in urine: Secondary | ICD-10-CM | POA: Diagnosis not present

## 2015-06-11 DIAGNOSIS — Z87891 Personal history of nicotine dependence: Secondary | ICD-10-CM | POA: Insufficient documentation

## 2015-06-11 DIAGNOSIS — Z681 Body mass index (BMI) 19 or less, adult: Secondary | ICD-10-CM | POA: Diagnosis not present

## 2015-07-01 DIAGNOSIS — E1165 Type 2 diabetes mellitus with hyperglycemia: Secondary | ICD-10-CM | POA: Diagnosis not present

## 2015-07-01 DIAGNOSIS — J449 Chronic obstructive pulmonary disease, unspecified: Secondary | ICD-10-CM | POA: Diagnosis not present

## 2015-07-01 DIAGNOSIS — Z1389 Encounter for screening for other disorder: Secondary | ICD-10-CM | POA: Diagnosis not present

## 2015-07-01 DIAGNOSIS — I10 Essential (primary) hypertension: Secondary | ICD-10-CM | POA: Diagnosis not present

## 2015-07-01 DIAGNOSIS — Z681 Body mass index (BMI) 19 or less, adult: Secondary | ICD-10-CM | POA: Diagnosis not present

## 2015-07-01 DIAGNOSIS — Z23 Encounter for immunization: Secondary | ICD-10-CM | POA: Diagnosis not present

## 2015-07-06 ENCOUNTER — Telehealth (HOSPITAL_COMMUNITY): Payer: Self-pay

## 2015-07-06 NOTE — Telephone Encounter (Signed)
Patient is going to see his md before coming to rehab.

## 2015-07-07 ENCOUNTER — Ambulatory Visit (HOSPITAL_COMMUNITY): Payer: Medicare Other | Admitting: Physical Therapy

## 2015-07-10 ENCOUNTER — Ambulatory Visit (HOSPITAL_COMMUNITY): Payer: Medicare Other | Admitting: Physical Therapy

## 2015-07-15 DIAGNOSIS — Z1389 Encounter for screening for other disorder: Secondary | ICD-10-CM | POA: Diagnosis not present

## 2015-07-15 DIAGNOSIS — E119 Type 2 diabetes mellitus without complications: Secondary | ICD-10-CM | POA: Diagnosis not present

## 2015-07-15 DIAGNOSIS — Z681 Body mass index (BMI) 19 or less, adult: Secondary | ICD-10-CM | POA: Diagnosis not present

## 2015-07-29 ENCOUNTER — Telehealth (HOSPITAL_COMMUNITY): Payer: Self-pay | Admitting: Physical Therapy

## 2015-07-29 NOTE — Telephone Encounter (Signed)
Wife said his sugar is still high and that they will see the MD on Dec 9, after that they will ask for a new referral. NF 07/29/15

## 2015-08-03 ENCOUNTER — Ambulatory Visit (HOSPITAL_COMMUNITY): Payer: Medicare Other | Admitting: Physical Therapy

## 2015-08-05 DIAGNOSIS — R1032 Left lower quadrant pain: Secondary | ICD-10-CM | POA: Diagnosis not present

## 2015-08-05 DIAGNOSIS — J449 Chronic obstructive pulmonary disease, unspecified: Secondary | ICD-10-CM | POA: Diagnosis not present

## 2015-08-05 DIAGNOSIS — Z1389 Encounter for screening for other disorder: Secondary | ICD-10-CM | POA: Diagnosis not present

## 2015-08-05 DIAGNOSIS — G894 Chronic pain syndrome: Secondary | ICD-10-CM | POA: Diagnosis not present

## 2015-08-05 DIAGNOSIS — E875 Hyperkalemia: Secondary | ICD-10-CM | POA: Diagnosis not present

## 2015-08-05 DIAGNOSIS — Z681 Body mass index (BMI) 19 or less, adult: Secondary | ICD-10-CM | POA: Diagnosis not present

## 2015-08-18 ENCOUNTER — Ambulatory Visit (HOSPITAL_COMMUNITY): Payer: Medicare Other | Admitting: Physical Therapy

## 2015-08-18 ENCOUNTER — Other Ambulatory Visit (HOSPITAL_COMMUNITY): Payer: Self-pay | Admitting: Physician Assistant

## 2015-08-18 DIAGNOSIS — R1032 Left lower quadrant pain: Secondary | ICD-10-CM

## 2015-08-20 ENCOUNTER — Ambulatory Visit (HOSPITAL_COMMUNITY): Payer: Medicare Other

## 2015-08-21 DIAGNOSIS — R262 Difficulty in walking, not elsewhere classified: Secondary | ICD-10-CM | POA: Diagnosis not present

## 2015-08-21 DIAGNOSIS — M6281 Muscle weakness (generalized): Secondary | ICD-10-CM | POA: Diagnosis not present

## 2015-08-21 DIAGNOSIS — M25561 Pain in right knee: Secondary | ICD-10-CM | POA: Diagnosis not present

## 2015-08-21 DIAGNOSIS — M25661 Stiffness of right knee, not elsewhere classified: Secondary | ICD-10-CM | POA: Diagnosis not present

## 2015-10-01 DIAGNOSIS — Z681 Body mass index (BMI) 19 or less, adult: Secondary | ICD-10-CM | POA: Diagnosis not present

## 2015-10-01 DIAGNOSIS — Z719 Counseling, unspecified: Secondary | ICD-10-CM | POA: Diagnosis not present

## 2015-10-01 DIAGNOSIS — J449 Chronic obstructive pulmonary disease, unspecified: Secondary | ICD-10-CM | POA: Diagnosis not present

## 2015-10-01 DIAGNOSIS — Z1389 Encounter for screening for other disorder: Secondary | ICD-10-CM | POA: Diagnosis not present

## 2015-10-01 DIAGNOSIS — R5383 Other fatigue: Secondary | ICD-10-CM | POA: Diagnosis not present

## 2015-10-01 DIAGNOSIS — E119 Type 2 diabetes mellitus without complications: Secondary | ICD-10-CM | POA: Diagnosis not present

## 2015-10-02 ENCOUNTER — Other Ambulatory Visit (HOSPITAL_COMMUNITY): Payer: Self-pay | Admitting: Physician Assistant

## 2015-10-02 ENCOUNTER — Ambulatory Visit (HOSPITAL_COMMUNITY)
Admission: RE | Admit: 2015-10-02 | Discharge: 2015-10-02 | Disposition: A | Discharge status: Home / Self Care | Payer: Medicare Other | Source: Ambulatory Visit | Attending: Physician Assistant | Admitting: Physician Assistant

## 2015-10-02 DIAGNOSIS — R5383 Other fatigue: Secondary | ICD-10-CM | POA: Diagnosis not present

## 2015-10-02 DIAGNOSIS — R05 Cough: Secondary | ICD-10-CM | POA: Insufficient documentation

## 2015-10-02 DIAGNOSIS — J449 Chronic obstructive pulmonary disease, unspecified: Secondary | ICD-10-CM | POA: Diagnosis not present

## 2015-10-05 DIAGNOSIS — J449 Chronic obstructive pulmonary disease, unspecified: Secondary | ICD-10-CM | POA: Diagnosis not present

## 2015-10-05 DIAGNOSIS — N189 Chronic kidney disease, unspecified: Secondary | ICD-10-CM | POA: Diagnosis not present

## 2015-10-05 DIAGNOSIS — E1151 Type 2 diabetes mellitus with diabetic peripheral angiopathy without gangrene: Secondary | ICD-10-CM | POA: Diagnosis not present

## 2015-10-05 DIAGNOSIS — E119 Type 2 diabetes mellitus without complications: Secondary | ICD-10-CM | POA: Diagnosis not present

## 2015-10-05 DIAGNOSIS — E559 Vitamin D deficiency, unspecified: Secondary | ICD-10-CM | POA: Diagnosis not present

## 2015-10-05 DIAGNOSIS — G609 Hereditary and idiopathic neuropathy, unspecified: Secondary | ICD-10-CM | POA: Diagnosis not present

## 2015-10-07 DIAGNOSIS — D511 Vitamin B12 deficiency anemia due to selective vitamin B12 malabsorption with proteinuria: Secondary | ICD-10-CM | POA: Diagnosis not present

## 2015-10-07 DIAGNOSIS — E875 Hyperkalemia: Secondary | ICD-10-CM | POA: Diagnosis not present

## 2015-10-13 DIAGNOSIS — E875 Hyperkalemia: Secondary | ICD-10-CM | POA: Diagnosis not present

## 2015-10-22 DIAGNOSIS — E875 Hyperkalemia: Secondary | ICD-10-CM | POA: Diagnosis not present

## 2015-11-16 DIAGNOSIS — D510 Vitamin B12 deficiency anemia due to intrinsic factor deficiency: Secondary | ICD-10-CM | POA: Diagnosis not present

## 2015-11-16 DIAGNOSIS — E875 Hyperkalemia: Secondary | ICD-10-CM | POA: Diagnosis not present

## 2015-11-24 ENCOUNTER — Emergency Department (HOSPITAL_COMMUNITY): Payer: Medicare Other

## 2015-11-24 ENCOUNTER — Encounter (HOSPITAL_COMMUNITY): Payer: Self-pay

## 2015-11-24 ENCOUNTER — Inpatient Hospital Stay (HOSPITAL_COMMUNITY)
Admission: EM | Admit: 2015-11-24 | Discharge: 2015-11-25 | DRG: 690 | Disposition: A | Discharge status: Home / Self Care | Payer: Medicare Other | Attending: Internal Medicine | Admitting: Internal Medicine

## 2015-11-24 ENCOUNTER — Telehealth: Payer: Self-pay | Admitting: *Deleted

## 2015-11-24 DIAGNOSIS — Z89612 Acquired absence of left leg above knee: Secondary | ICD-10-CM | POA: Diagnosis not present

## 2015-11-24 DIAGNOSIS — E1151 Type 2 diabetes mellitus with diabetic peripheral angiopathy without gangrene: Secondary | ICD-10-CM | POA: Diagnosis not present

## 2015-11-24 DIAGNOSIS — I129 Hypertensive chronic kidney disease with stage 1 through stage 4 chronic kidney disease, or unspecified chronic kidney disease: Secondary | ICD-10-CM | POA: Diagnosis present

## 2015-11-24 DIAGNOSIS — R7989 Other specified abnormal findings of blood chemistry: Secondary | ICD-10-CM

## 2015-11-24 DIAGNOSIS — J9811 Atelectasis: Secondary | ICD-10-CM | POA: Diagnosis not present

## 2015-11-24 DIAGNOSIS — E875 Hyperkalemia: Secondary | ICD-10-CM | POA: Diagnosis present

## 2015-11-24 DIAGNOSIS — Z95828 Presence of other vascular implants and grafts: Secondary | ICD-10-CM

## 2015-11-24 DIAGNOSIS — I252 Old myocardial infarction: Secondary | ICD-10-CM

## 2015-11-24 DIAGNOSIS — Z7984 Long term (current) use of oral hypoglycemic drugs: Secondary | ICD-10-CM

## 2015-11-24 DIAGNOSIS — Z880 Allergy status to penicillin: Secondary | ICD-10-CM

## 2015-11-24 DIAGNOSIS — Z7982 Long term (current) use of aspirin: Secondary | ICD-10-CM

## 2015-11-24 DIAGNOSIS — E1122 Type 2 diabetes mellitus with diabetic chronic kidney disease: Secondary | ICD-10-CM | POA: Diagnosis present

## 2015-11-24 DIAGNOSIS — I10 Essential (primary) hypertension: Secondary | ICD-10-CM

## 2015-11-24 DIAGNOSIS — N390 Urinary tract infection, site not specified: Secondary | ICD-10-CM | POA: Diagnosis not present

## 2015-11-24 DIAGNOSIS — F17210 Nicotine dependence, cigarettes, uncomplicated: Secondary | ICD-10-CM | POA: Diagnosis present

## 2015-11-24 DIAGNOSIS — Z951 Presence of aortocoronary bypass graft: Secondary | ICD-10-CM

## 2015-11-24 DIAGNOSIS — I2510 Atherosclerotic heart disease of native coronary artery without angina pectoris: Secondary | ICD-10-CM | POA: Diagnosis present

## 2015-11-24 DIAGNOSIS — J449 Chronic obstructive pulmonary disease, unspecified: Secondary | ICD-10-CM | POA: Diagnosis not present

## 2015-11-24 DIAGNOSIS — Z881 Allergy status to other antibiotic agents status: Secondary | ICD-10-CM

## 2015-11-24 DIAGNOSIS — T464X5A Adverse effect of angiotensin-converting-enzyme inhibitors, initial encounter: Secondary | ICD-10-CM | POA: Diagnosis present

## 2015-11-24 DIAGNOSIS — R404 Transient alteration of awareness: Secondary | ICD-10-CM | POA: Diagnosis not present

## 2015-11-24 DIAGNOSIS — Z48812 Encounter for surgical aftercare following surgery on the circulatory system: Secondary | ICD-10-CM

## 2015-11-24 DIAGNOSIS — N183 Chronic kidney disease, stage 3 (moderate): Secondary | ICD-10-CM | POA: Diagnosis not present

## 2015-11-24 DIAGNOSIS — R5383 Other fatigue: Secondary | ICD-10-CM | POA: Diagnosis not present

## 2015-11-24 DIAGNOSIS — E7800 Pure hypercholesterolemia, unspecified: Secondary | ICD-10-CM | POA: Diagnosis present

## 2015-11-24 DIAGNOSIS — I248 Other forms of acute ischemic heart disease: Secondary | ICD-10-CM | POA: Diagnosis present

## 2015-11-24 DIAGNOSIS — Z905 Acquired absence of kidney: Secondary | ICD-10-CM

## 2015-11-24 DIAGNOSIS — R110 Nausea: Secondary | ICD-10-CM | POA: Diagnosis not present

## 2015-11-24 DIAGNOSIS — R6883 Chills (without fever): Secondary | ICD-10-CM | POA: Diagnosis not present

## 2015-11-24 DIAGNOSIS — Z8249 Family history of ischemic heart disease and other diseases of the circulatory system: Secondary | ICD-10-CM

## 2015-11-24 DIAGNOSIS — I739 Peripheral vascular disease, unspecified: Secondary | ICD-10-CM

## 2015-11-24 DIAGNOSIS — R531 Weakness: Secondary | ICD-10-CM | POA: Diagnosis not present

## 2015-11-24 DIAGNOSIS — E1140 Type 2 diabetes mellitus with diabetic neuropathy, unspecified: Secondary | ICD-10-CM

## 2015-11-24 DIAGNOSIS — R778 Other specified abnormalities of plasma proteins: Secondary | ICD-10-CM

## 2015-11-24 DIAGNOSIS — Z91041 Radiographic dye allergy status: Secondary | ICD-10-CM

## 2015-11-24 DIAGNOSIS — R799 Abnormal finding of blood chemistry, unspecified: Secondary | ICD-10-CM | POA: Diagnosis not present

## 2015-11-24 DIAGNOSIS — Z8554 Personal history of malignant neoplasm of ureter: Secondary | ICD-10-CM | POA: Diagnosis not present

## 2015-11-24 LAB — URINALYSIS, ROUTINE W REFLEX MICROSCOPIC
GLUCOSE, UA: NEGATIVE mg/dL
KETONES UR: 40 mg/dL — AB
LEUKOCYTES UA: NEGATIVE
NITRITE: POSITIVE — AB
Specific Gravity, Urine: 1.025 (ref 1.005–1.030)
pH: 6 (ref 5.0–8.0)

## 2015-11-24 LAB — COMPREHENSIVE METABOLIC PANEL
ALK PHOS: 62 U/L (ref 38–126)
ALT: 17 U/L (ref 17–63)
ANION GAP: 13 (ref 5–15)
AST: 19 U/L (ref 15–41)
Albumin: 4.2 g/dL (ref 3.5–5.0)
BILIRUBIN TOTAL: 0.8 mg/dL (ref 0.3–1.2)
BUN: 28 mg/dL — ABNORMAL HIGH (ref 6–20)
CALCIUM: 9.8 mg/dL (ref 8.9–10.3)
CO2: 22 mmol/L (ref 22–32)
Chloride: 101 mmol/L (ref 101–111)
Creatinine, Ser: 1.46 mg/dL — ABNORMAL HIGH (ref 0.61–1.24)
GFR calc non Af Amer: 46 mL/min — ABNORMAL LOW (ref >60)
GFR, EST AFRICAN AMERICAN: 54 mL/min — AB (ref >60)
Glucose, Bld: 152 mg/dL — ABNORMAL HIGH (ref 65–99)
Potassium: 5.5 mmol/L — ABNORMAL HIGH (ref 3.5–5.1)
SODIUM: 136 mmol/L (ref 135–145)
TOTAL PROTEIN: 8.3 g/dL — AB (ref 6.5–8.1)

## 2015-11-24 LAB — CBC WITH DIFFERENTIAL/PLATELET
BASOS ABS: 0 10*3/uL (ref 0.0–0.1)
Basophils Relative: 0 %
EOS ABS: 0 10*3/uL (ref 0.0–0.7)
EOS PCT: 1 %
HCT: 47.7 % (ref 39.0–52.0)
Hemoglobin: 15.8 g/dL (ref 13.0–17.0)
Lymphocytes Relative: 18 %
Lymphs Abs: 1.3 10*3/uL (ref 0.7–4.0)
MCH: 31.5 pg (ref 26.0–34.0)
MCHC: 33.1 g/dL (ref 30.0–36.0)
MCV: 95 fL (ref 78.0–100.0)
Monocytes Absolute: 0.6 10*3/uL (ref 0.1–1.0)
Monocytes Relative: 8 %
Neutro Abs: 5.4 10*3/uL (ref 1.7–7.7)
Neutrophils Relative %: 73 %
PLATELETS: 210 10*3/uL (ref 150–400)
RBC: 5.02 MIL/uL (ref 4.22–5.81)
RDW: 13.8 % (ref 11.5–15.5)
WBC: 7.3 10*3/uL (ref 4.0–10.5)

## 2015-11-24 LAB — BLOOD GAS, VENOUS
ACID-BASE EXCESS: 0.9 mmol/L (ref 0.0–2.0)
Bicarbonate: 23 mEq/L (ref 20.0–24.0)
FIO2: 21
O2 SAT: 84 %
PO2 VEN: 51.4 mmHg — AB (ref 31.0–45.0)
Patient temperature: 37
TCO2: 18.1 mmol/L (ref 0–100)
pCO2, Ven: 43.9 mmHg — ABNORMAL LOW (ref 45.0–50.0)
pH, Ven: 7.355 — ABNORMAL HIGH (ref 7.250–7.300)

## 2015-11-24 LAB — BRAIN NATRIURETIC PEPTIDE: B Natriuretic Peptide: 281 pg/mL — ABNORMAL HIGH (ref 0.0–100.0)

## 2015-11-24 LAB — TROPONIN I
TROPONIN I: 0.04 ng/mL — AB (ref <0.031)
Troponin I: 0.04 ng/mL — ABNORMAL HIGH (ref <0.031)
Troponin I: 0.05 ng/mL — ABNORMAL HIGH (ref <0.031)

## 2015-11-24 LAB — URINE MICROSCOPIC-ADD ON
Squamous Epithelial / LPF: NONE SEEN
WBC UA: NONE SEEN WBC/hpf (ref 0–5)

## 2015-11-24 LAB — I-STAT CG4 LACTIC ACID, ED: LACTIC ACID, VENOUS: 1.61 mmol/L (ref 0.5–2.0)

## 2015-11-24 LAB — POTASSIUM: Potassium: 4.5 mmol/L (ref 3.5–5.1)

## 2015-11-24 MED ORDER — ALBUTEROL SULFATE (2.5 MG/3ML) 0.083% IN NEBU
5.0000 mg | INHALATION_SOLUTION | Freq: Once | RESPIRATORY_TRACT | Status: AC
  Administered 2015-11-24: 5 mg via RESPIRATORY_TRACT
  Filled 2015-11-24: qty 6

## 2015-11-24 MED ORDER — TAMSULOSIN HCL 0.4 MG PO CAPS
0.4000 mg | ORAL_CAPSULE | Freq: Every day | ORAL | Status: DC
  Administered 2015-11-24: 0.4 mg via ORAL
  Filled 2015-11-24: qty 1

## 2015-11-24 MED ORDER — ONDANSETRON HCL 4 MG/2ML IJ SOLN
4.0000 mg | Freq: Four times a day (QID) | INTRAMUSCULAR | Status: DC | PRN
  Administered 2015-11-24: 4 mg via INTRAVENOUS
  Filled 2015-11-24: qty 2

## 2015-11-24 MED ORDER — GABAPENTIN 100 MG PO CAPS
200.0000 mg | ORAL_CAPSULE | Freq: Two times a day (BID) | ORAL | Status: DC
  Administered 2015-11-24 (×2): 200 mg via ORAL
  Filled 2015-11-24 (×2): qty 2

## 2015-11-24 MED ORDER — ALBUTEROL SULFATE (2.5 MG/3ML) 0.083% IN NEBU
2.5000 mg | INHALATION_SOLUTION | Freq: Three times a day (TID) | RESPIRATORY_TRACT | Status: DC
  Administered 2015-11-24 – 2015-11-25 (×2): 2.5 mg via RESPIRATORY_TRACT
  Filled 2015-11-24 (×2): qty 3

## 2015-11-24 MED ORDER — GABAPENTIN 300 MG PO CAPS: ORAL_CAPSULE | ORAL | Status: DC

## 2015-11-24 MED ORDER — SODIUM CHLORIDE 0.9 % IV BOLUS (SEPSIS)
500.0000 mL | Freq: Once | INTRAVENOUS | Status: AC
  Administered 2015-11-24: 500 mL via INTRAVENOUS

## 2015-11-24 MED ORDER — ALBUTEROL SULFATE HFA 108 (90 BASE) MCG/ACT IN AERS: 2.0000 | INHALATION_SPRAY | Freq: Two times a day (BID) | RESPIRATORY_TRACT | Status: DC

## 2015-11-24 MED ORDER — HEPARIN SODIUM (PORCINE) 5000 UNIT/ML IJ SOLN
5000.0000 [IU] | Freq: Three times a day (TID) | INTRAMUSCULAR | Status: DC
  Administered 2015-11-24 – 2015-11-25 (×3): 5000 [IU] via SUBCUTANEOUS
  Filled 2015-11-24 (×3): qty 1

## 2015-11-24 MED ORDER — SODIUM POLYSTYRENE SULFONATE 15 GM/60ML PO SUSP
15.0000 g | Freq: Once | ORAL | Status: AC
  Administered 2015-11-24: 15 g via ORAL
  Filled 2015-11-24: qty 60

## 2015-11-24 MED ORDER — ASPIRIN EC 81 MG PO TBEC
81.0000 mg | DELAYED_RELEASE_TABLET | Freq: Every day | ORAL | Status: DC
  Administered 2015-11-24: 81 mg via ORAL
  Filled 2015-11-24: qty 1

## 2015-11-24 MED ORDER — SODIUM CHLORIDE 0.9% FLUSH
3.0000 mL | Freq: Two times a day (BID) | INTRAVENOUS | Status: DC
  Administered 2015-11-24 (×2): 3 mL via INTRAVENOUS

## 2015-11-24 MED ORDER — HYDRALAZINE HCL 20 MG/ML IJ SOLN: 10.0000 mg | Freq: Four times a day (QID) | INTRAMUSCULAR | Status: DC | PRN

## 2015-11-24 MED ORDER — SODIUM CHLORIDE 0.9 % IV SOLN
INTRAVENOUS | Status: AC
  Administered 2015-11-24: 15:00:00 via INTRAVENOUS

## 2015-11-24 MED ORDER — DEXTROSE 5 % IV SOLN
1.0000 g | Freq: Once | INTRAVENOUS | Status: AC
  Administered 2015-11-24: 1 g via INTRAVENOUS
  Filled 2015-11-24: qty 10

## 2015-11-24 MED ORDER — CARVEDILOL 3.125 MG PO TABS
3.1250 mg | ORAL_TABLET | Freq: Two times a day (BID) | ORAL | Status: DC
  Administered 2015-11-24 – 2015-11-25 (×2): 3.125 mg via ORAL
  Filled 2015-11-24 (×2): qty 1

## 2015-11-24 MED ORDER — SODIUM CHLORIDE 0.9 % IV SOLN
1.0000 g | Freq: Once | INTRAVENOUS | Status: AC
  Administered 2015-11-24: 1 g via INTRAVENOUS
  Filled 2015-11-24: qty 10

## 2015-11-24 MED ORDER — ALBUTEROL SULFATE (2.5 MG/3ML) 0.083% IN NEBU
2.5000 mg | INHALATION_SOLUTION | Freq: Two times a day (BID) | RESPIRATORY_TRACT | Status: DC
  Administered 2015-11-24: 2.5 mg via RESPIRATORY_TRACT
  Filled 2015-11-24: qty 3

## 2015-11-24 MED ORDER — ATORVASTATIN CALCIUM 20 MG PO TABS
20.0000 mg | ORAL_TABLET | Freq: Every day | ORAL | Status: DC
  Administered 2015-11-24: 20 mg via ORAL
  Filled 2015-11-24: qty 1

## 2015-11-24 MED ORDER — DEXTROSE 5 % IV SOLN
1.0000 g | INTRAVENOUS | Status: DC
  Filled 2015-11-24 (×2): qty 10

## 2015-11-24 MED ORDER — CITALOPRAM HYDROBROMIDE 20 MG PO TABS
20.0000 mg | ORAL_TABLET | Freq: Every day | ORAL | Status: DC
  Administered 2015-11-24: 20 mg via ORAL
  Filled 2015-11-24: qty 1

## 2015-11-24 NOTE — ED Notes (Signed)
CRITICAL VALUE ALERT  Critical value received:  PH 7.355  Date of notification:  11/24/15  Time of notification:  [id number]  Critical value read back:Yes.    Nurse who received alert:  Allegra Lai, RN  MD notified (1st page):  Dr R. Lockwood  Time of first page:  1145

## 2015-11-24 NOTE — ED Notes (Signed)
Pt complain of aching all over, cough and not eating or drinking

## 2015-11-24 NOTE — Progress Notes (Signed)
Pharmacy Antibiotic Note  [name] is a 75 y.o. male admitted on 11/24/2015 with UTI.  Pharmacy has been consulted for ceftriaxone dosing.  Plan: Ceftriaxone 1gm iv every 24 hours3/21  Height: 6\' 3"  (190.5 cm) Weight: 211 lb (95.709 kg) IBW/kg (Calculated) : 84.5  Temp (24hrs), Avg:98.2 F (36.8 C), Min:98 F (36.7 C), Max:98.3 F (36.8 C)   Recent Labs Lab 11/24/15 1028 11/24/15 1101  WBC 7.3  --   CREATININE 1.46*  --   LATICACIDVEN  --  1.61    Estimated Creatinine Clearance: 54.7 mL/min (by C-G formula based on Cr of 1.46).    Allergies  Allergen Reactions  . Iohexol      Code: HIVES, Desc: PT REPORTS THROAT SWELLING, HIVES AND ITCHING, 13 HR PRE-MED GIVEN AND SUCCESSFUL-ARS 07/15/07   . Penicillins Itching and Swelling    Throat swelling  . Erythromycin Swelling and Rash    Throat swelling    Antimicrobials this admission: Ceftriaxone 3/21 >>   Microbiology results:  Thank you for allowing pharmacy to be a part of this patient's care. Isac Sarna, BS Pharm D, BCPS Clinical Pharmacist Pager [phone] 11/24/2015 1:40 PM

## 2015-11-24 NOTE — Telephone Encounter (Signed)
Received faxed refill request from [facility] [place] for Gabapentin 300mg , Filled this with 3 refills. Patient will need to follow up with Dr. Trula Slade with LE duplex right and ABIs.  Patient last seen at [place] in April 2016.

## 2015-11-24 NOTE — ED Notes (Signed)
C/o weakness and poor appetite.  Denies any pain or discomfort.

## 2015-11-24 NOTE — H&P (Signed)
[facility] History and Physical  [name] [id number] DOB: 03/25/1941 DOA: 11/24/2015  Referring physician:  PCP: Collene Mares, PA-C  Specialists:   Chief Complaint: generalized weakness   HPI: [name] is a 75 y.o. male with PMH of CAD h/o CABG, PAD, HTN, DM, CKD, COPD, Chronic Tobacco use presented with generalized weakness, fatigue. He complained of generalized weakness, fatigue, for several days. He denies focal neuro weakness, no change in vision or speech, no ataxia. He had some nausea, but no vomiting, no abdominal pains. He reports chronic cough with DOE related to his COPD. But denies acute chest pains, no acute SOB, no fever.  ED: found to have probable UTI, and hyperkalemia   Review of Systems: The patient denies anorexia, fever, weight loss,, vision loss, decreased hearing, hoarseness, chest pain, syncope, dyspnea on exertion, peripheral edema, balance deficits, hemoptysis, abdominal pain, melena, hematochezia, severe indigestion/heartburn, hematuria, incontinence, genital sores, muscle weakness, suspicious skin lesions, transient blindness, difficulty walking, depression, unusual weight change, abnormal bleeding, enlarged lymph nodes, angioedema, and breast masses.    Past Medical History  Diagnosis Date  . Asthma   . Hypertension   . Ureter cancer ([place]) 12/2001    "shut down my kidney & resulted in nephrectomy" (07/09/2013)  . Coronary artery disease   . High cholesterol   . Heart murmur     "had rheumatic fever as a kid" (07/09/2013)  . Rheumatic fever   . Myocardial infarction ([place]) 03/2002; ~ 2011  . Anginal pain ([place])   . Pneumonia     "once" (07/09/2013)  . Shortness of breath     "can happen at anytime" (07/09/2013)  . COPD (chronic obstructive pulmonary disease) ([place])     pt denies this hx on 07/09/2013  . Type II diabetes mellitus ([place])   . H/O hiatal hernia 1997  . Arthritis     "qwhere" (07/09/2013)  . Gout   . Renal disorder     "only  have 1 kidney; due to go back soon to check the other one" (07/09/2013)  . RUPTURE ROTATOR CUFF 12/03/2009    Qualifier: Diagnosis of  By: Aline Brochure MD, Dorothyann Peng     Past Surgical History  Procedure Laterality Date  . Nephrectomy Left 12/19/2001  . Femoral artery stent Right 07/09/2013  . Cholecystectomy  1980's  . Coronary artery bypass graft  ~ 2010    "CABG X5" (07/09/2013)  . Glaucoma surgery Bilateral ?[id number]  . Eye surgery    . Carpal tunnel release Bilateral ~2004; ~ 2011  . Trigger finger release Left ~ 1990's  . Cardiac catheterization  ~ 2010    "before the bypass" (07/09/2013)  . Below knee leg amputation Left 08/2009  . Above knee leg amputation Left 09/2009  . Lower extremity angiogram Right Nov. 4, 2014  . Lower extremity angiogram Right 07/09/2013    Procedure: LOWER EXTREMITY ANGIOGRAM;  Surgeon: Serafina Mitchell, MD;  Location: [facility] CATH LAB;  Service: Cardiovascular;  Laterality: Right;  . Peripheral vascular catheterization N/A 02/03/2015    Procedure: Abdominal Aortogram;  Surgeon: Serafina Mitchell, MD;  Location: [facility] CV LAB;  Service: Cardiovascular;  Laterality: N/A;   Social History:  reports that he has been smoking Cigarettes.  He has a 54 pack-year smoking history. He has never used smokeless tobacco. He reports that he drinks alcohol. He reports that he does not use illicit drugs. Home.;  where does patient live--home, ALF, SNF? and with whom if at home? Yes;  Can patient participate in ADLs?  Allergies  Allergen Reactions  . Iohexol      Code: HIVES, Desc: PT REPORTS THROAT SWELLING, HIVES AND ITCHING, 13 HR PRE-MED GIVEN AND SUCCESSFUL-ARS 07/15/07   . Penicillins Itching and Swelling    Throat swelling  . Erythromycin Swelling and Rash    Throat swelling    Family History  Problem Relation Age of Onset  . Cancer Mother   . Heart attack Father     (be sure to complete)  Prior to Admission medications   Medication Sig Start Date End Date  Taking? Authorizing Provider  albuterol (PROAIR HFA) 108 (90 BASE) MCG/ACT inhaler Inhale 2 puffs into the lungs every 12 (twelve) hours. And then every 4 hours as needed. 09/15/14   Rexene Alberts, MD  aspirin EC 81 MG EC tablet Take 1 tablet (81 mg total) by mouth daily. 07/10/13   Regina J Roczniak, PA-C  citalopram (CELEXA) 20 MG tablet Take 20 mg by mouth daily.     Historical Provider, MD  diclofenac sodium (VOLTAREN) 1 % GEL Apply 2 g topically 3 (three) times daily. To left shoulder Patient not taking: Reported on 01/29/2015 09/15/14   Rexene Alberts, MD  feeding supplement, ENSURE COMPLETE, (ENSURE COMPLETE) LIQD Take 237 mLs by mouth 2 (two) times daily between meals. Patient not taking: Reported on 01/29/2015 09/15/14   Rexene Alberts, MD  folic acid (FOLVITE) 1 MG tablet Take 1 tablet (1 mg total) by mouth daily. Patient not taking: Reported on 01/29/2015 08/27/14   Erline Hau, MD  gabapentin (NEURONTIN) 300 MG capsule Take 2 capsules, po, in the morning and 3 capsules, po, in the evening 06/03/15   Serafina Mitchell, MD  ketoconazole (NIZORAL) 2 % cream Apply 1 application topically 2 (two) times daily.    Historical Provider, MD  metFORMIN (GLUCOPHAGE) 500 MG tablet Take 500 mg by mouth 2 (two) times daily with a meal.    Historical Provider, MD  Multiple Vitamins-Iron (MULTIVITAMINS WITH IRON) TABS tablet Take 1 tablet by mouth daily. Patient not taking: Reported on 01/29/2015 09/15/14   Rexene Alberts, MD  nitroGLYCERIN (NITROLINGUAL) 0.4 MG/SPRAY spray Place 1 spray under the tongue every 5 (five) minutes as needed for chest pain.    Historical Provider, MD  OLANZapine (ZYPREXA) 2.5 MG tablet Take 1 tablet (2.5 mg total) by mouth at bedtime. Patient not taking: Reported on 01/29/2015 09/15/14   Rexene Alberts, MD  potassium chloride SA (K-DUR,KLOR-CON) 10 MEQ tablet Take 1 tablet (10 mEq total) by mouth daily. For 2 weeks and then recheck serum potassium. Patient not taking: Reported  on 01/29/2015 09/15/14   Rexene Alberts, MD  ramipril (ALTACE) 10 MG capsule Take 10 mg by mouth daily.    Historical Provider, MD  senna-docusate (SENOKOT-S) 8.6-50 MG per tablet Take 1 tablet by mouth at bedtime. Patient taking differently: Take 1 tablet by mouth 2 (two) times daily.  09/15/14   Rexene Alberts, MD  Tamsulosin HCl (FLOMAX) 0.4 MG CAPS Take 0.4 mg by mouth 2 (two) times daily.     Historical Provider, MD  thiamine 100 MG tablet Take 1 tablet (100 mg total) by mouth daily. Patient not taking: Reported on 01/29/2015 08/27/14   Erline Hau, MD  traZODone (DESYREL) 50 MG tablet Take 1 tablet (50 mg total) by mouth at bedtime. Patient not taking: Reported on 01/29/2015 09/15/14   Rexene Alberts, MD   Physical Exam: Filed Vitals:   11/24/15 1100 11/24/15  1130  BP: 162/106 173/94  Pulse: 110 112  Temp:    Resp: 24 16     General:  Alert. No distress   Eyes: eom-i, perrla   ENT: no oral ulcers   Neck: supple, no JVD   Cardiovascular: s1,s2 tachycardia   Respiratory: diminished LL  Abdomen: soft, nt,nd   Skin: no rash   Musculoskeletal: L AKA  Psychiatric: no hallucinations   Neurologic: CN 2-12 intact, non focal exam   Labs on Admission:  Basic Metabolic Panel:  Recent Labs Lab 11/24/15 1028  NA 136  K 5.5*  CL 101  CO2 22  GLUCOSE 152*  BUN 28*  CREATININE 1.46*  CALCIUM 9.8   Liver Function Tests:  Recent Labs Lab 11/24/15 1028  AST 19  ALT 17  ALKPHOS 62  BILITOT 0.8  PROT 8.3*  ALBUMIN 4.2   No results for input(s): LIPASE, AMYLASE in the last 168 hours. No results for input(s): AMMONIA in the last 168 hours. CBC:  Recent Labs Lab 11/24/15 1028  WBC 7.3  NEUTROABS 5.4  HGB 15.8  HCT 47.7  MCV 95.0  PLT 210   Cardiac Enzymes:  Recent Labs Lab 11/24/15 1028  TROPONINI 0.04*    BNP (last 3 results)  Recent Labs  11/24/15 1020  BNP 281.0*    ProBNP (last 3 results) No results for input(s): PROBNP in the  last 8760 hours.  CBG: No results for input(s): GLUCAP in the last 168 hours.  Radiological Exams on Admission: Dg Chest 2 View  11/24/2015  CLINICAL DATA:  Generalized weakness for a couple of days, nausea, EXAM: CHEST  2 VIEW COMPARISON:  10/02/2015 FINDINGS: Cardiomediastinal silhouette is stable. Again noted status post median sternotomy. No acute infiltrate or pulmonary edema. Mild degenerative changes lower thoracic spine. Mild basilar atelectasis. IMPRESSION: No active disease. Status post median sternotomy. Mild basilar atelectasis. Mild degenerative changes lower thoracic spine. Electronically Signed   By: Lahoma [facility] M.D.   On: 11/24/2015 11:17    EKG: Independently reviewed.   Assessment/Plan Active Problems:   Hyperkalemia  PMH of CAD h/o CABG, PAD, HTN, DM, CKD, COPD, Chronic Tobacco use presented with generalized weakness, fatigue  Mild Hyperkalemia in the setting of CKD and medication use (ramipril). Patient received albuterol, Ca in ED. Also mild AKI -will give IVF, kayexalate, repeat labs, hold ramipril. Monitor on tele  UTI. Started on IV atx (recieved ceftriaxone in ED, no allergy), pend cultures. Afebrile. hemodynamically stable  HTN. Not on goal. Will hold ramipril due to hyperkalemia. Start coreg may help due h/o CAD and tachycardia   COPD, Chronic Tobacco. CXR: no clear infiltrate. Cont prn bronchodilators, stop smoking   CAD h/o CABG. No acute chest pains. Initial trop 0.04. Will obtain serial trop. Cont ASA, add statins, BB     None;  if consultant consulted, please document name and whether formally or informally consulted  Code Status: full (must indicate code status--if unknown or must be presumed, indicate so) Family Communication: d/w patient, ED/MD (indicate person spoken with, if applicable, with phone number if by telephone) Disposition Plan: home 24-48 hrs  (indicate anticipated LOS)  Time spent: >45 minutes   Kinnie Feil [facility] Pager [phone]  If 7PM-7AM, please contact night-coverage [email or web address] Password [facility] 11/24/2015, 12:35 PM

## 2015-11-24 NOTE — ED Provider Notes (Signed)
History  By signing my name below, I, [name], attest that this documentation has been prepared under the direction and in the presence of Carmin Muskrat, MD. Electronically Signed: Marlowe Chambers, ED Scribe. 11/24/2015. 11:26 AM  Chief Complaint  Patient presents with  . Fatigue   The history is provided by the patient and medical records. No language interpreter was used.    HPI Comments:  [name] is a 75 y.o. male who presents to the Emergency Department complaining of fatigue and generalized weakness that began a couple days ago. He reports associated lack of appetite and has not been drinking fluids normally. Wife reports pt is now breaking out in a rash and is nauseous with dry heaves. He reports his last CBG was 149. He reports he was told to stop taking Flomax recently. He has not taken anything to treat his symptoms. He denies modifying factors. He denies vomiting, abdominal pain, HA, fever, chills. PMHx of DM, COPD. Past surgical of CABG, kidney removal and bka and aka of LLE. Pt states he is a smoker.  Past Medical History  Diagnosis Date  . Asthma   . Hypertension   . Ureter cancer ([place]) 12/2001    "shut down my kidney & resulted in nephrectomy" (07/09/2013)  . Coronary artery disease   . High cholesterol   . Heart murmur     "had rheumatic fever as a kid" (07/09/2013)  . Rheumatic fever   . Myocardial infarction ([place]) 03/2002; ~ 2011  . Anginal pain ([place])   . Pneumonia     "once" (07/09/2013)  . Shortness of breath     "can happen at anytime" (07/09/2013)  . COPD (chronic obstructive pulmonary disease) ([place])     pt denies this hx on 07/09/2013  . Type II diabetes mellitus ([place])   . H/O hiatal hernia 1997  . Arthritis     "qwhere" (07/09/2013)  . Gout   . Renal disorder     "only have 1 kidney; due to go back soon to check the other one" (07/09/2013)  . RUPTURE ROTATOR CUFF 12/03/2009    Qualifier: Diagnosis of  By: Aline Brochure MD, Dorothyann Peng     Past  Surgical History  Procedure Laterality Date  . Nephrectomy Left 12/19/2001  . Femoral artery stent Right 07/09/2013  . Cholecystectomy  1980's  . Coronary artery bypass graft  ~ 2010    "CABG X5" (07/09/2013)  . Glaucoma surgery Bilateral ?[id number]  . Eye surgery    . Carpal tunnel release Bilateral ~2004; ~ 2011  . Trigger finger release Left ~ 1990's  . Cardiac catheterization  ~ 2010    "before the bypass" (07/09/2013)  . Below knee leg amputation Left 08/2009  . Above knee leg amputation Left 09/2009  . Lower extremity angiogram Right Nov. 4, 2014  . Lower extremity angiogram Right 07/09/2013    Procedure: LOWER EXTREMITY ANGIOGRAM;  Surgeon: Serafina Mitchell, MD;  Location: [facility] CATH LAB;  Service: Cardiovascular;  Laterality: Right;  . Peripheral vascular catheterization N/A 02/03/2015    Procedure: Abdominal Aortogram;  Surgeon: Serafina Mitchell, MD;  Location: [facility];  Service: Cardiovascular;  Laterality: N/A;   Family History  Problem Relation Age of Onset  . Cancer Mother   . Heart attack Father    Social History  Substance Use Topics  . Smoking status: Current Every Day Smoker -- 1.00 packs/day for 54 years    Types: Cigarettes  . Smokeless tobacco: Never Used  .  Alcohol Use: 0.0 oz/week     Comment: social drinker    Review of Systems  Constitutional:       Per HPI, otherwise negative  HENT:       Per HPI, otherwise negative  Respiratory:       Per HPI, otherwise negative  Cardiovascular:       Per HPI, otherwise negative  Gastrointestinal: Negative for vomiting.  Endocrine:       Negative aside from HPI  Genitourinary:       Neg aside from HPI   Musculoskeletal:       Per HPI, otherwise negative  Skin: Negative.   Neurological: Negative for syncope.    Allergies  Iohexol; Penicillins; and Erythromycin  Home Medications   Prior to Admission medications   Medication Sig Start Date End Date Taking? Authorizing Provider  albuterol (PROAIR  HFA) 108 (90 BASE) MCG/ACT inhaler Inhale 2 puffs into the lungs every 12 (twelve) hours. And then every 4 hours as needed. 09/15/14   Rexene Alberts, MD  aspirin EC 81 MG EC tablet Take 1 tablet (81 mg total) by mouth daily. 07/10/13   Regina J Roczniak, PA-C  citalopram (CELEXA) 20 MG tablet Take 20 mg by mouth daily.     Historical Provider, MD  diclofenac sodium (VOLTAREN) 1 % GEL Apply 2 g topically 3 (three) times daily. To left shoulder Patient not taking: Reported on 01/29/2015 09/15/14   Rexene Alberts, MD  feeding supplement, ENSURE COMPLETE, (ENSURE COMPLETE) LIQD Take 237 mLs by mouth 2 (two) times daily between meals. Patient not taking: Reported on 01/29/2015 09/15/14   Rexene Alberts, MD  folic acid (FOLVITE) 1 MG tablet Take 1 tablet (1 mg total) by mouth daily. Patient not taking: Reported on 01/29/2015 08/27/14   Erline Hau, MD  gabapentin (NEURONTIN) 300 MG capsule Take 2 capsules, po, in the morning and 3 capsules, po, in the evening 06/03/15   Serafina Mitchell, MD  ketoconazole (NIZORAL) 2 % cream Apply 1 application topically 2 (two) times daily.    Historical Provider, MD  metFORMIN (GLUCOPHAGE) 500 MG tablet Take 500 mg by mouth 2 (two) times daily with a meal.    Historical Provider, MD  Multiple Vitamins-Iron (MULTIVITAMINS WITH IRON) TABS tablet Take 1 tablet by mouth daily. Patient not taking: Reported on 01/29/2015 09/15/14   Rexene Alberts, MD  nitroGLYCERIN (NITROLINGUAL) 0.4 MG/SPRAY spray Place 1 spray under the tongue every 5 (five) minutes as needed for chest pain.    Historical Provider, MD  OLANZapine (ZYPREXA) 2.5 MG tablet Take 1 tablet (2.5 mg total) by mouth at bedtime. Patient not taking: Reported on 01/29/2015 09/15/14   Rexene Alberts, MD  potassium chloride SA (K-DUR,KLOR-CON) 10 MEQ tablet Take 1 tablet (10 mEq total) by mouth daily. For 2 weeks and then recheck serum potassium. Patient not taking: Reported on 01/29/2015 09/15/14   Rexene Alberts, MD   ramipril (ALTACE) 10 MG capsule Take 10 mg by mouth daily.    Historical Provider, MD  senna-docusate (SENOKOT-S) 8.6-50 MG per tablet Take 1 tablet by mouth at bedtime. Patient taking differently: Take 1 tablet by mouth 2 (two) times daily.  09/15/14   Rexene Alberts, MD  Tamsulosin HCl (FLOMAX) 0.4 MG CAPS Take 0.4 mg by mouth 2 (two) times daily.     Historical Provider, MD  thiamine 100 MG tablet Take 1 tablet (100 mg total) by mouth daily. Patient not taking: Reported on 01/29/2015 08/27/14   Rayford Halsted  Isaac Bliss, MD  traZODone (DESYREL) 50 MG tablet Take 1 tablet (50 mg total) by mouth at bedtime. Patient not taking: Reported on 01/29/2015 09/15/14   Rexene Alberts, MD   Triage Vitals: BP 182/112 mmHg  Pulse 107  Temp(Src) 98 F (36.7 C) (Oral)  Resp 22  Ht 6\' 2"  (1.88 m)  Wt 162 lb (73.483 kg)  BMI 20.79 kg/m2  SpO2 96% Physical Exam  Constitutional: He is oriented to person, place, and time. He appears well-developed. No distress.  HENT:  Head: Normocephalic and atraumatic.  Eyes: Conjunctivae and EOM are normal.  Cardiovascular: Normal rate and regular rhythm.   Pulmonary/Chest: Effort normal and breath sounds normal. No stridor. No respiratory distress. He has no wheezes. He has no rales.  Abdominal: Soft. He exhibits no distension. There is no tenderness.  Musculoskeletal: He exhibits no edema.  LLE above the knee amputation.  Neurological: He is alert and oriented to person, place, and time.  Skin: Skin is warm. He is diaphoretic.  Psychiatric: He has a normal mood and affect.  Nursing note and vitals reviewed.   ED Course  Procedures (including critical care time) DIAGNOSTIC STUDIES: Oxygen Saturation is 96% on RA, adequate by my interpretation.   COORDINATION OF CARE: 9:42 AM- Will order imaging and labs. Pt verbalizes understanding and agrees to plan.  11:44 AM- Informed pt that he has a UTI and will be admitted. Will order antibiotics.   Medications   sodium chloride 0.9 % bolus 500 mL (500 mLs Intravenous New Bag/Given 11/24/15 1100)    Labs Review Labs Reviewed  COMPREHENSIVE METABOLIC PANEL - Abnormal; Notable for the following:    Potassium 5.5 (*)    Glucose, Bld 152 (*)    BUN 28 (*)    Creatinine, Ser 1.46 (*)    Total Protein 8.3 (*)    GFR calc non Af Amer 46 (*)    GFR calc Af Amer 54 (*)    All other components within normal limits  BRAIN NATRIURETIC PEPTIDE - Abnormal; Notable for the following:    B Natriuretic Peptide 281.0 (*)    All other components within normal limits  TROPONIN I - Abnormal; Notable for the following:    Troponin I 0.04 (*)    All other components within normal limits  URINALYSIS, ROUTINE W REFLEX MICROSCOPIC (NOT AT [facility]) - Abnormal; Notable for the following:    APPearance HAZY (*)    Hgb urine dipstick LARGE (*)    Bilirubin Urine SMALL (*)    Ketones, ur 40 (*)    Protein, ur >300 (*)    Nitrite POSITIVE (*)    All other components within normal limits  URINE MICROSCOPIC-ADD ON - Abnormal; Notable for the following:    Bacteria, UA MANY (*)    All other components within normal limits  CBC WITH DIFFERENTIAL/PLATELET  BLOOD GAS, VENOUS  I-STAT CG4 LACTIC ACID, ED    Imaging Review Dg Chest 2 View  11/24/2015  CLINICAL DATA:  Generalized weakness for a couple of days, nausea, EXAM: CHEST  2 VIEW COMPARISON:  10/02/2015 FINDINGS: Cardiomediastinal silhouette is stable. Again noted status post median sternotomy. No acute infiltrate or pulmonary edema. Mild degenerative changes lower thoracic spine. Mild basilar atelectasis. IMPRESSION: No active disease. Status post median sternotomy. Mild basilar atelectasis. Mild degenerative changes lower thoracic spine. Electronically Signed   By: Lahoma [facility] M.D.   On: 11/24/2015 11:17   I have personally reviewed and evaluated these images and lab  results as part of my medical decision-making.   EKG Interpretation   Date/Time:  Tuesday November 24 2015 09:27:52 EDT Ventricular Rate:  106 PR Interval:  148 QRS Duration: 116 QT Interval:  355 QTC Calculation: 471 R Axis:   82 Text Interpretation:  Sinus tachycardia Nonspecific intraventricular  conduction delay Anterior infarct, old Nonspecific T abnormalities,  lateral leads Sinus tachycardia Non-specific intra-ventricular conduction  delay T wave abnormality No significant change since last tracing Abnormal  ekg Confirmed by Carmin Muskrat  MD [phone]) on 11/24/2015 9:30:42 AM     Labs notable for urinary tract infection, hyperkalemia, troponin elevation. Patient has no chest pain. However, the patient does have cardiomyopathy, vasculopathy, and her symptoms suspicion for demand ischemia. Patient has received empiric fluid resuscitation, will continue to receive fluids, now with antibiotics as well.  With concern for hyperkalemia, patient will receive calcium, albuterol.  On re-eval the patient remains tachycardic.  He and wife are aware of all results. MDM   I personally performed the services described in this documentation, which was scribed in my presence. The recorded information has been reviewed and is accurate.    This 75 year old male with multiple medical issues including vasculopathy, diabetes, ischemic cardiomyopathy presents with generalized weakness, anorexia. Patient is found to have urinary tract infection, hyperkalemia, troponin elevation. Patient's EKG is not remarkable for changes compared to prior, does have some T-wave abnormalities. Patient denies specific chest pain, there is some suspicion for demand ischemia given his medical issues, and his ongoing infection. Patient received fluid resuscitation, as well as antibiotics. Given the patient's elevated troponin, urinary tract infection, hyperkalemia, need for fluids, antibiotics, monitoring, the patient was admitted for further evaluation, management.  CRITICAL CARE Performed by: Carmin Muskrat Total  critical care time: 35 minutes Critical care time was exclusive of separately billable procedures and treating other patients. Critical care was necessary to treat or prevent imminent or life-threatening deterioration. Critical care was time spent personally by me on the following activities: development of treatment plan with patient and/or surrogate as well as nursing, discussions with consultants, evaluation of patient's response to treatment, examination of patient, obtaining history from patient or surrogate, ordering and performing treatments and interventions, ordering and review of laboratory studies, ordering and review of radiographic studies, pulse oximetry and re-evaluation of patient's condition.   Carmin Muskrat, MD 11/24/15 1157

## 2015-11-25 DIAGNOSIS — J449 Chronic obstructive pulmonary disease, unspecified: Secondary | ICD-10-CM

## 2015-11-25 DIAGNOSIS — E875 Hyperkalemia: Secondary | ICD-10-CM | POA: Diagnosis not present

## 2015-11-25 DIAGNOSIS — R531 Weakness: Secondary | ICD-10-CM

## 2015-11-25 DIAGNOSIS — I248 Other forms of acute ischemic heart disease: Secondary | ICD-10-CM

## 2015-11-25 DIAGNOSIS — N390 Urinary tract infection, site not specified: Principal | ICD-10-CM

## 2015-11-25 LAB — BASIC METABOLIC PANEL
Anion gap: 10 (ref 5–15)
BUN: 28 mg/dL — AB (ref 6–20)
CHLORIDE: 100 mmol/L — AB (ref 101–111)
CO2: 23 mmol/L (ref 22–32)
Calcium: 8.9 mg/dL (ref 8.9–10.3)
Creatinine, Ser: 1.34 mg/dL — ABNORMAL HIGH (ref 0.61–1.24)
GFR calc Af Amer: 59 mL/min — ABNORMAL LOW (ref >60)
GFR, EST NON AFRICAN AMERICAN: 51 mL/min — AB (ref >60)
GLUCOSE: 140 mg/dL — AB (ref 65–99)
POTASSIUM: 4.6 mmol/L (ref 3.5–5.1)
Sodium: 133 mmol/L — ABNORMAL LOW (ref 135–145)

## 2015-11-25 LAB — HEMOGLOBIN A1C
Hgb A1c MFr Bld: 6.5 % — ABNORMAL HIGH (ref 4.8–5.6)
Mean Plasma Glucose: 140 mg/dL

## 2015-11-25 LAB — TROPONIN I: TROPONIN I: 0.06 ng/mL — AB (ref <0.031)

## 2015-11-25 MED ORDER — CARVEDILOL 3.125 MG PO TABS: 3.1250 mg | ORAL_TABLET | Freq: Two times a day (BID) | ORAL | Status: DC

## 2015-11-25 MED ORDER — AMLODIPINE BESYLATE 5 MG PO TABS: 5.0000 mg | ORAL_TABLET | Freq: Every day | ORAL | Status: DC

## 2015-11-25 MED ORDER — ZOLPIDEM TARTRATE 5 MG PO TABS
5.0000 mg | ORAL_TABLET | Freq: Every evening | ORAL | Status: DC | PRN
  Administered 2015-11-25: 5 mg via ORAL
  Filled 2015-11-25: qty 1

## 2015-11-25 MED ORDER — ALBUTEROL SULFATE HFA 108 (90 BASE) MCG/ACT IN AERS: 2.0000 | INHALATION_SPRAY | Freq: Two times a day (BID) | RESPIRATORY_TRACT | Status: DC

## 2015-11-25 MED ORDER — CIPROFLOXACIN HCL 250 MG PO TABS: 250.0000 mg | ORAL_TABLET | Freq: Two times a day (BID) | ORAL | Status: DC

## 2015-11-25 NOTE — Progress Notes (Signed)
Change in pt's loc, as pt appears to be confused, unaware of date/time, and has inappropriate conversation. Pt has removed his iv stating that he is ready to go home, and refuses to let me start another iv. Attempts were made to notify wife and a voice mail was left. Dr. Claria Dice was made aware of pt's change and visited pt's room. 5mg  Ambien was ordered and administered. Pt still insists on leaving and has made several attempts to notify his wife, but is currently lying in bed watching tv.

## 2015-11-25 NOTE — Discharge Summary (Addendum)
Physician Discharge Summary  [name] [id number] DOB: 11-11-1940 DOA: 11/24/2015  PCP: Collene Mares, PA-C  Admit date: 11/24/2015 Discharge date: 11/25/2015  Time spent: 35 minutes  Recommendations for Outpatient Follow-up:  1. Follow up with PCP in 1-2 weeks 2. Repeat BMET on follow up 3. Ramipril discontinued due to hyperkalemia   Discharge Diagnoses:  Active Problems:   Hyperkalemia   UTI (lower urinary tract infection) Generalized weakness Acute kidney injury COPD CAD Elevated troponin, likely demand ischemia CKD stage 3  Discharge Condition: Improved   Diet recommendation: Heart healthy   Filed Weights   11/24/15 0917 11/24/15 1333  Weight: 73.483 kg (162 lb) 95.709 kg (211 lb)    History of present illness:  64 yom with PMH CAD h/o CABG, PAD, HTN, DM, CKD, and COPD presented with complaints of geberalized weakness and fatigue that onset several days ago. While in the ED found to have probable UTI and hyperkalemia.   Hospital Course:  On admission patient was noted to have UTI revealed on UA. Patient started on IV abx. Patient remained afebrile and hemodynamically stable. UC in process. He has been afebrile and feels clinically improved. Will transition to a course of oral cipro.   Mild hyperkalemia was noted on admission. This is in the setting of CKD stage 3 and medication use (Ramipril) Patient started on kayexalate and ramipril was held.  Potassium has improved on discharge  1. Essential HTN, Ramipril held in the setting of hyperkalemia. Started on Coreg and Amlodipine h/o CAD and tachycardia. Blood pressure remains elevated. Patient will need follow up with primary care physician for further adjustments 2. Elevated troponin likely demand ischemia. No chest pain or ekg changes. Patient does not wish to stay in the hospital for further evaluation including echo. Wants to leave the hospital immediately. Follow up with pcp/cardiologist 3. COPD with tobacco  use, Stable. Bronchodilator PRN  4. Tobacco Use. Counseled on smoking cessation.    Procedures:  None  Consultations:  None   Discharge Exam: Filed Vitals:   11/24/15 2100 11/25/15 0500  BP: 169/99 193/92  Pulse: 100 97  Temp: 98.4 F (36.9 C) 98 F (36.7 C)  Resp: 16 20     General: NAD, looks comfortable  Cardiovascular: RRR, S1, S2   Respiratory: clear bilaterally, No wheezing, rales or rhonchi  Abdomen: soft, non tender, no distention , bowel sounds normal  Musculoskeletal: no edema in RLE, left AKA  Discharge Instructions   Discharge Instructions    Diet - low sodium heart healthy    Complete by:  As directed      Increase activity slowly    Complete by:  As directed           Current Discharge Medication List    START taking these medications   Details  amLODipine (NORVASC) 5 MG tablet Take 1 tablet (5 mg total) by mouth daily. Qty: 30 tablet, Refills: 1    carvedilol (COREG) 3.125 MG tablet Take 1 tablet (3.125 mg total) by mouth 2 (two) times daily with a meal. Qty: 60 tablet, Refills: 1    ciprofloxacin (CIPRO) 250 MG tablet Take 1 tablet (250 mg total) by mouth 2 (two) times daily. Qty: 10 tablet, Refills: 0      CONTINUE these medications which have CHANGED   Details  albuterol (PROAIR HFA) 108 (90 Base) MCG/ACT inhaler Inhale 2 puffs into the lungs every 12 (twelve) hours. And then every 4 hours as needed. Qty: 1 Inhaler, Refills: 0  CONTINUE these medications which have NOT CHANGED   Details  aspirin EC 81 MG EC tablet Take 1 tablet (81 mg total) by mouth daily.    gabapentin (NEURONTIN) 300 MG capsule Take 2 capsules, po, in the morning and 3 capsules, po, in the evening Qty: 180 capsule, Refills: 3   Associated Diagnoses: Diabetic neuropathy, painful (HCC)    ketoconazole (NIZORAL) 2 % cream Apply 1 application topically daily as needed for irritation.     ketoconazole (NIZORAL) 200 MG tablet Take 200 mg by mouth daily.     metFORMIN (GLUCOPHAGE) 500 MG tablet Take 500 mg by mouth 2 (two) times daily with a meal.    pioglitazone (ACTOS) 30 MG tablet Take 30 mg by mouth daily.    senna-docusate (SENOKOT-S) 8.6-50 MG per tablet Take 1 tablet by mouth at bedtime.    zolpidem (AMBIEN) 10 MG tablet Take 20 mg by mouth at bedtime.    Tamsulosin HCl (FLOMAX) 0.4 MG CAPS Take 0.4 mg by mouth 2 (two) times daily.       STOP taking these medications     ramipril (ALTACE) 10 MG capsule      citalopram (CELEXA) 20 MG tablet        Allergies  Allergen Reactions  . Iohexol      Code: HIVES, Desc: PT REPORTS THROAT SWELLING, HIVES AND ITCHING, 13 HR PRE-MED GIVEN AND SUCCESSFUL-ARS 07/15/07   . Penicillins Itching and Swelling    Throat swelling  . Erythromycin Swelling and Rash    Throat swelling      The results of significant diagnostics from this hospitalization (including imaging, microbiology, ancillary and laboratory) are listed below for reference.    Significant Diagnostic Studies: Dg Chest 2 View  11/24/2015  CLINICAL DATA:  Generalized weakness for a couple of days, nausea, EXAM: CHEST  2 VIEW COMPARISON:  10/02/2015 FINDINGS: Cardiomediastinal silhouette is stable. Again noted status post median sternotomy. No acute infiltrate or pulmonary edema. Mild degenerative changes lower thoracic spine. Mild basilar atelectasis. IMPRESSION: No active disease. Status post median sternotomy. Mild basilar atelectasis. Mild degenerative changes lower thoracic spine. Electronically Signed   By: Lahoma Crocker M.D.   On: 11/24/2015 11:17    Microbiology: No results found for this or any previous visit (from the past 240 hour(s)).   Labs: Basic Metabolic Panel:  Recent Labs Lab 11/24/15 1028 11/24/15 1956 11/25/15 0140  NA 136  --  133*  K 5.5* 4.5 4.6  CL 101  --  100*  CO2 22  --  23  GLUCOSE 152*  --  140*  BUN 28*  --  28*  CREATININE 1.46*  --  1.34*  CALCIUM 9.8  --  8.9   Liver Function  Tests:  Recent Labs Lab 11/24/15 1028  AST 19  ALT 17  ALKPHOS 62  BILITOT 0.8  PROT 8.3*  ALBUMIN 4.2   CBC:  Recent Labs Lab 11/24/15 1028  WBC 7.3  NEUTROABS 5.4  HGB 15.8  HCT 47.7  MCV 95.0  PLT 210   Cardiac Enzymes:  Recent Labs Lab 11/24/15 1028 11/24/15 1358 11/24/15 1956 11/25/15 0140  TROPONINI 0.04* 0.04* 0.05* 0.06*   BNP: BNP (last 3 results)  Recent Labs  11/24/15 1020  BNP 281.0*     Signed:  Kathie Dike, MD   [facility] 11/25/2015, 9:16 AM

## 2015-11-25 NOTE — Progress Notes (Signed)
Discharged to home with family.

## 2015-11-25 NOTE — Telephone Encounter (Signed)
LM for pt re appt, dpm °

## 2015-12-01 DIAGNOSIS — N342 Other urethritis: Secondary | ICD-10-CM | POA: Diagnosis not present

## 2015-12-01 DIAGNOSIS — Z681 Body mass index (BMI) 19 or less, adult: Secondary | ICD-10-CM | POA: Diagnosis not present

## 2015-12-01 DIAGNOSIS — Z1389 Encounter for screening for other disorder: Secondary | ICD-10-CM | POA: Diagnosis not present

## 2015-12-01 DIAGNOSIS — E875 Hyperkalemia: Secondary | ICD-10-CM | POA: Diagnosis not present

## 2015-12-03 DIAGNOSIS — Z681 Body mass index (BMI) 19 or less, adult: Secondary | ICD-10-CM | POA: Diagnosis not present

## 2015-12-03 DIAGNOSIS — Z1389 Encounter for screening for other disorder: Secondary | ICD-10-CM | POA: Diagnosis not present

## 2015-12-03 DIAGNOSIS — E875 Hyperkalemia: Secondary | ICD-10-CM | POA: Diagnosis not present

## 2015-12-03 DIAGNOSIS — N342 Other urethritis: Secondary | ICD-10-CM | POA: Diagnosis not present

## 2015-12-23 DIAGNOSIS — Z681 Body mass index (BMI) 19 or less, adult: Secondary | ICD-10-CM | POA: Diagnosis not present

## 2015-12-23 DIAGNOSIS — L03031 Cellulitis of right toe: Secondary | ICD-10-CM | POA: Diagnosis not present

## 2015-12-23 DIAGNOSIS — Z1389 Encounter for screening for other disorder: Secondary | ICD-10-CM | POA: Diagnosis not present

## 2015-12-23 DIAGNOSIS — E538 Deficiency of other specified B group vitamins: Secondary | ICD-10-CM | POA: Diagnosis not present

## 2016-01-08 ENCOUNTER — Encounter: Payer: Self-pay | Admitting: Surgery

## 2016-01-11 ENCOUNTER — Ambulatory Visit (HOSPITAL_COMMUNITY): Payer: Medicare Other

## 2016-01-18 ENCOUNTER — Ambulatory Visit: Payer: Medicare Other | Admitting: Surgery

## 2016-01-19 ENCOUNTER — Encounter (HOSPITAL_COMMUNITY): Payer: Medicare Other

## 2016-01-19 ENCOUNTER — Encounter: Payer: Self-pay | Admitting: Surgery

## 2016-01-25 ENCOUNTER — Ambulatory Visit: Payer: Medicare Other | Admitting: Surgery

## 2016-01-25 ENCOUNTER — Ambulatory Visit (HOSPITAL_COMMUNITY): Payer: Medicare Other | Attending: Surgery

## 2016-02-24 DIAGNOSIS — F419 Anxiety disorder, unspecified: Secondary | ICD-10-CM | POA: Diagnosis not present

## 2016-02-24 DIAGNOSIS — G894 Chronic pain syndrome: Secondary | ICD-10-CM | POA: Diagnosis not present

## 2016-02-24 DIAGNOSIS — K439 Ventral hernia without obstruction or gangrene: Secondary | ICD-10-CM | POA: Diagnosis not present

## 2016-02-24 DIAGNOSIS — Z681 Body mass index (BMI) 19 or less, adult: Secondary | ICD-10-CM | POA: Diagnosis not present

## 2016-02-24 DIAGNOSIS — E538 Deficiency of other specified B group vitamins: Secondary | ICD-10-CM | POA: Diagnosis not present

## 2016-03-01 DIAGNOSIS — H2513 Age-related nuclear cataract, bilateral: Secondary | ICD-10-CM | POA: Diagnosis not present

## 2016-03-01 DIAGNOSIS — H40033 Anatomical narrow angle, bilateral: Secondary | ICD-10-CM | POA: Diagnosis not present

## 2016-03-01 DIAGNOSIS — E1165 Type 2 diabetes mellitus with hyperglycemia: Secondary | ICD-10-CM | POA: Diagnosis not present

## 2016-03-29 DIAGNOSIS — G44219 Episodic tension-type headache, not intractable: Secondary | ICD-10-CM | POA: Diagnosis not present

## 2016-03-31 DIAGNOSIS — Z0000 Encounter for general adult medical examination without abnormal findings: Secondary | ICD-10-CM | POA: Diagnosis not present

## 2016-03-31 DIAGNOSIS — E119 Type 2 diabetes mellitus without complications: Secondary | ICD-10-CM | POA: Diagnosis not present

## 2016-03-31 DIAGNOSIS — Z681 Body mass index (BMI) 19 or less, adult: Secondary | ICD-10-CM | POA: Diagnosis not present

## 2016-03-31 DIAGNOSIS — N410 Acute prostatitis: Secondary | ICD-10-CM | POA: Diagnosis not present

## 2016-04-01 DIAGNOSIS — Z125 Encounter for screening for malignant neoplasm of prostate: Secondary | ICD-10-CM | POA: Diagnosis not present

## 2016-04-01 DIAGNOSIS — N390 Urinary tract infection, site not specified: Secondary | ICD-10-CM | POA: Diagnosis not present

## 2016-04-01 DIAGNOSIS — Z681 Body mass index (BMI) 19 or less, adult: Secondary | ICD-10-CM | POA: Diagnosis not present

## 2016-04-01 DIAGNOSIS — Z1389 Encounter for screening for other disorder: Secondary | ICD-10-CM | POA: Diagnosis not present

## 2016-04-01 DIAGNOSIS — Z0000 Encounter for general adult medical examination without abnormal findings: Secondary | ICD-10-CM | POA: Diagnosis not present

## 2016-04-01 DIAGNOSIS — N410 Acute prostatitis: Secondary | ICD-10-CM | POA: Diagnosis not present

## 2016-04-01 DIAGNOSIS — E039 Hypothyroidism, unspecified: Secondary | ICD-10-CM | POA: Diagnosis not present

## 2016-04-01 DIAGNOSIS — R5383 Other fatigue: Secondary | ICD-10-CM | POA: Diagnosis not present

## 2016-04-20 ENCOUNTER — Encounter: Payer: Self-pay | Admitting: *Deleted

## 2016-04-20 ENCOUNTER — Other Ambulatory Visit: Payer: Self-pay | Admitting: Surgery

## 2016-04-20 DIAGNOSIS — E1140 Type 2 diabetes mellitus with diabetic neuropathy, unspecified: Secondary | ICD-10-CM

## 2016-04-20 NOTE — Progress Notes (Signed)
  Canden, [place] Preferred Name:  None Male, 75 y.o., 1941-04-21 Last Weight:  211 lb (95.7 kg) Weight:  211 lb (95.7 kg) Phone:  [phone] PCP:  Cory Munch, PA-C Language:  English Need Interpreter:  None Allergies:  Iohexol, Penicillins, Erythromycin Health Maintenance Due?:  Health Maintenance Active FYIs:  General Primary Ins.:  MEDICARE MRN:  [id number] MyChart:  Code expired Next Appt:  None You completed this message on 04/20/2016. The information that appears might not be up to date.  You completed this message for [facility] Rx Refill Pool.    Rx Auth Request  Received: Today  Message Futures trader, Surescripts Out sent to [facility] Rx Refill Pool  Caller: Unspecified (Today, 1:42 PM)      Previous Messages        Refused Medications  GABAPENTIN 300MG  CAPSULE In chart as: gabapentin (NEURONTIN) 300 MG capsule TAKE 2 CAPSULES IN THE MORNING & 3 CAPSULES IN THE EVENING.     Disp: 180 capsule Refills: 0   Class: Normal Start: 04/20/2016  For: Diabetic neuropathy, painful ([place]) Refused by: Mena Goes, RN Refusal reason: Patient needs an appointment (No showed for 01-25-16 appt with Dr. Trula Slade) [place] requested from: [place], [place]: [id number] Medication Refill   Interface, [place] routed conversation to [facility] Rx Refill Pool 1 hour ago (1:42 PM)       Denied due to patient being a no show for last appt in May 2017, scheduled to see Dr. Trula Slade, Needs appt prior to getting Neurontin refilled

## 2016-04-28 DIAGNOSIS — M25511 Pain in right shoulder: Secondary | ICD-10-CM | POA: Diagnosis not present

## 2016-04-28 DIAGNOSIS — F331 Major depressive disorder, recurrent, moderate: Secondary | ICD-10-CM | POA: Diagnosis not present

## 2016-04-28 DIAGNOSIS — E1165 Type 2 diabetes mellitus with hyperglycemia: Secondary | ICD-10-CM | POA: Diagnosis not present

## 2016-04-28 DIAGNOSIS — M810 Age-related osteoporosis without current pathological fracture: Secondary | ICD-10-CM | POA: Diagnosis not present

## 2016-04-28 DIAGNOSIS — Z1389 Encounter for screening for other disorder: Secondary | ICD-10-CM | POA: Diagnosis not present

## 2016-04-28 DIAGNOSIS — Z681 Body mass index (BMI) 19 or less, adult: Secondary | ICD-10-CM | POA: Diagnosis not present

## 2016-05-02 DIAGNOSIS — E538 Deficiency of other specified B group vitamins: Secondary | ICD-10-CM | POA: Diagnosis not present

## 2016-05-02 DIAGNOSIS — Z1389 Encounter for screening for other disorder: Secondary | ICD-10-CM | POA: Diagnosis not present

## 2016-05-02 DIAGNOSIS — E559 Vitamin D deficiency, unspecified: Secondary | ICD-10-CM | POA: Diagnosis not present

## 2016-05-02 DIAGNOSIS — Z681 Body mass index (BMI) 19 or less, adult: Secondary | ICD-10-CM | POA: Diagnosis not present

## 2016-05-02 DIAGNOSIS — R5383 Other fatigue: Secondary | ICD-10-CM | POA: Diagnosis not present

## 2016-05-13 DIAGNOSIS — R5383 Other fatigue: Secondary | ICD-10-CM | POA: Diagnosis not present

## 2016-05-13 DIAGNOSIS — Z719 Counseling, unspecified: Secondary | ICD-10-CM | POA: Diagnosis not present

## 2016-06-01 DIAGNOSIS — I509 Heart failure, unspecified: Secondary | ICD-10-CM | POA: Diagnosis not present

## 2016-06-01 DIAGNOSIS — I7092 Chronic total occlusion of artery of the extremities: Secondary | ICD-10-CM | POA: Diagnosis not present

## 2016-06-01 DIAGNOSIS — R300 Dysuria: Secondary | ICD-10-CM | POA: Diagnosis not present

## 2016-06-01 DIAGNOSIS — I739 Peripheral vascular disease, unspecified: Secondary | ICD-10-CM | POA: Diagnosis not present

## 2016-06-01 DIAGNOSIS — Z125 Encounter for screening for malignant neoplasm of prostate: Secondary | ICD-10-CM | POA: Diagnosis not present

## 2016-06-01 DIAGNOSIS — R110 Nausea: Secondary | ICD-10-CM | POA: Diagnosis not present

## 2016-06-01 DIAGNOSIS — I10 Essential (primary) hypertension: Secondary | ICD-10-CM | POA: Diagnosis not present

## 2016-06-01 DIAGNOSIS — E1151 Type 2 diabetes mellitus with diabetic peripheral angiopathy without gangrene: Secondary | ICD-10-CM | POA: Diagnosis not present

## 2016-06-05 ENCOUNTER — Emergency Department (HOSPITAL_COMMUNITY): Payer: Medicare Other

## 2016-06-05 ENCOUNTER — Encounter (HOSPITAL_COMMUNITY): Payer: Self-pay | Admitting: *Deleted

## 2016-06-05 ENCOUNTER — Emergency Department (HOSPITAL_COMMUNITY)
Admission: EM | Admit: 2016-06-05 | Discharge: 2016-06-05 | Disposition: A | Discharge status: Home / Self Care | Payer: Medicare Other | Attending: Emergency Medicine | Admitting: Emergency Medicine

## 2016-06-05 DIAGNOSIS — K439 Ventral hernia without obstruction or gangrene: Secondary | ICD-10-CM | POA: Insufficient documentation

## 2016-06-05 DIAGNOSIS — R739 Hyperglycemia, unspecified: Secondary | ICD-10-CM | POA: Diagnosis not present

## 2016-06-05 DIAGNOSIS — J441 Chronic obstructive pulmonary disease with (acute) exacerbation: Secondary | ICD-10-CM | POA: Insufficient documentation

## 2016-06-05 DIAGNOSIS — Z79899 Other long term (current) drug therapy: Secondary | ICD-10-CM | POA: Insufficient documentation

## 2016-06-05 DIAGNOSIS — I2510 Atherosclerotic heart disease of native coronary artery without angina pectoris: Secondary | ICD-10-CM | POA: Diagnosis not present

## 2016-06-05 DIAGNOSIS — Z8554 Personal history of malignant neoplasm of ureter: Secondary | ICD-10-CM | POA: Diagnosis not present

## 2016-06-05 DIAGNOSIS — J449 Chronic obstructive pulmonary disease, unspecified: Secondary | ICD-10-CM | POA: Diagnosis not present

## 2016-06-05 DIAGNOSIS — Z7984 Long term (current) use of oral hypoglycemic drugs: Secondary | ICD-10-CM | POA: Insufficient documentation

## 2016-06-05 DIAGNOSIS — E119 Type 2 diabetes mellitus without complications: Secondary | ICD-10-CM | POA: Diagnosis not present

## 2016-06-05 DIAGNOSIS — J45909 Unspecified asthma, uncomplicated: Secondary | ICD-10-CM | POA: Insufficient documentation

## 2016-06-05 DIAGNOSIS — Z7982 Long term (current) use of aspirin: Secondary | ICD-10-CM | POA: Insufficient documentation

## 2016-06-05 DIAGNOSIS — R0602 Shortness of breath: Secondary | ICD-10-CM | POA: Diagnosis present

## 2016-06-05 DIAGNOSIS — E1065 Type 1 diabetes mellitus with hyperglycemia: Secondary | ICD-10-CM | POA: Diagnosis not present

## 2016-06-05 DIAGNOSIS — R0682 Tachypnea, not elsewhere classified: Secondary | ICD-10-CM | POA: Diagnosis not present

## 2016-06-05 DIAGNOSIS — F17210 Nicotine dependence, cigarettes, uncomplicated: Secondary | ICD-10-CM | POA: Diagnosis not present

## 2016-06-05 LAB — CBC WITH DIFFERENTIAL/PLATELET
BASOS PCT: 0 %
Basophils Absolute: 0 10*3/uL (ref 0.0–0.1)
EOS ABS: 0.1 10*3/uL (ref 0.0–0.7)
EOS PCT: 1 %
HEMATOCRIT: 43.6 % (ref 39.0–52.0)
Hemoglobin: 14.4 g/dL (ref 13.0–17.0)
Lymphocytes Relative: 38 %
Lymphs Abs: 2.3 10*3/uL (ref 0.7–4.0)
MCH: 31.3 pg (ref 26.0–34.0)
MCHC: 33 g/dL (ref 30.0–36.0)
MCV: 94.8 fL (ref 78.0–100.0)
MONO ABS: 0.5 10*3/uL (ref 0.1–1.0)
MONOS PCT: 9 %
Neutro Abs: 3.1 10*3/uL (ref 1.7–7.7)
Neutrophils Relative %: 52 %
PLATELETS: 197 10*3/uL (ref 150–400)
RBC: 4.6 MIL/uL (ref 4.22–5.81)
RDW: 15.4 % (ref 11.5–15.5)
WBC: 5.9 10*3/uL (ref 4.0–10.5)

## 2016-06-05 LAB — BASIC METABOLIC PANEL
ANION GAP: 10 (ref 5–15)
BUN: 16 mg/dL (ref 6–20)
CALCIUM: 9.1 mg/dL (ref 8.9–10.3)
CHLORIDE: 105 mmol/L (ref 101–111)
CO2: 21 mmol/L — AB (ref 22–32)
Creatinine, Ser: 1.23 mg/dL (ref 0.61–1.24)
GFR calc Af Amer: >60 mL/min (ref >60)
GFR, EST NON AFRICAN AMERICAN: 56 mL/min — AB (ref >60)
GLUCOSE: 87 mg/dL (ref 65–99)
Potassium: 4.5 mmol/L (ref 3.5–5.1)
Sodium: 136 mmol/L (ref 135–145)

## 2016-06-05 MED ORDER — PREDNISONE 50 MG PO TABS
60.0000 mg | ORAL_TABLET | Freq: Once | ORAL | Status: AC
  Administered 2016-06-05: 60 mg via ORAL
  Filled 2016-06-05: qty 1

## 2016-06-05 MED ORDER — IPRATROPIUM-ALBUTEROL 0.5-2.5 (3) MG/3ML IN SOLN
3.0000 mL | Freq: Once | RESPIRATORY_TRACT | Status: AC
  Administered 2016-06-05: 3 mL via RESPIRATORY_TRACT
  Filled 2016-06-05: qty 3

## 2016-06-05 MED ORDER — PREDNISONE 50 MG PO TABS: ORAL_TABLET | ORAL | 0 refills | Status: DC

## 2016-06-05 MED ORDER — ALBUTEROL SULFATE (2.5 MG/3ML) 0.083% IN NEBU
2.5000 mg | INHALATION_SOLUTION | Freq: Once | RESPIRATORY_TRACT | Status: AC
  Administered 2016-06-05: 2.5 mg via RESPIRATORY_TRACT
  Filled 2016-06-05: qty 3

## 2016-06-05 NOTE — ED Provider Notes (Signed)
[facility] DEPT Provider Note   CSN: [id number] Arrival date & time:    By signing my name below, I, Georgette Shell, attest that this documentation has been prepared under the direction and in the presence of Ezequiel Essex, MD. Electronically Signed: Georgette Shell, ED Scribe. 06/06/16. 12:18 AM.  History   Chief Complaint Chief Complaint  Patient presents with  . Hyperglycemia   HPI Comments: [name] is a 75 y.o. male with h/o COPD and DM who presents to the Emergency Department by EMS complaining of elevated blood sugar onset ~ 9 pm one day ago. Pt states he went to check his blood sugar and it was 390, he notes it usually falls around 170. Pt reports a normal appetite, but he notes he had two slices of apple pie for dinner earlier. Pt was given 2.5 mg Albuterol en route with significant improvement. He took Toujeo insulin around 9 pm with mild relief - this was recently prescribed by his PCP. Pt was here one day ago for shortness of breath and was given Prednisone which relieved his symptoms but pt notes he feels as if his breathing is still labored. He had a CXR done during this previous visit which was unremarkable. He has been compliant with his medications. Normal bowel movement. Pt denies fever, chest pain, or any other associated symptoms. He denies any acute pain at this time.  The history is provided by the patient and the EMS personnel. No language interpreter was used.    Past Medical History:  Diagnosis Date  . Anginal pain ([place])   . Arthritis    "qwhere" (07/09/2013)  . Asthma   . COPD (chronic obstructive pulmonary disease) ([place])    pt denies this hx on 07/09/2013  . Coronary artery disease   . Gout   . H/O hiatal hernia 1997  . Heart murmur    "had rheumatic fever as a kid" (07/09/2013)  . High cholesterol   . Hypertension   . Myocardial infarction 03/2002; ~ 2011  . Pneumonia    "once" (07/09/2013)  . Renal disorder    "only have 1 kidney; due to go back soon  to check the other one" (07/09/2013)  . Rheumatic fever   . RUPTURE ROTATOR CUFF 12/03/2009   Qualifier: Diagnosis of  By: Aline Brochure MD, Dorothyann Peng    . Shortness of breath    "can happen at anytime" (07/09/2013)  . Type II diabetes mellitus ([place])   . 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 Hyperkalemia 11/24/2015  . UTI (lower urinary tract infection) 11/24/2015  . Acute UTI 09/11/2014  . Acute respiratory failure with hypercapnia ([place]) 09/10/2014  . Acute encephalopathy 09/10/2014  . Alcoholic dementia ([place]) [id number]  . Recurrent falls 09/10/2014  . Left shoulder pain 09/10/2014  . CAD in native artery 09/10/2014  . Diabetes mellitus with peripheral vascular disease ([place]) 09/10/2014  . FTT (failure to thrive) in adult 09/10/2014  . Protein-calorie malnutrition ([place]) 09/10/2014  . Mixed acid base balance disorder 09/09/2014  . Acute kidney injury ([place]) 09/09/2014  . Hypotension 09/09/2014  . Fall 08/27/2014  . Altered mental status 08/26/2014  . Dehydration 08/26/2014  . Alcohol abuse 08/26/2014  . Hypertension 08/26/2014  . Aftercare following surgery of the circulatory system, [place] 08/05/2013  . Leg pain 07/01/2013  . Peripheral vascular disease ([place]) 05/27/2013  . Peripheral angiopathy in  diseases classified elsewhere ([place]) 05/02/2012  . Diabetes ([place]) 05/02/2012  . Pain in limb 05/02/2012  . Chronic total occlusion of artery of the extremities ([place]) 05/02/2012  . JOINT EFFUSION, KNEE 12/03/2009  . RUPTURE ROTATOR CUFF 12/03/2009  . ISCHEMIC CARDIOMYOPATHY 08/24/2009  . CHF 08/24/2009  . BACK PAIN, LUMBAR, CHRONIC 08/24/2009    Past Surgical History:  Procedure Laterality Date  . ABOVE KNEE LEG AMPUTATION Left 09/2009  . BELOW KNEE LEG AMPUTATION Left 08/2009  . CARDIAC CATHETERIZATION  ~ 2010   "before the bypass" (07/09/2013)  . CARPAL TUNNEL RELEASE Bilateral  ~2004; ~ 2011  . CHOLECYSTECTOMY  1980's  . CORONARY ARTERY BYPASS GRAFT  ~ 2010   "CABG X5" (07/09/2013)  . EYE SURGERY    . FEMORAL ARTERY STENT Right 07/09/2013  . GLAUCOMA SURGERY Bilateral ?[id number]  . LOWER EXTREMITY ANGIOGRAM Right Nov. 4, 2014  . LOWER EXTREMITY ANGIOGRAM Right 07/09/2013   Procedure: LOWER EXTREMITY ANGIOGRAM;  Surgeon: Serafina Mitchell, MD;  Location[facility] CATH LAB;  Service: Cardiovascular;  Laterality: Right;  . NEPHRECTOMY Left 12/19/2001  . PERIPHERAL VASCULAR CATHETERIZATION N/A 02/03/2015   Procedure: Abdominal Aortogram;  Surgeon: Serafina Mitchell, MD;  Location: [place] [facility] CV LAB;  Service: Cardiovascular;  Laterality: N/A;  . TRIGGER FINGER RELEASE Left ~ 1990's       Home Medications    Prior to Admission medications   Medication Sig Start Date End Date Taking? Authorizing Provider  Insulin Glargine (TOUJEO SOLOSTAR [place]) Inject 10 Units into the skin daily.   Yes Historical Provider, MD  albuterol (PROAIR HFA) 108 (90 Base) MCG/ACT inhaler Inhale 2 puffs into the lungs every 12 (twelve) hours. And then every 4 hours as needed. 11/25/15   Kathie Dike, MD  aspirin EC 81 MG EC tablet Take 1 tablet (81 mg total) by mouth daily. 07/10/13   Nancy Nordmann Roczniak, PA-C  cholecalciferol (VITAMIN D) 400 units TABS tablet Take 1,200 Units by mouth 2 (two) times daily.    Historical Provider, MD  FLUoxetine (PROZAC) 10 MG capsule Take 10 mg by mouth daily.    Historical Provider, MD  gabapentin (NEURONTIN) 300 MG capsule Take 2 capsules, po, in the morning and 3 capsules, po, in the evening Patient taking differently: Take 600-900 mg by mouth 2 (two) times daily. Take 2 capsules, po, in the morning and 3 capsules, po, in the evening 11/24/15   Serafina Mitchell, MD  glimepiride (AMARYL) 4 MG tablet Take 4 mg by mouth daily with breakfast.    Historical Provider, MD  metFORMIN (GLUCOPHAGE) 500 MG tablet Take 500 mg by mouth 2 (two) times daily with a meal.    Historical  Provider, MD  pioglitazone (ACTOS) 30 MG tablet Take 30 mg by mouth daily.    Historical Provider, MD  predniSONE (DELTASONE) 50 MG tablet One tablet PO daily for 4 days 06/05/16   Ripley Fraise, MD  ramipril (ALTACE) 10 MG capsule Take 10 mg by mouth daily.    Historical Provider, MD  senna-docusate (SENOKOT-S) 8.6-50 MG per tablet Take 1 tablet by mouth at bedtime. Patient taking differently: Take 1 tablet by mouth 2 (two) times daily.  09/15/14   Rexene Alberts, MD  Tamsulosin HCl (FLOMAX) 0.4 MG CAPS Take 0.4 mg by mouth 2 (two) times daily.     Historical Provider, MD  zolpidem (AMBIEN) 10 MG tablet Take 20 mg by mouth at bedtime.    Historical Provider, MD    Family History Family  History  Problem Relation Age of Onset  . Cancer Mother   . Heart attack Father     Social History Social History  Substance Use Topics  . Smoking status: Current Every Day Smoker    Packs/day: 1.00    Years: 54.00    Types: Cigarettes  . Smokeless tobacco: Never Used  . Alcohol use 0.0 oz/week     Comment: social drinker     Allergies   Iohexol; Penicillins; and Erythromycin   Review of Systems Review of Systems 10 Systems reviewed and all are negative for acute change except as noted in the HPI. Physical Exam Updated Vital Signs BP 146/87   Pulse 98   Temp 97.7 F (36.5 C) (Oral)   Resp 20   Ht 6\' 3"  (1.905 m)   Wt 207 lb (93.9 kg)   SpO2 95%   BMI 25.87 kg/m   Physical Exam  Constitutional: He is oriented to person, place, and time. He appears well-developed and well-nourished. No distress.  Pt speaking in full sentences.   HENT:  Head: Normocephalic and atraumatic.  Mouth/Throat: Oropharynx is clear and moist. No oropharyngeal exudate.  Eyes: Conjunctivae and EOM are normal. Pupils are equal, round, and reactive to light.  Neck: Normal range of motion. Neck supple.  No meningismus.  Cardiovascular: Normal rate, regular rhythm, normal heart sounds and intact distal pulses.    No murmur heard. Pulmonary/Chest: Effort normal. No respiratory distress. He has wheezes.  Expiratory wheezing bilaterally.  Abdominal: Soft. There is no tenderness. There is no rebound and no guarding. A hernia is present.  Reducible ventral hernia.  Musculoskeletal: Normal range of motion. He exhibits no edema or tenderness.  Left AKA.  Neurological: He is alert and oriented to person, place, and time. No cranial nerve deficit. He exhibits normal muscle tone. Coordination normal.  No ataxia on finger to nose bilaterally. No pronator drift. 5/5 strength throughout. CN 2-12 intact.Equal grip strength. Sensation intact.   Skin: Skin is warm.  Psychiatric: He has a normal mood and affect. His behavior is normal.  Nursing note and vitals reviewed.   ED Treatments / Results  DIAGNOSTIC STUDIES: Oxygen Saturation is 95% on [place], adequate by my interpretation.    COORDINATION OF CARE: 12:13 AM Discussed treatment plan with pt at bedside which includes lab work and pt agreed to plan.  Labs (all labs ordered are listed, but only abnormal results are displayed) Labs Reviewed  CBC WITH DIFFERENTIAL/PLATELET - Abnormal; Notable for the following:       Result Value   Lymphs Abs 0.6 (*)    Monocytes Absolute 0.0 (*)    All other components within normal limits  BASIC METABOLIC PANEL - Abnormal; Notable for the following:    Sodium 130 (*)    Potassium 5.4 (*)    Chloride 100 (*)    CO2 20 (*)    Glucose, Bld 299 (*)    Creatinine, Ser 1.46 (*)    Calcium 8.7 (*)    GFR calc non Af Amer 46 (*)    GFR calc Af Amer 53 (*)    All other components within normal limits  CBG MONITORING, ED - Abnormal; Notable for the following:    Glucose-Capillary 306 (*)    All other components within normal limits  URINALYSIS, ROUTINE W REFLEX MICROSCOPIC (NOT AT [facility])    EKG  EKG Interpretation  Date/Time:  Monday June 06 2016 00:24:42 EDT Ventricular Rate:  97 PR Interval:  QRS  Duration: 120 QT Interval:  381 QTC Calculation: 484 R Axis:   67 Text Interpretation:  Sinus or ectopic atrial rhythm Incomplete left bundle branch block Anterior Q waves, possibly due to ILBBB No significant change was found Confirmed by Wyvonnia Dusky  MD, Khiara Shuping [phone]) on 06/06/2016 12:32:06 AM       Radiology Dg Chest Portable 1 View  Result Date: 06/05/2016 CLINICAL DATA:  Shortness of breath. EXAM: PORTABLE CHEST 1 VIEW COMPARISON:  Radiographs of November 24, 2015. FINDINGS: Stable cardiomediastinal silhouette. Sternotomy wires are noted. No pneumothorax or pleural effusion is noted. No acute pulmonary disease is noted. Bony thorax is unremarkable. IMPRESSION: No acute cardiopulmonary abnormality seen. Electronically Signed   By: Marijo Conception, M.D.   On: 06/05/2016 14:28    Procedures Procedures (including critical care time)  Medications Ordered in ED Medications  sodium chloride 0.9 % bolus 500 mL (not administered)     Initial Impression / Assessment and Plan / ED Course  I have reviewed the triage vital signs and the nursing notes.  Pertinent labs & imaging results that were available during my care of the patient were reviewed by me and considered in my medical decision making (see chart for details).  Clinical Course  Patient from home with hyperglycemia. Seen earlier today for COPD exacerbation and started on prednisone. Took 10 units of Toujeo tonight. Also took his by mouth metformin. Denies fever, denies vomiting. States he also had a piece of pie after dinner.  Sugar 306 on arrival. Patient declines breathing treatment states he is breathing at baseline. IVF given. No chest pain.  Labs show hyperglycemia without DKA. Anion gap 10. Mild hyperkalemia of 5.3. No EKG changes Kayexalate given.  Patient instructed to hold ACE inhibitor and followup with PCP for recheck of potassium and kidney function.  Blood sugar 255. No evidence of DKA.  Patient aware steroids may  increase his blood sugar. He was recently started on toujeo only a few days ago. Needs to call PCP in AM for further adjustment of DM meds. Advised not to take more insulin than recommended.  Return precautions discussed.  Final Clinical Impressions(s) / ED Diagnoses   Final diagnoses:  Hyperglycemia  Hyperkalemia   I personally performed the services described in this documentation, which was scribed in my presence. The recorded information has been reviewed and is accurate.  New Prescriptions New Prescriptions   No medications on file     Ezequiel Essex, MD 06/06/16 [phone]

## 2016-06-05 NOTE — ED Notes (Signed)
Patient requesting for 1 side rail to be down so he can sit up on the side of the bed. Patient assisted to side of bed and advised to call for nursing staff should he need to be repositioned. Pt verbalized understanding of instructions.

## 2016-06-05 NOTE — ED Triage Notes (Addendum)
Pt presents to er for further evaluation of elevated blood sugar and wheezing, pt was given 2.5 mg albuterol tx enroute with improvement in wheezing, cbg with ems 324, pt states that he was seen in er earlier and was placed on prednisone today,

## 2016-06-05 NOTE — ED Triage Notes (Addendum)
Pt reports difficulty breathing x 2 days with decreased appetite.  Pt has hx of COPD.

## 2016-06-05 NOTE — ED Provider Notes (Signed)
[facility] DEPT Provider Note   CSN: [id number] Arrival date & time: 06/05/16  1357     History   Chief Complaint Chief Complaint  Patient presents with  . Respiratory Distress    HPI [name] is a 75 y.o. male.  The history is provided by the patient.  Shortness of Breath  This is a new problem. The average episode lasts 2 days. The problem occurs frequently.The current episode started 2 days ago. The problem has been gradually worsening. Associated symptoms include cough. Pertinent negatives include no fever, no hemoptysis, no chest pain and no vomiting. Associated medical issues include COPD.  Patient with h/o COPD/CAD, previous left nephrectomy due to "cancer of ureter" presents with cough/sob for past 2 days No fever/vomiting No CP It is not improved with home nebs No hemoptysis   He reports cancer is in remission   Past Medical History:  Diagnosis Date  . Anginal pain ([place])   . Arthritis    "qwhere" (07/09/2013)  . Asthma   . COPD (chronic obstructive pulmonary disease) ([place])    pt denies this hx on 07/09/2013  . Coronary artery disease   . Gout   . H/O hiatal hernia 1997  . Heart murmur    "had rheumatic fever as a kid" (07/09/2013)  . High cholesterol   . Hypertension   . Myocardial infarction 03/2002; ~ 2011  . Pneumonia    "once" (07/09/2013)  . Renal disorder    "only have 1 kidney; due to go back soon to check the other one" (07/09/2013)  . Rheumatic fever   . RUPTURE ROTATOR CUFF 12/03/2009   Qualifier: Diagnosis of  By: Aline Brochure MD, Dorothyann Peng    . Shortness of breath    "can happen at anytime" (07/09/2013)  . Type II diabetes mellitus ([place])   . Ureter cancer [facility]) 12/2001   "shut down my kidney & resulted in nephrectomy" (07/09/2013)    Patient Active Problem List   Diagnosis Date Noted  . Hyperkalemia 11/24/2015  . UTI (lower urinary tract infection) 11/24/2015  . Acute UTI 09/11/2014  . Acute respiratory failure with hypercapnia ([place])  09/10/2014  . Acute encephalopathy 09/10/2014  . Alcoholic dementia ([place]) [id number]  . Recurrent falls 09/10/2014  . Left shoulder pain 09/10/2014  . CAD in native artery 09/10/2014  . Diabetes mellitus with peripheral vascular disease ([place]) 09/10/2014  . FTT (failure to thrive) in adult 09/10/2014  . Protein-calorie malnutrition ([place]) 09/10/2014  . Mixed acid base balance disorder 09/09/2014  . Acute kidney injury ([place]) 09/09/2014  . Hypotension 09/09/2014  . Fall 08/27/2014  . Altered mental status 08/26/2014  . Dehydration 08/26/2014  . Alcohol abuse 08/26/2014  . Hypertension 08/26/2014  . Aftercare following surgery of the circulatory system, [place] 08/05/2013  . Leg pain 07/01/2013  . Peripheral vascular disease ([place]) 05/27/2013  . Peripheral angiopathy in diseases classified elsewhere ([place]) 05/02/2012  . Diabetes ([place]) 05/02/2012  . Pain in limb 05/02/2012  . Chronic total occlusion of artery of the extremities ([place]) 05/02/2012  . JOINT EFFUSION, KNEE 12/03/2009  . RUPTURE ROTATOR CUFF 12/03/2009  . ISCHEMIC CARDIOMYOPATHY 08/24/2009  . CHF 08/24/2009  . BACK PAIN, LUMBAR, CHRONIC 08/24/2009    Past Surgical History:  Procedure Laterality Date  . ABOVE KNEE LEG AMPUTATION Left 09/2009  . BELOW KNEE LEG AMPUTATION Left 08/2009  . CARDIAC CATHETERIZATION  ~ 2010   "before the bypass" (07/09/2013)  . CARPAL TUNNEL RELEASE Bilateral ~2004; ~ 2011  . CHOLECYSTECTOMY  1980's  . CORONARY ARTERY BYPASS GRAFT  ~ 2010   "CABG X5" (07/09/2013)  . EYE SURGERY    . FEMORAL ARTERY STENT Right 07/09/2013  . GLAUCOMA SURGERY Bilateral ?[id number]  . LOWER EXTREMITY ANGIOGRAM Right Nov. 4, 2014  . LOWER EXTREMITY ANGIOGRAM Right 07/09/2013   Procedure: LOWER EXTREMITY ANGIOGRAM;  Surgeon: Serafina Mitchell, MD;  Location: [facility] CATH LAB;  Service: Cardiovascular;  Laterality: Right;  . NEPHRECTOMY Left 12/19/2001  . PERIPHERAL VASCULAR CATHETERIZATION N/A 02/03/2015   Procedure: Abdominal  Aortogram;  Surgeon: Serafina Mitchell, MD;  Location: [facility] CV LAB;  Service: Cardiovascular;  Laterality: N/A;  . TRIGGER FINGER RELEASE Left ~ 1990's       Home Medications    Prior to Admission medications   Medication Sig Start Date End Date Taking? Authorizing Provider  albuterol (PROAIR HFA) 108 (90 Base) MCG/ACT inhaler Inhale 2 puffs into the lungs every 12 (twelve) hours. And then every 4 hours as needed. 11/25/15  Yes Kathie Dike, MD  aspirin EC 81 MG EC tablet Take 1 tablet (81 mg total) by mouth daily. 07/10/13  Yes Regina J Roczniak, PA-C  cholecalciferol (VITAMIN D) 400 units TABS tablet Take 1,200 Units by mouth 2 (two) times daily.   Yes Historical Provider, MD  FLUoxetine (PROZAC) 10 MG capsule Take 10 mg by mouth daily.   Yes Historical Provider, MD  gabapentin (NEURONTIN) 300 MG capsule Take 2 capsules, po, in the morning and 3 capsules, po, in the evening Patient taking differently: Take 600-900 mg by mouth 2 (two) times daily. Take 2 capsules, po, in the morning and 3 capsules, po, in the evening 11/24/15  Yes Serafina Mitchell, MD  glimepiride (AMARYL) 4 MG tablet Take 4 mg by mouth daily with breakfast.   Yes Historical Provider, MD  metFORMIN (GLUCOPHAGE) 500 MG tablet Take 500 mg by mouth 2 (two) times daily with a meal.   Yes Historical Provider, MD  pioglitazone (ACTOS) 30 MG tablet Take 30 mg by mouth daily.   Yes Historical Provider, MD  ramipril (ALTACE) 10 MG capsule Take 10 mg by mouth daily.   Yes Historical Provider, MD  senna-docusate (SENOKOT-S) 8.6-50 MG per tablet Take 1 tablet by mouth at bedtime. Patient taking differently: Take 1 tablet by mouth 2 (two) times daily.  09/15/14  Yes Rexene Alberts, MD  Tamsulosin HCl (FLOMAX) 0.4 MG CAPS Take 0.4 mg by mouth 2 (two) times daily.    Yes Historical Provider, MD  zolpidem (AMBIEN) 10 MG tablet Take 20 mg by mouth at bedtime.   Yes Historical Provider, MD    Family History Family History  Problem  Relation Age of Onset  . Cancer Mother   . Heart attack Father     Social History Social History  Substance Use Topics  . Smoking status: Current Every Day Smoker    Packs/day: 1.00    Years: 54.00    Types: Cigarettes  . Smokeless tobacco: Never Used  . Alcohol use 0.0 oz/week     Comment: social drinker     Allergies   Iohexol; Penicillins; and Erythromycin   Review of Systems Review of Systems  Constitutional: Negative for fever.  Respiratory: Positive for cough and shortness of breath. Negative for hemoptysis.   Cardiovascular: Negative for chest pain.  Gastrointestinal: Negative for vomiting.  All other systems reviewed and are negative.    Physical Exam Updated Vital Signs BP 122/90 (BP Location: Right Arm)   Pulse 96  Resp 20   Ht 6\' 3"  (1.905 m)   Wt 93.9 kg   SpO2 95%   BMI 25.87 kg/m   Physical Exam CONSTITUTIONAL: Well developed/well nourished HEAD: Normocephalic/atraumatic EYES: EOMI/PERRL ENMT: Mucous membranes moist NECK: supple no meningeal signs SPINE/BACK:entire spine nontender CV: S1/S2 noted, no murmurs/rubs/gallops noted LUNGS: mildly tachypneic.  Wheezing bilaterally ABDOMEN: soft, nontender, easily reducible abdominal wall hernia NEURO: Pt is awake/alert/appropriate.  No facial droop.   EXTREMITIES: right LE - no edema, pulses intact.  Left LE s/p AKA SKIN: warm, color normal PSYCH: no abnormalities of mood noted, alert and oriented to situation   ED Treatments / Results  Labs (all labs ordered are listed, but only abnormal results are displayed) Labs Reviewed  BASIC METABOLIC PANEL - Abnormal; Notable for the following:       Result Value   CO2 21 (*)    GFR calc non Af Amer 56 (*)    All other components within normal limits  CBC WITH DIFFERENTIAL/PLATELET    EKG  EKG Interpretation  Date/Time:  Sunday June 05 2016 14:00:04 EDT Ventricular Rate:  92 PR Interval:    QRS Duration: 113 QT Interval:  391 QTC  Calculation: 484 R Axis:   81 Text Interpretation:  Sinus rhythm Prolonged PR interval Low voltage, precordial leads Probable anteroseptal infarct, old Baseline wander in lead(s) I aVR aVL No significant change since last tracing Confirmed by Christy Gentles  MD, [place] ([id number]) on 06/05/2016 2:14:51 PM       Radiology Dg Chest Portable 1 View  Result Date: 06/05/2016 CLINICAL DATA:  Shortness of breath. EXAM: PORTABLE CHEST 1 VIEW COMPARISON:  Radiographs of November 24, 2015. FINDINGS: Stable cardiomediastinal silhouette. Sternotomy wires are noted. No pneumothorax or pleural effusion is noted. No acute pulmonary disease is noted. Bony thorax is unremarkable. IMPRESSION: No acute cardiopulmonary abnormality seen. Electronically Signed   By: Marijo Conception, M.D.   On: 06/05/2016 14:28    Procedures Procedures (including critical care time)  Medications Ordered in ED Medications  ipratropium-albuterol (DUONEB) 0.5-2.5 (3) MG/3ML nebulizer solution 3 mL (3 mLs Nebulization Given 06/05/16 1429)  albuterol (PROVENTIL) (2.5 MG/3ML) 0.083% nebulizer solution 2.5 mg (2.5 mg Nebulization Given 06/05/16 1427)  predniSONE (DELTASONE) tablet 60 mg (60 mg Oral Given 06/05/16 1447)     Initial Impression / Assessment and Plan / ED Course  I have reviewed the triage vital signs and the nursing notes.  Pertinent labs & imaging results that were available during my care of the patient were reviewed by me and considered in my medical decision making (see chart for details).  Clinical Course    2:26 PM Plan to treat as COPD and reassess Pt agreeable 4:02 PM Pt improved Wheezing resolved No distress No hypoxia Pt is requesting d/c home Suspect COPD exacerbation Prednisone for 4 more days Discussed return precautions Also - he requests referral for surgery due to persistent reducible abdominal hernia  Final Clinical Impressions(s) / ED Diagnoses   Final diagnoses:  COPD exacerbation (HCC)  Abdominal  wall hernia    New Prescriptions New Prescriptions   PREDNISONE (DELTASONE) 50 MG TABLET    One tablet PO daily for 4 days     Ripley Fraise, MD 06/05/16 1603

## 2016-06-06 ENCOUNTER — Emergency Department (HOSPITAL_COMMUNITY)
Admission: EM | Admit: 2016-06-06 | Discharge: 2016-06-06 | Disposition: A | Discharge status: Home / Self Care | Payer: Medicare Other | Attending: Emergency Medicine | Admitting: Emergency Medicine

## 2016-06-06 DIAGNOSIS — E875 Hyperkalemia: Secondary | ICD-10-CM | POA: Diagnosis not present

## 2016-06-06 DIAGNOSIS — E1165 Type 2 diabetes mellitus with hyperglycemia: Secondary | ICD-10-CM | POA: Insufficient documentation

## 2016-06-06 DIAGNOSIS — J45909 Unspecified asthma, uncomplicated: Secondary | ICD-10-CM | POA: Insufficient documentation

## 2016-06-06 DIAGNOSIS — Z79899 Other long term (current) drug therapy: Secondary | ICD-10-CM | POA: Diagnosis not present

## 2016-06-06 DIAGNOSIS — Z7984 Long term (current) use of oral hypoglycemic drugs: Secondary | ICD-10-CM | POA: Diagnosis not present

## 2016-06-06 DIAGNOSIS — Z794 Long term (current) use of insulin: Secondary | ICD-10-CM | POA: Insufficient documentation

## 2016-06-06 DIAGNOSIS — J449 Chronic obstructive pulmonary disease, unspecified: Secondary | ICD-10-CM | POA: Insufficient documentation

## 2016-06-06 DIAGNOSIS — I2510 Atherosclerotic heart disease of native coronary artery without angina pectoris: Secondary | ICD-10-CM | POA: Insufficient documentation

## 2016-06-06 DIAGNOSIS — F17210 Nicotine dependence, cigarettes, uncomplicated: Secondary | ICD-10-CM | POA: Insufficient documentation

## 2016-06-06 DIAGNOSIS — Z7982 Long term (current) use of aspirin: Secondary | ICD-10-CM | POA: Diagnosis not present

## 2016-06-06 DIAGNOSIS — I10 Essential (primary) hypertension: Secondary | ICD-10-CM | POA: Insufficient documentation

## 2016-06-06 DIAGNOSIS — Z8554 Personal history of malignant neoplasm of ureter: Secondary | ICD-10-CM | POA: Diagnosis not present

## 2016-06-06 DIAGNOSIS — R739 Hyperglycemia, unspecified: Secondary | ICD-10-CM

## 2016-06-06 LAB — CBC WITH DIFFERENTIAL/PLATELET
BASOS PCT: 0 %
Basophils Absolute: 0 10*3/uL (ref 0.0–0.1)
Eosinophils Absolute: 0 10*3/uL (ref 0.0–0.7)
Eosinophils Relative: 0 %
HEMATOCRIT: 43.3 % (ref 39.0–52.0)
HEMOGLOBIN: 14.4 g/dL (ref 13.0–17.0)
LYMPHS ABS: 0.6 10*3/uL — AB (ref 0.7–4.0)
LYMPHS PCT: 10 %
MCH: 31.3 pg (ref 26.0–34.0)
MCHC: 33.3 g/dL (ref 30.0–36.0)
MCV: 94.1 fL (ref 78.0–100.0)
MONO ABS: 0 10*3/uL — AB (ref 0.1–1.0)
MONOS PCT: 1 %
Neutro Abs: 5.2 10*3/uL (ref 1.7–7.7)
Neutrophils Relative %: 89 %
Platelets: 191 10*3/uL (ref 150–400)
RBC: 4.6 MIL/uL (ref 4.22–5.81)
RDW: 15.4 % (ref 11.5–15.5)
WBC: 5.8 10*3/uL (ref 4.0–10.5)

## 2016-06-06 LAB — BASIC METABOLIC PANEL
ANION GAP: 10 (ref 5–15)
BUN: 20 mg/dL (ref 6–20)
CALCIUM: 8.7 mg/dL — AB (ref 8.9–10.3)
CHLORIDE: 100 mmol/L — AB (ref 101–111)
CO2: 20 mmol/L — AB (ref 22–32)
Creatinine, Ser: 1.46 mg/dL — ABNORMAL HIGH (ref 0.61–1.24)
GFR calc Af Amer: 53 mL/min — ABNORMAL LOW (ref >60)
GFR calc non Af Amer: 46 mL/min — ABNORMAL LOW (ref >60)
GLUCOSE: 299 mg/dL — AB (ref 65–99)
Potassium: 5.4 mmol/L — ABNORMAL HIGH (ref 3.5–5.1)
Sodium: 130 mmol/L — ABNORMAL LOW (ref 135–145)

## 2016-06-06 LAB — CBG MONITORING, ED
GLUCOSE-CAPILLARY: 255 mg/dL — AB (ref 65–99)
GLUCOSE-CAPILLARY: 219 mg/dL — AB (ref 65–99)
Glucose-Capillary: 306 mg/dL — ABNORMAL HIGH (ref 65–99)

## 2016-06-06 LAB — POTASSIUM: Potassium: 5.3 mmol/L — ABNORMAL HIGH (ref 3.5–5.1)

## 2016-06-06 MED ORDER — SODIUM CHLORIDE 0.9 % IV BOLUS (SEPSIS)
500.0000 mL | Freq: Once | INTRAVENOUS | Status: AC
  Administered 2016-06-06: 500 mL via INTRAVENOUS

## 2016-06-06 MED ORDER — IPRATROPIUM-ALBUTEROL 0.5-2.5 (3) MG/3ML IN SOLN
3.0000 mL | Freq: Once | RESPIRATORY_TRACT | Status: DC
  Filled 2016-06-06: qty 3

## 2016-06-06 MED ORDER — SODIUM POLYSTYRENE SULFONATE 15 GM/60ML PO SUSP
30.0000 g | Freq: Once | ORAL | Status: AC
  Administered 2016-06-06: 30 g via ORAL
  Filled 2016-06-06: qty 120

## 2016-06-06 MED ORDER — SODIUM BICARBONATE 8.4 % IV SOLN
50.0000 meq | Freq: Once | INTRAVENOUS | Status: AC
  Administered 2016-06-06: 50 meq via INTRAVENOUS
  Filled 2016-06-06: qty 50

## 2016-06-06 NOTE — ED Notes (Signed)
Pt also states that he took 10 units of [employer], tonight, at [employer],

## 2016-06-06 NOTE — ED Notes (Signed)
Pt left at this time, stated he did not have time to wait for paperwork

## 2016-06-06 NOTE — Discharge Instructions (Signed)
Monitor your blood sugars closely while on the steroids. Stop taking ramipril for now and followup with your doctor to recheck your kidney function and potassium. Return to the ED if you develop chest pain, difficulty breathing, or any other concerns.

## 2016-06-07 DIAGNOSIS — Z23 Encounter for immunization: Secondary | ICD-10-CM | POA: Diagnosis not present

## 2016-06-08 DIAGNOSIS — H04123 Dry eye syndrome of bilateral lacrimal glands: Secondary | ICD-10-CM | POA: Diagnosis not present

## 2016-06-16 ENCOUNTER — Emergency Department (HOSPITAL_COMMUNITY): Payer: Medicare Other

## 2016-06-16 ENCOUNTER — Inpatient Hospital Stay (HOSPITAL_COMMUNITY)
Admission: EM | Admit: 2016-06-16 | Discharge: 2016-06-17 | DRG: 190 | Discharge status: Against Medical Advice | Payer: Medicare Other | Attending: Internal Medicine | Admitting: Internal Medicine

## 2016-06-16 ENCOUNTER — Encounter (HOSPITAL_COMMUNITY): Payer: Self-pay | Admitting: Emergency Medicine

## 2016-06-16 DIAGNOSIS — Z8249 Family history of ischemic heart disease and other diseases of the circulatory system: Secondary | ICD-10-CM

## 2016-06-16 DIAGNOSIS — R0682 Tachypnea, not elsewhere classified: Secondary | ICD-10-CM | POA: Diagnosis not present

## 2016-06-16 DIAGNOSIS — Z9049 Acquired absence of other specified parts of digestive tract: Secondary | ICD-10-CM | POA: Diagnosis not present

## 2016-06-16 DIAGNOSIS — Z7982 Long term (current) use of aspirin: Secondary | ICD-10-CM | POA: Diagnosis not present

## 2016-06-16 DIAGNOSIS — N179 Acute kidney failure, unspecified: Secondary | ICD-10-CM | POA: Diagnosis present

## 2016-06-16 DIAGNOSIS — I10 Essential (primary) hypertension: Secondary | ICD-10-CM | POA: Diagnosis present

## 2016-06-16 DIAGNOSIS — E7800 Pure hypercholesterolemia, unspecified: Secondary | ICD-10-CM | POA: Diagnosis present

## 2016-06-16 DIAGNOSIS — I252 Old myocardial infarction: Secondary | ICD-10-CM | POA: Diagnosis not present

## 2016-06-16 DIAGNOSIS — Z794 Long term (current) use of insulin: Secondary | ICD-10-CM | POA: Diagnosis not present

## 2016-06-16 DIAGNOSIS — Z881 Allergy status to other antibiotic agents status: Secondary | ICD-10-CM | POA: Diagnosis not present

## 2016-06-16 DIAGNOSIS — G92 Toxic encephalopathy: Secondary | ICD-10-CM | POA: Diagnosis present

## 2016-06-16 DIAGNOSIS — R0902 Hypoxemia: Secondary | ICD-10-CM | POA: Diagnosis present

## 2016-06-16 DIAGNOSIS — F17210 Nicotine dependence, cigarettes, uncomplicated: Secondary | ICD-10-CM | POA: Diagnosis present

## 2016-06-16 DIAGNOSIS — Z89612 Acquired absence of left leg above knee: Secondary | ICD-10-CM | POA: Diagnosis not present

## 2016-06-16 DIAGNOSIS — Z880 Allergy status to penicillin: Secondary | ICD-10-CM

## 2016-06-16 DIAGNOSIS — R0602 Shortness of breath: Secondary | ICD-10-CM | POA: Diagnosis not present

## 2016-06-16 DIAGNOSIS — R930 Abnormal findings on diagnostic imaging of skull and head, not elsewhere classified: Secondary | ICD-10-CM | POA: Diagnosis not present

## 2016-06-16 DIAGNOSIS — M109 Gout, unspecified: Secondary | ICD-10-CM | POA: Diagnosis present

## 2016-06-16 DIAGNOSIS — R062 Wheezing: Secondary | ICD-10-CM | POA: Diagnosis not present

## 2016-06-16 DIAGNOSIS — Z91041 Radiographic dye allergy status: Secondary | ICD-10-CM

## 2016-06-16 DIAGNOSIS — E875 Hyperkalemia: Secondary | ICD-10-CM | POA: Diagnosis present

## 2016-06-16 DIAGNOSIS — G9340 Encephalopathy, unspecified: Secondary | ICD-10-CM | POA: Diagnosis not present

## 2016-06-16 DIAGNOSIS — Z79899 Other long term (current) drug therapy: Secondary | ICD-10-CM

## 2016-06-16 DIAGNOSIS — E1151 Type 2 diabetes mellitus with diabetic peripheral angiopathy without gangrene: Secondary | ICD-10-CM | POA: Diagnosis present

## 2016-06-16 DIAGNOSIS — Z951 Presence of aortocoronary bypass graft: Secondary | ICD-10-CM | POA: Diagnosis not present

## 2016-06-16 DIAGNOSIS — R404 Transient alteration of awareness: Secondary | ICD-10-CM | POA: Diagnosis not present

## 2016-06-16 DIAGNOSIS — J441 Chronic obstructive pulmonary disease with (acute) exacerbation: Secondary | ICD-10-CM | POA: Diagnosis not present

## 2016-06-16 DIAGNOSIS — I2510 Atherosclerotic heart disease of native coronary artery without angina pectoris: Secondary | ICD-10-CM | POA: Diagnosis present

## 2016-06-16 DIAGNOSIS — T380X5A Adverse effect of glucocorticoids and synthetic analogues, initial encounter: Secondary | ICD-10-CM | POA: Diagnosis present

## 2016-06-16 LAB — CBC WITH DIFFERENTIAL/PLATELET
Basophils Absolute: 0 10*3/uL (ref 0.0–0.1)
Basophils Relative: 0 %
Eosinophils Absolute: 0 10*3/uL (ref 0.0–0.7)
Eosinophils Relative: 0 %
HEMATOCRIT: 45.1 % (ref 39.0–52.0)
HEMOGLOBIN: 14.4 g/dL (ref 13.0–17.0)
LYMPHS ABS: 0.8 10*3/uL (ref 0.7–4.0)
LYMPHS PCT: 10 %
MCH: 31.4 pg (ref 26.0–34.0)
MCHC: 31.9 g/dL (ref 30.0–36.0)
MCV: 98.3 fL (ref 78.0–100.0)
MONOS PCT: 3 %
Monocytes Absolute: 0.3 10*3/uL (ref 0.1–1.0)
NEUTROS PCT: 87 %
Neutro Abs: 7.4 10*3/uL (ref 1.7–7.7)
Platelets: 171 10*3/uL (ref 150–400)
RBC: 4.59 MIL/uL (ref 4.22–5.81)
RDW: 15.7 % — ABNORMAL HIGH (ref 11.5–15.5)
WBC: 8.5 10*3/uL (ref 4.0–10.5)

## 2016-06-16 LAB — URINE MICROSCOPIC-ADD ON

## 2016-06-16 LAB — BLOOD GAS, ARTERIAL
ACID-BASE DEFICIT: 5.8 mmol/L — AB (ref 0.0–2.0)
BICARBONATE: 18.9 mmol/L — AB (ref 20.0–28.0)
DRAWN BY: 234301
O2 Content: 3 L/min
O2 Saturation: 86.8 %
PCO2 ART: 45.5 mmHg (ref 32.0–48.0)
PH ART: 7.265 — AB (ref 7.350–7.450)
Patient temperature: 37
pO2, Arterial: 55.2 mmHg — ABNORMAL LOW (ref 83.0–108.0)

## 2016-06-16 LAB — BASIC METABOLIC PANEL
ANION GAP: 7 (ref 5–15)
Anion gap: 7 (ref 5–15)
BUN: 30 mg/dL — ABNORMAL HIGH (ref 6–20)
BUN: 32 mg/dL — ABNORMAL HIGH (ref 6–20)
CALCIUM: 8.7 mg/dL — AB (ref 8.9–10.3)
CHLORIDE: 104 mmol/L (ref 101–111)
CO2: 20 mmol/L — AB (ref 22–32)
CO2: 22 mmol/L (ref 22–32)
CREATININE: 1.88 mg/dL — AB (ref 0.61–1.24)
Calcium: 8.5 mg/dL — ABNORMAL LOW (ref 8.9–10.3)
Chloride: 104 mmol/L (ref 101–111)
Creatinine, Ser: 1.92 mg/dL — ABNORMAL HIGH (ref 0.61–1.24)
GFR calc non Af Amer: 34 mL/min — ABNORMAL LOW (ref >60)
GFR calc non Af Amer: 33 mL/min — ABNORMAL LOW (ref >60)
GFR, EST AFRICAN AMERICAN: 39 mL/min — AB (ref >60)
GFR, EST AFRICAN AMERICAN: 38 mL/min — AB (ref >60)
Glucose, Bld: 109 mg/dL — ABNORMAL HIGH (ref 65–99)
Glucose, Bld: 114 mg/dL — ABNORMAL HIGH (ref 65–99)
POTASSIUM: 6.9 mmol/L — AB (ref 3.5–5.1)
Potassium: 7 mmol/L (ref 3.5–5.1)
SODIUM: 131 mmol/L — AB (ref 135–145)
Sodium: 133 mmol/L — ABNORMAL LOW (ref 135–145)

## 2016-06-16 LAB — URINALYSIS, ROUTINE W REFLEX MICROSCOPIC
BILIRUBIN URINE: NEGATIVE
Glucose, UA: NEGATIVE mg/dL
HGB URINE DIPSTICK: NEGATIVE
Leukocytes, UA: NEGATIVE
Nitrite: NEGATIVE
Specific Gravity, Urine: >1.03 — ABNORMAL HIGH (ref 1.005–1.030)
pH: 5.5 (ref 5.0–8.0)

## 2016-06-16 LAB — CBG MONITORING, ED: Glucose-Capillary: 116 mg/dL — ABNORMAL HIGH (ref 65–99)

## 2016-06-16 LAB — AMMONIA

## 2016-06-16 LAB — BRAIN NATRIURETIC PEPTIDE: B Natriuretic Peptide: 215 pg/mL — ABNORMAL HIGH (ref 0.0–100.0)

## 2016-06-16 LAB — RAPID URINE DRUG SCREEN, HOSP PERFORMED
Amphetamines: NOT DETECTED
BARBITURATES: NOT DETECTED
Benzodiazepines: POSITIVE — AB
Cocaine: NOT DETECTED
OPIATES: POSITIVE — AB
TETRAHYDROCANNABINOL: NOT DETECTED

## 2016-06-16 LAB — ETHANOL: Alcohol, Ethyl (B): 7 mg/dL — ABNORMAL HIGH (ref <5)

## 2016-06-16 MED ORDER — SENNOSIDES-DOCUSATE SODIUM 8.6-50 MG PO TABS: 1.0000 | ORAL_TABLET | Freq: Every evening | ORAL | Status: DC | PRN

## 2016-06-16 MED ORDER — TAMSULOSIN HCL 0.4 MG PO CAPS
0.4000 mg | ORAL_CAPSULE | Freq: Two times a day (BID) | ORAL | Status: DC
  Administered 2016-06-17: 0.4 mg via ORAL
  Filled 2016-06-16: qty 1

## 2016-06-16 MED ORDER — DEXTROSE-NACL 5-0.9 % IV SOLN
INTRAVENOUS | Status: DC
  Administered 2016-06-17: 01:00:00 via INTRAVENOUS

## 2016-06-16 MED ORDER — ALBUTEROL SULFATE (2.5 MG/3ML) 0.083% IN NEBU
2.5000 mg | INHALATION_SOLUTION | Freq: Four times a day (QID) | RESPIRATORY_TRACT | Status: DC
  Administered 2016-06-17: 2.5 mg via RESPIRATORY_TRACT
  Filled 2016-06-16 (×2): qty 3

## 2016-06-16 MED ORDER — DEXTROSE 50 % IV SOLN
INTRAVENOUS | Status: AC
  Filled 2016-06-16: qty 50

## 2016-06-16 MED ORDER — INSULIN ASPART 100 UNIT/ML ~~LOC~~ SOLN
0.0000 [IU] | SUBCUTANEOUS | Status: DC
  Administered 2016-06-17: 3 [IU] via SUBCUTANEOUS
  Administered 2016-06-17 (×2): 8 [IU] via SUBCUTANEOUS

## 2016-06-16 MED ORDER — ALBUTEROL SULFATE (2.5 MG/3ML) 0.083% IN NEBU: 2.5000 mg | INHALATION_SOLUTION | RESPIRATORY_TRACT | Status: DC | PRN

## 2016-06-16 MED ORDER — HALOPERIDOL LACTATE 5 MG/ML IJ SOLN
1.0000 mg | Freq: Four times a day (QID) | INTRAMUSCULAR | Status: DC | PRN
  Administered 2016-06-16 – 2016-06-17 (×3): 1 mg via INTRAVENOUS
  Filled 2016-06-16 (×2): qty 1

## 2016-06-16 MED ORDER — FLUOXETINE HCL 10 MG PO CAPS: 10.0000 mg | ORAL_CAPSULE | Freq: Every day | ORAL | Status: DC

## 2016-06-16 MED ORDER — LORAZEPAM 2 MG/ML IJ SOLN
0.5000 mg | Freq: Once | INTRAMUSCULAR | Status: AC
  Administered 2016-06-16: 0.5 mg via INTRAVENOUS
  Filled 2016-06-16: qty 1

## 2016-06-16 MED ORDER — SODIUM CHLORIDE 0.9 % IV BOLUS (SEPSIS)
1000.0000 mL | Freq: Once | INTRAVENOUS | Status: AC
Start: 2016-06-16 — End: 2016-06-16
  Administered 2016-06-16: 1000 mL via INTRAVENOUS

## 2016-06-16 MED ORDER — ONDANSETRON HCL 4 MG PO TABS: 4.0000 mg | ORAL_TABLET | Freq: Four times a day (QID) | ORAL | Status: DC | PRN

## 2016-06-16 MED ORDER — ASPIRIN EC 81 MG PO TBEC
81.0000 mg | DELAYED_RELEASE_TABLET | Freq: Every day | ORAL | Status: DC
  Administered 2016-06-17: 81 mg via ORAL
  Filled 2016-06-16: qty 1

## 2016-06-16 MED ORDER — SODIUM BICARBONATE 8.4 % IV SOLN
Freq: Once | INTRAVENOUS | Status: AC
  Administered 2016-06-16: 21:00:00 via INTRAVENOUS
  Filled 2016-06-16: qty 100

## 2016-06-16 MED ORDER — GABAPENTIN 300 MG PO CAPS
600.0000 mg | ORAL_CAPSULE | Freq: Two times a day (BID) | ORAL | Status: DC
Start: 2016-06-16 — End: 2016-06-17

## 2016-06-16 MED ORDER — SODIUM POLYSTYRENE SULFONATE 15 GM/60ML PO SUSP
15.0000 g | Freq: Once | ORAL | Status: AC
  Administered 2016-06-17: 15 g via ORAL
  Filled 2016-06-16: qty 60

## 2016-06-16 MED ORDER — ONDANSETRON HCL 4 MG/2ML IJ SOLN: 4.0000 mg | Freq: Four times a day (QID) | INTRAMUSCULAR | Status: DC | PRN

## 2016-06-16 MED ORDER — SODIUM BICARBONATE 8.4 % IV SOLN
INTRAVENOUS | Status: AC
  Filled 2016-06-16: qty 100

## 2016-06-16 MED ORDER — INSULIN ASPART 100 UNIT/ML IV SOLN
10.0000 [IU] | Freq: Once | INTRAVENOUS | Status: AC
  Administered 2016-06-16: 10 [IU] via INTRAVENOUS

## 2016-06-16 MED ORDER — DEXTROSE 50 % IV SOLN
1.0000 | Freq: Once | INTRAVENOUS | Status: AC
  Administered 2016-06-16: 50 mL via INTRAVENOUS

## 2016-06-16 MED ORDER — HEPARIN SODIUM (PORCINE) 5000 UNIT/ML IJ SOLN
5000.0000 [IU] | Freq: Three times a day (TID) | INTRAMUSCULAR | Status: DC
  Administered 2016-06-17 (×2): 5000 [IU] via SUBCUTANEOUS
  Filled 2016-06-16 (×2): qty 1

## 2016-06-16 NOTE — ED Notes (Signed)
EDP notified family in room & stated pt has not been acting himself since around 1330 today. Pt has a productive, congested cough.

## 2016-06-16 NOTE — ED Notes (Signed)
CRITICAL VALUE ALERT  Critical value received:  Potassium 6.9  Date of notification:  06/16/16  Time of notification:  2016  Critical value read back:Yes.    Nurse who received alert:  Derek Mound, RN  MD notified (1st page):  Eulis Foster  Time of first page:  2016  MD notified (2nd page):  Time of second page:  Responding MD:  Eulis Foster  Time MD responded:  2016

## 2016-06-16 NOTE — ED Notes (Signed)
Pt saying he does not want to stay in the hospital he wants to go home. Pt informed he was to sick to try to go home & that his wife & son stated he needs to stay. Pt keeps removing all leads, pulse oximetry & nasal canula.

## 2016-06-16 NOTE — ED Notes (Signed)
Family says pt has been having trouble getting words out since about 0130 today. Pt does have garbbled speech. Trying tell wife something but unable to get out.

## 2016-06-16 NOTE — H&P (Signed)
History and Physical    [name] [id number] DOB: [birth year] DOA: 06/16/2016  PCP: Purvis Kilts, MD  Patient coming from: Home.    Chief Complaint: SOB.  Hypoxia.   HPI: [name] is an 75 y.o. male with hx of alcohol abuse, COPD, DM, CAD, s/p CABG, PVD, s/p L AKA, brought to the ER via EMS for SOB and hypoxia.  En route, he was given IV Steroids.  At home, upon arrival of EMS, he was coherent, but upon arrival to the ER, he was confused and agitated.  He was given ativan as he was confused and agitated.  He cannot give me meaningful history, taking his oxygen off, and wanting to go home.  History is taken from EDP and record.  Work up included a head CT which was negative.  Labs showed K of 7, Cr of 1.92, BS 114, Na 133, NH3 less than 9, alcohol 7, ABG 7.26/45/POX=55/3L. UDS pending, and CXR without infiltrate, ?interstitial edema,  and UA was negative.  Hospitalist was asked to admit him for COPD exacerbation, confusion, AMS, AKI, and hyperkalemia.   ED Course:  See above.  Rewiew of Systems: Unable.   Past Medical History:  Diagnosis Date  . Anginal pain ([place])   . Arthritis    "qwhere" (07/09/2013)  . Asthma   . COPD (chronic obstructive pulmonary disease) ([place])    pt denies this hx on 07/09/2013  . Coronary artery disease   . Gout   . H/O hiatal hernia 1997  . Heart murmur    "had rheumatic fever as a kid" (07/09/2013)  . High cholesterol   . Hypertension   . Myocardial infarction 03/2002; ~ 2011  . Pneumonia    "once" (07/09/2013)  . Renal disorder    "only have 1 kidney; due to go back soon to check the other one" (07/09/2013)  . Rheumatic fever   . RUPTURE ROTATOR CUFF 12/03/2009   Qualifier: Diagnosis of  By: Aline Brochure MD, Dorothyann Peng    . Shortness of breath    "can happen at anytime" (07/09/2013)  . Type II diabetes mellitus ([place])   . Ureter cancer [facility]) 12/2001   "shut down my kidney & resulted in nephrectomy" (07/09/2013)    Rewiew of Systems: Unable  due to his confusion.   Past Surgical History:  Procedure Laterality Date  . ABOVE KNEE LEG AMPUTATION Left 09/2009  . BELOW KNEE LEG AMPUTATION Left 08/2009  . CARDIAC CATHETERIZATION  ~ 2010   "before the bypass" (07/09/2013)  . CARPAL TUNNEL RELEASE Bilateral ~2004; ~ 2011  . CHOLECYSTECTOMY  1980's  . CORONARY ARTERY BYPASS GRAFT  ~ 2010   "CABG X5" (07/09/2013)  . EYE SURGERY    . FEMORAL ARTERY STENT Right 07/09/2013  . GLAUCOMA SURGERY Bilateral ?[id number]  . LOWER EXTREMITY ANGIOGRAM Right Nov. 4, 2014  . LOWER EXTREMITY ANGIOGRAM Right 07/09/2013   Procedure: LOWER EXTREMITY ANGIOGRAM;  Surgeon: Serafina Mitchell, MD;  Location: [facility] CATH LAB;  Service: Cardiovascular;  Laterality: Right;  . NEPHRECTOMY Left 12/19/2001  . PERIPHERAL VASCULAR CATHETERIZATION N/A 02/03/2015   Procedure: Abdominal Aortogram;  Surgeon: Serafina Mitchell, MD;  Location: [facility] CV LAB;  Service: Cardiovascular;  Laterality: N/A;  . TRIGGER FINGER RELEASE Left ~ 1990's     reports that he has been smoking Cigarettes.  He has a 54.00 pack-year smoking history. He has never used smokeless tobacco. He reports that he drinks alcohol. He reports that he does not use  drugs.  Allergies  Allergen Reactions  . Erythromycin Anaphylaxis and Rash  . Iohexol Anaphylaxis, Hives, Itching and Other (See Comments)    Note:  13 HR PRE-MED GIVEN AND SUCCESSFUL-ARS 07/15/07.     Marland Kitchen Penicillins Anaphylaxis, Itching and Other (See Comments)    Has patient had a PCN reaction causing immediate rash, facial/tongue/throat swelling, SOB or lightheadedness with hypotension: Yes Has patient had a PCN reaction causing severe rash involving mucus membranes or skin necrosis: No Has patient had a PCN reaction that required hospitalization No Has patient had a PCN reaction occurring within the last 10 years: No If all of the above answers are "NO", then may proceed with Cephalosporin use.    Family History  Problem Relation Age of  Onset  . Cancer Mother   . Heart attack Father      Prior to Admission medications   Medication Sig Start Date End Date Taking? Authorizing Provider  albuterol (PROVENTIL HFA;VENTOLIN HFA) 108 (90 Base) MCG/ACT inhaler Inhale 2 puffs into the lungs every 4 (four) hours as needed for wheezing or shortness of breath.   Yes Historical Provider, MD  aspirin EC 81 MG tablet Take 81 mg by mouth at bedtime.   Yes Historical Provider, MD  cholecalciferol (VITAMIN D) 400 units TABS tablet Take 1,200 Units by mouth 2 (two) times daily.   Yes Historical Provider, MD  FLUoxetine (PROZAC) 10 MG capsule Take 10 mg by mouth daily.   Yes Historical Provider, MD  gabapentin (NEURONTIN) 300 MG capsule Take 600-900 mg by mouth 2 (two) times daily. Pt takes two capsules in the morning and three at bedtime.   Yes Historical Provider, MD  glimepiride (AMARYL) 4 MG tablet Take 4 mg by mouth daily with breakfast.   Yes Historical Provider, MD  Insulin Glargine (TOUJEO SOLOSTAR) 300 UNIT/ML SOPN Inject 10 Units into the skin daily as needed (for BS greater than 200).   Yes Historical Provider, MD  metFORMIN (GLUCOPHAGE) 500 MG tablet Take 500 mg by mouth 2 (two) times daily with a meal.   Yes Historical Provider, MD  pioglitazone (ACTOS) 30 MG tablet Take 30 mg by mouth daily.   Yes Historical Provider, MD  ramipril (ALTACE) 10 MG capsule Take 10 mg by mouth daily.   Yes Historical Provider, MD  senna-docusate (SENOKOT-S) 8.6-50 MG tablet Take 1 tablet by mouth at bedtime as needed for mild constipation.   Yes Historical Provider, MD  Tamsulosin HCl (FLOMAX) 0.4 MG CAPS Take 0.4 mg by mouth 2 (two) times daily.    Yes Historical Provider, MD  triazolam (HALCION) 0.25 MG tablet Take 0.25 mg by mouth at bedtime as needed for sleep.   Yes Historical Provider, MD  zolpidem (AMBIEN) 10 MG tablet Take 20 mg by mouth at bedtime.   Yes Historical Provider, MD    Physical Exam: Vitals:   06/16/16 2000 06/16/16 2030 06/16/16  2130 06/16/16 2137  BP: 139/85 135/72 130/67   Pulse: 109     Resp: 15 24 17    Temp:    98.7 F (37.1 C)  TempSrc:    Rectal  SpO2: 91%     Weight:      Height:          Constitutional: NAD, calm, comfortable Vitals:   06/16/16 2000 06/16/16 2030 06/16/16 2130 06/16/16 2137  BP: 139/85 135/72 130/67   Pulse: 109     Resp: 15 24 17    Temp:    98.7 F (37.1 C)  TempSrc:    Rectal  SpO2: 91%     Weight:      Height:       Eyes: PERRL, lids and conjunctivae normal ENMT: Mucous membranes are moist. Posterior pharynx clear of any exudate or lesions.Normal dentition.  Neck: normal, supple, no masses, no thyromegaly Respiratory: mild wheezing, scattered crackles.  Normal respiratory effort. No accessory muscle use.  Cardiovascular: Regular rate and rhythm, no murmurs / rubs / gallops. No extremity edema. 2+ pedal pulses. No carotid bruits.  Abdomen: no tenderness, no masses palpated. No hepatosplenomegaly. Bowel sounds positive.  Musculoskeletal: no clubbing / cyanosis. No joint deformity upper and lower extremities. Good ROM, no contractures. Normal muscle tone.  Skin: no rashes, lesions, ulcers. No induration Neurologic: CN 2-12 grossly intact. Sensation intact, DTR normal. Strength 5/5 in all 4.  Psychiatric: Normal judgment and insight. Alert and oriented x 3. Normal mood.     Labs on Admission: I have personally reviewed following labs and imaging studies  CBC:  Recent Labs Lab 06/16/16 1942  WBC 8.5  NEUTROABS 7.4  HGB 14.4  HCT 45.1  MCV 98.3  PLT [id number]   Basic Metabolic Panel:  Recent Labs Lab 06/16/16 1942 06/16/16 2026  NA 131* 133*  K 6.9* 7.0*  CL 104 104  CO2 20* 22  GLUCOSE 109* 114*  BUN 30* 32*  CREATININE 1.88* 1.92*  CALCIUM 8.5* 8.7*    Recent Labs Lab 06/16/16 1942  AMMONIA <9*   CBG:  Recent Labs Lab 06/16/16 2046  GLUCAP 116*   Urine analysis:    Component Value Date/Time   COLORURINE YELLOW 11/24/2015 1101    APPEARANCEUR HAZY (A) 11/24/2015 1101   LABSPEC 1.025 11/24/2015 1101   PHURINE 6.0 11/24/2015 1101   GLUCOSEU NEGATIVE 11/24/2015 1101   HGBUR LARGE (A) 11/24/2015 1101   BILIRUBINUR SMALL (A) 11/24/2015 1101   KETONESUR 40 (A) 11/24/2015 1101   PROTEINUR >300 (A) 11/24/2015 1101   UROBILINOGEN 0.2 09/10/2014 1715   NITRITE POSITIVE (A) 11/24/2015 1101   LEUKOCYTESUR NEGATIVE 11/24/2015 1101    Radiological Exams on Admission: Ct Head Wo Contrast  Result Date: 06/16/2016 CLINICAL DATA:  Acute onset of garbled speech.  Initial encounter. EXAM: CT HEAD WITHOUT CONTRAST TECHNIQUE: Contiguous axial images were obtained from the base of the skull through the vertex without intravenous contrast. COMPARISON:  CT of the head performed 09/16/2014 FINDINGS: Brain: No evidence of acute infarction, hemorrhage, hydrocephalus, extra-axial collection or mass lesion/mass effect. Prominence of the ventricles and sulci reflects mild cortical volume loss. The brainstem and fourth ventricle are within normal limits. The basal ganglia are unremarkable in appearance. The cerebral hemispheres demonstrate grossly normal gray-white differentiation. No mass effect or midline shift is seen. Vascular: No hyperdense vessel or unexpected calcification. Skull: There is no evidence of fracture; visualized osseous structures are unremarkable in appearance. Sinuses/Orbits: The orbits are within normal limits. The paranasal sinuses and mastoid air cells are well-aerated. Other: No significant soft tissue abnormalities are seen. IMPRESSION: 1. No acute intracranial pathology seen on CT. 2. Mild cortical volume loss noted. Electronically Signed   By: Garald Balding M.D.   On: 06/16/2016 20:24   Dg Chest Port 1 View  Result Date: 06/16/2016 CLINICAL DATA:  Shortness of breath and wheezing. EXAM: PORTABLE CHEST 1 VIEW COMPARISON:  06/05/2016 FINDINGS: Previous median sternotomy. The heart is enlarged. There is aortic  atherosclerosis. There is pulmonary venous hypertension, probably with minimal interstitial edema. No alveolar edema. No consolidation or collapse. IMPRESSION:  Probable mild interstitial edema. Electronically Signed   By: Nelson Chimes M.D.   On: 06/16/2016 19:23    EKG: Independently reviewed. Wide QRS  488ms, ST at 110, no acute ST T changes.   Assessment/Plan Active Problems:   Acute encephalopathy   COPD exacerbation (HCC)    PLAN:   AMS:  Multiple etiologies including steroid psychosis, hypoxia, and narcotics.  Will avoid further steroid.  Give supplemental oxygen.  Avoid further psych meds, but he is agitated, and taking his oxygen off, so we may be forced to sedate him a little until he is better.   If he is still confused tomorrow, would obtain an MRI to exlude a CVA.  Check UDS.  COPD exacerbation:  Receive one dose of IV steroids.  Will avoid further steroid.  Hyperkalemia:  Treated acutely.  Will give Kayexalate.  Follow every 4 hours.  Telemetry.  Give IVF and avoid nephrotoxic drug.    DVT prophylaxis: SUBQ>  Code Status: FULL CODE.  Family Communication: none.  Disposition Plan: home when appropriate.  Consults called: None. Admission status: Inpatient.    Idalia Allbritton MD FACP. [facility]  If 7PM-7AM, please contact night-coverage [email or web address] Password [email or web address]  06/16/2016, 10:06 PM

## 2016-06-16 NOTE — ED Notes (Signed)
Pt was given Albuterol x2 and Solumedrol 125mg  IVP by ems.

## 2016-06-16 NOTE — ED Notes (Signed)
Critical K+ 7.  Dr Eulis Foster notified

## 2016-06-16 NOTE — ED Triage Notes (Signed)
Pt reports shortness of breath today with wheezing.  Ems reports mental status change on way to hospital.  Pt is alert and oriented, but states "ok" to most questions asked.  Dr. Eulis Foster called to bedside.

## 2016-06-16 NOTE — ED Provider Notes (Signed)
[place] DEPT Provider Note   CSN: [id number] Arrival date & time: 06/16/16  1839     History   Chief Complaint Chief Complaint  Patient presents with  . Shortness of Breath  . Altered Mental Status    HPI [name] is a 75 y.o. male.  He presents for shortness of breath followed by confusion. EMS found him in his home, complaining of shortness of breath,. Of time unknown. Plan transport, he received 2 albuterol nebulizers. As they got close to the hospital they felt his mental status, began to decline. They did not notice any other changes.  Level V caveat- altered mental status  HPI  Past Medical History:  Diagnosis Date  . Anginal pain ([place])   . Arthritis    "qwhere" (07/09/2013)  . Asthma   . COPD (chronic obstructive pulmonary disease) ([place])    pt denies this hx on 07/09/2013  . Coronary artery disease   . Gout   . H/O hiatal hernia 1997  . Heart murmur    "had rheumatic fever as a kid" (07/09/2013)  . High cholesterol   . Hypertension   . Myocardial infarction 03/2002; ~ 2011  . Pneumonia    "once" (07/09/2013)  . Renal disorder    "only have 1 kidney; due to go back soon to check the other one" (07/09/2013)  . Rheumatic fever   . RUPTURE ROTATOR CUFF 12/03/2009   Qualifier: Diagnosis of  By: Aline Brochure MD, Dorothyann Peng    . Shortness of breath    "can happen at anytime" (07/09/2013)  . Type II diabetes mellitus ([place])   . 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 Hyperkalemia 11/24/2015  . UTI (lower urinary tract infection) 11/24/2015  . Acute UTI 09/11/2014  . Acute respiratory failure with hypercapnia ([place]) 09/10/2014  . Acute encephalopathy 09/10/2014  . Alcoholic dementia ([place]) [id number]  . Recurrent falls 09/10/2014  . Left shoulder pain 09/10/2014  . CAD in native artery 09/10/2014  . Diabetes mellitus with peripheral vascular disease  ([place]) 09/10/2014  . FTT (failure to thrive) in adult 09/10/2014  . Protein-calorie malnutrition ([place]) 09/10/2014  . Mixed acid base balance disorder 09/09/2014  . Acute kidney injury ([place]) 09/09/2014  . Hypotension 09/09/2014  . Fall 08/27/2014  . Altered mental status 08/26/2014  . Dehydration 08/26/2014  . Alcohol abuse 08/26/2014  . Hypertension 08/26/2014  . Aftercare following surgery of the circulatory system, [place] 08/05/2013  . Leg pain 07/01/2013  . Peripheral vascular disease ([place]) 05/27/2013  . Peripheral angiopathy in diseases classified elsewhere ([place]) 05/02/2012  . Diabetes ([place]) 05/02/2012  . Pain in limb 05/02/2012  . Chronic total occlusion of artery of the extremities ([place]) 05/02/2012  . JOINT EFFUSION, KNEE 12/03/2009  . RUPTURE ROTATOR CUFF 12/03/2009  . ISCHEMIC CARDIOMYOPATHY 08/24/2009  . CHF 08/24/2009  . BACK PAIN, LUMBAR, CHRONIC 08/24/2009    Past Surgical History:  Procedure Laterality Date  . ABOVE KNEE LEG AMPUTATION Left 09/2009  . BELOW KNEE LEG AMPUTATION Left 08/2009  . CARDIAC CATHETERIZATION  ~ 2010   "before the bypass" (07/09/2013)  . CARPAL TUNNEL RELEASE Bilateral ~2004; ~ 2011  . CHOLECYSTECTOMY  1980's  . CORONARY ARTERY BYPASS GRAFT  ~ 2010   "CABG X5" (07/09/2013)  . EYE SURGERY    . FEMORAL ARTERY STENT Right 07/09/2013  . GLAUCOMA SURGERY Bilateral ?[id number]  .  LOWER EXTREMITY ANGIOGRAM Right Nov. 4, 2014  . LOWER EXTREMITY ANGIOGRAM Right 07/09/2013   Procedure: LOWER EXTREMITY ANGIOGRAM;  Surgeon: Serafina Mitchell, MD;  Location: [facility] CATH LAB;  Service: Cardiovascular;  Laterality: Right;  . NEPHRECTOMY Left 12/19/2001  . PERIPHERAL VASCULAR CATHETERIZATION N/A 02/03/2015   Procedure: Abdominal Aortogram;  Surgeon: Serafina Mitchell, MD;  Location: [place] CV LAB;  Service: Cardiovascular;  Laterality: N/A;  . TRIGGER FINGER RELEASE Left ~ 1990's       Home Medications    Prior to Admission medications   Medication Sig  Start Date End Date Taking? Authorizing Provider  albuterol (PROVENTIL HFA;VENTOLIN HFA) 108 (90 Base) MCG/ACT inhaler Inhale 2 puffs into the lungs every 4 (four) hours as needed for wheezing or shortness of breath.   Yes Historical Provider, MD  aspirin EC 81 MG tablet Take 81 mg by mouth at bedtime.   Yes Historical Provider, MD  cholecalciferol (VITAMIN D) 400 units TABS tablet Take 1,200 Units by mouth 2 (two) times daily.   Yes Historical Provider, MD  FLUoxetine (PROZAC) 10 MG capsule Take 10 mg by mouth daily.   Yes Historical Provider, MD  gabapentin (NEURONTIN) 300 MG capsule Take 600-900 mg by mouth 2 (two) times daily. Pt takes two capsules in the morning and three at bedtime.   Yes Historical Provider, MD  glimepiride (AMARYL) 4 MG tablet Take 4 mg by mouth daily with breakfast.   Yes Historical Provider, MD  Insulin Glargine (TOUJEO SOLOSTAR) 300 UNIT/ML SOPN Inject 10 Units into the skin daily as needed (for BS greater than 200).   Yes Historical Provider, MD  metFORMIN (GLUCOPHAGE) 500 MG tablet Take 500 mg by mouth 2 (two) times daily with a meal.   Yes Historical Provider, MD  pioglitazone (ACTOS) 30 MG tablet Take 30 mg by mouth daily.   Yes Historical Provider, MD  ramipril (ALTACE) 10 MG capsule Take 10 mg by mouth daily.   Yes Historical Provider, MD  senna-docusate (SENOKOT-S) 8.6-50 MG tablet Take 1 tablet by mouth at bedtime as needed for mild constipation.   Yes Historical Provider, MD  Tamsulosin HCl (FLOMAX) 0.4 MG CAPS Take 0.4 mg by mouth 2 (two) times daily.    Yes Historical Provider, MD  triazolam (HALCION) 0.25 MG tablet Take 0.25 mg by mouth at bedtime as needed for sleep.   Yes Historical Provider, MD  zolpidem (AMBIEN) 10 MG tablet Take 20 mg by mouth at bedtime.   Yes Historical Provider, MD    Family History Family History  Problem Relation Age of Onset  . Cancer Mother   . Heart attack Father     Social History Social History  Substance Use Topics  .  Smoking status: Current Every Day Smoker    Packs/day: 1.00    Years: 54.00    Types: Cigarettes  . Smokeless tobacco: Never Used  . Alcohol use 0.0 oz/week     Comment: social drinker     Allergies   Erythromycin; Iohexol; and Penicillins   Review of Systems Review of Systems  Unable to perform ROS: Mental status change     Physical Exam Updated Vital Signs BP 135/72   Pulse 109   Temp 98.6 F (37 C) (Oral)   Resp 24   Ht 6\' 3"  (1.905 m)   Wt 207 lb (93.9 kg)   SpO2 91%   BMI 25.87 kg/m   Physical Exam  Constitutional: He appears well-developed.  Elderly,  HENT:  Head:  Normocephalic and atraumatic.  Right Ear: External ear normal.  Left Ear: External ear normal.  Eyes: Conjunctivae and EOM are normal. Pupils are equal, round, and reactive to light.  Neck: Normal range of motion and phonation normal. Neck supple.  Cardiovascular: Regular rhythm and normal heart sounds.   Tachycardic  Pulmonary/Chest: Effort normal and breath sounds normal. No respiratory distress. He exhibits no bony tenderness.  Abdominal: Soft. There is no tenderness.  Musculoskeletal: Normal range of motion.  Left leg above-knee amputation  Neurological: He is alert. No cranial nerve deficit or sensory deficit. He exhibits normal muscle tone. Coordination normal.  Mild dysarthria. He follows commands accurately. He is unable to state why he is here, where he has or what happened today.  Skin: Skin is warm, dry and intact.  Psychiatric:  He appears sleepy  Nursing note and vitals reviewed.    ED Treatments / Results  Labs (all labs ordered are listed, but only abnormal results are displayed) Labs Reviewed  BLOOD GAS, ARTERIAL - Abnormal; Notable for the following:       Result Value   pH, Arterial 7.265 (*)    pO2, Arterial 55.2 (*)    Bicarbonate 18.9 (*)    Acid-base deficit 5.8 (*)    All other components within normal limits  BASIC METABOLIC PANEL - Abnormal; Notable for the  following:    Sodium 131 (*)    Potassium 6.9 (*)    CO2 20 (*)    Glucose, Bld 109 (*)    BUN 30 (*)    Creatinine, Ser 1.88 (*)    Calcium 8.5 (*)    GFR calc non Af Amer 34 (*)    GFR calc Af Amer 39 (*)    All other components within normal limits  CBC WITH DIFFERENTIAL/PLATELET - Abnormal; Notable for the following:    RDW 15.7 (*)    All other components within normal limits  BRAIN NATRIURETIC PEPTIDE - Abnormal; Notable for the following:    B Natriuretic Peptide 215.0 (*)    All other components within normal limits  AMMONIA - Abnormal; Notable for the following:    Ammonia <9 (*)    All other components within normal limits  ETHANOL - Abnormal; Notable for the following:    Alcohol, Ethyl (B) 7 (*)    All other components within normal limits  BASIC METABOLIC PANEL - Abnormal; Notable for the following:    Sodium 133 (*)    Potassium 7.0 (*)    Glucose, Bld 114 (*)    BUN 32 (*)    Creatinine, Ser 1.92 (*)    Calcium 8.7 (*)    GFR calc non Af Amer 33 (*)    GFR calc Af Amer 38 (*)    All other components within normal limits  CBG MONITORING, ED - Abnormal; Notable for the following:    Glucose-Capillary 116 (*)    All other components within normal limits  POTASSIUM  POTASSIUM    BUN  Date Value Ref Range Status  06/16/2016 32 (H) 6 - 20 mg/dL Final  06/16/2016 30 (H) 6 - 20 mg/dL Final  06/06/2016 20 6 - 20 mg/dL Final  06/05/2016 16 6 - 20 mg/dL Final   Creatinine, Ser  Date Value Ref Range Status  06/16/2016 1.92 (H) 0.61 - 1.24 mg/dL Final  06/16/2016 1.88 (H) 0.61 - 1.24 mg/dL Final  06/06/2016 1.46 (H) 0.61 - 1.24 mg/dL Final  06/05/2016 1.23 0.61 - 1.24 mg/dL Final  EKG  EKG Interpretation  Date/Time:  Thursday June 16 2016 18:43:43 EDT Ventricular Rate:  113 PR Interval:    QRS Duration: 110 QT Interval:  318 QTC Calculation: 436 R Axis:   85 Text Interpretation:  Sinus tachycardia Low voltage with right axis deviation  Anteroseptal infarct, old Since last tracing rate faster Confirmed by [facility]  MD, Lleyton Byers [phone]) on 06/16/2016 7:41:35 PM       Radiology Ct Head Wo Contrast  Result Date: 06/16/2016 CLINICAL DATA:  Acute onset of garbled speech.  Initial encounter. EXAM: CT HEAD WITHOUT CONTRAST TECHNIQUE: Contiguous axial images were obtained from the base of the skull through the vertex without intravenous contrast. COMPARISON:  CT of the head performed 09/16/2014 FINDINGS: Brain: No evidence of acute infarction, hemorrhage, hydrocephalus, extra-axial collection or mass lesion/mass effect. Prominence of the ventricles and sulci reflects mild cortical volume loss. The brainstem and fourth ventricle are within normal limits. The basal ganglia are unremarkable in appearance. The cerebral hemispheres demonstrate grossly normal gray-white differentiation. No mass effect or midline shift is seen. Vascular: No hyperdense vessel or unexpected calcification. Skull: There is no evidence of fracture; visualized osseous structures are unremarkable in appearance. Sinuses/Orbits: The orbits are within normal limits. The paranasal sinuses and mastoid air cells are well-aerated. Other: No significant soft tissue abnormalities are seen. IMPRESSION: 1. No acute intracranial pathology seen on CT. 2. Mild cortical volume loss noted. Electronically Signed   By: Garald Balding M.D.   On: 06/16/2016 20:24   Dg Chest Port 1 View  Result Date: 06/16/2016 CLINICAL DATA:  Shortness of breath and wheezing. EXAM: PORTABLE CHEST 1 VIEW COMPARISON:  06/05/2016 FINDINGS: Previous median sternotomy. The heart is enlarged. There is aortic atherosclerosis. There is pulmonary venous hypertension, probably with minimal interstitial edema. No alveolar edema. No consolidation or collapse. IMPRESSION: Probable mild interstitial edema. Electronically Signed   By: Nelson Chimes M.D.   On: 06/16/2016 19:23    Procedures Procedures (including critical care  time)  Medications Ordered in ED Medications  sodium bicarbonate 100 mEq in dextrose 5 % 1,000 mL infusion ( Intravenous New Bag/Given 06/16/16 2105)  LORazepam (ATIVAN) injection 0.5 mg (0.5 mg Intravenous Given 06/16/16 1934)  insulin aspart (novoLOG) injection 10 Units (10 Units Intravenous Given 06/16/16 2052)  dextrose 50 % solution 50 mL (50 mLs Intravenous Given 06/16/16 2047)     Initial Impression / Assessment and Plan / ED Course  I have reviewed the triage vital signs and the nursing notes.  Pertinent labs & imaging results that were available during my care of the patient were reviewed by me and considered in my medical decision making (see chart for details).  Clinical Course  Value Comment By Time  pO2, Arterial: (!) 55.2 Low Daleen Bo, MD 10/12 2018  pCO2 arterial: 45.5 Normal Daleen Bo, MD 10/12 2018  Potassium: (!!) 6.9 High Daleen Bo, MD 10/12 2018  BUN: (!) 30 High Daleen Bo, MD 10/12 2018    Medications  sodium bicarbonate 100 mEq in dextrose 5 % 1,000 mL infusion ( Intravenous New Bag/Given 06/16/16 2105)  LORazepam (ATIVAN) injection 0.5 mg (0.5 mg Intravenous Given 06/16/16 1934)  insulin aspart (novoLOG) injection 10 Units (10 Units Intravenous Given 06/16/16 2052)  dextrose 50 % solution 50 mL (50 mLs Intravenous Given 06/16/16 2047)    Patient Vitals for the past 24 hrs:  BP Temp Temp src Pulse Resp SpO2 Height Weight  06/16/16 2030 135/72 - - - 24 - - -  06/16/16 2000 139/85 - - 109 15 91 % - -  06/16/16 1930 138/94 - - 109 18 (!) 89 % - -  06/16/16 1914 - - - - - 90 % - -  06/16/16 1846 141/78 98.6 F (37 C) Oral 110 20 (!) 87 % - -  06/16/16 1843 - - - - - - 6\' 3"  (1.905 m) 207 lb (93.9 kg)    9:13 PM Reevaluation with update and discussion. After initial assessment and treatment, an updated evaluation reveals No change in clinical status. Ector Laurel L   9:17 PM-Consult complete with Hospitalist. Patient case explained and  discussed. He agrees to admit patient for further evaluation and treatment. Call ended at 2140  CRITICAL CARE Performed by: Richarda Blade Total critical care time: 45 minutes Critical care time was exclusive of separately billable procedures and treating other patients. Critical care was necessary to treat or prevent imminent or life-threatening deterioration. Critical care was time spent personally by me on the following activities: development of treatment plan with patient and/or surrogate as well as nursing, discussions with consultants, evaluation of patient's response to treatment, examination of patient, obtaining history from patient or surrogate, ordering and performing treatments and interventions, ordering and review of laboratory studies, ordering and review of radiographic studies, pulse oximetry and re-evaluation of patient's condition.   Final Clinical Impressions(s) / ED Diagnoses   Final diagnoses:  Transient alteration of awareness  Hyperkalemia  COPD exacerbation (HCC)  Hypoxia   Delirium with hyperkalemia, and COPD exacerbation. No clear evidence for hypercapnic respiratory failure. Moderate hypoxia is present, with borderline hypoxia using nasal cannula supplementation. Mild creatinine elevation from baseline. Unclear cause of hyperkalemia. Patient will require admission for further evaluation and treatment. Doubt serious bacterial infection or impending vascular collapse.  Nursing Notes Reviewed/ Care Coordinated, and agree without changes. Applicable Imaging Reviewed.  Interpretation of Laboratory Data incorporated into ED treatment  Plan: Admit   New Prescriptions New Prescriptions   No medications on file     Daleen Bo, MD 06/16/16 2152

## 2016-06-17 ENCOUNTER — Encounter (HOSPITAL_COMMUNITY): Payer: Self-pay

## 2016-06-17 DIAGNOSIS — G9340 Encephalopathy, unspecified: Secondary | ICD-10-CM

## 2016-06-17 DIAGNOSIS — J441 Chronic obstructive pulmonary disease with (acute) exacerbation: Principal | ICD-10-CM

## 2016-06-17 LAB — COMPREHENSIVE METABOLIC PANEL
ALBUMIN: 3.5 g/dL (ref 3.5–5.0)
ALK PHOS: 42 U/L (ref 38–126)
ALT: 14 U/L — AB (ref 17–63)
AST: 19 U/L (ref 15–41)
Anion gap: 8 (ref 5–15)
BILIRUBIN TOTAL: 0.7 mg/dL (ref 0.3–1.2)
BUN: 29 mg/dL — AB (ref 6–20)
CALCIUM: 8.5 mg/dL — AB (ref 8.9–10.3)
CO2: 21 mmol/L — ABNORMAL LOW (ref 22–32)
CREATININE: 1.56 mg/dL — AB (ref 0.61–1.24)
Chloride: 103 mmol/L (ref 101–111)
GFR calc Af Amer: 49 mL/min — ABNORMAL LOW (ref >60)
GFR, EST NON AFRICAN AMERICAN: 42 mL/min — AB (ref >60)
GLUCOSE: 225 mg/dL — AB (ref 65–99)
POTASSIUM: 4.8 mmol/L (ref 3.5–5.1)
Sodium: 132 mmol/L — ABNORMAL LOW (ref 135–145)
TOTAL PROTEIN: 6.9 g/dL (ref 6.5–8.1)

## 2016-06-17 LAB — GLUCOSE, CAPILLARY
GLUCOSE-CAPILLARY: 208 mg/dL — AB (ref 65–99)
Glucose-Capillary: 185 mg/dL — ABNORMAL HIGH (ref 65–99)
Glucose-Capillary: 294 mg/dL — ABNORMAL HIGH (ref 65–99)

## 2016-06-17 LAB — TSH: TSH: 1.424 u[IU]/mL (ref 0.350–4.500)

## 2016-06-17 LAB — CBC
HEMATOCRIT: 43.9 % (ref 39.0–52.0)
Hemoglobin: 14 g/dL (ref 13.0–17.0)
MCH: 30.6 pg (ref 26.0–34.0)
MCHC: 31.9 g/dL (ref 30.0–36.0)
MCV: 96.1 fL (ref 78.0–100.0)
PLATELETS: 167 10*3/uL (ref 150–400)
RBC: 4.57 MIL/uL (ref 4.22–5.81)
RDW: 14.9 % (ref 11.5–15.5)
WBC: 3.9 10*3/uL — AB (ref 4.0–10.5)

## 2016-06-17 LAB — POTASSIUM: Potassium: 5.1 mmol/L (ref 3.5–5.1)

## 2016-06-17 MED ORDER — GABAPENTIN 300 MG PO CAPS: 600.0000 mg | ORAL_CAPSULE | Freq: Every day | ORAL | Status: DC

## 2016-06-17 MED ORDER — GABAPENTIN 300 MG PO CAPS
900.0000 mg | ORAL_CAPSULE | Freq: Every day | ORAL | Status: DC
  Administered 2016-06-17: 900 mg via ORAL
  Filled 2016-06-17: qty 3

## 2016-06-17 NOTE — Progress Notes (Signed)
Pt unable to answer admission questions. 

## 2016-06-17 NOTE — Progress Notes (Signed)
Pt continues to pull at his tele heart monitor and his iv. Mittens were placed on pt. Pt throw his right leg over the rail trying to get out bed. As per nursing tech, pt was being combative.  Will continue to monitor

## 2016-06-17 NOTE — Discharge Summary (Signed)
Physician Discharge Summary  [name] [id number] DOB: 1940-10-04 DOA: 06/16/2016  PCP: Purvis Kilts, MD  Admit date: 06/16/2016 Discharge date: 06/17/2016  Admitted From: Home Disposition:  Home  Patient Left Against Medical Advise  Patient asked to follow up with his PCP soon   Brief/Interim Summary: Please see dictated H&P dated 06/16/2016 for details. [place] y.o. male with hx of alcohol abuse, COPD, DM, CAD, s/p CABG, PVD, s/p L AKA, brought to the ER via EMS for SOB and hypoxia.  En route, he was given IV Steroids.  At home, upon arrival of EMS, he was coherent, but upon arrival to the ER, he was confused and agitated.  He was given ativan as he was confused and agitated.  He cannot give me meaningful history, taking his oxygen off, and wanting to go home.  Toxic metabolic encephalopathy - Presenting H&P reviewed. Patient initially presented to the emergency department for shortness of breath. -Patient was given steroids in the emergency department. Shortly thereafter, patient noted to be more confused and agitated. -Patient's of squamous admitted to the medical floor for further workup -By the following morning, patient again wanted to leave [place] -In the presence of the bedside nurse, patient was able to demonstrate full insight to his medical care and was oriented 3. -Patient understands the risks of leaving the hospital at this time which includes, but is not limited to, further decompensation and even death -AMA form signed and patient left AGAINST MEDICAL ADVICE  COPD exacerbation -Patient was weaned to 2 L nasal cannula by the time of him leaving [place] -Patient advised to follow-up closely with his family physician, he agrees  Hyperkalemia -Corrected  CAD -Patient has history of 5 vessel CABG -Denied chest pains -Her main stable  Hypertension -Remained stable  Discharge Diagnoses:  Active Problems:   Acute  encephalopathy   COPD exacerbation [facility])    Discharge Instructions  Left Against Medical Advise  Allergies  Allergen Reactions  . Erythromycin Anaphylaxis and Rash  . Iohexol Anaphylaxis, Hives, Itching and Other (See Comments)    Note:  13 HR PRE-MED GIVEN AND SUCCESSFUL-ARS 07/15/07.     Marland Kitchen Penicillins Anaphylaxis, Itching and Other (See Comments)    Has patient had a PCN reaction causing immediate rash, facial/tongue/throat swelling, SOB or lightheadedness with hypotension: Yes Has patient had a PCN reaction causing severe rash involving mucus membranes or skin necrosis: No Has patient had a PCN reaction that required hospitalization No Has patient had a PCN reaction occurring within the last 10 years: No If all of the above answers are "NO", then may proceed with Cephalosporin use.     Procedures/Studies: Ct Head Wo Contrast  Result Date: 06/16/2016 CLINICAL DATA:  Acute onset of garbled speech.  Initial encounter. EXAM: CT HEAD WITHOUT CONTRAST TECHNIQUE: Contiguous axial images were obtained from the base of the skull through the vertex without intravenous contrast. COMPARISON:  CT of the head performed 09/16/2014 FINDINGS: Brain: No evidence of acute infarction, hemorrhage, hydrocephalus, extra-axial collection or mass lesion/mass effect. Prominence of the ventricles and sulci reflects mild cortical volume loss. The brainstem and fourth ventricle are within normal limits. The basal ganglia are unremarkable in appearance. The cerebral hemispheres demonstrate grossly normal gray-white differentiation. No mass effect or midline shift is seen. Vascular: No hyperdense vessel or unexpected calcification. Skull: There is no evidence of fracture; visualized osseous structures are unremarkable in appearance. Sinuses/Orbits: The orbits are within normal limits. The paranasal sinuses and mastoid  air cells are well-aerated. Other: No significant soft tissue abnormalities are seen. IMPRESSION:  1. No acute intracranial pathology seen on CT. 2. Mild cortical volume loss noted. Electronically Signed   By: Garald Balding M.D.   On: 06/16/2016 20:24   Dg Chest Port 1 View  Result Date: 06/16/2016 CLINICAL DATA:  Shortness of breath and wheezing. EXAM: PORTABLE CHEST 1 VIEW COMPARISON:  06/05/2016 FINDINGS: Previous median sternotomy. The heart is enlarged. There is aortic atherosclerosis. There is pulmonary venous hypertension, probably with minimal interstitial edema. No alveolar edema. No consolidation or collapse. IMPRESSION: Probable mild interstitial edema. Electronically Signed   By: Nelson Chimes M.D.   On: 06/16/2016 19:23   Dg Chest Portable 1 View  Result Date: 06/05/2016 CLINICAL DATA:  Shortness of breath. EXAM: PORTABLE CHEST 1 VIEW COMPARISON:  Radiographs of November 24, 2015. FINDINGS: Stable cardiomediastinal silhouette. Sternotomy wires are noted. No pneumothorax or pleural effusion is noted. No acute pulmonary disease is noted. Bony thorax is unremarkable. IMPRESSION: No acute cardiopulmonary abnormality seen. Electronically Signed   By: Marijo Conception, M.D.   On: 06/05/2016 14:28    Subjective: Wanting to go home today  Discharge Exam: Vitals:   06/16/16 2323 06/17/16 0520  BP: (!) 151/81 (!) 179/95  Pulse: (!) 101 97  Resp: 20 20  Temp:  98.1 F (36.7 C)   Vitals:   06/16/16 2137 06/16/16 2323 06/17/16 0520 06/17/16 0729  BP:  (!) 151/81 (!) 179/95   Pulse:  (!) 101 97   Resp:  20 20   Temp: 98.7 F (37.1 C)  98.1 F (36.7 C)   TempSrc: Rectal  Axillary   SpO2:  98% 98% 94%  Weight:      Height:        General: Pt is alert, awake, not in acute distress Cardiovascular: RRR, S1/S2 +, no rubs, no gallops Respiratory: Normal respiratory effort, coarse breath sounds Abdominal: Soft, NT, ND, bowel sounds + Extremities: no edema, no cyanosis   The results of significant diagnostics from this hospitalization (including imaging, microbiology, ancillary and  laboratory) are listed below for reference.     Microbiology: No results found for this or any previous visit (from the past 240 hour(s)).   Labs: BNP (last 3 results)  Recent Labs  11/24/15 1020 06/16/16 1942  BNP 281.0* [id number]*   Basic Metabolic Panel:  Recent Labs Lab 06/16/16 1942 06/16/16 2026 06/17/16 0355 06/17/16 0827  NA 131* 133* 132*  --   K 6.9* 7.0* 4.8 5.1  CL 104 104 103  --   CO2 20* 22 21*  --   GLUCOSE 109* 114* 225*  --   BUN 30* 32* 29*  --   CREATININE 1.88* 1.92* 1.56*  --   CALCIUM 8.5* 8.7* 8.5*  --    Liver Function Tests:  Recent Labs Lab 06/17/16 0355  AST 19  ALT 14*  ALKPHOS 42  BILITOT 0.7  PROT 6.9  ALBUMIN 3.5   No results for input(s): LIPASE, AMYLASE in the last 168 hours.  Recent Labs Lab 06/16/16 1942  AMMONIA <9*   CBC:  Recent Labs Lab 06/16/16 1942 06/17/16 0355  WBC 8.5 3.9*  NEUTROABS 7.4  --   HGB 14.4 14.0  HCT 45.1 43.9  MCV 98.3 96.1  PLT 171 167   Cardiac Enzymes: No results for input(s): CKTOTAL, CKMB, CKMBINDEX, TROPONINI in the last 168 hours. BNP: Invalid input(s): POCBNP CBG:  Recent Labs Lab 06/16/16 2046 06/17/16 0039 06/17/16  [id number] 06/17/16 0817  GLUCAP 116* 294* 208* 185*   D-Dimer No results for input(s): DDIMER in the last 72 hours. Hgb A1c No results for input(s): HGBA1C in the last 72 hours. Lipid Profile No results for input(s): CHOL, HDL, LDLCALC, TRIG, CHOLHDL, LDLDIRECT in the last 72 hours. Thyroid function studies  Recent Labs  06/16/16 1942  TSH 1.424   Anemia work up No results for input(s): VITAMINB12, FOLATE, FERRITIN, TIBC, IRON, RETICCTPCT in the last 72 hours. Urinalysis    Component Value Date/Time   COLORURINE YELLOW 06/16/2016 2121   APPEARANCEUR CLEAR 06/16/2016 2121   LABSPEC >1.030 (H) 06/16/2016 2121   PHURINE 5.5 06/16/2016 2121   GLUCOSEU NEGATIVE 06/16/2016 2121   HGBUR NEGATIVE 06/16/2016 2121   BILIRUBINUR NEGATIVE 06/16/2016 2121    KETONESUR TRACE (A) 06/16/2016 2121   PROTEINUR TRACE (A) 06/16/2016 2121   UROBILINOGEN 0.2 09/10/2014 1715   NITRITE NEGATIVE 06/16/2016 2121   LEUKOCYTESUR NEGATIVE 06/16/2016 2121   Sepsis Labs Invalid input(s): PROCALCITONIN,  WBC,  LACTICIDVEN Microbiology No results found for this or any previous visit (from the past 240 hour(s)).   SIGNED:   Donne Hazel, MD  [facility] 06/17/2016, 1:36 PM  If 7PM-7AM, please contact night-coverage [email or web address] Password [email or web address]

## 2016-06-23 DIAGNOSIS — E1165 Type 2 diabetes mellitus with hyperglycemia: Secondary | ICD-10-CM | POA: Diagnosis not present

## 2016-06-23 DIAGNOSIS — J441 Chronic obstructive pulmonary disease with (acute) exacerbation: Secondary | ICD-10-CM | POA: Diagnosis not present

## 2016-06-23 DIAGNOSIS — J449 Chronic obstructive pulmonary disease, unspecified: Secondary | ICD-10-CM | POA: Diagnosis not present

## 2016-06-23 DIAGNOSIS — Z681 Body mass index (BMI) 19 or less, adult: Secondary | ICD-10-CM | POA: Diagnosis not present

## 2016-06-23 DIAGNOSIS — G9340 Encephalopathy, unspecified: Secondary | ICD-10-CM | POA: Diagnosis not present

## 2016-06-23 DIAGNOSIS — Z1389 Encounter for screening for other disorder: Secondary | ICD-10-CM | POA: Diagnosis not present

## 2016-06-29 ENCOUNTER — Emergency Department (HOSPITAL_COMMUNITY): Payer: Medicare Other

## 2016-06-29 ENCOUNTER — Encounter (HOSPITAL_COMMUNITY): Payer: Self-pay | Admitting: Emergency Medicine

## 2016-06-29 ENCOUNTER — Inpatient Hospital Stay (HOSPITAL_COMMUNITY)
Admission: EM | Admit: 2016-06-29 | Discharge: 2016-07-02 | DRG: 071 | Disposition: A | Discharge status: Home / Self Care | Payer: Medicare Other | Attending: Internal Medicine | Admitting: Internal Medicine

## 2016-06-29 DIAGNOSIS — E871 Hypo-osmolality and hyponatremia: Secondary | ICD-10-CM | POA: Diagnosis present

## 2016-06-29 DIAGNOSIS — F0280 Dementia in other diseases classified elsewhere without behavioral disturbance: Secondary | ICD-10-CM | POA: Diagnosis present

## 2016-06-29 DIAGNOSIS — R404 Transient alteration of awareness: Secondary | ICD-10-CM

## 2016-06-29 DIAGNOSIS — N183 Chronic kidney disease, stage 3 unspecified: Secondary | ICD-10-CM | POA: Diagnosis present

## 2016-06-29 DIAGNOSIS — R059 Cough, unspecified: Secondary | ICD-10-CM

## 2016-06-29 DIAGNOSIS — I130 Hypertensive heart and chronic kidney disease with heart failure and stage 1 through stage 4 chronic kidney disease, or unspecified chronic kidney disease: Secondary | ICD-10-CM | POA: Diagnosis present

## 2016-06-29 DIAGNOSIS — Z8249 Family history of ischemic heart disease and other diseases of the circulatory system: Secondary | ICD-10-CM

## 2016-06-29 DIAGNOSIS — T50901A Poisoning by unspecified drugs, medicaments and biological substances, accidental (unintentional), initial encounter: Secondary | ICD-10-CM | POA: Diagnosis not present

## 2016-06-29 DIAGNOSIS — E1151 Type 2 diabetes mellitus with diabetic peripheral angiopathy without gangrene: Secondary | ICD-10-CM | POA: Diagnosis not present

## 2016-06-29 DIAGNOSIS — M79671 Pain in right foot: Secondary | ICD-10-CM | POA: Diagnosis not present

## 2016-06-29 DIAGNOSIS — N179 Acute kidney failure, unspecified: Secondary | ICD-10-CM | POA: Diagnosis present

## 2016-06-29 DIAGNOSIS — R4182 Altered mental status, unspecified: Secondary | ICD-10-CM | POA: Diagnosis not present

## 2016-06-29 DIAGNOSIS — J441 Chronic obstructive pulmonary disease with (acute) exacerbation: Secondary | ICD-10-CM | POA: Diagnosis present

## 2016-06-29 DIAGNOSIS — E11649 Type 2 diabetes mellitus with hypoglycemia without coma: Secondary | ICD-10-CM | POA: Diagnosis present

## 2016-06-29 DIAGNOSIS — I252 Old myocardial infarction: Secondary | ICD-10-CM

## 2016-06-29 DIAGNOSIS — E162 Hypoglycemia, unspecified: Secondary | ICD-10-CM

## 2016-06-29 DIAGNOSIS — R0989 Other specified symptoms and signs involving the circulatory and respiratory systems: Secondary | ICD-10-CM

## 2016-06-29 DIAGNOSIS — G9340 Encephalopathy, unspecified: Principal | ICD-10-CM | POA: Diagnosis present

## 2016-06-29 DIAGNOSIS — Z794 Long term (current) use of insulin: Secondary | ICD-10-CM

## 2016-06-29 DIAGNOSIS — T464X5A Adverse effect of angiotensin-converting-enzyme inhibitors, initial encounter: Secondary | ICD-10-CM | POA: Diagnosis present

## 2016-06-29 DIAGNOSIS — E875 Hyperkalemia: Secondary | ICD-10-CM | POA: Diagnosis present

## 2016-06-29 DIAGNOSIS — F10231 Alcohol dependence with withdrawal delirium: Secondary | ICD-10-CM | POA: Diagnosis present

## 2016-06-29 DIAGNOSIS — S0990XA Unspecified injury of head, initial encounter: Secondary | ICD-10-CM | POA: Diagnosis not present

## 2016-06-29 DIAGNOSIS — Z79899 Other long term (current) drug therapy: Secondary | ICD-10-CM

## 2016-06-29 DIAGNOSIS — Z951 Presence of aortocoronary bypass graft: Secondary | ICD-10-CM

## 2016-06-29 DIAGNOSIS — Z87891 Personal history of nicotine dependence: Secondary | ICD-10-CM

## 2016-06-29 DIAGNOSIS — M109 Gout, unspecified: Secondary | ICD-10-CM | POA: Diagnosis present

## 2016-06-29 DIAGNOSIS — I2510 Atherosclerotic heart disease of native coronary artery without angina pectoris: Secondary | ICD-10-CM | POA: Diagnosis present

## 2016-06-29 DIAGNOSIS — E860 Dehydration: Secondary | ICD-10-CM | POA: Diagnosis present

## 2016-06-29 DIAGNOSIS — R05 Cough: Secondary | ICD-10-CM

## 2016-06-29 DIAGNOSIS — Z89612 Acquired absence of left leg above knee: Secondary | ICD-10-CM

## 2016-06-29 DIAGNOSIS — S199XXA Unspecified injury of neck, initial encounter: Secondary | ICD-10-CM | POA: Diagnosis not present

## 2016-06-29 DIAGNOSIS — Z809 Family history of malignant neoplasm, unspecified: Secondary | ICD-10-CM

## 2016-06-29 DIAGNOSIS — R402422 Glasgow coma scale score 9-12, at arrival to emergency department: Secondary | ICD-10-CM | POA: Diagnosis present

## 2016-06-29 DIAGNOSIS — Z9119 Patient's noncompliance with other medical treatment and regimen: Secondary | ICD-10-CM

## 2016-06-29 DIAGNOSIS — F17210 Nicotine dependence, cigarettes, uncomplicated: Secondary | ICD-10-CM | POA: Diagnosis present

## 2016-06-29 DIAGNOSIS — R41841 Cognitive communication deficit: Secondary | ICD-10-CM

## 2016-06-29 DIAGNOSIS — Z7982 Long term (current) use of aspirin: Secondary | ICD-10-CM

## 2016-06-29 DIAGNOSIS — E1122 Type 2 diabetes mellitus with diabetic chronic kidney disease: Secondary | ICD-10-CM | POA: Diagnosis present

## 2016-06-29 DIAGNOSIS — R402431 Glasgow coma scale score 3-8, in the field [EMT or ambulance]: Secondary | ICD-10-CM | POA: Diagnosis not present

## 2016-06-29 LAB — I-STAT CHEM 8, ED
BUN: 37 mg/dL — AB (ref 6–20)
CHLORIDE: 102 mmol/L (ref 101–111)
Calcium, Ion: 1.14 mmol/L — ABNORMAL LOW (ref 1.15–1.40)
Creatinine, Ser: 1.8 mg/dL — ABNORMAL HIGH (ref 0.61–1.24)
Glucose, Bld: 71 mg/dL (ref 65–99)
HEMATOCRIT: 44 % (ref 39.0–52.0)
Hemoglobin: 15 g/dL (ref 13.0–17.0)
Potassium: 6.2 mmol/L — ABNORMAL HIGH (ref 3.5–5.1)
SODIUM: 135 mmol/L (ref 135–145)
TCO2: 25 mmol/L (ref 0–100)

## 2016-06-29 LAB — COMPREHENSIVE METABOLIC PANEL
ALBUMIN: 3.4 g/dL — AB (ref 3.5–5.0)
ALT: 13 U/L — ABNORMAL LOW (ref 17–63)
ANION GAP: 5 (ref 5–15)
AST: 18 U/L (ref 15–41)
Alkaline Phosphatase: 44 U/L (ref 38–126)
BUN: 34 mg/dL — ABNORMAL HIGH (ref 6–20)
CHLORIDE: 101 mmol/L (ref 101–111)
CO2: 23 mmol/L (ref 22–32)
Calcium: 8.4 mg/dL — ABNORMAL LOW (ref 8.9–10.3)
Creatinine, Ser: 2.01 mg/dL — ABNORMAL HIGH (ref 0.61–1.24)
GFR calc non Af Amer: 31 mL/min — ABNORMAL LOW (ref >60)
GFR, EST AFRICAN AMERICAN: 36 mL/min — AB (ref >60)
GLUCOSE: 175 mg/dL — AB (ref 65–99)
POTASSIUM: 6.5 mmol/L — AB (ref 3.5–5.1)
SODIUM: 129 mmol/L — AB (ref 135–145)
Total Bilirubin: 0.5 mg/dL (ref 0.3–1.2)
Total Protein: 6.7 g/dL (ref 6.5–8.1)

## 2016-06-29 LAB — CBC WITH DIFFERENTIAL/PLATELET
BASOS PCT: 0 %
Basophils Absolute: 0 10*3/uL (ref 0.0–0.1)
EOS ABS: 0.1 10*3/uL (ref 0.0–0.7)
EOS PCT: 1 %
HCT: 43.1 % (ref 39.0–52.0)
HEMOGLOBIN: 13.4 g/dL (ref 13.0–17.0)
LYMPHS ABS: 1.7 10*3/uL (ref 0.7–4.0)
Lymphocytes Relative: 26 %
MCH: 30.9 pg (ref 26.0–34.0)
MCHC: 31.1 g/dL (ref 30.0–36.0)
MCV: 99.5 fL (ref 78.0–100.0)
MONOS PCT: 11 %
Monocytes Absolute: 0.7 10*3/uL (ref 0.1–1.0)
NEUTROS PCT: 62 %
Neutro Abs: 4.2 10*3/uL (ref 1.7–7.7)
PLATELETS: 182 10*3/uL (ref 150–400)
RBC: 4.33 MIL/uL (ref 4.22–5.81)
RDW: 16.1 % — ABNORMAL HIGH (ref 11.5–15.5)
WBC: 6.6 10*3/uL (ref 4.0–10.5)

## 2016-06-29 LAB — BRAIN NATRIURETIC PEPTIDE: B Natriuretic Peptide: 185 pg/mL — ABNORMAL HIGH (ref 0.0–100.0)

## 2016-06-29 LAB — URINALYSIS, ROUTINE W REFLEX MICROSCOPIC
Bilirubin Urine: NEGATIVE
Glucose, UA: NEGATIVE mg/dL
Ketones, ur: NEGATIVE mg/dL
Leukocytes, UA: NEGATIVE
NITRITE: NEGATIVE
PROTEIN: NEGATIVE mg/dL
SPECIFIC GRAVITY, URINE: 1.025 (ref 1.005–1.030)
pH: 5.5 (ref 5.0–8.0)

## 2016-06-29 LAB — GLUCOSE, CAPILLARY
GLUCOSE-CAPILLARY: 166 mg/dL — AB (ref 65–99)
GLUCOSE-CAPILLARY: 257 mg/dL — AB (ref 65–99)

## 2016-06-29 LAB — I-STAT TROPONIN, ED: TROPONIN I, POC: 0.07 ng/mL (ref 0.00–0.08)

## 2016-06-29 LAB — RAPID URINE DRUG SCREEN, HOSP PERFORMED
AMPHETAMINES: NOT DETECTED
BARBITURATES: NOT DETECTED
BENZODIAZEPINES: POSITIVE — AB
COCAINE: NOT DETECTED
Opiates: POSITIVE — AB
TETRAHYDROCANNABINOL: NOT DETECTED

## 2016-06-29 LAB — I-STAT CG4 LACTIC ACID, ED
LACTIC ACID, VENOUS: 1.3 mmol/L (ref 0.5–1.9)
Lactic Acid, Venous: 0.69 mmol/L (ref 0.5–1.9)

## 2016-06-29 LAB — BLOOD GAS, VENOUS
Acid-base deficit: 4.8 mmol/L — ABNORMAL HIGH (ref 0.0–2.0)
BICARBONATE: 19.2 mmol/L — AB (ref 20.0–28.0)
O2 CONTENT: 2 L/min
O2 SAT: 83.7 %
PO2 VEN: 50.9 mmHg — AB (ref 32.0–45.0)
pCO2, Ven: 53.1 mmHg (ref 44.0–60.0)
pH, Ven: 7.232 — ABNORMAL LOW (ref 7.250–7.430)

## 2016-06-29 LAB — URINE MICROSCOPIC-ADD ON
Squamous Epithelial / LPF: NONE SEEN
WBC, UA: NONE SEEN WBC/hpf (ref 0–5)

## 2016-06-29 LAB — ACETAMINOPHEN LEVEL

## 2016-06-29 LAB — PROTIME-INR
INR: 0.93
PROTHROMBIN TIME: 12.5 s (ref 11.4–15.2)

## 2016-06-29 LAB — CBG MONITORING, ED
GLUCOSE-CAPILLARY: 51 mg/dL — AB (ref 65–99)
GLUCOSE-CAPILLARY: 87 mg/dL (ref 65–99)
Glucose-Capillary: 66 mg/dL (ref 65–99)

## 2016-06-29 LAB — TSH: TSH: 3.33 u[IU]/mL (ref 0.350–4.500)

## 2016-06-29 LAB — ETHANOL: Alcohol, Ethyl (B): 8 mg/dL — ABNORMAL HIGH (ref <5)

## 2016-06-29 LAB — SALICYLATE LEVEL: Salicylate Lvl: <7 mg/dL (ref 2.8–30.0)

## 2016-06-29 MED ORDER — SODIUM CHLORIDE 0.9 % IV SOLN
INTRAVENOUS | Status: DC
  Administered 2016-06-29: 17:00:00 via INTRAVENOUS

## 2016-06-29 MED ORDER — ALBUTEROL SULFATE (2.5 MG/3ML) 0.083% IN NEBU
10.0000 mg | INHALATION_SOLUTION | Freq: Once | RESPIRATORY_TRACT | Status: AC
  Administered 2016-06-29: 10 mg via RESPIRATORY_TRACT
  Filled 2016-06-29: qty 12

## 2016-06-29 MED ORDER — INSULIN ASPART 100 UNIT/ML ~~LOC~~ SOLN
0.0000 [IU] | Freq: Three times a day (TID) | SUBCUTANEOUS | Status: DC
  Administered 2016-06-29: 2 [IU] via SUBCUTANEOUS
  Administered 2016-06-30: 3 [IU] via SUBCUTANEOUS
  Administered 2016-07-01: 1 [IU] via SUBCUTANEOUS

## 2016-06-29 MED ORDER — HEPARIN SODIUM (PORCINE) 5000 UNIT/ML IJ SOLN
5000.0000 [IU] | Freq: Three times a day (TID) | INTRAMUSCULAR | Status: DC
  Administered 2016-06-29 – 2016-07-02 (×7): 5000 [IU] via SUBCUTANEOUS
  Filled 2016-06-29 (×7): qty 1

## 2016-06-29 MED ORDER — IPRATROPIUM-ALBUTEROL 0.5-2.5 (3) MG/3ML IN SOLN: 3.0000 mL | Freq: Once | RESPIRATORY_TRACT | Status: DC

## 2016-06-29 MED ORDER — ONDANSETRON HCL 4 MG/2ML IJ SOLN
4.0000 mg | Freq: Four times a day (QID) | INTRAMUSCULAR | Status: DC | PRN
Start: 2016-06-29 — End: 2016-07-02

## 2016-06-29 MED ORDER — SODIUM CHLORIDE 0.9% FLUSH
3.0000 mL | Freq: Two times a day (BID) | INTRAVENOUS | Status: DC
  Administered 2016-06-29 – 2016-07-02 (×7): 3 mL via INTRAVENOUS

## 2016-06-29 MED ORDER — ACETAMINOPHEN 325 MG PO TABS
650.0000 mg | ORAL_TABLET | Freq: Four times a day (QID) | ORAL | Status: DC | PRN
  Administered 2016-06-30: 650 mg via ORAL
  Filled 2016-06-29: qty 2

## 2016-06-29 MED ORDER — SODIUM CHLORIDE 0.9 % IV BOLUS (SEPSIS)
1000.0000 mL | Freq: Once | INTRAVENOUS | Status: AC
  Administered 2016-06-29: 1000 mL via INTRAVENOUS

## 2016-06-29 MED ORDER — DEXTROSE 50 % IV SOLN
INTRAVENOUS | Status: AC
  Administered 2016-06-29: 50 mL
  Filled 2016-06-29: qty 50

## 2016-06-29 MED ORDER — SODIUM POLYSTYRENE SULFONATE 15 GM/60ML PO SUSP
30.0000 g | Freq: Once | ORAL | Status: AC
  Administered 2016-06-29: 30 g via ORAL
  Filled 2016-06-29: qty 120

## 2016-06-29 MED ORDER — ONDANSETRON HCL 4 MG PO TABS: 4.0000 mg | ORAL_TABLET | Freq: Four times a day (QID) | ORAL | Status: DC | PRN

## 2016-06-29 MED ORDER — TRAZODONE HCL 50 MG PO TABS
50.0000 mg | ORAL_TABLET | Freq: Every day | ORAL | Status: DC
  Administered 2016-06-29: 50 mg via ORAL
  Filled 2016-06-29: qty 1

## 2016-06-29 MED ORDER — HYDROCODONE-ACETAMINOPHEN 5-325 MG PO TABS
1.0000 | ORAL_TABLET | ORAL | Status: DC | PRN
  Administered 2016-06-29 – 2016-06-30 (×2): 2 via ORAL
  Filled 2016-06-29 (×2): qty 2

## 2016-06-29 MED ORDER — ALPRAZOLAM 0.5 MG PO TABS
0.5000 mg | ORAL_TABLET | Freq: Three times a day (TID) | ORAL | Status: DC | PRN
  Administered 2016-06-29 – 2016-06-30 (×3): 0.5 mg via ORAL
  Filled 2016-06-29 (×3): qty 1

## 2016-06-29 MED ORDER — DEXTROSE 5 % IV SOLN
Freq: Once | INTRAVENOUS | Status: AC
  Administered 2016-06-29: 11:00:00 via INTRAVENOUS

## 2016-06-29 MED ORDER — ACETAMINOPHEN 650 MG RE SUPP: 650.0000 mg | Freq: Four times a day (QID) | RECTAL | Status: DC | PRN

## 2016-06-29 MED ORDER — FUROSEMIDE 10 MG/ML IJ SOLN
40.0000 mg | Freq: Three times a day (TID) | INTRAMUSCULAR | Status: DC
Start: 2016-06-29 — End: 2016-06-30
  Administered 2016-06-29 – 2016-06-30 (×3): 40 mg via INTRAVENOUS
  Filled 2016-06-29 (×3): qty 4

## 2016-06-29 MED ORDER — METHYLPREDNISOLONE SODIUM SUCC 125 MG IJ SOLR
125.0000 mg | Freq: Once | INTRAMUSCULAR | Status: AC
  Administered 2016-06-29: 125 mg via INTRAVENOUS
  Filled 2016-06-29: qty 2

## 2016-06-29 MED ORDER — HALOPERIDOL LACTATE 5 MG/ML IJ SOLN
5.0000 mg | Freq: Once | INTRAMUSCULAR | Status: AC
  Administered 2016-06-30: 5 mg via INTRAVENOUS
  Filled 2016-06-29: qty 1

## 2016-06-29 NOTE — ED Notes (Addendum)
CRITICAL VALUE ALERT  Critical value received:  Potassium 6.5  Date of notification:  06/29/16  Time of notification:  0923  Critical value read back:Yes.    Nurse who received alert:  Charmayne Sheer, RN  MD notified (1st page):  Oleta Mouse

## 2016-06-29 NOTE — Progress Notes (Signed)
Pt states he is not going to stay in the hospital. His wife has been called and states she will come and pick him up. Pt has signed Against Medical Advice form. Doctor has been text paged.

## 2016-06-29 NOTE — ED Notes (Signed)
Patient's wife is at bedside

## 2016-06-29 NOTE — ED Notes (Signed)
CRITICAL VALUE POTASSIUM 6.5

## 2016-06-29 NOTE — ED Notes (Signed)
CBG ran with I-stat chem 8, resulted at 71

## 2016-06-29 NOTE — ED Triage Notes (Signed)
Patient's wife states that he is on amoxicillin 2 X per day, he fell on Monday.

## 2016-06-29 NOTE — H&P (Signed)
History and Physical    [name] [id number] DOB: 1941/08/14 DOA: 06/29/2016  PCP: Purvis Kilts, MD  Patient coming from: Home  Chief Complaint: Somnolence  HPI: [name] is a 75 y.o. male with medical history significant of COPD, diabetes mellitus type 2, CAD, status post left AKA and CKD stage III came into the hospital because of altered mental status. Apparently patient was admitted to the hospital about 2 weeks ago for same complaints and he left AMA. Patient was in his usual state of health until last night, according to the ED physician his wife couldn't wake him up today so she called 911 and reported to the hospital.  ED Course:  Vitals: Within normal limits Labs: WNL Imaging: CT of the head and C-spine showed no acute findings, foot x-ray showed osteoporosis Interventions: Considered for admission.  Review of Systems:  Constitutional: negative for anorexia, fevers and sweats Eyes: negative for irritation, redness and visual disturbance Ears, nose, mouth, throat, and face: negative for earaches, epistaxis, nasal congestion and sore throat Respiratory: negative for cough, dyspnea on exertion, sputum and wheezing Cardiovascular: negative for chest pain, dyspnea, lower extremity edema, orthopnea, palpitations and syncope Gastrointestinal: negative for abdominal pain, constipation, diarrhea, melena, nausea and vomiting Genitourinary:negative for dysuria, frequency and hematuria Hematologic/lymphatic: negative for bleeding, easy bruising and lymphadenopathy Musculoskeletal:negative for arthralgias, muscle weakness and stiff joints Neurological: negative for coordination problems, gait problems, headaches and weakness Endocrine: negative for diabetic symptoms including polydipsia, polyuria and weight loss Allergic/Immunologic: negative for anaphylaxis, hay fever and urticaria  Past Medical History:  Diagnosis Date  . Anginal pain ([place])   . Arthritis    "qwhere" (07/09/2013)  . Asthma   . COPD (chronic obstructive pulmonary disease) ([place])    pt denies this hx on 07/09/2013  . Coronary artery disease   . Gout   . H/O hiatal hernia 1997  . Heart murmur    "had rheumatic fever as a kid" (07/09/2013)  . High cholesterol   . Hypertension   . Myocardial infarction 03/2002; ~ 2011  . Pneumonia    "once" (07/09/2013)  . Renal disorder    "only have 1 kidney; due to go back soon to check the other one" (07/09/2013)  . Rheumatic fever   . RUPTURE ROTATOR CUFF 12/03/2009   Qualifier: Diagnosis of  By: Aline Brochure MD, Dorothyann Peng    . Shortness of breath    "can happen at anytime" (07/09/2013)  . Type II diabetes mellitus ([place])   . Ureter cancer [facility]) 12/2001   "shut down my kidney & resulted in nephrectomy" (07/09/2013)    Past Surgical History:  Procedure Laterality Date  . ABOVE KNEE LEG AMPUTATION Left 09/2009  . BELOW KNEE LEG AMPUTATION Left 08/2009  . CARDIAC CATHETERIZATION  ~ 2010   "before the bypass" (07/09/2013)  . CARPAL TUNNEL RELEASE Bilateral ~2004; ~ 2011  . CHOLECYSTECTOMY  1980's  . CORONARY ARTERY BYPASS GRAFT  ~ 2010   "CABG X5" (07/09/2013)  . EYE SURGERY    . FEMORAL ARTERY STENT Right 07/09/2013  . GLAUCOMA SURGERY Bilateral ?[id number]  . LOWER EXTREMITY ANGIOGRAM Right Nov. 4, 2014  . LOWER EXTREMITY ANGIOGRAM Right 07/09/2013   Procedure: LOWER EXTREMITY ANGIOGRAM;  Surgeon: Serafina Mitchell, MD;  Location[facility] C[facility] LAB;  Service: Cardiovascular;  Laterality: Right;  . NEPHRECTOMY Left 12/19/2001  . PERIPHERAL VASCULAR CATHETERIZATION N/A 02/03/2015   Procedure: Abdominal Aortogram;  Surgeon: Serafina Mitchell, MD;  Location: [facility] CV LAB;  Service: Cardiovascular;  Laterality: N/A;  . TRIGGER FINGER RELEASE Left ~ 1990's     reports that he has been smoking Cigarettes.  He has a 54.00 pack-year smoking history. He has never used smokeless tobacco. He reports that he drinks alcohol. He reports that he does not use  drugs.  Allergies  Allergen Reactions  . Erythromycin Anaphylaxis and Rash  . Iohexol Anaphylaxis, Hives, Itching and Other (See Comments)    Note:  13 HR PRE-MED GIVEN AND SUCCESSFUL-ARS 07/15/07.     Marland Kitchen Penicillins Anaphylaxis, Itching and Other (See Comments)    Has patient had a PCN reaction causing immediate rash, facial/tongue/throat swelling, SOB or lightheadedness with hypotension: Yes Has patient had a PCN reaction causing severe rash involving mucus membranes or skin necrosis: No Has patient had a PCN reaction that required hospitalization No Has patient had a PCN reaction occurring within the last 10 years: No If all of the above answers are "NO", then may proceed with Cephalosporin use.    Family History  Problem Relation Age of Onset  . Cancer Mother   . Heart attack Father     Prior to Admission medications   Medication Sig Start Date End Date Taking? Authorizing Provider  albuterol (PROVENTIL HFA;VENTOLIN HFA) 108 (90 Base) MCG/ACT inhaler Inhale 2 puffs into the lungs every 4 (four) hours as needed for wheezing or shortness of breath.   Yes Historical Provider, MD  albuterol (PROVENTIL) (2.5 MG/3ML) 0.083% nebulizer solution Take 2.5 mg by nebulization every 6 (six) hours as needed for wheezing or shortness of breath.   Yes Historical Provider, MD  aspirin EC 81 MG tablet Take 81 mg by mouth at bedtime.   Yes Historical Provider, MD  cholecalciferol (VITAMIN D) 400 units TABS tablet Take 1,200 Units by mouth 2 (two) times daily.   Yes Historical Provider, MD  doxycycline (VIBRA-TABS) 100 MG tablet Take 100 mg by mouth 2 (two) times daily.   Yes Historical Provider, MD  FLUoxetine (PROZAC) 10 MG capsule Take 10 mg by mouth daily.   Yes Historical Provider, MD  gabapentin (NEURONTIN) 300 MG capsule Take 600-900 mg by mouth 2 (two) times daily. Pt takes two capsules in the morning and three at bedtime.   Yes Historical Provider, MD  glimepiride (AMARYL) 4 MG tablet Take 4  mg by mouth daily with breakfast.   Yes Historical Provider, MD  Glycopyrrolate-Formoterol (BEVESPI IN) Inhale 1 puff into the lungs daily.    Yes Historical Provider, MD  Insulin Glargine (TOUJEO SOLOSTAR) 300 UNIT/ML SOPN Inject 10 Units into the skin daily as needed (for BS greater than 200).   Yes Historical Provider, MD  metFORMIN (GLUCOPHAGE) 500 MG tablet Take 500 mg by mouth 2 (two) times daily with a meal.   Yes Historical Provider, MD  mometasone-formoterol (DULERA) 200-5 MCG/ACT AERO Inhale 2 puffs into the lungs 2 (two) times daily.   Yes Historical Provider, MD  nitroGLYCERIN (NITROSTAT) 0.4 MG SL tablet Place 0.4 mg under the tongue every 5 (five) minutes as needed for chest pain.   Yes Historical Provider, MD  pioglitazone (ACTOS) 30 MG tablet Take 30 mg by mouth daily.   Yes Historical Provider, MD  ramipril (ALTACE) 10 MG capsule Take 10 mg by mouth daily.   Yes Historical Provider, MD  senna-docusate (SENOKOT-S) 8.6-50 MG tablet Take 1 tablet by mouth at bedtime as needed for mild constipation.   Yes Historical Provider, MD  Tamsulosin HCl (FLOMAX) 0.4 MG CAPS Take 0.4 mg  by mouth 2 (two) times daily.    Yes Historical Provider, MD  triazolam (HALCION) 0.25 MG tablet Take 0.25 mg by mouth at bedtime as needed for sleep.   Yes Historical Provider, MD  zolpidem (AMBIEN) 10 MG tablet Take 20 mg by mouth at bedtime.   Yes Historical Provider, MD    Physical Exam:  Vitals:   06/29/16 1130 06/29/16 1145 06/29/16 1200 06/29/16 1230  BP: (!) 151/118 115/70 127/82 103/59  Pulse: 107 110  107  Resp: 14 16 15 16   Temp:      TempSrc:      SpO2: 96% 99%  96%  Weight:      Height:        Constitutional: NAD, calm, comfortable Eyes: PERRL, lids and conjunctivae normal ENMT: Mucous membranes are moist. Posterior pharynx clear of any exudate or lesions.Normal dentition.  Neck: normal, supple, no masses, no thyromegaly Respiratory: clear to auscultation bilaterally, no wheezing, no  crackles. Normal respiratory effort. No accessory muscle use.  Cardiovascular: Regular rate and rhythm, no murmurs / rubs / gallops. No extremity edema. 2+ pedal pulses. No carotid bruits.  Abdomen: no tenderness, no masses palpated. No hepatosplenomegaly. Bowel sounds positive.  Musculoskeletal: no clubbing / cyanosis. No joint deformity upper and lower extremities. Good ROM, no contractures. Normal muscle tone.  Skin: no rashes, lesions, ulcers. No induration Neurologic: CN 2-12 grossly intact. Sensation intact, DTR normal. Strength 5/5 in all 4.  Psychiatric: Normal judgment and insight. Alert and oriented x 3. Normal mood.   Labs on Admission: I have personally reviewed following labs and imaging studies  CBC:  Recent Labs Lab 06/29/16 0850 06/29/16 1200  WBC 6.6  --   NEUTROABS 4.2  --   HGB 13.4 15.0  HCT 43.1 44.0  MCV 99.5  --   PLT 182  --    Basic Metabolic Panel:  Recent Labs Lab 06/29/16 0850 06/29/16 1200  NA 129* 135  K 6.5* 6.2*  CL 101 102  CO2 23  --   GLUCOSE 175* 71  BUN 34* 37*  CREATININE 2.01* 1.80*  CALCIUM 8.4*  --    GFR: Estimated Creatinine Clearance: 43.7 mL/min (by C-G formula based on SCr of 1.8 mg/dL (H)). Liver Function Tests:  Recent Labs Lab 06/29/16 0850  AST 18  ALT 13*  ALKPHOS 44  BILITOT 0.5  PROT 6.7  ALBUMIN 3.4*   No results for input(s): LIPASE, AMYLASE in the last 168 hours. No results for input(s): AMMONIA in the last 168 hours. Coagulation Profile: No results for input(s): INR, PROTIME in the last 168 hours. Cardiac Enzymes: No results for input(s): CKTOTAL, CKMB, CKMBINDEX, TROPONINI in the last 168 hours. BNP (last 3 results) No results for input(s): PROBNP in the last 8760 hours. HbA1C: No results for input(s): HGBA1C in the last 72 hours. CBG:  Recent Labs Lab 06/29/16 0842 06/29/16 0946 06/29/16 1029  GLUCAP 51* 87 66   Lipid Profile: No results for input(s): CHOL, HDL, LDLCALC, TRIG, CHOLHDL,  LDLDIRECT in the last 72 hours. Thyroid Function Tests: No results for input(s): TSH, T4TOTAL, FREET4, T3FREE, THYROIDAB in the last 72 hours. Anemia Panel: No results for input(s): VITAMINB12, FOLATE, FERRITIN, TIBC, IRON, RETICCTPCT in the last 72 hours. Urine analysis:    Component Value Date/Time   COLORURINE YELLOW 06/29/2016 [place] 06/29/2016 0850   LABSPEC 1.025 06/29/2016 0850   PHURINE 5.5 06/29/2016 0850   GLUCOSEU NEGATIVE 06/29/2016 0850   HGBUR TRACE (A)  06/29/2016 [place] 06/29/2016 [place] 06/29/2016 0850   PROTEINUR NEGATIVE 06/29/2016 0850   UROBILINOGEN 0.2 09/10/2014 1715   NITRITE NEGATIVE 06/29/2016 0850   LEUKOCYTESUR NEGATIVE 06/29/2016 0850   Sepsis Labs: !!!!!!!!!!!!!!!!!!!!!!!!!!!!!!!!!!!!!!!!!!!! Invalid input(s): PROCALCITONIN, LACTICIDVEN No results found for this or any previous visit (from the past 240 hour(s)).   Radiological Exams on Admission: Ct Head Wo Contrast  Result Date: 06/29/2016 CLINICAL DATA:  Fall EXAM: CT HEAD WITHOUT CONTRAST CT CERVICAL SPINE WITHOUT CONTRAST TECHNIQUE: Multidetector CT imaging of the head and cervical spine was performed following the standard protocol without intravenous contrast. Multiplanar CT image reconstructions of the cervical spine were also generated. COMPARISON:  06/16/2016, 09/16/2014 FINDINGS: CT HEAD FINDINGS Brain: Mild global atrophy. No mass effect, midline shift, or acute intracranial hemorrhage. Vascular: Atherosclerotic calcifications. Skull: Intact cranium. Sinuses/Orbits: Mastoid air cells clear. Visualized paranasal sinuses clear. Other: Adenoidal lymphoid tissue is mildly prominent. CT CERVICAL SPINE FINDINGS Alignment: Anatomic Skull base and vertebrae: No fracture or dislocation. Soft tissues and spinal canal: No soft tissue injury or obvious spinal hematoma. Disc levels: Left facet arthropathy at C2-3. Minimal posterior osteophytic ridging  at C6-7. Upper chest: Lung apices clear. Other: Thyroid is unremarkable. IMPRESSION: No acute intracranial pathology. No acute cervical spine injury. Electronically Signed   By: Marybelle Killings M.D.   On: 06/29/2016 10:23   Ct Cervical Spine Wo Contrast  Result Date: 06/29/2016 CLINICAL DATA:  Fall EXAM: CT HEAD WITHOUT CONTRAST CT CERVICAL SPINE WITHOUT CONTRAST TECHNIQUE: Multidetector CT imaging of the head and cervical spine was performed following the standard protocol without intravenous contrast. Multiplanar CT image reconstructions of the cervical spine were also generated. COMPARISON:  06/16/2016, 09/16/2014 FINDINGS: CT HEAD FINDINGS Brain: Mild global atrophy. No mass effect, midline shift, or acute intracranial hemorrhage. Vascular: Atherosclerotic calcifications. Skull: Intact cranium. Sinuses/Orbits: Mastoid air cells clear. Visualized paranasal sinuses clear. Other: Adenoidal lymphoid tissue is mildly prominent. CT CERVICAL SPINE FINDINGS Alignment: Anatomic Skull base and vertebrae: No fracture or dislocation. Soft tissues and spinal canal: No soft tissue injury or obvious spinal hematoma. Disc levels: Left facet arthropathy at C2-3. Minimal posterior osteophytic ridging at C6-7. Upper chest: Lung apices clear. Other: Thyroid is unremarkable. IMPRESSION: No acute intracranial pathology. No acute cervical spine injury. Electronically Signed   By: Marybelle Killings M.D.   On: 06/29/2016 10:23   Dg Chest Portable 1 View  Result Date: 06/29/2016 CLINICAL DATA:  Altered mental status. EXAM: PORTABLE CHEST 1 VIEW COMPARISON:  06/16/2016. FINDINGS: Cardiomegaly. Previous CABG. Low lung volumes with mild vascular congestion. No overt failure or infiltrates. No definite osseous findings. IMPRESSION: Cardiomegaly with low lung volumes and mild vascular congestion. No definite active infiltrates or failure. Worsening aeration from priors. Electronically Signed   By: Staci Righter M.D.   On: 06/29/2016 09:07    Dg Foot Complete Right  Result Date: 06/29/2016 CLINICAL DATA:  Chronic right foot pain. History of diabetes and gout. EXAM: RIGHT FOOT COMPLETE - 3+ VIEW COMPARISON:  05/26/2010 FINDINGS: Aggressive pattern of diffuse osteoporosis. There are moderate degenerative changes but no acute bony findings. Suspect prior healed fractures of the second and third proximal phalanges. Diffuse subcutaneous soft tissue swelling/ edema but no gas is seen in the soft tissues and no radiopaque foreign body is identified. There appears to be moderate thickening of the Achilles tendon. IMPRESSION: 1. Aggressive pattern of diffuse osteoporosis. 2. No acute bony findings or destructive bony changes. Moderate degenerative changes. 3. Suspect remote  healed fractures of the second and third proximal phalanges. Electronically Signed   By: Marijo Sanes M.D.   On: 06/29/2016 10:54    EKG: Independently reviewed.   Assessment/Plan Principal Problem:   Acute encephalopathy Active Problems:   Altered mental status   Diabetes mellitus with peripheral vascular disease (HCC)   Hyperkalemia   CKD (chronic kidney disease), stage III    Acute encephalopathy -Patient presented with somnolence, as mentioned above his wife couldn't wake him up this morning. -Likely multifactorial secondary to hypercapnia, medications including Neurontin, benzos and opioids. -Admit to the hospital for observation, will be on telemetry. -Hold sedating medications, CPAP at night. Patient is still confused, asked to let him go home, he cannot go home.  CKD stage III -Patient baseline creatinine around 1.5, came in with creatinine of 2.0. -Last 2-D echo showed ejection fraction of 60-65% with probable diastolic CHF. -Exam showed evidence of fluid overload, started on IV Lasix.  Hyperkalemia -Potassium 6.5, even Relates and Lasix, check BMP in a.m. Cannot rule out lisinopril as cause.  Diabetes mellitus type 2 -With peripheral vascular  disease complications including left AKA. -Started on SSI, held his Lantus, Glucophage and Actos, appears to be controlled, hemoglobin A1c in March 2017 was 6.5.  Hyponatremia -Could be dilutional hyponatremia, check BMP in a.m. after initiation of the diuresis.   DVT prophylaxis: SQ Heparin Code Status: Prior full code Family Communication: Plan D/W patient Disposition Plan: Home Consults called:  Admission status: Observation   Leyani Gargus A MD [facility] Pager [phone]  If 7PM-7AM, please contact night-coverage [email or web address] Password [email or web address]  06/29/2016, 1:12 PM

## 2016-06-29 NOTE — ED Provider Notes (Addendum)
[facility] DEPT Provider Note   CSN: [id number] Arrival date & time: 06/29/16  [id number]  By signing my name below, I, Higinio Plan, attest that this documentation has been prepared under the direction and in the presence of Forde Dandy, MD . Electronically Signed: Higinio Plan, Scribe. 06/29/2016. 3:42 PM.  History   Chief Complaint Chief Complaint  Patient presents with  . Altered Mental Status    Altered Mental Status     HPI Comments: Level V caveat due to AMS.  [name] is a 75 y.o. male with PMHx of 5 v CABG, COPD and Type II DM w/ left AKA, brought in by EMS to the Emergency Department for an evaluation of AMS. History provided by EMS. Lives with his wife, who called EMS because she could not wake him up appropriately today. Old records reviewed, he recently left AMA 06/16/2016 from hospital after COPD treatment. Noncompliant with CPAP at home. Initial POCt glucose 100, then 85 w/ EMS. Given breathing treatment without improvement. Given 0.5 mg narcan, with minimal improvement, but repeat 0.5 mg narcan w/o any changes in mental status by EMS. On arrival glucose 51. Patient mumbling incoherent words and unable to provide history. Per EMS also fallen out of wheelchair yesterday. Found old containers of valium in the house as well.  Past Medical History:  Diagnosis Date  . Anginal pain ([place])   . Arthritis    "qwhere" (07/09/2013)  . Asthma   . COPD (chronic obstructive pulmonary disease) ([place])    pt denies this hx on 07/09/2013  . Coronary artery disease   . Gout   . H/O hiatal hernia 1997  . Heart murmur    "had rheumatic fever as a kid" (07/09/2013)  . High cholesterol   . Hypertension   . Myocardial infarction 03/2002; ~ 2011  . Pneumonia    "once" (07/09/2013)  . Renal disorder    "only have 1 kidney; due to go back soon to check the other one" (07/09/2013)  . Rheumatic fever   . RUPTURE ROTATOR CUFF 12/03/2009   Qualifier: Diagnosis of  By: Aline Brochure MD, Dorothyann Peng    .  Shortness of breath    "can happen at anytime" (07/09/2013)  . Type II diabetes mellitus ([place])   . 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 CKD (chronic kidney disease), stage III 06/29/2016  . COPD exacerbation ([place]) 06/16/2016  . Hyperkalemia 11/24/2015  . UTI (lower urinary tract infection) 11/24/2015  . Acute UTI 09/11/2014  . Acute respiratory failure with hypercapnia ([place]) 09/10/2014  . Acute encephalopathy 09/10/2014  . Alcoholic dementia ([place]) [id number]  . Recurrent falls 09/10/2014  . Left shoulder pain 09/10/2014  . CAD in native artery 09/10/2014  . Diabetes mellitus with peripheral vascular disease ([place]) 09/10/2014  . FTT (failure to thrive) in adult 09/10/2014  . Protein-calorie malnutrition ([place]) 09/10/2014  . Mixed acid base balance disorder 09/09/2014  . Acute kidney injury ([place]) 09/09/2014  . Hypotension 09/09/2014  . Fall 08/27/2014  . Altered mental status 08/26/2014  . Dehydration 08/26/2014  . Alcohol abuse 08/26/2014  . Hypertension 08/26/2014  . Aftercare following surgery of the circulatory system, [place] 08/05/2013  . Leg pain 07/01/2013  . Peripheral vascular disease ([place]) 05/27/2013  . Peripheral angiopathy in diseases classified elsewhere ([place]) 05/02/2012  . Diabetes ([place]) 05/02/2012  . Pain in limb 05/02/2012  .  Chronic total occlusion of artery of the extremities ([place]) 05/02/2012  . JOINT EFFUSION, KNEE 12/03/2009  . RUPTURE ROTATOR CUFF 12/03/2009  . ISCHEMIC CARDIOMYOPATHY 08/24/2009  . CHF 08/24/2009  . BACK PAIN, LUMBAR, CHRONIC 08/24/2009    Past Surgical History:  Procedure Laterality Date  . ABOVE KNEE LEG AMPUTATION Left 09/2009  . BELOW KNEE LEG AMPUTATION Left 08/2009  . CARDIAC CATHETERIZATION  ~ 2010   "before the bypass" (07/09/2013)  . CARPAL TUNNEL RELEASE Bilateral ~2004; ~ 2011  . CHOLECYSTECTOMY  1980's  . CORONARY  ARTERY BYPASS GRAFT  ~ 2010   "CABG X5" (07/09/2013)  . EYE SURGERY    . FEMORAL ARTERY STENT Right 07/09/2013  . GLAUCOMA SURGERY Bilateral ?[id number]  . LOWER EXTREMITY ANGIOGRAM Right Nov. 4, 2014  . LOWER EXTREMITY ANGIOGRAM Right 07/09/2013   Procedure: LOWER EXTREMITY ANGIOGRAM;  Surgeon: Serafina Mitchell, MD;  Location: [facility] CATH LAB;  Service: Cardiovascular;  Laterality: Right;  . NEPHRECTOMY Left 12/19/2001  . PERIPHERAL VASCULAR CATHETERIZATION N/A 02/03/2015   Procedure: Abdominal Aortogram;  Surgeon: Serafina Mitchell, MD;  Location: [facility] CV LAB;  Service: Cardiovascular;  Laterality: N/A;  . TRIGGER FINGER RELEASE Left ~ 1990's    Home Medications    Prior to Admission medications   Medication Sig Start Date End Date Taking? Authorizing Provider  albuterol (PROVENTIL HFA;VENTOLIN HFA) 108 (90 Base) MCG/ACT inhaler Inhale 2 puffs into the lungs every 4 (four) hours as needed for wheezing or shortness of breath.   Yes Historical Provider, MD  albuterol (PROVENTIL) (2.5 MG/3ML) 0.083% nebulizer solution Take 2.5 mg by nebulization every 6 (six) hours as needed for wheezing or shortness of breath.   Yes Historical Provider, MD  aspirin EC 81 MG tablet Take 81 mg by mouth at bedtime.   Yes Historical Provider, MD  cholecalciferol (VITAMIN D) 400 units TABS tablet Take 1,200 Units by mouth 2 (two) times daily.   Yes Historical Provider, MD  doxycycline (VIBRA-TABS) 100 MG tablet Take 100 mg by mouth 2 (two) times daily.   Yes Historical Provider, MD  FLUoxetine (PROZAC) 10 MG capsule Take 10 mg by mouth daily.   Yes Historical Provider, MD  gabapentin (NEURONTIN) 300 MG capsule Take 600-900 mg by mouth 2 (two) times daily. Pt takes two capsules in the morning and three at bedtime.   Yes Historical Provider, MD  glimepiride (AMARYL) 4 MG tablet Take 4 mg by mouth daily with breakfast.   Yes Historical Provider, MD  Glycopyrrolate-Formoterol (BEVESPI IN) Inhale 1 puff into the lungs  daily.    Yes Historical Provider, MD  Insulin Glargine (TOUJEO SOLOSTAR) 300 UNIT/ML SOPN Inject 10 Units into the skin daily as needed (for BS greater than 200).   Yes Historical Provider, MD  metFORMIN (GLUCOPHAGE) 500 MG tablet Take 500 mg by mouth 2 (two) times daily with a meal.   Yes Historical Provider, MD  mometasone-formoterol (DULERA) 200-5 MCG/ACT AERO Inhale 2 puffs into the lungs 2 (two) times daily.   Yes Historical Provider, MD  nitroGLYCERIN (NITROSTAT) 0.4 MG SL tablet Place 0.4 mg under the tongue every 5 (five) minutes as needed for chest pain.   Yes Historical Provider, MD  pioglitazone (ACTOS) 30 MG tablet Take 30 mg by mouth daily.   Yes Historical Provider, MD  ramipril (ALTACE) 10 MG capsule Take 10 mg by mouth daily.   Yes Historical Provider, MD  senna-docusate (SENOKOT-S) 8.6-50 MG tablet Take 1 tablet by mouth at bedtime as needed  for mild constipation.   Yes Historical Provider, MD  Tamsulosin HCl (FLOMAX) 0.4 MG CAPS Take 0.4 mg by mouth 2 (two) times daily.    Yes Historical Provider, MD  triazolam (HALCION) 0.25 MG tablet Take 0.25 mg by mouth at bedtime as needed for sleep.   Yes Historical Provider, MD  zolpidem (AMBIEN) 10 MG tablet Take 20 mg by mouth at bedtime.   Yes Historical Provider, MD    Family History Family History  Problem Relation Age of Onset  . Cancer Mother   . Heart attack Father     Social History Social History  Substance Use Topics  . Smoking status: Current Every Day Smoker    Packs/day: 1.00    Years: 54.00    Types: Cigarettes  . Smokeless tobacco: Never Used  . Alcohol use 0.0 oz/week     Comment: social drinker     Allergies   Erythromycin; Iohexol; and Penicillins   Review of Systems Review of Systems Unable to obtain due to confusion  Physical Exam Updated Vital Signs BP 126/72   Pulse 103   Temp 98.8 F (37.1 C) (Oral)   Resp 14   Ht 6\' 3"  (1.905 m)   Wt 207 lb (93.9 kg)   SpO2 98%   BMI 25.87 kg/m    Physical Exam Physical Exam  Nursing note and vitals reviewed. Constitutional: Somnolent, incoherent speech, altered Head: Normocephalic and atraumatic.  Mouth/Throat: Oropharynx is clear. Significant secretions.  Neck: Normal range of motion. Neck supple.  Cardiovascular: Tachycardic rate and regular rhythm.   Pulmonary/Chest: Effort normal and breath sounds coarse throughout.  Abdominal: Soft. There is no tenderness. There is no rebound and no guarding.  Musculoskeletal: No deformity  Neurological: somnolent arouses to voice but incoherent in speech and does not follow commands, no facial droop,  moves all extremities symmetrically Skin: Skin is warm and dry.    ED Treatments / Results  Labs (all labs ordered are listed, but only abnormal results are displayed) Labs Reviewed  URINALYSIS, ROUTINE W REFLEX MICROSCOPIC (NOT AT [facility]) - Abnormal; Notable for the following:       Result Value   Hgb urine dipstick TRACE (*)    All other components within normal limits  CBC WITH DIFFERENTIAL/PLATELET - Abnormal; Notable for the following:    RDW 16.1 (*)    All other components within normal limits  COMPREHENSIVE METABOLIC PANEL - Abnormal; Notable for the following:    Sodium 129 (*)    Potassium 6.5 (*)    Glucose, Bld 175 (*)    BUN 34 (*)    Creatinine, Ser 2.01 (*)    Calcium 8.4 (*)    Albumin 3.4 (*)    ALT 13 (*)    GFR calc non Af Amer 31 (*)    GFR calc Af Amer 36 (*)    All other components within normal limits  RAPID URINE DRUG SCREEN, HOSP PERFORMED - Abnormal; Notable for the following:    Opiates POSITIVE (*)    Benzodiazepines POSITIVE (*)    All other components within normal limits  ACETAMINOPHEN LEVEL - Abnormal; Notable for the following:    Acetaminophen (Tylenol), Serum <10 (*)    All other components within normal limits  ETHANOL - Abnormal; Notable for the following:    Alcohol, Ethyl (B) 8 (*)    All other components within normal limits  BLOOD  GAS, VENOUS - Abnormal; Notable for the following:    pH, Ven 7.232 (*)  pO2, Ven 50.9 (*)    Bicarbonate 19.2 (*)    Acid-base deficit 4.8 (*)    All other components within normal limits  URINE MICROSCOPIC-ADD ON - Abnormal; Notable for the following:    Bacteria, UA FEW (*)    All other components within normal limits  BRAIN NATRIURETIC PEPTIDE - Abnormal; Notable for the following:    B Natriuretic Peptide 185.0 (*)    All other components within normal limits  CBG MONITORING, ED - Abnormal; Notable for the following:    Glucose-Capillary 51 (*)    All other components within normal limits  I-STAT CHEM 8, ED - Abnormal; Notable for the following:    Potassium 6.2 (*)    BUN 37 (*)    Creatinine, Ser 1.80 (*)    Calcium, Ion 1.14 (*)    All other components within normal limits  CULTURE, BLOOD (ROUTINE X 2)  CULTURE, BLOOD (ROUTINE X 2)  SALICYLATE LEVEL  PROTIME-INR  TSH  HEMOGLOBIN A1C  I-STAT CG4 LACTIC ACID, ED  I-STAT TROPOININ, ED  CBG MONITORING, ED  CBG MONITORING, ED  CBG MONITORING, ED  I-STAT CG4 LACTIC ACID, ED  CBG MONITORING, ED    EKG  EKG Interpretation  Date/Time:  Wednesday June 29 2016 08:43:51 EDT Ventricular Rate:  120 PR Interval:    QRS Duration: 115 QT Interval:  370 QTC Calculation: 523 R Axis:   88 Text Interpretation:  Sinus or ectopic atrial tachycardia Nonspecific intraventricular conduction delay Low voltage, extremity and precordial leads Probable anteroseptal infarct, old Minimal ST depression, lateral leads Sinus tachycardia Similar to last EKG  Confirmed by Mortimer Bair MD, Leilyn Frayre [phone]) on 06/29/2016 8:54:19 AM       Radiology Ct Head Wo Contrast  Result Date: 06/29/2016 CLINICAL DATA:  Fall EXAM: CT HEAD WITHOUT CONTRAST CT CERVICAL SPINE WITHOUT CONTRAST TECHNIQUE: Multidetector CT imaging of the head and cervical spine was performed following the standard protocol without intravenous contrast. Multiplanar CT image  reconstructions of the cervical spine were also generated. COMPARISON:  06/16/2016, 09/16/2014 FINDINGS: CT HEAD FINDINGS Brain: Mild global atrophy. No mass effect, midline shift, or acute intracranial hemorrhage. Vascular: Atherosclerotic calcifications. Skull: Intact cranium. Sinuses/Orbits: Mastoid air cells clear. Visualized paranasal sinuses clear. Other: Adenoidal lymphoid tissue is mildly prominent. CT CERVICAL SPINE FINDINGS Alignment: Anatomic Skull base and vertebrae: No fracture or dislocation. Soft tissues and spinal canal: No soft tissue injury or obvious spinal hematoma. Disc levels: Left facet arthropathy at C2-3. Minimal posterior osteophytic ridging at C6-7. Upper chest: Lung apices clear. Other: Thyroid is unremarkable. IMPRESSION: No acute intracranial pathology. No acute cervical spine injury. Electronically Signed   By: Marybelle Killings M.D.   On: 06/29/2016 10:23   Ct Cervical Spine Wo Contrast  Result Date: 06/29/2016 CLINICAL DATA:  Fall EXAM: CT HEAD WITHOUT CONTRAST CT CERVICAL SPINE WITHOUT CONTRAST TECHNIQUE: Multidetector CT imaging of the head and cervical spine was performed following the standard protocol without intravenous contrast. Multiplanar CT image reconstructions of the cervical spine were also generated. COMPARISON:  06/16/2016, 09/16/2014 FINDINGS: CT HEAD FINDINGS Brain: Mild global atrophy. No mass effect, midline shift, or acute intracranial hemorrhage. Vascular: Atherosclerotic calcifications. Skull: Intact cranium. Sinuses/Orbits: Mastoid air cells clear. Visualized paranasal sinuses clear. Other: Adenoidal lymphoid tissue is mildly prominent. CT CERVICAL SPINE FINDINGS Alignment: Anatomic Skull base and vertebrae: No fracture or dislocation. Soft tissues and spinal canal: No soft tissue injury or obvious spinal hematoma. Disc levels: Left facet arthropathy at C2-3. Minimal posterior osteophytic ridging  at C6-7. Upper chest: Lung apices clear. Other: Thyroid is  unremarkable. IMPRESSION: No acute intracranial pathology. No acute cervical spine injury. Electronically Signed   By: Marybelle Killings M.D.   On: 06/29/2016 10:23   Dg Chest Portable 1 View  Result Date: 06/29/2016 CLINICAL DATA:  Altered mental status. EXAM: PORTABLE CHEST 1 VIEW COMPARISON:  06/16/2016. FINDINGS: Cardiomegaly. Previous CABG. Low lung volumes with mild vascular congestion. No overt failure or infiltrates. No definite osseous findings. IMPRESSION: Cardiomegaly with low lung volumes and mild vascular congestion. No definite active infiltrates or failure. Worsening aeration from priors. Electronically Signed   By: Staci Righter M.D.   On: 06/29/2016 09:07   Dg Foot Complete Right  Result Date: 06/29/2016 CLINICAL DATA:  Chronic right foot pain. History of diabetes and gout. EXAM: RIGHT FOOT COMPLETE - 3+ VIEW COMPARISON:  05/26/2010 FINDINGS: Aggressive pattern of diffuse osteoporosis. There are moderate degenerative changes but no acute bony findings. Suspect prior healed fractures of the second and third proximal phalanges. Diffuse subcutaneous soft tissue swelling/ edema but no gas is seen in the soft tissues and no radiopaque foreign body is identified. There appears to be moderate thickening of the Achilles tendon. IMPRESSION: 1. Aggressive pattern of diffuse osteoporosis. 2. No acute bony findings or destructive bony changes. Moderate degenerative changes. 3. Suspect remote healed fractures of the second and third proximal phalanges. Electronically Signed   By: Marijo Sanes M.D.   On: 06/29/2016 10:54    Procedures Procedures (including critical care time) CRITICAL CARE Performed by: Forde Dandy   Total critical care time: 35  minutes  Critical care time was exclusive of separately billable procedures and treating other patients.  Critical care was necessary to treat or prevent imminent or life-threatening deterioration.  Critical care was time spent personally by me on  the following activities: management of altered mental status, development of treatment plan with patient and/or surrogate as well as nursing, evaluation of patient's response to treatment, examination of patient, obtaining history from patient or surrogate, ordering and performing treatments and interventions, ordering and review of laboratory studies, ordering and review of radiographic studies, pulse oximetry and re-evaluation of patient's condition.  Medications Ordered in ED Medications  heparin injection 5,000 Units (not administered)  sodium chloride flush (NS) 0.9 % injection 3 mL (not administered)  0.9 %  sodium chloride infusion (not administered)  acetaminophen (TYLENOL) tablet 650 mg (not administered)    Or  acetaminophen (TYLENOL) suppository 650 mg (not administered)  HYDROcodone-acetaminophen (NORCO/VICODIN) 5-325 MG per tablet 1-2 tablet (not administered)  ondansetron (ZOFRAN) tablet 4 mg (not administered)    Or  ondansetron (ZOFRAN) injection 4 mg (not administered)  insulin aspart (novoLOG) injection 0-9 Units (not administered)  furosemide (LASIX) injection 40 mg (not administered)  sodium polystyrene (KAYEXALATE) 15 GM/60ML suspension 30 g (not administered)  dextrose 50 % solution (50 mLs  Given 06/29/16 0845)  sodium chloride 0.9 % bolus 1,000 mL (0 mLs Intravenous Stopped 06/29/16 1040)  albuterol (PROVENTIL) (2.5 MG/3ML) 0.083% nebulizer solution 10 mg (10 mg Nebulization Given 06/29/16 1044)  dextrose 5 % solution ( Intravenous New Bag/Given 06/29/16 1050)  methylPREDNISolone sodium succinate (SOLU-MEDROL) 125 mg/2 mL injection 125 mg (125 mg Intravenous Given 06/29/16 1138)    DIAGNOSTIC STUDIES:  Oxygen Saturation is 97% on RA, normal by my interpretation.    COORDINATION OF CARE:  3:42 PM Discussed treatment plan with pt at bedside. Wife agreeable to plan.  Initial Impression / Assessment and Plan /  ED Course  I have reviewed the triage vital signs and  the nursing notes.  Pertinent labs & imaging results that were available during my care of the patient were reviewed by me and considered in my medical decision making (see chart for details).  Clinical Course    75 year old male who presents with AMS, multifactorial in nature. With initial GCS of 11, no focal neuro deficits noted on exam. With new oxygen requirement, wheezing and rhonchi on lung exam. Afebrile, mildly tachycardic but not hypotensive.   CT head and cervical spine negative for acute head or neck trauma in setting of recent fall. Blood work showing dehydration with AKI and hyperkalemia. No EKG changes.   AMS likely from combination of hypoglycemia (recurrent after treatment of D50 and placed on D5 drip subsequently). With hypoxic and hypercapnic respiratory failure. This is likely 2/2 CoPD exacerbation as well as noncompliance with CPAP at time and polypharmacy. UDS positive for benzos and opiates and he is on other sedating medications. He is given breathing treatments as well as steroids for potential COPD exacerbation. No evidence of sepsis. UA w/o infection and CXR without pneumonia.   On re-evaluation, with improving mental status. Is able to talk coherently, answering simple questions appropriately. Discussed with Dr. Hartford Poli who will admit to hospitalist service for ongoing management.   I personally performed the services described in this documentation, which was scribed in my presence. The recorded information has been reviewed and is accurate.   Final Clinical Impressions(s) / ED Diagnoses   Final diagnoses:  Transient alteration of awareness  Hypoglycemia  COPD exacerbation ([place])  Polypharmacy    New Prescriptions Current Discharge Medication List       Forde Dandy, MD 06/29/16 [place] Sixto Bowdish, MD 06/29/16 [phone]

## 2016-06-29 NOTE — ED Triage Notes (Signed)
75 yo from home and lives with wife presents with AMS.  Per EMS wife states that pt has not been himself since last night.  Grandchild could not get him to wake up this am before school.  Pt is snoring and blowing bubbles.  Pt has a hx of diabetes.  Per EMS wife states that he fell out of his wheelchair face first after doctor's appt yesterday.  He fell on his right foot and it is swollen.  Pt received 2 mg of narcan via EMS and was alert and oriented.  Pt was not oriented once he arrived.  EMS states that his 12 lead EKG showed a bundle branch block.

## 2016-06-30 DIAGNOSIS — E1122 Type 2 diabetes mellitus with diabetic chronic kidney disease: Secondary | ICD-10-CM | POA: Diagnosis present

## 2016-06-30 DIAGNOSIS — R4182 Altered mental status, unspecified: Secondary | ICD-10-CM | POA: Diagnosis present

## 2016-06-30 DIAGNOSIS — Z809 Family history of malignant neoplasm, unspecified: Secondary | ICD-10-CM | POA: Diagnosis not present

## 2016-06-30 DIAGNOSIS — F10231 Alcohol dependence with withdrawal delirium: Secondary | ICD-10-CM | POA: Diagnosis present

## 2016-06-30 DIAGNOSIS — Z87891 Personal history of nicotine dependence: Secondary | ICD-10-CM | POA: Diagnosis not present

## 2016-06-30 DIAGNOSIS — I252 Old myocardial infarction: Secondary | ICD-10-CM | POA: Diagnosis not present

## 2016-06-30 DIAGNOSIS — N183 Chronic kidney disease, stage 3 (moderate): Secondary | ICD-10-CM | POA: Diagnosis not present

## 2016-06-30 DIAGNOSIS — E860 Dehydration: Secondary | ICD-10-CM | POA: Diagnosis present

## 2016-06-30 DIAGNOSIS — E875 Hyperkalemia: Secondary | ICD-10-CM | POA: Diagnosis present

## 2016-06-30 DIAGNOSIS — J441 Chronic obstructive pulmonary disease with (acute) exacerbation: Secondary | ICD-10-CM | POA: Diagnosis present

## 2016-06-30 DIAGNOSIS — E871 Hypo-osmolality and hyponatremia: Secondary | ICD-10-CM | POA: Diagnosis present

## 2016-06-30 DIAGNOSIS — Z9119 Patient's noncompliance with other medical treatment and regimen: Secondary | ICD-10-CM | POA: Diagnosis not present

## 2016-06-30 DIAGNOSIS — E1151 Type 2 diabetes mellitus with diabetic peripheral angiopathy without gangrene: Secondary | ICD-10-CM | POA: Diagnosis not present

## 2016-06-30 DIAGNOSIS — N179 Acute kidney failure, unspecified: Secondary | ICD-10-CM | POA: Diagnosis present

## 2016-06-30 DIAGNOSIS — G9340 Encephalopathy, unspecified: Secondary | ICD-10-CM | POA: Diagnosis not present

## 2016-06-30 DIAGNOSIS — Z951 Presence of aortocoronary bypass graft: Secondary | ICD-10-CM | POA: Diagnosis not present

## 2016-06-30 DIAGNOSIS — I2510 Atherosclerotic heart disease of native coronary artery without angina pectoris: Secondary | ICD-10-CM | POA: Diagnosis present

## 2016-06-30 DIAGNOSIS — R404 Transient alteration of awareness: Secondary | ICD-10-CM | POA: Diagnosis not present

## 2016-06-30 DIAGNOSIS — Z89612 Acquired absence of left leg above knee: Secondary | ICD-10-CM | POA: Diagnosis not present

## 2016-06-30 DIAGNOSIS — R000 Tachycardia, unspecified: Secondary | ICD-10-CM | POA: Diagnosis not present

## 2016-06-30 DIAGNOSIS — Z7982 Long term (current) use of aspirin: Secondary | ICD-10-CM | POA: Diagnosis not present

## 2016-06-30 DIAGNOSIS — T464X5A Adverse effect of angiotensin-converting-enzyme inhibitors, initial encounter: Secondary | ICD-10-CM | POA: Diagnosis present

## 2016-06-30 DIAGNOSIS — I130 Hypertensive heart and chronic kidney disease with heart failure and stage 1 through stage 4 chronic kidney disease, or unspecified chronic kidney disease: Secondary | ICD-10-CM | POA: Diagnosis present

## 2016-06-30 DIAGNOSIS — R402422 Glasgow coma scale score 9-12, at arrival to emergency department: Secondary | ICD-10-CM | POA: Diagnosis present

## 2016-06-30 DIAGNOSIS — E11649 Type 2 diabetes mellitus with hypoglycemia without coma: Secondary | ICD-10-CM | POA: Diagnosis present

## 2016-06-30 DIAGNOSIS — Z794 Long term (current) use of insulin: Secondary | ICD-10-CM | POA: Diagnosis not present

## 2016-06-30 DIAGNOSIS — F0280 Dementia in other diseases classified elsewhere without behavioral disturbance: Secondary | ICD-10-CM | POA: Diagnosis present

## 2016-06-30 LAB — BASIC METABOLIC PANEL
Anion gap: 11 (ref 5–15)
BUN: 35 mg/dL — AB (ref 6–20)
CALCIUM: 8.8 mg/dL — AB (ref 8.9–10.3)
CO2: 21 mmol/L — ABNORMAL LOW (ref 22–32)
CREATININE: 1.55 mg/dL — AB (ref 0.61–1.24)
Chloride: 101 mmol/L (ref 101–111)
GFR calc non Af Amer: 43 mL/min — ABNORMAL LOW (ref >60)
GFR, EST AFRICAN AMERICAN: 50 mL/min — AB (ref >60)
Glucose, Bld: 217 mg/dL — ABNORMAL HIGH (ref 65–99)
Potassium: 4.9 mmol/L (ref 3.5–5.1)
SODIUM: 133 mmol/L — AB (ref 135–145)

## 2016-06-30 LAB — HEMOGLOBIN A1C
HEMOGLOBIN A1C: 6.2 % — AB (ref 4.8–5.6)
Mean Plasma Glucose: 131 mg/dL

## 2016-06-30 LAB — CBC
HCT: 42.8 % (ref 39.0–52.0)
Hemoglobin: 13.8 g/dL (ref 13.0–17.0)
MCH: 30.4 pg (ref 26.0–34.0)
MCHC: 32.2 g/dL (ref 30.0–36.0)
MCV: 94.3 fL (ref 78.0–100.0)
PLATELETS: 193 10*3/uL (ref 150–400)
RBC: 4.54 MIL/uL (ref 4.22–5.81)
RDW: 15 % (ref 11.5–15.5)
WBC: 4.9 10*3/uL (ref 4.0–10.5)

## 2016-06-30 LAB — GLUCOSE, CAPILLARY
GLUCOSE-CAPILLARY: 101 mg/dL — AB (ref 65–99)
Glucose-Capillary: 215 mg/dL — ABNORMAL HIGH (ref 65–99)
Glucose-Capillary: 74 mg/dL (ref 65–99)
Glucose-Capillary: 116 mg/dL — ABNORMAL HIGH (ref 65–99)
Glucose-Capillary: 118 mg/dL — ABNORMAL HIGH (ref 65–99)

## 2016-06-30 MED ORDER — QUETIAPINE FUMARATE ER 50 MG PO TB24: 50.0000 mg | ORAL_TABLET | Freq: Every day | ORAL | Status: DC

## 2016-06-30 MED ORDER — FUROSEMIDE 40 MG PO TABS
40.0000 mg | ORAL_TABLET | Freq: Every day | ORAL | Status: DC
  Administered 2016-07-02: 40 mg via ORAL
  Filled 2016-06-30 (×2): qty 1

## 2016-06-30 MED ORDER — INSULIN GLARGINE 100 UNIT/ML ~~LOC~~ SOLN
10.0000 [IU] | Freq: Every day | SUBCUTANEOUS | Status: DC
  Filled 2016-06-30 (×5): qty 0.1

## 2016-06-30 MED ORDER — HALOPERIDOL LACTATE 5 MG/ML IJ SOLN
10.0000 mg | Freq: Four times a day (QID) | INTRAMUSCULAR | Status: DC | PRN
  Administered 2016-06-30: 10 mg via INTRAVENOUS
  Filled 2016-06-30: qty 2

## 2016-06-30 MED ORDER — LORAZEPAM 1 MG PO TABS
1.0000 mg | ORAL_TABLET | Freq: Once | ORAL | Status: AC
  Administered 2016-06-30: 1 mg via ORAL
  Filled 2016-06-30: qty 1

## 2016-06-30 MED ORDER — HALOPERIDOL LACTATE 5 MG/ML IJ SOLN
5.0000 mg | Freq: Four times a day (QID) | INTRAMUSCULAR | Status: DC | PRN
  Administered 2016-06-30 – 2016-07-02 (×2): 5 mg via INTRAVENOUS
  Filled 2016-06-30 (×4): qty 1

## 2016-06-30 MED ORDER — HALOPERIDOL LACTATE 5 MG/ML IJ SOLN
5.0000 mg | Freq: Once | INTRAMUSCULAR | Status: AC
  Administered 2016-06-30: 5 mg via INTRAVENOUS

## 2016-06-30 MED ORDER — QUETIAPINE FUMARATE 25 MG PO TABS
50.0000 mg | ORAL_TABLET | Freq: Every day | ORAL | Status: DC
  Administered 2016-06-30: 50 mg via ORAL
  Filled 2016-06-30: qty 2

## 2016-06-30 NOTE — Progress Notes (Signed)
Pt refuses to wear CPAP, no unit in the room. RT will continue to monitor.

## 2016-06-30 NOTE — Progress Notes (Signed)
PROGRESS NOTE  [name]  [id number] DOB: 10/29/1940 DOA: 06/29/2016 PCP: Purvis Kilts, MD Outpatient Specialists:  Subjective: Patient is awake and alert but confused and disoriented. Likely has early dementia. Reportedly yesterday wanted to go home, patient cannot leave AMA as he is disoriented.  Brief Narrative:  [name] is a 75 y.o. male with medical history significant of COPD, diabetes mellitus type 2, CAD, status post left AKA and CKD stage III came into the hospital because of altered mental status. Apparently patient was admitted to the hospital about 2 weeks ago for same complaints and he left AMA. Patient was in his usual state of health until last night, according to the ED physician his wife couldn't wake him up today so she called 911 and reported to the hospital.  Assessment & Plan:   Principal Problem:   Acute encephalopathy Active Problems:   Altered mental status   Diabetes mellitus with peripheral vascular disease (HCC)   Hyperkalemia   CKD (chronic kidney disease), stage III   Acute encephalopathy -Patient presented with somnolence, as mentioned above his wife couldn't wake him up this morning. -Likely multifactorial secondary to hypercapnia, medications including Neurontin. -Although his toxicity screen showed benzos and opioids he is not on these medications at home. -Likely acute delirium on top of dementia secondary to medications and hypercarbia. -Started on Seroquel at night.  CKD stage III -Patient baseline creatinine around 1.5, came in with creatinine of 2.0. -Last 2-D echo showed ejection fraction of 60-65% with probable diastolic CHF. -Exam showed evidence of fluid overload, started on IV Lasix, creatinine improved to 1.5, change diuresis to oral Lasix.  Hyperkalemia -Potassium 6.5, likely secondary to lisinopril, received Kayexalate and Lasix, potassium is 4.9 today.  Diabetes mellitus type 2 -With peripheral vascular  disease complications including left AKA. -Started on SSI, held his Lantus, Glucophage and Actos, appears to be controlled, hemoglobin A1c in March 2017 was 6.5.  Hyponatremia -Sodium 129 on admission, improved to 133 after starting diuresis.   DVT prophylaxis: Subcutaneous heparin Code Status: Full Code Family Communication:  Disposition Plan:  Diet: Diet Carb Modified Fluid consistency: Thin; Room service appropriate? Yes  Consultants:   None  Procedures:   None  Antimicrobials:   none   Objective: Vitals:   06/29/16 1359 06/29/16 1400 06/29/16 2200 06/30/16 0612  BP: 109/67 126/72 128/88 (!) 148/73  Pulse: 103 103 95 (!) 103  Resp: 16 14 16 16   Temp:   98.3 F (36.8 C) 97.6 F (36.4 C)  TempSrc:   Oral Oral  SpO2: 98% 98% 96% 97%  Weight:      Height:        Intake/Output Summary (Last 24 hours) at 06/30/16 1111 Last data filed at 06/29/16 1800  Gross per 24 hour  Intake                0 ml  Output             1100 ml  Net            -1100 ml   Filed Weights   06/29/16 0836  Weight: 93.9 kg (207 lb)    Examination: General exam: Appears calm and comfortable  Respiratory system: Clear to auscultation. Respiratory effort normal. Cardiovascular system: S1 & S2 heard, RRR. No JVD, murmurs, rubs, gallops or clicks. No pedal edema. Gastrointestinal system: Abdomen is nondistended, soft and nontender. No organomegaly or masses felt. Normal bowel sounds heard. Central nervous system: Alert  and oriented. No focal neurological deficits. Extremities: Symmetric 5 x 5 power. Skin: No rashes, lesions or ulcers Psychiatry: Judgement and insight appear normal. Mood & affect appropriate.   Data Reviewed: I have personally reviewed following labs and imaging studies  CBC:  Recent Labs Lab 06/29/16 0850 06/29/16 1200 06/30/16 0610  WBC 6.6  --  4.9  NEUTROABS 4.2  --   --   HGB 13.4 15.0 13.8  HCT 43.1 44.0 42.8  MCV 99.5  --  94.3  PLT 182  --  0000000    Basic Metabolic Panel:  Recent Labs Lab 06/29/16 0850 06/29/16 1200 06/30/16 0610  NA 129* 135 133*  K 6.5* 6.2* 4.9  CL 101 102 101  CO2 23  --  21*  GLUCOSE 175* 71 217*  BUN 34* 37* 35*  CREATININE 2.01* 1.80* 1.55*  CALCIUM 8.4*  --  8.8*   GFR: Estimated Creatinine Clearance: 50.7 mL/min (by C-G formula based on SCr of 1.55 mg/dL (H)). Liver Function Tests:  Recent Labs Lab 06/29/16 0850  AST 18  ALT 13*  ALKPHOS 44  BILITOT 0.5  PROT 6.7  ALBUMIN 3.4*   No results for input(s): LIPASE, AMYLASE in the last 168 hours. No results for input(s): AMMONIA in the last 168 hours. Coagulation Profile:  Recent Labs Lab 06/29/16 0852  INR 0.93   Cardiac Enzymes: No results for input(s): CKTOTAL, CKMB, CKMBINDEX, TROPONINI in the last 168 hours. BNP (last 3 results) No results for input(s): PROBNP in the last 8760 hours. HbA1C:  Recent Labs  06/29/16 0850  HGBA1C 6.2*   CBG:  Recent Labs Lab 06/29/16 0946 06/29/16 1029 06/29/16 1657 06/29/16 2218 06/30/16 0900  GLUCAP 87 66 166* 257* 215*   Lipid Profile: No results for input(s): CHOL, HDL, LDLCALC, TRIG, CHOLHDL, LDLDIRECT in the last 72 hours. Thyroid Function Tests:  Recent Labs  06/29/16 0850  TSH 3.330   Anemia Panel: No results for input(s): VITAMINB12, FOLATE, FERRITIN, TIBC, IRON, RETICCTPCT in the last 72 hours. Urine analysis:    Component Value Date/Time   COLORURINE YELLOW 06/29/2016 [place] 06/29/2016 0850   LABSPEC 1.025 06/29/2016 0850   PHURINE 5.5 06/29/2016 0850   GLUCOSEU NEGATIVE 06/29/2016 0850   HGBUR TRACE (A) 06/29/2016 0850   BILIRUBINUR NEGATIVE 06/29/2016 0850   KETONESUR NEGATIVE 06/29/2016 0850   PROTEINUR NEGATIVE 06/29/2016 0850   UROBILINOGEN 0.2 09/10/2014 1715   NITRITE NEGATIVE 06/29/2016 0850   LEUKOCYTESUR NEGATIVE 06/29/2016 0850   Sepsis Labs: @LABRCNTIP (procalcitonin:4,lacticidven:4)  ) Recent Results (from the past 240  hour(s))  Blood culture (routine x 2)     Status: None (Preliminary result)   Collection Time: 06/29/16 10:42 AM  Result Value Ref Range Status   Specimen Description BLOOD  Final   Special Requests NONE  Final   Culture NO GROWTH < 24 HOURS  Final   Report Status PENDING  Incomplete  Blood culture (routine x 2)     Status: None (Preliminary result)   Collection Time: 06/29/16 10:49 AM  Result Value Ref Range Status   Specimen Description BLOOD  Final   Special Requests NONE  Final   Culture NO GROWTH < 24 HOURS  Final   Report Status PENDING  Incomplete     Invalid input(s): PROCALCITONIN, LACTICACIDVEN   Radiology Studies: Ct Head Wo Contrast  Result Date: 06/29/2016 CLINICAL DATA:  Fall EXAM: CT HEAD WITHOUT CONTRAST CT CERVICAL SPINE WITHOUT CONTRAST TECHNIQUE: Multidetector CT imaging of the head  and cervical spine was performed following the standard protocol without intravenous contrast. Multiplanar CT image reconstructions of the cervical spine were also generated. COMPARISON:  06/16/2016, 09/16/2014 FINDINGS: CT HEAD FINDINGS Brain: Mild global atrophy. No mass effect, midline shift, or acute intracranial hemorrhage. Vascular: Atherosclerotic calcifications. Skull: Intact cranium. Sinuses/Orbits: Mastoid air cells clear. Visualized paranasal sinuses clear. Other: Adenoidal lymphoid tissue is mildly prominent. CT CERVICAL SPINE FINDINGS Alignment: Anatomic Skull base and vertebrae: No fracture or dislocation. Soft tissues and spinal canal: No soft tissue injury or obvious spinal hematoma. Disc levels: Left facet arthropathy at C2-3. Minimal posterior osteophytic ridging at C6-7. Upper chest: Lung apices clear. Other: Thyroid is unremarkable. IMPRESSION: No acute intracranial pathology. No acute cervical spine injury. Electronically Signed   By: Marybelle Killings M.D.   On: 06/29/2016 10:23   Ct Cervical Spine Wo Contrast  Result Date: 06/29/2016 CLINICAL DATA:  Fall EXAM: CT HEAD  WITHOUT CONTRAST CT CERVICAL SPINE WITHOUT CONTRAST TECHNIQUE: Multidetector CT imaging of the head and cervical spine was performed following the standard protocol without intravenous contrast. Multiplanar CT image reconstructions of the cervical spine were also generated. COMPARISON:  06/16/2016, 09/16/2014 FINDINGS: CT HEAD FINDINGS Brain: Mild global atrophy. No mass effect, midline shift, or acute intracranial hemorrhage. Vascular: Atherosclerotic calcifications. Skull: Intact cranium. Sinuses/Orbits: Mastoid air cells clear. Visualized paranasal sinuses clear. Other: Adenoidal lymphoid tissue is mildly prominent. CT CERVICAL SPINE FINDINGS Alignment: Anatomic Skull base and vertebrae: No fracture or dislocation. Soft tissues and spinal canal: No soft tissue injury or obvious spinal hematoma. Disc levels: Left facet arthropathy at C2-3. Minimal posterior osteophytic ridging at C6-7. Upper chest: Lung apices clear. Other: Thyroid is unremarkable. IMPRESSION: No acute intracranial pathology. No acute cervical spine injury. Electronically Signed   By: Marybelle Killings M.D.   On: 06/29/2016 10:23   Dg Chest Portable 1 View  Result Date: 06/29/2016 CLINICAL DATA:  Altered mental status. EXAM: PORTABLE CHEST 1 VIEW COMPARISON:  06/16/2016. FINDINGS: Cardiomegaly. Previous CABG. Low lung volumes with mild vascular congestion. No overt failure or infiltrates. No definite osseous findings. IMPRESSION: Cardiomegaly with low lung volumes and mild vascular congestion. No definite active infiltrates or failure. Worsening aeration from priors. Electronically Signed   By: Staci Righter M.D.   On: 06/29/2016 09:07   Dg Foot Complete Right  Result Date: 06/29/2016 CLINICAL DATA:  Chronic right foot pain. History of diabetes and gout. EXAM: RIGHT FOOT COMPLETE - 3+ VIEW COMPARISON:  05/26/2010 FINDINGS: Aggressive pattern of diffuse osteoporosis. There are moderate degenerative changes but no acute bony findings. Suspect  prior healed fractures of the second and third proximal phalanges. Diffuse subcutaneous soft tissue swelling/ edema but no gas is seen in the soft tissues and no radiopaque foreign body is identified. There appears to be moderate thickening of the Achilles tendon. IMPRESSION: 1. Aggressive pattern of diffuse osteoporosis. 2. No acute bony findings or destructive bony changes. Moderate degenerative changes. 3. Suspect remote healed fractures of the second and third proximal phalanges. Electronically Signed   By: Marijo Sanes M.D.   On: 06/29/2016 10:54        Scheduled Meds: . furosemide  40 mg Intravenous Q8H  . heparin  5,000 Units Subcutaneous Q8H  . insulin aspart  0-9 Units Subcutaneous TID WC  . sodium chloride flush  3 mL Intravenous Q12H  . traZODone  50 mg Oral QHS   Continuous Infusions: . sodium chloride 10 mL/hr at 06/29/16 1658     LOS: 0 days  Time spent: 35 minutes    Willamina Grieshop A, MD [facility] Pager [phone]  If 7PM-7AM, please contact night-coverage [email or web address] Password [email or web address] 06/30/2016, 11:11 AM

## 2016-06-30 NOTE — Progress Notes (Signed)
Patient with several recent admissions for AMS, somnolence, confusion.  Currently called for agitation, was given Haldol 5 mg IV earlier in the evening without relief.  Based on review of chart, patient was sleeping throughout the day today and his wife was unable to wake him.  I would question whether he has advancing dementia with current sundowning.  Will order Haldol 10 mg IV q6h prn agitation.  Carlyon Shadow, M.D.

## 2016-06-30 NOTE — Evaluation (Addendum)
Speech Language Pathology Evaluation Patient Details Name: [name] MRN: [id number] DOB: 06/18/1941 Today's Date: 06/30/2016 Time: 2035-2055 SLP Time Calculation (min) (ACUTE ONLY): 20 min  Problem List:  Patient Active Problem List   Diagnosis Date Noted  . CKD (chronic kidney disease), stage III 06/29/2016  . COPD exacerbation ([place]) 06/16/2016  . Hyperkalemia 11/24/2015  . UTI (lower urinary tract infection) 11/24/2015  . Acute UTI 09/11/2014  . Acute respiratory failure with hypercapnia ([place]) 09/10/2014  . Acute encephalopathy 09/10/2014  . Alcoholic dementia ([place]) [id number]  . Recurrent falls 09/10/2014  . Left shoulder pain 09/10/2014  . CAD in native artery 09/10/2014  . Diabetes mellitus with peripheral vascular disease ([place]) 09/10/2014  . FTT (failure to thrive) in adult 09/10/2014  . Protein-calorie malnutrition ([place]) 09/10/2014  . Mixed acid base balance disorder 09/09/2014  . Acute kidney injury ([place]) 09/09/2014  . Hypotension 09/09/2014  . Fall 08/27/2014  . Altered mental status 08/26/2014  . Dehydration 08/26/2014  . Alcohol abuse 08/26/2014  . Hypertension 08/26/2014  . Aftercare following surgery of the circulatory system, [place] 08/05/2013  . Leg pain 07/01/2013  . Peripheral vascular disease ([place]) 05/27/2013  . Peripheral angiopathy in diseases classified elsewhere ([place]) 05/02/2012  . Diabetes ([place]) 05/02/2012  . Pain in limb 05/02/2012  . Chronic total occlusion of artery of the extremities ([place]) 05/02/2012  . JOINT EFFUSION, KNEE 12/03/2009  . RUPTURE ROTATOR CUFF 12/03/2009  . ISCHEMIC CARDIOMYOPATHY 08/24/2009  . CHF 08/24/2009  . BACK PAIN, LUMBAR, CHRONIC 08/24/2009   Past Medical History:  Past Medical History:  Diagnosis Date  . Anginal pain ([place])   . Arthritis    "qwhere" (07/09/2013)  . Asthma   . COPD (chronic obstructive pulmonary disease) ([place])    pt denies this hx on 07/09/2013  . Coronary artery disease   . Gout   . H/O  hiatal hernia 1997  . Heart murmur    "had rheumatic fever as a kid" (07/09/2013)  . High cholesterol   . Hypertension   . Myocardial infarction 03/2002; ~ 2011  . Pneumonia    "once" (07/09/2013)  . Renal disorder    "only have 1 kidney; due to go back soon to check the other one" (07/09/2013)  . Rheumatic fever   . RUPTURE ROTATOR CUFF 12/03/2009   Qualifier: Diagnosis of  By: Aline Brochure MD, Dorothyann Peng    . Shortness of breath    "can happen at anytime" (07/09/2013)  . Type II diabetes mellitus ([place])   . 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 ABOVE KNEE LEG AMPUTATION Left 09/2009  . BELOW KNEE LEG AMPUTATION Left 08/2009  . CARDIAC CATHETERIZATION  ~ 2010   "before the bypass" (07/09/2013)  . CARPAL TUNNEL RELEASE Bilateral ~2004; ~ 2011  . CHOLECYSTECTOMY  1980's  . CORONARY ARTERY BYPASS GRAFT  ~ 2010   "CABG X5" (07/09/2013)  . EYE SURGERY    . FEMORAL ARTERY STENT Right 07/09/2013  . GLAUCOMA SURGERY Bilateral ?[id number]  . LOWER EXTREMITY ANGIOGRAM Right Nov. 4, 2014  . LOWER EXTREMITY ANGIOGRAM Right 07/09/2013   Procedure: LOWER EXTREMITY ANGIOGRAM;  Surgeon: Serafina Mitchell, MD;  Location: [facility] CATH LAB;  Service: Cardiovascular;  Laterality: Right;  . NEPHRECTOMY Left 12/19/2001  . PERIPHERAL VASCULAR CATHETERIZATION N/A 02/03/2015   Procedure: Abdominal Aortogram;  Surgeon: Serafina Mitchell, MD;  Location: [facility]  INVASIVE CV LAB;  Service: Cardiovascular;  Laterality: N/A;  . TRIGGER FINGER RELEASE Left ~ 1990's   HPI:  [name] is a 75 y.o. male with medical history significant of COPD, diabetes mellitus type 2, CAD, status post left AKA and CKD stage III came into the hospital because of altered mental status. Apparently patient was admitted to the hospital about 2 weeks ago for same complaints and he left AMA. Patient was in his usual state of  health until last night, according to the ED physician his wife couldn't wake him up today so she called 911 and reported to the hospital.   Assessment / Plan / Recommendation Clinical Impression  Pt seen in room with sitter present. Pt restless and moves legs and arms constantly. He appears anxious and sitter reports that he frequently calls out for "Vickie" and "Karle Starch" (he tells me this is his wife and granddaughter). He is oriented to self, place, and date/day,but disoriented to situation. Pt able to follow commands and express wants/needs, but is negatively impacted by agitation and confusion. Recommend f/u SLP services and consider neurology consult given repeated bouts of confusion.    SLP Assessment  Patient needs continued Speech Lanaguage Pathology Services    Follow Up Recommendations  Home health SLP    Frequency and Duration min 2x/week  1 week      SLP Evaluation Cognition  Overall Cognitive Status: Impaired/Different from baseline Arousal/Alertness: Awake/alert Orientation Level: Oriented to person;Oriented to place;Oriented to time;Disoriented to situation Attention: Sustained Sustained Attention: Impaired Sustained Attention Impairment: Verbal basic Memory: Impaired Memory Impairment: Decreased short term memory Decreased Short Term Memory: Verbal basic Awareness: Impaired Awareness Impairment: Intellectual impairment Problem Solving: Impaired Behaviors: Restless;Impulsive;Verbal agitation;Physical agitation Safety/Judgment: Impaired       Comprehension  Auditory Comprehension Overall Auditory Comprehension: Appears within functional limits for tasks assessed Yes/No Questions: Within Functional Limits Commands: Within Functional Limits Conversation: Simple Interfering Components: Attention;Anxiety Visual Recognition/Discrimination Discrimination: Not tested Reading Comprehension Reading Status: Not tested    Expression Expression Primary Mode of  Expression: Verbal Verbal Expression Overall Verbal Expression: Appears within functional limits for tasks assessed Initiation: No impairment Automatic Speech: Name;Social Response Level of Generative/Spontaneous Verbalization: Conversation Repetition: No impairment Naming: No impairment Pragmatics: No impairment Interfering Components: Attention Non-Verbal Means of Communication: Not applicable Written Expression Dominant Hand: Right Written Expression: Not tested   Oral / Motor  Oral Motor/Sensory Function Overall Oral Motor/Sensory Function: Within functional limits Motor Speech Overall Motor Speech: Appears within functional limits for tasks assessed Respiration: Within functional limits Phonation: Normal Resonance: Within functional limits Articulation: Within functional limitis Intelligibility: Intelligible Motor Planning: Witnin functional limits Motor Speech Errors: Not applicable   GO          Functional Assessment Tool Used: clinical judgment Functional Limitations: Attention Attention Current Status [id number]): At least 40 percent but less than 60 percent impaired, limited or restricted Attention Goal Status [id number]): At least 1 percent but less than 20 percent impaired, limited or restricted        Thank you,  Genene Churn, [place]  [place] 06/30/2016, 9:25 PM

## 2016-06-30 NOTE — Care Management Obs Status (Signed)
[place] NOTIFICATION   Patient Details  Name: [name] MRN: [id number] Date of Birth: 09/08/1940   Medicare Observation Status Notification Given:  Yes    Sherald Barge, RN 06/30/2016, 12:41 PM

## 2016-06-30 NOTE — Progress Notes (Signed)
Inpatient Diabetes Program Recommendations  AACE/ADA: New Consensus Statement on Inpatient Glycemic Control (2015)  Target Ranges:  Prepandial:   less than 140 mg/dL      Peak postprandial:   less than 180 mg/dL (1-2 hours)      Critically ill patients:  140 - 180 mg/dL  Results for [name], [name] (MRN [id number]) as of 06/30/2016 08:16  Ref. Range 06/30/2016 06:10  Glucose Latest Ref Range: 65 - 99 mg/dL 217 (H)   Results for [name], [name] (MRN [id number]) as of 06/30/2016 08:16  Ref. Range 06/29/2016 08:42 06/29/2016 09:46 06/29/2016 10:29 06/29/2016 16:57 06/29/2016 22:18  Glucose-Capillary Latest Ref Range: 65 - 99 mg/dL 51 (L) 87 66 166 (H) 257 (H)   Review of Glycemic Control  Diabetes history: DM2 Outpatient Diabetes medications: Actos 30 mg daily, Amaryl 4 mg QAM, Metformin 500 mg BID, Toujeo 10 units daily (if CBg >200 mg/dl) Current orders for Inpatient glycemic control: Novolog 0-9 units TID with meals  Inpatient Diabetes Program Recommendations: Correction (SSI): Please consider increasing Novolog correction to moderate scale and adding Novolog bedtime correction scale.  NOTE: Patient was initially hypoglycemic which has resolved. Patient received one time dose of Solumedrol 125 mg on 06/29/16 at 11:38 am. No other steroids ordered.  Thanks, Barnie Alderman, RN, MSN, CDE Diabetes Coordinator Inpatient Diabetes Program [phone] (Team Pager from 8am to 5pm)

## 2016-07-01 ENCOUNTER — Inpatient Hospital Stay (HOSPITAL_COMMUNITY): Payer: Medicare Other

## 2016-07-01 LAB — GLUCOSE, CAPILLARY
GLUCOSE-CAPILLARY: 122 mg/dL — AB (ref 65–99)
GLUCOSE-CAPILLARY: 116 mg/dL — AB (ref 65–99)
GLUCOSE-CAPILLARY: 110 mg/dL — AB (ref 65–99)
Glucose-Capillary: 132 mg/dL — ABNORMAL HIGH (ref 65–99)
Glucose-Capillary: 130 mg/dL — ABNORMAL HIGH (ref 65–99)

## 2016-07-01 LAB — CBC
HCT: 47.2 % (ref 39.0–52.0)
Hemoglobin: 15.6 g/dL (ref 13.0–17.0)
MCH: 31.1 pg (ref 26.0–34.0)
MCHC: 33.1 g/dL (ref 30.0–36.0)
MCV: 94 fL (ref 78.0–100.0)
PLATELETS: 261 10*3/uL (ref 150–400)
RBC: 5.02 MIL/uL (ref 4.22–5.81)
RDW: 15.4 % (ref 11.5–15.5)
WBC: 8.5 10*3/uL (ref 4.0–10.5)

## 2016-07-01 LAB — BASIC METABOLIC PANEL
ANION GAP: 11 (ref 5–15)
BUN: 40 mg/dL — ABNORMAL HIGH (ref 6–20)
CALCIUM: 9.3 mg/dL (ref 8.9–10.3)
CO2: 24 mmol/L (ref 22–32)
CREATININE: 1.63 mg/dL — AB (ref 0.61–1.24)
Chloride: 101 mmol/L (ref 101–111)
GFR, EST AFRICAN AMERICAN: 47 mL/min — AB (ref >60)
GFR, EST NON AFRICAN AMERICAN: 40 mL/min — AB (ref >60)
Glucose, Bld: 114 mg/dL — ABNORMAL HIGH (ref 65–99)
Potassium: 4.7 mmol/L (ref 3.5–5.1)
SODIUM: 136 mmol/L (ref 135–145)

## 2016-07-01 LAB — GAMMA GT: GGT: 21 U/L (ref 7–50)

## 2016-07-01 MED ORDER — LORAZEPAM 1 MG PO TABS
2.0000 mg | ORAL_TABLET | Freq: Once | ORAL | Status: AC
  Administered 2016-07-01: 2 mg via ORAL
  Filled 2016-07-01: qty 2

## 2016-07-01 MED ORDER — LORAZEPAM 2 MG/ML IJ SOLN: 2.0000 mg | Freq: Once | INTRAMUSCULAR | Status: AC

## 2016-07-01 MED ORDER — QUETIAPINE FUMARATE 100 MG PO TABS
200.0000 mg | ORAL_TABLET | Freq: Every day | ORAL | Status: DC
  Administered 2016-07-01: 200 mg via ORAL
  Filled 2016-07-01: qty 2

## 2016-07-01 MED ORDER — LORAZEPAM 1 MG PO TABS
1.0000 mg | ORAL_TABLET | Freq: Four times a day (QID) | ORAL | Status: DC | PRN
  Filled 2016-07-01: qty 1

## 2016-07-01 MED ORDER — FOLIC ACID 1 MG PO TABS
1.0000 mg | ORAL_TABLET | Freq: Every day | ORAL | Status: DC
  Administered 2016-07-02: 1 mg via ORAL
  Filled 2016-07-01 (×2): qty 1

## 2016-07-01 MED ORDER — VITAMIN B-1 100 MG PO TABS
100.0000 mg | ORAL_TABLET | Freq: Every day | ORAL | Status: DC
  Administered 2016-07-02: 100 mg via ORAL
  Filled 2016-07-01 (×2): qty 1

## 2016-07-01 MED ORDER — DIAZEPAM 5 MG PO TABS
5.0000 mg | ORAL_TABLET | Freq: Two times a day (BID) | ORAL | Status: DC | PRN
  Administered 2016-07-02: 5 mg via ORAL
  Filled 2016-07-01 (×2): qty 1

## 2016-07-01 MED ORDER — LORAZEPAM 2 MG/ML IJ SOLN
1.0000 mg | Freq: Four times a day (QID) | INTRAMUSCULAR | Status: DC | PRN
  Administered 2016-07-01 (×2): 1 mg via INTRAVENOUS
  Filled 2016-07-01 (×2): qty 1

## 2016-07-01 MED ORDER — DM-GUAIFENESIN ER 30-600 MG PO TB12
2.0000 | ORAL_TABLET | Freq: Two times a day (BID) | ORAL | Status: DC
  Administered 2016-07-01: 2 via ORAL
  Filled 2016-07-01 (×2): qty 2

## 2016-07-01 MED ORDER — ADULT MULTIVITAMIN W/MINERALS CH
1.0000 | ORAL_TABLET | Freq: Every day | ORAL | Status: DC
  Administered 2016-07-02: 1 via ORAL
  Filled 2016-07-01 (×2): qty 1

## 2016-07-01 MED ORDER — THIAMINE HCL 100 MG/ML IJ SOLN: 100.0000 mg | Freq: Every day | INTRAMUSCULAR | Status: DC

## 2016-07-01 NOTE — Progress Notes (Signed)
PROGRESS NOTE  [name]  [id number] DOB: February 18, 1941 DOA: 06/29/2016 PCP: Purvis Kilts, MD Outpatient Specialists:  Subjective: He is awake and alert but continued to be confused and disoriented. Spoke to his wife on the phone, reported last time he had a drink was about 2 years ago. Reportedly he sometimes takes more medications than he should especially oxycodone, Ambien and Valium.  Avoid benzodiazepines, repeat Haldol doses if he is confused at night.  Brief Narrative:  [name] is a 75 y.o. male with medical history significant of COPD, diabetes mellitus type 2, CAD, status post left AKA and CKD stage III came into the hospital because of altered mental status. Apparently patient was admitted to the hospital about 2 weeks ago for same complaints and he left AMA. Patient was in his usual state of health until last night, according to the ED physician his wife couldn't wake him up today so she called 911 and reported to the hospital.  Assessment & Plan:   Principal Problem:   Acute encephalopathy Active Problems:   Altered mental status   Diabetes mellitus with peripheral vascular disease (HCC)   Hyperkalemia   CKD (chronic kidney disease), stage III   Acute encephalopathy -Patient presented with somnolence, as mentioned above his wife couldn't wake him up this morning. -Likely multifactorial secondary to hypercapnia, medications including Neurontin. -Although his toxicity screen showed benzos and opioids initially I thought he does not take these medications. Per his wife he had prescription for oxycodone and Valium use them all the time at home. -Likely acute delirium on top of dementia secondary to medications and hypercarbia. -Seroquel dose increased to 200 mg daily at bedtime  CKD stage III -Patient baseline creatinine around 1.5, came in with creatinine of 2.0. -Last 2-D echo showed ejection fraction of 60-65% with probable diastolic CHF. -Exam  showed evidence of fluid overload, started on IV Lasix, creatinine improved to 1.5, change diuresis to oral Lasix.  Hyperkalemia -Potassium 6.5, likely secondary to lisinopril, received Kayexalate and Lasix, potassium is 4.9 today.  Diabetes mellitus type 2 -With peripheral vascular disease complications including left AKA. -Started on SSI, held his Lantus, Glucophage and Actos, appears to be controlled, hemoglobin A1c in March 2017 was 6.5.  Hyponatremia -Sodium 129 on admission, resolved after diuresis started.  Positive UDS -Per wife he uses Valium and oxycodone at home, prescribed by his PCP. -He does not drink, he used to be heavy alcohol drinker but last drink was 2 years ago. -Discontinue CIWA protocol.    DVT prophylaxis: Subcutaneous heparin Code Status: Full Code Family Communication:  Disposition Plan:  Diet: Diet Carb Modified Fluid consistency: Thin; Room service appropriate? Yes  Consultants:   None  Procedures:   None  Antimicrobials:   none   Objective: Vitals:   06/30/16 1434 07/01/16 0000 07/01/16 0049 07/01/16 0357  BP: 129/77 (!) 155/103 (!) 146/92 119/75  Pulse: (!) 102 (!) 120 (!) 119 (!) 114  Resp: 16  20 18   Temp: 97.5 F (36.4 C)  97.5 F (36.4 C) 97.4 F (36.3 C)  TempSrc:   Oral Oral  SpO2: 96%  96% 93%  Weight:      Height:        Intake/Output Summary (Last 24 hours) at 07/01/16 1322 Last data filed at 07/01/16 1013  Gross per 24 hour  Intake              123 ml  Output  725 ml  Net             -602 ml   Filed Weights   06/29/16 0836  Weight: 93.9 kg (207 lb)    Examination: General exam: Appears calm and comfortable  Respiratory system: Clear to auscultation. Respiratory effort normal. Cardiovascular system: S1 & S2 heard, RRR. No JVD, murmurs, rubs, gallops or clicks. No pedal edema. Gastrointestinal system: Abdomen is nondistended, soft and nontender. No organomegaly or masses felt. Normal bowel sounds  heard. Central nervous system: Alert and oriented. No focal neurological deficits. Extremities: Symmetric 5 x 5 power. Skin: No rashes, lesions or ulcers Psychiatry: Judgement and insight appear normal. Mood & affect appropriate.   Data Reviewed: I have personally reviewed following labs and imaging studies  CBC:  Recent Labs Lab 06/29/16 0850 06/29/16 1200 06/30/16 0610 07/01/16 0553  WBC 6.6  --  4.9 8.5  NEUTROABS 4.2  --   --   --   HGB 13.4 15.0 13.8 15.6  HCT 43.1 44.0 42.8 47.2  MCV 99.5  --  94.3 94.0  PLT 182  --  193 0000000   Basic Metabolic Panel:  Recent Labs Lab 06/29/16 0850 06/29/16 1200 06/30/16 0610 07/01/16 0553  NA 129* 135 133* 136  K 6.5* 6.2* 4.9 4.7  CL 101 102 101 101  CO2 23  --  21* 24  GLUCOSE 175* 71 217* 114*  BUN 34* 37* 35* 40*  CREATININE 2.01* 1.80* 1.55* 1.63*  CALCIUM 8.4*  --  8.8* 9.3   GFR: Estimated Creatinine Clearance: 48.2 mL/min (by C-G formula based on SCr of 1.63 mg/dL (H)). Liver Function Tests:  Recent Labs Lab 06/29/16 0850  AST 18  ALT 13*  ALKPHOS 44  BILITOT 0.5  PROT 6.7  ALBUMIN 3.4*   No results for input(s): LIPASE, AMYLASE in the last 168 hours. No results for input(s): AMMONIA in the last 168 hours. Coagulation Profile:  Recent Labs Lab 06/29/16 0852  INR 0.93   Cardiac Enzymes: No results for input(s): CKTOTAL, CKMB, CKMBINDEX, TROPONINI in the last 168 hours. BNP (last 3 results) No results for input(s): PROBNP in the last 8760 hours. HbA1C:  Recent Labs  06/29/16 0850  HGBA1C 6.2*   CBG:  Recent Labs Lab 06/30/16 1417 06/30/16 1610 06/30/16 2023 07/01/16 0741 07/01/16 1056  GLUCAP 101* 116* 118* 122* 132*   Lipid Profile: No results for input(s): CHOL, HDL, LDLCALC, TRIG, CHOLHDL, LDLDIRECT in the last 72 hours. Thyroid Function Tests:  Recent Labs  06/29/16 0850  TSH 3.330   Anemia Panel: No results for input(s): VITAMINB12, FOLATE, FERRITIN, TIBC, IRON, RETICCTPCT  in the last 72 hours. Urine analysis:    Component Value Date/Time   COLORURINE YELLOW 06/29/2016 [place] 06/29/2016 0850   LABSPEC 1.025 06/29/2016 0850   PHURINE 5.5 06/29/2016 0850   GLUCOSEU NEGATIVE 06/29/2016 0850   HGBUR TRACE (A) 06/29/2016 0850   BILIRUBINUR NEGATIVE 06/29/2016 0850   KETONESUR NEGATIVE 06/29/2016 0850   PROTEINUR NEGATIVE 06/29/2016 0850   UROBILINOGEN 0.2 09/10/2014 1715   NITRITE NEGATIVE 06/29/2016 0850   LEUKOCYTESUR NEGATIVE 06/29/2016 0850   Sepsis Labs: @LABRCNTIP (procalcitonin:4,lacticidven:4)  ) Recent Results (from the past 240 hour(s))  Blood culture (routine x 2)     Status: None (Preliminary result)   Collection Time: 06/29/16 10:42 AM  Result Value Ref Range Status   Specimen Description BLOOD  Final   Special Requests NONE  Final   Culture NO GROWTH 2 DAYS  Final   Report Status PENDING  Incomplete  Blood culture (routine x 2)     Status: None (Preliminary result)   Collection Time: 06/29/16 10:49 AM  Result Value Ref Range Status   Specimen Description BLOOD  Final   Special Requests NONE  Final   Culture NO GROWTH 2 DAYS  Final   Report Status PENDING  Incomplete     Invalid input(s): PROCALCITONIN, LACTICACIDVEN   Radiology Studies: Dg Chest 1 View  Result Date: 07/01/2016 CLINICAL DATA:  Chest congestion, agitation and confusion, hypertension with tachycardia EXAM: CHEST 1 VIEW COMPARISON:  06/29/2016 FINDINGS: Median sternotomy wires and surgical clips are again visualized. The patient is rotated. No focal consolidation or effusion. Stable enlarged cardiomediastinal none. Pulmonary vascularity upper normal. No overt edema. No pneumothorax. IMPRESSION: Stable cardiomegaly without overt edema. Electronically Signed   By: Donavan Foil M.D.   On: 07/01/2016 02:54        Scheduled Meds: . dextromethorphan-guaiFENesin  2 tablet Oral BID  . folic acid  1 mg Oral Daily  . furosemide  40 mg Oral Daily  .  heparin  5,000 Units Subcutaneous Q8H  . insulin aspart  0-9 Units Subcutaneous TID WC  . insulin glargine  10 Units Subcutaneous Daily  . multivitamin with minerals  1 tablet Oral Daily  . QUEtiapine  200 mg Oral QHS  . sodium chloride flush  3 mL Intravenous Q12H  . thiamine  100 mg Oral Daily   Or  . thiamine  100 mg Intravenous Daily   Continuous Infusions: . sodium chloride Stopped (06/30/16 1556)     LOS: 1 day    Time spent: 35 minutes    Cassidy Tabet A, MD [facility] Pager [phone]  If 7PM-7AM, please contact night-coverage [email or web address] Password [email or web address] 07/01/2016, 1:22 PM

## 2016-07-01 NOTE — Care Management Important Message (Signed)
Important Message  Patient Details  Name: TRUSTYN EWERT MRN: [id number] Date of Birth: 1941/06/14   Medicare Important Message Given:  Yes    Sherald Barge, RN 07/01/2016, 7:51 AM

## 2016-07-01 NOTE — Progress Notes (Signed)
Asked to see patient regarding agitation.  Also with confusion, hypertension, tachycardia. Upon entering the room, I found the patient naked and quite agitated.  He pulled out his IV and all lines. Review of chart indicates h/o ETOH abuse.  Patient reports last drink was 3-4 days ago, 2 vodka/orange juice. Strongly suspect ETOH withdrawal. CIWA protocol initiated.   Will check GGT for further support of ETOH dependence.  Carlyon Shadow, M.D.

## 2016-07-01 NOTE — Progress Notes (Signed)
MD at bedside with nurse. Pt told MD he had Vodka and orange juice 3 days ago. Pt agitated and taking off clothes, heart monitor, taking bed a part, and calling for "Jane". See new orders.

## 2016-07-02 LAB — GLUCOSE, CAPILLARY
Glucose-Capillary: 102 mg/dL — ABNORMAL HIGH (ref 65–99)
Glucose-Capillary: 147 mg/dL — ABNORMAL HIGH (ref 65–99)

## 2016-07-02 LAB — CBC
HCT: 45.3 % (ref 39.0–52.0)
Hemoglobin: 14.8 g/dL (ref 13.0–17.0)
MCH: 30.8 pg (ref 26.0–34.0)
MCHC: 32.7 g/dL (ref 30.0–36.0)
MCV: 94.4 fL (ref 78.0–100.0)
PLATELETS: 269 10*3/uL (ref 150–400)
RBC: 4.8 MIL/uL (ref 4.22–5.81)
RDW: 15.4 % (ref 11.5–15.5)
WBC: 7.6 10*3/uL (ref 4.0–10.5)

## 2016-07-02 LAB — BASIC METABOLIC PANEL
Anion gap: 15 (ref 5–15)
BUN: 50 mg/dL — AB (ref 6–20)
CO2: 20 mmol/L — ABNORMAL LOW (ref 22–32)
CREATININE: 1.72 mg/dL — AB (ref 0.61–1.24)
Calcium: 8.9 mg/dL (ref 8.9–10.3)
Chloride: 102 mmol/L (ref 101–111)
GFR calc Af Amer: 44 mL/min — ABNORMAL LOW (ref >60)
GFR, EST NON AFRICAN AMERICAN: 38 mL/min — AB (ref >60)
Glucose, Bld: 105 mg/dL — ABNORMAL HIGH (ref 65–99)
Potassium: 4.2 mmol/L (ref 3.5–5.1)
SODIUM: 137 mmol/L (ref 135–145)

## 2016-07-02 MED ORDER — QUETIAPINE FUMARATE 200 MG PO TABS: 200.0000 mg | ORAL_TABLET | Freq: Every day | ORAL | 0 refills | Status: DC

## 2016-07-02 MED ORDER — FUROSEMIDE 20 MG PO TABS: 20.0000 mg | ORAL_TABLET | Freq: Every day | ORAL | 0 refills | Status: DC

## 2016-07-02 MED ORDER — GABAPENTIN 300 MG PO CAPS: 300.0000 mg | ORAL_CAPSULE | Freq: Two times a day (BID) | ORAL | Status: AC

## 2016-07-02 MED ORDER — LEVOFLOXACIN 500 MG PO TABS: 500.0000 mg | ORAL_TABLET | Freq: Every day | ORAL | 0 refills | Status: DC

## 2016-07-02 NOTE — Discharge Summary (Signed)
Physician Discharge Summary  [name] [id number] DOB: 1941-04-24 DOA: 06/29/2016  PCP: Purvis Kilts, MD  Admit date: 06/29/2016 Discharge date: 07/02/2016  Admitted From: Home Disposition: Home  Recommendations for Outpatient Follow-up:  1. Follow up with PCP in 1-2 weeks 2. Please obtain BMP/CBC in one week  Home Health: NA Equipment/Devices: NA  Discharge Condition: Stable CODE STATUS: Full  Diet recommendation: Heart Healthy / Carb Modified   Brief/Interim Summary: [name] Przywara Cuozziis a 75 y.o.malewith medical history significant of COPD, diabetes mellitus type 2, CAD, status post left AKA and CKD stage III came into the hospital because of altered mental status. Apparently patient was admitted to the hospital about 2 weeks ago for same complaints and he left AMA. Patient was in his usual state of health until last night, according to the ED physician his wife couldn't wake him up today so she called 911 and reported to the hospital.  Discharge Diagnoses:  Principal Problem:   Acute encephalopathy Active Problems:   Altered mental status   Diabetes mellitus with peripheral vascular disease ([place])   Hyperkalemia   CKD (chronic kidney disease), stage III   Acute encephalopathy -Patient presented with somnolence, as mentioned above his wife couldn't wake him up this morning. -Likely multifactorial secondary to hypercapnia, medications including Neurontin, Valium and oxycodone. -Although his toxicity screen showed benzos and opioids initially I thought he does not take these medications. Per his wife he had prescription for oxycodone and Valium use them all the time at home. -Acute encephalopathy, cannot r/o mild early dementia (patient has history of heavy alcohol abuse, quit in January 2016) -Patient is well awake and alert today and oriented 3, discussed with his wife prior to discharge. -Neurontin dose decreased to 300 twice a day (was on 600-900), started  on Seroquel at night  CKD stage III -Patient baseline creatinine around 1.5, came in with creatinine of 2.0. -Last 2-D echo showed ejection fraction of 60-65% with probable diastolic CHF. -Exam showed evidence of fluid overload on admission treated with IV Lasix. -Creatinine on discharge is 1.7, Lasix 20 mg on discharge.  Hyperkalemia -Potassium 6.5, likely secondary to ramipril, received Kayexalate and Lasix, potassium is 4.9 today. -This is second time comes with hyperkalemia, I think this is might be intolerance to ace inhibitors, ramipril discontinued  Diabetes mellitus type 2 -With peripheral vascular disease complications including left AKA. -Started on SSI, held his Lantus, Glucophage and Actos, appears to be controlled, hemoglobin A1c in March 2017 was 6.5.  Hyponatremia -Sodium 129 on admission, resolved after diuresis started.  Positive UDS -Per wife he uses Valium and oxycodone at home, prescribed by his PCP. -He does not drink, he used to be heavy alcohol drinker but last drink was 2 years ago. -Discontinue CIWA protocol.  Question of acute bronchitis -Patient has some cough which is productive of yellow sputum, CXR without infiltrates. -Given levofloxacin for 5 more days.  Tobacco abuse -Patient smokes cigarettes, he was asking everyday he was in the hospital to be discharged so he can go smoke.  Discharge Instructions  Discharge Instructions    Diet - low sodium heart healthy    Complete by:  As directed    Increase activity slowly    Complete by:  As directed        Medication List    STOP taking these medications   doxycycline 100 MG tablet Commonly known as:  VIBRA-TABS   ramipril 10 MG capsule Commonly known as:  ALTACE  zolpidem 10 MG tablet Commonly known as:  AMBIEN     TAKE these medications   albuterol 108 (90 Base) MCG/ACT inhaler Commonly known as:  PROVENTIL HFA;VENTOLIN HFA Inhale 2 puffs into the lungs every 4 (four) hours as  needed for wheezing or shortness of breath.   albuterol (2.5 MG/3ML) 0.083% nebulizer solution Commonly known as:  PROVENTIL Take 2.5 mg by nebulization every 6 (six) hours as needed for wheezing or shortness of breath.   aspirin EC 81 MG tablet Take 81 mg by mouth at bedtime.   BEVESPI IN Inhale 1 puff into the lungs daily.   cholecalciferol 400 units Tabs tablet Commonly known as:  VITAMIN D Take 1,200 Units by mouth 2 (two) times daily.   DULERA 200-5 MCG/ACT Aero Generic drug:  mometasone-formoterol Inhale 2 puffs into the lungs 2 (two) times daily.   FLUoxetine 10 MG capsule Commonly known as:  PROZAC Take 10 mg by mouth daily.   furosemide 20 MG tablet Commonly known as:  LASIX Take 1 tablet (20 mg total) by mouth daily. Start taking on:  07/03/2016   gabapentin 300 MG capsule Commonly known as:  NEURONTIN Take 1 capsule (300 mg total) by mouth 2 (two) times daily. Pt takes two capsules in the morning and three at bedtime. What changed:  how much to take   glimepiride 4 MG tablet Commonly known as:  AMARYL Take 4 mg by mouth daily with breakfast.   levofloxacin 500 MG tablet Commonly known as:  LEVAQUIN Take 1 tablet (500 mg total) by mouth daily.   metFORMIN 500 MG tablet Commonly known as:  GLUCOPHAGE Take 500 mg by mouth 2 (two) times daily with a meal.   nitroGLYCERIN 0.4 MG SL tablet Commonly known as:  NITROSTAT Place 0.4 mg under the tongue every 5 (five) minutes as needed for chest pain.   pioglitazone 30 MG tablet Commonly known as:  ACTOS Take 30 mg by mouth daily.   QUEtiapine 200 MG tablet Commonly known as:  SEROQUEL Take 1 tablet (200 mg total) by mouth at bedtime.   senna-docusate 8.6-50 MG tablet Commonly known as:  Senokot-S Take 1 tablet by mouth at bedtime as needed for mild constipation.   tamsulosin 0.4 MG Caps capsule Commonly known as:  FLOMAX Take 0.4 mg by mouth 2 (two) times daily.   TOUJEO SOLOSTAR 300 UNIT/ML  Sopn Generic drug:  Insulin Glargine Inject 10 Units into the skin daily as needed (for BS greater than 200).   triazolam 0.25 MG tablet Commonly known as:  HALCION Take 0.25 mg by mouth at bedtime as needed for sleep.       Allergies  Allergen Reactions  . Erythromycin Anaphylaxis and Rash  . Iohexol Anaphylaxis, Hives, Itching and Other (See Comments)    Note:  13 HR PRE-MED GIVEN AND SUCCESSFUL-ARS 07/15/07.     Marland Kitchen Penicillins Anaphylaxis, Itching and Other (See Comments)    Has patient had a PCN reaction causing immediate rash, facial/tongue/throat swelling, SOB or lightheadedness with hypotension: Yes Has patient had a PCN reaction causing severe rash involving mucus membranes or skin necrosis: No Has patient had a PCN reaction that required hospitalization No Has patient had a PCN reaction occurring within the last 10 years: No If all of the above answers are "NO", then may proceed with Cephalosporin use.    Consultations:  None   Procedures/Studies: Dg Chest 1 View  Result Date: 07/01/2016 CLINICAL DATA:  Chest congestion, agitation and confusion, hypertension with  tachycardia EXAM: CHEST 1 VIEW COMPARISON:  06/29/2016 FINDINGS: Median sternotomy wires and surgical clips are again visualized. The patient is rotated. No focal consolidation or effusion. Stable enlarged cardiomediastinal none. Pulmonary vascularity upper normal. No overt edema. No pneumothorax. IMPRESSION: Stable cardiomegaly without overt edema. Electronically Signed   By: Donavan Foil M.D.   On: 07/01/2016 02:54   Ct Head Wo Contrast  Result Date: 06/29/2016 CLINICAL DATA:  Fall EXAM: CT HEAD WITHOUT CONTRAST CT CERVICAL SPINE WITHOUT CONTRAST TECHNIQUE: Multidetector CT imaging of the head and cervical spine was performed following the standard protocol without intravenous contrast. Multiplanar CT image reconstructions of the cervical spine were also generated. COMPARISON:  06/16/2016, 09/16/2014  FINDINGS: CT HEAD FINDINGS Brain: Mild global atrophy. No mass effect, midline shift, or acute intracranial hemorrhage. Vascular: Atherosclerotic calcifications. Skull: Intact cranium. Sinuses/Orbits: Mastoid air cells clear. Visualized paranasal sinuses clear. Other: Adenoidal lymphoid tissue is mildly prominent. CT CERVICAL SPINE FINDINGS Alignment: Anatomic Skull base and vertebrae: No fracture or dislocation. Soft tissues and spinal canal: No soft tissue injury or obvious spinal hematoma. Disc levels: Left facet arthropathy at C2-3. Minimal posterior osteophytic ridging at C6-7. Upper chest: Lung apices clear. Other: Thyroid is unremarkable. IMPRESSION: No acute intracranial pathology. No acute cervical spine injury. Electronically Signed   By: Marybelle Killings M.D.   On: 06/29/2016 10:23   Ct Head Wo Contrast  Result Date: 06/16/2016 CLINICAL DATA:  Acute onset of garbled speech.  Initial encounter. EXAM: CT HEAD WITHOUT CONTRAST TECHNIQUE: Contiguous axial images were obtained from the base of the skull through the vertex without intravenous contrast. COMPARISON:  CT of the head performed 09/16/2014 FINDINGS: Brain: No evidence of acute infarction, hemorrhage, hydrocephalus, extra-axial collection or mass lesion/mass effect. Prominence of the ventricles and sulci reflects mild cortical volume loss. The brainstem and fourth ventricle are within normal limits. The basal ganglia are unremarkable in appearance. The cerebral hemispheres demonstrate grossly normal gray-white differentiation. No mass effect or midline shift is seen. Vascular: No hyperdense vessel or unexpected calcification. Skull: There is no evidence of fracture; visualized osseous structures are unremarkable in appearance. Sinuses/Orbits: The orbits are within normal limits. The paranasal sinuses and mastoid air cells are well-aerated. Other: No significant soft tissue abnormalities are seen. IMPRESSION: 1. No acute intracranial pathology seen on  CT. 2. Mild cortical volume loss noted. Electronically Signed   By: Garald Balding M.D.   On: 06/16/2016 20:24   Ct Cervical Spine Wo Contrast  Result Date: 06/29/2016 CLINICAL DATA:  Fall EXAM: CT HEAD WITHOUT CONTRAST CT CERVICAL SPINE WITHOUT CONTRAST TECHNIQUE: Multidetector CT imaging of the head and cervical spine was performed following the standard protocol without intravenous contrast. Multiplanar CT image reconstructions of the cervical spine were also generated. COMPARISON:  06/16/2016, 09/16/2014 FINDINGS: CT HEAD FINDINGS Brain: Mild global atrophy. No mass effect, midline shift, or acute intracranial hemorrhage. Vascular: Atherosclerotic calcifications. Skull: Intact cranium. Sinuses/Orbits: Mastoid air cells clear. Visualized paranasal sinuses clear. Other: Adenoidal lymphoid tissue is mildly prominent. CT CERVICAL SPINE FINDINGS Alignment: Anatomic Skull base and vertebrae: No fracture or dislocation. Soft tissues and spinal canal: No soft tissue injury or obvious spinal hematoma. Disc levels: Left facet arthropathy at C2-3. Minimal posterior osteophytic ridging at C6-7. Upper chest: Lung apices clear. Other: Thyroid is unremarkable. IMPRESSION: No acute intracranial pathology. No acute cervical spine injury. Electronically Signed   By: Marybelle Killings M.D.   On: 06/29/2016 10:23   Dg Chest Portable 1 View  Result Date: 06/29/2016 CLINICAL DATA:  Altered mental status. EXAM: PORTABLE CHEST 1 VIEW COMPARISON:  06/16/2016. FINDINGS: Cardiomegaly. Previous CABG. Low lung volumes with mild vascular congestion. No overt failure or infiltrates. No definite osseous findings. IMPRESSION: Cardiomegaly with low lung volumes and mild vascular congestion. No definite active infiltrates or failure. Worsening aeration from priors. Electronically Signed   By: Staci Righter M.D.   On: 06/29/2016 09:07   Dg Chest Port 1 View  Result Date: 06/16/2016 CLINICAL DATA:  Shortness of breath and wheezing. EXAM:  PORTABLE CHEST 1 VIEW COMPARISON:  06/05/2016 FINDINGS: Previous median sternotomy. The heart is enlarged. There is aortic atherosclerosis. There is pulmonary venous hypertension, probably with minimal interstitial edema. No alveolar edema. No consolidation or collapse. IMPRESSION: Probable mild interstitial edema. Electronically Signed   By: Nelson Chimes M.D.   On: 06/16/2016 19:23   Dg Chest Portable 1 View  Result Date: 06/05/2016 CLINICAL DATA:  Shortness of breath. EXAM: PORTABLE CHEST 1 VIEW COMPARISON:  Radiographs of November 24, 2015. FINDINGS: Stable cardiomediastinal silhouette. Sternotomy wires are noted. No pneumothorax or pleural effusion is noted. No acute pulmonary disease is noted. Bony thorax is unremarkable. IMPRESSION: No acute cardiopulmonary abnormality seen. Electronically Signed   By: Marijo Conception, M.D.   On: 06/05/2016 14:28   Dg Foot Complete Right  Result Date: 06/29/2016 CLINICAL DATA:  Chronic right foot pain. History of diabetes and gout. EXAM: RIGHT FOOT COMPLETE - 3+ VIEW COMPARISON:  05/26/2010 FINDINGS: Aggressive pattern of diffuse osteoporosis. There are moderate degenerative changes but no acute bony findings. Suspect prior healed fractures of the second and third proximal phalanges. Diffuse subcutaneous soft tissue swelling/ edema but no gas is seen in the soft tissues and no radiopaque foreign body is identified. There appears to be moderate thickening of the Achilles tendon. IMPRESSION: 1. Aggressive pattern of diffuse osteoporosis. 2. No acute bony findings or destructive bony changes. Moderate degenerative changes. 3. Suspect remote healed fractures of the second and third proximal phalanges. Electronically Signed   By: Marijo Sanes M.D.   On: 06/29/2016 10:54    (Echo, Carotid, EGD, Colonoscopy, ERCP)    Subjective:   Discharge Exam: Vitals:   07/01/16 2140 07/02/16 0654  BP: 125/86 (!) 146/86  Pulse: (!) 108 (!) 102  Resp: 18 19  Temp: 97.6 F  (36.4 C) 97.7 F (36.5 C)   Vitals:   07/01/16 0357 07/01/16 1341 07/01/16 2140 07/02/16 0654  BP: 119/75 124/86 125/86 (!) 146/86  Pulse: (!) 114 (!) 111 (!) 108 (!) 102  Resp: 18 20 18 19   Temp: 97.4 F (36.3 C) 97.8 F (36.6 C) 97.6 F (36.4 C) 97.7 F (36.5 C)  TempSrc: Oral Oral Oral Oral  SpO2: 93% 97% 96% 96%  Weight:      Height:        General: Pt is alert, awake, not in acute distress Cardiovascular: RRR, S1/S2 +, no rubs, no gallops Respiratory: CTA bilaterally, no wheezing, no rhonchi Abdominal: Soft, NT, ND, bowel sounds + Extremities: no edema, no cyanosis    The results of significant diagnostics from this hospitalization (including imaging, microbiology, ancillary and laboratory) are listed below for reference.     Microbiology: Recent Results (from the past 240 hour(s))  Blood culture (routine x 2)     Status: None (Preliminary result)   Collection Time: 06/29/16 10:42 AM  Result Value Ref Range Status   Specimen Description BLOOD  Final   Special Requests NONE  Final   Culture NO GROWTH 3 DAYS  Final   Report Status PENDING  Incomplete  Blood culture (routine x 2)     Status: None (Preliminary result)   Collection Time: 06/29/16 10:49 AM  Result Value Ref Range Status   Specimen Description BLOOD  Final   Special Requests NONE  Final   Culture NO GROWTH 3 DAYS  Final   Report Status PENDING  Incomplete     Labs: BNP (last 3 results)  Recent Labs  11/24/15 1020 06/16/16 1942 06/29/16 0850  BNP 281.0* 215.0* [id number]   Basic Metabolic Panel:  Recent Labs Lab 06/29/16 0850 06/29/16 1200 06/30/16 0610 07/01/16 0553 07/02/16 0638  NA 129* 135 133* 136 137  K 6.5* 6.2* 4.9 4.7 4.2  CL 101 102 101 101 102  CO2 23  --  21* 24 20*  GLUCOSE 175* 71 217* 114* 105*  BUN 34* 37* 35* 40* 50*  CREATININE 2.01* 1.80* 1.55* 1.63* 1.72*  CALCIUM 8.4*  --  8.8* 9.3 8.9   Liver Function Tests:  Recent Labs Lab 06/29/16 0850  AST 18  ALT  13*  ALKPHOS 44  BILITOT 0.5  PROT 6.7  ALBUMIN 3.4*   No results for input(s): LIPASE, AMYLASE in the last 168 hours. No results for input(s): AMMONIA in the last 168 hours. CBC:  Recent Labs Lab 06/29/16 0850 06/29/16 1200 06/30/16 0610 07/01/16 0553 07/02/16 0638  WBC 6.6  --  4.9 8.5 7.6  NEUTROABS 4.2  --   --   --   --   HGB 13.4 15.0 13.8 15.6 14.8  HCT 43.1 44.0 42.8 47.2 45.3  MCV 99.5  --  94.3 94.0 94.4  PLT 182  --  193 261 269   Cardiac Enzymes: No results for input(s): CKTOTAL, CKMB, CKMBINDEX, TROPONINI in the last 168 hours. BNP: Invalid input(s): POCBNP CBG:  Recent Labs Lab 07/01/16 1642 07/01/16 1646 07/01/16 2135 07/02/16 0719 07/02/16 1104  GLUCAP 130* 116* 110* 102* 147*   D-Dimer No results for input(s): DDIMER in the last 72 hours. Hgb A1c No results for input(s): HGBA1C in the last 72 hours. Lipid Profile No results for input(s): CHOL, HDL, LDLCALC, TRIG, CHOLHDL, LDLDIRECT in the last 72 hours. Thyroid function studies No results for input(s): TSH, T4TOTAL, T3FREE, THYROIDAB in the last 72 hours.  Invalid input(s): FREET3 Anemia work up No results for input(s): VITAMINB12, FOLATE, FERRITIN, TIBC, IRON, RETICCTPCT in the last 72 hours. Urinalysis    Component Value Date/Time   COLORURINE YELLOW 06/29/2016 0850   APPEARANCEUR CLEAR 06/29/2016 0850   LABSPEC 1.025 06/29/2016 0850   PHURINE 5.5 06/29/2016 0850   GLUCOSEU NEGATIVE 06/29/2016 0850   HGBUR TRACE (A) 06/29/2016 0850   BILIRUBINUR NEGATIVE 06/29/2016 0850   KETONESUR NEGATIVE 06/29/2016 0850   PROTEINUR NEGATIVE 06/29/2016 0850   UROBILINOGEN 0.2 09/10/2014 1715   NITRITE NEGATIVE 06/29/2016 0850   LEUKOCYTESUR NEGATIVE 06/29/2016 0850   Sepsis Labs Invalid input(s): PROCALCITONIN,  WBC,  LACTICIDVEN Microbiology Recent Results (from the past 240 hour(s))  Blood culture (routine x 2)     Status: None (Preliminary result)   Collection Time: 06/29/16 10:42 AM   Result Value Ref Range Status   Specimen Description BLOOD  Final   Special Requests NONE  Final   Culture NO GROWTH 3 DAYS  Final   Report Status PENDING  Incomplete  Blood culture (routine x 2)     Status: None (Preliminary result)   Collection Time: 06/29/16 10:49 AM  Result Value Ref Range Status  Specimen Description BLOOD  Final   Special Requests NONE  Final   Culture NO GROWTH 3 DAYS  Final   Report Status PENDING  Incomplete     Time coordinating discharge: Over 30 minutes  SIGNED:   Birdie Hopes, MD  [facility] 07/02/2016, 12:20 PM Pager   If 7PM-7AM, please contact night-coverage [email or web address] Password [email or web address]

## 2016-07-02 NOTE — Progress Notes (Signed)
Discharge instructions given, verbalized understanding, out in stable condition via w/c with staff. 

## 2016-07-04 LAB — CULTURE, BLOOD (ROUTINE X 2)
CULTURE: NO GROWTH
Culture: NO GROWTH

## 2016-07-11 ENCOUNTER — Telehealth: Payer: Self-pay

## 2016-07-11 ENCOUNTER — Encounter: Payer: Self-pay | Admitting: Family

## 2016-07-11 DIAGNOSIS — M7989 Other specified soft tissue disorders: Secondary | ICD-10-CM

## 2016-07-11 DIAGNOSIS — I739 Peripheral vascular disease, unspecified: Secondary | ICD-10-CM

## 2016-07-11 DIAGNOSIS — M79604 Pain in right leg: Secondary | ICD-10-CM

## 2016-07-11 NOTE — Telephone Encounter (Signed)
Phone call from pt.  Reported he has had worsening swelling and onset of redness of right foot to above calf in past 24 hrs.  Also, reported warmth and tenderness of the right LE.  Stated the 2nd toe of right foot has a dark band around it.  Reported has had no fever or chills.  Stated he was supposed to f/u 2 mos. ago, and was [place] for [place] matters.  Is requesting an early AM appt. ASAP.  Discussed with Dr. Trula Slade.  Rec'd new orders to Right LE Arterial Duplex and ABI's., and office appt.  Will contact pt. For appts.

## 2016-07-12 ENCOUNTER — Ambulatory Visit (INDEPENDENT_AMBULATORY_CARE_PROVIDER_SITE_OTHER): Payer: Medicare Other | Admitting: Family

## 2016-07-12 ENCOUNTER — Encounter: Payer: Self-pay | Admitting: Family

## 2016-07-12 ENCOUNTER — Ambulatory Visit (INDEPENDENT_AMBULATORY_CARE_PROVIDER_SITE_OTHER)
Admission: RE | Admit: 2016-07-12 | Discharge: 2016-07-12 | Disposition: A | Discharge status: Home / Self Care | Payer: Medicare Other | Source: Ambulatory Visit | Attending: Family | Admitting: Family

## 2016-07-12 ENCOUNTER — Ambulatory Visit (HOSPITAL_COMMUNITY)
Admission: RE | Admit: 2016-07-12 | Discharge: 2016-07-12 | Disposition: A | Discharge status: Home / Self Care | Payer: Medicare Other | Source: Ambulatory Visit | Attending: Family | Admitting: Family

## 2016-07-12 ENCOUNTER — Other Ambulatory Visit: Payer: Self-pay | Admitting: *Deleted

## 2016-07-12 VITALS — BP 116/79 | HR 102 | Temp 98.1°F | Ht 75.0 in

## 2016-07-12 DIAGNOSIS — M79671 Pain in right foot: Secondary | ICD-10-CM

## 2016-07-12 DIAGNOSIS — M79661 Pain in right lower leg: Secondary | ICD-10-CM

## 2016-07-12 DIAGNOSIS — M7989 Other specified soft tissue disorders: Secondary | ICD-10-CM | POA: Insufficient documentation

## 2016-07-12 DIAGNOSIS — I779 Disorder of arteries and arterioles, unspecified: Secondary | ICD-10-CM

## 2016-07-12 DIAGNOSIS — Z720 Tobacco use: Secondary | ICD-10-CM | POA: Diagnosis not present

## 2016-07-12 DIAGNOSIS — E1151 Type 2 diabetes mellitus with diabetic peripheral angiopathy without gangrene: Secondary | ICD-10-CM | POA: Diagnosis not present

## 2016-07-12 DIAGNOSIS — I739 Peripheral vascular disease, unspecified: Secondary | ICD-10-CM | POA: Diagnosis not present

## 2016-07-12 DIAGNOSIS — M79604 Pain in right leg: Secondary | ICD-10-CM | POA: Insufficient documentation

## 2016-07-12 DIAGNOSIS — Z89612 Acquired absence of left leg above knee: Secondary | ICD-10-CM

## 2016-07-12 DIAGNOSIS — F17200 Nicotine dependence, unspecified, uncomplicated: Secondary | ICD-10-CM | POA: Diagnosis not present

## 2016-07-12 MED ORDER — CIPROFLOXACIN HCL 500 MG PO TABS: 500.0000 mg | ORAL_TABLET | Freq: Two times a day (BID) | ORAL | 0 refills | Status: DC

## 2016-07-12 NOTE — Progress Notes (Signed)
[facility] [place]   CC: Follow up peripheral artery occlusive disease  History of Present Illness [name] is a 75 y.o. male patient of Dr. Trula Slade who is s/p stenting of his right superficial femoral artery on 07/09/2013. He has a chronic contracture of his right leg;  he has a left above-knee amputation but has not been able to use his prosthesis. He gets around by motorized wheelchair.  In the past he has complained of swelling in his right leg as well as feeling very cold knee.    The patient continues to smoke.  He remains a diabetic.  He takes Neurontin for his neuropathic pain.  He has a history of coronary artery disease with 5 vessel CABG about 2008 in [place].  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   On 02/03/15 Dr. Trula Slade performed an arteriogram. Findings were as follows: #1  no significant aortoiliac occlusion or stenosis #2  right lower extremity reveals a widely patent stent and no significant change from previous imaging.  There is diffuse disease throughout the femoral-popliteal segment with no stenosis being greater than 30%.  Pt returns today with c/o increased redness, warmth, and swelling of right foot to above calf that started about a month ago, beginning of October 2017, has worsened in the last week, severe pain in his right foot.  He denies any worse dyspnea than usual, has COPD.   Pt Diabetic: Yes, A1C 6.2 in October 2017 (review of records) Pt smoker: smoker  (1 ppd, started at age 62 yrs, quit a few times)  Pt meds include: Statin :No Betablocker: No ASA: Yes Other anticoagulants/antiplatelets: no  Past Medical History:  Diagnosis Date  . Anginal pain ([place])   . Arthritis    "qwhere" (07/09/2013)  . Asthma   . COPD (chronic obstructive pulmonary disease) ([place])    pt denies this hx on 07/09/2013  . Coronary artery disease   . Gout   . H/O hiatal hernia 1997  . Heart murmur    "had rheumatic fever as a kid"  (07/09/2013)  . High cholesterol   . Hypertension   . Myocardial infarction 03/2002; ~ 2011  . Pneumonia    "once" (07/09/2013)  . Renal disorder    "only have 1 kidney; due to go back soon to check the other one" (07/09/2013)  . Rheumatic fever   . RUPTURE ROTATOR CUFF 12/03/2009   Qualifier: Diagnosis of  By: Aline Brochure MD, Dorothyann Peng    . Shortness of breath    "can happen at anytime" (07/09/2013)  . Type II diabetes mellitus ([place])   . Ureter cancer [facility] [address]) 12/2001   "shut down my kidney & resulted in nephrectomy" (07/09/2013)    Social History Social History  Substance Use Topics  . Smoking status: Current Every Day Smoker    Packs/day: 1.00    Years: 54.00    Types: Cigarettes  . Smokeless tobacco: Never Used  . Alcohol use 0.0 oz/week     Comment: social drinker    Family History Family History  Problem Relation Age of Onset  . Cancer Mother   . Heart attack Father     Past Surgical History:  Procedure Laterality Date  . ABOVE KNEE LEG AMPUTATION Left 09/2009  . BELOW KNEE LEG AMPUTATION Left 08/2009  . CARDIAC CATHETERIZATION  ~ 2010   "before the bypass" (07/09/2013)  . CARPAL TUNNEL RELEASE Bilateral ~2004; ~ 2011  . CHOLECYSTECTOMY  1980's  . CORONARY ARTERY  BYPASS GRAFT  ~ 2010   "CABG X5" (07/09/2013)  . EYE SURGERY    . FEMORAL ARTERY STENT Right 07/09/2013  . GLAUCOMA SURGERY Bilateral ?[id number]  . LOWER EXTREMITY ANGIOGRAM Right Nov. 4, 2014  . LOWER EXTREMITY ANGIOGRAM Right 07/09/2013   Procedure: LOWER EXTREMITY ANGIOGRAM;  Surgeon: Serafina Mitchell, MD;  Location: [facility] CATH LAB;  Service: Cardiovascular;  Laterality: Right;  . NEPHRECTOMY Left 12/19/2001  . PERIPHERAL VASCULAR CATHETERIZATION N/A 02/03/2015   Procedure: Abdominal Aortogram;  Surgeon: Serafina Mitchell, MD;  Location: [place] CV LAB;  Service: Cardiovascular;  Laterality: N/A;  . TRIGGER FINGER RELEASE Left ~ 1990's    Allergies  Allergen Reactions  . Erythromycin Anaphylaxis and Rash  .  Iohexol Anaphylaxis, Hives, Itching and Other (See Comments)    Note:  13 HR PRE-MED GIVEN AND SUCCESSFUL-ARS 07/15/07.     Marland Kitchen Penicillins Anaphylaxis, Itching and Other (See Comments)    Has patient had a PCN reaction causing immediate rash, facial/tongue/throat swelling, SOB or lightheadedness with hypotension: Yes Has patient had a PCN reaction causing severe rash involving mucus membranes or skin necrosis: No Has patient had a PCN reaction that required hospitalization No Has patient had a PCN reaction occurring within the last 10 years: No If all of the above answers are "NO", then may proceed with Cephalosporin use.    Current Outpatient Prescriptions  Medication Sig Dispense Refill  . albuterol (PROVENTIL HFA;VENTOLIN HFA) 108 (90 Base) MCG/ACT inhaler Inhale 2 puffs into the lungs every 4 (four) hours as needed for wheezing or shortness of breath.    Marland Kitchen albuterol (PROVENTIL) (2.5 MG/3ML) 0.083% nebulizer solution Take 2.5 mg by nebulization every 6 (six) hours as needed for wheezing or shortness of breath.    Marland Kitchen aspirin EC 81 MG tablet Take 81 mg by mouth at bedtime.    . cholecalciferol (VITAMIN D) 400 units TABS tablet Take 1,200 Units by mouth 2 (two) times daily.    Marland Kitchen FLUoxetine (PROZAC) 10 MG capsule Take 10 mg by mouth daily.    . furosemide (LASIX) 20 MG tablet Take 1 tablet (20 mg total) by mouth daily. 30 tablet 0  . gabapentin (NEURONTIN) 300 MG capsule Take 1 capsule (300 mg total) by mouth 2 (two) times daily. Pt takes two capsules in the morning and three at bedtime.    Marland Kitchen glimepiride (AMARYL) 4 MG tablet Take 4 mg by mouth daily with breakfast.    . Glycopyrrolate-Formoterol (BEVESPI IN) Inhale 1 puff into the lungs daily.     . Insulin Glargine (TOUJEO SOLOSTAR) 300 UNIT/ML SOPN Inject 10 Units into the skin daily as needed (for BS greater than 200).    Marland Kitchen levofloxacin (LEVAQUIN) 500 MG tablet Take 1 tablet (500 mg total) by mouth daily. 5 tablet 0  . metFORMIN (GLUCOPHAGE)  500 MG tablet Take 500 mg by mouth 2 (two) times daily with a meal.    . mometasone-formoterol (DULERA) 200-5 MCG/ACT AERO Inhale 2 puffs into the lungs 2 (two) times daily.    . nitroGLYCERIN (NITROSTAT) 0.4 MG SL tablet Place 0.4 mg under the tongue every 5 (five) minutes as needed for chest pain.    . pioglitazone (ACTOS) 30 MG tablet Take 30 mg by mouth daily.    . QUEtiapine (SEROQUEL) 200 MG tablet Take 1 tablet (200 mg total) by mouth at bedtime. 300 tablet 0  . senna-docusate (SENOKOT-S) 8.6-50 MG tablet Take 1 tablet by mouth at bedtime as needed for mild constipation.    Marland Kitchen  Tamsulosin HCl (FLOMAX) 0.4 MG CAPS Take 0.4 mg by mouth 2 (two) times daily.     . triazolam (HALCION) 0.25 MG tablet Take 0.25 mg by mouth at bedtime as needed for sleep.     No current facility-administered medications for this visit.     ROS: See HPI for pertinent positives and negatives.   Physical Examination  Vitals:   07/12/16 1051 07/12/16 1124  BP: 115/82 116/79  Pulse: (!) 103 (!) 102  Temp: 98.1 F (36.7 C)   SpO2: 95% 95%  Height: 6\' 3"  (1.905 m)    There is no height or weight on file to calculate BMI.  General: A&O x 3, WDWN, obese male seated in motorized w/c. Gait: seated in motorized w/c Eyes: PERRLA. Pulmonary: Respirations are slightly labored at rest, limited air movement in all fields, few wheezes and rhonchi Cardiac: regular rhythm, heart sounds are distant, no detected murmur.         Carotid Bruits Right Left   Negative Negative  Aorta is not palpable. Radial pulses: 2+ palpable                            VASCULAR EXAM: Extremities without ischemic changes, without Gangrene; without open wounds. Left AKA. Right lower leg, ankle, and foot with 1-3+ pitting edema. Right tibial area with mild erythema. Dorsal aspect of right foot with moderate erythematous patch in the center, very tender to touch.                                                                                                            LE Pulses Right Left       FEMORAL  not palpable seated in motorized w/c  not palpable        POPLITEAL  not palpable   AKA       DORSALIS PEDIS      ANTERIOR TIBIAL not palpable  AKA    Abdomen: soft, NT, no palpable, masses, large panus. Skin: no rashes, no ulcers, see Extremities. Musculoskeletal: no muscle wasting or atrophy, see Extremities.  Neurologic: A&O X 3; Appropriate Affect ; SENSATION: normal; MOTOR FUNCTION:  moving all extremities equally, motor strength 5/5 throughout. Speech is fluent/normal. CN 2-12 intact.     ASSESSMENT: [name] is a 75 y.o. male who is s/p stenting of his right superficial femoral artery on 07/09/2013. He has a chronic contracture of his right leg;  he has a left above-knee amputation but has not been able to use his prosthesis. He returns today with a 1-4 weeks history of right foot pain, swelling, and redness.  His atherosclerotic risk factors include 57 years of smoking (1 ppd currently), currently controlled DM, immobility, CAD, and COPD.   Face to face time with patient was 25 minutes. Over 50% of this time was spent on counseling and coordination of care.    DATA Right LE arterial duplex today demonstrates limited visualization of the right SFA due to acoustic shadowing from partially  occlusive, calcific plaque formations vs calcific vessels. Patent right proximal to mid SFA stents. Left AKA. Right ABI is 0.79 today, compared to last ABI of 0.93 on 12/26/14. Waveforms today declined to monophasic, were triphasic on 12/26/14. Right TBI today has a dampened waveform.   Right LE venous duplex today demonstrates no evidence of DVT nor superficial thrombophlebitis. Unable to adequately visualize the calf veins due to edema.   Dr. Donnetta Hutching spoke with pt and wife and examined pt.  Pt states he has taken Cipro before with no problems.  Cellulitis vs abscess vs osteomyelitis left foot:  #1 Cipro 500 mg bid x 2  weeks, disp #28, 0 refills #2 MRI of right foot, pt prefers this to be done at [facility], send results to me #3 Follow up with Dr. Gerarda Fraction  PLAN:  The patient was counseled re smoking cessation and given several free resources re smoking cessation.  Based on the patient's vascular studies and examination, pt will return to clinic in 6 months with right ABI and right LE arterial duplex.   I discussed in depth with the patient the nature of atherosclerosis, and emphasized the importance of maximal medical management including strict control of blood pressure, blood glucose, and lipid levels, obtaining regular exercise, and cessation of smoking.  The patient is aware that without maximal medical management the underlying atherosclerotic disease process will progress, limiting the benefit of any interventions.  The patient was given information about PAD including signs, symptoms, treatment, what symptoms should prompt the patient to seek immediate medical care, and risk reduction measures to take.  Clemon Chambers, RN, MSN, FNP-C [facility] [employer] Phone: [phone]  Clinic MD: Early  07/12/16 11:47 AM

## 2016-07-12 NOTE — Patient Instructions (Signed)
Peripheral Vascular Disease Peripheral vascular disease (PVD) is a disease of the blood vessels that are not part of your heart and brain. A simple term for PVD is poor circulation. In most cases, PVD narrows the blood vessels that carry blood from your heart to the rest of your body. This can result in a decreased supply of blood to your arms, legs, and internal organs, like your stomach or kidneys. However, it most often affects a person's lower legs and feet. There are two types of PVD.  Organic PVD. This is the more common type. It is caused by damage to the structure of blood vessels.  Functional PVD. This is caused by conditions that make blood vessels contract and tighten (spasm). Without treatment, PVD tends to get worse over time. PVD can also lead to acute ischemic limb. This is when an arm or limb suddenly has trouble getting enough blood. This is a medical emergency. CAUSES Each type of PVD has many different causes. The most common cause of PVD is buildup of a fatty material (plaque) inside of your arteries (atherosclerosis). Small amounts of plaque can break off from the walls of the blood vessels and become lodged in a smaller artery. This blocks blood flow and can cause acute ischemic limb. Other common causes of PVD include:  Blood clots that form inside of blood vessels.  Injuries to blood vessels.  Diseases that cause inflammation of blood vessels or cause blood vessel spasms.  Health behaviors and health history that increase your risk of developing PVD. RISK FACTORS  You may have a greater risk of PVD if you:  Have a family history of PVD.  Have certain medical conditions, including:  High cholesterol.  Diabetes.  High blood pressure (hypertension).  Coronary heart disease.  Past problems with blood clots.  Past injury, such as burns or a broken bone. These may have damaged blood vessels in your limbs.  Buerger disease. This is caused by inflamed blood  vessels in your hands and feet.  Some forms of arthritis.  Rare birth defects that affect the arteries in your legs.  Use tobacco.  Do not get enough exercise.  Are obese.  Are age 50 or older. SIGNS AND SYMPTOMS  PVD may cause many different symptoms. Your symptoms depend on what part of your body is not getting enough blood. Some common signs and symptoms include:  Cramps in your lower legs. This may be a symptom of poor leg circulation (claudication).  Pain and weakness in your legs while you are physically active that goes away when you rest (intermittent claudication).  Leg pain when at rest.  Leg numbness, tingling, or weakness.  Coldness in a leg or foot, especially when compared with the other leg.  Skin or hair changes. These can include:  Hair loss.  Shiny skin.  Pale or bluish skin.  Thick toenails.  Inability to get or maintain an erection (erectile dysfunction). People with PVD are more prone to developing ulcers and sores on their toes, feet, or legs. These may take longer than normal to heal. DIAGNOSIS Your health care provider may diagnose PVD from your signs and symptoms. The health care provider will also do a physical exam. You may have tests to find out what is causing your PVD and determine its severity. Tests may include:  Blood pressure recordings from your arms and legs and measurements of the strength of your pulses (pulse volume recordings).  Imaging studies using sound waves to take pictures of   the blood flow through your blood vessels (Doppler ultrasound).  Injecting a dye into your blood vessels before having imaging studies using:  X-rays (angiogram or arteriogram).  Computer-generated X-rays (CT angiogram).  A powerful electromagnetic field and a computer (magnetic resonance angiogram or MRA). TREATMENT Treatment for PVD depends on the cause of your condition and the severity of your symptoms. It also depends on your age. Underlying  causes need to be treated and controlled. These include long-lasting (chronic) conditions, such as diabetes, high cholesterol, and high blood pressure. You may need to first try making lifestyle changes and taking medicines. Surgery may be needed if these do not work. Lifestyle changes may include:  Quitting smoking.  Exercising regularly.  Following a low-fat, low-cholesterol diet. Medicines may include:  Blood thinners to prevent blood clots.  Medicines to improve blood flow.  Medicines to improve your blood cholesterol levels. Surgical procedures may include:  A procedure that uses an inflated balloon to open a blocked artery and improve blood flow (angioplasty).  A procedure to put in a tube (stent) to keep a blocked artery open (stent implant).  Surgery to reroute blood flow around a blocked artery (peripheral bypass surgery).  Surgery to remove dead tissue from an infected wound on the affected limb.  Amputation. This is surgical removal of the affected limb. This may be necessary in cases of acute ischemic limb that are not improved through medical or surgical treatments. HOME CARE INSTRUCTIONS  Take medicines only as directed by your health care provider.  Do not use any tobacco products, including cigarettes, chewing tobacco, or electronic cigarettes. If you need help quitting, ask your health care provider.  Lose weight if you are overweight, and maintain a healthy weight as directed by your health care provider.  Eat a diet that is low in fat and cholesterol. If you need help, ask your health care provider.  Exercise regularly. Ask your health care provider to suggest some good activities for you.  Use compression stockings or other mechanical devices as directed by your health care provider.  Take good care of your feet.  Wear comfortable shoes that fit well.  Check your feet often for any cuts or sores. SEEK MEDICAL CARE IF:  You have cramps in your legs  while walking.  You have leg pain when you are at rest.  You have coldness in a leg or foot.  Your skin changes.  You have erectile dysfunction.  You have cuts or sores on your feet that are not healing. SEEK IMMEDIATE MEDICAL CARE IF:  Your arm or leg turns cold and blue.  Your arms or legs become red, warm, swollen, painful, or numb.  You have chest pain or trouble breathing.  You suddenly have weakness in your face, arm, or leg.  You become very confused or lose the ability to speak.  You suddenly have a very bad headache or lose your vision.   This information is not intended to replace advice given to you by your health care provider. Make sure you discuss any questions you have with your health care provider.   Document Released: 09/29/2004 Document Revised: 09/12/2014 Document Reviewed: 01/30/2014 Elsevier Interactive Patient Education 2016 [employer]     Steps to Quit Smoking  Smoking tobacco can be harmful to your health and can affect almost every organ in your body. Smoking puts you, and those around you, at risk for developing many serious chronic diseases. Quitting smoking is difficult, but it is one of   the best things that you can do for your health. It is never too late to quit. WHAT ARE THE BENEFITS OF QUITTING SMOKING? When you quit smoking, you lower your risk of developing serious diseases and conditions, such as:  Lung cancer or lung disease, such as COPD.  Heart disease.  Stroke.  Heart attack.  Infertility.  Osteoporosis and bone fractures. Additionally, symptoms such as coughing, wheezing, and shortness of breath may get better when you quit. You may also find that you get sick less often because your body is stronger at fighting off colds and infections. If you are pregnant, quitting smoking can help to reduce your chances of having a baby of low birth weight. HOW DO I GET READY TO QUIT? When you decide to quit smoking, create a plan to  make sure that you are successful. Before you quit:  Pick a date to quit. Set a date within the next two weeks to give you time to prepare.  Write down the reasons why you are quitting. Keep this list in places where you will see it often, such as on your bathroom mirror or in your car or wallet.  Identify the people, places, things, and activities that make you want to smoke (triggers) and avoid them. Make sure to take these actions:  Throw away all cigarettes at home, at work, and in your car.  Throw away smoking accessories, such as ashtrays and lighters.  Clean your car and make sure to empty the ashtray.  Clean your home, including curtains and carpets.  Tell your family, friends, and coworkers that you are quitting. Support from your loved ones can make quitting easier.  Talk with your health care provider about your options for quitting smoking.  Find out what treatment options are covered by your health insurance. WHAT STRATEGIES CAN I USE TO QUIT SMOKING?  Talk with your healthcare provider about different strategies to quit smoking. Some strategies include:  Quitting smoking altogether instead of gradually lessening how much you smoke over a period of time. Research shows that quitting "cold turkey" is more successful than gradually quitting.  Attending in-person counseling to help you build problem-solving skills. You are more likely to have success in quitting if you attend several counseling sessions. Even short sessions of 10 minutes can be effective.  Finding resources and support systems that can help you to quit smoking and remain smoke-free after you quit. These resources are most helpful when you use them often. They can include:  Online chats with a counselor.  Telephone quitlines.  Printed self-help materials.  Support groups or group counseling.  Text messaging programs.  Mobile phone applications.  Taking medicines to help you quit smoking. (If you are  pregnant or breastfeeding, talk with your health care provider first.) Some medicines contain nicotine and some do not. Both types of medicines help with cravings, but the medicines that include nicotine help to relieve withdrawal symptoms. Your health care provider may recommend:  Nicotine patches, gum, or lozenges.  Nicotine inhalers or sprays.  Non-nicotine medicine that is taken by mouth. Talk with your health care provider about combining strategies, such as taking medicines while you are also receiving in-person counseling. Using these two strategies together makes you more likely to succeed in quitting than if you used either strategy on its own. If you are pregnant or breastfeeding, talk with your health care provider about finding counseling or other support strategies to quit smoking. Do not take medicine to help you   quit smoking unless told to do so by your health care provider. WHAT THINGS CAN I DO TO MAKE IT EASIER TO QUIT? Quitting smoking might feel overwhelming at first, but there is a lot that you can do to make it easier. Take these important actions:  Reach out to your family and friends and ask that they support and encourage you during this time. Call telephone quitlines, reach out to support groups, or work with a counselor for support.  Ask people who smoke to avoid smoking around you.  Avoid places that trigger you to smoke, such as bars, parties, or smoke-break areas at work.  Spend time around people who do not smoke.  Lessen stress in your life, because stress can be a smoking trigger for some people. To lessen stress, try:  Exercising regularly.  Deep-breathing exercises.  Yoga.  Meditating.  Performing a body scan. This involves closing your eyes, scanning your body from head to toe, and noticing which parts of your body are particularly tense. Purposefully relax the muscles in those areas.  Download or purchase mobile phone or tablet apps (applications)  that can help you stick to your quit plan by providing reminders, tips, and encouragement. There are many free apps, such as QuitGuide from the CDC (Centers for Disease Control and Prevention). You can find other support for quitting smoking (smoking cessation) through smokefree.gov and other websites. HOW WILL I FEEL WHEN I QUIT SMOKING? Within the first 24 hours of quitting smoking, you may start to feel some withdrawal symptoms. These symptoms are usually most noticeable 2-3 days after quitting, but they usually do not last beyond 2-3 weeks. Changes or symptoms that you might experience include:  Mood swings.  Restlessness, anxiety, or irritation.  Difficulty concentrating.  Dizziness.  Strong cravings for sugary foods in addition to nicotine.  Mild weight gain.  Constipation.  Nausea.  Coughing or a sore throat.  Changes in how your medicines work in your body.  A depressed mood.  Difficulty sleeping (insomnia). After the first 2-3 weeks of quitting, you may start to notice more positive results, such as:  Improved sense of smell and taste.  Decreased coughing and sore throat.  Slower heart rate.  Lower blood pressure.  Clearer skin.  The ability to breathe more easily.  Fewer sick days. Quitting smoking is very challenging for most people. Do not get discouraged if you are not successful the first time. Some people need to make many attempts to quit before they achieve long-term success. Do your best to stick to your quit plan, and talk with your health care provider if you have any questions or concerns.   This information is not intended to replace advice given to you by your health care provider. Make sure you discuss any questions you have with your health care provider.   Document Released: 08/16/2001 Document Revised: 01/06/2015 Document Reviewed: 01/06/2015 Elsevier Interactive Patient Education 2016 [employer] Inc[employer]  

## 2016-07-13 ENCOUNTER — Other Ambulatory Visit: Payer: Self-pay | Admitting: *Deleted

## 2016-07-13 DIAGNOSIS — I739 Peripheral vascular disease, unspecified: Secondary | ICD-10-CM

## 2016-07-15 ENCOUNTER — Other Ambulatory Visit: Payer: Self-pay | Admitting: Family

## 2016-07-15 ENCOUNTER — Ambulatory Visit (HOSPITAL_COMMUNITY)
Admission: RE | Admit: 2016-07-15 | Discharge: 2016-07-15 | Disposition: A | Discharge status: Home / Self Care | Payer: Medicare Other | Source: Ambulatory Visit | Attending: Family | Admitting: Family

## 2016-07-15 DIAGNOSIS — I739 Peripheral vascular disease, unspecified: Secondary | ICD-10-CM

## 2016-07-15 DIAGNOSIS — M7989 Other specified soft tissue disorders: Secondary | ICD-10-CM | POA: Diagnosis not present

## 2016-07-15 DIAGNOSIS — S92514A Nondisplaced fracture of proximal phalanx of right lesser toe(s), initial encounter for closed fracture: Secondary | ICD-10-CM | POA: Diagnosis not present

## 2016-07-15 DIAGNOSIS — L02611 Cutaneous abscess of right foot: Secondary | ICD-10-CM | POA: Diagnosis not present

## 2016-07-20 DIAGNOSIS — S92514A Nondisplaced fracture of proximal phalanx of right lesser toe(s), initial encounter for closed fracture: Secondary | ICD-10-CM | POA: Diagnosis not present

## 2016-07-26 ENCOUNTER — Other Ambulatory Visit: Payer: Self-pay

## 2016-08-30 DIAGNOSIS — R069 Unspecified abnormalities of breathing: Secondary | ICD-10-CM | POA: Diagnosis not present

## 2016-09-16 DIAGNOSIS — J449 Chronic obstructive pulmonary disease, unspecified: Secondary | ICD-10-CM | POA: Diagnosis not present

## 2016-09-20 DIAGNOSIS — Z89611 Acquired absence of right leg above knee: Secondary | ICD-10-CM | POA: Diagnosis not present

## 2016-09-20 DIAGNOSIS — E1151 Type 2 diabetes mellitus with diabetic peripheral angiopathy without gangrene: Secondary | ICD-10-CM | POA: Diagnosis not present

## 2016-09-20 DIAGNOSIS — Z681 Body mass index (BMI) 19 or less, adult: Secondary | ICD-10-CM | POA: Diagnosis not present

## 2016-09-20 DIAGNOSIS — R600 Localized edema: Secondary | ICD-10-CM | POA: Diagnosis not present

## 2016-09-20 DIAGNOSIS — J449 Chronic obstructive pulmonary disease, unspecified: Secondary | ICD-10-CM | POA: Diagnosis not present

## 2016-09-20 DIAGNOSIS — E669 Obesity, unspecified: Secondary | ICD-10-CM | POA: Diagnosis not present

## 2016-09-20 DIAGNOSIS — G894 Chronic pain syndrome: Secondary | ICD-10-CM | POA: Diagnosis not present

## 2016-09-20 DIAGNOSIS — M79644 Pain in right finger(s): Secondary | ICD-10-CM | POA: Diagnosis not present

## 2016-09-20 DIAGNOSIS — Z1389 Encounter for screening for other disorder: Secondary | ICD-10-CM | POA: Diagnosis not present

## 2016-11-10 DIAGNOSIS — F5101 Primary insomnia: Secondary | ICD-10-CM | POA: Diagnosis not present

## 2016-11-10 DIAGNOSIS — E1165 Type 2 diabetes mellitus with hyperglycemia: Secondary | ICD-10-CM | POA: Diagnosis not present

## 2016-11-10 DIAGNOSIS — F17210 Nicotine dependence, cigarettes, uncomplicated: Secondary | ICD-10-CM | POA: Diagnosis not present

## 2016-11-10 DIAGNOSIS — J449 Chronic obstructive pulmonary disease, unspecified: Secondary | ICD-10-CM | POA: Diagnosis not present

## 2016-11-15 DIAGNOSIS — E782 Mixed hyperlipidemia: Secondary | ICD-10-CM | POA: Diagnosis not present

## 2016-11-15 DIAGNOSIS — R7301 Impaired fasting glucose: Secondary | ICD-10-CM | POA: Diagnosis not present

## 2016-11-19 DIAGNOSIS — E876 Hypokalemia: Secondary | ICD-10-CM | POA: Diagnosis not present

## 2016-11-19 DIAGNOSIS — Z89512 Acquired absence of left leg below knee: Secondary | ICD-10-CM | POA: Diagnosis not present

## 2016-11-19 DIAGNOSIS — I7091 Generalized atherosclerosis: Secondary | ICD-10-CM | POA: Diagnosis not present

## 2016-11-19 DIAGNOSIS — Z0000 Encounter for general adult medical examination without abnormal findings: Secondary | ICD-10-CM | POA: Diagnosis not present

## 2016-11-19 DIAGNOSIS — F17210 Nicotine dependence, cigarettes, uncomplicated: Secondary | ICD-10-CM | POA: Diagnosis not present

## 2016-11-19 DIAGNOSIS — F5101 Primary insomnia: Secondary | ICD-10-CM | POA: Diagnosis not present

## 2016-11-19 DIAGNOSIS — J449 Chronic obstructive pulmonary disease, unspecified: Secondary | ICD-10-CM | POA: Diagnosis not present

## 2016-11-19 DIAGNOSIS — E1165 Type 2 diabetes mellitus with hyperglycemia: Secondary | ICD-10-CM | POA: Diagnosis not present

## 2016-12-06 DIAGNOSIS — M1000 Idiopathic gout, unspecified site: Secondary | ICD-10-CM | POA: Diagnosis not present

## 2016-12-06 DIAGNOSIS — E1165 Type 2 diabetes mellitus with hyperglycemia: Secondary | ICD-10-CM | POA: Diagnosis not present

## 2016-12-29 ENCOUNTER — Encounter: Payer: Self-pay | Admitting: Family

## 2016-12-30 DIAGNOSIS — E1165 Type 2 diabetes mellitus with hyperglycemia: Secondary | ICD-10-CM | POA: Diagnosis not present

## 2016-12-30 DIAGNOSIS — F5101 Primary insomnia: Secondary | ICD-10-CM | POA: Diagnosis not present

## 2016-12-30 DIAGNOSIS — J449 Chronic obstructive pulmonary disease, unspecified: Secondary | ICD-10-CM | POA: Diagnosis not present

## 2017-01-10 ENCOUNTER — Ambulatory Visit: Payer: Medicare Other | Admitting: Family

## 2017-01-10 ENCOUNTER — Ambulatory Visit (HOSPITAL_COMMUNITY): Payer: Medicare Other

## 2017-01-13 DIAGNOSIS — E1165 Type 2 diabetes mellitus with hyperglycemia: Secondary | ICD-10-CM | POA: Diagnosis not present

## 2017-01-13 DIAGNOSIS — F439 Reaction to severe stress, unspecified: Secondary | ICD-10-CM | POA: Diagnosis not present

## 2017-02-10 DIAGNOSIS — R739 Hyperglycemia, unspecified: Secondary | ICD-10-CM | POA: Diagnosis not present

## 2017-02-10 DIAGNOSIS — R079 Chest pain, unspecified: Secondary | ICD-10-CM | POA: Diagnosis not present

## 2017-02-10 DIAGNOSIS — E1165 Type 2 diabetes mellitus with hyperglycemia: Secondary | ICD-10-CM | POA: Diagnosis not present

## 2017-02-10 DIAGNOSIS — J449 Chronic obstructive pulmonary disease, unspecified: Secondary | ICD-10-CM | POA: Diagnosis not present

## 2017-02-13 ENCOUNTER — Ambulatory Visit: Payer: Medicare Other | Admitting: Nutrition

## 2017-02-15 ENCOUNTER — Encounter: Payer: Medicare Other | Attending: Internal Medicine | Admitting: Nutrition

## 2017-02-15 DIAGNOSIS — Z713 Dietary counseling and surveillance: Secondary | ICD-10-CM | POA: Insufficient documentation

## 2017-02-15 DIAGNOSIS — Z993 Dependence on wheelchair: Secondary | ICD-10-CM | POA: Insufficient documentation

## 2017-02-15 DIAGNOSIS — E162 Hypoglycemia, unspecified: Secondary | ICD-10-CM | POA: Diagnosis not present

## 2017-02-15 NOTE — Patient Instructions (Addendum)
Goals Eat 5-6 small meals per day Do not drink sodas or eat sweets, cakes, dessert due to making your breathing worse along with higher blood sugars. Eat foods that are easily chewed and swallowed- chop or mince meats. Drink more water Increase fresh fruits and low carb vegetables  May drink a Glucerna in place of a meal with fruit or a salad if desired. Try to not skip meals.

## 2017-02-15 NOTE — Progress Notes (Signed)
Medical Nutrition Therapy:  Appt start time: 1000 end time:  1100.   Assessment:  Primary concerns today: Diabetes Type 2. He is in a wheel chair. Has a L AKA. Lives with his wife. His wife does the cooking. PMH: COPD, Type 2 DM, Cabg x 5, Smoker, See HIstory. Unknown A1C. Sees Dr. [place] Crane for PCP. Has been put on nebulizer treatments recently due to congestion in his chest, difficulty breathing. Sounds very congested and rattly today. Complains of weight loss. Unable to weigh because he can't stand. He notes his BS are up and down. Chronic fatigue. No appetite. Reports weight loss. Says  " it feels like I am dying."  Reported chest pain, left arm weakness and N/V last night and threw up. He took a Nitro pill and symptoms subsided so he didn't go to ER. He reports he is tired of hospitals..   BS meter brought in. BS AVg from machine  115-122 mg/dl.   He notes he only takes 10 units of Toujeo if his blood sugar is above 200 mg/dl before bed. If he takes insulin less than that, he reports hypoglycemia and having to drink orange juice to bring it back up.    He is only eating 1-2 times per day. No appetite. Willing to drink a Glucerna for a meal if he has to. Breathing issues interfere with appetite and causing chronic fatigue.     Diet is inconsistent to meet his needs and control his blood sugar. He is willing to try to eat better balanced meals and 4-5 small meals a day.    Preferred Learning Style:   No preference indicated   Learning Readiness:  Ready  Change in progress   MEDICATIONS: see list   DIETARY INTAKE:  Eats 1-2 times per day. Doesn't get up til 11 am, may eat 1/2 sandwich, and a little dinner.  No appetite.  Usual physical activity: Wheelchair bound  Estimated energy needs: 2000- 2200 calories 225 g carbohydrates 150 g protein 56 g fat  Progress Towards Goal(s):  In progress.   Nutritional Diagnosis:  NB-1.1 Food and nutrition-related knowledge deficit As related to  Diabetes.  As evidenced by A1C > 6.5%.    Intervention:  Nutrition and Diabetes education provided on My Plate, CHO counting, meal planning, portion sizes, timing of meals, avoiding snacks between meals unless having a low blood sugar, target ranges for A1C and blood sugars, signs/symptoms and treatment of hyper/hypoglycemia, monitoring blood sugars, taking medications as prescribed, benefits of exercising 30 minutes per day and prevention of complications of DM. Review diet for COPD, small frequent meals. Stressed need to avoid concentrated sweets due to C02 production and making breathing worse.   Goals Eat 5-6 small meals per day Do not drink sodas or eat sweets, cakes, dessert due to making your breathing worse along with higher blood sugars. Eat foods that are easily chewed and swallowed- chop or mince meats. Drink more water Increase fresh fruits and low carb vegetables  May drink a Glucerna in place of a meal with fruit or a salad if desired. Try to not skip meals.   Teaching Method Utilized:  Visual Auditory Hands on  Handouts given during visit include:  The Plate Method   Meal Plan Card  COPD Nutrition Guidelines.   Barriers to learning/adherence to lifestyle change: COPD, wheelchair bound  Demonstrated degree of understanding via:  Teach Back   Monitoring/Evaluation:  Dietary intake, exercise, meal planning, SBG, and body weight in 1 month(s).

## 2017-02-27 DIAGNOSIS — D510 Vitamin B12 deficiency anemia due to intrinsic factor deficiency: Secondary | ICD-10-CM | POA: Diagnosis not present

## 2017-02-27 DIAGNOSIS — I110 Hypertensive heart disease with heart failure: Secondary | ICD-10-CM | POA: Diagnosis not present

## 2017-02-27 DIAGNOSIS — M545 Low back pain: Secondary | ICD-10-CM | POA: Diagnosis not present

## 2017-02-27 DIAGNOSIS — E559 Vitamin D deficiency, unspecified: Secondary | ICD-10-CM | POA: Diagnosis not present

## 2017-02-27 DIAGNOSIS — G609 Hereditary and idiopathic neuropathy, unspecified: Secondary | ICD-10-CM | POA: Diagnosis not present

## 2017-02-27 DIAGNOSIS — R5383 Other fatigue: Secondary | ICD-10-CM | POA: Diagnosis not present

## 2017-02-27 DIAGNOSIS — Z125 Encounter for screening for malignant neoplasm of prostate: Secondary | ICD-10-CM | POA: Diagnosis not present

## 2017-02-27 DIAGNOSIS — E1165 Type 2 diabetes mellitus with hyperglycemia: Secondary | ICD-10-CM | POA: Diagnosis not present

## 2017-02-27 DIAGNOSIS — M2550 Pain in unspecified joint: Secondary | ICD-10-CM | POA: Diagnosis not present

## 2017-02-27 DIAGNOSIS — J449 Chronic obstructive pulmonary disease, unspecified: Secondary | ICD-10-CM | POA: Diagnosis not present

## 2017-02-27 DIAGNOSIS — E784 Other hyperlipidemia: Secondary | ICD-10-CM | POA: Diagnosis not present

## 2017-03-10 ENCOUNTER — Encounter: Payer: Self-pay | Admitting: Cardiology

## 2017-03-31 ENCOUNTER — Ambulatory Visit (HOSPITAL_COMMUNITY)
Admission: RE | Admit: 2017-03-31 | Discharge: 2017-03-31 | Disposition: A | Discharge status: Home / Self Care | Payer: Medicare Other | Source: Ambulatory Visit | Attending: Family Medicine | Admitting: Family Medicine

## 2017-03-31 ENCOUNTER — Other Ambulatory Visit (HOSPITAL_COMMUNITY): Payer: Self-pay | Admitting: Family Medicine

## 2017-03-31 DIAGNOSIS — M7062 Trochanteric bursitis, left hip: Secondary | ICD-10-CM | POA: Insufficient documentation

## 2017-03-31 DIAGNOSIS — I7090 Unspecified atherosclerosis: Secondary | ICD-10-CM | POA: Insufficient documentation

## 2017-03-31 DIAGNOSIS — M7061 Trochanteric bursitis, right hip: Secondary | ICD-10-CM | POA: Insufficient documentation

## 2017-03-31 DIAGNOSIS — M25551 Pain in right hip: Secondary | ICD-10-CM | POA: Diagnosis not present

## 2017-03-31 DIAGNOSIS — M25552 Pain in left hip: Secondary | ICD-10-CM | POA: Diagnosis not present

## 2017-04-03 DIAGNOSIS — M545 Low back pain: Secondary | ICD-10-CM | POA: Diagnosis not present

## 2017-04-03 DIAGNOSIS — I119 Hypertensive heart disease without heart failure: Secondary | ICD-10-CM | POA: Diagnosis not present

## 2017-04-03 DIAGNOSIS — R5382 Chronic fatigue, unspecified: Secondary | ICD-10-CM | POA: Diagnosis not present

## 2017-04-03 DIAGNOSIS — M2550 Pain in unspecified joint: Secondary | ICD-10-CM | POA: Diagnosis not present

## 2017-04-19 ENCOUNTER — Other Ambulatory Visit: Payer: Self-pay | Admitting: Internal Medicine

## 2017-04-19 ENCOUNTER — Other Ambulatory Visit (HOSPITAL_COMMUNITY): Payer: Self-pay | Admitting: Family Medicine

## 2017-04-19 DIAGNOSIS — M545 Low back pain: Secondary | ICD-10-CM

## 2017-04-19 DIAGNOSIS — R5382 Chronic fatigue, unspecified: Secondary | ICD-10-CM | POA: Diagnosis not present

## 2017-04-19 DIAGNOSIS — M2550 Pain in unspecified joint: Secondary | ICD-10-CM | POA: Diagnosis not present

## 2017-04-19 DIAGNOSIS — M7062 Trochanteric bursitis, left hip: Secondary | ICD-10-CM | POA: Diagnosis not present

## 2017-04-21 ENCOUNTER — Other Ambulatory Visit: Payer: Medicare Other

## 2017-04-24 ENCOUNTER — Telehealth: Payer: Self-pay

## 2017-04-24 NOTE — Telephone Encounter (Signed)
Pt called in today with c/o burning/stabing pains in L AKA, pain is a constant 8/10 after taking oxycodone with very little relief at all during the day, also becomes cold at times. States this has been going on for about 3 weeks and seems to be getting worse. Denies fever but states he has chills at times,does not have an appetite and has been experiencing some vomiting. After speaking with [facility] in the office today he recommended him to go to the ER. Gave instructions to pt to go to the ER today. Pt gave verbal understanding and agreed with this plan. Stated he would try to go today if not he would go first thing in the morning.

## 2017-04-25 ENCOUNTER — Ambulatory Visit (HOSPITAL_COMMUNITY): Payer: Medicare Other

## 2017-04-25 ENCOUNTER — Encounter (HOSPITAL_COMMUNITY): Payer: Self-pay

## 2017-05-03 ENCOUNTER — Encounter (HOSPITAL_COMMUNITY): Payer: Self-pay | Admitting: Radiology

## 2017-05-03 ENCOUNTER — Ambulatory Visit: Payer: Medicare Other | Admitting: Nutrition

## 2017-05-05 ENCOUNTER — Ambulatory Visit (HOSPITAL_COMMUNITY): Payer: Medicare Other

## 2017-05-09 ENCOUNTER — Emergency Department (HOSPITAL_COMMUNITY): Payer: Medicare Other

## 2017-05-09 ENCOUNTER — Emergency Department (HOSPITAL_COMMUNITY)
Admission: EM | Admit: 2017-05-09 | Discharge: 2017-05-09 | Disposition: A | Discharge status: Home / Self Care | Payer: Medicare Other | Attending: Emergency Medicine | Admitting: Emergency Medicine

## 2017-05-09 DIAGNOSIS — K5900 Constipation, unspecified: Secondary | ICD-10-CM

## 2017-05-09 DIAGNOSIS — I252 Old myocardial infarction: Secondary | ICD-10-CM | POA: Diagnosis not present

## 2017-05-09 DIAGNOSIS — J441 Chronic obstructive pulmonary disease with (acute) exacerbation: Secondary | ICD-10-CM | POA: Diagnosis not present

## 2017-05-09 DIAGNOSIS — F17210 Nicotine dependence, cigarettes, uncomplicated: Secondary | ICD-10-CM | POA: Diagnosis not present

## 2017-05-09 DIAGNOSIS — E1122 Type 2 diabetes mellitus with diabetic chronic kidney disease: Secondary | ICD-10-CM | POA: Diagnosis not present

## 2017-05-09 DIAGNOSIS — R1032 Left lower quadrant pain: Secondary | ICD-10-CM

## 2017-05-09 DIAGNOSIS — E1151 Type 2 diabetes mellitus with diabetic peripheral angiopathy without gangrene: Secondary | ICD-10-CM | POA: Diagnosis not present

## 2017-05-09 DIAGNOSIS — I129 Hypertensive chronic kidney disease with stage 1 through stage 4 chronic kidney disease, or unspecified chronic kidney disease: Secondary | ICD-10-CM | POA: Insufficient documentation

## 2017-05-09 DIAGNOSIS — R197 Diarrhea, unspecified: Secondary | ICD-10-CM | POA: Diagnosis not present

## 2017-05-09 DIAGNOSIS — R0682 Tachypnea, not elsewhere classified: Secondary | ICD-10-CM | POA: Diagnosis not present

## 2017-05-09 DIAGNOSIS — N183 Chronic kidney disease, stage 3 (moderate): Secondary | ICD-10-CM | POA: Diagnosis not present

## 2017-05-09 DIAGNOSIS — R1111 Vomiting without nausea: Secondary | ICD-10-CM | POA: Diagnosis not present

## 2017-05-09 DIAGNOSIS — J45909 Unspecified asthma, uncomplicated: Secondary | ICD-10-CM | POA: Insufficient documentation

## 2017-05-09 DIAGNOSIS — K5730 Diverticulosis of large intestine without perforation or abscess without bleeding: Secondary | ICD-10-CM | POA: Diagnosis not present

## 2017-05-09 LAB — COMPREHENSIVE METABOLIC PANEL
ALBUMIN: 3.9 g/dL (ref 3.5–5.0)
ALK PHOS: 49 U/L (ref 38–126)
ALT: 11 U/L — AB (ref 17–63)
ANION GAP: 11 (ref 5–15)
AST: 13 U/L — ABNORMAL LOW (ref 15–41)
BUN: 20 mg/dL (ref 6–20)
CHLORIDE: 97 mmol/L — AB (ref 101–111)
CO2: 25 mmol/L (ref 22–32)
CREATININE: 1.46 mg/dL — AB (ref 0.61–1.24)
Calcium: 9.4 mg/dL (ref 8.9–10.3)
GFR calc Af Amer: 53 mL/min — ABNORMAL LOW (ref >60)
GFR calc non Af Amer: 46 mL/min — ABNORMAL LOW (ref >60)
Glucose, Bld: 144 mg/dL — ABNORMAL HIGH (ref 65–99)
Potassium: 5 mmol/L (ref 3.5–5.1)
SODIUM: 133 mmol/L — AB (ref 135–145)
TOTAL PROTEIN: 7.2 g/dL (ref 6.5–8.1)
Total Bilirubin: 0.9 mg/dL (ref 0.3–1.2)

## 2017-05-09 LAB — CBC WITH DIFFERENTIAL/PLATELET
BASOS ABS: 0 10*3/uL (ref 0.0–0.1)
Basophils Relative: 0 %
EOS PCT: 3 %
Eosinophils Absolute: 0.2 10*3/uL (ref 0.0–0.7)
HCT: 46.5 % (ref 39.0–52.0)
Hemoglobin: 15.8 g/dL (ref 13.0–17.0)
LYMPHS PCT: 19 %
Lymphs Abs: 1.4 10*3/uL (ref 0.7–4.0)
MCH: 31.5 pg (ref 26.0–34.0)
MCHC: 34 g/dL (ref 30.0–36.0)
MCV: 92.8 fL (ref 78.0–100.0)
MONO ABS: 0.7 10*3/uL (ref 0.1–1.0)
Monocytes Relative: 9 %
Neutro Abs: 5.1 10*3/uL (ref 1.7–7.7)
Neutrophils Relative %: 69 %
PLATELETS: 200 10*3/uL (ref 150–400)
RBC: 5.01 MIL/uL (ref 4.22–5.81)
RDW: 13.8 % (ref 11.5–15.5)
WBC: 7.4 10*3/uL (ref 4.0–10.5)

## 2017-05-09 LAB — LIPASE, BLOOD: LIPASE: 18 U/L (ref 11–51)

## 2017-05-09 LAB — I-STAT CG4 LACTIC ACID, ED: LACTIC ACID, VENOUS: 1.94 mmol/L — AB (ref 0.5–1.9)

## 2017-05-09 MED ORDER — SODIUM CHLORIDE 0.9 % IV BOLUS (SEPSIS)
500.0000 mL | Freq: Once | INTRAVENOUS | Status: AC
  Administered 2017-05-09: 500 mL via INTRAVENOUS

## 2017-05-09 MED ORDER — ONDANSETRON 4 MG PO TBDP: 4.0000 mg | ORAL_TABLET | Freq: Three times a day (TID) | ORAL | 0 refills | Status: DC | PRN

## 2017-05-09 MED ORDER — TRAMADOL HCL 50 MG PO TABS: 50.0000 mg | ORAL_TABLET | Freq: Four times a day (QID) | ORAL | 0 refills | Status: DC | PRN

## 2017-05-09 MED ORDER — POLYETHYLENE GLYCOL 3350 17 G PO PACK: 17.0000 g | PACK | Freq: Every day | ORAL | 0 refills | Status: DC

## 2017-05-09 MED ORDER — ONDANSETRON HCL 4 MG/2ML IJ SOLN
4.0000 mg | Freq: Once | INTRAMUSCULAR | Status: AC
  Administered 2017-05-09: 4 mg via INTRAVENOUS
  Filled 2017-05-09: qty 2

## 2017-05-09 MED ORDER — HYDROMORPHONE HCL 1 MG/ML IJ SOLN
1.0000 mg | Freq: Once | INTRAMUSCULAR | Status: AC
  Administered 2017-05-09: 1 mg via INTRAVENOUS
  Filled 2017-05-09: qty 1

## 2017-05-09 NOTE — ED Notes (Signed)
Pt moved to a recliner at his request for comfort.

## 2017-05-09 NOTE — ED Provider Notes (Signed)
Emergency Department Provider Note   I have reviewed the triage vital signs and the nursing notes.   HISTORY  Chief Complaint Abdominal Pain   HPI ASTIN SAYRE is a 76 y.o. male with PMH of COPD, CAD s/p CABG, Left AKA with PVD, HTN, HLD, DM, and s/p left nephrectomy resents to the emergency department for evaluation of 4 days of severe, left-sided abdominal pain with constipation. Patient states that he's having associated nausea and vomiting. No BM in the past 2-3 days. No history of SBO but does note a pre-existing left abdominal hernia that is much larger than normal. No fever or chills. No CP or SOB. No dysuria, hesitancy, or urgency.   Past Medical History:  Diagnosis Date  . Anginal pain ([place])   . Arthritis    "qwhere" (07/09/2013)  . Asthma   . COPD (chronic obstructive pulmonary disease) ([place])    pt denies this hx on 07/09/2013  . Coronary artery disease   . Gout   . H/O hiatal hernia 1997  . Heart murmur    "had rheumatic fever as a kid" (07/09/2013)  . High cholesterol   . Hypertension   . Myocardial infarction ([place]) 03/2002; ~ 2011  . Pneumonia    "once" (07/09/2013)  . Renal disorder    "only have 1 kidney; due to go back soon to check the other one" (07/09/2013)  . Rheumatic fever   . RUPTURE ROTATOR CUFF 12/03/2009   Qualifier: Diagnosis of  By: Aline Brochure MD, Dorothyann Peng    . Shortness of breath    "can happen at anytime" (07/09/2013)  . Type II diabetes mellitus ([place])   . 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 CKD (chronic kidney disease), stage III 06/29/2016  . COPD exacerbation ([place]) 06/16/2016  . Hyperkalemia 11/24/2015  . UTI (lower urinary tract infection) 11/24/2015  . Acute UTI 09/11/2014  . Acute respiratory failure with hypercapnia ([place]) 09/10/2014  . Acute encephalopathy 09/10/2014  . Alcoholic dementia ([place]) 16/09/930  . Recurrent  falls 09/10/2014  . Left shoulder pain 09/10/2014  . CAD in native artery 09/10/2014  . Diabetes mellitus with peripheral vascular disease ([place]) 09/10/2014  . FTT (failure to thrive) in adult 09/10/2014  . Protein-calorie malnutrition ([place]) 09/10/2014  . Mixed acid base balance disorder 09/09/2014  . Acute kidney injury ([place]) 09/09/2014  . Hypotension 09/09/2014  . Fall 08/27/2014  . Altered mental status 08/26/2014  . Dehydration 08/26/2014  . Alcohol abuse 08/26/2014  . Hypertension 08/26/2014  . Aftercare following surgery of the circulatory system, [place] 08/05/2013  . Leg pain 07/01/2013  . Peripheral vascular disease ([place]) 05/27/2013  . Peripheral angiopathy in diseases classified elsewhere ([place]) 05/02/2012  . Diabetes ([place]) 05/02/2012  . Pain in limb 05/02/2012  . Chronic total occlusion of artery of the extremities ([place]) 05/02/2012  . JOINT EFFUSION, KNEE 12/03/2009  . RUPTURE ROTATOR CUFF 12/03/2009  . ISCHEMIC CARDIOMYOPATHY 08/24/2009  . CHF 08/24/2009  . BACK PAIN, LUMBAR, CHRONIC 08/24/2009    Past Surgical History:  Procedure Laterality Date  . ABOVE KNEE LEG AMPUTATION Left 09/2009  . BELOW KNEE LEG AMPUTATION Left 08/2009  . CARDIAC CATHETERIZATION  ~ 2010   "before the bypass" (07/09/2013)  . CARPAL TUNNEL RELEASE Bilateral ~2004; ~ 2011  . CHOLECYSTECTOMY  1980's  . CORONARY ARTERY BYPASS GRAFT  ~ 2010   "CABG X5" (  07/09/2013)  . EYE SURGERY    . FEMORAL ARTERY STENT Right 07/09/2013  . GLAUCOMA SURGERY Bilateral ?[id number]'X  . LOWER EXTREMITY ANGIOGRAM Right Nov. 4, 2014  . LOWER EXTREMITY ANGIOGRAM Right 07/09/2013   Procedure: LOWER EXTREMITY ANGIOGRAM;  Surgeon: Serafina Mitchell, MD;  Location: [facility] CATH LAB;  Service: Cardiovascular;  Laterality: Right;  . NEPHRECTOMY Left 12/19/2001  . PERIPHERAL VASCULAR CATHETERIZATION N/A 02/03/2015   Procedure: Abdominal Aortogram;  Surgeon: Serafina Mitchell, MD;  Location: [facility] CV LAB;  Service: Cardiovascular;   Laterality: N/A;  . TRIGGER FINGER RELEASE Left ~ 1990's    Current Outpatient Rx  . Order #: [id number] Class: Historical Med  . Order #: [id number] Class: Historical Med  . Order #: [id number] Class: Historical Med  . Order #: [id number] Class: Historical Med  . Order #: [id number] Class: No Print  . Order #: [id number] Class: Historical Med  . Order #: [id number] Class: Historical Med  . Order #: [id number] Class: Historical Med  . Order #: [id number] Class: Historical Med  . Order #: [id number] Class: Historical Med  . Order #: [id number] Class: Historical Med  . Order #: [id number] Class: Historical Med  . Order #: [id number] Class: Historical Med  . Order #: [id number] Class: Historical Med  . Order #: [id number] Class: Historical Med  . Order #: [id number] Class: Historical Med  . Order #: [id number] Class: Print  . Order #: [id number] Class: Print  . Order #: [id number] Class: Print    Allergies Erythromycin; Iohexol; and Penicillins  Family History  Problem Relation Age of Onset  . Cancer Mother   . Heart attack Father     Social History Social History  Substance Use Topics  . Smoking status: Current Every Day Smoker    Packs/day: 1.00    Years: 54.00    Types: Cigarettes  . Smokeless tobacco: Never Used  . Alcohol use 0.0 oz/week     Comment: social drinker    Review of Systems  Constitutional: No fever/chills Eyes: No visual changes. ENT: No sore throat. Cardiovascular: Denies chest pain. Respiratory: Denies shortness of breath. Gastrointestinal: Positive left sided abdominal pain. Positive nausea and vomiting.  No diarrhea. Positive constipation. Genitourinary: Negative for dysuria. Musculoskeletal: Negative for back pain. Skin: Negative for rash. Neurological: Negative for headaches, focal weakness or numbness.  10-point ROS otherwise negative.  ____________________________________________   PHYSICAL EXAM:  VITAL SIGNS: ED Triage Vitals  Enc Vitals Group     BP 05/09/17  1803 (!) 136/95     Pulse Rate 05/09/17 1803 95     Resp --      Temp 05/09/17 1803 97.8 F (36.6 C)     Temp Source 05/09/17 1803 Oral     SpO2 05/09/17 1803 98 %     Weight 05/09/17 1758 170 lb (77.1 kg)     Height 05/09/17 1758 6\' 3"  (1.905 m)     Pain Score 05/09/17 1757 8   Constitutional: Alert and oriented. Appears uncomfortable  Eyes: Conjunctivae are normal.  Head: Atraumatic. Nose: No congestion/rhinnorhea. Mouth/Throat: Mucous membranes are dry.  Neck: No stridor.   Cardiovascular: Normal rate, regular rhythm. Good peripheral circulation. Grossly normal heart sounds.   Respiratory: Normal respiratory effort.  No retractions. Lungs CTAB. Gastrointestinal: Soft with left sided tenderness. Left lateral abdominal mass with mild focal tenderness. No rebound. No obvious incarcerated hernia. No distention.  Musculoskeletal: No lower extremity tenderness nor edema. Well-appearing left AKA.  Neurologic:  Normal speech and language. No gross focal neurologic deficits are appreciated.  Skin:  Skin is warm, dry and intact. No  rash noted.  ____________________________________________   LABS (all labs ordered are listed, but only abnormal results are displayed)  Labs Reviewed  COMPREHENSIVE METABOLIC PANEL - Abnormal; Notable for the following:       Result Value   Sodium 133 (*)    Chloride 97 (*)    Glucose, Bld 144 (*)    Creatinine, Ser 1.46 (*)    AST 13 (*)    ALT 11 (*)    GFR calc non Af Amer 46 (*)    GFR calc Af Amer 53 (*)    All other components within normal limits  I-STAT CG4 LACTIC ACID, ED - Abnormal; Notable for the following:    Lactic Acid, Venous 1.94 (*)    All other components within normal limits  LIPASE, BLOOD  CBC WITH DIFFERENTIAL/PLATELET   ____________________________________________  RADIOLOGY  Ct Abdomen Pelvis Wo Contrast  Result Date: 05/09/2017 CLINICAL DATA:  Left lower quadrant pain with nausea and vomiting EXAM: CT ABDOMEN AND  PELVIS WITHOUT CONTRAST TECHNIQUE: Multidetector CT imaging of the abdomen and pelvis was performed following the standard protocol without IV contrast. COMPARISON:  Radiograph 05/09/2017, CT 09/01/2010 FINDINGS: Lower chest: Lung bases demonstrate dependent atelectasis. Mild coronary artery calcification. Mitral calcification. Hepatobiliary: No focal liver abnormality is seen. Status post cholecystectomy. No biliary dilatation. Pancreas: Unremarkable. No pancreatic ductal dilatation or surrounding inflammatory changes. Spleen: Normal in size without focal abnormality. Adrenals/Urinary Tract: Adrenal glands appear normal. Status post left nephrectomy. Punctate nonobstructing stone in the mid right kidney. No right hydronephrosis. Subcentimeter cortical hypodensity in the right kidney too small to characterize. The bladder is normal Stomach/Bowel: The stomach is nonenlarged. No dilated small bowel. Transverse lie of the colon. Normal midline appendix. Sigmoid colon diverticular disease without acute inflammation Vascular/Lymphatic: Extensive atherosclerotic calcification of the aorta and iliac vessels. No aneurysmal dilatation. No significantly enlarged abdominal or pelvic lymph nodes. Reproductive: Prostate is unremarkable. Other: Negative for free air or free fluid. Diastasis of the left abdominal musculature with small focal protrusion of intra-abdominal fat. Musculoskeletal: Mild fatty atrophy of the pelvic muscles. Treated compression at L1. Mild superior endplate compression at T11 and T12 unchanged. Schmorl's node inferior endplate of L4. No acute osseous abnormality. IMPRESSION: 1. Status post left nephrectomy. No left lower quadrant inflammatory process is seen 2. Punctate nonobstructing stone in the right kidney 3. Sigmoid colon diverticular disease without acute inflammation Electronically Signed   By: Donavan Foil M.D.   On: 05/09/2017 20:43   Dg Abdomen Acute W/chest  Result Date: 05/09/2017 CLINICAL  DATA:  Left lower quadrant abdominal pain EXAM: DG ABDOMEN ACUTE W/ 1V CHEST COMPARISON:  07/01/2016, 03/31/2017 FINDINGS: Single-view chest demonstrates median sternotomy changes. Linear atelectasis or scarring at the left lung base. Stable cardiomediastinal silhouette with aortic atherosclerosis. Supine and upright views of the abdomen demonstrate no free air beneath the diaphragm. Surgical clips in the upper abdomen. Nonobstructed gas pattern with moderate stool. No abnormal calcification. Vascular calcification. Partially visualized vascular stent in the right groin. Treated compression at L1. IMPRESSION: 1. Linear scarring or atelectasis at the left base 2. Nonobstructed bowel-gas pattern Electronically Signed   By: Donavan Foil M.D.   On: 05/09/2017 19:22    ____________________________________________   PROCEDURES  Procedure(s) performed:   Procedures  None ____________________________________________   INITIAL IMPRESSION / ASSESSMENT AND PLAN / ED COURSE  Pertinent labs & imaging results that were available during my care of the patient were reviewed by me and considered in my medical decision making (see  chart for details).  Patient resents to the emergency room in for evaluation of abdominal pain with nausea and vomiting associated constipation. Symptoms ongoing for the last 4 days. Patient does have bulging in the left side of his abdomen with no evidence of incarcerated or strangulated hernia. Patient has listed allergy to IV contrast. Also has history of peripheral vascular and kidney disease with solo kidney. Will avoid IV contrast but plan for acute abdomen plain film series and CT w/ oral contrast only for further evaluation.   Patient with negative CT abdomen. Plan for symptom mgmt at home and PCP/GI follow up.   At this time, I do not feel there is any life-threatening condition present. I have reviewed and discussed all results (EKG, imaging, lab, urine as appropriate),  exam findings with patient. I have reviewed nursing notes and appropriate previous records.  I feel the patient is safe to be discharged home without further emergent workup. Discussed usual and customary return precautions. Patient and family (if present) verbalize understanding and are comfortable with this plan.  Patient will follow-up with their primary care provider. If they do not have a primary care provider, information for follow-up has been provided to them. All questions have been answered.  ____________________________________________  FINAL CLINICAL IMPRESSION(S) / ED DIAGNOSES  Final diagnoses:  Left lower quadrant pain  Constipation, unspecified constipation type     MEDICATIONS GIVEN DURING THIS VISIT:  Medications  sodium chloride 0.9 % bolus 500 mL (0 mLs Intravenous Stopped 05/09/17 1921)  HYDROmorphone (DILAUDID) injection 1 mg (1 mg Intravenous Given 05/09/17 1825)  ondansetron (ZOFRAN) injection 4 mg (4 mg Intravenous Given 05/09/17 1825)     NEW OUTPATIENT MEDICATIONS STARTED DURING THIS VISIT:  Discharge Medication List as of 05/09/2017  9:15 PM    START taking these medications   Details  ondansetron (ZOFRAN ODT) 4 MG disintegrating tablet Take 1 tablet (4 mg total) by mouth every 8 (eight) hours as needed for nausea or vomiting., Starting Tue 05/09/2017, Print    polyethylene glycol (MIRALAX) packet Take 17 g by mouth daily., Starting Tue 05/09/2017, Print    traMADol (ULTRAM) 50 MG tablet Take 1 tablet (50 mg total) by mouth every 6 (six) hours as needed., Starting Tue 05/09/2017, Print        Note:  This document was prepared using Dragon voice recognition software and may include unintentional dictation errors.  Nanda Quinton, MD Emergency Medicine    Rowdy Guerrini, [facility], MD 05/10/17 [phone]

## 2017-05-09 NOTE — Discharge Instructions (Signed)

## 2017-05-09 NOTE — ED Triage Notes (Signed)
Left lower quadrant pain, nausea, vomiting, constipation

## 2017-05-09 NOTE — ED Notes (Signed)
Pt alert & oriented x4. Patient given discharge instructions, paperwork & prescription(s). Patient verbalized understanding. Pt left department in wheelchair escorted by staff. Pt left w/ no further questions. 

## 2017-05-10 ENCOUNTER — Encounter (HOSPITAL_COMMUNITY): Payer: Self-pay

## 2017-05-10 ENCOUNTER — Ambulatory Visit (HOSPITAL_COMMUNITY)
Admission: RE | Admit: 2017-05-10 | Discharge: 2017-05-10 | Disposition: A | Discharge status: Home / Self Care | Payer: Medicare Other | Source: Ambulatory Visit | Attending: Family Medicine | Admitting: Family Medicine

## 2017-05-17 ENCOUNTER — Ambulatory Visit (HOSPITAL_COMMUNITY): Payer: Medicare Other

## 2017-05-24 ENCOUNTER — Emergency Department (HOSPITAL_COMMUNITY): Payer: Medicare Other

## 2017-05-24 ENCOUNTER — Encounter (HOSPITAL_COMMUNITY): Payer: Self-pay | Admitting: Emergency Medicine

## 2017-05-24 ENCOUNTER — Inpatient Hospital Stay (HOSPITAL_COMMUNITY)
Admission: EM | Admit: 2017-05-24 | Discharge: 2017-05-26 | DRG: 300 | Disposition: A | Discharge status: Home / Self Care | Payer: Medicare Other | Attending: Family Medicine | Admitting: Family Medicine

## 2017-05-24 ENCOUNTER — Emergency Department (HOSPITAL_BASED_OUTPATIENT_CLINIC_OR_DEPARTMENT_OTHER)
Admit: 2017-05-24 | Discharge: 2017-05-24 | Disposition: A | Discharge status: Home / Self Care | Payer: Medicare Other | Attending: Emergency Medicine | Admitting: Emergency Medicine

## 2017-05-24 ENCOUNTER — Inpatient Hospital Stay (HOSPITAL_COMMUNITY): Payer: Medicare Other

## 2017-05-24 DIAGNOSIS — I70222 Atherosclerosis of native arteries of extremities with rest pain, left leg: Principal | ICD-10-CM | POA: Diagnosis present

## 2017-05-24 DIAGNOSIS — K449 Diaphragmatic hernia without obstruction or gangrene: Secondary | ICD-10-CM | POA: Diagnosis present

## 2017-05-24 DIAGNOSIS — Z79891 Long term (current) use of opiate analgesic: Secondary | ICD-10-CM

## 2017-05-24 DIAGNOSIS — I7092 Chronic total occlusion of artery of the extremities: Secondary | ICD-10-CM | POA: Diagnosis present

## 2017-05-24 DIAGNOSIS — Z89612 Acquired absence of left leg above knee: Secondary | ICD-10-CM | POA: Diagnosis not present

## 2017-05-24 DIAGNOSIS — M4856XA Collapsed vertebra, not elsewhere classified, lumbar region, initial encounter for fracture: Secondary | ICD-10-CM | POA: Diagnosis present

## 2017-05-24 DIAGNOSIS — Z881 Allergy status to other antibiotic agents status: Secondary | ICD-10-CM

## 2017-05-24 DIAGNOSIS — L89221 Pressure ulcer of left hip, stage 1: Secondary | ICD-10-CM | POA: Diagnosis present

## 2017-05-24 DIAGNOSIS — Z880 Allergy status to penicillin: Secondary | ICD-10-CM

## 2017-05-24 DIAGNOSIS — Z6827 Body mass index (BMI) 27.0-27.9, adult: Secondary | ICD-10-CM | POA: Diagnosis not present

## 2017-05-24 DIAGNOSIS — M25551 Pain in right hip: Secondary | ICD-10-CM | POA: Diagnosis not present

## 2017-05-24 DIAGNOSIS — R112 Nausea with vomiting, unspecified: Secondary | ICD-10-CM

## 2017-05-24 DIAGNOSIS — Z791 Long term (current) use of non-steroidal anti-inflammatories (NSAID): Secondary | ICD-10-CM

## 2017-05-24 DIAGNOSIS — Z9049 Acquired absence of other specified parts of digestive tract: Secondary | ICD-10-CM

## 2017-05-24 DIAGNOSIS — Z951 Presence of aortocoronary bypass graft: Secondary | ICD-10-CM

## 2017-05-24 DIAGNOSIS — L8990 Pressure ulcer of unspecified site, unspecified stage: Secondary | ICD-10-CM | POA: Insufficient documentation

## 2017-05-24 DIAGNOSIS — R011 Cardiac murmur, unspecified: Secondary | ICD-10-CM | POA: Diagnosis present

## 2017-05-24 DIAGNOSIS — F17210 Nicotine dependence, cigarettes, uncomplicated: Secondary | ICD-10-CM | POA: Diagnosis present

## 2017-05-24 DIAGNOSIS — J449 Chronic obstructive pulmonary disease, unspecified: Secondary | ICD-10-CM | POA: Diagnosis not present

## 2017-05-24 DIAGNOSIS — G8929 Other chronic pain: Secondary | ICD-10-CM | POA: Diagnosis present

## 2017-05-24 DIAGNOSIS — M48061 Spinal stenosis, lumbar region without neurogenic claudication: Secondary | ICD-10-CM | POA: Diagnosis not present

## 2017-05-24 DIAGNOSIS — F1010 Alcohol abuse, uncomplicated: Secondary | ICD-10-CM | POA: Diagnosis present

## 2017-05-24 DIAGNOSIS — Z888 Allergy status to other drugs, medicaments and biological substances status: Secondary | ICD-10-CM | POA: Diagnosis not present

## 2017-05-24 DIAGNOSIS — E871 Hypo-osmolality and hyponatremia: Secondary | ICD-10-CM | POA: Diagnosis present

## 2017-05-24 DIAGNOSIS — M5116 Intervertebral disc disorders with radiculopathy, lumbar region: Secondary | ICD-10-CM | POA: Diagnosis present

## 2017-05-24 DIAGNOSIS — I129 Hypertensive chronic kidney disease with stage 1 through stage 4 chronic kidney disease, or unspecified chronic kidney disease: Secondary | ICD-10-CM | POA: Diagnosis present

## 2017-05-24 DIAGNOSIS — M1990 Unspecified osteoarthritis, unspecified site: Secondary | ICD-10-CM | POA: Diagnosis present

## 2017-05-24 DIAGNOSIS — M25552 Pain in left hip: Secondary | ICD-10-CM | POA: Diagnosis not present

## 2017-05-24 DIAGNOSIS — I739 Peripheral vascular disease, unspecified: Secondary | ICD-10-CM | POA: Diagnosis not present

## 2017-05-24 DIAGNOSIS — I2510 Atherosclerotic heart disease of native coronary artery without angina pectoris: Secondary | ICD-10-CM

## 2017-05-24 DIAGNOSIS — E1122 Type 2 diabetes mellitus with diabetic chronic kidney disease: Secondary | ICD-10-CM | POA: Diagnosis present

## 2017-05-24 DIAGNOSIS — E1151 Type 2 diabetes mellitus with diabetic peripheral angiopathy without gangrene: Secondary | ICD-10-CM | POA: Diagnosis present

## 2017-05-24 DIAGNOSIS — E7800 Pure hypercholesterolemia, unspecified: Secondary | ICD-10-CM | POA: Diagnosis present

## 2017-05-24 DIAGNOSIS — I10 Essential (primary) hypertension: Secondary | ICD-10-CM | POA: Diagnosis not present

## 2017-05-24 DIAGNOSIS — Z79899 Other long term (current) drug therapy: Secondary | ICD-10-CM

## 2017-05-24 DIAGNOSIS — X58XXXA Exposure to other specified factors, initial encounter: Secondary | ICD-10-CM | POA: Diagnosis present

## 2017-05-24 DIAGNOSIS — M79605 Pain in left leg: Secondary | ICD-10-CM | POA: Diagnosis not present

## 2017-05-24 DIAGNOSIS — Z8249 Family history of ischemic heart disease and other diseases of the circulatory system: Secondary | ICD-10-CM

## 2017-05-24 DIAGNOSIS — R079 Chest pain, unspecified: Secondary | ICD-10-CM | POA: Diagnosis not present

## 2017-05-24 DIAGNOSIS — M109 Gout, unspecified: Secondary | ICD-10-CM | POA: Diagnosis present

## 2017-05-24 DIAGNOSIS — Z905 Acquired absence of kidney: Secondary | ICD-10-CM | POA: Diagnosis not present

## 2017-05-24 DIAGNOSIS — I25119 Atherosclerotic heart disease of native coronary artery with unspecified angina pectoris: Secondary | ICD-10-CM | POA: Diagnosis present

## 2017-05-24 DIAGNOSIS — Z8554 Personal history of malignant neoplasm of ureter: Secondary | ICD-10-CM

## 2017-05-24 DIAGNOSIS — I252 Old myocardial infarction: Secondary | ICD-10-CM

## 2017-05-24 DIAGNOSIS — Z794 Long term (current) use of insulin: Secondary | ICD-10-CM

## 2017-05-24 DIAGNOSIS — E46 Unspecified protein-calorie malnutrition: Secondary | ICD-10-CM | POA: Diagnosis not present

## 2017-05-24 DIAGNOSIS — M79609 Pain in unspecified limb: Secondary | ICD-10-CM

## 2017-05-24 DIAGNOSIS — R52 Pain, unspecified: Secondary | ICD-10-CM | POA: Diagnosis not present

## 2017-05-24 DIAGNOSIS — L89321 Pressure ulcer of left buttock, stage 1: Secondary | ICD-10-CM | POA: Diagnosis not present

## 2017-05-24 DIAGNOSIS — N183 Chronic kidney disease, stage 3 unspecified: Secondary | ICD-10-CM | POA: Diagnosis present

## 2017-05-24 DIAGNOSIS — M546 Pain in thoracic spine: Secondary | ICD-10-CM | POA: Diagnosis not present

## 2017-05-24 DIAGNOSIS — R9431 Abnormal electrocardiogram [ECG] [EKG]: Secondary | ICD-10-CM | POA: Diagnosis not present

## 2017-05-24 DIAGNOSIS — Z7982 Long term (current) use of aspirin: Secondary | ICD-10-CM

## 2017-05-24 LAB — CBC WITH DIFFERENTIAL/PLATELET
BASOS ABS: 0 10*3/uL (ref 0.0–0.1)
BASOS PCT: 0 %
EOS PCT: 1 %
Eosinophils Absolute: 0.1 10*3/uL (ref 0.0–0.7)
HEMATOCRIT: 45.7 % (ref 39.0–52.0)
Hemoglobin: 15.8 g/dL (ref 13.0–17.0)
LYMPHS PCT: 20 %
Lymphs Abs: 1.5 10*3/uL (ref 0.7–4.0)
MCH: 30.9 pg (ref 26.0–34.0)
MCHC: 34.6 g/dL (ref 30.0–36.0)
MCV: 89.3 fL (ref 78.0–100.0)
Monocytes Absolute: 0.5 10*3/uL (ref 0.1–1.0)
Monocytes Relative: 7 %
NEUTROS ABS: 5.2 10*3/uL (ref 1.7–7.7)
Neutrophils Relative %: 72 %
PLATELETS: 223 10*3/uL (ref 150–400)
RBC: 5.12 MIL/uL (ref 4.22–5.81)
RDW: 13.8 % (ref 11.5–15.5)
WBC: 7.3 10*3/uL (ref 4.0–10.5)

## 2017-05-24 LAB — URINALYSIS, ROUTINE W REFLEX MICROSCOPIC
Bilirubin Urine: NEGATIVE
GLUCOSE, UA: NEGATIVE mg/dL
HGB URINE DIPSTICK: NEGATIVE
Ketones, ur: 20 mg/dL — AB
Leukocytes, UA: NEGATIVE
Nitrite: NEGATIVE
PH: 6 (ref 5.0–8.0)
PROTEIN: 30 mg/dL — AB
Specific Gravity, Urine: 1.019 (ref 1.005–1.030)

## 2017-05-24 LAB — I-STAT TROPONIN, ED: TROPONIN I, POC: 0.05 ng/mL (ref 0.00–0.08)

## 2017-05-24 LAB — COMPREHENSIVE METABOLIC PANEL
ALT: 13 U/L — ABNORMAL LOW (ref 17–63)
ANION GAP: 11 (ref 5–15)
AST: 14 U/L — ABNORMAL LOW (ref 15–41)
Albumin: 3.8 g/dL (ref 3.5–5.0)
Alkaline Phosphatase: 51 U/L (ref 38–126)
BILIRUBIN TOTAL: 1.1 mg/dL (ref 0.3–1.2)
BUN: 29 mg/dL — ABNORMAL HIGH (ref 6–20)
CO2: 19 mmol/L — ABNORMAL LOW (ref 22–32)
Calcium: 9.4 mg/dL (ref 8.9–10.3)
Chloride: 101 mmol/L (ref 101–111)
Creatinine, Ser: 1.45 mg/dL — ABNORMAL HIGH (ref 0.61–1.24)
GFR calc Af Amer: 53 mL/min — ABNORMAL LOW (ref >60)
GFR, EST NON AFRICAN AMERICAN: 46 mL/min — AB (ref >60)
Glucose, Bld: 103 mg/dL — ABNORMAL HIGH (ref 65–99)
POTASSIUM: 5.1 mmol/L (ref 3.5–5.1)
Sodium: 131 mmol/L — ABNORMAL LOW (ref 135–145)
TOTAL PROTEIN: 6.9 g/dL (ref 6.5–8.1)

## 2017-05-24 LAB — GLUCOSE, CAPILLARY: Glucose-Capillary: 95 mg/dL (ref 65–99)

## 2017-05-24 LAB — TROPONIN I: TROPONIN I: 0.04 ng/mL — AB (ref <0.03)

## 2017-05-24 LAB — HEMOGLOBIN A1C
HEMOGLOBIN A1C: 6.8 % — AB (ref 4.8–5.6)
Mean Plasma Glucose: 148.46 mg/dL

## 2017-05-24 LAB — D-DIMER, QUANTITATIVE: D-Dimer, Quant: 0.6 ug/mL-FEU — ABNORMAL HIGH (ref 0.00–0.50)

## 2017-05-24 MED ORDER — FENTANYL 25 MCG/HR TD PT72
25.0000 ug | MEDICATED_PATCH | TRANSDERMAL | Status: DC
  Administered 2017-05-24: 25 ug via TRANSDERMAL
  Filled 2017-05-24: qty 1

## 2017-05-24 MED ORDER — INSULIN ASPART 100 UNIT/ML ~~LOC~~ SOLN: 0.0000 [IU] | Freq: Three times a day (TID) | SUBCUTANEOUS | Status: DC

## 2017-05-24 MED ORDER — TRIAZOLAM 0.25 MG PO TABS
0.2500 mg | ORAL_TABLET | Freq: Every evening | ORAL | Status: DC | PRN
  Administered 2017-05-24 – 2017-05-25 (×2): 0.25 mg via ORAL
  Filled 2017-05-24 (×2): qty 1

## 2017-05-24 MED ORDER — MOMETASONE FURO-FORMOTEROL FUM 200-5 MCG/ACT IN AERO
2.0000 | INHALATION_SPRAY | Freq: Two times a day (BID) | RESPIRATORY_TRACT | Status: DC
  Administered 2017-05-24 – 2017-05-26 (×4): 2 via RESPIRATORY_TRACT
  Filled 2017-05-24: qty 8.8

## 2017-05-24 MED ORDER — GABAPENTIN 300 MG PO CAPS
900.0000 mg | ORAL_CAPSULE | Freq: Every day | ORAL | Status: DC
  Administered 2017-05-24 – 2017-05-25 (×2): 900 mg via ORAL
  Filled 2017-05-24 (×2): qty 3

## 2017-05-24 MED ORDER — OXYCODONE HCL 5 MG PO TABS
20.0000 mg | ORAL_TABLET | ORAL | Status: DC | PRN
  Administered 2017-05-24 – 2017-05-26 (×8): 20 mg via ORAL
  Filled 2017-05-24 (×8): qty 4

## 2017-05-24 MED ORDER — ENOXAPARIN SODIUM 30 MG/0.3ML ~~LOC~~ SOLN
30.0000 mg | SUBCUTANEOUS | Status: DC
  Administered 2017-05-24: 30 mg via SUBCUTANEOUS
  Filled 2017-05-24: qty 0.3

## 2017-05-24 MED ORDER — SENNOSIDES-DOCUSATE SODIUM 8.6-50 MG PO TABS
1.0000 | ORAL_TABLET | Freq: Every evening | ORAL | Status: DC | PRN
  Administered 2017-05-25: 1 via ORAL
  Filled 2017-05-24: qty 1

## 2017-05-24 MED ORDER — ALBUTEROL SULFATE (2.5 MG/3ML) 0.083% IN NEBU
2.5000 mg | INHALATION_SOLUTION | Freq: Four times a day (QID) | RESPIRATORY_TRACT | Status: DC | PRN
Start: 2017-05-24 — End: 2017-05-26

## 2017-05-24 MED ORDER — VITAMIN B-1 100 MG PO TABS
100.0000 mg | ORAL_TABLET | Freq: Every day | ORAL | Status: DC
  Administered 2017-05-24 – 2017-05-26 (×3): 100 mg via ORAL
  Filled 2017-05-24 (×3): qty 1

## 2017-05-24 MED ORDER — PROMETHAZINE HCL 25 MG/ML IJ SOLN
12.5000 mg | Freq: Once | INTRAMUSCULAR | Status: AC
  Administered 2017-05-24: 12.5 mg via INTRAVENOUS
  Filled 2017-05-24: qty 1

## 2017-05-24 MED ORDER — ONDANSETRON HCL 4 MG PO TABS: 4.0000 mg | ORAL_TABLET | Freq: Four times a day (QID) | ORAL | Status: DC | PRN

## 2017-05-24 MED ORDER — ZOLPIDEM TARTRATE 10 MG PO TABS
5.0000 mg | ORAL_TABLET | Freq: Every evening | ORAL | Status: DC | PRN
  Administered 2017-05-24: 5 mg via ORAL
  Filled 2017-05-24 (×2): qty 1

## 2017-05-24 MED ORDER — HYDROMORPHONE HCL-NACL 0.5-0.9 MG/ML-% IV SOSY
2.0000 mg | PREFILLED_SYRINGE | INTRAVENOUS | Status: DC | PRN
  Administered 2017-05-24 – 2017-05-26 (×6): 2 mg via INTRAVENOUS
  Filled 2017-05-24 (×6): qty 4

## 2017-05-24 MED ORDER — SODIUM CHLORIDE 0.9 % IV BOLUS (SEPSIS)
500.0000 mL | Freq: Once | INTRAVENOUS | Status: AC
  Administered 2017-05-24: 500 mL via INTRAVENOUS

## 2017-05-24 MED ORDER — HYDROMORPHONE HCL 1 MG/ML IJ SOLN
1.0000 mg | Freq: Once | INTRAMUSCULAR | Status: AC
  Administered 2017-05-24: 1 mg via INTRAVENOUS
  Filled 2017-05-24: qty 1

## 2017-05-24 MED ORDER — ONDANSETRON HCL 4 MG/2ML IJ SOLN
4.0000 mg | Freq: Four times a day (QID) | INTRAMUSCULAR | Status: DC | PRN
  Administered 2017-05-24 – 2017-05-25 (×2): 4 mg via INTRAVENOUS
  Filled 2017-05-24 (×2): qty 2

## 2017-05-24 MED ORDER — MORPHINE SULFATE (PF) 4 MG/ML IV SOLN
4.0000 mg | Freq: Once | INTRAVENOUS | Status: DC
  Filled 2017-05-24: qty 1

## 2017-05-24 MED ORDER — GABAPENTIN 300 MG PO CAPS: 600.0000 mg | ORAL_CAPSULE | Freq: Two times a day (BID) | ORAL | Status: DC

## 2017-05-24 MED ORDER — ASPIRIN EC 81 MG PO TBEC
81.0000 mg | DELAYED_RELEASE_TABLET | Freq: Every day | ORAL | Status: DC
  Administered 2017-05-24 – 2017-05-25 (×2): 81 mg via ORAL
  Filled 2017-05-24 (×2): qty 1

## 2017-05-24 MED ORDER — TRAMADOL HCL 50 MG PO TABS
50.0000 mg | ORAL_TABLET | Freq: Four times a day (QID) | ORAL | Status: DC | PRN
  Administered 2017-05-24 – 2017-05-25 (×2): 50 mg via ORAL
  Filled 2017-05-24 (×2): qty 1

## 2017-05-24 MED ORDER — PROMETHAZINE HCL 25 MG/ML IJ SOLN
12.5000 mg | Freq: Four times a day (QID) | INTRAMUSCULAR | Status: DC | PRN
  Administered 2017-05-25: 12.5 mg via INTRAVENOUS
  Filled 2017-05-24: qty 1

## 2017-05-24 MED ORDER — SODIUM CHLORIDE 0.9 % IV BOLUS (SEPSIS): 1000.0000 mL | Freq: Once | INTRAVENOUS | Status: DC

## 2017-05-24 MED ORDER — HYDROMORPHONE HCL 1 MG/ML IJ SOLN
2.0000 mg | INTRAMUSCULAR | Status: DC | PRN
Start: 2017-05-24 — End: 2017-05-24
  Administered 2017-05-24: 2 mg via INTRAVENOUS
  Filled 2017-05-24: qty 2

## 2017-05-24 MED ORDER — NITROGLYCERIN 0.4 MG SL SUBL: 0.4000 mg | SUBLINGUAL_TABLET | SUBLINGUAL | Status: DC | PRN

## 2017-05-24 MED ORDER — GABAPENTIN 300 MG PO CAPS
600.0000 mg | ORAL_CAPSULE | Freq: Every day | ORAL | Status: DC
  Administered 2017-05-25 – 2017-05-26 (×2): 600 mg via ORAL
  Filled 2017-05-24 (×2): qty 2

## 2017-05-24 MED ORDER — ALBUTEROL SULFATE (2.5 MG/3ML) 0.083% IN NEBU: 3.0000 mL | INHALATION_SOLUTION | RESPIRATORY_TRACT | Status: DC | PRN

## 2017-05-24 NOTE — ED Notes (Signed)
Shawn, PA at bedside. 

## 2017-05-24 NOTE — ED Provider Notes (Signed)
[facility] DEPT Provider Note   CSN: [id number] Arrival date & time: 05/24/17  1135     History   Chief Complaint Chief Complaint  Patient presents with  . Hip Pain  . Emesis  . Nausea    HPI [name] is a 76 y.o. male.  HPI   [name] is a 77 y.o. male, with a history of Asthma, COPD, MI, CABG, left nephrectomy, and DM, presenting to the ED with Left leg pain worsening over the last 2-3 weeks. Patient had a left AKA 2011. Pain is waxing and waning, shooting, radiates from the left leg into the left hip, left flank, and lower back, 10/10. States he has not experienced this pain before. Pain is worse when his left leg stump is elevated. Nausea and vomiting beginning 2 weeks ago.   Began to have chest pain last night. He has had an episode last night and one this morning. Pain is "like pushing into my chest," central chest, nonradiating, rated 6/10. Pain resolved with one nitroglycerin this morning. This episode was accompanied by diaphoresis.  Denies fever, diarrhea, cough, shortness of breath, or any other complaints.      Past Medical History:  Diagnosis Date  . Anginal pain ([place])   . Arthritis    "qwhere" (07/09/2013)  . Asthma   . COPD (chronic obstructive pulmonary disease) ([place])    pt denies this hx on 07/09/2013  . Coronary artery disease   . Gout   . H/O hiatal hernia 1997  . Heart murmur    "had rheumatic fever as a kid" (07/09/2013)  . High cholesterol   . Hypertension   . Myocardial infarction ([place]) 03/2002; ~ 2011  . Pneumonia    "once" (07/09/2013)  . Renal disorder    "only have 1 kidney; due to go back soon to check the other one" (07/09/2013)  . Rheumatic fever   . RUPTURE ROTATOR CUFF 12/03/2009   Qualifier: Diagnosis of  By: Aline Brochure MD, Dorothyann Peng    . Shortness of breath    "can happen at anytime" (07/09/2013)  . Type II diabetes mellitus ([place])   . Ureter cancer [facility]) 12/2001   "shut down my kidney & resulted in nephrectomy"  (07/09/2013)    Patient Active Problem List   Diagnosis Date Noted  . CKD (chronic kidney disease), stage III 06/29/2016  . COPD exacerbation ([place]) 06/16/2016  . Hyperkalemia 11/24/2015  . UTI (lower urinary tract infection) 11/24/2015  . Acute UTI 09/11/2014  . Acute respiratory failure with hypercapnia ([place]) 09/10/2014  . Acute encephalopathy 09/10/2014  . Alcoholic dementia ([place]) 70/62/3762  . Recurrent falls 09/10/2014  . Left shoulder pain 09/10/2014  . CAD in native artery 09/10/2014  . Diabetes mellitus with peripheral vascular disease ([place]) 09/10/2014  . FTT (failure to thrive) in adult 09/10/2014  . Protein-calorie malnutrition ([place]) 09/10/2014  . Mixed acid base balance disorder 09/09/2014  . Acute kidney injury ([place]) 09/09/2014  . Hypotension 09/09/2014  . Fall 08/27/2014  . Altered mental status 08/26/2014  . Dehydration 08/26/2014  . Alcohol abuse 08/26/2014  . Hypertension 08/26/2014  . Aftercare following surgery of the circulatory system, [place] 08/05/2013  . Leg pain 07/01/2013  . Peripheral vascular disease ([place]) 05/27/2013  . Peripheral angiopathy in diseases classified elsewhere ([place]) 05/02/2012  . Diabetes ([place]) 05/02/2012  . Pain in limb 05/02/2012  . Chronic total occlusion of artery of the extremities ([place]) 05/02/2012  . JOINT EFFUSION, KNEE 12/03/2009  . RUPTURE ROTATOR CUFF  12/03/2009  . ISCHEMIC CARDIOMYOPATHY 08/24/2009  . CHF 08/24/2009  . BACK PAIN, LUMBAR, CHRONIC 08/24/2009    Past Surgical History:  Procedure Laterality Date  . ABOVE KNEE LEG AMPUTATION Left 09/2009  . BELOW KNEE LEG AMPUTATION Left 08/2009  . CARDIAC CATHETERIZATION  ~ 2010   "before the bypass" (07/09/2013)  . CARPAL TUNNEL RELEASE Bilateral ~2004; ~ 2011  . CHOLECYSTECTOMY  1980's  . CORONARY ARTERY BYPASS GRAFT  ~ 2010   "CABG X5" (07/09/2013)  . EYE SURGERY    . FEMORAL ARTERY STENT Right 07/09/2013  . GLAUCOMA SURGERY Bilateral ?[id number]  . LOWER EXTREMITY  ANGIOGRAM Right Nov. 4, 2014  . LOWER EXTREMITY ANGIOGRAM Right 07/09/2013   Procedure: LOWER EXTREMITY ANGIOGRAM;  Surgeon: Serafina Mitchell, MD;  Location[facility] CATH LAB;  Service: Cardiovascular;  Laterality: Right;  . NEPHRECTOMY Left 12/19/2001  . PERIPHERAL VASCULAR CATHETERIZATION N/A 02/03/2015   Procedure: Abdominal Aortogram;  Surgeon: Serafina Mitchell, MD;  Location: [facility] CV LAB;  Service: Cardiovascular;  Laterality: N/A;  . TRIGGER FINGER RELEASE Left ~ 1990's       Home Medications    Prior to Admission medications   Medication Sig Start Date End Date Taking? Authorizing Provider  albuterol (PROVENTIL HFA;VENTOLIN HFA) 108 (90 Base) MCG/ACT inhaler Inhale 2 puffs into the lungs every 4 (four) hours as needed for wheezing or shortness of breath.   Yes [provider]  cholecalciferol (VITAMIN D) 400 units TABS tablet Take 1,200 Units by mouth 2 (two) times daily.   Yes [provider]  gabapentin (NEURONTIN) 300 MG capsule Take 1 capsule (300 mg total) by mouth 2 (two) times daily. Pt takes two capsules in the morning and three at bedtime. Patient taking differently: Take 600-900 mg by mouth 2 (two) times daily. Pt takes two capsules in the morning and three at bedtime.  07/02/16  Yes Verlee Monte, MD  Insulin Glargine (TOUJEO SOLOSTAR) 300 UNIT/ML SOPN Inject 5 Units into the skin daily as needed (for BS greater than 200).    Yes [provider]  metFORMIN (GLUCOPHAGE) 500 MG tablet Take 500 mg by mouth 2 (two) times daily with a meal.   Yes [provider]  mometasone-formoterol (DULERA) 200-5 MCG/ACT AERO Inhale 2 puffs into the lungs daily as needed for wheezing or shortness of breath.    Yes [provider]  nitroGLYCERIN (NITROSTAT) 0.4 MG SL tablet Place 0.4 mg under the tongue every 5 (five) minutes as needed for chest pain.   Yes [provider]  ondansetron (ZOFRAN ODT) 4 MG disintegrating tablet Take 1 tablet (4 mg  total) by mouth every 8 (eight) hours as needed for nausea or vomiting. 05/09/17  Yes Long, Wonda Olds, MD  Oxycodone HCl 20 MG TABS Take 1 tablet by mouth every 4 (four) hours as needed (pain).   Yes [provider]  ramipril (ALTACE) 10 MG capsule Take 10 mg by mouth daily.   Yes [provider]  Tamsulosin HCl (FLOMAX) 0.4 MG CAPS Take 0.4 mg by mouth 2 (two) times daily.    Yes [provider]  zolpidem (AMBIEN) 10 MG tablet Take 10 mg by mouth at bedtime as needed for sleep.   Yes [provider]  albuterol (PROVENTIL) (2.5 MG/3ML) 0.083% nebulizer solution Take 2.5 mg by nebulization every 6 (six) hours as needed for wheezing or shortness of breath.    [provider]  aspirin EC 81 MG tablet Take 81 mg by  mouth at bedtime.    [provider]  naproxen (NAPROSYN) 500 MG tablet Take 500 mg by mouth 2 (two) times daily with a meal.    [provider]  OXYGEN Inhale 6 L into the lungs daily as needed (for breathing).    [provider]  polyethylene glycol (MIRALAX) packet Take 17 g by mouth daily. Patient not taking: Reported on 05/24/2017 05/09/17   Long, Wonda Olds, MD  senna-docusate (SENOKOT-S) 8.6-50 MG tablet Take 1 tablet by mouth at bedtime as needed for mild constipation.    [provider]  traMADol (ULTRAM) 50 MG tablet Take 1 tablet (50 mg total) by mouth every 6 (six) hours as needed. Patient taking differently: Take 50 mg by mouth every 6 (six) hours as needed for moderate pain.  05/09/17   Long, Wonda Olds, MD  triazolam (HALCION) 0.25 MG tablet Take 0.25 mg by mouth at bedtime as needed for sleep.    [provider]    Family History Family History  Problem Relation Age of Onset  . Cancer Mother   . Heart attack Father     Social History Social History  Substance Use Topics  . Smoking status: Current Every Day Smoker    Packs/day: 1.00    Years: 54.00    Types: Cigarettes  . Smokeless  tobacco: Never Used  . Alcohol use 0.0 oz/week     Comment: social drinker     Allergies   Erythromycin; Iohexol; and Penicillins   Review of Systems Review of Systems  Constitutional: Positive for appetite change and diaphoresis. Negative for chills and fever.  Respiratory: Negative for shortness of breath.   Cardiovascular: Positive for chest pain (resolved).  Gastrointestinal: Positive for nausea and vomiting.  Genitourinary: Negative for difficulty urinating and dysuria.  Musculoskeletal: Positive for arthralgias and myalgias.  Neurological: Negative for weakness, numbness and headaches.  All other systems reviewed and are negative.    Physical Exam Updated Vital Signs BP (!) 148/103 (BP Location: Right Arm)   Pulse (!) 101   Temp 98.5 F (36.9 C) (Oral)   Resp 20   SpO2 95%   Physical Exam  Constitutional: He appears well-developed and well-nourished. No distress.  HENT:  Head: Normocephalic and atraumatic.  Eyes: Conjunctivae are normal.  Neck: Neck supple.  Cardiovascular: Normal rate, regular rhythm, normal heart sounds and intact distal pulses.   Pulmonary/Chest: Effort normal and breath sounds normal. No respiratory distress.  Abdominal: Soft. There is no tenderness. There is no guarding.  Patient has a significantly large, easily reducible hernia to the left flank. Patient states this has been present since his left nephrectomy in 2003.  Musculoskeletal: He exhibits tenderness. He exhibits no edema.  Tenderness in the left lateral leg stump. No noted swelling, erythema, or increased warmth. ROM intact, but painful in the left hip. Left and right legs are cool to the touch, but feel to be similar temperature. Hypopigmentation on the left.  Lymphadenopathy:    He has no cervical adenopathy.  Neurological: He is alert.  Sensation appears intact in the bilateral lower extremities. 5/5 strength with flexion and extension of the bilateral hips.  Skin: Skin is  warm and dry. Capillary refill takes less than 2 seconds. He is not diaphoretic. No erythema.  Psychiatric: He has a normal mood and affect. His behavior is normal.  Nursing note and vitals reviewed.    ED Treatments / Results  Labs (all labs ordered are listed, but only abnormal results  are displayed) Labs Reviewed  COMPREHENSIVE METABOLIC PANEL - Abnormal; Notable for the following:       Result Value   Sodium 131 (*)    CO2 19 (*)    Glucose, Bld 103 (*)    BUN 29 (*)    Creatinine, Ser 1.45 (*)    AST 14 (*)    ALT 13 (*)    GFR calc non Af Amer 46 (*)    GFR calc Af Amer 53 (*)    All other components within normal limits  D-DIMER, QUANTITATIVE (NOT AT [facility]) - Abnormal; Notable for the following:    D-Dimer, Quant 0.60 (*)    All other components within normal limits  URINALYSIS, ROUTINE W REFLEX MICROSCOPIC - Abnormal; Notable for the following:    Ketones, ur 20 (*)    Protein, ur 30 (*)    Bacteria, UA RARE (*)    Squamous Epithelial / LPF 0-5 (*)    All other components within normal limits  CBC WITH DIFFERENTIAL/PLATELET  I-STAT TROPONIN, ED   BUN  Date Value Ref Range Status  05/24/2017 29 (H) 6 - 20 mg/dL Final  05/09/2017 20 6 - 20 mg/dL Final  07/02/2016 50 (H) 6 - 20 mg/dL Final  07/01/2016 40 (H) 6 - 20 mg/dL Final   Creatinine, Ser  Date Value Ref Range Status  05/24/2017 1.45 (H) 0.61 - 1.24 mg/dL Final  05/09/2017 1.46 (H) 0.61 - 1.24 mg/dL Final  07/02/2016 1.72 (H) 0.61 - 1.24 mg/dL Final  07/01/2016 1.63 (H) 0.61 - 1.24 mg/dL Final     EKG  EKG Interpretation None       Radiology No results found.   Bilateral lower extremity venous duplex completed.    Preliminary report:  Right:  No evidence of DVT, superficial thrombosis, or Baker's cyst. Left:  No evidence of DVTor superficial thrombosis noted in the thigh or Baker's cyst. Patient is a left AKA  SLAUGHTER, [place], RVS 05/24/2017, 3:07 PM  Procedures Procedures (including  critical care time)  Medications Ordered in ED Medications  sodium chloride 0.9 % bolus 500 mL (500 mLs Intravenous New Bag/Given 05/24/17 1614)  HYDROmorphone (DILAUDID) injection 2 mg (not administered)  HYDROmorphone (DILAUDID) injection 1 mg (1 mg Intravenous Given 05/24/17 1342)  promethazine (PHENERGAN) injection 12.5 mg (12.5 mg Intravenous Given 05/24/17 1418)     Initial Impression / Assessment and Plan / ED Course  I have reviewed the triage vital signs and the nursing notes.  Pertinent labs & imaging results that were available during my care of the patient were reviewed by me and considered in my medical decision making (see chart for details).  Clinical Course as of May 24 1638  Wed May 24, 2017  1526 Can be age-adjusted to be normal. Duplex US of LE free from DVT. D-Dimer, Quant: (!) 0.60 [SJ]  [id number] Spoke with Dr. Broadus John, hospitalist. Agrees to admit the patient.  [SJ]    Clinical Course User Index [SJ] Mally Gavina C, PA-C   Patient presents with left leg pain worsening over the last 2 weeks. Accompanied by nausea and vomiting. Patient also complains of 2 episodes of chest pain over the past 24 hours. Most recent episode relieved with nitroglycerin. Chest pain episodes are concerning for a patient with such significant cardiac history. Duplex ultrasound negative for DVT bilaterally. Gentle hydration started due to patient's ketonuria in the setting of vomiting and without hyperglycemia. No anion gap. Patient admitted for chest pain. Imaging for extremity  and chest pending.  Findings and plan of care discussed with Julianne Rice, MD.   Vitals:   05/24/17 1147 05/24/17 1402 05/24/17 1547 05/24/17 1615  BP: (!) 148/103 (!) 136/102 (!) 150/96 (!) 166/104  Pulse: (!) 101  (!) 102 96  Resp: 20 18 17 20   Temp: 98.5 F (36.9 C)   98.5 F (36.9 C)  TempSrc: Oral   Oral  SpO2: 95%  96% 96%     Final Clinical Impressions(s) / ED Diagnoses   Final diagnoses:  Left hip  pain  Non-intractable vomiting with nausea, unspecified vomiting type    New Prescriptions New Prescriptions   No medications on file     Layla Maw 05/24/17 [place], Helane Gunther, PA-C 05/24/17 1654    Julianne Rice, MD 05/28/17 [phone]

## 2017-05-24 NOTE — Consult Note (Signed)
I will be by on Thursday to evaluate the patient.  Likely nothing to be done from a vascular perspective  Annamarie Major

## 2017-05-24 NOTE — H&P (Addendum)
History and Physical    [name] KNL:[id number] DOB: 14-Mar-1941 DOA: 05/24/2017  Referring MD/NP/PA: EDP PCP:  Patient coming from: Home  Chief Complaint: Severe L Stump pain  HPI: [name] is a 76 y.o. male with medical history significant for CAD status post CABG 6-7 years ago, diabetes on insulin, COPD, heavy smoker, left nephrectomy, severe PAD status post left AKA in 2011. Presents to the emergency room with severe pain in his left AKA stump radiating deep into his thigh/hip describes the pain as severe greater than 10 out of 10 constant,, He saw his primary doctor couple of weeks back and started on oxycodone 15 and 20 mg tablets with very limited relief, narcotics have been causing him to have nausea and vomiting as well. He denies any fevers or chills, denies swelling redness and drainage or trauma. In addition has been having intermittent chest pain for years and has not seen a cardiologist since his CABG 7 years ago, uses sublingual nitroglycerin when necessary which relieves his angina.  Currently that his diabetes is poorly controlled and currently smokes 1.5 packs per day ,  ED Course: Labs notable for mild hyponatremia, creatinine of 1.45, normal WBC, Dopplers negative for DVT, some reason he had a CT abdomen and pelvis done in the emergency room which was unremarkable   Review of Systems:  as per history of present illness otherwise 14 point review of system was negative Past Medical History:  Diagnosis Date  . Anginal pain ([place])   . Arthritis    "qwhere" (07/09/2013)  . Asthma   . COPD (chronic obstructive pulmonary disease) ([place])    pt denies this hx on 07/09/2013  . Coronary artery disease   . Gout   . H/O hiatal hernia 1997  . Heart murmur    "had rheumatic fever as a kid" (07/09/2013)  . High cholesterol   . Hypertension   . Myocardial infarction ([place]) 03/2002; ~ 2011  . Pneumonia    "once" (07/09/2013)  . Renal disorder    "only have 1 kidney; due  to go back soon to check the other one" (07/09/2013)  . Rheumatic fever   . RUPTURE ROTATOR CUFF 12/03/2009   Qualifier: Diagnosis of  By: Aline Brochure MD, Dorothyann Peng    . Shortness of breath    "can happen at anytime" (07/09/2013)  . Type II diabetes mellitus ([place])   . Ureter cancer [facility]) 12/2001   "shut down my kidney & resulted in nephrectomy" (07/09/2013)    Past Surgical History:  Procedure Laterality Date  . ABOVE KNEE LEG AMPUTATION Left 09/2009  . BELOW KNEE LEG AMPUTATION Left 08/2009  . CARDIAC CATHETERIZATION  ~ 2010   "before the bypass" (07/09/2013)  . CARPAL TUNNEL RELEASE Bilateral ~2004; ~ 2011  . CHOLECYSTECTOMY  1980's  . CORONARY ARTERY BYPASS GRAFT  ~ 2010   "CABG X5" (07/09/2013)  . EYE SURGERY    . FEMORAL ARTERY STENT Right 07/09/2013  . GLAUCOMA SURGERY Bilateral ?[id number]  . LOWER EXTREMITY ANGIOGRAM Right Nov. 4, 2014  . LOWER EXTREMITY ANGIOGRAM Right 07/09/2013   Procedure: LOWER EXTREMITY ANGIOGRAM;  Surgeon: Serafina Mitchell, MD;  Location: [facility] CATH LAB;  Service: Cardiovascular;  Laterality: Right;  . NEPHRECTOMY Left 12/19/2001  . PERIPHERAL VASCULAR CATHETERIZATION N/A 02/03/2015   Procedure: Abdominal Aortogram;  Surgeon: Serafina Mitchell, MD;  Location: [place] CV LAB;  Service: Cardiovascular;  Laterality: N/A;  . TRIGGER FINGER RELEASE Left ~ 1990's     reports  that he has been smoking Cigarettes.  He has a 54.00 pack-year smoking history. He has never used smokeless tobacco. He reports that he drinks alcohol. He reports that he does not use drugs.  Allergies  Allergen Reactions  . Erythromycin Anaphylaxis and Rash  . Iohexol Anaphylaxis, Hives, Itching and Other (See Comments)    Note:  13 HR PRE-MED GIVEN AND SUCCESSFUL-ARS 07/15/07.     Marland Kitchen Penicillins Anaphylaxis, Itching and Other (See Comments)    Has patient had a PCN reaction causing immediate rash, facial/tongue/throat swelling, SOB or lightheadedness with hypotension: Yes Has patient had a PCN  reaction causing severe rash involving mucus membranes or skin necrosis: No Has patient had a PCN reaction that required hospitalization No Has patient had a PCN reaction occurring within the last 10 years: No If all of the above answers are "NO", then may proceed with Cephalosporin use.    Family History  Problem Relation Age of Onset  . Cancer Mother   . Heart attack Father      Prior to Admission medications   Medication Sig Start Date End Date Taking? Authorizing Provider  albuterol (PROVENTIL HFA;VENTOLIN HFA) 108 (90 Base) MCG/ACT inhaler Inhale 2 puffs into the lungs every 4 (four) hours as needed for wheezing or shortness of breath.   Yes [provider]  cholecalciferol (VITAMIN D) 400 units TABS tablet Take 1,200 Units by mouth 2 (two) times daily.   Yes [provider]  gabapentin (NEURONTIN) 300 MG capsule Take 1 capsule (300 mg total) by mouth 2 (two) times daily. Pt takes two capsules in the morning and three at bedtime. Patient taking differently: Take 600-900 mg by mouth 2 (two) times daily. Pt takes two capsules in the morning and three at bedtime.  07/02/16  Yes Verlee Monte, MD  Insulin Glargine (TOUJEO SOLOSTAR) 300 UNIT/ML SOPN Inject 5 Units into the skin daily as needed (for BS greater than 200).    Yes [provider]  metFORMIN (GLUCOPHAGE) 500 MG tablet Take 500 mg by mouth 2 (two) times daily with a meal.   Yes [provider]  mometasone-formoterol (DULERA) 200-5 MCG/ACT AERO Inhale 2 puffs into the lungs daily as needed for wheezing or shortness of breath.    Yes [provider]  nitroGLYCERIN (NITROSTAT) 0.4 MG SL tablet Place 0.4 mg under the tongue every 5 (five) minutes as needed for chest pain.   Yes [provider]  ondansetron (ZOFRAN ODT) 4 MG disintegrating tablet Take 1 tablet (4 mg total) by mouth every 8 (eight) hours as needed for nausea or vomiting. 05/09/17  Yes Long, Wonda Olds, MD  Oxycodone HCl  20 MG TABS Take 1 tablet by mouth every 4 (four) hours as needed (pain).   Yes [provider]  ramipril (ALTACE) 10 MG capsule Take 10 mg by mouth daily.   Yes [provider]  Tamsulosin HCl (FLOMAX) 0.4 MG CAPS Take 0.4 mg by mouth 2 (two) times daily.    Yes [provider]  zolpidem (AMBIEN) 10 MG tablet Take 10 mg by mouth at bedtime as needed for sleep.   Yes [provider]  albuterol (PROVENTIL) (2.5 MG/3ML) 0.083% nebulizer solution Take 2.5 mg by nebulization every 6 (six) hours as needed for wheezing or shortness of breath.    [provider]  aspirin EC 81 MG tablet Take 81 mg by mouth at bedtime.    [provider]  naproxen (NAPROSYN) 500 MG tablet Take 500 mg  by mouth 2 (two) times daily with a meal.    [provider]  OXYGEN Inhale 6 L into the lungs daily as needed (for breathing).    [provider]  polyethylene glycol (MIRALAX) packet Take 17 g by mouth daily. Patient not taking: Reported on 05/24/2017 05/09/17   Long, Wonda Olds, MD  senna-docusate (SENOKOT-S) 8.6-50 MG tablet Take 1 tablet by mouth at bedtime as needed for mild constipation.    [provider]  traMADol (ULTRAM) 50 MG tablet Take 1 tablet (50 mg total) by mouth every 6 (six) hours as needed. Patient taking differently: Take 50 mg by mouth every 6 (six) hours as needed for moderate pain.  05/09/17   Long, Wonda Olds, MD  triazolam (HALCION) 0.25 MG tablet Take 0.25 mg by mouth at bedtime as needed for sleep.    [provider]    Physical Exam: Vitals:   05/24/17 1147 05/24/17 1402 05/24/17 1547 05/24/17 1615  BP: (!) 148/103 (!) 136/102 (!) 150/96 (!) 166/104  Pulse: (!) 101  (!) 102 96  Resp: 20 18 17 20   Temp: 98.5 F (36.9 C)   98.5 F (36.9 C)  TempSrc: Oral   Oral  SpO2: 95%  96% 96%      Constitutional: NAD, calm, comfortable Vitals:   05/24/17 1147 05/24/17 1402 05/24/17 1547 05/24/17 1615  BP: (!) 148/103  (!) 136/102 (!) 150/96 (!) 166/104  Pulse: (!) 101  (!) 102 96  Resp: 20 18 17 20   Temp: 98.5 F (36.9 C)   98.5 F (36.9 C)  TempSrc: Oral   Oral  SpO2: 95%  96% 96%   Elderly male appears older than stated age, uncomfortable appearing Eyes: PERRL, lids and conjunctivae normal ENMT:  mucosa moist and pink Neck: normal, supple Respiratory:  poor air movement bilaterally no wheezes or rhonchi appreciated  CardiovascS1-S2 regular rate rhythm no murmurs or rubs noted Abdomen: no tenderness, no masses palpated.  Bowel sounds positive.  Musculoskeletal: left above-knee amputation site is tender to palpation, no redness swelling or open wounds etc.,  Skin: no rashes, lesions, ulcers. No induration Neurologic: CN 2-12 grossly intact. Sensation intact, DTR normal. Strength 5/5 in all 4.  Psychiatric: Normal judgment and insight.  Labs on Admission: I have personally reviewed following labs and imaging studies  CBC:  Recent Labs Lab 05/24/17 1327  WBC 7.3  NEUTROABS 5.2  HGB 15.8  HCT 45.7  MCV 89.3  PLT 500   Basic Metabolic Panel:  Recent Labs Lab 05/24/17 1327  NA 131*  K 5.1  CL 101  CO2 19*  GLUCOSE 103*  BUN 29*  CREATININE 1.45*  CALCIUM 9.4   GFR: CrCl cannot be calculated (Unknown ideal weight.). Liver Function Tests:  Recent Labs Lab 05/24/17 1327  AST 14*  ALT 13*  ALKPHOS 51  BILITOT 1.1  PROT 6.9  ALBUMIN 3.8   No results for input(s): LIPASE, AMYLASE in the last 168 hours. No results for input(s): AMMONIA in the last 168 hours. Coagulation Profile: No results for input(s): INR, PROTIME in the last 168 hours. Cardiac Enzymes: No results for input(s): CKTOTAL, CKMB, CKMBINDEX, TROPONINI in the last 168 hours. BNP (last 3 results) No results for input(s): PROBNP in the last 8760 hours. HbA1C: No results for input(s): HGBA1C in the last 72 hours. CBG: No results for input(s): GLUCAP in the last 168 hours. Lipid Profile: No results for  input(s): CHOL, HDL, LDLCALC, TRIG, CHOLHDL, LDLDIRECT in the last 72  hours. Thyroid Function Tests: No results for input(s): TSH, T4TOTAL, FREET4, T3FREE, THYROIDAB in the last 72 hours. Anemia Panel: No results for input(s): VITAMINB12, FOLATE, FERRITIN, TIBC, IRON, RETICCTPCT in the last 72 hours. Urine analysis:    Component Value Date/Time   COLORURINE YELLOW 05/24/2017 [place] 05/24/2017 1327   LABSPEC 1.019 05/24/2017 1327   PHURINE 6.0 05/24/2017 1327   GLUCOSEU NEGATIVE 05/24/2017 1327   HGBUR NEGATIVE 05/24/2017 1327   BILIRUBINUR NEGATIVE 05/24/2017 1327   KETONESUR 20 (A) 05/24/2017 1327   PROTEINUR 30 (A) 05/24/2017 1327   UROBILINOGEN 0.2 09/10/2014 1715   NITRITE NEGATIVE 05/24/2017 1327   LEUKOCYTESUR NEGATIVE 05/24/2017 1327   Sepsis Labs: @LABRCNTIP (procalcitonin:4,lacticidven:4) )No results found for this or any previous visit (from the past 240 hour(s)).   Radiological Exams on Admission: No results found.  EKG: I have independently reviewed his EKG, nonspecific T-wave changes in anterolateral leads   Assessment/Plan  1. Severe PAD with left AKA stump rest pain - I suspect he has progression of his PAD secondary to poorly controlled diabetes and heavy tobacco use -Dopplers negative for DVT, clinically do not see any signs of infection or active inflammation- -Start IV narcotics for pain control -I will also start him on a fentanyl patch -Vascular surgery consult was requested discussed with Dr. Trula Slade, he felt there wasn't very much they could offer him but agreed to see him in consultation.  2. CAD status post CABG -with intermittent angina relieved with sublingual nitroglycerin EKG on admission with nonspecific T-wave changes - will check 2-D echocardiogram, to assess LVEF - has been lost to follow-up -Cycle troponins   3. Tobacco abuse  -counseled   4. Alcohol abuse -Thiamine 100 mg daily, monitor on CIWA protocol-  5.  Severe PAD, left AKA  -see discussion above  6.  CK D3  -stable, hold ACE inhibitor and stop NSAIDs   7. Left nephrectomy  Remote-   8. Diabetes mellitus - on low-dose Lantus at home which we will hold for now, sliding scale insulin, follow-up hemoglobin A1c   DVT prophylaxis: lovenox 30mg   Code Status: Full Code Family Communication:  Spouse at bedside Disposition Plan: Inpatient Consults called: [facility] Admission status: inpatient  Domenic Polite MD [facility] Pager [phone]  If 7PM-7AM, please contact night-coverage [email or web address] Password [facility]  05/24/2017, 4:47 PM

## 2017-05-24 NOTE — ED Triage Notes (Signed)
Pt c/o L hip pain x several weeks, worsening over the past week, pt states pain is radiating to L thigh. Pt with L AKA. Pt states he has increased his oxycodone to 30mg  qid and was to have an MRI but he states he is unable to have a closed MRI and unable to lie in the MRI machine due to pain and now nausea. Pt states intermittent vomiting and weight loss x 1 week.

## 2017-05-24 NOTE — ED Notes (Signed)
Gave report to Elzie Rings, Therapist, sports for room [phone].

## 2017-05-24 NOTE — ED Notes (Signed)
Bed: WA24 Expected date:  Expected time:  Means of arrival:  Comments: 

## 2017-05-24 NOTE — Progress Notes (Signed)
Pt arrived from ED on stretcher, slid self to bed. VSS. Pt oriented to callbell and environment. POC  Discussed. Medicated for 10/10 lt stump pain. Will monitor.

## 2017-05-24 NOTE — Progress Notes (Signed)
VASCULAR LAB PRELIMINARY  PRELIMINARY  PRELIMINARY  PRELIMINARY  Bilateral lower extremity venous duplex completed.    Preliminary report:  Right:  No evidence of DVT, superficial thrombosis, or Baker's cyst. Left:  No evidence of DVTor superficial thrombosis noted in the thigh or Baker's cyst. Patient is a left AKA  [name], RVS 05/24/2017, 3:07 PM

## 2017-05-24 NOTE — ED Notes (Signed)
Call Elzie Rings, [place] at [phone] at [phone]

## 2017-05-24 NOTE — ED Notes (Signed)
Hospitalist at bedside 

## 2017-05-24 NOTE — Progress Notes (Signed)
CRITICAL VALUE ALERT  Critical Value:  Troponin  Date & Time Notied:  8:09 PM 05/24/17  Provider Notified: [place]  Orders Received/Actions taken: N/a

## 2017-05-24 NOTE — ED Notes (Signed)
Patient has been transported to radiology.

## 2017-05-25 ENCOUNTER — Inpatient Hospital Stay (HOSPITAL_COMMUNITY): Payer: Medicare Other

## 2017-05-25 DIAGNOSIS — M79605 Pain in left leg: Secondary | ICD-10-CM

## 2017-05-25 DIAGNOSIS — M25552 Pain in left hip: Secondary | ICD-10-CM

## 2017-05-25 DIAGNOSIS — L89321 Pressure ulcer of left buttock, stage 1: Secondary | ICD-10-CM

## 2017-05-25 DIAGNOSIS — L8990 Pressure ulcer of unspecified site, unspecified stage: Secondary | ICD-10-CM | POA: Insufficient documentation

## 2017-05-25 DIAGNOSIS — I2510 Atherosclerotic heart disease of native coronary artery without angina pectoris: Secondary | ICD-10-CM

## 2017-05-25 DIAGNOSIS — J449 Chronic obstructive pulmonary disease, unspecified: Secondary | ICD-10-CM

## 2017-05-25 DIAGNOSIS — N183 Chronic kidney disease, stage 3 (moderate): Secondary | ICD-10-CM

## 2017-05-25 DIAGNOSIS — I739 Peripheral vascular disease, unspecified: Secondary | ICD-10-CM

## 2017-05-25 DIAGNOSIS — I10 Essential (primary) hypertension: Secondary | ICD-10-CM

## 2017-05-25 LAB — COMPREHENSIVE METABOLIC PANEL
ALT: 13 U/L — AB (ref 17–63)
ANION GAP: 11 (ref 5–15)
AST: 16 U/L (ref 15–41)
Albumin: 3.7 g/dL (ref 3.5–5.0)
Alkaline Phosphatase: 48 U/L (ref 38–126)
BUN: 32 mg/dL — ABNORMAL HIGH (ref 6–20)
CHLORIDE: 98 mmol/L — AB (ref 101–111)
CO2: 21 mmol/L — AB (ref 22–32)
Calcium: 9 mg/dL (ref 8.9–10.3)
Creatinine, Ser: 1.64 mg/dL — ABNORMAL HIGH (ref 0.61–1.24)
GFR calc non Af Amer: 40 mL/min — ABNORMAL LOW (ref >60)
GFR, EST AFRICAN AMERICAN: 46 mL/min — AB (ref >60)
Glucose, Bld: 123 mg/dL — ABNORMAL HIGH (ref 65–99)
Potassium: 4.7 mmol/L (ref 3.5–5.1)
SODIUM: 130 mmol/L — AB (ref 135–145)
Total Bilirubin: 0.7 mg/dL (ref 0.3–1.2)
Total Protein: 6.8 g/dL (ref 6.5–8.1)

## 2017-05-25 LAB — CBC
HEMATOCRIT: 44.9 % (ref 39.0–52.0)
HEMOGLOBIN: 15.3 g/dL (ref 13.0–17.0)
MCH: 31.1 pg (ref 26.0–34.0)
MCHC: 34.1 g/dL (ref 30.0–36.0)
MCV: 91.3 fL (ref 78.0–100.0)
Platelets: 214 10*3/uL (ref 150–400)
RBC: 4.92 MIL/uL (ref 4.22–5.81)
RDW: 14 % (ref 11.5–15.5)
WBC: 7 10*3/uL (ref 4.0–10.5)

## 2017-05-25 LAB — GLUCOSE, CAPILLARY
GLUCOSE-CAPILLARY: 97 mg/dL (ref 65–99)
GLUCOSE-CAPILLARY: 123 mg/dL — AB (ref 65–99)
Glucose-Capillary: 82 mg/dL (ref 65–99)
Glucose-Capillary: 125 mg/dL — ABNORMAL HIGH (ref 65–99)
Glucose-Capillary: 129 mg/dL — ABNORMAL HIGH (ref 65–99)

## 2017-05-25 LAB — TROPONIN I
Troponin I: 0.05 ng/mL (ref <0.03)
Troponin I: 0.05 ng/mL (ref <0.03)

## 2017-05-25 MED ORDER — ENOXAPARIN SODIUM 40 MG/0.4ML ~~LOC~~ SOLN
40.0000 mg | SUBCUTANEOUS | Status: DC
  Administered 2017-05-25: 40 mg via SUBCUTANEOUS
  Filled 2017-05-25: qty 0.4

## 2017-05-25 MED ORDER — TRIAZOLAM 0.25 MG PO TABS
0.2500 mg | ORAL_TABLET | Freq: Once | ORAL | Status: AC
  Administered 2017-05-25: 0.25 mg via ORAL
  Filled 2017-05-25: qty 1

## 2017-05-25 MED ORDER — NICOTINE 21 MG/24HR TD PT24
21.0000 mg | MEDICATED_PATCH | Freq: Every day | TRANSDERMAL | Status: DC
  Administered 2017-05-25 – 2017-05-26 (×2): 21 mg via TRANSDERMAL
  Filled 2017-05-25 (×2): qty 1

## 2017-05-25 MED ORDER — TRIAZOLAM 0.25 MG PO TABS: 0.5000 mg | ORAL_TABLET | Freq: Every evening | ORAL | Status: DC | PRN

## 2017-05-25 MED ORDER — DICLOFENAC SODIUM 1 % TD GEL
2.0000 g | Freq: Four times a day (QID) | TRANSDERMAL | Status: DC
  Administered 2017-05-25 – 2017-05-26 (×3): 2 g via TOPICAL
  Filled 2017-05-25: qty 100

## 2017-05-25 MED ORDER — LIDOCAINE 5 % EX PTCH
1.0000 | MEDICATED_PATCH | CUTANEOUS | Status: DC
  Administered 2017-05-25: 1 via TRANSDERMAL
  Filled 2017-05-25 (×2): qty 1

## 2017-05-25 MED ORDER — PERFLUTREN LIPID MICROSPHERE
1.0000 mL | INTRAVENOUS | Status: AC | PRN
  Administered 2017-05-25: 3 mL via INTRAVENOUS
  Filled 2017-05-25: qty 10

## 2017-05-25 MED ORDER — BOOST / RESOURCE BREEZE PO LIQD
1.0000 | Freq: Three times a day (TID) | ORAL | Status: DC
  Administered 2017-05-26: 1 via ORAL

## 2017-05-25 MED ORDER — LORAZEPAM 2 MG/ML IJ SOLN
1.0000 mg | Freq: Once | INTRAMUSCULAR | Status: AC | PRN
  Administered 2017-05-26: 1 mg via INTRAVENOUS
  Filled 2017-05-25: qty 1

## 2017-05-25 MED ORDER — PERFLUTREN LIPID MICROSPHERE
INTRAVENOUS | Status: AC
  Filled 2017-05-25: qty 10

## 2017-05-25 MED ORDER — DULOXETINE HCL 30 MG PO CPEP
30.0000 mg | ORAL_CAPSULE | Freq: Every day | ORAL | Status: DC
  Administered 2017-05-25 – 2017-05-26 (×2): 30 mg via ORAL
  Filled 2017-05-25 (×2): qty 1

## 2017-05-25 NOTE — Progress Notes (Signed)
  Echocardiogram 2D Echocardiogram with Definity has been performed.  Darlina Sicilian M 05/25/2017, 3:37 PM

## 2017-05-25 NOTE — Progress Notes (Signed)
   05/25/17 0100  Vitals  Ectopy Sinus pause;Other (Comment) (RN Jadzia Ibsen notified)  Ectopy Frequency Rare    On call Schorr made aware pt had 1.37 sinus pause and asymptomatic.

## 2017-05-25 NOTE — Progress Notes (Signed)
[facility] [place]  Patient name: [name] MRN: [id number] DOB: 1940/09/09 Sex: male   REQUESTING PROVIDER:    Hospital service   REASON FOR CONSULT:    Pain in left AKA stump  HISTORY OF PRESENT ILLNESS:   [name] is a 76 y.o. male, who is well known to me having undergone stenting of his right leg and a left AKA.  He was admitted for severe pain in his stump which he describes as if someone is sticking a hot poker into his leg.  The pain goes all the way up into his hip.  It is worse when it is elevated.  He is on high dose nerve medication as well as narcotics.  HE continues to smoke.  Nothing provides any significant relief  PAST MEDICAL HISTORY    Past Medical History:  Diagnosis Date  . Anginal pain ([place])   . Arthritis    "qwhere" (07/09/2013)  . Asthma   . COPD (chronic obstructive pulmonary disease) ([place])    pt denies this hx on 07/09/2013  . Coronary artery disease   . Gout   . H/O hiatal hernia 1997  . Heart murmur    "had rheumatic fever as a kid" (07/09/2013)  . High cholesterol   . Hypertension   . Myocardial infarction ([place]) 03/2002; ~ 2011  . Pneumonia    "once" (07/09/2013)  . Renal disorder    "only have 1 kidney; due to go back soon to check the other one" (07/09/2013)  . Rheumatic fever   . RUPTURE ROTATOR CUFF 12/03/2009   Qualifier: Diagnosis of  By: Aline Brochure MD, Dorothyann Peng    . Shortness of breath    "can happen at anytime" (07/09/2013)  . Type II diabetes mellitus ([place])   . Ureter cancer ([place]) 12/2001   "shut down my kidney & resulted in nephrectomy" (07/09/2013)     FAMILY HISTORY   Family History  Problem Relation Age of Onset  . Cancer Mother   . Heart attack Father     SOCIAL HISTORY:   Social History   Social History  . Marital status: Married    Spouse name: N/A  . Number of children: N/A  . Years of education: N/A   Occupational History  . Not on file.    Social History Main Topics  . Smoking status: Current Every Day Smoker    Packs/day: 1.00    Years: 54.00    Types: Cigarettes  . Smokeless tobacco: Never Used  . Alcohol use 0.0 oz/week     Comment: social drinker  . Drug use: No  . Sexual activity: No   Other Topics Concern  . Not on file   Social History Narrative  . No narrative on file    ALLERGIES:    Allergies  Allergen Reactions  . Erythromycin Anaphylaxis and Rash  . Iohexol Anaphylaxis, Hives, Itching and Other (See Comments)    Note:  13 HR PRE-MED GIVEN AND SUCCESSFUL-ARS 07/15/07.     Marland Kitchen Penicillins Anaphylaxis, Itching and Other (See Comments)    Has patient had a PCN reaction causing immediate rash, facial/tongue/throat swelling, SOB or lightheadedness with hypotension: Yes Has patient had a PCN reaction causing severe rash involving mucus membranes or skin necrosis: No Has patient had a PCN reaction that required hospitalization No Has patient had a PCN reaction occurring within the last 10 years: No If all of the above answers are "NO", then may proceed with Cephalosporin  use.    CURRENT MEDICATIONS:    Current Facility-Administered Medications  Medication Dose Route Frequency Provider Last Rate Last Dose  . albuterol (PROVENTIL) (2.5 MG/3ML) 0.083% nebulizer solution 2.5 mg  2.5 mg Nebulization Q6H PRN Domenic Polite, MD      . albuterol (PROVENTIL) (2.5 MG/3ML) 0.083% nebulizer solution 3 mL  3 mL Inhalation Q4H PRN Domenic Polite, MD      . aspirin EC tablet 81 mg  81 mg Oral QHS Domenic Polite, MD   81 mg at 05/24/17 2122  . enoxaparin (LOVENOX) injection 40 mg  40 mg Subcutaneous Q24H Pham, Anh P, RPH      . feeding supplement (BOOST / RESOURCE BREEZE) liquid 1 Container  1 Container Oral TID BM Elodia Florence., MD      . fentaNYL (DURAGESIC - dosed mcg/hr) patch 25 mcg  25 mcg Transdermal Q72H Domenic Polite, MD   25 mcg at 05/24/17 2010  . gabapentin (NEURONTIN) capsule 600 mg  600  mg Oral Daily Domenic Polite, MD   600 mg at 05/25/17 0829  . gabapentin (NEURONTIN) capsule 900 mg  900 mg Oral QHS Domenic Polite, MD   900 mg at 05/24/17 2122  . HYDROmorphone (DILAUDID) injection 2 mg  2 mg Intravenous Q3H PRN Domenic Polite, MD   2 mg at 05/25/17 1540  . insulin aspart (novoLOG) injection 0-9 Units  0-9 Units Subcutaneous TID WC Domenic Polite, MD      . mometasone-formoterol [facility]) 200-5 MCG/ACT inhaler 2 puff  2 puff Inhalation BID Domenic Polite, MD   2 puff at 05/25/17 1005  . nicotine (NICODERM CQ - dosed in mg/24 hours) patch 21 mg  21 mg Transdermal Daily Elodia Florence., MD      . nitroGLYCERIN (NITROSTAT) SL tablet 0.4 mg  0.4 mg Sublingual Q5 min PRN Domenic Polite, MD      . ondansetron [facility]) tablet 4 mg  4 mg Oral Q6H PRN Domenic Polite, MD       Or  . ondansetron [facility]) injection 4 mg  4 mg Intravenous Q6H PRN Domenic Polite, MD   4 mg at 05/25/17 1348  . oxyCODONE (Oxy IR/ROXICODONE) immediate release tablet 20 mg  20 mg Oral Q4H PRN Domenic Polite, MD   20 mg at 05/25/17 1026  . promethazine (PHENERGAN) injection 12.5 mg  12.5 mg Intravenous Q6H PRN Domenic Polite, MD      . senna-docusate (Senokot-S) tablet 1 tablet  1 tablet Oral QHS PRN Domenic Polite, MD      . thiamine (VITAMIN B-1) tablet 100 mg  100 mg Oral Daily Domenic Polite, MD   100 mg at 05/25/17 0867  . traMADol (ULTRAM) tablet 50 mg  50 mg Oral Q6H PRN Domenic Polite, MD   50 mg at 05/25/17 0052  . triazolam (HALCION) tablet 0.25 mg  0.25 mg Oral QHS PRN Domenic Polite, MD   0.25 mg at 05/24/17 2215  . zolpidem (AMBIEN) tablet 5 mg  5 mg Oral QHS PRN Domenic Polite, MD   5 mg at 05/24/17 2214    REVIEW OF SYSTEMS:   [X]  denotes positive finding, [ ]  denotes negative finding Cardiac  Comments:  Chest pain or chest pressure:    Shortness of breath upon exertion:    Short of breath when lying flat:    Irregular heart rhythm:        Vascular    Pain in calf,  thigh, or hip brought on by ambulation:  x   Pain in feet at night that wakes you up from your sleep:  x   Blood clot in your veins:    Leg swelling:         Pulmonary    Oxygen at home:    Productive cough:     Wheezing:         Neurologic    Sudden weakness in arms or legs:     Sudden numbness in arms or legs:     Sudden onset of difficulty speaking or slurred speech:    Temporary loss of vision in one eye:     Problems with dizziness:         Gastrointestinal    Blood in stool:      Vomited blood:         Genitourinary    Burning when urinating:     Blood in urine:        Psychiatric    Major depression:         Hematologic    Bleeding problems:    Problems with blood clotting too easily:        Skin    Rashes or ulcers:        Constitutional    Fever or chills:     PHYSICAL EXAM:   Vitals:   05/25/17 0052 05/25/17 0609 05/25/17 1009 05/25/17 1405  BP: (!) 142/90 (!) 151/91  123/87  Pulse:  91  100  Resp:  19  18  Temp:  98.2 F (36.8 C)  98.3 F (36.8 C)  TempSrc:  Oral  Oral  SpO2:  98% 98% 96%  Weight:      Height:        GENERAL: The patient is a well-nourished male, in no acute distress. The vital signs are documented above. CARDIAC: There is a regular rate and rhythm.  VASCULAR: palpable left femoral pulse PULMONARY: Nonlabored respirations ABDOMEN: Soft and non-tender with normal pitched bowel sounds.  MUSCULOSKELETAL: no open areas or skin breakdown on stump. NEUROLOGIC: No focal weakness or paresthesias are detected. SKIN: There are no ulcers or rashes noted. PSYCHIATRIC: The patient has a normal affect.  STUDIES:   none  ASSESSMENT and PLAN   Chronic pain in left AKA stump.  I told the patient there is likely nothing that can be done from a vascular perspective to help with his pain.  He has a palpable left femoral pulse.  I will check a aorto-iliac duplex to make sure that there is not a significant inflow lesion.  He would benefit  from a chronic pain clinic referral.  We discussed the possibility of a nerve block.  Once the results of the duplex are back, he could be discharged from my perspective.  I do not think a more proximal amputaion (hip disarticulation) would alleviate his symptoms   Annamarie Major, MD [facility] [phone] Pager [phone]

## 2017-05-25 NOTE — Progress Notes (Addendum)
Initial Nutrition Assessment  INTERVENTION:   Provide Boost Breeze po TID, each supplement provides 250 kcal and 9 grams of protein RD will continue to monitor  NUTRITION DIAGNOSIS:   Inadequate oral intake related to nausea, vomiting, poor appetite as evidenced by per patient/family report.  GOAL:   Patient will meet greater than or equal to 90% of their needs  MONITOR:   PO intake, Supplement acceptance, Labs, Weight trends, I & O's  REASON FOR ASSESSMENT:   Consult Assessment of nutrition requirement/status  ASSESSMENT:    76 y.o. male with medical history significant for CAD status post CABG 6-7 years ago, diabetes on insulin, COPD, heavy smoker, left nephrectomy, severe PAD status post left AKA in 2011.  Patient in room in visible discomfort. Pt reports he is feeling very nauseous. Room smells of smoke. Notified RN outside of room who states pt just came back inside from a walk. Pt states he has had variable nausea and vomiting for about 8 weeks now. Pt states he has not eaten anything today d/t nausea levels. Was just provided IV Zofran. Will order Boost Breeze supplements while pt's PO intake is low.  Per chart review, pt has gained weight. UBW is 544 lb but uncertain of time frame when he last weighed this much. Nutrition focused physical exam shows no sign of depletion of muscle mass or body fat.  Medications: Thiamine tablet daily, IV Zofran PRN Labs reviewed:  Low Na GFR: 40 HgbA1c: 6.8  Diet Order:  Diet Carb Modified Fluid consistency: Thin; Room service appropriate? Yes  Skin:  Wound (see comment) (Stage I on coccyx)  Last BM:  9/14  Height:   Ht Readings from Last 1 Encounters:  05/24/17 6\' 3"  (1.905 m)    Weight:   Wt Readings from Last 1 Encounters:  05/24/17 196 lb 9.6 oz (89.2 kg)    Ideal Body Weight:  80.1 kg -adjusted for L AKA  BMI:  Body mass index is 27.0 kg/m. -adjusted for L AKA  Estimated Nutritional Needs:   Kcal:   2200-2400  Protein:  100-110g  Fluid:  2.2L/day  EDUCATION NEEDS:   No education needs identified at this time  Clayton Bibles, MS, RD, LDN Pager: [phone] After Hours Pager: [phone]

## 2017-05-25 NOTE — Progress Notes (Signed)
On call schorr notified patients troponin has increased at this time. Pt asymptomatic. This RN will continue to monitor pt closely.

## 2017-05-25 NOTE — Progress Notes (Signed)
PROGRESS NOTE    [name]  OEU:[id number] DOB: November 11, 1940 DOA: 05/24/2017 PCP: Lucia Gaskins, MD (Confirm with patient/family/NH records and if not entered, this HAS to be entered at [facility] point of entry. "No PCP" if truly none.)   Brief Narrative: (Start on day 1 of progress note - keep it brief and live) [name] is [name] 76 y.o. male with medical history significant for CAD status post CABG 6-7 years ago, diabetes on insulin, COPD, heavy smoker, left nephrectomy, severe PAD status post left AKA in 2011 presenting with pain in his L AKA stump.    Assessment & Plan:   Active Problems:   Chronic total occlusion of artery of the extremities (HCC)   Peripheral vascular disease (HCC)   Alcohol abuse   CAD in native artery   Protein-calorie malnutrition (HCC)   CKD (chronic kidney disease), stage III   PAD (peripheral artery disease) ([place])   1. Severe PAD with left AKA stump rest pain - possibly 2/2 progression of PAD.  Exam and plain films without evidence of acute abnormality.  Dopplers negative for VTE. - fentanyl patch, dilaudid IV ordered in addition to oxycodone 20 mg q4 - will order voltaren and lidocaine patch - adding cymbalta, continue gabapentin - outpatient pain referral may be most appropriate [ ]  vascular c/s pending [ ]  MRI lumbar spine is pending as outpatient, will try to get this here (he notes pain is worse with flexion of hip? radiculopathy?) [ ]  Vascular surgery consult, appreciate recs  2. CAD status post CABG - notes he has occasional sharp central CP that lasts seconds.  His troponin mildly elevated on admission.  Currently CP free.  Echo pending.  Will need outpatient f/u with cardiology pending echo result.  - repeat EKG, appears similar to priors [ ]  echo  3. Tobacco abuse  -counseled   4. Alcohol abuse -Thiamine 100 mg daily, monitor on CIWA protocol  5. Severe PAD, left AKA  -see discussion above  6.  CK D3  -stable, hold ACE  inhibitor and stop NSAIDs   7. Left nephrectomy  Remote-   8. Diabetes mellitus - on low-dose Lantus at home which we will hold for now, sliding scale insulin, follow-up hemoglobin A1c  9. Pressure ulcer: stage 1 foam dressing to L hip  DVT prophylaxis: lovenox Code Status: full  Family Communication: none in room  Disposition Plan: pending pain control   Consultants:   vascular  Procedures: (Don't include imaging studies which can be auto populated. Include things that cannot be auto populated i.e. Echo, Carotid and venous dopplers, Foley, Bipap, HD, tubes/drains, wound vac, central lines etc)  [name], echo pending  Antimicrobials: (specify start and planned stop date. Auto populated tables are space occupying and do not give end dates)  none    Subjective: Pain for past 1.5 month. Progressively worse.  L stump.  From hip to stump.  No cp now, no cp with exertion.  Occasionally with CP substernal, fleeting.    Objective: Vitals:   05/24/17 2156 05/25/17 0052 05/25/17 0609 05/25/17 1009  BP: (!) 145/87 (!) 142/90 (!) 151/91   Pulse: 95  91   Resp: 18  19   Temp: 98.5 F (36.9 C)  98.2 F (36.8 C)   TempSrc: Oral  Oral   SpO2: 95%  98% 98%  Weight:      Height:        Intake/Output Summary (Last 24 hours) at 05/25/17 1126 Last data filed  at 05/24/17 1855  Gross per 24 hour  Intake              120 ml  Output                0 ml  Net              120 ml   Filed Weights   05/24/17 1835  Weight: 89.2 kg (196 lb 9.6 oz)    Examination:  General exam: Appears calm and comfortable  Respiratory system: Clear to auscultation. Respiratory effort normal. Cardiovascular system: S1 & S2 heard, RRR. No JVD, murmurs, rubs, gallops or clicks. No pedal edema. Gastrointestinal system: Abdomen is nondistended, soft and nontender. No organomegaly or masses felt. Normal bowel sounds heard. Central nervous system: Alert and oriented. No focal neurological  deficits. Extremities: L AKA stump, expresses Ttp when palpating leg, no redness, erythema, swelling Skin: No rashes, lesions or ulcers Psychiatry: Judgement and insight appear normal. Mood & affect appropriate.     Data Reviewed: I have personally reviewed following labs and imaging studies  CBC:  Recent Labs Lab 05/24/17 1327  WBC 7.3  NEUTROABS 5.2  HGB 15.8  HCT 45.7  MCV 89.3  PLT 660   Basic Metabolic Panel:  Recent Labs Lab 05/24/17 1327  NA 131*  K 5.1  CL 101  CO2 19*  GLUCOSE 103*  BUN 29*  CREATININE 1.45*  CALCIUM 9.4   GFR: Estimated Creatinine Clearance: 53.4 mL/min ([name]) (by C-G formula based on SCr of 1.45 mg/dL (H)). Liver Function Tests:  Recent Labs Lab 05/24/17 1327  AST 14*  ALT 13*  ALKPHOS 51  BILITOT 1.1  PROT 6.9  ALBUMIN 3.8   No results for input(s): LIPASE, AMYLASE in the last 168 hours. No results for input(s): AMMONIA in the last 168 hours. Coagulation Profile: No results for input(s): INR, PROTIME in the last 168 hours. Cardiac Enzymes:  Recent Labs Lab 05/24/17 1904 05/25/17 0009 05/25/17 0859  TROPONINI 0.04* 0.05* 0.05*   BNP (last 3 results) No results for input(s): PROBNP in the last 8760 hours. HbA1C:  Recent Labs  05/24/17 1904  HGBA1C 6.8*   CBG:  Recent Labs Lab 05/24/17 1842 05/24/17 2152 05/25/17 0743  GLUCAP 97 95 82   Lipid Profile: No results for input(s): CHOL, HDL, LDLCALC, TRIG, CHOLHDL, LDLDIRECT in the last 72 hours. Thyroid Function Tests: No results for input(s): TSH, T4TOTAL, FREET4, T3FREE, THYROIDAB in the last 72 hours. Anemia Panel: No results for input(s): VITAMINB12, FOLATE, FERRITIN, TIBC, IRON, RETICCTPCT in the last 72 hours. Sepsis Labs: No results for input(s): PROCALCITON, LATICACIDVEN in the last 168 hours.  No results found for this or any previous visit (from the past 240 hour(s)).       Radiology Studies: Dg Chest 2 View  Result Date: 05/24/2017 CLINICAL  DATA:  Chest pain EXAM: CHEST  2 VIEW COMPARISON:  05/09/2017 FINDINGS: Previous median sternotomy. Normal heart size and vascularity. Mild hyperinflation without focal pneumonia, collapse or consolidation. Negative for edema, effusion or pneumothorax. Trachea is midline. Atherosclerosis of the aorta. Previous upper lumbar vertebral augmentation noted on the lateral view. IMPRESSION: Stable chest exam.  No interval change or acute process Electronically Signed   By: Jerilynn Mages.  Shick M.D.   On: 05/24/2017 17:28   Dg Hip Unilat W Or Wo Pelvis 2-3 Views Left  Result Date: 05/24/2017 CLINICAL DATA:  Radiating left hip pain for 3 weeks. EXAM: DG HIP (WITH OR WITHOUT PELVIS) 2-3V  LEFT COMPARISON:  05/24/2017 FINDINGS: Bony pelvis and hips appear symmetric and intact. Negative for fracture or malalignment. Degenerative changes of the lower lumbar spine, SI joints and both hips. Aortic, iliac and femoral atherosclerosis present. Right femoral stent noted. IMPRESSION: No acute osseous finding. Electronically Signed   By: Jerilynn Mages.  Shick M.D.   On: 05/24/2017 17:30   Dg Femur Min 2 Views Left  Result Date: 05/24/2017 CLINICAL DATA:  Radiating left hip pain, previous left above knee amputation EXAM: LEFT FEMUR 2 VIEWS COMPARISON:  05/24/2017 FINDINGS: Remote left above knee amputation. No other acute osseous finding. Expected postoperative changes. No definite soft tissue abnormality. Peripheral atherosclerosis noted. Degenerative changes of the left hip. IMPRESSION: No acute finding by plain radiography. Previous left above knee amputation. Electronically Signed   By: Jerilynn Mages.  Shick M.D.   On: 05/24/2017 17:31        Scheduled Meds: . aspirin EC  81 mg Oral QHS  . enoxaparin (LOVENOX) injection  40 mg Subcutaneous Q24H  . fentaNYL  25 mcg Transdermal Q72H  . gabapentin  600 mg Oral Daily  . gabapentin  900 mg Oral QHS  . insulin aspart  0-9 Units Subcutaneous TID WC  . mometasone-formoterol  2 puff Inhalation BID  .  thiamine  100 mg Oral Daily   Continuous Infusions:   LOS: 1 day    Time spent: Alba, MD [facility] [phone]  If 7PM-7AM, please contact night-coverage [email or web address] Password [email or web address] 05/25/2017, 11:26 AM

## 2017-05-26 ENCOUNTER — Inpatient Hospital Stay (HOSPITAL_COMMUNITY): Payer: Medicare Other

## 2017-05-26 DIAGNOSIS — I739 Peripheral vascular disease, unspecified: Secondary | ICD-10-CM

## 2017-05-26 DIAGNOSIS — E46 Unspecified protein-calorie malnutrition: Secondary | ICD-10-CM

## 2017-05-26 LAB — BASIC METABOLIC PANEL
ANION GAP: 10 (ref 5–15)
BUN: 33 mg/dL — ABNORMAL HIGH (ref 6–20)
CALCIUM: 9.2 mg/dL (ref 8.9–10.3)
CO2: 21 mmol/L — AB (ref 22–32)
Chloride: 102 mmol/L (ref 101–111)
Creatinine, Ser: 1.64 mg/dL — ABNORMAL HIGH (ref 0.61–1.24)
GFR calc Af Amer: 46 mL/min — ABNORMAL LOW (ref >60)
GFR, EST NON AFRICAN AMERICAN: 40 mL/min — AB (ref >60)
GLUCOSE: 111 mg/dL — AB (ref 65–99)
Potassium: 5 mmol/L (ref 3.5–5.1)
Sodium: 133 mmol/L — ABNORMAL LOW (ref 135–145)

## 2017-05-26 LAB — ECHOCARDIOGRAM COMPLETE
HEIGHTINCHES: 75 in
Weight: 3145.6 oz

## 2017-05-26 LAB — GLUCOSE, CAPILLARY
GLUCOSE-CAPILLARY: 121 mg/dL — AB (ref 65–99)
Glucose-Capillary: 141 mg/dL — ABNORMAL HIGH (ref 65–99)
Glucose-Capillary: 100 mg/dL — ABNORMAL HIGH (ref 65–99)

## 2017-05-26 MED ORDER — LIDOCAINE 5 % EX PTCH: 1.0000 | MEDICATED_PATCH | CUTANEOUS | 0 refills | Status: DC

## 2017-05-26 MED ORDER — DICLOFENAC SODIUM 1 % TD GEL: 2.0000 g | Freq: Four times a day (QID) | TRANSDERMAL | 0 refills | Status: DC

## 2017-05-26 MED ORDER — THIAMINE HCL 100 MG PO TABS: 100.0000 mg | ORAL_TABLET | Freq: Every day | ORAL | 0 refills | Status: AC

## 2017-05-26 MED ORDER — OXYCODONE HCL 20 MG PO TABS: 20.0000 mg | ORAL_TABLET | Freq: Four times a day (QID) | ORAL | 0 refills | Status: DC | PRN

## 2017-05-26 MED ORDER — DOCUSATE SODIUM 100 MG PO CAPS: 100.0000 mg | ORAL_CAPSULE | Freq: Two times a day (BID) | ORAL | 0 refills | Status: AC

## 2017-05-26 MED ORDER — BOOST / RESOURCE BREEZE PO LIQD: 1.0000 | Freq: Three times a day (TID) | ORAL | 0 refills | Status: AC

## 2017-05-26 MED ORDER — FOAM DRESSING PADS: 1.0000 | MEDICATED_PAD | Freq: Every day | 0 refills | Status: AC

## 2017-05-26 MED ORDER — NICOTINE 21 MG/24HR TD PT24: 21.0000 mg | MEDICATED_PATCH | Freq: Every day | TRANSDERMAL | 0 refills | Status: AC

## 2017-05-26 MED ORDER — DULOXETINE HCL 30 MG PO CPEP: 30.0000 mg | ORAL_CAPSULE | Freq: Every day | ORAL | 0 refills | Status: AC

## 2017-05-26 NOTE — Progress Notes (Signed)
Preliminary results by tech -Ao/Iliac Duplex Completed. The aorta is patent without significant stenosis. Bilateral iliac arteries appear patent with a mild to moderate amount of atherosclerotic plaque without significant hemodynamically stenosis noted. The common femoral artery is patent. The superficial femoral artery demonstrated occlusive disease.  Oda Cogan, BS, RDMS, RVT

## 2017-05-26 NOTE — Progress Notes (Signed)
OT Cancellation Note and Discharge  Patient Details Name: [name] MRN: [id number] DOB: Jul 05, 1941   Cancelled Treatment:    Reason Eval/Treat Not Completed: OT screened, no needs identified, will sign off. In to speak with pt and wife. They feel they can manage all transfers and basic ADLs as a team, that the issue is the pain. Pt has all needed DME at home.  Almon Register [id number] 05/26/2017, 9:09 AM

## 2017-05-26 NOTE — Plan of Care (Signed)
Problem: Pain Managment: Goal: General experience of comfort will improve Outcome: Not Progressing Pt still with c/o pain to left hip 10/10 constantly aching.  Treating with prn Oxy IR as ordered.  MD aware, no further orders.  Will continue with plan of care.   Problem: Physical Regulation: Goal: Ability to maintain clinical measurements within normal limits will improve Outcome: Progressing . Goal: Will remain free from infection Outcome: Completed/Met Date Met: 05/26/17 .  Problem: Skin Integrity: Goal: Risk for impaired skin integrity will decrease Outcome: Progressing .  Problem: Tissue Perfusion: Goal: Risk factors for ineffective tissue perfusion will decrease Outcome: Progressing .  Problem: Activity: Goal: Risk for activity intolerance will decrease Outcome: Progressing .

## 2017-05-26 NOTE — Discharge Summary (Addendum)
Physician Discharge Summary  [name] DOB: [birth year] DOA: 05/24/2017  PCP: Lucia Gaskins, MD  Admit date: 05/24/2017 Discharge date: 05/26/2017  Time spent: over 1 hour    Recommendations for Outpatient Follow-up:  1. Follow up with neurosurgery for MRI findings, given number and told to call Monday (unfortunately schedulers never able to reach him) 2. Follow up with PCP, would recommend follow up with pain clinic - continue uptitrating cymbalta as tolerated.  He's on high doses of opiates, which have escalated over past few months for his pain and would recommend pain management contract if PCP or pain management clinic to continue prescribing with surveillance for increased tolerance or misuse (routine UDS, pill counts).  [name] was prescribed #12 20 mg oxycodone to cover him through the weekend until follow up with PCP.    Of note, he's also receiving ambien and triazolam, which are not typically recommended in combination with high dose opiates. 3. Follow up outpatient BMP/CBC - currently holding ramipril due to CKD, but if he's been tolerating this, it could be restarted 4. Needs outpatient cardiology follow up (echo with mildly decreased EF, but this appears chronic based on care everywhere).   5. F/u with vascular surgery 6. Stage 1 decub on L hip, continue foam dressing, follow up with PCP  Discharge Diagnoses:  Active Problems:   Chronic total occlusion of artery of the extremities (HCC)   Peripheral vascular disease (HCC)   Alcohol abuse   CAD in native artery   Protein-calorie malnutrition ([place])   CKD (chronic kidney disease), stage III   PAD (peripheral artery disease) (HCC)   Pressure injury of skin   Discharge Condition: stable  Diet recommendation: heart healthy  Filed Weights   05/24/17 1835  Weight: 89.2 kg (196 lb 9.6 oz)    History of present illness:  [name] [name] 76 y.o.malewith medical history significant for CAD status  post CABG 6-7 years ago, diabetes on insulin, COPD, heavy smoker, left nephrectomy, severe PAD status post left AKA in 2011 presenting with pain in his L AKA stump.   Hospital Course:  . Severe PAD with left AKA stump rest pain - Unclear etiology, possibly 2/2 progression of PAD, possible radiculopathy/neuropathic pain as described as stabbing/shooting pain.  Exam and plain films without evidence of acute abnormality.  Dopplers negative for VTE.  Preliminary arterial dopplers with patent aorta without significant stenosis, bilateral iliac arteries patent with mild atherosclerotic plaque without significant hemodynamically stenosis noted.  Common femoral artery patent and superficial femoral artery demonstrated occlusive disease.  We obtained an MRI as this was being pursued as an outpatient for his pain and possible radicular type pain and this was notable for severe spinal stenosis at L2-3, L3-4, L4-5, mod bilateral foraminal impingement at L2-3, L3-4 bilateral severe foraminal impingement (worse on L with far lateral disc protrusion ), remote L1 compression fracture (see study).  He denies urinary retention (normal post void), saddle anesthesia, incontinence of bowel/bladder.  Discussed with neurosurgery who planned to arrange outpatient follow up, but they were unable to contact him in the room so I provided him the number to the clinic.  His pain was difficult to control here.  He was on fentanyl patch and IV dilaudid in addition to receiving oxycodone 20 mg q4.  Discharged with oxycodone 20 mg q6 enough for 3 days to get him to his PCP appointment.  [name] reviewed and last prescription for oxycodone prescribed on 8/15 (20 mg, #120, for 30  days).  Had long discussion about use of opiates at high doses like he's been receiving for chronic pain and that alternative methods for pain control would be preferable and recommended.  Discussed that we'd provide him what his previous provider previously prescribed to  get to his next appointment, but further prescriptions would be at his PCP's discretion.  Added cymbalta, lidocaine patch, voltaren.  Continue gabapentin.      [ ]  neurosurgery follow up [ ]  consider pain management follow up [ ]  follow up vascular surgery  2. CAD status post CABG - notes he has occasional sharp central CP that lasts seconds.  His troponin mildly elevated on admission, but appears c/w prior troponins in system.  Currently CP free.  [ ]  echo with EF 45-50%, poor images --- in care everywhere has echo from 2021 with EF 40-45%, so appears relatively unchanged - recommend outpatient follow up with cardiology  3. Tobacco abuse  -counseled   4. Alcohol abuse -Thiamine 100 mg daily, monitor on CIWAprotocol  5. Severe PAD, left AKA  -see discussion above  6. CK D3  -stable, holding ACE inhibitor and stop NSAIDs   7. Left nephrectomy  Remote-   8. Diabetes mellitus -on low-dose Lantus at home which we will hold for now, sliding scale insulin, follow-up hemoglobin A1c  9. Pressure ulcer: stage 1 foam dressing to L hip   Procedures:  Echo as above  Arterial doppler, prelim as above (i.e. Studies not automatically included, echos, thoracentesis, etc; not x-rays)  Consultations:  Neurosurgery recommended outpatient follow up  Vascular surgery  Discharge Exam: Vitals:   05/26/17 0851 05/26/17 1459  BP:  111/66  Pulse:  86  Resp:  17  Temp:  98.5 F (36.9 C)  SpO2: 96% 96%   Ready to go home.  Still has pain.  Asking about fentanyl patch.   General: No acute distress. Cardiovascular: Heart sounds show [name] regular rate, and rhythm. No gallops or rubs. No murmurs. No JVD. Lungs: Clear to auscultation bilaterally with good air movement. No rales, rhonchi or wheezes. Abdomen: Soft, nontender, nondistended with normal active bowel sounds. No masses. No hepatosplenomegaly. Neurological: Alert and oriented 3. Moves all extremities 4 with equal strength.  Cranial nerves II through XII grossly intact. Skin: Warm and dry. No rashes or lesions. Extremities: L AKA  Discharge Instructions   Discharge Instructions    Call MD for:  difficulty breathing, headache or visual disturbances    Complete by:  As directed    Call MD for:  hives    Complete by:  As directed    Call MD for:  persistant dizziness or light-headedness    Complete by:  As directed    Call MD for:  persistant nausea and vomiting    Complete by:  As directed    Call MD for:  redness, tenderness, or signs of infection (pain, swelling, redness, odor or green/yellow discharge around incision site)    Complete by:  As directed    Call MD for:  severe uncontrolled pain    Complete by:  As directed    Call MD for:  temperature >100.4    Complete by:  As directed    Diet - low sodium heart healthy    Complete by:  As directed    Discharge instructions    Complete by:  As directed    You were seen for pain.  You will follow up with neurosurgery and vascular as an outpatient.  I'll  prescribe oxycodone 20 mg to use every 6 hours for 3 days until you can make your follow up appointment (12 pills).  Please continue the cymbalta which is new and can help with pain.  The lidocaine patches may help with your pain as well and I've also prescribed voltaren gel which can help.  Please follow up with your primary care doctor.  Please also follow up with your cardiologist.  I wrote Madalen Gavin prescription for the foam dressing for your hip, please continue to follow this with your primary doctor.  It would probably be beneficial for you to follow up with Gwynne Kemnitz pain doctor, please follow up with your primary care physician regarding this.   Increase activity slowly    Complete by:  As directed      Discharge Medication List as of 05/26/2017  5:21 PM    START taking these medications   Details  diclofenac sodium (VOLTAREN) 1 % GEL Apply 2 g topically 4 (four) times daily., Starting Fri 05/26/2017, Normal     docusate sodium (COLACE) 100 MG capsule Take 1 capsule (100 mg total) by mouth 2 (two) times daily., Starting Fri 05/26/2017, Until Sun 06/25/2017, Normal    DULoxetine (CYMBALTA) 30 MG capsule Take 1 capsule (30 mg total) by mouth daily., Starting Sat 05/27/2017, Until Mon 06/26/2017, Normal    feeding supplement (BOOST / RESOURCE BREEZE) LIQD Take 1 Container by mouth 3 (three) times daily between meals., Starting Fri 05/26/2017, Until Sun 06/25/2017, Normal    Gauze Pads & Dressings (FOAM DRESSING) PADS 1 patch by Does not apply route daily., Starting Fri 05/26/2017, Normal    lidocaine (LIDODERM) 5 % Place 1 patch onto the skin daily. Remove & Discard patch within 12 hours or as directed by MD, Starting Fri 05/26/2017, Normal    nicotine (NICODERM CQ - DOSED IN MG/24 HOURS) 21 mg/24hr patch Place 1 patch (21 mg total) onto the skin daily., Starting Sat 05/27/2017, Normal    !! Oxycodone HCl 20 MG TABS Take 1 tablet (20 mg total) by mouth every 6 (six) hours as needed., Starting Fri 05/26/2017, Print    thiamine 100 MG tablet Take 1 tablet (100 mg total) by mouth daily., Starting Sat 05/27/2017, Until Mon 06/26/2017, Normal     !! - Potential duplicate medications found. Please discuss with provider.    CONTINUE these medications which have NOT CHANGED   Details  albuterol (PROVENTIL HFA;VENTOLIN HFA) 108 (90 Base) MCG/ACT inhaler Inhale 2 puffs into the lungs every 4 (four) hours as needed for wheezing or shortness of breath., Historical Med    cholecalciferol (VITAMIN D) 400 units TABS tablet Take 1,200 Units by mouth 2 (two) times daily., Historical Med    gabapentin (NEURONTIN) 300 MG capsule Take 1 capsule (300 mg total) by mouth 2 (two) times daily. Pt takes two capsules in the morning and three at bedtime., Starting Sat 07/02/2016, No Print    Insulin Glargine (TOUJEO SOLOSTAR) 300 UNIT/ML SOPN Inject 5 Units into the skin daily as needed (for BS greater than 200). , Historical Med     metFORMIN (GLUCOPHAGE) 500 MG tablet Take 500 mg by mouth 2 (two) times daily with Raun Routh meal., Until Discontinued, Historical Med    mometasone-formoterol (DULERA) 200-5 MCG/ACT AERO Inhale 2 puffs into the lungs daily as needed for wheezing or shortness of breath. , Historical Med    nitroGLYCERIN (NITROSTAT) 0.4 MG SL tablet Place 0.4 mg under the tongue every 5 (five) minutes as needed for chest  pain., Historical Med    ondansetron (ZOFRAN ODT) 4 MG disintegrating tablet Take 1 tablet (4 mg total) by mouth every 8 (eight) hours as needed for nausea or vomiting., Starting Tue 05/09/2017, Print    !! Oxycodone HCl 20 MG TABS Take 1 tablet by mouth every 4 (four) hours as needed (pain)., Historical Med    Tamsulosin HCl (FLOMAX) 0.4 MG CAPS Take 0.4 mg by mouth 2 (two) times daily. , Until Discontinued, Historical Med    zolpidem (AMBIEN) 10 MG tablet Take 10 mg by mouth at bedtime as needed for sleep., Historical Med    albuterol (PROVENTIL) (2.5 MG/3ML) 0.083% nebulizer solution Take 2.5 mg by nebulization every 6 (six) hours as needed for wheezing or shortness of breath., Historical Med    aspirin EC 81 MG tablet Take 81 mg by mouth at bedtime., Historical Med    OXYGEN Inhale 6 L into the lungs daily as needed (for breathing)., Historical Med    polyethylene glycol (MIRALAX) packet Take 17 g by mouth daily., Starting Tue 05/09/2017, Print    senna-docusate (SENOKOT-S) 8.6-50 MG tablet Take 1 tablet by mouth at bedtime as needed for mild constipation., Historical Med    traMADol (ULTRAM) 50 MG tablet Take 1 tablet (50 mg total) by mouth every 6 (six) hours as needed., Starting Tue 05/09/2017, Print    triazolam (HALCION) 0.25 MG tablet Take 0.25 mg by mouth at bedtime as needed for sleep., Historical Med     !! - Potential duplicate medications found. Please discuss with provider.    STOP taking these medications     ramipril (ALTACE) 10 MG capsule      naproxen (NAPROSYN) 500 MG tablet         Allergies  Allergen Reactions  . Erythromycin Anaphylaxis and Rash  . Iohexol Anaphylaxis, Hives, Itching and Other (See Comments)    Note:  13 HR PRE-MED GIVEN AND SUCCESSFUL-ARS 07/15/07.     Marland Kitchen Penicillins Anaphylaxis, Itching and Other (See Comments)    Has patient had Lena Gores PCN reaction causing immediate rash, facial/tongue/throat swelling, SOB or lightheadedness with hypotension: Yes Has patient had Katerra Ingman PCN reaction causing severe rash involving mucus membranes or skin necrosis: No Has patient had Khalia Gong PCN reaction that required hospitalization No Has patient had Alysson Geist PCN reaction occurring within the last 10 years: No If all of the above answers are "NO", then may proceed with Cephalosporin use.   Follow-up Information    Lucia Gaskins, MD Follow up.   Specialty:  Internal Medicine Contact information: [place] [address] [address], [place]. Schedule an appointment as soon as possible for [facility] [place] visit.   Specialty:  Neurosurgery Why:  Please call on Monday 9/24 to schedule follow up with neurosurgery.  Tell them that you were in the hospital this week and had abnormal imaging with plans for neurosurgery follow up.   Contact information: [address] [place] [address] [phone]            The results of significant diagnostics from this hospitalization (including imaging, microbiology, ancillary and laboratory) are listed below for reference.    Significant Diagnostic Studies: Ct Abdomen Pelvis Wo Contrast  Result Date: 05/09/2017 CLINICAL DATA:  Left lower quadrant pain with nausea and vomiting EXAM: CT ABDOMEN AND PELVIS WITHOUT CONTRAST TECHNIQUE: Multidetector CT imaging of the abdomen and pelvis was performed following the standard protocol without IV  contrast. COMPARISON:  Radiograph 05/09/2017, CT 09/01/2010 FINDINGS: Lower chest: Lung bases demonstrate dependent atelectasis.  Mild coronary artery calcification. Mitral calcification. Hepatobiliary: No focal liver abnormality is seen. Status post cholecystectomy. No biliary dilatation. Pancreas: Unremarkable. No pancreatic ductal dilatation or surrounding inflammatory changes. Spleen: Normal in size without focal abnormality. Adrenals/Urinary Tract: Adrenal glands appear normal. Status post left nephrectomy. Punctate nonobstructing stone in the mid right kidney. No right hydronephrosis. Subcentimeter cortical hypodensity in the right kidney too small to characterize. The bladder is normal Stomach/Bowel: The stomach is nonenlarged. No dilated small bowel. Transverse lie of the colon. Normal midline appendix. Sigmoid colon diverticular disease without acute inflammation Vascular/Lymphatic: Extensive atherosclerotic calcification of the aorta and iliac vessels. No aneurysmal dilatation. No significantly enlarged abdominal or pelvic lymph nodes. Reproductive: Prostate is unremarkable. Other: Negative for free air or free fluid. Diastasis of the left abdominal musculature with small focal protrusion of intra-abdominal fat. Musculoskeletal: Mild fatty atrophy of the pelvic muscles. Treated compression at L1. Mild superior endplate compression at T11 and T12 unchanged. Schmorl's node inferior endplate of L4. No acute osseous abnormality. IMPRESSION: 1. Status post left nephrectomy. No left lower quadrant inflammatory process is seen 2. Punctate nonobstructing stone in the right kidney 3. Sigmoid colon diverticular disease without acute inflammation Electronically Signed   By: Donavan Foil M.D.   On: 05/09/2017 20:43   Dg Chest 2 View  Result Date: 05/24/2017 CLINICAL DATA:  Chest pain EXAM: CHEST  2 VIEW COMPARISON:  05/09/2017 FINDINGS: Previous median sternotomy. Normal heart size and vascularity. Mild hyperinflation without focal pneumonia, collapse or consolidation. Negative for edema, effusion or pneumothorax. Trachea is midline.  Atherosclerosis of the aorta. Previous upper lumbar vertebral augmentation noted on the lateral view. IMPRESSION: Stable chest exam.  No interval change or acute process Electronically Signed   By: Jerilynn Mages.  Shick M.D.   On: 05/24/2017 17:28   [name] Lumbar Spine Wo Contrast  Result Date: 05/26/2017 CLINICAL DATA:  Radiculopathy.  Severe left-sided stump pain EXAM: MRI LUMBAR SPINE WITHOUT CONTRAST TECHNIQUE: Multiplanar, multisequence [name] imaging of the lumbar spine was performed. No intravenous contrast was administered. COMPARISON:  None. FINDINGS: Segmentation:  Standard Alignment:  No listhesis. Vertebrae: There is Miron Marxen chronic L4 inferior endplate Schmorl's node, seen since at least 2011, although there may have been some progression since that time. Remote L1 compression fracture with cement augmentation. No acute fracture, discitis, or aggressive bone lesion. Conus medullaris: Extends to the L1 level and appears normal. Paraspinal and other soft tissues: No acute finding. Disc levels: Degenerative changes are superimposed on advanced congenital narrowing of the spinal canal due to short pedicles. T12- L1: Shallow central disc protrusion. Facet and ligament hypertrophy. Moderate range spinal stenosis. L1-L2: Disc bulging without degenerative impingement. L2-L3: Degenerative disc narrowing is mild. Circumferential disc bulging with bilateral inferior foraminal effacement. Prominent ligamentum flavum thickening. Severe spinal stenosis. L3-L4: Disc narrowing and bulging. Advanced ligamentum flavum thickening. Severe spinal stenosis. Disc bulging with left extraforaminal protrusion causing bilateral L3 impingement. L4-L5:Disc narrowing and bulging. Facet and ligament hypertrophy. Severe spinal stenosis. Mild bilateral foraminal narrowing mainly from disc bulging. L5-S1:Advanced facet degenerative hypertrophy. Negative disc and patent foramina. No definite S1 impingement. IMPRESSION: 1. Congenitally narrow spinal canal with  superimposed disc bulging and posterior element hypertrophy. Spinal stenosis is severe at L2-3, L3-4, and L4-5. 2. L2-3 bilateral moderate foraminal impingement. 3. L3-4 bilateral severe foraminal impingement, worse on the left where there is far-lateral disc protrusion. 4. Remote L1 compression fracture. L4 inferior endplate  Schmorl's node that is chronic. 5. Motion degraded study. Patient could not complete axial T1 sequence due to pain. Electronically Signed   By: Monte Fantasia M.D.   On: 05/26/2017 12:03   Dg Abdomen Acute W/chest  Result Date: 05/09/2017 CLINICAL DATA:  Left lower quadrant abdominal pain EXAM: DG ABDOMEN ACUTE W/ 1V CHEST COMPARISON:  07/01/2016, 03/31/2017 FINDINGS: Single-view chest demonstrates median sternotomy changes. Linear atelectasis or scarring at the left lung base. Stable cardiomediastinal silhouette with aortic atherosclerosis. Supine and upright views of the abdomen demonstrate no free air beneath the diaphragm. Surgical clips in the upper abdomen. Nonobstructed gas pattern with moderate stool. No abnormal calcification. Vascular calcification. Partially visualized vascular stent in the right groin. Treated compression at L1. IMPRESSION: 1. Linear scarring or atelectasis at the left base 2. Nonobstructed bowel-gas pattern Electronically Signed   By: Donavan Foil M.D.   On: 05/09/2017 19:22   Dg Hip Unilat W Or Wo Pelvis 2-3 Views Left  Result Date: 05/24/2017 CLINICAL DATA:  Radiating left hip pain for 3 weeks. EXAM: DG HIP (WITH OR WITHOUT PELVIS) 2-3V LEFT COMPARISON:  05/24/2017 FINDINGS: Bony pelvis and hips appear symmetric and intact. Negative for fracture or malalignment. Degenerative changes of the lower lumbar spine, SI joints and both hips. Aortic, iliac and femoral atherosclerosis present. Right femoral stent noted. IMPRESSION: No acute osseous finding. Electronically Signed   By: Jerilynn Mages.  Shick M.D.   On: 05/24/2017 17:30   Dg Femur Min 2 Views Left  Result  Date: 05/24/2017 CLINICAL DATA:  Radiating left hip pain, previous left above knee amputation EXAM: LEFT FEMUR 2 VIEWS COMPARISON:  05/24/2017 FINDINGS: Remote left above knee amputation. No other acute osseous finding. Expected postoperative changes. No definite soft tissue abnormality. Peripheral atherosclerosis noted. Degenerative changes of the left hip. IMPRESSION: No acute finding by plain radiography. Previous left above knee amputation. Electronically Signed   By: Jerilynn Mages.  Shick M.D.   On: 05/24/2017 17:31    Microbiology: No results found for this or any previous visit (from the past 240 hour(s)).   Labs: Basic Metabolic Panel:  Recent Labs Lab 05/24/17 1327 05/25/17 1205 05/26/17 1308  NA 131* 130* 133*  K 5.1 4.7 5.0  CL 101 98* 102  CO2 19* 21* 21*  GLUCOSE 103* 123* 111*  BUN 29* 32* 33*  CREATININE 1.45* 1.64* 1.64*  CALCIUM 9.4 9.0 9.2   Liver Function Tests:  Recent Labs Lab 05/24/17 1327 05/25/17 1205  AST 14* 16  ALT 13* 13*  ALKPHOS 51 48  BILITOT 1.1 0.7  PROT 6.9 6.8  ALBUMIN 3.8 3.7   No results for input(s): LIPASE, AMYLASE in the last 168 hours. No results for input(s): AMMONIA in the last 168 hours. CBC:  Recent Labs Lab 05/24/17 1327 05/25/17 1205  WBC 7.3 7.0  NEUTROABS 5.2  --   HGB 15.8 15.3  HCT 45.7 44.9  MCV 89.3 91.3  PLT 223 214   Cardiac Enzymes:  Recent Labs Lab 05/24/17 1904 05/25/17 0009 05/25/17 0859  TROPONINI 0.04* 0.05* 0.05*   BNP: BNP (last 3 results)  Recent Labs  06/16/16 1942 06/29/16 0850  BNP 215.0* 185.0*    ProBNP (last 3 results) No results for input(s): PROBNP in the last 8760 hours.  CBG:  Recent Labs Lab 05/25/17 1725 05/25/17 2224 05/26/17 0750 05/26/17 1240 05/26/17 1642  GLUCAP 123* 129* 121* 141* 100*       Signed:  Fayrene Helper MD.  [facility] 05/26/2017, 8:05 PM

## 2017-05-26 NOTE — Progress Notes (Signed)
PT Cancellation Note  Patient Details Name: [name] MRN: [id number] DOB: October 01, 1940   Cancelled Treatment:    Reason Eval/Treat Not Completed: PT screened, no needs identified, will sign off In to speak with pt and wife. They feel they can manage all transfers and basic ADLs as a team, that the issue is the pain. Pt has all needed DME at home.   [facility] 05/26/2017, 10:17 AM

## 2017-05-29 ENCOUNTER — Ambulatory Visit (INDEPENDENT_AMBULATORY_CARE_PROVIDER_SITE_OTHER): Payer: Medicare Other | Admitting: Surgery

## 2017-05-29 ENCOUNTER — Ambulatory Visit (HOSPITAL_COMMUNITY): Payer: Medicare Other

## 2017-05-29 ENCOUNTER — Encounter: Payer: Self-pay | Admitting: Surgery

## 2017-05-29 ENCOUNTER — Encounter (HOSPITAL_COMMUNITY): Payer: Self-pay

## 2017-05-29 VITALS — BP 110/70 | HR 100 | Temp 97.7°F | Resp 18 | Ht 75.0 in | Wt 197.0 lb

## 2017-05-29 DIAGNOSIS — M5126 Other intervertebral disc displacement, lumbar region: Secondary | ICD-10-CM | POA: Diagnosis not present

## 2017-05-29 DIAGNOSIS — Z89612 Acquired absence of left leg above knee: Secondary | ICD-10-CM

## 2017-05-29 DIAGNOSIS — M5416 Radiculopathy, lumbar region: Secondary | ICD-10-CM | POA: Diagnosis not present

## 2017-05-29 DIAGNOSIS — S78112A Complete traumatic amputation at level between left hip and knee, initial encounter: Secondary | ICD-10-CM

## 2017-05-29 NOTE — Progress Notes (Signed)
[facility] [place]  Patient name: [name] MRN: [id number] DOB: 1941/08/07 Sex: male   REASON FOR VISIT:    Follow up  HISOTRY OF PRESENT ILLNESS:     [name] is a 76 y.o. male, who is well known to me having undergone stenting of his right leg and a left AKA.  He was admitted for severe pain in his stump which he describes as if someone is sticking a hot poker into his leg.  The pain goes all the way up into his hip.  It is worse when it is elevated.  He is on high dose nerve medication as well as narcotics.  HE continues to smoke.  Nothing provides any significant relief   He is back today because of the amount of discomfort he is having  PAST MEDICAL HISTORY:   Past Medical History:  Diagnosis Date  . Anginal pain ([place])   . Arthritis    "qwhere" (07/09/2013)  . Asthma   . COPD (chronic obstructive pulmonary disease) ([place])    pt denies this hx on 07/09/2013  . Coronary artery disease   . Gout   . H/O hiatal hernia 1997  . Heart murmur    "had rheumatic fever as a kid" (07/09/2013)  . High cholesterol   . Hypertension   . Myocardial infarction ([place]) 03/2002; ~ 2011  . Pneumonia    "once" (07/09/2013)  . Renal disorder    "only have 1 kidney; due to go back soon to check the other one" (07/09/2013)  . Rheumatic fever   . RUPTURE ROTATOR CUFF 12/03/2009   Qualifier: Diagnosis of  By: Aline Brochure MD, Dorothyann Peng    . Shortness of breath    "can happen at anytime" (07/09/2013)  . Type II diabetes mellitus ([place])   . 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 Cancer Mother   . Heart attack Father     SOCIAL HISTORY:   Social History  Substance Use Topics  . Smoking status: Current Every Day Smoker    Packs/day: 1.50    Years: 54.00    Types: Cigarettes  . Smokeless tobacco: Never  Used  . Alcohol use No     ALLERGIES:   Allergies  Allergen Reactions  . Erythromycin Anaphylaxis and Rash  . Iohexol Anaphylaxis, Hives, Itching and Other (See Comments)    Note:  13 HR PRE-MED GIVEN AND SUCCESSFUL-ARS 07/15/07.     Marland Kitchen Penicillins Anaphylaxis, Itching and Other (See Comments)    Has patient had a PCN reaction causing immediate rash, facial/tongue/throat swelling, SOB or lightheadedness with hypotension: Yes Has patient had a PCN reaction causing severe rash involving mucus membranes or skin necrosis: No Has patient had a PCN reaction that required hospitalization No Has patient had a PCN reaction occurring within the last 10 years: No If all of the above answers are "NO", then may proceed with Cephalosporin use.     CURRENT MEDICATIONS:   Current Outpatient Prescriptions  Medication Sig Dispense Refill  . albuterol (PROVENTIL HFA;VENTOLIN HFA) 108 (90 Base) MCG/ACT inhaler Inhale 2 puffs into the lungs every 4 (four) hours as needed for wheezing or shortness of breath.    Marland Kitchen albuterol (PROVENTIL) (2.5 MG/3ML) 0.083% nebulizer solution Take 2.5 mg by nebulization every 6 (six) hours as needed for wheezing or shortness of  breath.    Marland Kitchen aspirin EC 81 MG tablet Take 81 mg by mouth at bedtime.    . cholecalciferol (VITAMIN D) 400 units TABS tablet Take 1,200 Units by mouth 2 (two) times daily.    . diclofenac sodium (VOLTAREN) 1 % GEL Apply 2 g topically 4 (four) times daily. 100 g 0  . docusate sodium (COLACE) 100 MG capsule Take 1 capsule (100 mg total) by mouth 2 (two) times daily. 60 capsule 0  . DULoxetine (CYMBALTA) 30 MG capsule Take 1 capsule (30 mg total) by mouth daily. 30 capsule 0  . feeding supplement (BOOST / RESOURCE BREEZE) LIQD Take 1 Container by mouth 3 (three) times daily between meals. 90 Container 0  . gabapentin (NEURONTIN) 300 MG capsule Take 1 capsule (300 mg total) by mouth 2 (two) times daily. Pt takes two capsules in the morning and three at  bedtime. (Patient taking differently: Take 600-900 mg by mouth 2 (two) times daily. Pt takes two capsules in the morning and three at bedtime. )    . Gauze Pads & Dressings (FOAM DRESSING) PADS 1 patch by Does not apply route daily. 30 each 0  . Insulin Glargine (TOUJEO SOLOSTAR) 300 UNIT/ML SOPN Inject 5 Units into the skin daily as needed (for BS greater than 200).     Marland Kitchen lidocaine (LIDODERM) 5 % Place 1 patch onto the skin daily. Remove & Discard patch within 12 hours or as directed by MD 30 patch 0  . metFORMIN (GLUCOPHAGE) 500 MG tablet Take 500 mg by mouth 2 (two) times daily with a meal.    . mometasone-formoterol (DULERA) 200-5 MCG/ACT AERO Inhale 2 puffs into the lungs daily as needed for wheezing or shortness of breath.     . nicotine (NICODERM CQ - DOSED IN MG/24 HOURS) 21 mg/24hr patch Place 1 patch (21 mg total) onto the skin daily. 28 patch 0  . nitroGLYCERIN (NITROSTAT) 0.4 MG SL tablet Place 0.4 mg under the tongue every 5 (five) minutes as needed for chest pain.    Marland Kitchen ondansetron (ZOFRAN ODT) 4 MG disintegrating tablet Take 1 tablet (4 mg total) by mouth every 8 (eight) hours as needed for nausea or vomiting. 20 tablet 0  . Oxycodone HCl 20 MG TABS Take 1 tablet by mouth every 4 (four) hours as needed (pain).    . Oxycodone HCl 20 MG TABS Take 1 tablet (20 mg total) by mouth every 6 (six) hours as needed. 12 tablet 0  . OXYGEN Inhale 6 L into the lungs daily as needed (for breathing).    . polyethylene glycol (MIRALAX) packet Take 17 g by mouth daily. 14 each 0  . senna-docusate (SENOKOT-S) 8.6-50 MG tablet Take 1 tablet by mouth at bedtime as needed for mild constipation.    . Tamsulosin HCl (FLOMAX) 0.4 MG CAPS Take 0.4 mg by mouth 2 (two) times daily.     Marland Kitchen thiamine 100 MG tablet Take 1 tablet (100 mg total) by mouth daily. 30 tablet 0  . traMADol (ULTRAM) 50 MG tablet Take 1 tablet (50 mg total) by mouth every 6 (six) hours as needed. (Patient taking differently: Take 50 mg by  mouth every 6 (six) hours as needed for moderate pain. ) 15 tablet 0  . triazolam (HALCION) 0.25 MG tablet Take 0.25 mg by mouth at bedtime as needed for sleep.    Marland Kitchen zolpidem (AMBIEN) 10 MG tablet Take 10 mg by mouth at bedtime as needed for sleep.  No current facility-administered medications for this visit.     REVIEW OF SYSTEMS:   [X]  denotes positive finding, [ ]  denotes negative finding Cardiac  Comments:  Chest pain or chest pressure:    Shortness of breath upon exertion:    Short of breath when lying flat:    Irregular heart rhythm:        Vascular    Pain in calf, thigh, or hip brought on by ambulation:    Pain in feet at night that wakes you up from your sleep:     Blood clot in your veins:    Leg swelling:         Pulmonary    Oxygen at home:    Productive cough:     Wheezing:         Neurologic    Sudden weakness in arms or legs:     Sudden numbness in arms or legs:     Sudden onset of difficulty speaking or slurred speech:    Temporary loss of vision in one eye:     Problems with dizziness:         Gastrointestinal    Blood in stool:     Vomited blood:         Genitourinary    Burning when urinating:     Blood in urine:        Psychiatric    Major depression:         Hematologic    Bleeding problems:    Problems with blood clotting too easily:        Skin    Rashes or ulcers:        Constitutional    Fever or chills:      PHYSICAL EXAM:   Vitals:   05/29/17 1304  BP: 110/70  Pulse: 100  Resp: 18  Temp: 97.7 F (36.5 C)  TempSrc: Oral  SpO2: 95%  Weight: 197 lb (89.4 kg)  Height: 6\' 3"  (1.905 m)    GENERAL: The patient is a well-nourished male, in no acute distress. The vital signs are documented above. CARDIAC: There is a regular rate and rhythm.  VASCULAR: Palpable femoral pulse PULMONARY: Non-labored respirations ABDOMEN: Soft and non-tender with normal pitched bowel sounds.  MUSCULOSKELETAL: Left above-knee  amputation NEUROLOGIC: No focal weakness or paresthesias are detected. SKIN: There are no ulcers or rashes noted. PSYCHIATRIC: The patient has a normal affect.  STUDIES:   I have reviewed his u/s with the following findings: The distal abdominal aorta appears patent without significant stenosis. Bilateral common and external iliac arteries demonstrate patency with no evidence of hemodynamically significant stenosis. Patency continued into the common femoral artery with chronic occlusive disease noted throughout the left superficial femoral artery.  MEDICAL ISSUES:   I discussed with the patient, that I do not feel there is anything additional from a vascular perspective and I can provide.  He has palpable femoral pulses and his duplex showed the aortoiliac system remains widely patent.  I think he is suffering from nerve pain.  This could either be ischemic neuropathy in nature or it could be secondary to his degenerative back disease.  He is scheduled to undergo injections of his back tomorrow.  I am optimistic that this will provide him with some relief.  He is going to need long-term management by a pain service.    Annamarie Major, MD [facility] [phone] Pager [phone]

## 2017-05-30 DIAGNOSIS — M5126 Other intervertebral disc displacement, lumbar region: Secondary | ICD-10-CM | POA: Diagnosis not present

## 2017-05-30 DIAGNOSIS — M5416 Radiculopathy, lumbar region: Secondary | ICD-10-CM | POA: Diagnosis not present

## 2017-05-31 DIAGNOSIS — J45909 Unspecified asthma, uncomplicated: Secondary | ICD-10-CM | POA: Diagnosis not present

## 2017-05-31 DIAGNOSIS — M2550 Pain in unspecified joint: Secondary | ICD-10-CM | POA: Diagnosis not present

## 2017-05-31 DIAGNOSIS — R5382 Chronic fatigue, unspecified: Secondary | ICD-10-CM | POA: Diagnosis not present

## 2017-05-31 DIAGNOSIS — M545 Low back pain: Secondary | ICD-10-CM | POA: Diagnosis not present

## 2017-06-03 ENCOUNTER — Inpatient Hospital Stay (HOSPITAL_COMMUNITY)
Admission: EM | Admit: 2017-06-03 | Discharge: 2017-06-05 | DRG: 192 | Discharge status: Against Medical Advice | Payer: Medicare Other | Attending: Family Medicine | Admitting: Family Medicine

## 2017-06-03 ENCOUNTER — Emergency Department (HOSPITAL_COMMUNITY): Payer: Medicare Other

## 2017-06-03 ENCOUNTER — Encounter (HOSPITAL_COMMUNITY): Payer: Self-pay | Admitting: *Deleted

## 2017-06-03 DIAGNOSIS — I2510 Atherosclerotic heart disease of native coronary artery without angina pectoris: Secondary | ICD-10-CM | POA: Diagnosis present

## 2017-06-03 DIAGNOSIS — E1122 Type 2 diabetes mellitus with diabetic chronic kidney disease: Secondary | ICD-10-CM | POA: Diagnosis present

## 2017-06-03 DIAGNOSIS — F419 Anxiety disorder, unspecified: Secondary | ICD-10-CM | POA: Diagnosis not present

## 2017-06-03 DIAGNOSIS — Z8249 Family history of ischemic heart disease and other diseases of the circulatory system: Secondary | ICD-10-CM

## 2017-06-03 DIAGNOSIS — R140 Abdominal distension (gaseous): Secondary | ICD-10-CM | POA: Diagnosis not present

## 2017-06-03 DIAGNOSIS — Z89612 Acquired absence of left leg above knee: Secondary | ICD-10-CM | POA: Diagnosis not present

## 2017-06-03 DIAGNOSIS — Z9049 Acquired absence of other specified parts of digestive tract: Secondary | ICD-10-CM

## 2017-06-03 DIAGNOSIS — I10 Essential (primary) hypertension: Secondary | ICD-10-CM | POA: Diagnosis not present

## 2017-06-03 DIAGNOSIS — N183 Chronic kidney disease, stage 3 unspecified: Secondary | ICD-10-CM | POA: Diagnosis present

## 2017-06-03 DIAGNOSIS — Z881 Allergy status to other antibiotic agents status: Secondary | ICD-10-CM | POA: Diagnosis not present

## 2017-06-03 DIAGNOSIS — M109 Gout, unspecified: Secondary | ICD-10-CM | POA: Diagnosis present

## 2017-06-03 DIAGNOSIS — Z809 Family history of malignant neoplasm, unspecified: Secondary | ICD-10-CM

## 2017-06-03 DIAGNOSIS — Z7982 Long term (current) use of aspirin: Secondary | ICD-10-CM | POA: Diagnosis not present

## 2017-06-03 DIAGNOSIS — J441 Chronic obstructive pulmonary disease with (acute) exacerbation: Secondary | ICD-10-CM | POA: Diagnosis not present

## 2017-06-03 DIAGNOSIS — R531 Weakness: Secondary | ICD-10-CM | POA: Diagnosis not present

## 2017-06-03 DIAGNOSIS — Z7983 Long term (current) use of bisphosphonates: Secondary | ICD-10-CM | POA: Diagnosis not present

## 2017-06-03 DIAGNOSIS — R112 Nausea with vomiting, unspecified: Secondary | ICD-10-CM | POA: Diagnosis present

## 2017-06-03 DIAGNOSIS — R05 Cough: Secondary | ICD-10-CM | POA: Diagnosis not present

## 2017-06-03 DIAGNOSIS — Z880 Allergy status to penicillin: Secondary | ICD-10-CM

## 2017-06-03 DIAGNOSIS — K5900 Constipation, unspecified: Secondary | ICD-10-CM | POA: Diagnosis not present

## 2017-06-03 DIAGNOSIS — Z9981 Dependence on supplemental oxygen: Secondary | ICD-10-CM

## 2017-06-03 DIAGNOSIS — F17210 Nicotine dependence, cigarettes, uncomplicated: Secondary | ICD-10-CM | POA: Diagnosis present

## 2017-06-03 DIAGNOSIS — I129 Hypertensive chronic kidney disease with stage 1 through stage 4 chronic kidney disease, or unspecified chronic kidney disease: Secondary | ICD-10-CM | POA: Diagnosis present

## 2017-06-03 DIAGNOSIS — E1151 Type 2 diabetes mellitus with diabetic peripheral angiopathy without gangrene: Secondary | ICD-10-CM | POA: Diagnosis present

## 2017-06-03 DIAGNOSIS — E860 Dehydration: Secondary | ICD-10-CM | POA: Diagnosis not present

## 2017-06-03 DIAGNOSIS — M79605 Pain in left leg: Secondary | ICD-10-CM | POA: Diagnosis not present

## 2017-06-03 DIAGNOSIS — Z91041 Radiographic dye allergy status: Secondary | ICD-10-CM

## 2017-06-03 DIAGNOSIS — Z79891 Long term (current) use of opiate analgesic: Secondary | ICD-10-CM

## 2017-06-03 DIAGNOSIS — Z95828 Presence of other vascular implants and grafts: Secondary | ICD-10-CM | POA: Diagnosis not present

## 2017-06-03 DIAGNOSIS — Z951 Presence of aortocoronary bypass graft: Secondary | ICD-10-CM

## 2017-06-03 DIAGNOSIS — Z7984 Long term (current) use of oral hypoglycemic drugs: Secondary | ICD-10-CM | POA: Diagnosis not present

## 2017-06-03 DIAGNOSIS — Z905 Acquired absence of kidney: Secondary | ICD-10-CM | POA: Diagnosis not present

## 2017-06-03 DIAGNOSIS — I252 Old myocardial infarction: Secondary | ICD-10-CM | POA: Diagnosis not present

## 2017-06-03 DIAGNOSIS — R0682 Tachypnea, not elsewhere classified: Secondary | ICD-10-CM | POA: Diagnosis not present

## 2017-06-03 DIAGNOSIS — R1110 Vomiting, unspecified: Secondary | ICD-10-CM | POA: Insufficient documentation

## 2017-06-03 DIAGNOSIS — I255 Ischemic cardiomyopathy: Secondary | ICD-10-CM | POA: Diagnosis present

## 2017-06-03 DIAGNOSIS — R011 Cardiac murmur, unspecified: Secondary | ICD-10-CM | POA: Diagnosis present

## 2017-06-03 DIAGNOSIS — I482 Chronic atrial fibrillation: Secondary | ICD-10-CM | POA: Diagnosis not present

## 2017-06-03 DIAGNOSIS — Z79899 Other long term (current) drug therapy: Secondary | ICD-10-CM

## 2017-06-03 LAB — URINALYSIS, ROUTINE W REFLEX MICROSCOPIC
BILIRUBIN URINE: NEGATIVE
Bacteria, UA: NONE SEEN
Glucose, UA: NEGATIVE mg/dL
Hgb urine dipstick: NEGATIVE
Ketones, ur: 5 mg/dL — AB
Nitrite: NEGATIVE
Protein, ur: 100 mg/dL — AB
SPECIFIC GRAVITY, URINE: 1.023 (ref 1.005–1.030)
pH: 6 (ref 5.0–8.0)

## 2017-06-03 LAB — CBC WITH DIFFERENTIAL/PLATELET
BASOS ABS: 0 10*3/uL (ref 0.0–0.1)
BASOS PCT: 0 %
Eosinophils Absolute: 0.1 10*3/uL (ref 0.0–0.7)
Eosinophils Relative: 2 %
HEMATOCRIT: 50.4 % (ref 39.0–52.0)
HEMOGLOBIN: 17.3 g/dL — AB (ref 13.0–17.0)
Lymphocytes Relative: 19 %
Lymphs Abs: 1.7 10*3/uL (ref 0.7–4.0)
MCH: 30.5 pg (ref 26.0–34.0)
MCHC: 34.3 g/dL (ref 30.0–36.0)
MCV: 88.9 fL (ref 78.0–100.0)
Monocytes Absolute: 0.9 10*3/uL (ref 0.1–1.0)
Monocytes Relative: 10 %
NEUTROS ABS: 6.2 10*3/uL (ref 1.7–7.7)
NEUTROS PCT: 70 %
Platelets: 252 10*3/uL (ref 150–400)
RBC: 5.67 MIL/uL (ref 4.22–5.81)
RDW: 14 % (ref 11.5–15.5)
WBC: 8.9 10*3/uL (ref 4.0–10.5)

## 2017-06-03 LAB — COMPREHENSIVE METABOLIC PANEL
ALK PHOS: 45 U/L (ref 38–126)
ALT: 16 U/L — ABNORMAL LOW (ref 17–63)
ANION GAP: 14 (ref 5–15)
AST: 16 U/L (ref 15–41)
Albumin: 4.3 g/dL (ref 3.5–5.0)
BILIRUBIN TOTAL: 1.5 mg/dL — AB (ref 0.3–1.2)
BUN: 26 mg/dL — ABNORMAL HIGH (ref 6–20)
CALCIUM: 10 mg/dL (ref 8.9–10.3)
CO2: 18 mmol/L — AB (ref 22–32)
Chloride: 103 mmol/L (ref 101–111)
Creatinine, Ser: 1.46 mg/dL — ABNORMAL HIGH (ref 0.61–1.24)
GFR calc non Af Amer: 46 mL/min — ABNORMAL LOW (ref >60)
GFR, EST AFRICAN AMERICAN: 53 mL/min — AB (ref >60)
Glucose, Bld: 147 mg/dL — ABNORMAL HIGH (ref 65–99)
Potassium: 5 mmol/L (ref 3.5–5.1)
Sodium: 135 mmol/L (ref 135–145)
TOTAL PROTEIN: 7.8 g/dL (ref 6.5–8.1)

## 2017-06-03 LAB — BRAIN NATRIURETIC PEPTIDE: B NATRIURETIC PEPTIDE 5: 76 pg/mL (ref 0.0–100.0)

## 2017-06-03 LAB — I-STAT CG4 LACTIC ACID, ED: Lactic Acid, Venous: 1.7 mmol/L (ref 0.5–1.9)

## 2017-06-03 LAB — TROPONIN I: TROPONIN I: 0.04 ng/mL — AB (ref <0.03)

## 2017-06-03 MED ORDER — LUBIPROSTONE 24 MCG PO CAPS
24.0000 ug | ORAL_CAPSULE | Freq: Two times a day (BID) | ORAL | Status: DC
  Administered 2017-06-04 (×2): 24 ug via ORAL
  Filled 2017-06-03 (×3): qty 1

## 2017-06-03 MED ORDER — ALBUTEROL SULFATE (2.5 MG/3ML) 0.083% IN NEBU: 2.5000 mg | INHALATION_SOLUTION | RESPIRATORY_TRACT | Status: DC | PRN

## 2017-06-03 MED ORDER — SODIUM CHLORIDE 0.9 % IV SOLN
1000.0000 mL | INTRAVENOUS | Status: DC
  Administered 2017-06-03 – 2017-06-05 (×5): 1000 mL via INTRAVENOUS

## 2017-06-03 MED ORDER — SODIUM CHLORIDE 0.9 % IV BOLUS (SEPSIS)
1000.0000 mL | Freq: Once | INTRAVENOUS | Status: AC
Start: 2017-06-03 — End: 2017-06-03
  Administered 2017-06-03: 1000 mL via INTRAVENOUS

## 2017-06-03 MED ORDER — BISACODYL 10 MG RE SUPP
10.0000 mg | Freq: Every day | RECTAL | Status: DC
  Filled 2017-06-03: qty 1

## 2017-06-03 MED ORDER — GABAPENTIN 300 MG PO CAPS
600.0000 mg | ORAL_CAPSULE | Freq: Every day | ORAL | Status: DC
  Administered 2017-06-04: 600 mg via ORAL
  Filled 2017-06-03: qty 2

## 2017-06-03 MED ORDER — VITAMIN B-1 100 MG PO TABS
100.0000 mg | ORAL_TABLET | Freq: Every day | ORAL | Status: DC
  Administered 2017-06-04: 100 mg via ORAL
  Filled 2017-06-03: qty 1

## 2017-06-03 MED ORDER — LORAZEPAM 2 MG/ML IJ SOLN
0.5000 mg | INTRAMUSCULAR | Status: DC | PRN
  Administered 2017-06-03 – 2017-06-05 (×3): 0.5 mg via INTRAVENOUS
  Filled 2017-06-03 (×3): qty 1

## 2017-06-03 MED ORDER — GUAIFENESIN ER 600 MG PO TB12
600.0000 mg | ORAL_TABLET | Freq: Two times a day (BID) | ORAL | Status: DC
  Filled 2017-06-03 (×2): qty 1

## 2017-06-03 MED ORDER — ASPIRIN EC 81 MG PO TBEC
81.0000 mg | DELAYED_RELEASE_TABLET | Freq: Every day | ORAL | Status: DC
  Administered 2017-06-04: 81 mg via ORAL
  Filled 2017-06-03: qty 1

## 2017-06-03 MED ORDER — MOMETASONE FURO-FORMOTEROL FUM 200-5 MCG/ACT IN AERO
2.0000 | INHALATION_SPRAY | Freq: Two times a day (BID) | RESPIRATORY_TRACT | Status: DC
  Administered 2017-06-04 – 2017-06-05 (×3): 2 via RESPIRATORY_TRACT
  Filled 2017-06-03: qty 8.8

## 2017-06-03 MED ORDER — TAMSULOSIN HCL 0.4 MG PO CAPS
0.4000 mg | ORAL_CAPSULE | Freq: Two times a day (BID) | ORAL | Status: DC
  Administered 2017-06-04 (×2): 0.4 mg via ORAL
  Filled 2017-06-03 (×2): qty 1

## 2017-06-03 MED ORDER — TRIAZOLAM 0.25 MG PO TABS: 0.2500 mg | ORAL_TABLET | Freq: Every evening | ORAL | Status: DC | PRN

## 2017-06-03 MED ORDER — HYDROMORPHONE HCL 1 MG/ML IJ SOLN
0.5000 mg | Freq: Once | INTRAMUSCULAR | Status: AC
  Administered 2017-06-03: 0.5 mg via INTRAVENOUS
  Filled 2017-06-03: qty 1

## 2017-06-03 MED ORDER — SENNOSIDES-DOCUSATE SODIUM 8.6-50 MG PO TABS
1.0000 | ORAL_TABLET | Freq: Every evening | ORAL | Status: DC | PRN
Start: 2017-06-03 — End: 2017-06-05

## 2017-06-03 MED ORDER — ACETAMINOPHEN 650 MG RE SUPP: 650.0000 mg | Freq: Four times a day (QID) | RECTAL | Status: DC | PRN

## 2017-06-03 MED ORDER — INSULIN GLARGINE 100 UNIT/ML ~~LOC~~ SOLN
5.0000 [IU] | Freq: Every day | SUBCUTANEOUS | Status: DC
  Administered 2017-06-04: 5 [IU] via SUBCUTANEOUS
  Filled 2017-06-03 (×2): qty 0.05

## 2017-06-03 MED ORDER — TRAMADOL HCL 50 MG PO TABS: 50.0000 mg | ORAL_TABLET | Freq: Four times a day (QID) | ORAL | Status: DC | PRN

## 2017-06-03 MED ORDER — DICLOFENAC SODIUM 1 % TD GEL
2.0000 g | Freq: Four times a day (QID) | TRANSDERMAL | Status: DC
  Administered 2017-06-04 (×3): 2 g via TOPICAL
  Filled 2017-06-03: qty 100

## 2017-06-03 MED ORDER — ALBUTEROL SULFATE (2.5 MG/3ML) 0.083% IN NEBU: 5.0000 mg | INHALATION_SOLUTION | Freq: Once | RESPIRATORY_TRACT | Status: DC

## 2017-06-03 MED ORDER — GABAPENTIN 300 MG PO CAPS: 600.0000 mg | ORAL_CAPSULE | Freq: Two times a day (BID) | ORAL | Status: DC

## 2017-06-03 MED ORDER — ALBUTEROL (5 MG/ML) CONTINUOUS INHALATION SOLN
10.0000 mg/h | INHALATION_SOLUTION | RESPIRATORY_TRACT | Status: AC
  Administered 2017-06-03: 10 mg/h via RESPIRATORY_TRACT
  Filled 2017-06-03: qty 20

## 2017-06-03 MED ORDER — ENOXAPARIN SODIUM 40 MG/0.4ML ~~LOC~~ SOLN
40.0000 mg | SUBCUTANEOUS | Status: DC
  Administered 2017-06-04: 40 mg via SUBCUTANEOUS
  Filled 2017-06-03: qty 0.4

## 2017-06-03 MED ORDER — ZOLPIDEM TARTRATE 5 MG PO TABS
5.0000 mg | ORAL_TABLET | Freq: Every evening | ORAL | Status: DC | PRN
  Administered 2017-06-04: 5 mg via ORAL
  Filled 2017-06-03: qty 1

## 2017-06-03 MED ORDER — INSULIN ASPART 100 UNIT/ML ~~LOC~~ SOLN
0.0000 [IU] | Freq: Every day | SUBCUTANEOUS | Status: DC
  Administered 2017-06-04: 2 [IU] via SUBCUTANEOUS

## 2017-06-03 MED ORDER — ALBUTEROL SULFATE (2.5 MG/3ML) 0.083% IN NEBU
2.5000 mg | INHALATION_SOLUTION | Freq: Four times a day (QID) | RESPIRATORY_TRACT | Status: DC
  Administered 2017-06-04: 2.5 mg via RESPIRATORY_TRACT
  Filled 2017-06-03 (×3): qty 3

## 2017-06-03 MED ORDER — ONDANSETRON HCL 4 MG/2ML IJ SOLN
4.0000 mg | Freq: Once | INTRAMUSCULAR | Status: AC
  Administered 2017-06-03: 4 mg via INTRAVENOUS
  Filled 2017-06-03: qty 2

## 2017-06-03 MED ORDER — HYDROMORPHONE HCL 1 MG/ML IJ SOLN
1.0000 mg | INTRAMUSCULAR | Status: DC | PRN
  Administered 2017-06-04 (×6): 1 mg via INTRAVENOUS
  Filled 2017-06-03 (×6): qty 1

## 2017-06-03 MED ORDER — METOCLOPRAMIDE HCL 5 MG/ML IJ SOLN
10.0000 mg | Freq: Four times a day (QID) | INTRAMUSCULAR | Status: DC
  Administered 2017-06-04 – 2017-06-05 (×5): 10 mg via INTRAVENOUS
  Filled 2017-06-03 (×6): qty 2

## 2017-06-03 MED ORDER — NICOTINE 21 MG/24HR TD PT24
21.0000 mg | MEDICATED_PATCH | Freq: Every day | TRANSDERMAL | Status: DC
  Administered 2017-06-04: 21 mg via TRANSDERMAL
  Filled 2017-06-03: qty 1

## 2017-06-03 MED ORDER — POTASSIUM CHLORIDE IN NACL 40-0.9 MEQ/L-% IV SOLN: INTRAVENOUS | Status: DC

## 2017-06-03 MED ORDER — DULOXETINE HCL 30 MG PO CPEP
30.0000 mg | ORAL_CAPSULE | Freq: Every day | ORAL | Status: DC
  Administered 2017-06-04: 30 mg via ORAL
  Filled 2017-06-03: qty 1

## 2017-06-03 MED ORDER — OXYCODONE HCL 5 MG PO TABS
20.0000 mg | ORAL_TABLET | ORAL | Status: DC | PRN
  Administered 2017-06-04 (×2): 20 mg via ORAL
  Filled 2017-06-03 (×2): qty 4

## 2017-06-03 MED ORDER — GABAPENTIN 300 MG PO CAPS
900.0000 mg | ORAL_CAPSULE | Freq: Every day | ORAL | Status: DC
  Administered 2017-06-04: 900 mg via ORAL
  Filled 2017-06-03: qty 3

## 2017-06-03 MED ORDER — INSULIN ASPART 100 UNIT/ML ~~LOC~~ SOLN
0.0000 [IU] | Freq: Three times a day (TID) | SUBCUTANEOUS | Status: DC
  Administered 2017-06-04 (×3): 3 [IU] via SUBCUTANEOUS
  Administered 2017-06-05: 5 [IU] via SUBCUTANEOUS

## 2017-06-03 MED ORDER — METHYLPREDNISOLONE SODIUM SUCC 125 MG IJ SOLR
60.0000 mg | Freq: Two times a day (BID) | INTRAMUSCULAR | Status: DC
  Administered 2017-06-04: 60 mg via INTRAVENOUS
  Filled 2017-06-03: qty 2

## 2017-06-03 MED ORDER — ACETAMINOPHEN 325 MG PO TABS: 650.0000 mg | ORAL_TABLET | Freq: Four times a day (QID) | ORAL | Status: DC | PRN

## 2017-06-03 MED ORDER — PROMETHAZINE HCL 12.5 MG PO TABS: 12.5000 mg | ORAL_TABLET | Freq: Four times a day (QID) | ORAL | Status: DC | PRN

## 2017-06-03 MED ORDER — DOCUSATE SODIUM 100 MG PO CAPS
100.0000 mg | ORAL_CAPSULE | Freq: Two times a day (BID) | ORAL | Status: DC
  Administered 2017-06-04 (×2): 100 mg via ORAL
  Filled 2017-06-03 (×2): qty 1

## 2017-06-03 MED ORDER — TIOTROPIUM BROMIDE MONOHYDRATE 18 MCG IN CAPS
18.0000 ug | ORAL_CAPSULE | Freq: Every day | RESPIRATORY_TRACT | Status: DC
  Administered 2017-06-04 – 2017-06-05 (×2): 18 ug via RESPIRATORY_TRACT
  Filled 2017-06-03: qty 5

## 2017-06-03 MED ORDER — METOCLOPRAMIDE HCL 5 MG/ML IJ SOLN
10.0000 mg | Freq: Once | INTRAMUSCULAR | Status: AC
  Administered 2017-06-03: 10 mg via INTRAVENOUS
  Filled 2017-06-03: qty 2

## 2017-06-03 MED ORDER — LIDOCAINE 5 % EX PTCH
1.0000 | MEDICATED_PATCH | CUTANEOUS | Status: DC
  Administered 2017-06-04: 1 via TRANSDERMAL
  Filled 2017-06-03 (×4): qty 1

## 2017-06-03 MED ORDER — METHYLPREDNISOLONE SODIUM SUCC 125 MG IJ SOLR
125.0000 mg | Freq: Once | INTRAMUSCULAR | Status: AC
  Administered 2017-06-03: 125 mg via INTRAVENOUS
  Filled 2017-06-03: qty 2

## 2017-06-03 MED ORDER — METHYLPREDNISOLONE SODIUM SUCC 125 MG IJ SOLR: 125.0000 mg | Freq: Once | INTRAMUSCULAR | Status: DC

## 2017-06-03 MED ORDER — LORAZEPAM 2 MG/ML IJ SOLN
1.0000 mg | Freq: Once | INTRAMUSCULAR | Status: AC
Start: 2017-06-03 — End: 2017-06-03
  Administered 2017-06-03: 1 mg via INTRAVENOUS
  Filled 2017-06-03: qty 1

## 2017-06-03 NOTE — ED Notes (Signed)
X-ray at bedside

## 2017-06-03 NOTE — H&P (Signed)
History and Physical  DAVISON OHMS VOZ:[id number] DOB: 28-Jan-1941 DOA: 06/03/2017  Referring physician: Dr Sabra Heck, ED physician PCP: Lucia Gaskins, MD  Outpatient Specialists:   Patient Coming From: Home  Chief Complaint: severe stump pain, shortness of breath, vomiting  HPI: [name] is a 76 y.o. male with a history of coronary artery disease status post CABG 6-7 years ago, insulin-dependent diabetes, COPD, history of left nephrectomy, severe peripheral artery disease status post left AKA in 2011. Patient was recently hospitalized from 9/19 until 9/21 with similar complaints. Workup was done which was relatively negative. Patient is still intolerant of oral food and liquids and is short of breath. He does have some wheezing and has an oxygen requirement. Nebulizer given in the emergency department helped with his symptoms. Increased cough. No sputum production. Has been drinking fluids, but has had nothing solid. Emesis with oral foods. Emesis of stomach contents. Patient has not had a stool since he was hospitalized.  Emergency Department Course: Patient received steroids, nebulizer treatment, pain medicine, Ativan. Chest x-ray shows no acute infiltrate.  Review of Systems:   Pt complains of leg pain, stomach pain, abdominal pain, vomiting, constipation.  Pt denies any fevers, chills, diarrhea, palpitations, headache, vision changes, lightheadedness, dizziness, melena, rectal bleeding.  Review of systems are otherwise negative  Past Medical History:  Diagnosis Date  . Anginal pain ([place])   . Arthritis    "qwhere" (07/09/2013)  . Asthma   . COPD (chronic obstructive pulmonary disease) ([place])    pt denies this hx on 07/09/2013  . Coronary artery disease   . Gout   . H/O hiatal hernia 1997  . Heart murmur    "had rheumatic fever as a kid" (07/09/2013)  . High cholesterol   . Hypertension   . Myocardial infarction ([place]) 03/2002; ~ 2011  . Pneumonia    "once" (07/09/2013)    . Renal disorder    "only have 1 kidney; due to go back soon to check the other one" (07/09/2013)  . Rheumatic fever   . RUPTURE ROTATOR CUFF 12/03/2009   Qualifier: Diagnosis of  By: Aline Brochure MD, Dorothyann Peng    . Shortness of breath    "can happen at anytime" (07/09/2013)  . Type II diabetes mellitus ([place])   . Ureter cancer [facility]) 12/2001   "shut down my kidney & resulted in nephrectomy" (07/09/2013)   Past Surgical History:  Procedure Laterality Date  . ABOVE KNEE LEG AMPUTATION Left 09/2009  . BELOW KNEE LEG AMPUTATION Left 08/2009  . CARDIAC CATHETERIZATION  ~ 2010   "before the bypass" (07/09/2013)  . CARPAL TUNNEL RELEASE Bilateral ~2004; ~ 2011  . CHOLECYSTECTOMY  1980's  . CORONARY ARTERY BYPASS GRAFT  ~ 2010   "CABG X5" (07/09/2013)  . EYE SURGERY    . FEMORAL ARTERY STENT Right 07/09/2013  . GLAUCOMA SURGERY Bilateral ?[id number]  . LOWER EXTREMITY ANGIOGRAM Right Nov. 4, 2014  . LOWER EXTREMITY ANGIOGRAM Right 07/09/2013   Procedure: LOWER EXTREMITY ANGIOGRAM;  Surgeon: Serafina Mitchell, MD;  Location: [facility] CATH LAB;  Service: Cardiovascular;  Laterality: Right;  . NEPHRECTOMY Left 12/19/2001  . PERIPHERAL VASCULAR CATHETERIZATION N/A 02/03/2015   Procedure: Abdominal Aortogram;  Surgeon: Serafina Mitchell, MD;  Location: [facility] CV LAB;  Service: Cardiovascular;  Laterality: N/A;  . TRIGGER FINGER RELEASE Left ~ 1990's   Social History:  reports that he has been smoking Cigarettes.  He has a 81.00 pack-year smoking history. He has never used smokeless tobacco. He  reports that he does not drink alcohol or use drugs. Patient lives at [place]  . Erythromycin Anaphylaxis and Rash  . Iohexol Anaphylaxis, Hives, Itching and Other (See Comments)    Note:  13 HR PRE-MED GIVEN AND SUCCESSFUL-ARS 07/15/07.     Marland Kitchen Penicillins Anaphylaxis, Itching and Other (See Comments)    Has patient had a PCN reaction causing immediate rash, facial/tongue/throat swelling, SOB  or lightheadedness with hypotension: Yes Has patient had a PCN reaction causing severe rash involving mucus membranes or skin necrosis: No Has patient had a PCN reaction that required hospitalization No Has patient had a PCN reaction occurring within the last 10 years: No If all of the above answers are "NO", then may proceed with Cephalosporin use.    Family History  Problem Relation Age of Onset  . Cancer Mother   . Heart attack Father       Prior to Admission medications   Medication Sig Start Date End Date Taking? Authorizing Provider  albuterol (PROVENTIL HFA;VENTOLIN HFA) 108 (90 Base) MCG/ACT inhaler Inhale 2 puffs into the lungs every 4 (four) hours as needed for wheezing or shortness of breath.    [provider]  albuterol (PROVENTIL) (2.5 MG/3ML) 0.083% nebulizer solution Take 2.5 mg by nebulization every 6 (six) hours as needed for wheezing or shortness of breath.    [provider]  aspirin EC 81 MG tablet Take 81 mg by mouth at bedtime.    [provider]  cholecalciferol (VITAMIN D) 400 units TABS tablet Take 1,200 Units by mouth 2 (two) times daily.    [provider]  diclofenac sodium (VOLTAREN) 1 % GEL Apply 2 g topically 4 (four) times daily. 05/26/17   Elodia Florence., MD  docusate sodium (COLACE) 100 MG capsule Take 1 capsule (100 mg total) by mouth 2 (two) times daily. 05/26/17 06/25/17  Elodia Florence., MD  DULoxetine (CYMBALTA) 30 MG capsule Take 1 capsule (30 mg total) by mouth daily. 05/27/17 06/26/17  Elodia Florence., MD  feeding supplement (BOOST / RESOURCE BREEZE) LIQD Take 1 Container by mouth 3 (three) times daily between meals. 05/26/17 06/25/17  Elodia Florence., MD  gabapentin (NEURONTIN) 300 MG capsule Take 1 capsule (300 mg total) by mouth 2 (two) times daily. Pt takes two capsules in the morning and three at bedtime. Patient taking differently: Take 600-900 mg by mouth 2 (two) times daily. Pt  takes two capsules in the morning and three at bedtime.  07/02/16   Verlee Monte, MD  Gauze Pads & Dressings (FOAM DRESSING) PADS 1 patch by Does not apply route daily. 05/26/17   Elodia Florence., MD  Insulin Glargine (TOUJEO SOLOSTAR) 300 UNIT/ML SOPN Inject 5 Units into the skin daily as needed (for BS greater than 200).     [provider]  lidocaine (LIDODERM) 5 % Place 1 patch onto the skin daily. Remove & Discard patch within 12 hours or as directed by MD 05/26/17   Elodia Florence., MD  metFORMIN (GLUCOPHAGE) 500 MG tablet Take 500 mg by mouth 2 (two) times daily with a meal.    [provider]  mometasone-formoterol (DULERA) 200-5 MCG/ACT AERO Inhale 2 puffs into the lungs daily as needed for wheezing or shortness of breath.     [provider]  nicotine (NICODERM CQ - DOSED IN MG/24 HOURS) 21 mg/24hr patch Place 1 patch (21 mg total) onto the skin  daily. 05/27/17   Elodia Florence., MD  nitroGLYCERIN (NITROSTAT) 0.4 MG SL tablet Place 0.4 mg under the tongue every 5 (five) minutes as needed for chest pain.    [provider]  ondansetron (ZOFRAN ODT) 4 MG disintegrating tablet Take 1 tablet (4 mg total) by mouth every 8 (eight) hours as needed for nausea or vomiting. 05/09/17   Long, Wonda Olds, MD  Oxycodone HCl 20 MG TABS Take 1 tablet by mouth every 4 (four) hours as needed (pain).    [provider]  Oxycodone HCl 20 MG TABS Take 1 tablet (20 mg total) by mouth every 6 (six) hours as needed. 05/26/17   Elodia Florence., MD  OXYGEN Inhale 6 L into the lungs daily as needed (for breathing).    [provider]  polyethylene glycol (MIRALAX) packet Take 17 g by mouth daily. 05/09/17   Long, Wonda Olds, MD  senna-docusate (SENOKOT-S) 8.6-50 MG tablet Take 1 tablet by mouth at bedtime as needed for mild constipation.    [provider]  Tamsulosin HCl (FLOMAX) 0.4 MG CAPS Take 0.4 mg by mouth 2 (two) times daily.      [provider]  thiamine 100 MG tablet Take 1 tablet (100 mg total) by mouth daily. 05/27/17 06/26/17  Elodia Florence., MD  traMADol (ULTRAM) 50 MG tablet Take 1 tablet (50 mg total) by mouth every 6 (six) hours as needed. Patient taking differently: Take 50 mg by mouth every 6 (six) hours as needed for moderate pain.  05/09/17   Long, Wonda Olds, MD  triazolam (HALCION) 0.25 MG tablet Take 0.25 mg by mouth at bedtime as needed for sleep.    [provider]  zolpidem (AMBIEN) 10 MG tablet Take 10 mg by mouth at bedtime as needed for sleep.    [provider]    Physical Exam: BP (!) 161/93 (BP Location: Right Arm)   Pulse (!) 106   Temp 97.6 F (36.4 C) (Oral)   Resp (!) 121   Ht 6\' 3"  (1.905 m)   Wt 89.4 kg (197 lb)   SpO2 97%   BMI 24.62 kg/m   General: Elderly Caucasian male. Awake and alert and oriented x3. No acute cardiopulmonary distress.  HEENT: Normocephalic atraumatic.  Right and left ears normal in appearance.  Pupils equal, round, reactive to light. Extraocular muscles are intact. Sclerae anicteric and noninjected.  Moist mucosal membranes. No mucosal lesions.  Neck: Neck supple without lymphadenopathy. No carotid bruits. No masses palpated.  Cardiovascular: Regular tachycardic rate. Normal S1-S2 sounds. No murmurs, rubs, gallops auscultated. No JVD.  Respiratory: Mild wheezing, poor respiratory effort. No rales. No accessory muscle use. Abdomen: Soft, tender in the left side where his abdominal hernia is. No rebound or guarding. Nondistended. Active bowel sounds. No masses or hepatosplenomegaly  Skin: No rashes, lesions, or ulcerations.  Dry, warm to touch. Right leg is cool to touch, although dorsalis pedis pulse present by Doppler.  Musculoskeletal: No calf or leg pain. All major joints not erythematous nontender.  No upper or lower joint deformation.  Good ROM.  No contractures  Psychiatric: Intact judgment and insight. Pleasant and  cooperative. Neurologic: No focal neurological deficits. Strength is 5/5 and symmetric in upper and lower extremities.  Cranial nerves II through XII are grossly intact.           Labs on Admission: I have personally reviewed following labs and imaging studies  CBC:  Recent Labs Lab  06/03/17 2000  WBC 8.9  NEUTROABS 6.2  HGB 17.3*  HCT 50.4  MCV 88.9  PLT 169   Basic Metabolic Panel:  Recent Labs Lab 06/03/17 2000  NA 135  K 5.0  CL 103  CO2 18*  GLUCOSE 147*  BUN 26*  CREATININE 1.46*  CALCIUM 10.0   GFR: Estimated Creatinine Clearance: 53.1 mL/min (A) (by C-G formula based on SCr of 1.46 mg/dL (H)). Liver Function Tests:  Recent Labs Lab 06/03/17 2000  AST 16  ALT 16*  ALKPHOS 45  BILITOT 1.5*  PROT 7.8  ALBUMIN 4.3   No results for input(s): LIPASE, AMYLASE in the last 168 hours. No results for input(s): AMMONIA in the last 168 hours. Coagulation Profile: No results for input(s): INR, PROTIME in the last 168 hours. Cardiac Enzymes:  Recent Labs Lab 06/03/17 2000  TROPONINI 0.04*   BNP (last 3 results) No results for input(s): PROBNP in the last 8760 hours. HbA1C: No results for input(s): HGBA1C in the last 72 hours. CBG: No results for input(s): GLUCAP in the last 168 hours. Lipid Profile: No results for input(s): CHOL, HDL, LDLCALC, TRIG, CHOLHDL, LDLDIRECT in the last 72 hours. Thyroid Function Tests: No results for input(s): TSH, T4TOTAL, FREET4, T3FREE, THYROIDAB in the last 72 hours. Anemia Panel: No results for input(s): VITAMINB12, FOLATE, FERRITIN, TIBC, IRON, RETICCTPCT in the last 72 hours. Urine analysis:    Component Value Date/Time   COLORURINE YELLOW 05/24/2017 [place] 05/24/2017 1327   LABSPEC 1.019 05/24/2017 1327   PHURINE 6.0 05/24/2017 1327   GLUCOSEU NEGATIVE 05/24/2017 1327   HGBUR NEGATIVE 05/24/2017 1327   BILIRUBINUR NEGATIVE 05/24/2017 1327   KETONESUR 20 (A) 05/24/2017 1327   PROTEINUR 30  (A) 05/24/2017 1327   UROBILINOGEN 0.2 09/10/2014 1715   NITRITE NEGATIVE 05/24/2017 1327   LEUKOCYTESUR NEGATIVE 05/24/2017 1327   Sepsis Labs: @LABRCNTIP (procalcitonin:4,lacticidven:4) )No results found for this or any previous visit (from the past 240 hour(s)).   Radiological Exams on Admission: Dg Chest Port 1 View  Result Date: 06/03/2017 CLINICAL DATA:  Cough and dyspnea EXAM: PORTABLE CHEST 1 VIEW COMPARISON:  05/24/2017 chest radiograph. FINDINGS: Intact sternotomy wires. CABG clips overlie the mediastinum. Stable cardiomediastinal silhouette with normal heart size and aortic atherosclerosis. No pneumothorax. No pleural effusion. Lungs appear clear, with no acute consolidative airspace disease and no pulmonary edema. IMPRESSION: No active cardiopulmonary disease. Electronically Signed   By: Ilona Sorrel M.D.   On: 06/03/2017 20:44    EKG: Independently reviewed. Sinus tachycardia. Left atrial enlargement. Old anterior MI. Prolonged PR. No acute ST changes. Unchanged from prior although faster rate.  Assessment/Plan: Active Problems:   Leg pain, left   Dehydration   Hypertension   Diabetes mellitus with peripheral vascular disease (HCC)   COPD exacerbation (HCC)   CKD (chronic kidney disease), stage III   Intractable vomiting   Intractable nausea and vomiting    This patient was discussed with the ED physician, including pertinent vitals, physical exam findings, labs, and imaging.  We also discussed care given by the ED provider.  #1 intractable nausea and vomiting  Admit to stepdown  Will follow abdominal x-rays - if suggestive of ileus or small bowel obstruction, would consider CT of abdomen and pelvis with oral contrast  We'll give Phenergan for nausea and vomiting  Reglan scheduled  We'll treat underlying constipation  Clear liquid diet #2 COPD exacerbation  Start steroids  Nebulizer treatments  Mucinex  No antibiotics necessary at this  time.  Ruthe Mannan  Spiriva #3 dehydration  Start IV fluids #4 constipation  We'll start the patient on Amitiza as the patient is on large doses of narcotics and may have opioid-induced constipation. #5 left leg pain  Continue opiates, Cymbalta, gabapentin  Dilaudid for breakthrough pain. #6 hypertension  Continue antihypertensives #7 diabetes  Continue metformin  Sliding scale insulin every 6 hours with CBGs every 6 hours #8 chronic kidney disease stage III (single kidney)  DVT prophylaxis: Lovenox Consultants: None Code Status: Full code confirmed Family Communication: Wife  Disposition Plan: Patient to return home following admission   Jacob Moores [facility] Pager [phone]  If 7PM-7AM, please contact night-coverage [email or web address] Password [email or web address]

## 2017-06-03 NOTE — ED Triage Notes (Signed)
Pt c/o n/v for the past two weeks, was seen at [facility] two weeks ago and admitted to hospital, contacted ems tonight for n/v that pt became sob en route with ems, pt was given 4 mg Zofran enroute by ems

## 2017-06-03 NOTE — ED Provider Notes (Signed)
[facility] DEPT Provider Note   CSN: [id number] Arrival date & time: 06/03/17  1946     History   Chief Complaint Chief Complaint  Patient presents with  . Shortness of Breath  . Emesis    HPI [name] is a 76 y.o. male.  HPI   The patient is a 76 year old male who has a medical history significant for chronic obstructive pulmonary disease, coronary disease status post bypass grafting as well as an above-the-knee amputation on the left who has had ongoing pain to the stump for quite some time. Of note the patient was recently admitted and treated for the pain in his leg, this was evaluated with arterial as well as venous function without any signs of DVT or significant arterial occlusion. He was also evaluated with an MRI of his back to make sure there was no signs of neural impingement. There was some nerve impingement but nothing that was ultimately surgical.  The patient presents today stating that he is having ongoing pain in his leg on the left, no worse than before, he reports that he has had no oral intake in 2 weeks. Interestingly his prior admission one week ago said nothing of nausea or vomiting and he did not express that complaint. He also complains of increasing shortness of breath. The paramedics found the patient to be tachypneic tachycardic but not significantly hypoxic. The patient states that his nausea and vomiting is been persistent for the last 2 weeks, he is unable to take even small amounts of food without vomiting per his report. Decreases in urinary frequency and stooling correlate with that. He does have chronic shortness of breath but feels like it is worse. He has had the occasional coughing but feels like mostly it is dry and nonproductive. He denies fevers but has had increased amounts of diaphoresis.   Past Medical History:  Diagnosis Date  . Anginal pain ([place])   . Arthritis    "qwhere" (07/09/2013)  . Asthma   . COPD (chronic obstructive  pulmonary disease) ([place])    pt denies this hx on 07/09/2013  . Coronary artery disease   . Gout   . H/O hiatal hernia 1997  . Heart murmur    "had rheumatic fever as a kid" (07/09/2013)  . High cholesterol   . Hypertension   . Myocardial infarction ([place]) 03/2002; ~ 2011  . Pneumonia    "once" (07/09/2013)  . Renal disorder    "only have 1 kidney; due to go back soon to check the other one" (07/09/2013)  . Rheumatic fever   . RUPTURE ROTATOR CUFF 12/03/2009   Qualifier: Diagnosis of  By: Aline Brochure MD, Dorothyann Peng    . Shortness of breath    "can happen at anytime" (07/09/2013)  . Type II diabetes mellitus ([place])   . 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 Intractable vomiting 06/03/2017  . Pressure injury of skin 05/25/2017  . PAD (peripheral artery disease) ([place]) 05/24/2017  . CKD (chronic kidney disease), stage III 06/29/2016  . COPD exacerbation ([place]) 06/16/2016  . Hyperkalemia 11/24/2015  . UTI (lower urinary tract infection) 11/24/2015  . Acute UTI 09/11/2014  . Acute respiratory failure with hypercapnia ([place]) 09/10/2014  . Acute encephalopathy 09/10/2014  . Alcoholic dementia ([place]) 68/08/7516  . Recurrent falls 09/10/2014  . Left shoulder pain 09/10/2014  . CAD in native artery 09/10/2014  .  Diabetes mellitus with peripheral vascular disease ([place]) 09/10/2014  . FTT (failure to thrive) in adult 09/10/2014  . Protein-calorie malnutrition ([place]) 09/10/2014  . Mixed acid base balance disorder 09/09/2014  . Acute kidney injury ([place]) 09/09/2014  . Hypotension 09/09/2014  . Fall 08/27/2014  . Altered mental status 08/26/2014  . Dehydration 08/26/2014  . Alcohol abuse 08/26/2014  . Hypertension 08/26/2014  . Aftercare following surgery of the circulatory system, [place] 08/05/2013  . Leg pain, left 07/01/2013  . Peripheral vascular disease ([place]) 05/27/2013  . Peripheral  angiopathy in diseases classified elsewhere ([place]) 05/02/2012  . Diabetes ([place]) 05/02/2012  . Pain in limb 05/02/2012  . Chronic total occlusion of artery of the extremities ([place]) 05/02/2012  . JOINT EFFUSION, KNEE 12/03/2009  . RUPTURE ROTATOR CUFF 12/03/2009  . ISCHEMIC CARDIOMYOPATHY 08/24/2009  . CHF 08/24/2009  . BACK PAIN, LUMBAR, CHRONIC 08/24/2009    Past Surgical History:  Procedure Laterality Date  . ABOVE KNEE LEG AMPUTATION Left 09/2009  . BELOW KNEE LEG AMPUTATION Left 08/2009  . CARDIAC CATHETERIZATION  ~ 2010   "before the bypass" (07/09/2013)  . CARPAL TUNNEL RELEASE Bilateral ~2004; ~ 2011  . CHOLECYSTECTOMY  1980's  . CORONARY ARTERY BYPASS GRAFT  ~ 2010   "CABG X5" (07/09/2013)  . EYE SURGERY    . FEMORAL ARTERY STENT Right 07/09/2013  . GLAUCOMA SURGERY Bilateral ?[id number]'G  . LOWER EXTREMITY ANGIOGRAM Right Nov. 4, 2014  . LOWER EXTREMITY ANGIOGRAM Right 07/09/2013   Procedure: LOWER EXTREMITY ANGIOGRAM;  Surgeon: Serafina Mitchell, MD;  Location: [facility] CATH LAB;  Service: Cardiovascular;  Laterality: Right;  . NEPHRECTOMY Left 12/19/2001  . PERIPHERAL VASCULAR CATHETERIZATION N/A 02/03/2015   Procedure: Abdominal Aortogram;  Surgeon: Serafina Mitchell, MD;  Location: [facility] CV LAB;  Service: Cardiovascular;  Laterality: N/A;  . TRIGGER FINGER RELEASE Left ~ 1990's       Home Medications    Prior to Admission medications   Medication Sig Start Date End Date Taking? Authorizing Provider  albuterol (PROVENTIL HFA;VENTOLIN HFA) 108 (90 Base) MCG/ACT inhaler Inhale 2 puffs into the lungs every 4 (four) hours as needed for wheezing or shortness of breath.    [provider]  albuterol (PROVENTIL) (2.5 MG/3ML) 0.083% nebulizer solution Take 2.5 mg by nebulization every 6 (six) hours as needed for wheezing or shortness of breath.    [provider]  aspirin EC 81 MG tablet Take 81 mg by mouth at bedtime.    [provider]  cholecalciferol  (VITAMIN D) 400 units TABS tablet Take 1,200 Units by mouth 2 (two) times daily.    [provider]  diclofenac sodium (VOLTAREN) 1 % GEL Apply 2 g topically 4 (four) times daily. 05/26/17   Elodia Florence., MD  docusate sodium (COLACE) 100 MG capsule Take 1 capsule (100 mg total) by mouth 2 (two) times daily. 05/26/17 06/25/17  Elodia Florence., MD  DULoxetine (CYMBALTA) 30 MG capsule Take 1 capsule (30 mg total) by mouth daily. 05/27/17 06/26/17  Elodia Florence., MD  feeding supplement (BOOST / RESOURCE BREEZE) LIQD Take 1 Container by mouth 3 (three) times daily between meals. 05/26/17 06/25/17  Elodia Florence., MD  gabapentin (NEURONTIN) 300 MG capsule Take 1 capsule (300 mg total) by mouth 2 (two) times daily. Pt takes two capsules in the morning and three at bedtime. Patient taking differently: Take 600-900 mg by mouth 2 (two) times daily. Pt takes two capsules in the  morning and three at bedtime.  07/02/16   Verlee Monte, MD  Gauze Pads & Dressings (FOAM DRESSING) PADS 1 patch by Does not apply route daily. 05/26/17   Elodia Florence., MD  Insulin Glargine (TOUJEO SOLOSTAR) 300 UNIT/ML SOPN Inject 5 Units into the skin daily as needed (for BS greater than 200).     [provider]  lidocaine (LIDODERM) 5 % Place 1 patch onto the skin daily. Remove & Discard patch within 12 hours or as directed by MD 05/26/17   Elodia Florence., MD  metFORMIN (GLUCOPHAGE) 500 MG tablet Take 500 mg by mouth 2 (two) times daily with a meal.    [provider]  mometasone-formoterol (DULERA) 200-5 MCG/ACT AERO Inhale 2 puffs into the lungs daily as needed for wheezing or shortness of breath.     [provider]  nicotine (NICODERM CQ - DOSED IN MG/24 HOURS) 21 mg/24hr patch Place 1 patch (21 mg total) onto the skin daily. 05/27/17   Elodia Florence., MD  nitroGLYCERIN (NITROSTAT) 0.4 MG SL tablet Place 0.4 mg under the tongue every 5 (five)  minutes as needed for chest pain.    [provider]  ondansetron (ZOFRAN ODT) 4 MG disintegrating tablet Take 1 tablet (4 mg total) by mouth every 8 (eight) hours as needed for nausea or vomiting. 05/09/17   Long, Wonda Olds, MD  Oxycodone HCl 20 MG TABS Take 1 tablet by mouth every 4 (four) hours as needed (pain).    [provider]  Oxycodone HCl 20 MG TABS Take 1 tablet (20 mg total) by mouth every 6 (six) hours as needed. 05/26/17   Elodia Florence., MD  OXYGEN Inhale 6 L into the lungs daily as needed (for breathing).    [provider]  polyethylene glycol (MIRALAX) packet Take 17 g by mouth daily. 05/09/17   Long, Wonda Olds, MD  senna-docusate (SENOKOT-S) 8.6-50 MG tablet Take 1 tablet by mouth at bedtime as needed for mild constipation.    [provider]  Tamsulosin HCl (FLOMAX) 0.4 MG CAPS Take 0.4 mg by mouth 2 (two) times daily.     [provider]  thiamine 100 MG tablet Take 1 tablet (100 mg total) by mouth daily. 05/27/17 06/26/17  Elodia Florence., MD  traMADol (ULTRAM) 50 MG tablet Take 1 tablet (50 mg total) by mouth every 6 (six) hours as needed. Patient taking differently: Take 50 mg by mouth every 6 (six) hours as needed for moderate pain.  05/09/17   Long, Wonda Olds, MD  triazolam (HALCION) 0.25 MG tablet Take 0.25 mg by mouth at bedtime as needed for sleep.    [provider]  zolpidem (AMBIEN) 10 MG tablet Take 10 mg by mouth at bedtime as needed for sleep.    [provider]    Family History Family History  Problem Relation Age of Onset  . Cancer Mother   . Heart attack Father     Social History Social History  Substance Use Topics  . Smoking status: Current Every Day Smoker    Packs/day: 1.50    Years: 54.00    Types: Cigarettes  . Smokeless tobacco: Never Used  . Alcohol use No     Allergies   Erythromycin; Iohexol; and Penicillins   Review of Systems Review of Systems  All other  systems reviewed and are negative.    Physical Exam Updated Vital Signs BP (!) 158/127  Pulse (!) 126   Temp 97.6 F (36.4 C) (Oral)   Resp 18   Ht 6\' 3"  (1.905 m)   Wt 89.4 kg (197 lb)   SpO2 100%   BMI 24.62 kg/m   Physical Exam  Constitutional: He appears well-developed and well-nourished. He appears distressed.  HENT:  Head: Normocephalic and atraumatic.  Mouth/Throat: Oropharynx is clear and moist. No oropharyngeal exudate.  Eyes: Pupils are equal, round, and reactive to light. Conjunctivae and EOM are normal. Right eye exhibits no discharge. Left eye exhibits no discharge. No scleral icterus.  Neck: Normal range of motion. Neck supple. No JVD present. No thyromegaly present.  Cardiovascular: Regular rhythm, normal heart sounds and intact distal pulses.  Exam reveals no gallop and no friction rub.   No murmur heard. Tachycardia  Pulmonary/Chest: He is in respiratory distress. He has wheezes. He has no rales.  Increased work of breathing, speaks in shortened sentences, tachypneic with a prolonged expiratory phase and mild accessory muscle use.  Abdominal: Soft. Bowel sounds are normal. He exhibits no distension and no mass. There is no tenderness.  Small hernia present, otherwise abdomen is soft and nontender.  Musculoskeletal: Normal range of motion. He exhibits no edema or tenderness.  Left-sided above-the-knee amputation stump appears to be well-healed without redness induration or focal tenderness  Lymphadenopathy:    He has no cervical adenopathy.  Neurological: He is alert. Coordination normal.  Skin: Skin is warm and dry. No rash noted. No erythema.  Psychiatric: He has a normal mood and affect. His behavior is normal.  Nursing note and vitals reviewed.    ED Treatments / Results  Labs (all labs ordered are listed, but only abnormal results are displayed) Labs Reviewed  COMPREHENSIVE METABOLIC PANEL - Abnormal; Notable for the following:       Result Value     CO2 18 (*)    Glucose, Bld 147 (*)    BUN 26 (*)    Creatinine, Ser 1.46 (*)    ALT 16 (*)    Total Bilirubin 1.5 (*)    GFR calc non Af Amer 46 (*)    GFR calc Af Amer 53 (*)    All other components within normal limits  CBC WITH DIFFERENTIAL/PLATELET - Abnormal; Notable for the following:    Hemoglobin 17.3 (*)    All other components within normal limits  TROPONIN I - Abnormal; Notable for the following:    Troponin I 0.04 (*)    All other components within normal limits  CULTURE, BLOOD (ROUTINE X 2)  CULTURE, BLOOD (ROUTINE X 2)  URINE CULTURE  BRAIN NATRIURETIC PEPTIDE  URINALYSIS, ROUTINE W REFLEX MICROSCOPIC  I-STAT CG4 LACTIC ACID, ED    EKG  EKG Interpretation  Date/Time:  Saturday June 03 2017 19:55:05 EDT Ventricular Rate:  126 PR Interval:    QRS Duration: 110 QT Interval:  357 QTC Calculation: 517 R Axis:   85 Text Interpretation:  Sinus tachycardia Prolonged PR interval Left atrial enlargement Anterior infarct, old Nonspecific repol abnormality, diffuse leads Prolonged QT interval Baseline wander in lead(s) I III aVL aVF Since last tracing rate faster Confirmed by Noemi Chapel [phone]) on 06/03/2017 8:01:56 PM       Radiology Dg Chest Port 1 View  Result Date: 06/03/2017 CLINICAL DATA:  Cough and dyspnea EXAM: PORTABLE CHEST 1 VIEW COMPARISON:  05/24/2017 chest radiograph. FINDINGS: Intact sternotomy wires. CABG clips overlie the mediastinum. Stable cardiomediastinal silhouette with normal heart size and aortic atherosclerosis. No pneumothorax.  No pleural effusion. Lungs appear clear, with no acute consolidative airspace disease and no pulmonary edema. IMPRESSION: No active cardiopulmonary disease. Electronically Signed   By: Ilona Sorrel M.D.   On: 06/03/2017 20:44    Procedures Procedures (including critical care time)  Medications Ordered in ED Medications  0.9 %  sodium chloride infusion (1,000 mLs Intravenous New Bag/Given 06/03/17 2022)   albuterol (PROVENTIL,VENTOLIN) solution continuous neb (10 mg/hr Nebulization New Bag/Given 06/03/17 2017)  methylPREDNISolone sodium succinate (SOLU-MEDROL) 125 mg/2 mL injection 125 mg (not administered)  ondansetron (ZOFRAN) injection 4 mg (4 mg Intravenous Given 06/03/17 2033)  HYDROmorphone (DILAUDID) injection 0.5 mg (0.5 mg Intravenous Given 06/03/17 2055)  sodium chloride 0.9 % bolus 1,000 mL (1,000 mLs Intravenous New Bag/Given 06/03/17 2127)  LORazepam (ATIVAN) injection 1 mg (1 mg Intravenous Given 06/03/17 2126)  metoCLOPramide (REGLAN) injection 10 mg (10 mg Intravenous Given 06/03/17 2126)     Initial Impression / Assessment and Plan / ED Course  I have reviewed the triage vital signs and the nursing notes.  Pertinent labs & imaging results that were available during my care of the patient were reviewed by me and considered in my medical decision making (see chart for details).     The patient appears to be in acute distress. At this time it is unclear the exact cause though this could be related to pulmonary function, would also consider this to be related to his relative poor baseline, we'll check for uncontrolled diabetes, worsening renal function or signs of dehydration relative to decreased oral intake, check chest x-ray, labs, EKG, cardiac monitoring.   Labs reviewed and show a normal lactic acid, normal BNP, baseline creatinine and electrolytes, baseline blood counts  Troponin is slightly elevated but it has been chronically  Chest x-ray is without infiltrates  Case was discussed with the hospitalist, Dr. Nehemiah Settle who will admit to the hospital for further evaluation. The source of the patient's pain is unclear, he has been given a dose of pain medicine and I also added some IV fluids as well as some Ativan to help with his anxiety. He has been given multiple different antiemetics  Final Clinical Impressions(s) / ED Diagnoses   Final diagnoses:  Intractable vomiting  with nausea, unspecified vomiting type  COPD exacerbation ([place])    New Prescriptions New Prescriptions   No medications on file     Noemi Chapel, MD 06/03/17 2146

## 2017-06-03 NOTE — ED Notes (Signed)
ED Provider at bedside. 

## 2017-06-03 NOTE — ED Notes (Signed)
Report to Danley Danker, RN

## 2017-06-03 NOTE — ED Notes (Signed)
CRITICAL VALUE ALERT  Critical Value:  Troponin 0.04  Date & Time Notied:  06/03/17 @ 2049  Provider Notified: Dr. Sabra Heck  Orders Received/Actions taken: EDP made aware

## 2017-06-03 NOTE — ED Notes (Signed)
Pt getting breathing treatment reminded him I need a urine sample when possible

## 2017-06-04 LAB — BASIC METABOLIC PANEL
ANION GAP: 12 (ref 5–15)
BUN: 24 mg/dL — AB (ref 6–20)
CHLORIDE: 103 mmol/L (ref 101–111)
CO2: 20 mmol/L — AB (ref 22–32)
Calcium: 9.2 mg/dL (ref 8.9–10.3)
Creatinine, Ser: 1.3 mg/dL — ABNORMAL HIGH (ref 0.61–1.24)
GFR calc Af Amer: >60 mL/min (ref >60)
GFR calc non Af Amer: 52 mL/min — ABNORMAL LOW (ref >60)
GLUCOSE: 201 mg/dL — AB (ref 65–99)
POTASSIUM: 4.7 mmol/L (ref 3.5–5.1)
Sodium: 135 mmol/L (ref 135–145)

## 2017-06-04 LAB — CBC
HEMATOCRIT: 46.6 % (ref 39.0–52.0)
Hemoglobin: 15.8 g/dL (ref 13.0–17.0)
MCH: 30.8 pg (ref 26.0–34.0)
MCHC: 33.9 g/dL (ref 30.0–36.0)
MCV: 90.8 fL (ref 78.0–100.0)
Platelets: 205 10*3/uL (ref 150–400)
RBC: 5.13 MIL/uL (ref 4.22–5.81)
RDW: 13.9 % (ref 11.5–15.5)
WBC: 4.6 10*3/uL (ref 4.0–10.5)

## 2017-06-04 LAB — LACTIC ACID, PLASMA
Lactic Acid, Venous: 1.2 mmol/L (ref 0.5–1.9)
Lactic Acid, Venous: 1.7 mmol/L (ref 0.5–1.9)

## 2017-06-04 LAB — MRSA PCR SCREENING: MRSA by PCR: NEGATIVE

## 2017-06-04 LAB — GLUCOSE, CAPILLARY
GLUCOSE-CAPILLARY: 171 mg/dL — AB (ref 65–99)
GLUCOSE-CAPILLARY: 193 mg/dL — AB (ref 65–99)
GLUCOSE-CAPILLARY: 200 mg/dL — AB (ref 65–99)
GLUCOSE-CAPILLARY: 206 mg/dL — AB (ref 65–99)
Glucose-Capillary: 166 mg/dL — ABNORMAL HIGH (ref 65–99)

## 2017-06-04 LAB — HEPATIC FUNCTION PANEL
ALT: 15 U/L — ABNORMAL LOW (ref 17–63)
AST: 15 U/L (ref 15–41)
Albumin: 3.5 g/dL (ref 3.5–5.0)
Alkaline Phosphatase: 45 U/L (ref 38–126)
BILIRUBIN DIRECT: 0.1 mg/dL (ref 0.1–0.5)
BILIRUBIN INDIRECT: 0.9 mg/dL (ref 0.3–0.9)
BILIRUBIN TOTAL: 1 mg/dL (ref 0.3–1.2)
Total Protein: 6.8 g/dL (ref 6.5–8.1)

## 2017-06-04 LAB — LIPASE, BLOOD: LIPASE: 21 U/L (ref 11–51)

## 2017-06-04 MED ORDER — METHYLPREDNISOLONE SODIUM SUCC 125 MG IJ SOLR
80.0000 mg | Freq: Four times a day (QID) | INTRAMUSCULAR | Status: DC
  Administered 2017-06-04 – 2017-06-05 (×3): 80 mg via INTRAVENOUS
  Filled 2017-06-04 (×3): qty 2

## 2017-06-04 MED ORDER — NICOTINE 21 MG/24HR TD PT24: 21.0000 mg | MEDICATED_PATCH | Freq: Every day | TRANSDERMAL | Status: DC

## 2017-06-04 NOTE — Progress Notes (Signed)
[name]:[id number] DOB: Nov 28, 1940 DOA: 06/03/2017 PCP: Lucia Gaskins, MD   Physical Exam: Blood pressure (!) 188/106, pulse (!) 102, temperature 99 F (37.2 C), temperature source Oral, resp. rate (!) 22, height 6\' 3"  (1.905 m), weight 89.4 kg (197 lb), SpO2 95 %. Lungs show prolonged expiratory phase mild and very wheeze scattered rhonchi no rales appreciable heart regular rhythm no S3 or S4 no heaves thrills rubs abdomen soft no right upper quadrant tenderness no guarding or rebound masses no megaly   Investigations:  Recent Results (from the past 240 hour(s))  Blood Culture (routine x 2)     Status: None (Preliminary result)   Collection Time: 06/03/17  8:18 PM  Result Value Ref Range Status   Specimen Description RIGHT ANTECUBITAL  Final   Special Requests   Final    BOTTLES DRAWN AEROBIC ONLY Blood Culture results may not be optimal due to an inadequate volume of blood received in culture bottles   Culture NO GROWTH < 24 HOURS  Final   Report Status PENDING  Incomplete  Blood Culture (routine x 2)     Status: None (Preliminary result)   Collection Time: 06/03/17  8:45 PM  Result Value Ref Range Status   Specimen Description BLOOD RIGHT ARM  Final   Special Requests   Final    BOTTLES DRAWN AEROBIC ONLY Blood Culture results may not be optimal due to an inadequate volume of blood received in culture bottles   Culture NO GROWTH < 24 HOURS  Final   Report Status PENDING  Incomplete  MRSA PCR Screening     Status: None   Collection Time: 06/03/17 11:03 PM  Result Value Ref Range Status   MRSA by PCR NEGATIVE NEGATIVE Final    Comment:        The GeneXpert MRSA Assay (FDA approved for NASAL specimens only), is one component of a comprehensive MRSA colonization surveillance program. It is not intended to diagnose MRSA infection nor to guide or monitor treatment for MRSA infections.      Basic Metabolic Panel:  Recent Labs  06/03/17 2000 06/04/17 0753   NA 135 135  K 5.0 4.7  CL 103 103  CO2 18* 20*  GLUCOSE 147* 201*  BUN 26* 24*  CREATININE 1.46* 1.30*  CALCIUM 10.0 9.2   Liver Function Tests:  Recent Labs  06/03/17 2000  AST 16  ALT 16*  ALKPHOS 45  BILITOT 1.5*  PROT 7.8  ALBUMIN 4.3     CBC:  Recent Labs  06/03/17 2000 06/04/17 0753  WBC 8.9 4.6  NEUTROABS 6.2  --   HGB 17.3* 15.8  HCT 50.4 46.6  MCV 88.9 90.8  PLT 252 205    Dg Chest Port 1 View  Result Date: 06/03/2017 CLINICAL DATA:  Cough and dyspnea EXAM: PORTABLE CHEST 1 VIEW COMPARISON:  05/24/2017 chest radiograph. FINDINGS: Intact sternotomy wires. CABG clips overlie the mediastinum. Stable cardiomediastinal silhouette with normal heart size and aortic atherosclerosis. No pneumothorax. No pleural effusion. Lungs appear clear, with no acute consolidative airspace disease and no pulmonary edema. IMPRESSION: No active cardiopulmonary disease. Electronically Signed   By: Ilona Sorrel M.D.   On: 06/03/2017 20:44   Dg Abd 2 Views  Result Date: 06/03/2017 CLINICAL DATA:  Abdominal distention. EXAM: ABDOMEN - 2 VIEW COMPARISON:  05/09/2017 FINDINGS: The bowel gas pattern is normal. There is no evidence of free air. No radio-opaque calculi or other significant radiographic abnormality is seen. IMPRESSION: Negative. Electronically Signed  By: Kerby Moors M.D.   On: 06/03/2017 23:00      Medications:  Impression: Active Problems:   Leg pain, left   Dehydration   Hypertension   Diabetes mellitus with peripheral vascular disease (HCC)   COPD exacerbation ([facility])   CKD (chronic kidney disease), stage III   Intractable vomiting   Intractable nausea and vomiting   Constipation     Plan:We will add nicotine patch 21 mg because patient smokes pack and a half per day lead Seroquel 100 mg at bedtime tonight due to hypomanic behavior which appears constant  Consultants: None   Procedures  Antibiotics:          Time spent: 30 minutes   LOS: 1  day   Tamer Baughman M   06/04/2017, 12:03 PM

## 2017-06-04 NOTE — Progress Notes (Addendum)
Patient is refusing nebs he also does not like to take [facility] though he will. Will make nebs prn and give when needed.

## 2017-06-05 LAB — BASIC METABOLIC PANEL
Anion gap: 11 (ref 5–15)
BUN: 19 mg/dL (ref 6–20)
CALCIUM: 8.9 mg/dL (ref 8.9–10.3)
CO2: 20 mmol/L — ABNORMAL LOW (ref 22–32)
CREATININE: 0.96 mg/dL (ref 0.61–1.24)
Chloride: 103 mmol/L (ref 101–111)
Glucose, Bld: 189 mg/dL — ABNORMAL HIGH (ref 65–99)
Potassium: 4.5 mmol/L (ref 3.5–5.1)
SODIUM: 134 mmol/L — AB (ref 135–145)

## 2017-06-05 LAB — URINE CULTURE: Culture: NO GROWTH

## 2017-06-05 LAB — GLUCOSE, CAPILLARY: GLUCOSE-CAPILLARY: 226 mg/dL — AB (ref 65–99)

## 2017-06-05 MED ORDER — ALUM & MAG HYDROXIDE-SIMETH 200-200-20 MG/5ML PO SUSP
30.0000 mL | Freq: Once | ORAL | Status: AC
  Administered 2017-06-05: 30 mL via ORAL
  Filled 2017-06-05: qty 30

## 2017-06-05 NOTE — Progress Notes (Signed)
Pt informed of complications r/t leaving AMA including possibility of worsening condition. Pt states understanding and accepts any possible complications. MD notified. IV in Left forearm and L. AC D/C. Fluids D/C.

## 2017-06-08 DIAGNOSIS — R3912 Poor urinary stream: Secondary | ICD-10-CM | POA: Diagnosis not present

## 2017-06-08 DIAGNOSIS — R3129 Other microscopic hematuria: Secondary | ICD-10-CM | POA: Diagnosis not present

## 2017-06-08 LAB — CULTURE, BLOOD (ROUTINE X 2)
CULTURE: NO GROWTH
Culture: NO GROWTH

## 2017-06-12 NOTE — Discharge Summary (Signed)
The patient was admitted with COPD exacerbation. Some generalized abdominal discomfort on to prescription. Placed on IV Solu-Medrol as well as DuoNeb nebulizers for advanced COPD penis Doppler's were negative for DVT chest x-ray did not show any infiltrate and it was requested to hepatic profile and lipase on the patient to rule out any pancreatic or liver abnormalities of significance patient signed out AMA the next morning before results can be obtained and he stopped his IV steroids as well as his nebulizer therapy he does have nebulizer therapy at home Dr. Ayesha Rumpf [facility]

## 2017-06-23 ENCOUNTER — Emergency Department (HOSPITAL_COMMUNITY): Payer: Medicare Other

## 2017-06-23 ENCOUNTER — Emergency Department (HOSPITAL_COMMUNITY)
Admission: EM | Admit: 2017-06-23 | Discharge: 2017-06-23 | Disposition: A | Discharge status: Home / Self Care | Payer: Medicare Other | Attending: Emergency Medicine | Admitting: Emergency Medicine

## 2017-06-23 ENCOUNTER — Encounter (HOSPITAL_COMMUNITY): Payer: Self-pay | Admitting: Emergency Medicine

## 2017-06-23 DIAGNOSIS — I2510 Atherosclerotic heart disease of native coronary artery without angina pectoris: Secondary | ICD-10-CM | POA: Insufficient documentation

## 2017-06-23 DIAGNOSIS — J449 Chronic obstructive pulmonary disease, unspecified: Secondary | ICD-10-CM | POA: Insufficient documentation

## 2017-06-23 DIAGNOSIS — M79602 Pain in left arm: Secondary | ICD-10-CM | POA: Diagnosis not present

## 2017-06-23 DIAGNOSIS — Z85528 Personal history of other malignant neoplasm of kidney: Secondary | ICD-10-CM | POA: Insufficient documentation

## 2017-06-23 DIAGNOSIS — N183 Chronic kidney disease, stage 3 (moderate): Secondary | ICD-10-CM | POA: Diagnosis not present

## 2017-06-23 DIAGNOSIS — J45909 Unspecified asthma, uncomplicated: Secondary | ICD-10-CM | POA: Insufficient documentation

## 2017-06-23 DIAGNOSIS — E1122 Type 2 diabetes mellitus with diabetic chronic kidney disease: Secondary | ICD-10-CM | POA: Insufficient documentation

## 2017-06-23 DIAGNOSIS — R079 Chest pain, unspecified: Secondary | ICD-10-CM | POA: Diagnosis not present

## 2017-06-23 DIAGNOSIS — I129 Hypertensive chronic kidney disease with stage 1 through stage 4 chronic kidney disease, or unspecified chronic kidney disease: Secondary | ICD-10-CM | POA: Diagnosis not present

## 2017-06-23 DIAGNOSIS — R0602 Shortness of breath: Secondary | ICD-10-CM

## 2017-06-23 DIAGNOSIS — R05 Cough: Secondary | ICD-10-CM | POA: Diagnosis not present

## 2017-06-23 DIAGNOSIS — F17210 Nicotine dependence, cigarettes, uncomplicated: Secondary | ICD-10-CM | POA: Insufficient documentation

## 2017-06-23 DIAGNOSIS — Z951 Presence of aortocoronary bypass graft: Secondary | ICD-10-CM | POA: Diagnosis not present

## 2017-06-23 DIAGNOSIS — R0682 Tachypnea, not elsewhere classified: Secondary | ICD-10-CM | POA: Diagnosis not present

## 2017-06-23 DIAGNOSIS — Z905 Acquired absence of kidney: Secondary | ICD-10-CM | POA: Insufficient documentation

## 2017-06-23 LAB — CBC WITH DIFFERENTIAL/PLATELET
BASOS ABS: 0 10*3/uL (ref 0.0–0.1)
BASOS PCT: 0 %
EOS ABS: 0.2 10*3/uL (ref 0.0–0.7)
Eosinophils Relative: 2 %
HEMATOCRIT: 45.9 % (ref 39.0–52.0)
Hemoglobin: 15.6 g/dL (ref 13.0–17.0)
Lymphocytes Relative: 34 %
Lymphs Abs: 2.6 10*3/uL (ref 0.7–4.0)
MCH: 30.8 pg (ref 26.0–34.0)
MCHC: 34 g/dL (ref 30.0–36.0)
MCV: 90.7 fL (ref 78.0–100.0)
MONO ABS: 0.6 10*3/uL (ref 0.1–1.0)
Monocytes Relative: 8 %
NEUTROS ABS: 4.2 10*3/uL (ref 1.7–7.7)
Neutrophils Relative %: 56 %
PLATELETS: 232 10*3/uL (ref 150–400)
RBC: 5.06 MIL/uL (ref 4.22–5.81)
RDW: 14.4 % (ref 11.5–15.5)
WBC: 7.6 10*3/uL (ref 4.0–10.5)

## 2017-06-23 LAB — BRAIN NATRIURETIC PEPTIDE: B Natriuretic Peptide: 82 pg/mL (ref 0.0–100.0)

## 2017-06-23 LAB — LIPASE, BLOOD: LIPASE: 15 U/L (ref 11–51)

## 2017-06-23 LAB — COMPREHENSIVE METABOLIC PANEL
ALBUMIN: 3.9 g/dL (ref 3.5–5.0)
ALT: 15 U/L — ABNORMAL LOW (ref 17–63)
ANION GAP: 11 (ref 5–15)
AST: 15 U/L (ref 15–41)
Alkaline Phosphatase: 50 U/L (ref 38–126)
BILIRUBIN TOTAL: 0.8 mg/dL (ref 0.3–1.2)
BUN: 22 mg/dL — AB (ref 6–20)
CHLORIDE: 103 mmol/L (ref 101–111)
CO2: 20 mmol/L — AB (ref 22–32)
Calcium: 9.4 mg/dL (ref 8.9–10.3)
Creatinine, Ser: 1.48 mg/dL — ABNORMAL HIGH (ref 0.61–1.24)
GFR calc Af Amer: 52 mL/min — ABNORMAL LOW (ref >60)
GFR calc non Af Amer: 45 mL/min — ABNORMAL LOW (ref >60)
GLUCOSE: 100 mg/dL — AB (ref 65–99)
POTASSIUM: 4.8 mmol/L (ref 3.5–5.1)
SODIUM: 134 mmol/L — AB (ref 135–145)
Total Protein: 7 g/dL (ref 6.5–8.1)

## 2017-06-23 LAB — TROPONIN I: Troponin I: 0.03 ng/mL (ref <0.03)

## 2017-06-23 MED ORDER — GI COCKTAIL ~~LOC~~
30.0000 mL | Freq: Once | ORAL | Status: AC
  Administered 2017-06-23: 30 mL via ORAL
  Filled 2017-06-23: qty 30

## 2017-06-23 MED ORDER — METOCLOPRAMIDE HCL 5 MG/ML IJ SOLN
10.0000 mg | Freq: Once | INTRAMUSCULAR | Status: AC
  Administered 2017-06-23: 10 mg via INTRAVENOUS
  Filled 2017-06-23: qty 2

## 2017-06-23 MED ORDER — IPRATROPIUM-ALBUTEROL 0.5-2.5 (3) MG/3ML IN SOLN
3.0000 mL | Freq: Once | RESPIRATORY_TRACT | Status: AC
  Administered 2017-06-23: 3 mL via RESPIRATORY_TRACT
  Filled 2017-06-23: qty 3

## 2017-06-23 NOTE — ED Notes (Signed)
Pt rolling self out of ED.

## 2017-06-23 NOTE — ED Triage Notes (Signed)
Pt here for shortness of breath and cough x 1 week.

## 2017-06-23 NOTE — ED Provider Notes (Signed)
Emergency Department Provider Note   I have reviewed the triage vital signs and the nursing notes.   HISTORY  Chief Complaint Shortness of Breath   HPI [name] is a 76 y.o. male multiple medical problems to include obesity, coronary artery disease status post CABG, hypertension, hyperlipidemia and renal cancer status post resection who presents the emergency department multiple complaints but seems the most salient is worsening chest pain.  Patient states he is used to having some sharp pain but he has been having multiple episodes of left-sided chest pressure radiating to his left arm associated with cold sweats and shortness of breath over the last couple days.  It seems to resolve with nitroglycerin however keeps returning at random times.  States he has had vomiting multiple times in the last 3-4 days and on review of the records he was just recently discharged a couple weeks ago for having emesis and abdominal pain.  Patient does not relate any fever but just he has had a cough.  No significant abdominal pain aside from his dry heaving.  No new back pain.  Tried some at home nausea medicine which did not seem to help with his symptoms.  Has taken 5 nitroglycerin in the last 24 hours for the chest pain.  No other associated modifying symptoms.  States the chest pressure feels similar to previous heart attack.    Past Medical History:  Diagnosis Date  . Anginal pain ([place])   . Arthritis    "qwhere" (07/09/2013)  . Asthma   . COPD (chronic obstructive pulmonary disease) ([place])    pt denies this hx on 07/09/2013  . Coronary artery disease   . Gout   . H/O hiatal hernia 1997  . Heart murmur    "had rheumatic fever as a kid" (07/09/2013)  . High cholesterol   . Hypertension   . Myocardial infarction ([place]) 03/2002; ~ 2011  . Pneumonia    "once" (07/09/2013)  . Renal disorder    "only have 1 kidney; due to go back soon to check the other one" (07/09/2013)  . Rheumatic fever   .  RUPTURE ROTATOR CUFF 12/03/2009   Qualifier: Diagnosis of  By: Aline Brochure MD, Dorothyann Peng    . Shortness of breath    "can happen at anytime" (07/09/2013)  . Type II diabetes mellitus ([place])   . 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 Intractable vomiting 06/03/2017  . Intractable nausea and vomiting 06/03/2017  . Constipation 06/03/2017  . Pressure injury of skin 05/25/2017  . PAD (peripheral artery disease) ([place]) 05/24/2017  . CKD (chronic kidney disease), stage III ([place]) 06/29/2016  . COPD exacerbation ([place]) 06/16/2016  . Hyperkalemia 11/24/2015  . UTI (lower urinary tract infection) 11/24/2015  . Acute UTI 09/11/2014  . Acute respiratory failure with hypercapnia ([place]) 09/10/2014  . Acute encephalopathy 09/10/2014  . Alcoholic dementia ([place]) 03/50/0938  . Recurrent falls 09/10/2014  . Left shoulder pain 09/10/2014  . CAD in native artery 09/10/2014  . Diabetes mellitus with peripheral vascular disease ([place]) 09/10/2014  . FTT (failure to thrive) in adult 09/10/2014  . Protein-calorie malnutrition ([place]) 09/10/2014  . Mixed acid base balance disorder 09/09/2014  . Acute kidney injury ([place]) 09/09/2014  . Hypotension 09/09/2014  . Fall 08/27/2014  . Altered mental status 08/26/2014  . Dehydration 08/26/2014  . Alcohol abuse 08/26/2014  . Hypertension  08/26/2014  . Aftercare following surgery of the circulatory system, [place] 08/05/2013  . Leg pain, left 07/01/2013  . Peripheral vascular disease ([place]) 05/27/2013  . Peripheral angiopathy in diseases classified elsewhere ([place]) 05/02/2012  . Diabetes ([place]) 05/02/2012  . Pain in limb 05/02/2012  . Chronic total occlusion of artery of the extremities ([place]) 05/02/2012  . JOINT EFFUSION, KNEE 12/03/2009  . RUPTURE ROTATOR CUFF 12/03/2009  . ISCHEMIC CARDIOMYOPATHY 08/24/2009  . CHF 08/24/2009  . BACK PAIN, LUMBAR, CHRONIC 08/24/2009     Past Surgical History:  Procedure Laterality Date  . ABOVE KNEE LEG AMPUTATION Left 09/2009  . BELOW KNEE LEG AMPUTATION Left 08/2009  . CARDIAC CATHETERIZATION  ~ 2010   "before the bypass" (07/09/2013)  . CARPAL TUNNEL RELEASE Bilateral ~2004; ~ 2011  . CHOLECYSTECTOMY  1980's  . CORONARY ARTERY BYPASS GRAFT  ~ 2010   "CABG X5" (07/09/2013)  . EYE SURGERY    . FEMORAL ARTERY STENT Right 07/09/2013  . GLAUCOMA SURGERY Bilateral ?[id number]'P  . LOWER EXTREMITY ANGIOGRAM Right Nov. 4, 2014  . LOWER EXTREMITY ANGIOGRAM Right 07/09/2013   Procedure: LOWER EXTREMITY ANGIOGRAM;  Surgeon: Serafina Mitchell, MD;  Location: [facility] CATH LAB;  Service: Cardiovascular;  Laterality: Right;  . NEPHRECTOMY Left 12/19/2001  . PERIPHERAL VASCULAR CATHETERIZATION N/A 02/03/2015   Procedure: Abdominal Aortogram;  Surgeon: Serafina Mitchell, MD;  Location: [place] CV LAB;  Service: Cardiovascular;  Laterality: N/A;  . TRIGGER FINGER RELEASE Left ~ 1990's    Current Outpatient Rx  . Order #: [id number] Class: Historical Med  . Order #: [id number] Class: Historical Med  . Order #: [id number] Class: Historical Med  . Order #: [id number] Class: Historical Med  . Order #: [id number] Class: Normal  . Order #: [id number] Class: Normal  . Order #: [id number] Class: Normal  . Order #: [id number] Class: Normal  . Order #: [id number] Class: No Print  . Order #: [id number] Class: Normal  . Order #: [id number] Class: Historical Med  . Order #: [id number] Class: Normal  . Order #: [id number] Class: Historical Med  . Order #: [id number] Class: Historical Med  . Order #: [id number] Class: Normal  . Order #: [id number] Class: Historical Med  . Order #: [id number] Class: Print  . Order #: [id number] Class: Print  . Order #: [id number] Class: Historical Med  . Order #: [id number] Class: Print  . Order #: [id number] Class: Historical Med  . Order #: [id number] Class: Historical Med  . Order #: [id number] Class: Normal  . Order #: [id number] Class: Print  . Order  #: [id number] Class: Historical Med  . Order #: [id number] Class: Historical Med    Allergies Erythromycin; Iohexol; and Penicillins  Family History  Problem Relation Age of Onset  . Cancer Mother   . Heart attack Father     Social History Social History  Substance Use Topics  . Smoking status: Current Every Day Smoker    Packs/day: 1.50    Years: 54.00    Types: Cigarettes  . Smokeless tobacco: Never Used  . Alcohol use No    Review of Systems  All other systems negative except as documented in the HPI. All pertinent positives and negatives as reviewed in the HPI. ____________________________________________   PHYSICAL EXAM:  VITAL SIGNS: ED Triage Vitals [06/23/17 1622]  Enc Vitals Group     BP      Pulse      Resp      Temp      Temp src      SpO2      Weight 197 lb (89.4 kg)     Height  6\' 3"  (1.905 m)     Head Circumference      Peak Flow      Pain Score 0     Pain Loc      Pain Edu?      Excl. in [place]?     Constitutional: Alert and oriented. Well appearing and in no acute distress. Eyes: Conjunctivae are normal. PERRL. EOMI. Head: Atraumatic. Nose: No congestion/rhinnorhea. Mouth/Throat: Mucous membranes are moist.  Oropharynx non-erythematous. Neck: No stridor.  No meningeal signs.   Cardiovascular: Normal rate, regular rhythm. Good peripheral circulation. Grossly normal heart sounds.   Respiratory: Normal respiratory effort.  No retractions. Lungs with diffuse wheezing and rhonchi. Gastrointestinal: Soft and nontender. No distention.  Musculoskeletal: No lower extremity tenderness nor edema. No gross deformities of extremities. Neurologic:  Normal speech and language. No gross focal neurologic deficits are appreciated.  Skin:  Skin is warm, dry and intact. No rash noted.   ____________________________________________   LABS (all labs ordered are listed, but only abnormal results are displayed)  Labs Reviewed  COMPREHENSIVE METABOLIC PANEL -  Abnormal; Notable for the following:       Result Value   Sodium 134 (*)    CO2 20 (*)    Glucose, Bld 100 (*)    BUN 22 (*)    Creatinine, Ser 1.48 (*)    ALT 15 (*)    GFR calc non Af Amer 45 (*)    GFR calc Af Amer 52 (*)    All other components within normal limits  TROPONIN I - Abnormal; Notable for the following:    Troponin I 0.03 (*)    All other components within normal limits  CBC WITH DIFFERENTIAL/PLATELET  BRAIN NATRIURETIC PEPTIDE  LIPASE, BLOOD   ____________________________________________  EKG   EKG Interpretation  Date/Time:  Friday June 23 2017 16:45:33 EDT Ventricular Rate:  90 PR Interval:  224 QRS Duration: 98 QT Interval:  378 QTC Calculation: 462 R Axis:   74 Text Interpretation:  Sinus rhythm with 1st degree A-V block Low voltage QRS Septal infarct , age undetermined Abnormal ECG previous ECG not able to be interpreted for comparison no change from comparison on 9/29 this one without acute changes from  Confirmed by Merrily Pew [id number]) on 06/23/2017 4:55:13 PM       ____________________________________________  RADIOLOGY  Dg Chest 2 View  Result Date: 06/23/2017 CLINICAL DATA:  Dyspnea cough x1 week. EXAM: CHEST  2 VIEW COMPARISON:  06/03/2017 FINDINGS: The heart size and mediastinal contours are within normal limits. The patient is status post CABG. There is aortic atherosclerosis at the arch without aneurysm. Mild hyperinflation of the lungs, upper lobe predominant. Mild extrapleural thickening is suggested along the periphery of the left lung base, stable in appearance. No effusion or pneumothorax. The visualized skeletal structures are nonacute. IMPRESSION: 1. No pneumonic consolidation. 2. Upper lobe predominant hyperinflation of the lungs. 3. Status post CABG. Electronically Signed   By: Ashley Royalty M.D.   On: 06/23/2017 18:03    ____________________________________________   PROCEDURES  Procedure(s) performed:    Procedures   ____________________________________________   INITIAL IMPRESSION / ASSESSMENT AND PLAN / ED COURSE  Pertinent labs & imaging results that were available during my care of the patient were reviewed by me and considered in my medical decision making (see chart for details). COPD vs ACS as cause of cp/sob. Will treat and evaluate for both.  Will likely need to be observed overnight for delta troponins to rule  out ACS. This far as the vomiting goes, I will check a lipase liver function labs but could also just be related to gastritis or esophagitis.  We will give him a GI cocktail.  Patient left AMA secondary to not being comfortable. Tried to reason with him however he was cognizant of the ramifications of his decision (possible worsening condition, death, missing a serious event that could be life-altering) and competent to make it so was allowed to leave AMA.  ____________________________________________  FINAL CLINICAL IMPRESSION(S) / ED DIAGNOSES  Final diagnoses:  SOB (shortness of breath)     MEDICATIONS GIVEN DURING THIS VISIT:  Medications  ipratropium-albuterol (DUONEB) 0.5-2.5 (3) MG/3ML nebulizer solution 3 mL (3 mLs Nebulization Given 06/23/17 1724)  metoCLOPramide (REGLAN) injection 10 mg (10 mg Intravenous Given 06/23/17 1743)  gi cocktail (Maalox,Lidocaine,Donnatal) (30 mLs Oral Given 06/23/17 1743)     NEW OUTPATIENT MEDICATIONS STARTED DURING THIS VISIT:  Discharge Medication List as of 06/23/2017  6:34 PM      Note:  This document was prepared using Dragon voice recognition software and may include unintentional dictation errors.   Merrily Pew, MD 06/23/17 [phone]

## 2017-06-23 NOTE — ED Notes (Signed)
Pt wants to leave AMA, states, "I just can't get comfortable". EDP notified. Pt's IV removed from left anterior forearm. Bleeding upon IV removal. Pressure held. Bruising to site. IV catheter tip intact. EDP at bedside to speak with pt. AMA paperwork signed.

## 2017-06-28 DIAGNOSIS — M2550 Pain in unspecified joint: Secondary | ICD-10-CM | POA: Diagnosis not present

## 2017-06-28 DIAGNOSIS — G894 Chronic pain syndrome: Secondary | ICD-10-CM | POA: Diagnosis not present

## 2017-06-28 DIAGNOSIS — M545 Low back pain: Secondary | ICD-10-CM | POA: Diagnosis not present

## 2017-06-28 DIAGNOSIS — R112 Nausea with vomiting, unspecified: Secondary | ICD-10-CM | POA: Diagnosis not present

## 2017-07-11 DIAGNOSIS — M2550 Pain in unspecified joint: Secondary | ICD-10-CM | POA: Diagnosis not present

## 2017-07-11 DIAGNOSIS — I119 Hypertensive heart disease without heart failure: Secondary | ICD-10-CM | POA: Diagnosis not present

## 2017-07-11 DIAGNOSIS — G894 Chronic pain syndrome: Secondary | ICD-10-CM | POA: Diagnosis not present

## 2017-07-11 DIAGNOSIS — M545 Low back pain: Secondary | ICD-10-CM | POA: Diagnosis not present

## 2017-08-08 DIAGNOSIS — M545 Low back pain: Secondary | ICD-10-CM | POA: Diagnosis not present

## 2017-08-08 DIAGNOSIS — K219 Gastro-esophageal reflux disease without esophagitis: Secondary | ICD-10-CM | POA: Diagnosis not present

## 2017-08-08 DIAGNOSIS — M2550 Pain in unspecified joint: Secondary | ICD-10-CM | POA: Diagnosis not present

## 2017-08-08 DIAGNOSIS — G894 Chronic pain syndrome: Secondary | ICD-10-CM | POA: Diagnosis not present

## 2017-09-11 DIAGNOSIS — H40033 Anatomical narrow angle, bilateral: Secondary | ICD-10-CM | POA: Diagnosis not present

## 2017-09-13 DIAGNOSIS — M545 Low back pain: Secondary | ICD-10-CM | POA: Diagnosis not present

## 2017-09-13 DIAGNOSIS — M2550 Pain in unspecified joint: Secondary | ICD-10-CM | POA: Diagnosis not present

## 2017-09-13 DIAGNOSIS — K219 Gastro-esophageal reflux disease without esophagitis: Secondary | ICD-10-CM | POA: Diagnosis not present

## 2017-09-13 DIAGNOSIS — N400 Enlarged prostate without lower urinary tract symptoms: Secondary | ICD-10-CM | POA: Diagnosis not present

## 2017-09-14 DIAGNOSIS — E7849 Other hyperlipidemia: Secondary | ICD-10-CM | POA: Diagnosis not present

## 2017-09-14 DIAGNOSIS — E559 Vitamin D deficiency, unspecified: Secondary | ICD-10-CM | POA: Diagnosis not present

## 2017-09-14 DIAGNOSIS — R5383 Other fatigue: Secondary | ICD-10-CM | POA: Diagnosis not present

## 2017-09-14 DIAGNOSIS — D510 Vitamin B12 deficiency anemia due to intrinsic factor deficiency: Secondary | ICD-10-CM | POA: Diagnosis not present

## 2017-09-14 DIAGNOSIS — I110 Hypertensive heart disease with heart failure: Secondary | ICD-10-CM | POA: Diagnosis not present

## 2017-09-14 DIAGNOSIS — Z125 Encounter for screening for malignant neoplasm of prostate: Secondary | ICD-10-CM | POA: Diagnosis not present

## 2017-09-25 DIAGNOSIS — M2550 Pain in unspecified joint: Secondary | ICD-10-CM | POA: Diagnosis not present

## 2017-09-25 DIAGNOSIS — J449 Chronic obstructive pulmonary disease, unspecified: Secondary | ICD-10-CM | POA: Diagnosis not present

## 2017-09-25 DIAGNOSIS — K219 Gastro-esophageal reflux disease without esophagitis: Secondary | ICD-10-CM | POA: Diagnosis not present

## 2017-09-25 DIAGNOSIS — M545 Low back pain: Secondary | ICD-10-CM | POA: Diagnosis not present

## 2017-09-25 DIAGNOSIS — R0602 Shortness of breath: Secondary | ICD-10-CM | POA: Diagnosis not present

## 2017-09-29 DIAGNOSIS — H2512 Age-related nuclear cataract, left eye: Secondary | ICD-10-CM | POA: Diagnosis not present

## 2017-12-19 DIAGNOSIS — K219 Gastro-esophageal reflux disease without esophagitis: Secondary | ICD-10-CM | POA: Diagnosis not present

## 2017-12-19 DIAGNOSIS — E7849 Other hyperlipidemia: Secondary | ICD-10-CM | POA: Diagnosis not present

## 2017-12-19 DIAGNOSIS — E1165 Type 2 diabetes mellitus with hyperglycemia: Secondary | ICD-10-CM | POA: Diagnosis not present

## 2018-01-18 DIAGNOSIS — M545 Low back pain: Secondary | ICD-10-CM | POA: Diagnosis not present

## 2018-01-18 DIAGNOSIS — G894 Chronic pain syndrome: Secondary | ICD-10-CM | POA: Diagnosis not present

## 2018-01-18 DIAGNOSIS — M2550 Pain in unspecified joint: Secondary | ICD-10-CM | POA: Diagnosis not present

## 2018-01-18 DIAGNOSIS — E1165 Type 2 diabetes mellitus with hyperglycemia: Secondary | ICD-10-CM | POA: Diagnosis not present

## 2018-03-27 DIAGNOSIS — E1165 Type 2 diabetes mellitus with hyperglycemia: Secondary | ICD-10-CM | POA: Diagnosis not present

## 2018-03-27 DIAGNOSIS — M2550 Pain in unspecified joint: Secondary | ICD-10-CM | POA: Diagnosis not present

## 2018-03-27 DIAGNOSIS — R5382 Chronic fatigue, unspecified: Secondary | ICD-10-CM | POA: Diagnosis not present

## 2018-03-27 DIAGNOSIS — M545 Low back pain: Secondary | ICD-10-CM | POA: Diagnosis not present

## 2018-04-25 ENCOUNTER — Other Ambulatory Visit (HOSPITAL_COMMUNITY): Payer: Self-pay | Admitting: Family Medicine

## 2018-04-25 DIAGNOSIS — J449 Chronic obstructive pulmonary disease, unspecified: Secondary | ICD-10-CM | POA: Diagnosis not present

## 2018-04-25 DIAGNOSIS — K219 Gastro-esophageal reflux disease without esophagitis: Secondary | ICD-10-CM | POA: Diagnosis not present

## 2018-04-25 DIAGNOSIS — M545 Low back pain: Secondary | ICD-10-CM | POA: Diagnosis not present

## 2018-04-25 DIAGNOSIS — K3189 Other diseases of stomach and duodenum: Secondary | ICD-10-CM

## 2018-04-25 DIAGNOSIS — E1165 Type 2 diabetes mellitus with hyperglycemia: Secondary | ICD-10-CM | POA: Diagnosis not present

## 2018-05-15 ENCOUNTER — Encounter (HOSPITAL_COMMUNITY): Payer: Self-pay

## 2018-05-15 ENCOUNTER — Ambulatory Visit (HOSPITAL_COMMUNITY): Payer: Medicare Other

## 2018-06-03 ENCOUNTER — Encounter (HOSPITAL_COMMUNITY): Payer: Self-pay | Admitting: Emergency Medicine

## 2018-06-03 ENCOUNTER — Emergency Department (HOSPITAL_COMMUNITY): Payer: Medicare Other

## 2018-06-03 ENCOUNTER — Emergency Department (HOSPITAL_COMMUNITY)
Admission: EM | Admit: 2018-06-03 | Discharge: 2018-06-03 | Disposition: A | Discharge status: Home / Self Care | Payer: Medicare Other | Attending: Emergency Medicine | Admitting: Emergency Medicine

## 2018-06-03 ENCOUNTER — Other Ambulatory Visit: Payer: Self-pay

## 2018-06-03 DIAGNOSIS — N183 Chronic kidney disease, stage 3 (moderate): Secondary | ICD-10-CM | POA: Insufficient documentation

## 2018-06-03 DIAGNOSIS — N390 Urinary tract infection, site not specified: Secondary | ICD-10-CM | POA: Insufficient documentation

## 2018-06-03 DIAGNOSIS — I129 Hypertensive chronic kidney disease with stage 1 through stage 4 chronic kidney disease, or unspecified chronic kidney disease: Secondary | ICD-10-CM | POA: Insufficient documentation

## 2018-06-03 DIAGNOSIS — E1122 Type 2 diabetes mellitus with diabetic chronic kidney disease: Secondary | ICD-10-CM | POA: Insufficient documentation

## 2018-06-03 DIAGNOSIS — F17210 Nicotine dependence, cigarettes, uncomplicated: Secondary | ICD-10-CM | POA: Insufficient documentation

## 2018-06-03 DIAGNOSIS — R079 Chest pain, unspecified: Secondary | ICD-10-CM | POA: Diagnosis not present

## 2018-06-03 DIAGNOSIS — Z951 Presence of aortocoronary bypass graft: Secondary | ICD-10-CM | POA: Diagnosis not present

## 2018-06-03 DIAGNOSIS — Z89612 Acquired absence of left leg above knee: Secondary | ICD-10-CM | POA: Insufficient documentation

## 2018-06-03 DIAGNOSIS — Z79899 Other long term (current) drug therapy: Secondary | ICD-10-CM | POA: Insufficient documentation

## 2018-06-03 DIAGNOSIS — Z794 Long term (current) use of insulin: Secondary | ICD-10-CM | POA: Diagnosis not present

## 2018-06-03 DIAGNOSIS — I252 Old myocardial infarction: Secondary | ICD-10-CM | POA: Insufficient documentation

## 2018-06-03 DIAGNOSIS — Z7982 Long term (current) use of aspirin: Secondary | ICD-10-CM | POA: Diagnosis not present

## 2018-06-03 DIAGNOSIS — J189 Pneumonia, unspecified organism: Secondary | ICD-10-CM | POA: Diagnosis not present

## 2018-06-03 DIAGNOSIS — J181 Lobar pneumonia, unspecified organism: Secondary | ICD-10-CM | POA: Insufficient documentation

## 2018-06-03 DIAGNOSIS — I2510 Atherosclerotic heart disease of native coronary artery without angina pectoris: Secondary | ICD-10-CM | POA: Insufficient documentation

## 2018-06-03 HISTORY — DX: Polyneuropathy, unspecified: G62.9

## 2018-06-03 LAB — TROPONIN I: TROPONIN I: 0.03 ng/mL — AB (ref <0.03)

## 2018-06-03 LAB — GLUCOSE, CAPILLARY: GLUCOSE-CAPILLARY: 240 mg/dL — AB (ref 70–99)

## 2018-06-03 LAB — CBC
HCT: 46.2 % (ref 39.0–52.0)
HEMOGLOBIN: 15.5 g/dL (ref 13.0–17.0)
MCH: 31.1 pg (ref 26.0–34.0)
MCHC: 33.5 g/dL (ref 30.0–36.0)
MCV: 92.6 fL (ref 78.0–100.0)
PLATELETS: 225 10*3/uL (ref 150–400)
RBC: 4.99 MIL/uL (ref 4.22–5.81)
RDW: 14.3 % (ref 11.5–15.5)
WBC: 6.1 10*3/uL (ref 4.0–10.5)

## 2018-06-03 LAB — URINALYSIS, ROUTINE W REFLEX MICROSCOPIC
Bacteria, UA: NONE SEEN
Bilirubin Urine: NEGATIVE
GLUCOSE, UA: NEGATIVE mg/dL
Hgb urine dipstick: NEGATIVE
KETONES UR: NEGATIVE mg/dL
Nitrite: NEGATIVE
PH: 6 (ref 5.0–8.0)
Protein, ur: 100 mg/dL — AB
SPECIFIC GRAVITY, URINE: 1.017 (ref 1.005–1.030)
WBC, UA: >50 WBC/hpf — ABNORMAL HIGH (ref 0–5)

## 2018-06-03 LAB — BASIC METABOLIC PANEL
Anion gap: 9 (ref 5–15)
BUN: 22 mg/dL (ref 8–23)
CHLORIDE: 96 mmol/L — AB (ref 98–111)
CO2: 27 mmol/L (ref 22–32)
CREATININE: 1.3 mg/dL — AB (ref 0.61–1.24)
Calcium: 9.5 mg/dL (ref 8.9–10.3)
GFR calc Af Amer: >60 mL/min (ref >60)
GFR calc non Af Amer: 52 mL/min — ABNORMAL LOW (ref >60)
Glucose, Bld: 241 mg/dL — ABNORMAL HIGH (ref 70–99)
POTASSIUM: 5 mmol/L (ref 3.5–5.1)
Sodium: 132 mmol/L — ABNORMAL LOW (ref 135–145)

## 2018-06-03 MED ORDER — DOXYCYCLINE HYCLATE 100 MG PO CAPS: 100.0000 mg | ORAL_CAPSULE | Freq: Two times a day (BID) | ORAL | 0 refills | Status: DC

## 2018-06-03 MED ORDER — DOXYCYCLINE HYCLATE 100 MG PO TABS
100.0000 mg | ORAL_TABLET | Freq: Once | ORAL | Status: AC
  Administered 2018-06-03: 100 mg via ORAL
  Filled 2018-06-03: qty 1

## 2018-06-03 NOTE — ED Provider Notes (Signed)
[facility] EMERGENCY DEPARTMENT Provider Note   CSN: [id number] Arrival date & time: 06/03/18  1748     History   Chief Complaint Chief Complaint  Patient presents with  . Chest Pain    HPI [name] is a 77 y.o. male.  HPI   Patient here for evaluation of chest pain.  He has been using nitroglycerin for it.  Is a history of coronary bypass.  Also has COPD.  His blood pressure was elevated at home today.  He complains of intermittent left sided headache, associated with periods of high blood pressure which come and go.  He used nitroglycerin yesterday but none today.  He denies cough, shortness of breath abdominal or back pain.  He has noticed sediment in his urine for the last 2 days.  He is taking his usual medications.  He denies other problems.  There are no other no modifying factors.  Past Medical History:  Diagnosis Date  . Anginal pain ([place])   . Arthritis    "qwhere" (07/09/2013)  . Asthma   . COPD (chronic obstructive pulmonary disease) ([place])    pt denies this hx on 07/09/2013  . Coronary artery disease   . Gout   . H/O hiatal hernia 1997  . Heart murmur    "had rheumatic fever as a kid" (07/09/2013)  . High cholesterol   . Hypertension   . Myocardial infarction ([place]) 03/2002; ~ 2011  . Neuropathy   . Pneumonia    "once" (07/09/2013)  . Renal disorder    "only have 1 kidney; due to go back soon to check the other one" (07/09/2013)  . Rheumatic fever   . RUPTURE ROTATOR CUFF 12/03/2009   Qualifier: Diagnosis of  By: Aline Brochure MD, Dorothyann Peng    . Shortness of breath    "can happen at anytime" (07/09/2013)  . Type II diabetes mellitus ([place])   . Ureter cancer [facility]) 12/2001   "shut down my kidney & resulted in nephrectomy" (07/09/2013)    Patient Active Problem List   Diagnosis Date Noted  . Intractable vomiting 06/03/2017  . Intractable nausea and vomiting 06/03/2017  . Constipation 06/03/2017  . Pressure injury of skin 05/25/2017  . PAD (peripheral artery  disease) ([place]) 05/24/2017  . CKD (chronic kidney disease), stage III ([place]) 06/29/2016  . COPD exacerbation ([place]) 06/16/2016  . Hyperkalemia 11/24/2015  . UTI (lower urinary tract infection) 11/24/2015  . Acute UTI 09/11/2014  . Acute respiratory failure with hypercapnia ([place]) 09/10/2014  . Acute encephalopathy 09/10/2014  . Alcoholic dementia ([place]) 97/10/6376  . Recurrent falls 09/10/2014  . Left shoulder pain 09/10/2014  . CAD in native artery 09/10/2014  . Diabetes mellitus with peripheral vascular disease ([place]) 09/10/2014  . FTT (failure to thrive) in adult 09/10/2014  . Protein-calorie malnutrition ([place]) 09/10/2014  . Mixed acid base balance disorder 09/09/2014  . Acute kidney injury ([place]) 09/09/2014  . Hypotension 09/09/2014  . Fall 08/27/2014  . Altered mental status 08/26/2014  . Dehydration 08/26/2014  . Alcohol abuse 08/26/2014  . Hypertension 08/26/2014  . Aftercare following surgery of the circulatory system, [place] 08/05/2013  . Leg pain, left 07/01/2013  . Peripheral vascular disease ([place]) 05/27/2013  . Peripheral angiopathy in diseases classified elsewhere ([place]) 05/02/2012  . Diabetes ([place]) 05/02/2012  . Pain in limb 05/02/2012  . Chronic total occlusion of artery of the extremities ([place]) 05/02/2012  . JOINT EFFUSION, KNEE 12/03/2009  . RUPTURE ROTATOR CUFF 12/03/2009  . ISCHEMIC CARDIOMYOPATHY 08/24/2009  .  CHF 08/24/2009  . BACK PAIN, LUMBAR, CHRONIC 08/24/2009    Past Surgical History:  Procedure Laterality Date  . ABOVE KNEE LEG AMPUTATION Left 09/2009  . BELOW KNEE LEG AMPUTATION Left 08/2009  . CARDIAC CATHETERIZATION  ~ 2010   "before the bypass" (07/09/2013)  . CARPAL TUNNEL RELEASE Bilateral ~2004; ~ 2011  . CHOLECYSTECTOMY  1980's  . CORONARY ARTERY BYPASS GRAFT  ~ 2010   "CABG X5" (07/09/2013)  . EYE SURGERY    . FEMORAL ARTERY STENT Right 07/09/2013  . GLAUCOMA SURGERY Bilateral ?[id number]  . LOWER EXTREMITY ANGIOGRAM Right Nov. 4, 2014  . LOWER  EXTREMITY ANGIOGRAM Right 07/09/2013   Procedure: LOWER EXTREMITY ANGIOGRAM;  Surgeon: Serafina Mitchell, MD;  Location: [facility] CATH LAB;  Service: Cardiovascular;  Laterality: Right;  . NEPHRECTOMY Left 12/19/2001  . PERIPHERAL VASCULAR CATHETERIZATION N/A 02/03/2015   Procedure: Abdominal Aortogram;  Surgeon: Serafina Mitchell, MD;  Location: [facility] CV LAB;  Service: Cardiovascular;  Laterality: N/A;  . TRIGGER FINGER RELEASE Left ~ 1990's        Home Medications    Prior to Admission medications   Medication Sig Start Date End Date Taking? Authorizing Provider  albuterol (PROVENTIL HFA;VENTOLIN HFA) 108 (90 Base) MCG/ACT inhaler Inhale 2 puffs into the lungs every 4 (four) hours as needed for wheezing or shortness of breath.    [provider]  albuterol (PROVENTIL) (2.5 MG/3ML) 0.083% nebulizer solution Take 2.5 mg by nebulization every 6 (six) hours as needed for wheezing or shortness of breath.    [provider]  aspirin EC 81 MG tablet Take 81 mg by mouth at bedtime.    [provider]  cholecalciferol (VITAMIN D) 400 units TABS tablet Take 1,200 Units by mouth 2 (two) times daily.    [provider]  diclofenac sodium (VOLTAREN) 1 % GEL Apply 2 g topically 4 (four) times daily. 05/26/17   Elodia Florence., MD  doxycycline (VIBRAMYCIN) 100 MG capsule Take 1 capsule (100 mg total) by mouth 2 (two) times daily. One po bid x 7 days 06/03/18   Daleen Bo, MD  DULoxetine (CYMBALTA) 30 MG capsule Take 1 capsule (30 mg total) by mouth daily. 05/27/17 06/26/17  Elodia Florence., MD  gabapentin (NEURONTIN) 300 MG capsule Take 1 capsule (300 mg total) by mouth 2 (two) times daily. Pt takes two capsules in the morning and three at bedtime. Patient taking differently: Take 600-900 mg by mouth 2 (two) times daily. Pt takes two capsules in the morning and three at bedtime.  07/02/16   Verlee Monte, MD  Gauze Pads & Dressings (FOAM DRESSING) PADS 1 patch  by Does not apply route daily. 05/26/17   Elodia Florence., MD  Insulin Glargine (TOUJEO SOLOSTAR) 300 UNIT/ML SOPN Inject 5 Units into the skin daily as needed (for BS greater than 200).     [provider]  lidocaine (LIDODERM) 5 % Place 1 patch onto the skin daily. Remove & Discard patch within 12 hours or as directed by MD 05/26/17   Elodia Florence., MD  metFORMIN (GLUCOPHAGE) 500 MG tablet Take 500 mg by mouth 2 (two) times daily with a meal.    [provider]  mometasone-formoterol (DULERA) 200-5 MCG/ACT AERO Inhale 2 puffs into the lungs daily as needed for wheezing or shortness of breath.     [provider]  nicotine (NICODERM CQ - DOSED IN MG/24 HOURS) 21 mg/24hr patch Place 1 patch (  21 mg total) onto the skin daily. 05/27/17   Elodia Florence., MD  nitroGLYCERIN (NITROSTAT) 0.4 MG SL tablet Place 0.4 mg under the tongue every 5 (five) minutes as needed for chest pain.    [provider]  ondansetron (ZOFRAN ODT) 4 MG disintegrating tablet Take 1 tablet (4 mg total) by mouth every 8 (eight) hours as needed for nausea or vomiting. 05/09/17   Long, Wonda Olds, MD  Oxycodone HCl 20 MG TABS Take 1 tablet (20 mg total) by mouth every 6 (six) hours as needed. 05/26/17   Elodia Florence., MD  OXYGEN Inhale 6 L into the lungs daily as needed (for breathing).    [provider]  polyethylene glycol (MIRALAX) packet Take 17 g by mouth daily. 05/09/17   Long, Wonda Olds, MD  senna-docusate (SENOKOT-S) 8.6-50 MG tablet Take 1 tablet by mouth at bedtime as needed for mild constipation.    [provider]  Tamsulosin HCl (FLOMAX) 0.4 MG CAPS Take 0.4 mg by mouth 2 (two) times daily.     [provider]  traMADol (ULTRAM) 50 MG tablet Take 1 tablet (50 mg total) by mouth every 6 (six) hours as needed. Patient taking differently: Take 50 mg by mouth every 6 (six) hours as needed for moderate pain.  05/09/17   Long, Wonda Olds, MD    triazolam (HALCION) 0.25 MG tablet Take 0.25 mg by mouth at bedtime as needed for sleep.    [provider]  zolpidem (AMBIEN) 10 MG tablet Take 10 mg by mouth at bedtime as needed for sleep.    [provider]    Family History Family History  Problem Relation Age of Onset  . Cancer Mother   . Heart attack Father     Social History Social History   Tobacco Use  . Smoking status: Current Every Day Smoker    Packs/day: 1.50    Years: 54.00    Pack years: 81.00    Types: Cigarettes  . Smokeless tobacco: Never Used  Substance Use Topics  . Alcohol use: No  . Drug use: No     Allergies   Erythromycin; Iohexol; and Penicillins   Review of Systems Review of Systems  All other systems reviewed and are negative.    Physical Exam Updated Vital Signs BP (!) 144/85   Pulse 95   Temp 98 F (36.7 C) (Oral)   Resp (!) 22   Ht 6\' 3"  (1.905 m)   Wt 77.1 kg   SpO2 95%   BMI 21.25 kg/m   Physical Exam  Constitutional: He is oriented to person, place, and time. He appears well-developed and well-nourished. No distress.  HENT:  Head: Normocephalic and atraumatic.  Right Ear: External ear normal.  Left Ear: External ear normal.  Eyes: Pupils are equal, round, and reactive to light. Conjunctivae and EOM are normal.  Neck: Normal range of motion and phonation normal. Neck supple.  Cardiovascular: Normal rate, regular rhythm and normal heart sounds.  No murmur heard. Pulmonary/Chest: Effort normal and breath sounds normal. He exhibits no bony tenderness.  Abdominal: Soft. There is no tenderness.  Musculoskeletal: Normal range of motion.  Left leg AKA  Neurological: He is alert and oriented to person, place, and time. No cranial nerve deficit or sensory deficit. He exhibits normal muscle tone. Coordination normal.  Skin: Skin is warm, dry and intact.  Psychiatric: He has a normal mood and affect. His behavior is normal. Judgment and thought  content normal.   Nursing note and vitals reviewed.    ED Treatments / Results  Labs (all labs ordered are listed, but only abnormal results are displayed) Labs Reviewed  BASIC METABOLIC PANEL - Abnormal; Notable for the following components:      Result Value   Sodium 132 (*)    Chloride 96 (*)    Glucose, Bld 241 (*)    Creatinine, Ser 1.30 (*)    GFR calc non Af Amer 52 (*)    All other components within normal limits  TROPONIN I - Abnormal; Notable for the following components:   Troponin I 0.03 (*)    All other components within normal limits  GLUCOSE, CAPILLARY - Abnormal; Notable for the following components:   Glucose-Capillary 240 (*)    All other components within normal limits  URINALYSIS, ROUTINE W REFLEX MICROSCOPIC - Abnormal; Notable for the following components:   Color, Urine AMBER (*)    APPearance TURBID (*)    Protein, ur 100 (*)    Leukocytes, UA LARGE (*)    WBC, UA >50 (*)    All other components within normal limits  URINE CULTURE  CBC    EKG EKG Interpretation  Date/Time:  Sunday June 03 2018 17:54:47 EDT Ventricular Rate:  99 PR Interval:    QRS Duration: 104 QT Interval:  362 QTC Calculation: 464 R Axis:   -12 Text Interpretation:  Sinus rhythm with first degree AV block Low voltage QRS Septal infarct , age undetermined Abnormal ECG since last tracing no significant change Confirmed by Daleen Bo [phone]) on 06/03/2018 8:07:30 PM   Radiology Dg Chest 2 View  Result Date: 06/03/2018 CLINICAL DATA:  Chest pain EXAM: CHEST - 2 VIEW COMPARISON:  06/23/2017 FINDINGS: Post sternotomy changes. Streaky atelectasis or minimal infiltrate at the left base. No pleural effusion. Stable cardiomediastinal silhouette with aortic atherosclerosis. No pneumothorax. IMPRESSION: Minimal streaky atelectasis or infiltrate at the left lung base. Electronically Signed   By: Donavan Foil M.D.   On: 06/03/2018 19:20    Procedures Procedures (including critical care  time)  Medications Ordered in ED Medications  doxycycline (VIBRA-TABS) tablet 100 mg (100 mg Oral Given 06/03/18 2020)     Initial Impression / Assessment and Plan / ED Course  I have reviewed the triage vital signs and the nursing notes.  Pertinent labs & imaging results that were available during my care of the patient were reviewed by me and considered in my medical decision making (see chart for details).  Clinical Course as of Jun 03 2130  Nancy Fetter Jun 03, 2018  2005 Atelectasis versus infiltrate, left lower lobe, images reviewed by me  DG Chest 2 View [EW]    Clinical Course User Index [EW] Daleen Bo, MD     Patient Vitals for the past 24 hrs:  BP Temp Temp src Pulse Resp SpO2 Height Weight  06/03/18 2030 (!) 144/85 - - 95 (!) 22 95 % - -  06/03/18 2000 (!) 144/83 - - 96 11 95 % - -  06/03/18 1803 - - - - - - 6\' 3"  (1.905 m) 77.1 kg  06/03/18 1801 136/89 98 F (36.7 C) Oral 95 18 100 % - -    At discharge- reevaluation with update and discussion. After initial assessment and treatment, an updated evaluation reveals no change in clinical status, findings discussed with patient and family members, questions answered. Daleen Bo   Medical Decision Making: Nonspecific chest pain doubt ACS, PE or pneumonia.  Incidental finding for possible pneumonia left lower, and UTI.  Doubt sepsis.  CRITICAL CARE-no Performed by: Daleen Bo  Nursing Notes Reviewed/ Care Coordinated Applicable Imaging Reviewed Interpretation of Laboratory Data incorporated into ED treatment  The patient appears reasonably screened and/or stabilized for discharge and I doubt any other medical condition or other [facility] requiring further screening, evaluation, or treatment in the ED at this time prior to discharge.  Plan: Home Medications-continue usual medicines, Tylenol for fever or pain; Home Treatments-rest, fluids; return here if the recommended treatment, does not improve the symptoms; Recommended  follow up-PCP, PRN  Final Clinical Impressions(s) / ED Diagnoses   Final diagnoses:  Nonspecific chest pain  Community acquired pneumonia of left lower lobe of lung ([place])  Urinary tract infection without hematuria, site unspecified    ED Discharge Orders         Ordered    doxycycline (VIBRAMYCIN) 100 MG capsule  2 times daily     06/03/18 2100           Daleen Bo, MD 06/03/18 2133

## 2018-06-03 NOTE — ED Notes (Signed)
Critical Trop  0.03  Dr Eulis Foster informed

## 2018-06-03 NOTE — ED Triage Notes (Signed)
Patient has multiple complaints. Per patient c/o mid-sternal chest pain that started yesterday. Patient reports taking two SL nitro yesterday with relief. Per patient mild pain. Denies any radiation in pain. Patient has open heart surgery in 2007. Hx of COPD. Denies any increase in shortness of breath. Denies any nausea or vomiting. Patient is c/o pain in left temple that started this morning. Per patient checked blood pressure at home and it was 235/119 which is unusual for him. Denies any weakness, slurred speech, or facial drooping. Patient does state dizziness yesterday but none today. Patient also c/o dysuria and cloudy urine. Per patient only has one kidney. Denies any fevers.

## 2018-06-03 NOTE — ED Notes (Signed)
CRITICAL VALUE ALERT  Critical Value:  Trop 0.03  Date & Time Notied:  06/03/18 2014  Provider Notified: Dr Eulis Foster notified.  Orders Received/Actions taken: See chart

## 2018-06-03 NOTE — Discharge Instructions (Signed)
The testing today did not show any serious problems.  We are treating you for pneumonia and urinary tract infection.  They should improve with a single antibiotic which has been prescribed.  Make sure that you are getting plenty of rest and drink a lot of fluids.  Return here, if needed, for problems.

## 2018-06-08 LAB — URINE CULTURE: Culture: >=100000 — AB

## 2018-06-09 ENCOUNTER — Telehealth: Payer: Self-pay

## 2018-06-09 NOTE — Telephone Encounter (Signed)
Post ED Visit - Positive Culture Follow-up  Culture report reviewed by antimicrobial stewardship pharmacist:  []  Elenor Quinones, Pharm.D. []  Heide Guile, Pharm.D., BCPS AQ-ID []  Parks Neptune, Pharm.D., BCPS []  Alycia Rossetti, Pharm.D., BCPS []  Ferdinand, [place].D., BCPS, AAHIVP []  Legrand Como, Pharm.D., BCPS, AAHIVP []  Salome Arnt, PharmD, BCPS []  Johnnette Gourd, PharmD, BCPS [x]  Hughes Better, PharmD, BCPS []  Leeroy Cha, PharmD  Positive urine culture Treated with doxycycline, organism sensitive to the same and no further patient follow-up is required at this time.  Genia Del 06/09/2018, 10:18 AM

## 2018-07-25 DIAGNOSIS — E1165 Type 2 diabetes mellitus with hyperglycemia: Secondary | ICD-10-CM | POA: Diagnosis not present

## 2018-07-25 DIAGNOSIS — J449 Chronic obstructive pulmonary disease, unspecified: Secondary | ICD-10-CM | POA: Diagnosis not present

## 2018-07-25 DIAGNOSIS — I110 Hypertensive heart disease with heart failure: Secondary | ICD-10-CM | POA: Diagnosis not present

## 2018-08-23 DIAGNOSIS — E1165 Type 2 diabetes mellitus with hyperglycemia: Secondary | ICD-10-CM | POA: Diagnosis not present

## 2018-08-23 DIAGNOSIS — J449 Chronic obstructive pulmonary disease, unspecified: Secondary | ICD-10-CM | POA: Diagnosis not present

## 2018-08-23 DIAGNOSIS — K219 Gastro-esophageal reflux disease without esophagitis: Secondary | ICD-10-CM | POA: Diagnosis not present

## 2018-09-19 DIAGNOSIS — J449 Chronic obstructive pulmonary disease, unspecified: Secondary | ICD-10-CM | POA: Diagnosis not present

## 2018-10-04 ENCOUNTER — Emergency Department (HOSPITAL_COMMUNITY): Payer: Medicare HMO

## 2018-10-04 ENCOUNTER — Observation Stay (HOSPITAL_COMMUNITY)
Admission: EM | Admit: 2018-10-04 | Discharge: 2018-10-05 | Disposition: A | Discharge status: Home / Self Care | Payer: Medicare HMO | Attending: Internal Medicine | Admitting: Internal Medicine

## 2018-10-04 ENCOUNTER — Encounter (HOSPITAL_COMMUNITY): Payer: Self-pay | Admitting: Emergency Medicine

## 2018-10-04 DIAGNOSIS — Z951 Presence of aortocoronary bypass graft: Secondary | ICD-10-CM | POA: Diagnosis not present

## 2018-10-04 DIAGNOSIS — E7800 Pure hypercholesterolemia, unspecified: Secondary | ICD-10-CM | POA: Insufficient documentation

## 2018-10-04 DIAGNOSIS — N200 Calculus of kidney: Secondary | ICD-10-CM | POA: Diagnosis not present

## 2018-10-04 DIAGNOSIS — I739 Peripheral vascular disease, unspecified: Secondary | ICD-10-CM

## 2018-10-04 DIAGNOSIS — N183 Chronic kidney disease, stage 3 unspecified: Secondary | ICD-10-CM | POA: Diagnosis present

## 2018-10-04 DIAGNOSIS — I2510 Atherosclerotic heart disease of native coronary artery without angina pectoris: Secondary | ICD-10-CM | POA: Insufficient documentation

## 2018-10-04 DIAGNOSIS — R338 Other retention of urine: Secondary | ICD-10-CM | POA: Insufficient documentation

## 2018-10-04 DIAGNOSIS — Z9049 Acquired absence of other specified parts of digestive tract: Secondary | ICD-10-CM | POA: Diagnosis not present

## 2018-10-04 DIAGNOSIS — E46 Unspecified protein-calorie malnutrition: Secondary | ICD-10-CM | POA: Diagnosis not present

## 2018-10-04 DIAGNOSIS — Z881 Allergy status to other antibiotic agents status: Secondary | ICD-10-CM | POA: Insufficient documentation

## 2018-10-04 DIAGNOSIS — E1122 Type 2 diabetes mellitus with diabetic chronic kidney disease: Secondary | ICD-10-CM | POA: Diagnosis not present

## 2018-10-04 DIAGNOSIS — Z720 Tobacco use: Secondary | ICD-10-CM | POA: Diagnosis present

## 2018-10-04 DIAGNOSIS — N401 Enlarged prostate with lower urinary tract symptoms: Secondary | ICD-10-CM | POA: Diagnosis not present

## 2018-10-04 DIAGNOSIS — Z794 Long term (current) use of insulin: Secondary | ICD-10-CM | POA: Insufficient documentation

## 2018-10-04 DIAGNOSIS — E1140 Type 2 diabetes mellitus with diabetic neuropathy, unspecified: Secondary | ICD-10-CM | POA: Insufficient documentation

## 2018-10-04 DIAGNOSIS — Z905 Acquired absence of kidney: Secondary | ICD-10-CM | POA: Insufficient documentation

## 2018-10-04 DIAGNOSIS — R000 Tachycardia, unspecified: Secondary | ICD-10-CM | POA: Insufficient documentation

## 2018-10-04 DIAGNOSIS — Z89612 Acquired absence of left leg above knee: Secondary | ICD-10-CM | POA: Insufficient documentation

## 2018-10-04 DIAGNOSIS — E119 Type 2 diabetes mellitus without complications: Secondary | ICD-10-CM

## 2018-10-04 DIAGNOSIS — B9689 Other specified bacterial agents as the cause of diseases classified elsewhere: Secondary | ICD-10-CM | POA: Diagnosis not present

## 2018-10-04 DIAGNOSIS — Z6821 Body mass index (BMI) 21.0-21.9, adult: Secondary | ICD-10-CM | POA: Insufficient documentation

## 2018-10-04 DIAGNOSIS — E1151 Type 2 diabetes mellitus with diabetic peripheral angiopathy without gangrene: Secondary | ICD-10-CM | POA: Insufficient documentation

## 2018-10-04 DIAGNOSIS — Z8249 Family history of ischemic heart disease and other diseases of the circulatory system: Secondary | ICD-10-CM | POA: Insufficient documentation

## 2018-10-04 DIAGNOSIS — Z993 Dependence on wheelchair: Secondary | ICD-10-CM | POA: Insufficient documentation

## 2018-10-04 DIAGNOSIS — R9431 Abnormal electrocardiogram [ECG] [EKG]: Secondary | ICD-10-CM | POA: Diagnosis not present

## 2018-10-04 DIAGNOSIS — R0602 Shortness of breath: Secondary | ICD-10-CM | POA: Diagnosis not present

## 2018-10-04 DIAGNOSIS — N390 Urinary tract infection, site not specified: Secondary | ICD-10-CM | POA: Diagnosis present

## 2018-10-04 DIAGNOSIS — I10 Essential (primary) hypertension: Secondary | ICD-10-CM | POA: Diagnosis not present

## 2018-10-04 DIAGNOSIS — I130 Hypertensive heart and chronic kidney disease with heart failure and stage 1 through stage 4 chronic kidney disease, or unspecified chronic kidney disease: Secondary | ICD-10-CM | POA: Insufficient documentation

## 2018-10-04 DIAGNOSIS — J449 Chronic obstructive pulmonary disease, unspecified: Secondary | ICD-10-CM | POA: Diagnosis not present

## 2018-10-04 DIAGNOSIS — M109 Gout, unspecified: Secondary | ICD-10-CM | POA: Insufficient documentation

## 2018-10-04 DIAGNOSIS — N179 Acute kidney failure, unspecified: Principal | ICD-10-CM | POA: Diagnosis present

## 2018-10-04 DIAGNOSIS — I252 Old myocardial infarction: Secondary | ICD-10-CM | POA: Diagnosis not present

## 2018-10-04 DIAGNOSIS — Z79899 Other long term (current) drug therapy: Secondary | ICD-10-CM | POA: Insufficient documentation

## 2018-10-04 DIAGNOSIS — Z880 Allergy status to penicillin: Secondary | ICD-10-CM | POA: Insufficient documentation

## 2018-10-04 DIAGNOSIS — Z7982 Long term (current) use of aspirin: Secondary | ICD-10-CM | POA: Insufficient documentation

## 2018-10-04 DIAGNOSIS — R222 Localized swelling, mass and lump, trunk: Secondary | ICD-10-CM | POA: Diagnosis not present

## 2018-10-04 HISTORY — DX: Urinary tract infection, site not specified: N39.0

## 2018-10-04 LAB — URINALYSIS, ROUTINE W REFLEX MICROSCOPIC
GLUCOSE, UA: NEGATIVE mg/dL
Ketones, ur: 5 mg/dL — AB
Nitrite: POSITIVE — AB
Protein, ur: 100 mg/dL — AB
Specific Gravity, Urine: 1.025 (ref 1.005–1.030)
pH: 6 (ref 5.0–8.0)

## 2018-10-04 LAB — COMPREHENSIVE METABOLIC PANEL
ALBUMIN: 4.1 g/dL (ref 3.5–5.0)
ALT: 12 U/L (ref 0–44)
AST: 18 U/L (ref 15–41)
Alkaline Phosphatase: 56 U/L (ref 38–126)
Anion gap: 14 (ref 5–15)
BUN: 24 mg/dL — AB (ref 8–23)
CO2: 22 mmol/L (ref 22–32)
Calcium: 9.3 mg/dL (ref 8.9–10.3)
Chloride: 96 mmol/L — ABNORMAL LOW (ref 98–111)
Creatinine, Ser: 1.91 mg/dL — ABNORMAL HIGH (ref 0.61–1.24)
GFR calc Af Amer: 39 mL/min — ABNORMAL LOW (ref >60)
GFR calc non Af Amer: 34 mL/min — ABNORMAL LOW (ref >60)
GLUCOSE: 145 mg/dL — AB (ref 70–99)
POTASSIUM: 5.3 mmol/L — AB (ref 3.5–5.1)
SODIUM: 132 mmol/L — AB (ref 135–145)
Total Bilirubin: 0.4 mg/dL (ref 0.3–1.2)
Total Protein: 7.5 g/dL (ref 6.5–8.1)

## 2018-10-04 LAB — LACTIC ACID, PLASMA: LACTIC ACID, VENOUS: 2.8 mmol/L — AB (ref 0.5–1.9)

## 2018-10-04 LAB — CBG MONITORING, ED: Glucose-Capillary: 185 mg/dL — ABNORMAL HIGH (ref 70–99)

## 2018-10-04 LAB — CBC WITH DIFFERENTIAL/PLATELET
ABS IMMATURE GRANULOCYTES: 0.03 10*3/uL (ref 0.00–0.07)
Basophils Absolute: 0 10*3/uL (ref 0.0–0.1)
Basophils Relative: 0 %
Eosinophils Absolute: 0.1 10*3/uL (ref 0.0–0.5)
Eosinophils Relative: 1 %
HCT: 48.2 % (ref 39.0–52.0)
HEMOGLOBIN: 15.3 g/dL (ref 13.0–17.0)
IMMATURE GRANULOCYTES: 0 %
LYMPHS PCT: 21 %
Lymphs Abs: 1.8 10*3/uL (ref 0.7–4.0)
MCH: 30.1 pg (ref 26.0–34.0)
MCHC: 31.7 g/dL (ref 30.0–36.0)
MCV: 94.7 fL (ref 80.0–100.0)
MONO ABS: 0.6 10*3/uL (ref 0.1–1.0)
MONOS PCT: 7 %
NEUTROS ABS: 5.8 10*3/uL (ref 1.7–7.7)
Neutrophils Relative %: 71 %
Platelets: 256 10*3/uL (ref 150–400)
RBC: 5.09 MIL/uL (ref 4.22–5.81)
RDW: 14.2 % (ref 11.5–15.5)
WBC: 8.2 10*3/uL (ref 4.0–10.5)
nRBC: 0 % (ref 0.0–0.2)

## 2018-10-04 LAB — GLUCOSE, CAPILLARY
Glucose-Capillary: 107 mg/dL — ABNORMAL HIGH (ref 70–99)
Glucose-Capillary: 137 mg/dL — ABNORMAL HIGH (ref 70–99)

## 2018-10-04 MED ORDER — SODIUM CHLORIDE 0.9 % IV BOLUS
1000.0000 mL | Freq: Once | INTRAVENOUS | Status: AC
  Administered 2018-10-04: 1000 mL via INTRAVENOUS

## 2018-10-04 MED ORDER — QUETIAPINE FUMARATE 50 MG PO TABS
300.0000 mg | ORAL_TABLET | Freq: Every day | ORAL | Status: DC
  Administered 2018-10-04: 300 mg via ORAL
  Filled 2018-10-04: qty 6

## 2018-10-04 MED ORDER — INSULIN ASPART 100 UNIT/ML ~~LOC~~ SOLN: 0.0000 [IU] | Freq: Three times a day (TID) | SUBCUTANEOUS | Status: DC

## 2018-10-04 MED ORDER — SODIUM CHLORIDE 0.9 % IV SOLN
1.0000 g | Freq: Once | INTRAVENOUS | Status: AC
  Administered 2018-10-04: 1 g via INTRAVENOUS
  Filled 2018-10-04: qty 10

## 2018-10-04 MED ORDER — GABAPENTIN 300 MG PO CAPS
300.0000 mg | ORAL_CAPSULE | Freq: Two times a day (BID) | ORAL | Status: DC
  Administered 2018-10-04 – 2018-10-05 (×2): 300 mg via ORAL
  Filled 2018-10-04 (×2): qty 1

## 2018-10-04 MED ORDER — NICOTINE 14 MG/24HR TD PT24
14.0000 mg | MEDICATED_PATCH | Freq: Once | TRANSDERMAL | Status: DC
  Administered 2018-10-04: 14 mg via TRANSDERMAL
  Filled 2018-10-04: qty 1

## 2018-10-04 MED ORDER — TAMSULOSIN HCL 0.4 MG PO CAPS
0.4000 mg | ORAL_CAPSULE | Freq: Two times a day (BID) | ORAL | Status: DC
  Administered 2018-10-04 – 2018-10-05 (×2): 0.4 mg via ORAL
  Filled 2018-10-04 (×2): qty 1

## 2018-10-04 MED ORDER — SODIUM CHLORIDE 0.9% FLUSH: 3.0000 mL | Freq: Once | INTRAVENOUS | Status: DC

## 2018-10-04 MED ORDER — MOMETASONE FURO-FORMOTEROL FUM 200-5 MCG/ACT IN AERO
2.0000 | INHALATION_SPRAY | Freq: Two times a day (BID) | RESPIRATORY_TRACT | Status: DC
  Administered 2018-10-04 – 2018-10-05 (×2): 2 via RESPIRATORY_TRACT
  Filled 2018-10-04: qty 8.8

## 2018-10-04 MED ORDER — SODIUM CHLORIDE 0.9 % IV SOLN
INTRAVENOUS | Status: DC
  Administered 2018-10-04: 17:00:00 via INTRAVENOUS

## 2018-10-04 MED ORDER — LIDOCAINE HCL URETHRAL/MUCOSAL 2 % EX GEL
1.0000 "application " | Freq: Once | CUTANEOUS | Status: AC
  Administered 2018-10-04: 1
  Filled 2018-10-04: qty 20

## 2018-10-04 MED ORDER — INSULIN ASPART 100 UNIT/ML ~~LOC~~ SOLN: 0.0000 [IU] | Freq: Every day | SUBCUTANEOUS | Status: DC

## 2018-10-04 MED ORDER — MORPHINE SULFATE (PF) 4 MG/ML IV SOLN
4.0000 mg | Freq: Once | INTRAVENOUS | Status: AC
  Administered 2018-10-04: 4 mg via INTRAVENOUS
  Filled 2018-10-04: qty 1

## 2018-10-04 MED ORDER — SODIUM CHLORIDE 0.9 % IV SOLN: 1.0000 g | INTRAVENOUS | Status: DC

## 2018-10-04 MED ORDER — ENOXAPARIN SODIUM 30 MG/0.3ML ~~LOC~~ SOLN
30.0000 mg | SUBCUTANEOUS | Status: DC
  Administered 2018-10-04: 30 mg via SUBCUTANEOUS
  Filled 2018-10-04: qty 0.3

## 2018-10-04 MED ORDER — MORPHINE SULFATE (PF) 4 MG/ML IV SOLN: 4.0000 mg | Freq: Once | INTRAVENOUS | Status: DC

## 2018-10-04 MED ORDER — INSULIN GLARGINE 100 UNIT/ML ~~LOC~~ SOLN
5.0000 [IU] | Freq: Every day | SUBCUTANEOUS | Status: DC | PRN
  Filled 2018-10-04: qty 0.05

## 2018-10-04 MED ORDER — OXYCODONE HCL 5 MG PO TABS
20.0000 mg | ORAL_TABLET | Freq: Four times a day (QID) | ORAL | Status: DC | PRN
  Administered 2018-10-04: 20 mg via ORAL
  Filled 2018-10-04: qty 4

## 2018-10-04 MED ORDER — DICLOFENAC SODIUM 1 % TD GEL
2.0000 g | Freq: Four times a day (QID) | TRANSDERMAL | Status: DC
  Administered 2018-10-05: 2 g via TOPICAL
  Filled 2018-10-04: qty 100

## 2018-10-04 MED ORDER — ONDANSETRON HCL 4 MG/2ML IJ SOLN
4.0000 mg | Freq: Once | INTRAMUSCULAR | Status: AC
  Administered 2018-10-04: 4 mg via INTRAVENOUS
  Filled 2018-10-04: qty 2

## 2018-10-04 MED ORDER — ASPIRIN EC 81 MG PO TBEC
81.0000 mg | DELAYED_RELEASE_TABLET | Freq: Every day | ORAL | Status: DC
  Administered 2018-10-04: 81 mg via ORAL
  Filled 2018-10-04: qty 1

## 2018-10-04 MED ORDER — TRIAZOLAM 0.125 MG PO TABS
0.2500 mg | ORAL_TABLET | Freq: Every evening | ORAL | Status: DC | PRN
  Administered 2018-10-04: 0.25 mg via ORAL
  Filled 2018-10-04 (×2): qty 2

## 2018-10-04 MED ORDER — DULOXETINE HCL 30 MG PO CPEP
30.0000 mg | ORAL_CAPSULE | Freq: Every day | ORAL | Status: DC
  Administered 2018-10-04 – 2018-10-05 (×2): 30 mg via ORAL
  Filled 2018-10-04 (×2): qty 1

## 2018-10-04 NOTE — ED Triage Notes (Signed)
Unable to urinate- hx of nephrectomy left side with ureter cancer- hx of Right AKA - right side swelling over incision from nephrectomy >15 yrs.  Last urinated small amount this am.

## 2018-10-04 NOTE — H&P (Signed)
History and Physical  Patient Name: [name]     ULA:[id number]    DOB: 11/11/1940    DOA: 10/04/2018 PCP: Lucia Gaskins, MD  Patient coming from: Home  Chief Complaint: Dysuria, urinary urgency      HPI: [name] is a 78 y.o. M with hx ureteral cancer s/p left nephrectomy, CAD s/p CABG, IDDM, COPD not on home O2, PVD, L AKA in 2012, and CKD III baseline Cr 1.3 who presents with urinary urgency for 1 day.  The patient was in his usual state of health until about 3 weeks ago, he developed dysuria, urinary urgency, saw his PCP who prescribed him an antibiotic for UTI, which resolved the symptoms.  However in the last few days, the patient has had recurrence of urinary urgency, urge to go but nothing comes out.    He has had no fever and chills, but yesterday he had dry heaves and vomiting.  Today he had worsening lower abdominal pain, and continued worsening feeling of having to pee but not being able to, so he came to the emergency room.  ED course: - Afebrile, heart rate 82, respirations and pulse ox normal, blood pressure 120/83 -Na 132, K 5.3, Cr 1.9 (baseline 1.3), WBC 8.2K, Hgb 15.3 - Urinalysis with red blood cells and white blood cells in the urine - Lactic acid 2.8 -Chest x-ray showed emphysema, no focal airspace disease or opacity -CT of the abdomen and pelvis showed no hydronephrosis, but did show small nonobstructing stone - Bladder scan showed no retained urine, and Foley was inserted due to his feelings of urinary retention with only a few cc urine output. -He was given 1L NS, ceftriaxone and TRH were asked to evluate for AKI     ROS: Review of Systems  Constitutional: Negative for chills, diaphoresis, fever and malaise/fatigue.  Respiratory: Negative for cough, sputum production and shortness of breath.   Cardiovascular: Negative for chest pain, orthopnea and leg swelling.  Gastrointestinal: Positive for abdominal pain, nausea and vomiting.    Genitourinary: Positive for dysuria and urgency. Negative for flank pain, frequency and hematuria.  All other systems reviewed and are negative.         Past Medical History:  Diagnosis Date  . Anginal pain ([place])   . Arthritis    "qwhere" (07/09/2013)  . Asthma   . COPD (chronic obstructive pulmonary disease) ([place])    pt denies this hx on 07/09/2013  . Coronary artery disease   . Gout   . H/O hiatal hernia 1997  . Heart murmur    "had rheumatic fever as a kid" (07/09/2013)  . High cholesterol   . Hypertension   . Myocardial infarction ([place]) 03/2002; ~ 2011  . Neuropathy   . Pneumonia    "once" (07/09/2013)  . Renal disorder    "only have 1 kidney; due to go back soon to check the other one" (07/09/2013)  . Rheumatic fever   . RUPTURE ROTATOR CUFF 12/03/2009   Qualifier: Diagnosis of  By: Aline Brochure MD, Dorothyann Peng    . Shortness of breath    "can happen at anytime" (07/09/2013)  . Type II diabetes mellitus ([place])   . Ureter cancer [facility]) 12/2001   "shut down my kidney & resulted in nephrectomy" (07/09/2013)    Past Surgical History:  Procedure Laterality Date  . ABOVE KNEE LEG AMPUTATION Left 09/2009  . BELOW KNEE LEG AMPUTATION Left 08/2009  . CARDIAC CATHETERIZATION  ~ 2010   "before the bypass" (  07/09/2013)  . CARPAL TUNNEL RELEASE Bilateral ~2004; ~ 2011  . CHOLECYSTECTOMY  1980's  . CORONARY ARTERY BYPASS GRAFT  ~ 2010   "CABG X5" (07/09/2013)  . EYE SURGERY    . FEMORAL ARTERY STENT Right 07/09/2013  . GLAUCOMA SURGERY Bilateral ?[id number]  . LOWER EXTREMITY ANGIOGRAM Right Nov. 4, 2014  . LOWER EXTREMITY ANGIOGRAM Right 07/09/2013   Procedure: LOWER EXTREMITY ANGIOGRAM;  Surgeon: Serafina Mitchell, MD;  Location: [facility] CATH LAB;  Service: Cardiovascular;  Laterality: Right;  . NEPHRECTOMY Left 12/19/2001  . PERIPHERAL VASCULAR CATHETERIZATION N/A 02/03/2015   Procedure: Abdominal Aortogram;  Surgeon: Serafina Mitchell, MD;  Location: [facility] CV LAB;  Service: Cardiovascular;   Laterality: N/A;  . TRIGGER FINGER RELEASE Left ~ 1990's    Social History: Patient lives with family.  The patient is wheelchair bound.  Nonsmoker.  Allergies  Allergen Reactions  . Erythromycin Anaphylaxis and Rash  . Iohexol Anaphylaxis, Hives, Itching and Other (See Comments)    Note:  13 HR PRE-MED GIVEN AND SUCCESSFUL-ARS 07/15/07.     Marland Kitchen Penicillins Anaphylaxis, Itching and Other (See Comments)    Has patient had a PCN reaction causing immediate rash, facial/tongue/throat swelling, SOB or lightheadedness with hypotension: Yes Has patient had a PCN reaction causing severe rash involving mucus membranes or skin necrosis: No Has patient had a PCN reaction that required hospitalization No Has patient had a PCN reaction occurring within the last 10 years: No If all of the above answers are "NO", then may proceed with Cephalosporin use.    Family history: family history includes Cancer in his mother; Heart attack in his father.  Prior to Admission medications   Medication Sig Start Date End Date Taking? Authorizing Provider  albuterol (PROVENTIL HFA;VENTOLIN HFA) 108 (90 Base) MCG/ACT inhaler Inhale 2 puffs into the lungs every 4 (four) hours as needed for wheezing or shortness of breath.   Yes [provider]  albuterol (PROVENTIL) (2.5 MG/3ML) 0.083% nebulizer solution Take 2.5 mg by nebulization every 6 (six) hours as needed for wheezing or shortness of breath.   Yes [provider]  aspirin EC 81 MG tablet Take 81 mg by mouth at bedtime.   Yes [provider]  cholecalciferol (VITAMIN D) 400 units TABS tablet Take 1,200 Units by mouth 2 (two) times daily.   Yes [provider]  gabapentin (NEURONTIN) 300 MG capsule Take 1 capsule (300 mg total) by mouth 2 (two) times daily. Pt takes two capsules in the morning and three at bedtime. Patient taking differently: Take 600-900 mg by mouth See admin instructions. Pt takes two capsules in the morning and  three at bedtime. 07/02/16  Yes Verlee Monte, MD  Insulin Glargine (TOUJEO SOLOSTAR) 300 UNIT/ML SOPN Inject 5 Units into the skin daily as needed (for BS greater than 200).    Yes [provider]  metFORMIN (GLUCOPHAGE) 500 MG tablet Take 500 mg by mouth 2 (two) times daily with a meal.   Yes [provider]  nitroGLYCERIN (NITROSTAT) 0.4 MG SL tablet Place 0.4 mg under the tongue every 5 (five) minutes as needed for chest pain.   Yes [provider]  QUEtiapine (SEROQUEL) 300 MG tablet Take 300 mg by mouth at bedtime. 09/10/18  Yes [provider]  Tamsulosin HCl (FLOMAX) 0.4 MG CAPS Take 0.4 mg by mouth 2 (two) times daily.    Yes [provider]  triazolam (HALCION) 0.25 MG tablet Take 0.5 mg by mouth at bedtime  as needed for sleep.    Yes [provider]  diclofenac sodium (VOLTAREN) 1 % GEL Apply 2 g topically 4 (four) times daily. Patient not taking: Reported on 10/04/2018 05/26/17   Elodia Florence., MD  doxycycline (VIBRAMYCIN) 100 MG capsule Take 1 capsule (100 mg total) by mouth 2 (two) times daily. One po bid x 7 days Patient not taking: Reported on 10/04/2018 06/03/18   Daleen Bo, MD  DULoxetine (CYMBALTA) 30 MG capsule Take 1 capsule (30 mg total) by mouth daily. 05/27/17 06/26/17  Elodia Florence., MD  Gauze Pads & Dressings (FOAM DRESSING) PADS 1 patch by Does not apply route daily. 05/26/17   Elodia Florence., MD  lidocaine (LIDODERM) 5 % Place 1 patch onto the skin daily. Remove & Discard patch within 12 hours or as directed by MD Patient not taking: Reported on 10/04/2018 05/26/17   Elodia Florence., MD  nicotine (NICODERM CQ - DOSED IN MG/24 HOURS) 21 mg/24hr patch Place 1 patch (21 mg total) onto the skin daily. 05/27/17   Elodia Florence., MD  ondansetron (ZOFRAN ODT) 4 MG disintegrating tablet Take 1 tablet (4 mg total) by mouth every 8 (eight) hours as needed for nausea or vomiting. Patient not  taking: Reported on 10/04/2018 05/09/17   Long, Wonda Olds, MD  Oxycodone HCl 20 MG TABS Take 1 tablet (20 mg total) by mouth every 6 (six) hours as needed. Patient not taking: Reported on 10/04/2018 05/26/17   Elodia Florence., MD  OXYGEN Inhale 6 L into the lungs daily as needed (for breathing).    [provider]  polyethylene glycol (MIRALAX) packet Take 17 g by mouth daily. Patient not taking: Reported on 10/04/2018 05/09/17   Long, Wonda Olds, MD  traMADol (ULTRAM) 50 MG tablet Take 1 tablet (50 mg total) by mouth every 6 (six) hours as needed. Patient not taking: Reported on 10/04/2018 05/09/17   Long, Wonda Olds, MD       Physical Exam: BP (!) 141/74 (BP Location: Left Arm)   Pulse (!) 130   Temp 98.2 F (36.8 C) (Oral)   Resp 20   Ht 6\' 3"  (1.905 m)   SpO2 95%   BMI 21.25 kg/m  General appearance: Elderly adult male, alert and in no acute distress.   Eyes: Anicteric, conjunctiva pink, lids and lashes normal. PERRL.    ENT: No nasal deformity, discharge, epistaxis.  Hearing normal. OP moist without lesions.  Edentulous, normal lips Neck: No neck masses.  Trachea midline.  No thyromegaly/tenderness. Lymph: No cervical or supraclavicular lymphadenopathy. Skin: Warm and dry.  No jaundice.  No suspicious rashes or lesions. Cardiac: RRR, nl S1-S2, no murmurs appreciated.  Capillary refill is brisk.  JVP not visible.  No LE edema.  Radial pulses 2+ and symmetric. Respiratory: Normal respiratory rate and rhythm.  CTAB without rales or wheezes. Abdomen: Abdomen soft.  Mild suprapubci and left sided TTP with voluntary gaurding, no rebound or rigidity.  Moderate sized left abdominal hernia. No ascites, distension, hepatosplenomegaly.   MSK: No deformities or effusions of the large joints of the upper or lower extremities bilaterally.  No cyanosis or clubbing.  Left AKA. Neuro: Cranial nerves normal.  Sensation intact to light touch. Speech is fluent.  Muscle strength normal.    Psych:  Sensorium intact and responding to questions, attention normal.  Behavior appropriate.  Affect normal.  Judgment and insight appear normal.     Labs on  Admission:  I have personally reviewed following labs and imaging studies: CBC: Recent Labs  Lab 10/04/18 1246  WBC 8.2  NEUTROABS 5.8  HGB 15.3  HCT 48.2  MCV 94.7  PLT 762   Basic Metabolic Panel: Recent Labs  Lab 10/04/18 1246  NA 132*  K 5.3*  CL 96*  CO2 22  GLUCOSE 145*  BUN 24*  CREATININE 1.91*  CALCIUM 9.3   GFR: CrCl cannot be calculated (Unknown ideal weight.).  Liver Function Tests: Recent Labs  Lab 10/04/18 1246  AST 18  ALT 12  ALKPHOS 56  BILITOT 0.4  PROT 7.5  ALBUMIN 4.1   No results for input(s): LIPASE, AMYLASE in the last 168 hours. No results for input(s): AMMONIA in the last 168 hours. Coagulation Profile: No results for input(s): INR, PROTIME in the last 168 hours. Cardiac Enzymes: No results for input(s): CKTOTAL, CKMB, CKMBINDEX, TROPONINI in the last 168 hours. BNP (last 3 results) No results for input(s): PROBNP in the last 8760 hours. HbA1C: No results for input(s): HGBA1C in the last 72 hours. CBG: Recent Labs  Lab 10/04/18 1207 10/04/18 1729  GLUCAP 185* 107*   Lipid Profile: No results for input(s): CHOL, HDL, LDLCALC, TRIG, CHOLHDL, LDLDIRECT in the last 72 hours. Thyroid Function Tests: No results for input(s): TSH, T4TOTAL, FREET4, T3FREE, THYROIDAB in the last 72 hours. Anemia Panel: No results for input(s): VITAMINB12, FOLATE, FERRITIN, TIBC, IRON, RETICCTPCT in the last 72 hours. Sepsis Labs: Lactic acid elevated  No results found for this or any previous visit (from the past 240 hour(s)).         Radiological Exams on Admission: Personally reviewed CXR shows emphysema but no focal airspace disease or opacity; CT abndomen report reviewed: Dg Chest 2 View  Result Date: 10/04/2018 CLINICAL DATA:  Initial evaluation for acute swelling, shortness of breath.  EXAM: CHEST - 2 VIEW COMPARISON:  Prior radiograph from 06/03/2018 FINDINGS: Median sternotomy wires underlying surgical clips noted. Underlying mild cardiomegaly, stable. Mediastinal silhouette normal. Aortic atherosclerosis. Lungs mildly hyperinflated. Elevation of the left hemidiaphragm with associated left basilar subsegmental atelectasis and/or scarring, similar to previous. Mild right basilar atelectasis. Underlying COPD. No focal infiltrates. No pulmonary edema or pleural effusion. The no pneumothorax. No acute osseous abnormality. IMPRESSION: 1. COPD with superimposed bibasilar subsegmental atelectasis and/or scarring. 2. No other active cardiopulmonary disease. 3. Aortic atherosclerosis. Electronically Signed   By: Jeannine Boga M.D.   On: 10/04/2018 13:27   Ct Renal Stone Study  Result Date: 10/04/2018 CLINICAL DATA:  78 year old male with urinary retention and abdominal pain. History of prior LEFT nephrectomy. EXAM: CT ABDOMEN AND PELVIS WITHOUT CONTRAST TECHNIQUE: Multidetector CT imaging of the abdomen and pelvis was performed following the standard protocol without IV contrast. COMPARISON:  05/09/2017 and prior CTs FINDINGS: Please note that parenchymal abnormalities may be missed without intravenous contrast. Lower chest: No acute abnormality. Hepatobiliary: The liver is unremarkable. The patient is status post cholecystectomy. No biliary dilatation. Pancreas: Unremarkable Spleen: Unremarkable Adrenals/Urinary Tract: Patient is status post LEFT nephrectomy. No abnormalities are identified in the nephrectomy bed. The RIGHT kidney and adrenal glands are unremarkable except for a punctate nonobstructing mid RIGHT renal calculus. There is no evidence of RIGHT hydronephrosis. A Foley catheter is present within the bladder. Stomach/Bowel: Stomach is within normal limits. Appendix appears normal. No evidence of bowel wall thickening, distention, or inflammatory changes. Vascular/Lymphatic: Aortic  atherosclerosis. No enlarged abdominal or pelvic lymph nodes. Reproductive: Unchanged prostate. Other: No ascites, focal collection or  pneumoperitoneum. Musculoskeletal: No acute or suspicious bony abnormalities are identified. Remote L1 compression fracture and vertebral augmentation changes again noted. Mild SUPERIOR endplate compression of T11 and T12 again noted. IMPRESSION: 1. No acute abnormality. 2. Punctate nonobstructing RIGHT renal calculus. No evidence of RIGHT hydronephrosis. Foley catheter within the bladder. 3. Remote LOWER thoracic and L1 compression fractures and L1 vertebral augmentation changes. 4.  Aortic Atherosclerosis (ICD10-I70.0). Electronically Signed   By: Margarette Canada M.D.   On: 10/04/2018 15:17    EKG: Independently reviewed. Rate 112, sinus tachycardia, not junctional rhythm as noted.          Assessment/Plan  Acute kidney injury on CKD stage III In light of his single kidney, I feel observation overnight is warranted for IV fluids and trending his creatinine. -IV fluids -Repeat Cr in morning   UTI No leukocytosis or fever. -Obtain urine culture -Ceftriaxone in hospital, plan for discharge with cephalexin or cefpodoxime tomorrow -Will need close follow up with his Urologist Dr. Noah Delaine after discharge, this was emphasized  Peripheral vascular disease secondary prevention Hypertension -Continue aspirin  Diabetes -Continue gabapentin, duloxetine for neuropathy -SSI with meals  Other medications -Continue Flomax  COPD No active disease -Continue Dulera      DVT prophylaxis: Lovenox  Code Status: FULL  Family Communication: None present  Disposition Plan: Anticipate IV fluids overnight.  If Cr improving tomrorow, will plan for discharge with close Urology follow up, oral antibiotics for UTI and pushing fluids. Consults called: None Admission status: OBS   At the point of initial evaluation, it is my clinical opinion that admission for  OBSERVATION is reasonable and necessary because the patient's presenting complaints in the context of their chronic conditions represent sufficient risk of deterioration or significant morbidity to constitute reasonable grounds for close observation in the hospital setting, but that the patient may be medically stable for discharge from the hospital within 24 to 48 hours.    Medical decision making: Patient seen at 4:40 PM on 10/04/2018.  The patient was discussed with Domenic Moras, PA-C.  What exists of the patient's chart was reviewed in depth and summarized above.  Clinical condition: stable.        [facility] Pager: [email or web address] page via [email or web address]

## 2018-10-04 NOTE — ED Notes (Signed)
Pain and swelling to left side and difficulty voiding

## 2018-10-04 NOTE — ED Provider Notes (Signed)
[facility] EMERGENCY DEPARTMENT Provider Note   CSN: [id number] Arrival date & time: 10/04/18  1202     History   Chief Complaint No chief complaint on file.   HPI [name] is a 78 y.o. male.  The history is provided by the patient and medical records. No language interpreter was used.     78 year old male with significant history of ureteral cancer status post left nephrectomy presenting for evaluation of difficulty urinating.  Patient states he had a left nephrectomy many years ago.  For the past 2 weeks he noticed increasing discomfort to his left abdomen and left flank area where he had a hernia which has become increasingly large.  Last night he was having difficulty urinating only able to dribble several drops of urine with blood in it.  Throughout the day today he was unable to urinate.  He complaining of pressure sensation to his bladder region, rates a 6 out of 10, nonradiating.  He endorsed nausea and dry heaving.  He does not complain of any fever chills, no chest pain or shortness of breath, no bowel bladder changes.  Report having a urinary tract infection several weeks prior and was on antibiotic.  No recent surgery.  Past Medical History:  Diagnosis Date  . Anginal pain ([place])   . Arthritis    "qwhere" (07/09/2013)  . Asthma   . COPD (chronic obstructive pulmonary disease) ([place])    pt denies this hx on 07/09/2013  . Coronary artery disease   . Gout   . H/O hiatal hernia 1997  . Heart murmur    "had rheumatic fever as a kid" (07/09/2013)  . High cholesterol   . Hypertension   . Myocardial infarction ([place]) 03/2002; ~ 2011  . Neuropathy   . Pneumonia    "once" (07/09/2013)  . Renal disorder    "only have 1 kidney; due to go back soon to check the other one" (07/09/2013)  . Rheumatic fever   . RUPTURE ROTATOR CUFF 12/03/2009   Qualifier: Diagnosis of  By: Aline Brochure MD, Dorothyann Peng    . Shortness of breath    "can happen at anytime" (07/09/2013)    . Type II diabetes mellitus ([place])   . Ureter cancer [facility]) 12/2001   "shut down my kidney & resulted in nephrectomy" (07/09/2013)    Patient Active Problem List   Diagnosis Date Noted  . Intractable vomiting 06/03/2017  . Intractable nausea and vomiting 06/03/2017  . Constipation 06/03/2017  . Pressure injury of skin 05/25/2017  . PAD (peripheral artery disease) ([place]) 05/24/2017  . CKD (chronic kidney disease), stage III ([place]) 06/29/2016  . COPD exacerbation ([place]) 06/16/2016  . Hyperkalemia 11/24/2015  . UTI (lower urinary tract infection) 11/24/2015  . Acute UTI 09/11/2014  . Acute respiratory failure with hypercapnia ([place]) 09/10/2014  . Acute encephalopathy 09/10/2014  . Alcoholic dementia ([place]) 50/05/3817  . Recurrent falls 09/10/2014  . Left shoulder pain 09/10/2014  . CAD in native artery 09/10/2014  . Diabetes mellitus with peripheral vascular disease ([place]) 09/10/2014  . FTT (failure to thrive) in adult 09/10/2014  . Protein-calorie malnutrition ([place]) 09/10/2014  . Mixed acid base balance disorder 09/09/2014  . Acute kidney injury ([place]) 09/09/2014  . Hypotension 09/09/2014  . Fall 08/27/2014  . Altered mental status 08/26/2014  . Dehydration 08/26/2014  . Alcohol abuse 08/26/2014  . Hypertension 08/26/2014  . Aftercare following surgery of the circulatory system, [place] 08/05/2013  . Leg pain, left 07/01/2013  . Peripheral  vascular disease ([place]) 05/27/2013  . Peripheral angiopathy in diseases classified elsewhere ([place]) 05/02/2012  . Diabetes ([place]) 05/02/2012  . Pain in limb 05/02/2012  . Chronic total occlusion of artery of the extremities ([place]) 05/02/2012  . JOINT EFFUSION, KNEE 12/03/2009  . RUPTURE ROTATOR CUFF 12/03/2009  . ISCHEMIC CARDIOMYOPATHY 08/24/2009  . CHF 08/24/2009  . BACK PAIN, LUMBAR, CHRONIC 08/24/2009    Past Surgical History:  Procedure Laterality Date  . ABOVE KNEE LEG AMPUTATION Left 09/2009  . BELOW KNEE LEG AMPUTATION Left 08/2009  .  CARDIAC CATHETERIZATION  ~ 2010   "before the bypass" (07/09/2013)  . CARPAL TUNNEL RELEASE Bilateral ~2004; ~ 2011  . CHOLECYSTECTOMY  1980's  . CORONARY ARTERY BYPASS GRAFT  ~ 2010   "CABG X5" (07/09/2013)  . EYE SURGERY    . FEMORAL ARTERY STENT Right 07/09/2013  . GLAUCOMA SURGERY Bilateral ?[id number]  . LOWER EXTREMITY ANGIOGRAM Right Nov. 4, 2014  . LOWER EXTREMITY ANGIOGRAM Right 07/09/2013   Procedure: LOWER EXTREMITY ANGIOGRAM;  Surgeon: Serafina Mitchell, MD;  Location: [facility] CATH LAB;  Service: Cardiovascular;  Laterality: Right;  . NEPHRECTOMY Left 12/19/2001  . PERIPHERAL VASCULAR CATHETERIZATION N/A 02/03/2015   Procedure: Abdominal Aortogram;  Surgeon: Serafina Mitchell, MD;  Location: [facility] CV LAB;  Service: Cardiovascular;  Laterality: N/A;  . TRIGGER FINGER RELEASE Left ~ 1990's        Home Medications    Prior to Admission medications   Medication Sig Start Date End Date Taking? Authorizing Provider  albuterol (PROVENTIL HFA;VENTOLIN HFA) 108 (90 Base) MCG/ACT inhaler Inhale 2 puffs into the lungs every 4 (four) hours as needed for wheezing or shortness of breath.    [provider]  albuterol (PROVENTIL) (2.5 MG/3ML) 0.083% nebulizer solution Take 2.5 mg by nebulization every 6 (six) hours as needed for wheezing or shortness of breath.    [provider]  aspirin EC 81 MG tablet Take 81 mg by mouth at bedtime.    [provider]  cholecalciferol (VITAMIN D) 400 units TABS tablet Take 1,200 Units by mouth 2 (two) times daily.    [provider]  diclofenac sodium (VOLTAREN) 1 % GEL Apply 2 g topically 4 (four) times daily. 05/26/17   Elodia Florence., MD  doxycycline (VIBRAMYCIN) 100 MG capsule Take 1 capsule (100 mg total) by mouth 2 (two) times daily. One po bid x 7 days 06/03/18   Daleen Bo, MD  DULoxetine (CYMBALTA) 30 MG capsule Take 1 capsule (30 mg total) by mouth daily. 05/27/17 06/26/17  Elodia Florence., MD   gabapentin (NEURONTIN) 300 MG capsule Take 1 capsule (300 mg total) by mouth 2 (two) times daily. Pt takes two capsules in the morning and three at bedtime. Patient taking differently: Take 600-900 mg by mouth 2 (two) times daily. Pt takes two capsules in the morning and three at bedtime.  07/02/16   Verlee Monte, MD  Gauze Pads & Dressings (FOAM DRESSING) PADS 1 patch by Does not apply route daily. 05/26/17   Elodia Florence., MD  Insulin Glargine (TOUJEO SOLOSTAR) 300 UNIT/ML SOPN Inject 5 Units into the skin daily as needed (for BS greater than 200).     [provider]  lidocaine (LIDODERM) 5 % Place 1 patch onto the skin daily. Remove & Discard patch within 12 hours or as directed by MD 05/26/17   Elodia Florence., MD  metFORMIN (GLUCOPHAGE) 500 MG tablet Take 500 mg by mouth  2 (two) times daily with a meal.    [provider]  mometasone-formoterol (DULERA) 200-5 MCG/ACT AERO Inhale 2 puffs into the lungs daily as needed for wheezing or shortness of breath.     [provider]  nicotine (NICODERM CQ - DOSED IN MG/24 HOURS) 21 mg/24hr patch Place 1 patch (21 mg total) onto the skin daily. 05/27/17   Elodia Florence., MD  nitroGLYCERIN (NITROSTAT) 0.4 MG SL tablet Place 0.4 mg under the tongue every 5 (five) minutes as needed for chest pain.    [provider]  ondansetron (ZOFRAN ODT) 4 MG disintegrating tablet Take 1 tablet (4 mg total) by mouth every 8 (eight) hours as needed for nausea or vomiting. 05/09/17   Long, Wonda Olds, MD  Oxycodone HCl 20 MG TABS Take 1 tablet (20 mg total) by mouth every 6 (six) hours as needed. 05/26/17   Elodia Florence., MD  OXYGEN Inhale 6 L into the lungs daily as needed (for breathing).    [provider]  polyethylene glycol (MIRALAX) packet Take 17 g by mouth daily. 05/09/17   Long, Wonda Olds, MD  senna-docusate (SENOKOT-S) 8.6-50 MG tablet Take 1 tablet by mouth at bedtime as needed for mild  constipation.    [provider]  Tamsulosin HCl (FLOMAX) 0.4 MG CAPS Take 0.4 mg by mouth 2 (two) times daily.     [provider]  traMADol (ULTRAM) 50 MG tablet Take 1 tablet (50 mg total) by mouth every 6 (six) hours as needed. Patient taking differently: Take 50 mg by mouth every 6 (six) hours as needed for moderate pain.  05/09/17   Long, Wonda Olds, MD  triazolam (HALCION) 0.25 MG tablet Take 0.25 mg by mouth at bedtime as needed for sleep.    [provider]  zolpidem (AMBIEN) 10 MG tablet Take 10 mg by mouth at bedtime as needed for sleep.    [provider]    Family History Family History  Problem Relation Age of Onset  . Cancer Mother   . Heart attack Father     Social History Social History   Tobacco Use  . Smoking status: Current Every Day Smoker    Packs/day: 1.50    Years: 54.00    Pack years: 81.00    Types: Cigarettes  . Smokeless tobacco: Never Used  Substance Use Topics  . Alcohol use: No  . Drug use: No     Allergies   Erythromycin; Iohexol; and Penicillins   Review of Systems Review of Systems  All other systems reviewed and are negative.    Physical Exam Updated Vital Signs BP (!) 160/64 (BP Location: Left Arm)   Pulse 96   Temp 97.6 F (36.4 C) (Oral)   Resp 14   SpO2 94%   Physical Exam Vitals signs and nursing note reviewed.  Constitutional:      General: He is not in acute distress.    Appearance: He is well-developed.     Comments: Appears mildly uncomfortable but in no acute distress.  HENT:     Head: Atraumatic.  Eyes:     Conjunctiva/sclera: Conjunctivae normal.  Neck:     Musculoskeletal: Neck supple.  Cardiovascular:     Rate and Rhythm: Normal rate and regular rhythm.  Pulmonary:     Effort: Pulmonary effort is normal.     Breath sounds: Normal breath sounds.  Abdominal:     Palpations: Abdomen is soft.  Tenderness: There is abdominal tenderness (Tenderness to suprapubic region  with mildly distended bladder).     Comments: Large hernia defect noted to left lower lateral abdomen tender to palpation.  Genitourinary:    Comments: Chaperone present during exam.  Normal uncircumcised penis free of lesion or rash.  Testicles are nontender with normal lie.  Normal scrotum, no evidence of Fournier gangrene.  No inguinal lymphadenopathy or inguinal hernia noted. Musculoskeletal:     Comments: Left AKA.  Skin:    Findings: No rash.  Neurological:     Mental Status: He is alert and oriented to person, place, and time.      ED Treatments / Results  Labs (all labs ordered are listed, but only abnormal results are displayed) Labs Reviewed  LACTIC ACID, PLASMA - Abnormal; Notable for the following components:      Result Value   Lactic Acid, Venous 2.8 (*)    All other components within normal limits  COMPREHENSIVE METABOLIC PANEL - Abnormal; Notable for the following components:   Sodium 132 (*)    Potassium 5.3 (*)    Chloride 96 (*)    Glucose, Bld 145 (*)    BUN 24 (*)    Creatinine, Ser 1.91 (*)    GFR calc non Af Amer 34 (*)    GFR calc Af Amer 39 (*)    All other components within normal limits  URINALYSIS, ROUTINE W REFLEX MICROSCOPIC - Abnormal; Notable for the following components:   Color, Urine AMBER (*)    APPearance CLOUDY (*)    Hgb urine dipstick LARGE (*)    Bilirubin Urine SMALL (*)    Ketones, ur 5 (*)    Protein, ur 100 (*)    Nitrite POSITIVE (*)    Leukocytes, UA LARGE (*)    RBC / HPF >50 (*)    WBC, UA >50 (*)    Bacteria, UA FEW (*)    All other components within normal limits  CBG MONITORING, ED - Abnormal; Notable for the following components:   Glucose-Capillary 185 (*)    All other components within normal limits  URINE CULTURE  CBC WITH DIFFERENTIAL/PLATELET    EKG EKG Interpretation  Date/Time:  Thursday October 04 2018 12:12:18 EST Ventricular Rate:  112 PR Interval:    QRS Duration: 108 QT Interval:  326 QTC  Calculation: 444 R Axis:   98 Text Interpretation:  Accelerated Junctional rhythm Low voltage QRS Septal infarct , age undetermined Lateral infarct , age undetermined ST & T wave abnormality, consider inferior ischemia Abnormal ECG Confirmed by Dene Gentry [phone]) on 10/04/2018 12:22:02 PM   Radiology Dg Chest 2 View  Result Date: 10/04/2018 CLINICAL DATA:  Initial evaluation for acute swelling, shortness of breath. EXAM: CHEST - 2 VIEW COMPARISON:  Prior radiograph from 06/03/2018 FINDINGS: Median sternotomy wires underlying surgical clips noted. Underlying mild cardiomegaly, stable. Mediastinal silhouette normal. Aortic atherosclerosis. Lungs mildly hyperinflated. Elevation of the left hemidiaphragm with associated left basilar subsegmental atelectasis and/or scarring, similar to previous. Mild right basilar atelectasis. Underlying COPD. No focal infiltrates. No pulmonary edema or pleural effusion. The no pneumothorax. No acute osseous abnormality. IMPRESSION: 1. COPD with superimposed bibasilar subsegmental atelectasis and/or scarring. 2. No other active cardiopulmonary disease. 3. Aortic atherosclerosis. Electronically Signed   By: Jeannine Boga M.D.   On: 10/04/2018 13:27   Ct Renal Stone Study  Result Date: 10/04/2018 CLINICAL DATA:  78 year old male with urinary retention and abdominal pain. History of prior LEFT nephrectomy. EXAM:  CT ABDOMEN AND PELVIS WITHOUT CONTRAST TECHNIQUE: Multidetector CT imaging of the abdomen and pelvis was performed following the standard protocol without IV contrast. COMPARISON:  05/09/2017 and prior CTs FINDINGS: Please note that parenchymal abnormalities may be missed without intravenous contrast. Lower chest: No acute abnormality. Hepatobiliary: The liver is unremarkable. The patient is status post cholecystectomy. No biliary dilatation. Pancreas: Unremarkable Spleen: Unremarkable Adrenals/Urinary Tract: Patient is status post LEFT nephrectomy. No  abnormalities are identified in the nephrectomy bed. The RIGHT kidney and adrenal glands are unremarkable except for a punctate nonobstructing mid RIGHT renal calculus. There is no evidence of RIGHT hydronephrosis. A Foley catheter is present within the bladder. Stomach/Bowel: Stomach is within normal limits. Appendix appears normal. No evidence of bowel wall thickening, distention, or inflammatory changes. Vascular/Lymphatic: Aortic atherosclerosis. No enlarged abdominal or pelvic lymph nodes. Reproductive: Unchanged prostate. Other: No ascites, focal collection or pneumoperitoneum. Musculoskeletal: No acute or suspicious bony abnormalities are identified. Remote L1 compression fracture and vertebral augmentation changes again noted. Mild SUPERIOR endplate compression of T11 and T12 again noted. IMPRESSION: 1. No acute abnormality. 2. Punctate nonobstructing RIGHT renal calculus. No evidence of RIGHT hydronephrosis. Foley catheter within the bladder. 3. Remote LOWER thoracic and L1 compression fractures and L1 vertebral augmentation changes. 4.  Aortic Atherosclerosis (ICD10-I70.0). Electronically Signed   By: Margarette Canada M.D.   On: 10/04/2018 15:17    Procedures Procedures (including critical care time)  Medications Ordered in ED Medications  sodium chloride flush (NS) 0.9 % injection 3 mL (3 mLs Intravenous Not Given 10/04/18 1528)  sodium chloride 0.9 % bolus 1,000 mL (1,000 mLs Intravenous New Bag/Given 10/04/18 1551)  cefTRIAXone (ROCEPHIN) 1 g in sodium chloride 0.9 % 100 mL IVPB (1 g Intravenous New Bag/Given 10/04/18 1551)  nicotine (NICODERM CQ - dosed in mg/24 hours) patch 14 mg (has no administration in time range)  morphine 4 MG/ML injection 4 mg (4 mg Intravenous Given 10/04/18 1354)  ondansetron (ZOFRAN) injection 4 mg (4 mg Intravenous Given 10/04/18 1354)  lidocaine (XYLOCAINE) 2 % jelly 1 application (1 application Other Given 10/04/18 1428)     Initial Impression / Assessment and Plan  / ED Course  I have reviewed the triage vital signs and the nursing notes.  Pertinent labs & imaging results that were available during my care of the patient were reviewed by me and considered in my medical decision making (see chart for details).     BP (!) 160/64 (BP Location: Left Arm)   Pulse 96   Temp 97.6 F (36.4 C) (Oral)   Resp 14   SpO2 94%    Final Clinical Impressions(s) / ED Diagnoses   Final diagnoses:  Acute lower UTI  AKI (acute kidney injury) [facility])    ED Discharge Orders    None     12:45 PM Patient presents with complaints of urinary retention.  Prior history of left nephrectomy many years prior, no recent surgery.  He appears uncomfortable and will benefit from a bladder scan and a Foley insertion.  He also has a chronic left lateral abdominal hernia that has increased in size and become more painful constipation.  Will obtain CT scan for further evaluation.  2:35 PM Continue to endorse bladder discomfort and felt that he is retaining urine.  He was able to produce a small amount of urine, of bladder scan reveals less than 40 cc, Foley placed with minimal output.  Urine obtained showing positive nitrite and large leukocyte esterase consistent with UTI.  Urine culture sent.  Patient started on Rocephin.  He also has an elevated lactic acid of 2.8, and evidence of AKI with creatinine of 1.91.  Suspect elevated lactic acid is not likely due to sepsis as patient is not hypotensive, or febrile or tachycardic.  IV fluid given, care discussed with Dr. Francia Greaves.  4:12 PM Appreciate consultation from [facility] Dr. Loleta Books who agrees to admit pt.  Recommend discontinue foley catheter.  Pt voice understanding and agrees with plan.    Domenic Moras, PA-C 10/04/18 1613    Valarie Merino, MD 10/05/18 [phone]

## 2018-10-04 NOTE — ED Notes (Signed)
Admitting at bedside 

## 2018-10-04 NOTE — ED Notes (Signed)
Dr Francia Greaves aware of latic

## 2018-10-05 ENCOUNTER — Other Ambulatory Visit: Payer: Self-pay

## 2018-10-05 ENCOUNTER — Encounter (HOSPITAL_COMMUNITY): Payer: Self-pay | Admitting: General Practice

## 2018-10-05 DIAGNOSIS — Z720 Tobacco use: Secondary | ICD-10-CM | POA: Diagnosis not present

## 2018-10-05 DIAGNOSIS — I739 Peripheral vascular disease, unspecified: Secondary | ICD-10-CM | POA: Diagnosis not present

## 2018-10-05 DIAGNOSIS — I10 Essential (primary) hypertension: Secondary | ICD-10-CM | POA: Diagnosis not present

## 2018-10-05 DIAGNOSIS — N179 Acute kidney failure, unspecified: Secondary | ICD-10-CM | POA: Diagnosis not present

## 2018-10-05 DIAGNOSIS — N390 Urinary tract infection, site not specified: Secondary | ICD-10-CM | POA: Diagnosis not present

## 2018-10-05 LAB — BASIC METABOLIC PANEL
Anion gap: 7 (ref 5–15)
BUN: 26 mg/dL — ABNORMAL HIGH (ref 8–23)
CALCIUM: 8.3 mg/dL — AB (ref 8.9–10.3)
CO2: 26 mmol/L (ref 22–32)
Chloride: 103 mmol/L (ref 98–111)
Creatinine, Ser: 1.85 mg/dL — ABNORMAL HIGH (ref 0.61–1.24)
GFR calc Af Amer: 40 mL/min — ABNORMAL LOW (ref >60)
GFR calc non Af Amer: 35 mL/min — ABNORMAL LOW (ref >60)
Glucose, Bld: 147 mg/dL — ABNORMAL HIGH (ref 70–99)
Potassium: 4.4 mmol/L (ref 3.5–5.1)
SODIUM: 136 mmol/L (ref 135–145)

## 2018-10-05 LAB — GLUCOSE, CAPILLARY: Glucose-Capillary: 165 mg/dL — ABNORMAL HIGH (ref 70–99)

## 2018-10-05 MED ORDER — MOMETASONE FURO-FORMOTEROL FUM 200-5 MCG/ACT IN AERO: 2.0000 | INHALATION_SPRAY | Freq: Every day | RESPIRATORY_TRACT | Status: AC | PRN

## 2018-10-05 MED ORDER — CEFDINIR 300 MG PO CAPS: 300.0000 mg | ORAL_CAPSULE | Freq: Two times a day (BID) | ORAL | Status: DC

## 2018-10-05 MED ORDER — CEFDINIR 300 MG PO CAPS: 300.0000 mg | ORAL_CAPSULE | ORAL | 0 refills | Status: AC

## 2018-10-05 MED ORDER — CEFDINIR 300 MG PO CAPS
300.0000 mg | ORAL_CAPSULE | ORAL | Status: AC
  Administered 2018-10-05: 300 mg via ORAL
  Filled 2018-10-05 (×2): qty 1

## 2018-10-05 NOTE — Progress Notes (Signed)
Discharge teaching completed with pt. IV's discontinued.

## 2018-10-05 NOTE — Progress Notes (Signed)
Patient complained of severe bladder pain at the said time. RN tried available interventions but no relieve. Catheter was deflated for a few minutes which help relieve the pain. RN turned around and gave patient some time after the pain he had gone through but RN came back and catheter was out.  Patient was able to void right after catheter came out. Condom catheter applied and patient voiding well without pain. Will keep monitoring.

## 2018-10-05 NOTE — Discharge Summary (Addendum)
[name], is a 78 y.o. male  DOB Jan 30, 1941  MRN [id number].  Admission date:  10/04/2018  Admitting Physician  Edwin Dada, MD  Discharge Date:  10/05/2018   Primary MD  Lucia Gaskins, MD  Recommendations for primary care physician for things to follow:   Will need to follow-up sensitivities of urine culture  Discharge Diagnosis   Principal Problem:   AKI (acute kidney injury) ([place]) Active Problems:   Diabetes ([place])   Peripheral vascular disease ([place])   Hypertension   Protein-calorie malnutrition ([place])   Lower urinary tract infectious disease   CKD (chronic kidney disease), stage III ([place])      Past Medical History:  Diagnosis Date  . Anginal pain ([place])   . Arthritis    "qwhere" (07/09/2013)  . Asthma   . COPD (chronic obstructive pulmonary disease) ([place])    pt denies this hx on 07/09/2013  . Coronary artery disease   . Gout   . H/O hiatal hernia 1997  . Heart murmur    "had rheumatic fever as a kid" (07/09/2013)  . High cholesterol   . Hypertension   . Myocardial infarction ([place] ) 03/2002; ~ 2011  . Neuropathy   . Pneumonia    "once" (07/09/2013)  . Renal disorder    "only have 1 kidney; due to go back soon to check the other one" (07/09/2013)  . Rheumatic fever   . RUPTURE ROTATOR CUFF 12/03/2009   Qualifier: Diagnosis of  By: Aline Brochure MD, Dorothyann Peng    . Shortness of breath    "can happen at anytime" (07/09/2013)  . Type II diabetes mellitus ([place])   . Ureter cancer ([place]) 12/2001   "shut down my kidney & resulted in nephrectomy" (07/09/2013)  . UTI (urinary tract infection) 10/04/2018    Past Surgical History:  Procedure Laterality Date  . ABOVE KNEE LEG AMPUTATION Left 09/2009  . BELOW KNEE LEG AMPUTATION Left 08/2009  . CARDIAC CATHETERIZATION  ~ 2010   "before the bypass" (07/09/2013)    . CARPAL TUNNEL RELEASE Bilateral ~2004; ~ 2011  . CHOLECYSTECTOMY  1980's  . CORONARY ARTERY BYPASS GRAFT  ~ 2010   "CABG X5" (07/09/2013)  . EYE SURGERY    . FEMORAL ARTERY STENT Right 07/09/2013  . GLAUCOMA SURGERY Bilateral ?[id number]  . LOWER EXTREMITY ANGIOGRAM Right Nov. 4, 2014  . LOWER EXTREMITY ANGIOGRAM Right 07/09/2013   Procedure: LOWER EXTREMITY ANGIOGRAM;  Surgeon: Serafina Mitchell, MD;  Location: [facility] CATH LAB;  Service: Cardiovascular;  Laterality: Right;  . NEPHRECTOMY Left 12/19/2001  . PERIPHERAL VASCULAR CATHETERIZATION N/A 02/03/2015   Procedure: Abdominal Aortogram;  Surgeon: Serafina Mitchell, MD;  Location: [facility];  Service: Cardiovascular;  Laterality: N/A;  . TRIGGER FINGER RELEASE Left ~ 1990's       HPI  from the history and physical done on the day of admission:  [name] is a 78 y.o. M with hx ureteral cancer s/p left nephrectomy, CAD s/p CABG, IDDM, COPD not on home  O2, PVD, L AKA in 2012, and CKD III baseline Cr 1.3 who presents with urinary urgency for 1 day.  The patient was in his usual state of health until about 3 weeks ago, he developed dysuria, urinary urgency, saw his PCP who prescribed him an antibiotic for UTI, which resolved the symptoms.  However in the last few days, the patient has had recurrence of urinary urgency, urge to go but nothing comes out.    He has had no fever and chills, but yesterday he had dry heaves and vomiting.  Today he had worsening lower abdominal pain, and continued worsening feeling of having to pee but not being able to, so he came to the emergency room.  ED course: - Afebrile, heart rate 82, respirations and pulse ox normal, blood pressure 120/83 -Na 132, K 5.3, Cr 1.9 (baseline 1.3), WBC 8.2K, Hgb 15.3 - Urinalysis with red blood cells and white blood cells in the urine - Lactic acid 2.8 -Chest x-ray showed emphysema, no focal airspace disease or opacity -CT of the abdomen and pelvis showed no  hydronephrosis, but did show small nonobstructing stone - Bladder scan showed no retained urine, and Foley was inserted due to his feelings of urinary retention with only a few cc urine output. -He was given 1L NS, ceftriaxone and TRH were asked to evluate for AKI    Hospital Course:   1.  Acute kidney injury superimposed on chronic kidney disease stage III: Trending downward. patient presented with creatinine of 1.91 with BUN elevated at 24.  Baseline previously noted to be around 1.3.  Imaging of the abdomen did not reveal any acute obstruction.  Foley catheter had temporarily been placed due to urinary retention.  Started on antibiotics of IV Rocephin.  Overnight patient had complained of significant pain and discomfort and Foley catheter was removed.  Patient urinating without issue.  Patient was bolused 1 L of normal saline IV fluids and then placed on a continuous rate overnight.  Following morning creatinine trending down to 1.85.  Patient requesting discharge home.  Discussed with patient the need to keep himself adequately hydrated especially on antibiotics.  2.  Urinary tract infection with hematuria: Acute.  Urinalysis positive for large leukocytes and nitrites.  Patient with previous history of UTI with note of resistance to Keflex.  Patient had initially been given Rocephin.  Switched to oral Omnicef to complete 5-day course.  Urine cultures were initially positive for gram-negative rods which grew out Proteus with resistance.  3.  Peripheral vascular disease: Chronic.  Continued on aspirin.  4.  Diabetes mellitus type 2: Last available hemoglobin A1c noted to be 6.8 on 05/2017.  Patient on oral medications of metformin.  He was placed on sliding scale insulin during hospital stay and was started back on home regimen at discharge.  5.  Benign prostatic hypertrophy: Chronic.  No signs of obstruction noted on imaging.  Patient was continued on Flomax.  He was advised to follow-up in  outpatient setting with his urologist Dr. Alyson Ingles.  6.  COPD, without acute exacerbation: No active disease noted.  Patient continued on Dulera  7.  Tobacco abuse: Patient wanted immediate discharge as he wanted to smoke. Counseled on need of cessation, but he expressed no will of wanting to quit at this time also declining nicotine patch.    Follow UP  Follow-up Information    Lucia Gaskins, MD Follow up on 11/05/2018.   Specialty:  Internal Medicine Why:  At 8:45 AM Contact  information: [place] [phone]        [facility]. Schedule an appointment as soon as possible for a visit.   Contact information: [place] [phone]           Consults obtained: None  Discharge Condition: Stable  Diet and Activity recommendation: See Discharge Instructions below   Discharge Instructions    Diet - low sodium heart healthy   Complete by:  As directed    Discharge instructions   Complete by:  As directed    Please follow-up with primary care provider as previously scheduled and set up appointment with urology.  Your primary care provider will need to follow-up on urine cultures currently pending to determine if adequate antibiotic coverage given previous history of resistant bacteria.  You will need to keep yourself adequately hydrated while on antibiotics and get repeat blood work of BMP at your primary care office to ensure that kidney function is improving.  You were not prescribed Bactrim as this can sometimes cause worsening in kidney function.   Increase activity slowly   Complete by:  As directed         Discharge Medications     Allergies as of 10/05/2018      Reactions   Erythromycin Anaphylaxis, Rash   Iohexol Anaphylaxis, Hives, Itching, Other (See Comments)   Note:  13 HR PRE-MED GIVEN AND SUCCESSFUL-ARS 07/15/07.     Penicillins Anaphylaxis, Itching, Other  (See Comments)   Has patient had a PCN reaction causing immediate rash, facial/tongue/throat swelling, SOB or lightheadedness with hypotension: Yes Has patient had a PCN reaction causing severe rash involving mucus membranes or skin necrosis: No Has patient had a PCN reaction that required hospitalization No Has patient had a PCN reaction occurring within the last 10 years: No If all of the above answers are "NO", then may proceed with Cephalosporin use.      Medication List    STOP taking these medications   diclofenac sodium 1 % Gel Commonly known as:  VOLTAREN   doxycycline 100 MG capsule Commonly known as:  VIBRAMYCIN   lidocaine 5 % Commonly known as:  LIDODERM   ondansetron 4 MG disintegrating tablet Commonly known as:  ZOFRAN ODT   Oxycodone HCl 20 MG Tabs   OXYGEN   polyethylene glycol packet Commonly known as:  MIRALAX   traMADol 50 MG tablet Commonly known as:  ULTRAM     TAKE these medications   albuterol 108 (90 Base) MCG/ACT inhaler Commonly known as:  PROVENTIL HFA;VENTOLIN HFA Inhale 2 puffs into the lungs every 4 (four) hours as needed for wheezing or shortness of breath.   albuterol (2.5 MG/3ML) 0.083% nebulizer solution Commonly known as:  PROVENTIL Take 2.5 mg by nebulization every 6 (six) hours as needed for wheezing or shortness of breath.   aspirin EC 81 MG tablet Take 81 mg by mouth at bedtime.   cholecalciferol 10 MCG (400 UNIT) Tabs tablet Commonly known as:  VITAMIN D3 Take 1,200 Units by mouth 2 (two) times daily.   DULoxetine 30 MG capsule Commonly known as:  CYMBALTA Take 1 capsule (30 mg total) by mouth daily.   Foam Dressing Pads 1 patch by Does not apply route daily.   gabapentin 300 MG capsule Commonly known as:  NEURONTIN Take 1 capsule (300 mg total) by mouth 2 (two) times daily. Pt takes two capsules in the morning and three  at bedtime. What changed:    how much to take  when to take this   metFORMIN 500 MG  tablet Commonly known as:  GLUCOPHAGE Take 500 mg by mouth 2 (two) times daily with a meal.   mometasone-formoterol 200-5 MCG/ACT Aero Commonly known as:  DULERA Inhale 2 puffs into the lungs daily as needed for wheezing or shortness of breath.   nicotine 21 mg/24hr patch Commonly known as:  NICODERM CQ - dosed in mg/24 hours Place 1 patch (21 mg total) onto the skin daily.   nitroGLYCERIN 0.4 MG SL tablet Commonly known as:  NITROSTAT Place 0.4 mg under the tongue every 5 (five) minutes as needed for chest pain.   QUEtiapine 300 MG tablet Commonly known as:  SEROQUEL Take 300 mg by mouth at bedtime.   tamsulosin 0.4 MG Caps capsule Commonly known as:  FLOMAX Take 0.4 mg by mouth 2 (two) times daily.   TOUJEO SOLOSTAR 300 UNIT/ML Sopn Generic drug:  Insulin Glargine Inject 5 Units into the skin daily as needed (for BS greater than 200).   triazolam 0.25 MG tablet Commonly known as:  HALCION Take 0.5 mg by mouth at bedtime as needed for sleep.     ASK your doctor about these medications   cefdinir 300 MG capsule Commonly known as:  OMNICEF Take 1 capsule (300 mg total) by mouth stat for 1 dose. Ask about: Should I take this medication?       Major procedures and Radiology Reports - PLEASE review detailed and final reports for all details, in brief -    Dg Chest 2 View  Result Date: 10/04/2018 CLINICAL DATA:  Initial evaluation for acute swelling, shortness of breath. EXAM: CHEST - 2 VIEW COMPARISON:  Prior radiograph from 06/03/2018 FINDINGS: Median sternotomy wires underlying surgical clips noted. Underlying mild cardiomegaly, stable. Mediastinal silhouette normal. Aortic atherosclerosis. Lungs mildly hyperinflated. Elevation of the left hemidiaphragm with associated left basilar subsegmental atelectasis and/or scarring, similar to previous. Mild right basilar atelectasis. Underlying COPD. No focal infiltrates. No pulmonary edema or pleural effusion. The no  pneumothorax. No acute osseous abnormality. IMPRESSION: 1. COPD with superimposed bibasilar subsegmental atelectasis and/or scarring. 2. No other active cardiopulmonary disease. 3. Aortic atherosclerosis. Electronically Signed   By: Jeannine Boga M.D.   On: 10/04/2018 13:27   Ct Renal Stone Study  Result Date: 10/04/2018 CLINICAL DATA:  78 year old male with urinary retention and abdominal pain. History of prior LEFT nephrectomy. EXAM: CT ABDOMEN AND PELVIS WITHOUT CONTRAST TECHNIQUE: Multidetector CT imaging of the abdomen and pelvis was performed following the standard protocol without IV contrast. COMPARISON:  05/09/2017 and prior CTs FINDINGS: Please note that parenchymal abnormalities may be missed without intravenous contrast. Lower chest: No acute abnormality. Hepatobiliary: The liver is unremarkable. The patient is status post cholecystectomy. No biliary dilatation. Pancreas: Unremarkable Spleen: Unremarkable Adrenals/Urinary Tract: Patient is status post LEFT nephrectomy. No abnormalities are identified in the nephrectomy bed. The RIGHT kidney and adrenal glands are unremarkable except for a punctate nonobstructing mid RIGHT renal calculus. There is no evidence of RIGHT hydronephrosis. A Foley catheter is present within the bladder. Stomach/Bowel: Stomach is within normal limits. Appendix appears normal. No evidence of bowel wall thickening, distention, or inflammatory changes. Vascular/Lymphatic: Aortic atherosclerosis. No enlarged abdominal or pelvic lymph nodes. Reproductive: Unchanged prostate. Other: No ascites, focal collection or pneumoperitoneum. Musculoskeletal: No acute or suspicious bony abnormalities are identified. Remote L1 compression fracture and vertebral augmentation changes again noted. Mild SUPERIOR endplate compression of T11  and T12 again noted. IMPRESSION: 1. No acute abnormality. 2. Punctate nonobstructing RIGHT renal calculus. No evidence of RIGHT hydronephrosis. Foley  catheter within the bladder. 3. Remote LOWER thoracic and L1 compression fractures and L1 vertebral augmentation changes. 4.  Aortic Atherosclerosis (ICD10-I70.0). Electronically Signed   By: Margarette Canada M.D.   On: 10/04/2018 15:17    Micro Results    Recent Results (from the past 240 hour(s))  Urine Culture     Status: Abnormal   Collection Time: 10/04/18  3:33 PM  Result Value Ref Range Status   Specimen Description URINE, RANDOM  Final   Special Requests   Final    NONE Performed at [facility], [address], [place], [place] [address]    Culture >=100,000 COLONIES/mL PROTEUS SPECIES (A)  Final   Report Status 10/06/2018 FINAL  Final   Organism ID, Bacteria PROTEUS SPECIES (A)  Final      Susceptibility   Proteus species - MIC*    AMPICILLIN >=32 RESISTANT Resistant     CEFAZOLIN >=64 RESISTANT Resistant     CEFTRIAXONE <=1 SENSITIVE Sensitive     CIPROFLOXACIN <=0.25 SENSITIVE Sensitive     GENTAMICIN <=1 SENSITIVE Sensitive     IMIPENEM 8 INTERMEDIATE Intermediate     NITROFURANTOIN RESISTANT Resistant     TRIMETH/SULFA <=20 SENSITIVE Sensitive     AMPICILLIN/SULBACTAM >=32 RESISTANT Resistant     PIP/TAZO <=4 SENSITIVE Sensitive     * >=100,000 COLONIES/mL PROTEUS SPECIES       Today   Subjective    Kinnick Maus today states that he is ready to go home and had no issues with urinating after Foley catheter was moved.  He needs follow-up with [facility], but has not made appointment yet.   Objective   Blood pressure 138/88, pulse 95, temperature 98.2 F (36.8 C), temperature source Oral, resp. rate 20, height 6\' 3"  (1.905 m), SpO2 92 %.   Intake/Output Summary (Last 24 hours) at 10/05/2018 1121 Last data filed at 10/05/2018 0900 Gross per 24 hour  Intake 872.49 ml  Output 1250 ml  Net -377.51 ml    Exam  Constitutional: Elderly male in NAD. Eyes: PERRL, lids and conjunctivae normal ENMT: Mucous membranes are moist. Posterior pharynx clear  of any exudate or lesions.  Edentulous. Neck: normal, supple, no masses, no thyromegaly Respiratory: clear to auscultation bilaterally, no wheezing, no crackles. Normal respiratory effort. No accessory muscle use.  Cardiovascular: Regular rate and rhythm, no murmurs / rubs / gallops. No extremity edema. 2+ pedal pulses. No carotid bruits.  Abdomen: no tenderness, no masses palpated. No hepatosplenomegaly. Bowel sounds positive.  Musculoskeletal: no clubbing / cyanosis.  Left AKA Skin: no rashes, lesions, ulcers. No induration Neurologic: CN 2-12 grossly intact. Sensation intact, DTR normal. Strength 5/5 in all 4.  Psychiatric: Normal judgment and insight. Alert and oriented x 3. Normal mood.    Data Review   CBC w Diff:  Lab Results  Component Value Date   WBC 8.2 10/04/2018   HGB 15.3 10/04/2018   HCT 48.2 10/04/2018   PLT 256 10/04/2018   LYMPHOPCT 21 10/04/2018   MONOPCT 7 10/04/2018   EOSPCT 1 10/04/2018   BASOPCT 0 10/04/2018    CMP:  Lab Results  Component Value Date   NA 136 10/05/2018   K 4.4 10/05/2018   CL 103 10/05/2018   CO2 26 10/05/2018   BUN 26 (H) 10/05/2018   CREATININE 1.85 (H) 10/05/2018   PROT 7.5  10/04/2018   ALBUMIN 4.1 10/04/2018   BILITOT 0.4 10/04/2018   ALKPHOS 56 10/04/2018   AST 18 10/04/2018   ALT 12 10/04/2018  .   Total Time in preparing paper work, data evaluation and todays exam - 35 minutes  Norval Morton M.D on 10/05/2018 at 11:21 AM  [facility]   Office  [phone]

## 2018-10-06 DIAGNOSIS — Z720 Tobacco use: Secondary | ICD-10-CM | POA: Diagnosis present

## 2018-10-06 LAB — URINE CULTURE: Culture: >=100000 — AB

## 2018-10-08 DIAGNOSIS — J449 Chronic obstructive pulmonary disease, unspecified: Secondary | ICD-10-CM | POA: Diagnosis not present

## 2018-10-08 DIAGNOSIS — E1165 Type 2 diabetes mellitus with hyperglycemia: Secondary | ICD-10-CM | POA: Diagnosis not present

## 2018-10-08 DIAGNOSIS — G894 Chronic pain syndrome: Secondary | ICD-10-CM | POA: Diagnosis not present

## 2018-10-20 DIAGNOSIS — J449 Chronic obstructive pulmonary disease, unspecified: Secondary | ICD-10-CM | POA: Diagnosis not present

## 2018-11-05 DIAGNOSIS — E1165 Type 2 diabetes mellitus with hyperglycemia: Secondary | ICD-10-CM | POA: Diagnosis not present

## 2018-11-05 DIAGNOSIS — G894 Chronic pain syndrome: Secondary | ICD-10-CM | POA: Diagnosis not present

## 2018-11-05 DIAGNOSIS — R5382 Chronic fatigue, unspecified: Secondary | ICD-10-CM | POA: Diagnosis not present

## 2018-11-05 DIAGNOSIS — K219 Gastro-esophageal reflux disease without esophagitis: Secondary | ICD-10-CM | POA: Diagnosis not present

## 2018-11-14 DIAGNOSIS — M62838 Other muscle spasm: Secondary | ICD-10-CM | POA: Diagnosis not present

## 2018-11-14 DIAGNOSIS — K219 Gastro-esophageal reflux disease without esophagitis: Secondary | ICD-10-CM | POA: Diagnosis not present

## 2018-11-14 DIAGNOSIS — G894 Chronic pain syndrome: Secondary | ICD-10-CM | POA: Diagnosis not present

## 2018-11-14 DIAGNOSIS — R5382 Chronic fatigue, unspecified: Secondary | ICD-10-CM | POA: Diagnosis not present

## 2018-11-17 DIAGNOSIS — Z884 Allergy status to anesthetic agent status: Secondary | ICD-10-CM | POA: Diagnosis not present

## 2018-11-17 DIAGNOSIS — R739 Hyperglycemia, unspecified: Secondary | ICD-10-CM | POA: Diagnosis not present

## 2018-11-17 DIAGNOSIS — Z881 Allergy status to other antibiotic agents status: Secondary | ICD-10-CM | POA: Diagnosis not present

## 2018-11-17 DIAGNOSIS — Z79899 Other long term (current) drug therapy: Secondary | ICD-10-CM | POA: Diagnosis not present

## 2018-11-17 DIAGNOSIS — R69 Illness, unspecified: Secondary | ICD-10-CM | POA: Diagnosis not present

## 2018-11-17 DIAGNOSIS — Z7982 Long term (current) use of aspirin: Secondary | ICD-10-CM | POA: Diagnosis not present

## 2018-11-17 DIAGNOSIS — Z880 Allergy status to penicillin: Secondary | ICD-10-CM | POA: Diagnosis not present

## 2018-11-17 DIAGNOSIS — Z5329 Procedure and treatment not carried out because of patient's decision for other reasons: Secondary | ICD-10-CM | POA: Diagnosis not present

## 2018-12-28 DIAGNOSIS — I110 Hypertensive heart disease with heart failure: Secondary | ICD-10-CM | POA: Diagnosis not present

## 2018-12-28 DIAGNOSIS — E1165 Type 2 diabetes mellitus with hyperglycemia: Secondary | ICD-10-CM | POA: Diagnosis not present

## 2018-12-28 DIAGNOSIS — G894 Chronic pain syndrome: Secondary | ICD-10-CM | POA: Diagnosis not present

## 2018-12-28 DIAGNOSIS — M2550 Pain in unspecified joint: Secondary | ICD-10-CM | POA: Diagnosis not present

## 2019-01-14 DIAGNOSIS — Z791 Long term (current) use of non-steroidal anti-inflammatories (NSAID): Secondary | ICD-10-CM | POA: Diagnosis not present

## 2019-01-14 DIAGNOSIS — Z720 Tobacco use: Secondary | ICD-10-CM | POA: Diagnosis not present

## 2019-01-14 DIAGNOSIS — I2510 Atherosclerotic heart disease of native coronary artery without angina pectoris: Secondary | ICD-10-CM | POA: Diagnosis not present

## 2019-01-14 DIAGNOSIS — N400 Enlarged prostate without lower urinary tract symptoms: Secondary | ICD-10-CM | POA: Diagnosis not present

## 2019-01-14 DIAGNOSIS — Z89612 Acquired absence of left leg above knee: Secondary | ICD-10-CM | POA: Diagnosis not present

## 2019-01-14 DIAGNOSIS — E1151 Type 2 diabetes mellitus with diabetic peripheral angiopathy without gangrene: Secondary | ICD-10-CM | POA: Diagnosis not present

## 2019-01-14 DIAGNOSIS — J449 Chronic obstructive pulmonary disease, unspecified: Secondary | ICD-10-CM | POA: Diagnosis not present

## 2019-01-14 DIAGNOSIS — G4700 Insomnia, unspecified: Secondary | ICD-10-CM | POA: Diagnosis not present

## 2019-01-14 DIAGNOSIS — E1142 Type 2 diabetes mellitus with diabetic polyneuropathy: Secondary | ICD-10-CM | POA: Diagnosis not present

## 2019-01-14 DIAGNOSIS — I252 Old myocardial infarction: Secondary | ICD-10-CM | POA: Diagnosis not present

## 2019-01-15 ENCOUNTER — Ambulatory Visit: Payer: Self-pay | Admitting: General Surgery

## 2019-01-17 ENCOUNTER — Ambulatory Visit: Payer: Self-pay | Admitting: General Surgery

## 2019-02-02 DIAGNOSIS — R69 Illness, unspecified: Secondary | ICD-10-CM | POA: Diagnosis not present

## 2019-02-05 ENCOUNTER — Telehealth: Payer: Self-pay | Admitting: *Deleted

## 2019-02-05 ENCOUNTER — Other Ambulatory Visit: Payer: Self-pay | Admitting: *Deleted

## 2019-02-05 DIAGNOSIS — I739 Peripheral vascular disease, unspecified: Secondary | ICD-10-CM

## 2019-02-05 NOTE — Telephone Encounter (Signed)
Patient refused first appointments given for 6/8 and 6/10. Accepted appt for vascular studies and provider appt. on 02/15/2019.

## 2019-02-05 NOTE — Progress Notes (Signed)
Patient called and c/o right leg puffiness and "so red and discolored it looks black". States that leg is usually somewhat discolored but worse this am.  Claims extremity is warm and sensation present. Due to his history and has not been seen since 05/2017, patient was instructed to go to the ER at [facility] for evaluation fear of ischemia to RLE. I stressed repeatedly. Concerned that he may need immediate intervention.  He states they will only refer him to his primary doctor. I told him vascular surgeon on call at [facility]. He again refused and "what about my appointment". Will order vascular studies and f/u with NP ASAP and appointment desk will call with time. AGAIN instructed to report to [facility] ER for evaluation of acute condition

## 2019-02-07 ENCOUNTER — Telehealth: Payer: Self-pay

## 2019-02-07 NOTE — Telephone Encounter (Signed)
Patient called and said that he has an appt for next Friday but he said that his leg has gotten worse and he is concerned and would really like to have someone look at it today. He said that he could send a picture if needed so we can see what is going on. He said that he really doesn't want to wait until the end of next week.   Per chart note [name] offered him an appt on June 2nd, 8th and 10th and patient refused. Those appts are no longer available according to scheduling.   Called pt and advised him that he should go to the ER if his leg is as bad as he is stating. He said that he could not go because he had too much going on. Tried to explain to him if he puts it off and loses his leg that very well may interfere with more of the things that he needs to do. He said that he thinks he is already beyond the point of fixing the leg and has similar symptoms as he did with the other leg. Again encouraged him to please go to the ER and have it checked.   He said that he would try to find someone to take him and take care of the things that he has going on. He will let me know if he needs to change the appt next Friday if he does indeed go to ER and have treatment.   [name], CMA

## 2019-02-15 ENCOUNTER — Ambulatory Visit (HOSPITAL_COMMUNITY)
Admission: RE | Admit: 2019-02-15 | Discharge: 2019-02-15 | Disposition: A | Discharge status: Home / Self Care | Payer: Medicare HMO | Source: Ambulatory Visit | Attending: Family | Admitting: Family

## 2019-02-15 ENCOUNTER — Encounter: Payer: Self-pay | Admitting: Family

## 2019-02-15 ENCOUNTER — Ambulatory Visit (INDEPENDENT_AMBULATORY_CARE_PROVIDER_SITE_OTHER): Payer: Medicare Other | Admitting: Family

## 2019-02-15 ENCOUNTER — Other Ambulatory Visit: Payer: Self-pay

## 2019-02-15 ENCOUNTER — Ambulatory Visit (INDEPENDENT_AMBULATORY_CARE_PROVIDER_SITE_OTHER)
Admission: RE | Admit: 2019-02-15 | Discharge: 2019-02-15 | Disposition: A | Discharge status: Home / Self Care | Payer: Medicare HMO | Source: Ambulatory Visit | Attending: Family | Admitting: Family

## 2019-02-15 VITALS — BP 135/81 | HR 100 | Temp 98.0°F

## 2019-02-15 DIAGNOSIS — F17200 Nicotine dependence, unspecified, uncomplicated: Secondary | ICD-10-CM | POA: Diagnosis not present

## 2019-02-15 DIAGNOSIS — I739 Peripheral vascular disease, unspecified: Secondary | ICD-10-CM | POA: Insufficient documentation

## 2019-02-15 DIAGNOSIS — I779 Disorder of arteries and arterioles, unspecified: Secondary | ICD-10-CM

## 2019-02-15 DIAGNOSIS — L03115 Cellulitis of right lower limb: Secondary | ICD-10-CM

## 2019-02-15 DIAGNOSIS — R69 Illness, unspecified: Secondary | ICD-10-CM | POA: Diagnosis not present

## 2019-02-15 DIAGNOSIS — Z89612 Acquired absence of left leg above knee: Secondary | ICD-10-CM

## 2019-02-15 MED ORDER — DOXYCYCLINE HYCLATE 100 MG PO CAPS: 100.0000 mg | ORAL_CAPSULE | Freq: Two times a day (BID) | ORAL | 0 refills | Status: AC

## 2019-02-15 NOTE — Patient Instructions (Addendum)
Before your next abdominal ultrasound:  Avoid gas forming foods and beverages the day before the test.   Take two Extra-Strength Gas-X capsules at bedtime the night before the test. Take another two Extra-Strength Gas-X capsules in the middle of the night if you get up to the restroom, if not, first thing in the morning with water.  Do not chew gum.      Steps to Quit Smoking  Smoking tobacco can be bad for your health. It can also affect almost every organ in your body. Smoking puts you and people around you at risk for many serious long-lasting (chronic) diseases. Quitting smoking is hard, but it is one of the best things that you can do for your health. It is never too late to quit. What are the benefits of quitting smoking? When you quit smoking, you lower your risk for getting serious diseases and conditions. They can include:  Lung cancer or lung disease.  Heart disease.  Stroke.  Heart attack.  Not being able to have children (infertility).  Weak bones (osteoporosis) and broken bones (fractures). If you have coughing, wheezing, and shortness of breath, those symptoms may get better when you quit. You may also get sick less often. If you are pregnant, quitting smoking can help to lower your chances of having a baby of low birth weight. What can I do to help me quit smoking? Talk with your doctor about what can help you quit smoking. Some things you can do (strategies) include:  Quitting smoking totally, instead of slowly cutting back how much you smoke over a period of time.  Going to in-person counseling. You are more likely to quit if you go to many counseling sessions.  Using resources and support systems, such as: ? Database administrator with a Social worker. ? Phone quitlines. ? Careers information officer. ? Support groups or group counseling. ? Text messaging programs. ? Mobile phone apps or applications.  Taking medicines. Some of these medicines may have nicotine in them.  If you are pregnant or breastfeeding, do not take any medicines to quit smoking unless your doctor says it is okay. Talk with your doctor about counseling or other things that can help you. Talk with your doctor about using more than one strategy at the same time, such as taking medicines while you are also going to in-person counseling. This can help make quitting easier. What things can I do to make it easier to quit? Quitting smoking might feel very hard at first, but there is a lot that you can do to make it easier. Take these steps:  Talk to your family and friends. Ask them to support and encourage you.  Call phone quitlines, reach out to support groups, or work with a Social worker.  Ask people who smoke to not smoke around you.  Avoid places that make you want (trigger) to smoke, such as: ? Bars. ? Parties. ? Smoke-break areas at work.  Spend time with people who do not smoke.  Lower the stress in your life. Stress can make you want to smoke. Try these things to help your stress: ? Getting regular exercise. ? Deep-breathing exercises. ? Yoga. ? Meditating. ? Doing a body scan. To do this, close your eyes, focus on one area of your body at a time from head to toe, and notice which parts of your body are tense. Try to relax the muscles in those areas.  Download or buy apps on your mobile phone or tablet that can  help you stick to your quit plan. There are many free apps, such as QuitGuide from the [employer] Office manager for Disease Control and Prevention). You can find more support from smokefree.gov and other websites. This information is not intended to replace advice given to you by your health care provider. Make sure you discuss any questions you have with your health care provider. Document Released: 06/18/2009 Document Revised: 04/19/2016 Document Reviewed: 01/06/2015 Elsevier Interactive Patient Education  2019 [employer].     Peripheral Vascular Disease  Peripheral vascular  disease (PVD) is a disease of the blood vessels that are not part of your heart and brain. A simple term for PVD is poor circulation. In most cases, PVD narrows the blood vessels that carry blood from your heart to the rest of your body. This can reduce the supply of blood to your arms, legs, and internal organs, like your stomach or kidneys. However, PVD most often affects a person's lower legs and feet. Without treatment, PVD tends to get worse. PVD can also lead to acute ischemic limb. This is when an arm or leg suddenly cannot get enough blood. This is a medical emergency. Follow these instructions at home: Lifestyle  Do not use any products that contain nicotine or tobacco, such as cigarettes and e-cigarettes. If you need help quitting, ask your doctor.  Lose weight if you are overweight. Or, stay at a healthy weight as told by your doctor.  Eat a diet that is low in fat and cholesterol. If you need help, ask your doctor.  Exercise regularly. Ask your doctor for activities that are right for you. General instructions  Take over-the-counter and prescription medicines only as told by your doctor.  Take good care of your feet: ? Wear comfortable shoes that fit well. ? Check your feet often for any cuts or sores.  Keep all follow-up visits as told by your doctor This is important. Contact a doctor if:  You have cramps in your legs when you walk.  You have leg pain when you are at rest.  You have coldness in a leg or foot.  Your skin changes.  You are unable to get or have an erection (erectile dysfunction).  You have cuts or sores on your feet that do not heal. Get help right away if:  Your arm or leg turns cold, numb, and blue.  Your arms or legs become red, warm, swollen, painful, or numb.  You have chest pain.  You have trouble breathing.  You suddenly have weakness in your face, arm, or leg.  You become very confused or you cannot speak.  You suddenly have a very  bad headache.  You suddenly cannot see. Summary  Peripheral vascular disease (PVD) is a disease of the blood vessels.  A simple term for PVD is poor circulation. Without treatment, PVD tends to get worse.  Treatment may include exercise, low fat and low cholesterol diet, and quitting smoking. This information is not intended to replace advice given to you by your health care provider. Make sure you discuss any questions you have with your health care provider. Document Released: 11/16/2009 Document Revised: 09/29/2016 Document Reviewed: 09/29/2016 Elsevier Interactive Patient Education  2019 [employer].

## 2019-02-15 NOTE — Progress Notes (Signed)
[facility] [place]   CC: Follow up peripheral artery occlusive disease  History of Present Illness [name] is a 78 y.o. male who is s/p stenting of his right superficial femoral artery on 07/09/2013 by Dr. Trula Slade. He has a chronic contracture of his right leg;  he has a left above-knee amputation but has not been able to use his prosthesis. He gets around by motorized wheelchair.  In the past he has complained of swelling in his right leg as well as feeling very cold knee.   The patient continues to smoke. He has DM, seems to be in control. He takes Neurontin for his neuropathic pain. He has a history of coronary artery disease with 5 vessel CABG about 2008 in [place].  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   On 02/03/15 Dr. Trula Slade performed an arteriogram. Findings were as follows: #1 no significant aortoiliac occlusion or stenosis #2 right lower extremity reveals a widely patent stent and no significant change from previous imaging. There is diffuse disease throughout the femoral-popliteal segment with no stenosis being greater than 30%.   Dr. Trula Slade last evaluated pt on 05-29-17. At that time he discussed with the patient, that he did not feel there was anything additional from a vascular perspective and he could provide.  Pt had palpable femoral pulses and his duplex showed the aortoiliac system remained widely patent. Dr. Trula Slade indicated that he thought pt is suffering from nerve pain.  This could either be ischemic neuropathy in nature or it could be secondary to his degenerative back disease.  He was scheduled to undergo injections of his back the next day.  Dr. Trula Slade was optimistic that this would provide him with some relief.  He is going to need long-term management by a pain service.  He states he is not in pain management, but states that his doctor prescribes hydrocodone for him.  He has multiple complaints: loss of appetite,  right foot pain and advancing redness from foot proximally x 1-2 months, left AKA stump stabbing pain, and worsening dyspnea.  He denies wounds in his foot or leg, denies wounds in his left AKA stump.   Diabetic: Yes, A1C 6.2 in October 2017 (review of records), he does not know his last A1C  Tobacco use:  smoker  (1 ppd, started at age 33 yrs, quit a few times)   Pt meds include: Statin :No Betablocker: No ASA: Yes Other anticoagulants/antiplatelets: no  Past Medical History:  Diagnosis Date  . Anginal pain ([place])   . Arthritis    "qwhere" (07/09/2013)  . Asthma   . COPD (chronic obstructive pulmonary disease) ([place])    pt denies this hx on 07/09/2013  . Coronary artery disease   . Gout   . H/O hiatal hernia 1997  . Heart murmur    "had rheumatic fever as a kid" (07/09/2013)  . High cholesterol   . Hypertension   . Myocardial infarction ([place]) 03/2002; ~ 2011  . Neuropathy   . Pneumonia    "once" (07/09/2013)  . Renal disorder    "only have 1 kidney; due to go back soon to check the other one" (07/09/2013)  . Rheumatic fever   . RUPTURE ROTATOR CUFF 12/03/2009   Qualifier: Diagnosis of  By: Aline Brochure MD, Dorothyann Peng    . Shortness of breath    "can happen at anytime" (07/09/2013)  . Type II diabetes mellitus ([place])   . Ureter cancer ([place]) 12/2001   "shut  down my kidney & resulted in nephrectomy" (07/09/2013)  . UTI (urinary tract infection) 10/04/2018    Social History Social History   Tobacco Use  . Smoking status: Current Every Day Smoker    Packs/day: 1.50    Years: 54.00    Pack years: 81.00    Types: Cigarettes  . Smokeless tobacco: Never Used  Substance Use Topics  . Alcohol use: No  . Drug use: No    Family History Family History  Problem Relation Age of Onset  . Cancer Mother   . Heart attack Father     Past Surgical History:  Procedure Laterality Date  . ABOVE KNEE LEG AMPUTATION Left 09/2009  . BELOW KNEE LEG AMPUTATION Left 08/2009  . CARDIAC  CATHETERIZATION  ~ 2010   "before the bypass" (07/09/2013)  . CARPAL TUNNEL RELEASE Bilateral ~2004; ~ 2011  . CHOLECYSTECTOMY  1980's  . CORONARY ARTERY BYPASS GRAFT  ~ 2010   "CABG X5" (07/09/2013)  . EYE SURGERY    . FEMORAL ARTERY STENT Right 07/09/2013  . GLAUCOMA SURGERY Bilateral ?[id number]  . LOWER EXTREMITY ANGIOGRAM Right Nov. 4, 2014  . LOWER EXTREMITY ANGIOGRAM Right 07/09/2013   Procedure: LOWER EXTREMITY ANGIOGRAM;  Surgeon: Serafina Mitchell, MD;  Location: [facility] CATH LAB;  Service: Cardiovascular;  Laterality: Right;  . NEPHRECTOMY Left 12/19/2001  . PERIPHERAL VASCULAR CATHETERIZATION N/A 02/03/2015   Procedure: Abdominal Aortogram;  Surgeon: Serafina Mitchell, MD;  Location: [facility];  Service: Cardiovascular;  Laterality: N/A;  . TRIGGER FINGER RELEASE Left ~ 1990's    Allergies  Allergen Reactions  . Erythromycin Anaphylaxis and Rash  . Iohexol Anaphylaxis, Hives, Itching and Other (See Comments)    Note:  13 HR PRE-MED GIVEN AND SUCCESSFUL-ARS 07/15/07.     Marland Kitchen Penicillins Anaphylaxis, Itching and Other (See Comments)    Has patient had a PCN reaction causing immediate rash, facial/tongue/throat swelling, SOB or lightheadedness with hypotension: Yes Has patient had a PCN reaction causing severe rash involving mucus membranes or skin necrosis: No Has patient had a PCN reaction that required hospitalization No Has patient had a PCN reaction occurring within the last 10 years: No If all of the above answers are "NO", then may proceed with Cephalosporin use.    Current Outpatient Medications  Medication Sig Dispense Refill  . albuterol (PROVENTIL HFA;VENTOLIN HFA) 108 (90 Base) MCG/ACT inhaler Inhale 2 puffs into the lungs every 4 (four) hours as needed for wheezing or shortness of breath.    Marland Kitchen albuterol (PROVENTIL) (2.5 MG/3ML) 0.083% nebulizer solution Take 2.5 mg by nebulization every 6 (six) hours as needed for wheezing or shortness of breath.    Marland Kitchen aspirin EC 81  MG tablet Take 81 mg by mouth at bedtime.    . cholecalciferol (VITAMIN D) 400 units TABS tablet Take 1,200 Units by mouth 2 (two) times daily.    Marland Kitchen gabapentin (NEURONTIN) 300 MG capsule Take 1 capsule (300 mg total) by mouth 2 (two) times daily. Pt takes two capsules in the morning and three at bedtime. (Patient taking differently: Take 600-900 mg by mouth See admin instructions. Pt takes two capsules in the morning and three at bedtime.)    . Gauze Pads & Dressings (FOAM DRESSING) PADS 1 patch by Does not apply route daily. 30 each 0  . Insulin Glargine (TOUJEO SOLOSTAR) 300 UNIT/ML SOPN Inject 5 Units into the skin daily as needed (for BS greater than 200).     . metFORMIN (GLUCOPHAGE) 500  MG tablet Take 500 mg by mouth 2 (two) times daily with a meal.    . mometasone-formoterol (DULERA) 200-5 MCG/ACT AERO Inhale 2 puffs into the lungs daily as needed for wheezing or shortness of breath.    . nicotine (NICODERM CQ - DOSED IN MG/24 HOURS) 21 mg/24hr patch Place 1 patch (21 mg total) onto the skin daily. 28 patch 0  . nitroGLYCERIN (NITROSTAT) 0.4 MG SL tablet Place 0.4 mg under the tongue every 5 (five) minutes as needed for chest pain.    Marland Kitchen QUEtiapine (SEROQUEL) 300 MG tablet Take 300 mg by mouth at bedtime.    . Tamsulosin HCl (FLOMAX) 0.4 MG CAPS Take 0.4 mg by mouth 2 (two) times daily.     . triazolam (HALCION) 0.25 MG tablet Take 0.5 mg by mouth at bedtime as needed for sleep.     . DULoxetine (CYMBALTA) 30 MG capsule Take 1 capsule (30 mg total) by mouth daily. 30 capsule 0   No current facility-administered medications for this visit.     ROS: See HPI for pertinent positives and negatives.   Physical Examination  Vitals:   02/15/19 0905  BP: 135/81  Pulse: 100  Temp: 98 F (36.7 C)  TempSrc: Temporal  SpO2: 95%   There is no height or weight on file to calculate BMI.  General: A&O x 3, WDWN, male. Gait: seated in his motorized w/c HEENT: No gross abnormalities.   Pulmonary: Respirations are moderately labored at rest, limited air movement in all fields, wheezes throughout, crackles and rhonchi in posterior fields Cardiac: regular rhythm, no detected murmur.         Carotid Bruits Right Left   Negative Negative   Radial pulses are 2+ palpable bilaterally   Adominal aortic pulse is not palpable                         VASCULAR EXAM: Extremities without ischemic changes, without Gangrene; without open wounds. S/p left AKA. Right lower leg and foot have moderate erythema (erythema fades at mid lower leg to proximally), 1+ nonpitting edema. Right foot is tender to touch.                                                                                                           LE Pulses Right Left       FEMORAL  not palpable seated in his w/c  not palpable        POPLITEAL  not palpable   AKA       POSTERIOR TIBIAL  not palpable   AKA        DORSALIS PEDIS      ANTERIOR TIBIAL not palpable  AKA    Abdomen: soft, NT, no palpable masses. Skin: no rashes, no ulcers noted.See Extremities Musculoskeletal: no muscle wasting or atrophy.See Extremities.   Neurologic: A&O X 3; appropriate affect, Sensation is normal; MOTOR FUNCTION:  moving all extremities equally, motor strength 5/5 in upper extremities, 4/5 in LE's. Speech is fluent/normal. CN 2-12  intact. Psychiatric: Thought content is normal, mood appropriate for clinical situation.    DATA  Aortoiliac Duplex (02-15-19): Abdominal Aorta Findings: +-------------+-------+----------+----------+--------+--------+---------------+ Location     AP (cm)Trans (cm)PSV (cm/s)WaveformThrombusComments        +-------------+-------+----------+----------+--------+--------+---------------+ Distal                        36                                        +-------------+-------+----------+----------+--------+--------+---------------+ RT CIA Prox                   54                                         +-------------+-------+----------+----------+--------+--------+---------------+ RT CIA Mid                    145                       calcific plaque +-------------+-------+----------+----------+--------+--------+---------------+ RT EIA Prox                   60                                        +-------------+-------+----------+----------+--------+--------+---------------+ RT EIA Distal                 56                                        +-------------+-------+----------+----------+--------+--------+---------------+ LT EIA Distal                 53                                        +-------------+-------+----------+----------+--------+--------+---------------+  Unable to visualize the left CIA or proximal EIA due to bowel and patient discomfort. Summary: Patent distal aorta and right iliac system. Unable to visualize the left CIA or proximal EIA. Patent distal EIA.   ABI (Date: 02/15/2019):  ABI Findings: +---------+------------------+-----+----------+--------+ Right    Rt Pressure (mmHg)IndexWaveform  Comment  +---------+------------------+-----+----------+--------+ Brachial 124                                       +---------+------------------+-----+----------+--------+ ATA      103               0.83 monophasic         +---------+------------------+-----+----------+--------+ PTA      91                0.73 monophasic         +---------+------------------+-----+----------+--------+ Great Toe92                0.74                    +---------+------------------+-----+----------+--------+  +--------+------------------+-----+--------+-------+  Left    Lt Pressure (mmHg)IndexWaveformComment +--------+------------------+-----+--------+-------+ [id number]                                     +--------+------------------+-----+--------+-------+  +-------+-----------+-----------+------------+------------+ ABI/TBIToday's ABIToday's TBIPrevious ABIPrevious TBI +-------+-----------+-----------+------------+------------+ Right  0.83       0.74       0.79        not done     +-------+-----------+-----------+------------+------------+ Left   AKA                                            +-------+-----------+-----------+------------+------------+  Right ABIs appear essentially unchanged compared to prior study on 11//03/2016. Summary: Right: Resting right ankle-brachial index indicates mild right lower extremity arterial disease. The right toe-brachial index is normal. RT great toe pressure = 92 mmHg.  Left: AKA.      ASSESSMENT: URIYAH RASKA is a 78 y.o. male who is s/p stenting of his right superficial femoral artery on 07/09/2013. He has a chronic contracture of his right leg;  he has a left above-knee amputation but has not been able to use his prosthesis.    - PAOD: no signs of ischemia in his right LE, s/p left AKA. Right ABI today shows mild disease, monophasic waveforms, normal TBI. Bilateral aortoiliac duplex today shows no significant stenosis of the right CIA stent, left CIA stent was not visualized.   His atherosclerotic risk factors include 60 years of smoking (1 ppd currently), currently controlled DM, immobility, CAD, and COPD.    - Mild cellulitis right lower leg and foot: allergy to PCN, doxycycline 100 mg po bid x 10 days, disp #20, 0 refills. He presented with what appeared to be cellulitis of the right foot and lower leg in 2017, DVT study at that time was negative. His right foot is kept in a dependent position in his w/c. I discussed with him elevating his right foot above his heart for 20 minutes 4x/day and overnight to minimize dependent edema.    - Pain and swelling right foot and lower leg x 1-2 months: return on 02-20-19 for  right LE venous duplex to evaluate for DVT.    - Dyspnea, COPD: use of his rescue inhalers, follow up with is PCP, smoking cessation.    PLAN:  Based on the patient's vascular studies and examination, pt will return to clinic on 02-20-19 for right LE venosus duplex to evaluate for DVT, see me afterward.   Follow up in 1 year with aortoiliac duplex and right leg ABI.   Over 3 minutes was spent counseling patient re smoking cessation, and patient was given several free resources re smoking cessation.  I discussed in depth with the patient the nature of atherosclerosis, and emphasized the importance of maximal medical management including strict control of blood pressure, blood glucose, and lipid levels, obtaining regular exercise, and cessation of smoking.  The patient is aware that without maximal medical management the underlying atherosclerotic disease process will progress, limiting the benefit of any interventions.  The patient was given information about PAD including signs, symptoms, treatment, what symptoms should prompt the patient to seek immediate medical care, and risk reduction measures to take.  [name], RN, MSN, FNP-C [facility] Phone: [phone]  Clinic MD: Donzetta Matters  02/15/19 9:25  AM

## 2019-02-18 ENCOUNTER — Other Ambulatory Visit: Payer: Self-pay

## 2019-02-18 DIAGNOSIS — L03115 Cellulitis of right lower limb: Secondary | ICD-10-CM

## 2019-02-20 ENCOUNTER — Ambulatory Visit (HOSPITAL_COMMUNITY)
Admission: RE | Admit: 2019-02-20 | Discharge: 2019-02-20 | Disposition: A | Discharge status: Home / Self Care | Payer: Medicare HMO | Source: Ambulatory Visit | Attending: Family | Admitting: Family

## 2019-02-20 ENCOUNTER — Encounter: Payer: Self-pay | Admitting: Family

## 2019-02-20 ENCOUNTER — Ambulatory Visit (INDEPENDENT_AMBULATORY_CARE_PROVIDER_SITE_OTHER): Payer: Medicare HMO | Admitting: Family

## 2019-02-20 ENCOUNTER — Other Ambulatory Visit: Payer: Self-pay

## 2019-02-20 VITALS — BP 120/83 | HR 102 | Temp 97.7°F | Resp 16 | Ht 75.0 in | Wt 170.0 lb

## 2019-02-20 DIAGNOSIS — Z89612 Acquired absence of left leg above knee: Secondary | ICD-10-CM

## 2019-02-20 DIAGNOSIS — L03115 Cellulitis of right lower limb: Secondary | ICD-10-CM | POA: Insufficient documentation

## 2019-02-20 DIAGNOSIS — F17200 Nicotine dependence, unspecified, uncomplicated: Secondary | ICD-10-CM | POA: Diagnosis not present

## 2019-02-20 DIAGNOSIS — I779 Disorder of arteries and arterioles, unspecified: Secondary | ICD-10-CM | POA: Diagnosis not present

## 2019-02-20 DIAGNOSIS — R609 Edema, unspecified: Secondary | ICD-10-CM

## 2019-02-20 DIAGNOSIS — R69 Illness, unspecified: Secondary | ICD-10-CM | POA: Diagnosis not present

## 2019-02-20 NOTE — Progress Notes (Signed)
[facility] [place]   CC: Follow up peripheral artery occlusive disease  History of Present Illness JSON KOELZER is a 78 y.o. male who is s/pstenting of his right superficial femoral artery on 07/09/2013 by Dr. Trula Slade. He has a chronic contracture of his rightleg;he has a left above-knee amputation but has not been able to use his prosthesis. He gets around by motorized wheelchair. In the past he hascomplained ofswelling in his right leg as well as feeling very cold knee.   The patient continues to smoke. He has DM, seems to be in control. He takes Neurontin for his neuropathic pain. He has a history of coronary artery diseasewith 5 vessel CABG about 2008 in [place].  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   On 02/03/15 Dr. Trula Slade performed an arteriogram. Findings were as follows: #1 no significant aortoiliac occlusion or stenosis #2 right lower extremity reveals a widely patent stent and no significant change from previous imaging. There is diffuse disease throughout the femoral-popliteal segment with no stenosis being greater than 30%.  Dr. Trula Slade last evaluated pt on 05-29-17. At that time he discussed with the patient, that he did not feel there was anything additional from a vascular perspective and he could provide. Pt had palpable femoral pulses and his duplex showed the aortoiliac system remained widely patent. Dr. Trula Slade indicated that he thought pt is suffering from nerve pain. This could either be ischemic neuropathy in nature or it could be secondary to his degenerative back disease. He was scheduled to undergo injections of his back the next day. Dr. Trula Slade was optimistic that this would provide him with some relief. He is going to need long-term management by a pain service.  He states he is not in pain management, but states that his doctor prescribes hydrocodone for him.  He has multiple complaints: loss of appetite,  right foot and lower leg cold sensation, left AKA stump stabbing pain, and worsening dyspnea.  He denies wounds in his foot or leg, denies wounds in his left AKA stump.   Diabetic: Yes, A1C 6.2 in October 2017(review of records), he does not know his last A1C  Tobacco use: smoker (1ppd, started at age 89yrs, quit a few times)   Pt meds include: Statin :No Betablocker: No ASA: Yes Other anticoagulants/antiplatelets: no   Past Medical History:  Diagnosis Date  . Anginal pain ([place])   . Arthritis    "qwhere" (07/09/2013)  . Asthma   . COPD (chronic obstructive pulmonary disease) ([place])    pt denies this hx on 07/09/2013  . Coronary artery disease   . Gout   . H/O hiatal hernia 1997  . Heart murmur    "had rheumatic fever as a kid" (07/09/2013)  . High cholesterol   . Hypertension   . Myocardial infarction ([place]) 03/2002; ~ 2011  . Neuropathy   . Pneumonia    "once" (07/09/2013)  . Renal disorder    "only have 1 kidney; due to go back soon to check the other one" (07/09/2013)  . Rheumatic fever   . RUPTURE ROTATOR CUFF 12/03/2009   Qualifier: Diagnosis of  By: Aline Brochure MD, Dorothyann Peng    . Shortness of breath    "can happen at anytime" (07/09/2013)  . Type II diabetes mellitus ([place])   . Ureter cancer ([place]) 12/2001   "shut down my kidney & resulted in nephrectomy" (07/09/2013)  . UTI (urinary tract infection) 10/04/2018    Social History Social History  Tobacco Use  . Smoking status: Current Every Day Smoker    Packs/day: 1.50    Years: 54.00    Pack years: 81.00    Types: Cigarettes  . Smokeless tobacco: Never Used  Substance Use Topics  . Alcohol use: No  . Drug use: No    Family History Family History  Problem Relation Age of Onset  . Cancer Mother   . Heart attack Father     Past Surgical History:  Procedure Laterality Date  . ABOVE KNEE LEG AMPUTATION Left 09/2009  . BELOW KNEE LEG AMPUTATION Left 08/2009  . CARDIAC CATHETERIZATION  ~ 2010   "before the  bypass" (07/09/2013)  . CARPAL TUNNEL RELEASE Bilateral ~2004; ~ 2011  . CHOLECYSTECTOMY  1980's  . CORONARY ARTERY BYPASS GRAFT  ~ 2010   "CABG X5" (07/09/2013)  . EYE SURGERY    . FEMORAL ARTERY STENT Right 07/09/2013  . GLAUCOMA SURGERY Bilateral ?[id number]  . LOWER EXTREMITY ANGIOGRAM Right Nov. 4, 2014  . LOWER EXTREMITY ANGIOGRAM Right 07/09/2013   Procedure: LOWER EXTREMITY ANGIOGRAM;  Surgeon: Serafina Mitchell, MD;  Location: [facility] CATH LAB;  Service: Cardiovascular;  Laterality: Right;  . NEPHRECTOMY Left 12/19/2001  . PERIPHERAL VASCULAR CATHETERIZATION N/A 02/03/2015   Procedure: Abdominal Aortogram;  Surgeon: Serafina Mitchell, MD;  Location: [facility] CV LAB;  Service: Cardiovascular;  Laterality: N/A;  . TRIGGER FINGER RELEASE Left ~ 1990's    Allergies  Allergen Reactions  . Erythromycin Anaphylaxis and Rash  . Iohexol Anaphylaxis, Hives, Itching and Other (See Comments)    Note:  13 HR PRE-MED GIVEN AND SUCCESSFUL-ARS 07/15/07.     Marland Kitchen Penicillins Anaphylaxis, Itching and Other (See Comments)    Has patient had a PCN reaction causing immediate rash, facial/tongue/throat swelling, SOB or lightheadedness with hypotension: Yes Has patient had a PCN reaction causing severe rash involving mucus membranes or skin necrosis: No Has patient had a PCN reaction that required hospitalization No Has patient had a PCN reaction occurring within the last 10 years: No If all of the above answers are "NO", then may proceed with Cephalosporin use.    Current Outpatient Medications  Medication Sig Dispense Refill  . albuterol (PROVENTIL HFA;VENTOLIN HFA) 108 (90 Base) MCG/ACT inhaler Inhale 2 puffs into the lungs every 4 (four) hours as needed for wheezing or shortness of breath.    Marland Kitchen albuterol (PROVENTIL) (2.5 MG/3ML) 0.083% nebulizer solution Take 2.5 mg by nebulization every 6 (six) hours as needed for wheezing or shortness of breath.    Marland Kitchen aspirin EC 81 MG tablet Take 81 mg by mouth at  bedtime.    . cholecalciferol (VITAMIN D) 400 units TABS tablet Take 1,200 Units by mouth 2 (two) times daily.    Marland Kitchen doxycycline (VIBRAMYCIN) 100 MG capsule Take 1 capsule (100 mg total) by mouth 2 (two) times daily. 20 capsule 0  . gabapentin (NEURONTIN) 300 MG capsule Take 1 capsule (300 mg total) by mouth 2 (two) times daily. Pt takes two capsules in the morning and three at bedtime. (Patient taking differently: Take 600-900 mg by mouth See admin instructions. Pt takes two capsules in the morning and three at bedtime.)    . Gauze Pads & Dressings (FOAM DRESSING) PADS 1 patch by Does not apply route daily. 30 each 0  . Insulin Glargine (TOUJEO SOLOSTAR) 300 UNIT/ML SOPN Inject 5 Units into the skin daily as needed (for BS greater than 200).     . metFORMIN (GLUCOPHAGE) 500 MG tablet  Take 500 mg by mouth 2 (two) times daily with a meal.    . mometasone-formoterol (DULERA) 200-5 MCG/ACT AERO Inhale 2 puffs into the lungs daily as needed for wheezing or shortness of breath.    . nicotine (NICODERM CQ - DOSED IN MG/24 HOURS) 21 mg/24hr patch Place 1 patch (21 mg total) onto the skin daily. 28 patch 0  . nitroGLYCERIN (NITROSTAT) 0.4 MG SL tablet Place 0.4 mg under the tongue every 5 (five) minutes as needed for chest pain.    Marland Kitchen QUEtiapine (SEROQUEL) 300 MG tablet Take 300 mg by mouth at bedtime.    . Tamsulosin HCl (FLOMAX) 0.4 MG CAPS Take 0.4 mg by mouth 2 (two) times daily.     . triazolam (HALCION) 0.25 MG tablet Take 0.5 mg by mouth at bedtime as needed for sleep.     . DULoxetine (CYMBALTA) 30 MG capsule Take 1 capsule (30 mg total) by mouth daily. 30 capsule 0   No current facility-administered medications for this visit.     ROS: See HPI for pertinent positives and negatives.   Physical Examination  Vitals:   02/20/19 1429 02/20/19 1433  BP: 123/80 120/83  Pulse: (!) 102   Resp: 16   Temp: 97.7 F (36.5 C)   TempSrc: Oral   SpO2: 100%   Weight: 170 lb (77.1 kg)   Height: 6\' 3"   (1.905 m)    Body mass index is 21.25 kg/m.  General: A&O x 3, WDWN, male. Gait: seated in w/c HENT: No gross abnormalities.  Eyes: Pupils are equal Pulmonary: Respirations are moderately labored at rest, fair air movement in all fields, no wheezes; rales and rhonchi clear with cough which is loose and moist Cardiac: regular rhythm, no detected murmur.         Carotid Bruits Right Left   Negative Negative   Radial pulses are 2+ palpable bilaterally   Adominal aortic pulse is not palpable                         VASCULAR EXAM: Extremities without ischemic changes, without Gangrene; without open wounds. S/p left AKA. Right forefoot has moderate erythema, improved from his visit on 02-15-19, 1+ nonpitting dependent edema. Right foot is no longer tender to touch. Right forefoot is cool to touch.                                                                                                                  LE Pulses Right Left       FEMORAL  not palpable seated in w/c  not palpable        POPLITEAL  not palpable   AKA       POSTERIOR TIBIAL  not palpable   AKA        DORSALIS PEDIS      ANTERIOR TIBIAL not palpable  AKA    Abdomen: soft, NT, no palpable masses. Skin: no rashes, no cellulitis, no ulcers noted. See Extremities.  Musculoskeletal: no muscle wasting or atrophy. See Extremities.   Neurologic: A&O X 3; appropriate affect, Sensation is normal; MOTOR FUNCTION:  moving all extremities equally, motor strength 5/5 throughout. Speech is fluent/normal. CN 2-12 intact. Psychiatric: Thought content is normal, mood appropriate for clinical situation.     ASSESSMENT: MELBOURNE JAKUBIAK is a 78 y.o. male who is s/pstenting of his right superficial femoral artery on 07/09/2013. He has a chronic contracture of his rightknee;he has a left above-knee amputation but has not been able to use his prosthesis.    - PAOD: no signs of ischemia in his right LE, s/p left AKA.  Right ABI on 02-15-19 showed mild disease, monophasic waveforms, normal TBI. Bilateral aortoiliac duplex that day showed no significant stenosis of the right CIA stent, left CIA stent was not visualized.    - dependent edema of right foot and lower legs: kept dependent sitting in his w/c. Does not appear to have cellulitis, he is to continue doxycycline for the 10 day course.  He presented with what appeared to be cellulitis of the right foot and lower leg in 2017, DVT study at that time was negative, and is again negative today. His right foot is kept in a dependent position in his w/c. I again discussed with him elevating his right foot above his heart for 20 minutes 4x/day and overnight to minimize dependent edema.    - Dyspnea, COPD: use of his rescue inhalers, follow up with is PCP, smoking cessation.   - Pain of right AKA: he takes hydrocodone and flexeril for this prescribed by his PCP. No wounds at his left AKA.   His atherosclerotic risk factors include 60 years of smoking (1 ppd currently), currently controlled DM, immobility, CAD, and COPD.    DATA  Right LE Venous Duplex (02-20-19); +---------+---------------+---------+-----------+----------+--------------+ RIGHT    CompressibilityPhasicitySpontaneityPropertiesSummary        +---------+---------------+---------+-----------+----------+--------------+ CFV      Full                                                        +---------+---------------+---------+-----------+----------+--------------+ SFJ      Full                                                        +---------+---------------+---------+-----------+----------+--------------+ FV Prox  Full                                                        +---------+---------------+---------+-----------+----------+--------------+ FV Mid   Full                                                         +---------+---------------+---------+-----------+----------+--------------+ FV DistalFull                                                        +---------+---------------+---------+-----------+----------+--------------+  POP      Full                                                        +---------+---------------+---------+-----------+----------+--------------+ PTV                                                   Not visualized +---------+---------------+---------+-----------+----------+--------------+ PERO                                                  Not visualized +---------+---------------+---------+-----------+----------+--------------+ Summary: Right: There is no evidence of deep vein thrombosis in the lower extremity.  Unable to adequately visualize the calf veins due to edema.    PLAN:  Based on the patient's vascular studies and examination, pt will return to clinic in 1 year with aortoiliac duplex and right ABI.  Daily seated leg exercises.  Tobacco use: Over 3 minutes was spent counseling patient re smoking cessation, and patient was given several free resources re smoking cessation.  Dependent edema right foot, no cellultis: Elevation of right foot above heart 4x/day for 20 minutes and overnight.  Cold sensation in right lower leg, on exam normal temperature to touch; is possibly DM neuropathy or lumbar spine issues.   Stabbing pain sensation in left AKA stump, no longer has phantom pain: I offered to refer him to pain management but he declined. He sates that he receives hydrocodone and flexeril scripts from his PCP for this.   I discussed in depth with the patient the nature of atherosclerosis, and emphasized the importance of maximal medical management including strict control of blood pressure, blood glucose, and lipid levels, obtaining regular exercise, and cessation of smoking.  The patient is aware that without maximal medical  management the underlying atherosclerotic disease process will progress, limiting the benefit of any interventions.  The patient was given information about PAD including signs, symptoms, treatment, what symptoms should prompt the patient to seek immediate medical care, and risk reduction measures to take.  Clemon Chambers, RN, MSN, FNP-C [facility] Phone: [phone]  Clinic MD: Laqueta Due  02/20/19 2:56 PM

## 2019-02-20 NOTE — Patient Instructions (Addendum)
Steps to Quit Smoking  Smoking tobacco can be bad for your health. It can also affect almost every organ in your body. Smoking puts you and people around you at risk for many serious long-lasting (chronic) diseases. Quitting smoking is hard, but it is one of the best things that you can do for your health. It is never too late to quit. What are the benefits of quitting smoking? When you quit smoking, you lower your risk for getting serious diseases and conditions. They can include:  Lung cancer or lung disease.  Heart disease.  Stroke.  Heart attack.  Not being able to have children (infertility).  Weak bones (osteoporosis) and broken bones (fractures). If you have coughing, wheezing, and shortness of breath, those symptoms may get better when you quit. You may also get sick less often. If you are pregnant, quitting smoking can help to lower your chances of having a baby of low birth weight. What can I do to help me quit smoking? Talk with your doctor about what can help you quit smoking. Some things you can do (strategies) include:  Quitting smoking totally, instead of slowly cutting back how much you smoke over a period of time.  Going to in-person counseling. You are more likely to quit if you go to many counseling sessions.  Using resources and support systems, such as: ? Online chats with a counselor. ? Phone quitlines. ? Printed self-help materials. ? Support groups or group counseling. ? Text messaging programs. ? Mobile phone apps or applications.  Taking medicines. Some of these medicines may have nicotine in them. If you are pregnant or breastfeeding, do not take any medicines to quit smoking unless your doctor says it is okay. Talk with your doctor about counseling or other things that can help you. Talk with your doctor about using more than one strategy at the same time, such as taking medicines while you are also going to in-person counseling. This can help make  quitting easier. What things can I do to make it easier to quit? Quitting smoking might feel very hard at first, but there is a lot that you can do to make it easier. Take these steps:  Talk to your family and friends. Ask them to support and encourage you.  Call phone quitlines, reach out to support groups, or work with a counselor.  Ask people who smoke to not smoke around you.  Avoid places that make you want (trigger) to smoke, such as: ? Bars. ? Parties. ? Smoke-break areas at work.  Spend time with people who do not smoke.  Lower the stress in your life. Stress can make you want to smoke. Try these things to help your stress: ? Getting regular exercise. ? Deep-breathing exercises. ? Yoga. ? Meditating. ? Doing a body scan. To do this, close your eyes, focus on one area of your body at a time from head to toe, and notice which parts of your body are tense. Try to relax the muscles in those areas.  Download or buy apps on your mobile phone or tablet that can help you stick to your quit plan. There are many free apps, such as QuitGuide from the CDC (Centers for Disease Control and Prevention). You can find more support from smokefree.gov and other websites. This information is not intended to replace advice given to you by your health care provider. Make sure you discuss any questions you have with your health care provider. Document Released: 06/18/2009 Document Revised: 04/19/2016   Document Reviewed: 01/06/2015 Elsevier Interactive Patient Education  2019 [place].     Peripheral Vascular Disease  Peripheral vascular disease (PVD) is a disease of the blood vessels that are not part of your heart and brain. A simple term for PVD is poor circulation. In most cases, PVD narrows the blood vessels that carry blood from your heart to the rest of your body. This can reduce the supply of blood to your arms, legs, and internal organs, like your stomach or kidneys. However, PVD most  often affects a person's lower legs and feet. Without treatment, PVD tends to get worse. PVD can also lead to acute ischemic limb. This is when an arm or leg suddenly cannot get enough blood. This is a medical emergency. Follow these instructions at home: Lifestyle  Do not use any products that contain nicotine or tobacco, such as cigarettes and e-cigarettes. If you need help quitting, ask your doctor.  Lose weight if you are overweight. Or, stay at a healthy weight as told by your doctor.  Eat a diet that is low in fat and cholesterol. If you need help, ask your doctor.  Exercise regularly. Ask your doctor for activities that are right for you. General instructions  Take over-the-counter and prescription medicines only as told by your doctor.  Take good care of your feet: ? Wear comfortable shoes that fit well. ? Check your feet often for any cuts or sores.  Keep all follow-up visits as told by your doctor This is important. Contact a doctor if:  You have cramps in your legs when you walk.  You have leg pain when you are at rest.  You have coldness in a leg or foot.  Your skin changes.  You are unable to get or have an erection (erectile dysfunction).  You have cuts or sores on your feet that do not heal. Get help right away if:  Your arm or leg turns cold, numb, and blue.  Your arms or legs become red, warm, swollen, painful, or numb.  You have chest pain.  You have trouble breathing.  You suddenly have weakness in your face, arm, or leg.  You become very confused or you cannot speak.  You suddenly have a very bad headache.  You suddenly cannot see. Summary  Peripheral vascular disease (PVD) is a disease of the blood vessels.  A simple term for PVD is poor circulation. Without treatment, PVD tends to get worse.  Treatment may include exercise, low fat and low cholesterol diet, and quitting smoking. This information is not intended to replace advice given to  you by your health care provider. Make sure you discuss any questions you have with your health care provider. Document Released: 11/16/2009 Document Revised: 09/29/2016 Document Reviewed: 09/29/2016 Elsevier Interactive Patient Education  2019 [employer].     To decrease swelling in your feet and legs: Elevate feet above slightly bent knees, feet above heart, overnight and 3-4 times per day for 20 minutes.     Peripheral Vascular Disease  Peripheral vascular disease (PVD) is a disease of the blood vessels that are not part of your heart and brain. A simple term for PVD is poor circulation. In most cases, PVD narrows the blood vessels that carry blood from your heart to the rest of your body. This can reduce the supply of blood to your arms, legs, and internal organs, like your stomach or kidneys. However, PVD most often affects a person's lower legs and feet. Without treatment,  PVD tends to get worse. PVD can also lead to acute ischemic limb. This is when an arm or leg suddenly cannot get enough blood. This is a medical emergency. Follow these instructions at home: Lifestyle  Do not use any products that contain nicotine or tobacco, such as cigarettes and e-cigarettes. If you need help quitting, ask your doctor.  Lose weight if you are overweight. Or, stay at a healthy weight as told by your doctor.  Eat a diet that is low in fat and cholesterol. If you need help, ask your doctor.  Exercise regularly. Ask your doctor for activities that are right for you. General instructions  Take over-the-counter and prescription medicines only as told by your doctor.  Take good care of your feet: ? Wear comfortable shoes that fit well. ? Check your feet often for any cuts or sores.  Keep all follow-up visits as told by your doctor This is important. Contact a doctor if:  You have cramps in your legs when you walk.  You have leg pain when you are at rest.  You have coldness in a leg or  foot.  Your skin changes.  You are unable to get or have an erection (erectile dysfunction).  You have cuts or sores on your feet that do not heal. Get help right away if:  Your arm or leg turns cold, numb, and blue.  Your arms or legs become red, warm, swollen, painful, or numb.  You have chest pain.  You have trouble breathing.  You suddenly have weakness in your face, arm, or leg.  You become very confused or you cannot speak.  You suddenly have a very bad headache.  You suddenly cannot see. Summary  Peripheral vascular disease (PVD) is a disease of the blood vessels.  A simple term for PVD is poor circulation. Without treatment, PVD tends to get worse.  Treatment may include exercise, low fat and low cholesterol diet, and quitting smoking. This information is not intended to replace advice given to you by your health care provider. Make sure you discuss any questions you have with your health care provider. Document Released: 11/16/2009 Document Revised: 09/29/2016 Document Reviewed: 09/29/2016 Elsevier Interactive Patient Education  2019 [employer].

## 2019-02-25 DIAGNOSIS — J449 Chronic obstructive pulmonary disease, unspecified: Secondary | ICD-10-CM | POA: Diagnosis not present

## 2019-02-25 DIAGNOSIS — E7849 Other hyperlipidemia: Secondary | ICD-10-CM | POA: Diagnosis not present

## 2019-02-25 DIAGNOSIS — M2550 Pain in unspecified joint: Secondary | ICD-10-CM | POA: Diagnosis not present

## 2019-02-25 DIAGNOSIS — E1165 Type 2 diabetes mellitus with hyperglycemia: Secondary | ICD-10-CM | POA: Diagnosis not present

## 2019-02-27 IMAGING — DX DG ABDOMEN ACUTE W/ 1V CHEST
4 series · 5 of 5 positions shown · non-contrast
Comparison: 07/01/2016, 03/31/2017

CLINICAL DATA: Left lower quadrant abdominal pain

EXAM:
DG ABDOMEN ACUTE W/ 1V CHEST

[abdomen erect]
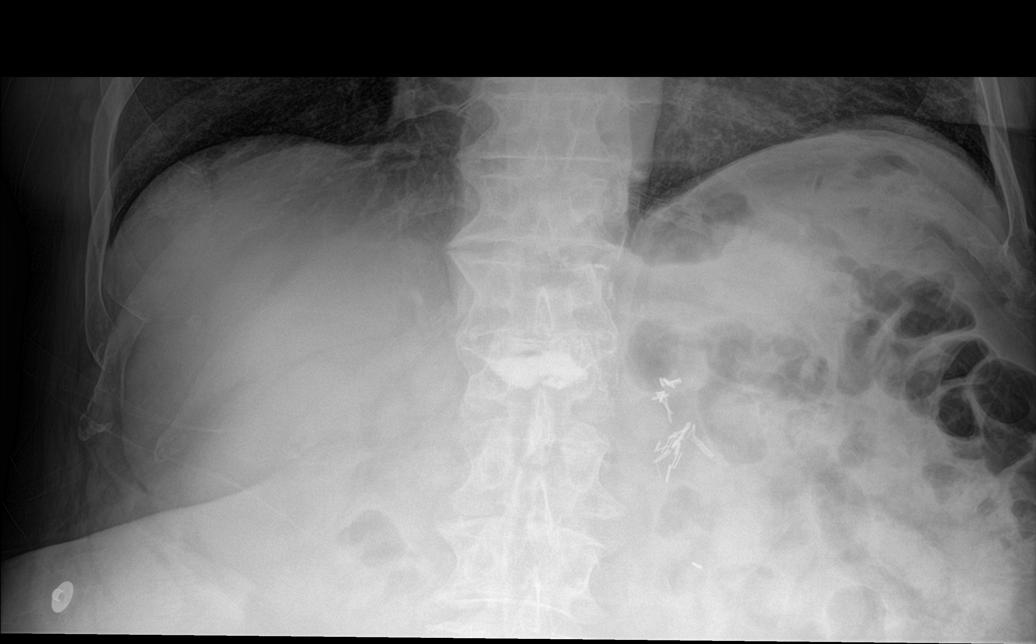

[abdomen supine (1 of 2)]
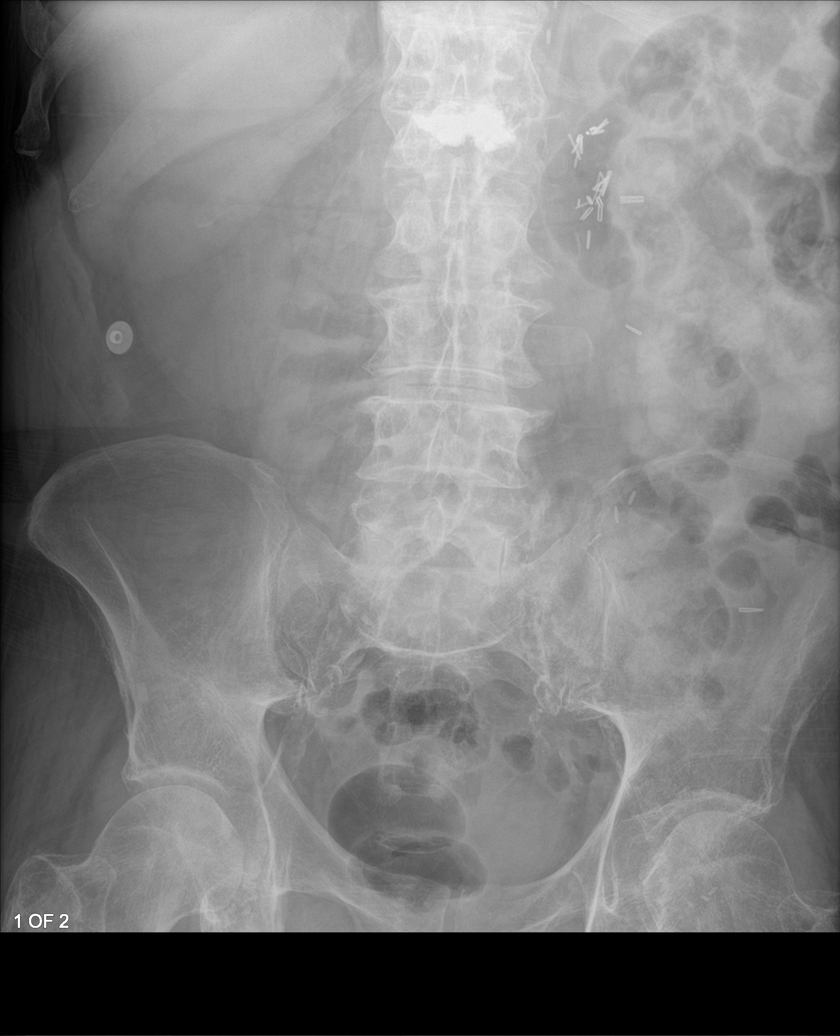

[abdomen supine (2 of 2)]
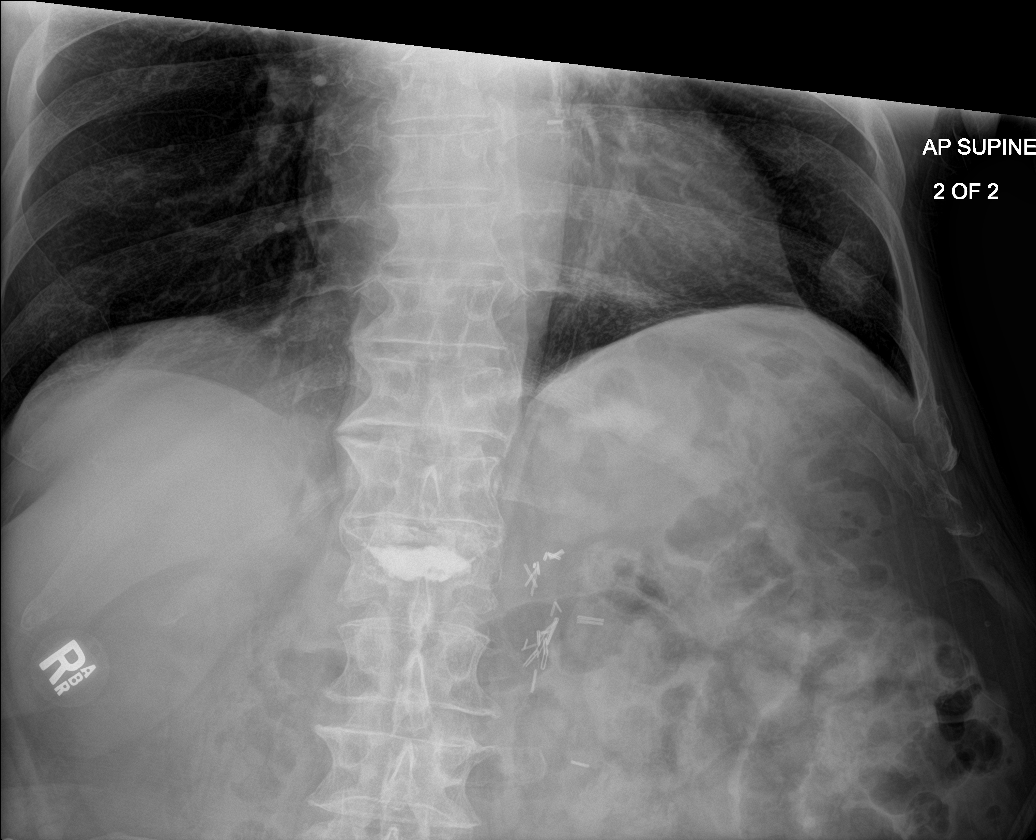

[Series 5: chest ap · 0.14mm/px · 2 of 2 slices shown]
[im 1/2]
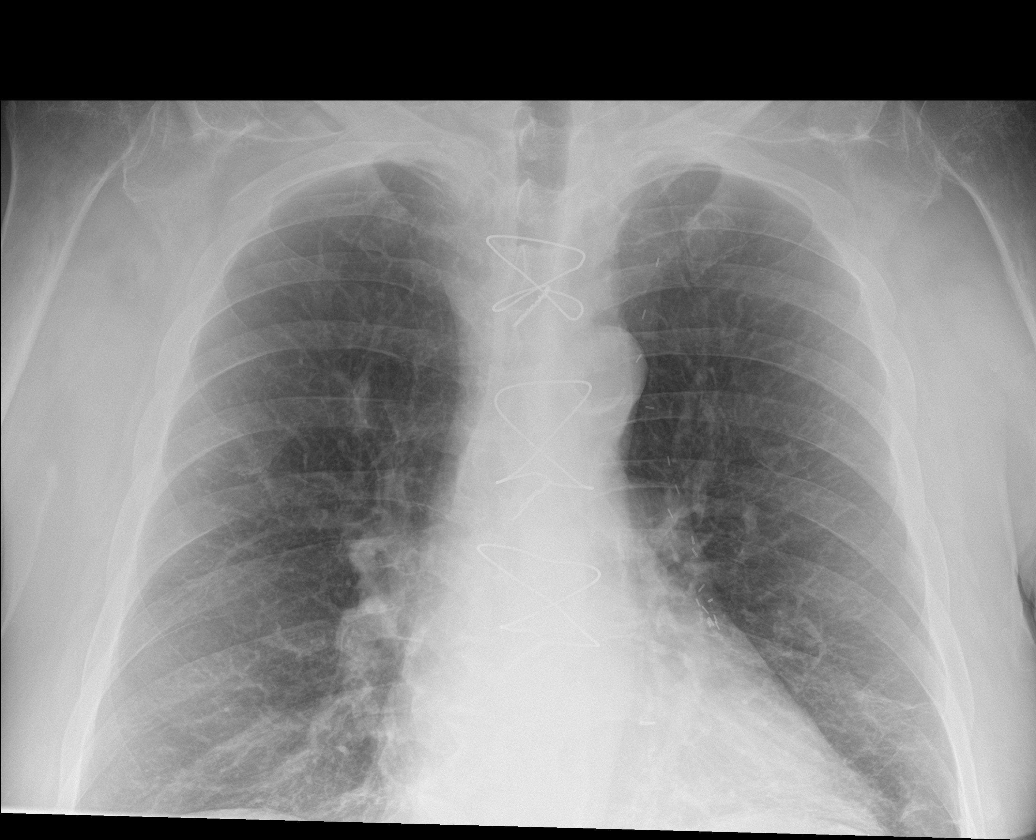
[im 2/2]
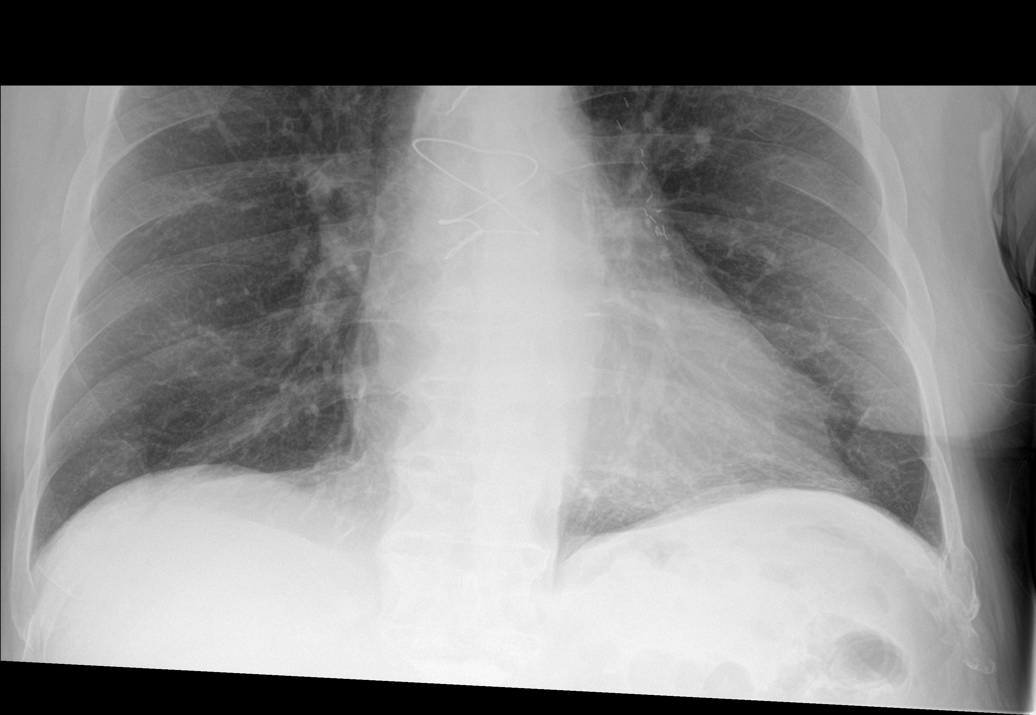

[5 of 5 positions shown; findings below may reference images not displayed]

FINDINGS: Single-view chest demonstrates median sternotomy changes. Linear
atelectasis or scarring at the left lung base. Stable
cardiomediastinal silhouette with aortic atherosclerosis.

Supine and upright views of the abdomen demonstrate no free air
beneath the diaphragm. Surgical clips in the upper abdomen.
Nonobstructed gas pattern with moderate stool. No abnormal
calcification. Vascular calcification. Partially visualized vascular
stent in the right groin. Treated compression at L1.
IMPRESSION: 1. Linear scarring or atelectasis at the left base
2. Nonobstructed bowel-gas pattern

## 2019-03-05 DIAGNOSIS — R5382 Chronic fatigue, unspecified: Secondary | ICD-10-CM | POA: Diagnosis not present

## 2019-03-05 DIAGNOSIS — R21 Rash and other nonspecific skin eruption: Secondary | ICD-10-CM | POA: Diagnosis not present

## 2019-03-05 DIAGNOSIS — G8929 Other chronic pain: Secondary | ICD-10-CM | POA: Diagnosis not present

## 2019-03-08 ENCOUNTER — Ambulatory Visit
Admission: EM | Admit: 2019-03-08 | Discharge: 2019-03-08 | Disposition: A | Discharge status: Home / Self Care | Payer: Medicare HMO | Attending: Emergency Medicine | Admitting: Emergency Medicine

## 2019-03-08 ENCOUNTER — Other Ambulatory Visit: Payer: Self-pay

## 2019-03-08 DIAGNOSIS — L89301 Pressure ulcer of unspecified buttock, stage 1: Secondary | ICD-10-CM

## 2019-03-08 MED ORDER — LANTISEPTIC SKIN PROTECTANT 50 % EX OINT: TOPICAL_OINTMENT | CUTANEOUS | 0 refills | Status: AC

## 2019-03-08 NOTE — ED Provider Notes (Signed)
[name]   [id number] 03/08/19 Arrival Time: 7425  CC:  "Abscess"  SUBJECTIVE:  [name] is a 78 y.o. male hx significant for arthritis, asthma, COPD, CAD, gout, H/O hiatal hernia, HLD, MI, neuropathy, 1 kidney, type 2 DM, ureter cancer, who presents with a possible abscess to tailbone x 1 week.  Hx significant for LT BKA and uses a motorized wheelchair.  Hx of pilonidal cyst as well with prior surgery. Was seen by PCP this week and treated with doxycycline and anti-fungal cream.  Denies improvement in symptoms with medications. Symptoms are made worse with sitting.  Denies similar symptoms in the past.   Complains of associated redness and swelling.  Complains of associated nausea.  Denies fever, chills, vomiting, discharge, SOB, chest pain.    ROS: As per HPI.  Past Medical History:  Diagnosis Date  . Anginal pain ([place])   . Arthritis    "qwhere" (07/09/2013)  . Asthma   . COPD (chronic obstructive pulmonary disease) ([place])    pt denies this hx on 07/09/2013  . Coronary artery disease   . Gout   . H/O hiatal hernia 1997  . Heart murmur    "had rheumatic fever as a kid" (07/09/2013)  . High cholesterol   . Hypertension   . Myocardial infarction ([place]) 03/2002; ~ 2011  . Neuropathy   . Pneumonia    "once" (07/09/2013)  . Renal disorder    "only have 1 kidney; due to go back soon to check the other one" (07/09/2013)  . Rheumatic fever   . RUPTURE ROTATOR CUFF 12/03/2009   Qualifier: Diagnosis of  By: Aline Brochure MD, Dorothyann Peng    . Shortness of breath    "can happen at anytime" (07/09/2013)  . Type II diabetes mellitus ([place])   . Ureter cancer ([place]) 12/2001   "shut down my kidney & resulted in nephrectomy" (07/09/2013)  . UTI (urinary tract infection) 10/04/2018   Past Surgical History:  Procedure Laterality Date  . ABOVE KNEE LEG AMPUTATION Left 09/2009  . BELOW KNEE LEG AMPUTATION Left 08/2009  . CARDIAC CATHETERIZATION  ~ 2010   "before the bypass" (07/09/2013)  .  CARPAL TUNNEL RELEASE Bilateral ~2004; ~ 2011  . CHOLECYSTECTOMY  1980's  . CORONARY ARTERY BYPASS GRAFT  ~ 2010   "CABG X5" (07/09/2013)  . EYE SURGERY    . FEMORAL ARTERY STENT Right 07/09/2013  . GLAUCOMA SURGERY Bilateral ?[id number]  . LOWER EXTREMITY ANGIOGRAM Right Nov. 4, 2014  . LOWER EXTREMITY ANGIOGRAM Right 07/09/2013   Procedure: LOWER EXTREMITY ANGIOGRAM;  Surgeon: Serafina Mitchell, MD;  Location: [facility] CATH LAB;  Service: Cardiovascular;  Laterality: Right;  . NEPHRECTOMY Left 12/19/2001  . PERIPHERAL VASCULAR CATHETERIZATION N/A 02/03/2015   Procedure: Abdominal Aortogram;  Surgeon: Serafina Mitchell, MD;  Location: [facility] CV LAB;  Service: Cardiovascular;  Laterality: N/A;  . TRIGGER FINGER RELEASE Left ~ 1990's   Allergies  Allergen Reactions  . Erythromycin Anaphylaxis and Rash  . Iohexol Anaphylaxis, Hives, Itching and Other (See Comments)    Note:  13 HR PRE-MED GIVEN AND SUCCESSFUL-ARS 07/15/07.     Marland Kitchen Penicillins Anaphylaxis, Itching and Other (See Comments)    Has patient had a PCN reaction causing immediate rash, facial/tongue/throat swelling, SOB or lightheadedness with hypotension: Yes Has patient had a PCN reaction causing severe rash involving mucus membranes or skin necrosis: No Has patient had a PCN reaction that required hospitalization No Has patient had a PCN reaction occurring within the  last 10 years: No If all of the above answers are "NO", then may proceed with Cephalosporin use.   No current facility-administered medications on file prior to encounter.    Current Outpatient Medications on File Prior to Encounter  Medication Sig Dispense Refill  . doxycycline (VIBRAMYCIN) 100 MG capsule Take 1 capsule (100 mg total) by mouth 2 (two) times daily. 20 capsule 0  . albuterol (PROVENTIL HFA;VENTOLIN HFA) 108 (90 Base) MCG/ACT inhaler Inhale 2 puffs into the lungs every 4 (four) hours as needed for wheezing or shortness of breath.    Marland Kitchen albuterol (PROVENTIL)  (2.5 MG/3ML) 0.083% nebulizer solution Take 2.5 mg by nebulization every 6 (six) hours as needed for wheezing or shortness of breath.    Marland Kitchen aspirin EC 81 MG tablet Take 81 mg by mouth at bedtime.    . cholecalciferol (VITAMIN D) 400 units TABS tablet Take 1,200 Units by mouth 2 (two) times daily.    . DULoxetine (CYMBALTA) 30 MG capsule Take 1 capsule (30 mg total) by mouth daily. 30 capsule 0  . gabapentin (NEURONTIN) 300 MG capsule Take 1 capsule (300 mg total) by mouth 2 (two) times daily. Pt takes two capsules in the morning and three at bedtime. (Patient taking differently: Take 600-900 mg by mouth See admin instructions. Pt takes two capsules in the morning and three at bedtime.)    . Gauze Pads & Dressings (FOAM DRESSING) PADS 1 patch by Does not apply route daily. 30 each 0  . Insulin Glargine (TOUJEO SOLOSTAR) 300 UNIT/ML SOPN Inject 5 Units into the skin daily as needed (for BS greater than 200).     . metFORMIN (GLUCOPHAGE) 500 MG tablet Take 500 mg by mouth 2 (two) times daily with a meal.    . mometasone-formoterol (DULERA) 200-5 MCG/ACT AERO Inhale 2 puffs into the lungs daily as needed for wheezing or shortness of breath.    . nicotine (NICODERM CQ - DOSED IN MG/24 HOURS) 21 mg/24hr patch Place 1 patch (21 mg total) onto the skin daily. 28 patch 0  . nitroGLYCERIN (NITROSTAT) 0.4 MG SL tablet Place 0.4 mg under the tongue every 5 (five) minutes as needed for chest pain.    Marland Kitchen QUEtiapine (SEROQUEL) 300 MG tablet Take 300 mg by mouth at bedtime.    . Tamsulosin HCl (FLOMAX) 0.4 MG CAPS Take 0.4 mg by mouth 2 (two) times daily.     . triazolam (HALCION) 0.25 MG tablet Take 0.5 mg by mouth at bedtime as needed for sleep.      Social History   Socioeconomic History  . Marital status: Married    Spouse name: Not on file  . Number of children: Not on file  . Years of education: Not on file  . Highest education level: Not on file  Occupational History  . Not on file  Social Needs  .  Financial resource strain: Not on file  . Food insecurity    Worry: Not on file    Inability: Not on file  . Transportation needs    Medical: Not on file    Non-medical: Not on file  Tobacco Use  . Smoking status: Current Every Day Smoker    Packs/day: 1.50    Years: 54.00    Pack years: 81.00    Types: Cigarettes  . Smokeless tobacco: Never Used  Substance and Sexual Activity  . Alcohol use: No  . Drug use: No  . Sexual activity: Not Currently  Lifestyle  . Physical  activity    Days per week: Not on file    Minutes per session: Not on file  . Stress: Not on file  Relationships  . Social Herbalist on phone: Not on file    Gets together: Not on file    Attends religious service: Not on file    Active member of club or organization: Not on file    Attends meetings of clubs or organizations: Not on file    Relationship status: Not on file  . Intimate partner violence    Fear of current or ex partner: Not on file    Emotionally abused: Not on file    Physically abused: Not on file    Forced sexual activity: Not on file  Other Topics Concern  . Not on file  Social History Narrative  . Not on file   Family History  Problem Relation Age of Onset  . Cancer Mother   . Heart attack Father     OBJECTIVE: Vitals:   03/08/19 1255  BP: 118/77  Pulse: 94  Resp: (!) 22  Temp: 98.3 F (36.8 C)  SpO2: 91%    General appearance: alert; appears chronically ill; no distress Head: NCAT; EOMI grossly Lungs: Normal respiratory effort Extremities: LT BKA; sitting in motorized wheelchair Skin: warm and dry; shallow ulcers with white/ yellow base over tailbone with mild surrounding erythema, exquisitely TTP, no obvious drainage or bleeding (see pictures below) Psychological: alert and cooperative; normal mood and affect        ASSESSMENT & PLAN:  1. Pressure injury of buttock, stage 1, unspecified laterality     Meds ordered this encounter  Medications  .  Skin Protectants, Misc. [facility] SKIN PROTECTANT) 50 % OINT    Sig: Apply to ulcer generously 2-3 times a day, especially following bowel movement    Dispense:  340 g    Refill:  0    Order Specific Question:   Supervising Provider    Answer:   Raylene Everts [[id number]]   Exam consistent with ulcer Wash with warm water and mild soap Recommending further evaluation and management with wound care.  I have included their information on your paperowrk Eat a well-balanced diet; you may supplement with ensure shakes Avoid sitting for long periods of time, or try sitting on doughnut pillow instead Follow up with PCP as needed.  We have attached a list of loca PCPs in the area that are accepting new patients Return or go to the ER if you have any new or worsening symptoms such as fever, chills, nausea, vomiting, increased redness, swelling, discharge, if symptoms do not improve with medications, etc...  Reviewed expectations re: course of current medical issues. Questions answered. Outlined signs and symptoms indicating need for more acute intervention. Patient verbalized understanding. After Visit Summary given.   Lestine Box, PA-C 03/08/19 1450

## 2019-03-08 NOTE — Discharge Instructions (Addendum)
Exam consistent with ulcer Wash with warm water and mild soap Recommending further evaluation and management with wound care.  I have included their information on your paperowrk Eat a well-balanced diet; you may supplement with ensure shakes Avoid sitting for long periods of time, or try sitting on doughnut pillow instead Follow up with PCP as needed.  We have attached a list of loca PCPs in the area that are accepting new patients Return or go to the ER if you have any new or worsening symptoms such as fever, chills, nausea, vomiting, increased redness, swelling, discharge, if symptoms do not improve with medications, etc..Marland Kitchen

## 2019-03-08 NOTE — ED Triage Notes (Signed)
Pt has abscess in perineal area, was seen by pcp on Monday and has gotten worse, pcp closed today

## 2019-03-12 ENCOUNTER — Ambulatory Visit: Payer: Medicare HMO | Admitting: Physician Assistant

## 2019-03-13 ENCOUNTER — Encounter: Payer: Medicare HMO | Attending: Internal Medicine | Admitting: Internal Medicine

## 2019-03-13 ENCOUNTER — Other Ambulatory Visit: Payer: Self-pay

## 2019-03-13 DIAGNOSIS — Z95820 Peripheral vascular angioplasty status with implants and grafts: Secondary | ICD-10-CM | POA: Insufficient documentation

## 2019-03-13 DIAGNOSIS — F17200 Nicotine dependence, unspecified, uncomplicated: Secondary | ICD-10-CM | POA: Diagnosis not present

## 2019-03-13 DIAGNOSIS — I2510 Atherosclerotic heart disease of native coronary artery without angina pectoris: Secondary | ICD-10-CM | POA: Insufficient documentation

## 2019-03-13 DIAGNOSIS — L89153 Pressure ulcer of sacral region, stage 3: Secondary | ICD-10-CM | POA: Insufficient documentation

## 2019-03-13 DIAGNOSIS — Z8554 Personal history of malignant neoplasm of ureter: Secondary | ICD-10-CM | POA: Insufficient documentation

## 2019-03-13 DIAGNOSIS — J449 Chronic obstructive pulmonary disease, unspecified: Secondary | ICD-10-CM | POA: Diagnosis not present

## 2019-03-13 DIAGNOSIS — Z89612 Acquired absence of left leg above knee: Secondary | ICD-10-CM | POA: Insufficient documentation

## 2019-03-13 DIAGNOSIS — L89152 Pressure ulcer of sacral region, stage 2: Secondary | ICD-10-CM | POA: Diagnosis not present

## 2019-03-13 DIAGNOSIS — E1151 Type 2 diabetes mellitus with diabetic peripheral angiopathy without gangrene: Secondary | ICD-10-CM | POA: Insufficient documentation

## 2019-03-13 DIAGNOSIS — Z7984 Long term (current) use of oral hypoglycemic drugs: Secondary | ICD-10-CM | POA: Diagnosis not present

## 2019-03-13 DIAGNOSIS — Z905 Acquired absence of kidney: Secondary | ICD-10-CM | POA: Insufficient documentation

## 2019-03-13 NOTE — Progress Notes (Signed)
AVRY, ROEDL ([id number]) Visit Report for 03/13/2019 Abuse/Suicide Risk Screen Details Patient Name: [name] Date of Service: 03/13/2019 3:15 PM Medical Record Number: [id number] Patient Account Number: [id number] Date of Birth/Sex: 04-28-1941 (78 y.o. Male) Treating RN: Harold Barban Primary Care Priyana Mccarey: Lucia Gaskins Other Clinician: Referring Leyanna Bittman: Lucia Gaskins Treating Yong Grieser/Extender: Ricard Dillon Weeks in Treatment: 0 Abuse/Suicide Risk Screen Items Answer ABUSE RISK SCREEN: Has anyone close to you tried to hurt or harm you recentlyo No Do you feel uncomfortable with anyone in your familyo No Has anyone forced you do things that you didnot want to doo No Electronic Signature(s) Signed: 03/13/2019 4:22:05 PM By: Harold Barban Entered By: Harold Barban on 03/13/2019 15:44:01 [name] ([id number]) -------------------------------------------------------------------------------- Activities of Daily Living Details Patient Name: [name] Date of Service: 03/13/2019 3:15 PM Medical Record Number: [id number] Patient Account Number: [id number] Date of Birth/Sex: 05-22-1941 (78 y.o. Male) Treating RN: Harold Barban Primary Care Emmarose Klinke: Lucia Gaskins Other Clinician: Referring Yoseline Andersson: Lucia Gaskins Treating Barnell Shieh/Extender: Ricard Dillon Weeks in Treatment: 0 Activities of Daily Living Items Answer Activities of Daily Living (Please select one for each item) Drive Automobile Completely Able Take Medications Completely Able Use Telephone Completely Able Care for Appearance Completely Able Use Toilet Completely Able Bath / Shower Completely Able Dress Self Completely Able Feed Self Completely Able Walk Completely Able Get In / Out Bed Completely [place] for Self Completely Able Electronic Signature(s) Signed: 03/13/2019  4:22:05 PM By: Harold Barban Entered By: Harold Barban on 03/13/2019 15:44:19 [name] ([id number]) -------------------------------------------------------------------------------- Education Screening Details Patient Name: [name] Date of Service: 03/13/2019 3:15 PM Medical Record Number: [id number] Patient Account Number: [id number] Date of Birth/Sex: 1941/04/08 (78 y.o. Male) Treating RN: Harold Barban Primary Care Osamu Olguin: Lucia Gaskins Other Clinician: Referring Noeh Sparacino: Lucia Gaskins Treating Pattye Meda/Extender: Tito Dine in Treatment: 0 Primary Learner Assessed: Patient Learning Preferences/Education Level/Primary Language Learning Preference: Explanation Highest Education Level: College or Above Preferred Language: English Cognitive Barrier Language Barrier: No Translator Needed: No Memory Deficit: No Emotional Barrier: No Cultural/Religious Beliefs Affecting Medical Care: No Physical Barrier Impaired Vision: No Impaired Hearing: No Decreased Hand dexterity: No Knowledge/Comprehension Knowledge Level: High Comprehension Level: High Ability to understand written High instructions: Ability to understand verbal High instructions: Motivation Anxiety Level: Calm Cooperation: Cooperative Education Importance: Acknowledges Need Interest in Health Problems: Asks Questions Perception: Coherent Willingness to Engage in Self- High Management Activities: Readiness to Engage in Self- High Management Activities: Electronic Signature(s) Signed: 03/13/2019 4:22:05 PM By: Harold Barban Entered By: Harold Barban on 03/13/2019 15:44:41 [name] ([id number]) -------------------------------------------------------------------------------- Fall Risk Assessment Details Patient Name: [name] Date of Service: 03/13/2019 3:15 PM Medical Record Number: [id number] Patient Account Number: [id number] Date of Birth/Sex:  [birth year] (78 y.o. Male) Treating RN: Harold Barban Primary Care Morena Mckissack: Lucia Gaskins Other Clinician: Referring Laikyn Gewirtz: Lucia Gaskins Treating Juandiego Kolenovic/Extender: Tito Dine in Treatment: 0 Fall Risk Assessment Items Have you had 2 or more falls in the last 12 monthso 0 No Have you had any fall that resulted in injury in the last 12 monthso 0 No FALLS RISK SCREEN History of falling - immediate or within 3 months 0 No Secondary diagnosis (Do you have 2 or more medical diagnoseso) 0 No Ambulatory aid None/bed rest/wheelchair/nurse 0 No Crutches/cane/walker 15 Yes Furniture 0 No Intravenous therapy Access/Saline/Heparin Lock 0 No Gait/Transferring Normal/ bed rest/ wheelchair 0 No Weak (  short steps with or without shuffle, stooped but able to lift head while 0 No walking, may seek support from furniture) Impaired (short steps with shuffle, may have difficulty arising from chair, head 0 No down, impaired balance) Mental Status Oriented to own ability 0 Yes Electronic Signature(s) Signed: 03/13/2019 4:22:05 PM By: Harold Barban Entered By: Harold Barban on 03/13/2019 15:45:34 [name] ([id number]) -------------------------------------------------------------------------------- Foot Assessment Details Patient Name: [name] Date of Service: 03/13/2019 3:15 PM Medical Record Number: [id number] Patient Account Number: [id number] Date of Birth/Sex: [birth year] (78 y.o. Male) Treating RN: Harold Barban Primary Care Deone Omahoney: Lucia Gaskins Other Clinician: Referring Cerise Lieber: Lucia Gaskins Treating Aleynah Rocchio/Extender: Ricard Dillon Weeks in Treatment: 0 Foot Assessment Items Site Locations + = Sensation present, - = Sensation absent, C = Callus, U = Ulcer R = Redness, W = Warmth, M = Maceration, PU = Pre-ulcerative lesion F = Fissure, S = Swelling, D = Dryness Assessment Right: Left: Other Deformity: No No Prior Foot Ulcer:  No No Prior Amputation: No No Charcot Joint: No No Ambulatory Status: Non-ambulatory Assistance Device: Wheelchair Gait: Steady Notes Foot is cold to the touch. Blue and mottled. Left AKA Electronic Signature(s) Signed: 03/13/2019 4:22:05 PM By: Harold Barban Entered By: Harold Barban on 03/13/2019 15:58:24 [name] ([id number]) -------------------------------------------------------------------------------- Nutrition Risk Screening Details Patient Name: [name] Date of Service: 03/13/2019 3:15 PM Medical Record Number: [id number] Patient Account Number: [id number] Date of Birth/Sex: 12-28-1940 (78 y.o. Male) Treating RN: Harold Barban Primary Care Dempsy Damiano: Lucia Gaskins Other Clinician: Referring Caro Brundidge: Lucia Gaskins Treating Muriel Wilber/Extender: Ricard Dillon Weeks in Treatment: 0 Height (in): 75 Weight (lbs): 180 Body Mass Index (BMI): 22.5 Nutrition Risk Screening Items Score Screening NUTRITION RISK SCREEN: I have an illness or condition that made me change the kind and/or amount of 0 No food I eat I eat fewer than two meals per day 3 Yes I eat few fruits and vegetables, or milk products 2 Yes I have three or more drinks of beer, liquor or wine almost every day 0 No I have tooth or mouth problems that make it hard for me to eat 0 No I don't always have enough money to buy the food I need 0 No I eat alone most of the time 0 No I take three or more different prescribed or over-the-counter drugs a day 1 Yes Without wanting to, I have lost or gained 10 pounds in the last six months 0 No I am not always physically able to shop, cook and/or feed myself 0 No Nutrition Protocols Good Risk Protocol Moderate Risk Protocol High Risk Proctocol Risk Level: High Risk Score: 6 Electronic Signature(s) Signed: 03/13/2019 4:22:05 PM By: Harold Barban Entered By: Harold Barban on 03/13/2019 15:46:05

## 2019-03-14 NOTE — Progress Notes (Signed)
[name], [name] ([id number]) Visit Report for 03/13/2019 Allergy List Details Patient Name: [name], [name] Date of Service: 03/13/2019 3:15 PM Medical Record Number: [id number] Patient Account Number: [id number] Date of Birth/Sex: 01/26/1941 (77 y.o. Male) Treating RN: Harold Barban Primary Care Bindi Klomp: Lucia Gaskins Other Clinician: Referring Ashonte Angelucci: Lucia Gaskins Treating Baneen Wieseler/Extender: Ricard Dillon Weeks in Treatment: 0 Allergies Active Allergies erythromycin Reaction: anaphylaxis, swelling Severity: Severe lohexol Reaction: anaphylaxis, itching, hives Severity: Severe penicillin Reaction: anaphylaxis, itching Severity: Severe Allergy Notes Electronic Signature(s) Signed: 03/13/2019 4:22:05 PM By: Harold Barban Entered By: Harold Barban on 03/13/2019 15:31:08 [name] ([id number]) -------------------------------------------------------------------------------- Arrival Information Details Patient Name: [name] Date of Service: 03/13/2019 3:15 PM Medical Record Number: [id number] Patient Account Number: [id number] Date of Birth/Sex: [birth year] (78 y.o. Male) Treating RN: Harold Barban Primary Care Hae Ahlers: Lucia Gaskins Other Clinician: Referring Kaelob Persky: Lucia Gaskins Treating Ibraham Levi/Extender: Tito Dine in Treatment: 0 Visit Information Patient Arrived: Wheel Chair Arrival Time: 15:26 Accompanied By: self Transfer Assistance: None Patient Identification Verified: Yes Secondary Verification Process Completed: Yes Electronic Signature(s) Signed: 03/13/2019 4:22:05 PM By: Harold Barban Entered By: Harold Barban on 03/13/2019 15:26:45 [name] ([id number]) -------------------------------------------------------------------------------- Clinic Level of Care Assessment Details Patient Name: [name] Date of Service: 03/13/2019 3:15 PM Medical Record Number: [id number] Patient Account  Number: [id number] Date of Birth/Sex: 10/23/1940 (78 y.o. Male) Treating RN: Cornell Barman Primary Care Veniamin Kincaid: Lucia Gaskins Other Clinician: Referring Vernis Eid: Lucia Gaskins Treating Kacee Sukhu/Extender: Ricard Dillon Weeks in Treatment: 0 Clinic Level of Care Assessment Items TOOL 1 Quantity Score []  - Use when EandM and Procedure is performed on INITIAL visit 0 ASSESSMENTS - Nursing Assessment / Reassessment X - General Physical Exam (combine w/ comprehensive assessment (listed just below) when 1 20 performed on new pt. evals) X- 1 25 Comprehensive Assessment (HX, ROS, Risk Assessments, Wounds Hx, etc.) ASSESSMENTS - Wound and Skin Assessment / Reassessment []  - Dermatologic / Skin Assessment (not related to wound area) 0 ASSESSMENTS - Ostomy and/or Continence Assessment and Care []  - Incontinence Assessment and Management 0 []  - 0 Ostomy Care Assessment and Management (repouching, etc.) PROCESS - Coordination of Care X - Simple Patient / Family Education for ongoing care 1 15 []  - 0 Complex (extensive) Patient / Family Education for ongoing care X- 1 10 Staff obtains Programmer, systems, Records, Test Results / Process Orders []  - 0 Staff telephones HHA, Nursing Homes / Clarify orders / etc []  - 0 Routine Transfer to another Facility (non-emergent condition) []  - 0 Routine Hospital Admission (non-emergent condition) X- 1 15 New Admissions / Biomedical engineer / Ordering NPWT, Apligraf, etc. []  - 0 Emergency Hospital Admission (emergent condition) PROCESS - Special Needs []  - Pediatric / Minor Patient Management 0 []  - 0 Isolation Patient Management []  - 0 Hearing / Language / Visual special needs []  - 0 Assessment of Community assistance (transportation, D/C planning, etc.) []  - 0 Additional assistance / Altered mentation []  - 0 Support Surface(s) Assessment (bed, cushion, seat, etc.) [name], [name] V. ([id number]) INTERVENTIONS - Miscellaneous []  - External  ear exam 0 []  - 0 Patient Transfer (multiple staff / Civil Service fast streamer / Similar devices) []  - 0 Simple Staple / Suture removal (25 or less) []  - 0 Complex Staple / Suture removal (26 or more) []  - 0 Hypo/Hyperglycemic Management (do not check if billed separately) []  - 0 Ankle / Brachial Index (ABI) - do not check if billed separately Has the patient been seen at the hospital  within the last three years: Yes Total Score: 85 Level Of Care: New/Established - Level 3 Electronic Signature(s) Signed: 03/13/2019 5:54:22 PM By: Gretta Cool, BSN, RN, CWS, Kim RN, BSN Entered By: Gretta Cool, BSN, RN, CWS, Kim on 03/13/2019 16:33:51 [name] ([id number]) -------------------------------------------------------------------------------- Encounter Discharge Information Details Patient Name: [name] Date of Service: 03/13/2019 3:15 PM Medical Record Number: [id number] Patient Account Number: [id number] Date of Birth/Sex: 04/08/1941 (78 y.o. Male) Treating RN: Cornell Barman Primary Care Lyfe Reihl: Lucia Gaskins Other Clinician: Referring Hasan Douse: Lucia Gaskins Treating Alexxander Kurt/Extender: Tito Dine in Treatment: 0 Encounter Discharge Information Items Post Procedure Vitals Discharge Condition: Stable Unable to obtain vitals Reason: . Ambulatory Status: Wheelchair Discharge Destination: Home Transportation: Private Auto Accompanied By: self Schedule Follow-up Appointment: No Clinical Summary of Care: Electronic Signature(s) Signed: 03/13/2019 5:42:48 PM By: Gretta Cool, BSN, RN, CWS, Kim RN, BSN Entered By: Gretta Cool, BSN, RN, CWS, Kim on 03/13/2019 17:42:48 [name] ([id number]) -------------------------------------------------------------------------------- Lower Extremity Assessment Details Patient Name: [name] Date of Service: 03/13/2019 3:15 PM Medical Record Number: [id number] Patient Account Number: [id number] Date of Birth/Sex: 1941/06/19 (78 y.o. Male) Treating  RN: Harold Barban Primary Care Corrina Steffensen: Lucia Gaskins Other Clinician: Referring Corlette Ciano: Lucia Gaskins Treating Elener Custodio/Extender: Ricard Dillon Weeks in Treatment: 0 Edema Assessment Assessed: [Left: No] [Right: No] [Left: Edema] [Right: :] Calf Left: Right: Point of Measurement: 33 cm From Medial Instep cm 41.2 cm Ankle Left: Right: Point of Measurement: 11 cm From Medial Instep cm 25.5 cm Vascular Assessment Pulses: Dorsalis Pedis Palpable: [Right:Yes] Posterior Tibial Palpable: [Right:Yes] Electronic Signature(s) Signed: 03/13/2019 4:22:05 PM By: Harold Barban Entered By: Harold Barban on 03/13/2019 16:05:37 [name] ([id number]) -------------------------------------------------------------------------------- Multi Wound Chart Details Patient Name: [name] Date of Service: 03/13/2019 3:15 PM Medical Record Number: [id number] Patient Account Number: [id number] Date of Birth/Sex: 07/02/1941 (78 y.o. Male) Treating RN: Cornell Barman Primary Care Xabi Wittler: Lucia Gaskins Other Clinician: Referring Aviya Jarvie: Lucia Gaskins Treating Kili Gracy/Extender: Ricard Dillon Weeks in Treatment: 0 Vital Signs Height(in): 75 Pulse(bpm): 116 Weight(lbs): 180 Blood Pressure(mmHg): 128/82 Body Mass Index(BMI): 22 Temperature(F): 99.3 Respiratory Rate 18 (breaths/min): Photos: [N/A:N/A] Wound Location: Sacrum - Midline N/A N/A Wounding Event: Gradually Appeared N/A N/A Primary Etiology: Pressure Ulcer N/A N/A Comorbid History: Glaucoma, Asthma, Chronic N/A N/A Obstructive Pulmonary Disease (COPD), Coronary Artery Disease, Type II Diabetes, Neuropathy Date Acquired: 02/11/2019 N/A N/A Weeks of Treatment: 0 N/A N/A Wound Status: Open N/A N/A Clustered Wound: Yes N/A N/A Measurements L x W x D 1.5x3x0.1 N/A N/A (cm) Area (cm) : 3.534 N/A N/A Volume (cm) : 0.353 N/A N/A Classification: Category/Stage II N/A N/A Exudate Amount:  Medium N/A N/A Exudate Type: Serosanguineous N/A N/A Exudate Color: red, brown N/A N/A Wound Margin: Flat and Intact N/A N/A Granulation Amount: None Present (0%) N/A N/A Necrotic Amount: Large (67-100%) N/A N/A Exposed Structures: Fat Layer (Subcutaneous N/A N/A Tissue) Exposed: Yes Fascia: No Tendon: No Muscle: No [name], [name] V. ([id number]) Joint: No Bone: No Epithelialization: None N/A N/A Debridement: Debridement - Excisional N/A N/A Pre-procedure 16:25 N/A N/A Verification/Time Out Taken: Pain Control: Lidocaine N/A N/A Tissue Debrided: Subcutaneous, Slough N/A N/A Level: Skin/Subcutaneous Tissue N/A N/A Debridement Area (sq cm): 1 N/A N/A Instrument: Curette N/A N/A Bleeding: Minimum N/A N/A Hemostasis Achieved: Pressure N/A N/A Debridement Treatment Procedure was tolerated well N/A N/A Response: Post Debridement 1.5x3x0.2 N/A N/A Measurements L x W x D (cm) Post Debridement Volume: 0.707 N/A N/A (cm) Post Debridement Stage: Category/Stage II N/A  N/A Procedures Performed: Debridement N/A N/A Treatment Notes Electronic Signature(s) Signed: 03/13/2019 5:45:20 PM By: Linton Ham MD Entered By: Linton Ham on 03/13/2019 17:02:46 [name] ([id number]) -------------------------------------------------------------------------------- Multi-Disciplinary Care Plan Details Patient Name: [name] Date of Service: 03/13/2019 3:15 PM Medical Record Number: [id number] Patient Account Number: [id number] Date of Birth/Sex: [birth year] (78 y.o. Male) Treating RN: Cornell Barman Primary Care Steen Bisig: Lucia Gaskins Other Clinician: Referring Burel Kahre: Lucia Gaskins Treating Kathyleen Radice/Extender: Tito Dine in Treatment: 0 Active Inactive Orientation to the Wound Care Program Nursing Diagnoses: Knowledge deficit related to the wound healing center program Goals: Patient/caregiver will verbalize understanding of the [place]  Program Date Initiated: 03/13/2019 Target Resolution Date: 03/20/2019 Goal Status: Active Interventions: Provide education on orientation to the wound center Notes: Pain, Acute or Chronic Nursing Diagnoses: Pain, acute or chronic: actual or potential Goals: Patient will verbalize adequate pain control and receive pain control interventions during procedures as needed Date Initiated: 03/13/2019 Target Resolution Date: 03/20/2019 Goal Status: Active Interventions: Assess comfort goal upon admission Notes: Pressure Nursing Diagnoses: Knowledge deficit related to management of pressures ulcers Goals: Patient will remain free from development of additional pressure ulcers Date Initiated: 03/13/2019 Target Resolution Date: 03/27/2019 Goal Status: Active Interventions: Assess: immobility, friction, shearing, incontinence upon admission and as needed [name], [name] ([id number]) Provide education on pressure ulcers Notes: Wound/Skin Impairment Nursing Diagnoses: Impaired tissue integrity Goals: Ulcer/skin breakdown will have a volume reduction of 30% by week 4 Date Initiated: 03/13/2019 Target Resolution Date: 04/10/2019 Goal Status: Active Interventions: Assess ulceration(s) every visit Provide education on smoking Treatment Activities: Skin care regimen initiated : 03/13/2019 Notes: Electronic Signature(s) Signed: 03/13/2019 5:54:22 PM By: Gretta Cool, BSN, RN, CWS, Kim RN, BSN Entered By: Gretta Cool, BSN, RN, CWS, Kim on 03/13/2019 16:24:25 [name] ([id number]) -------------------------------------------------------------------------------- Pain Assessment Details Patient Name: [name] Date of Service: 03/13/2019 3:15 PM Medical Record Number: [id number] Patient Account Number: [id number] Date of Birth/Sex: December 26, 1940 (78 y.o. Male) Treating RN: Harold Barban Primary Care Shammara Jarrett: Lucia Gaskins Other Clinician: Referring Sita Mangen: Lucia Gaskins Treating  Cova Knieriem/Extender: Ricard Dillon Weeks in Treatment: 0 Active Problems Location of Pain Severity and Description of Pain Patient Has Paino Yes Site Locations Pain Location: Pain in Ulcers Duration of the Pain. Constant / Intermittento Constant Rate the pain. Current Pain Level: 9 Character of Pain Describe the Pain: Burning Pain Management and Medication Current Pain Management: Electronic Signature(s) Signed: 03/13/2019 4:22:05 PM By: Harold Barban Entered By: Harold Barban on 03/13/2019 15:27:25 [name] ([id number]) -------------------------------------------------------------------------------- Patient/Caregiver Education Details Patient Name: [name] Date of Service: 03/13/2019 3:15 PM Medical Record Number: [id number] Patient Account Number: [id number] Date of Birth/Gender: Feb 15, 1941 (78 y.o. Male) Treating RN: Cornell Barman Primary Care Physician: Lucia Gaskins Other Clinician: Referring Physician: Lucia Gaskins Treating Physician/Extender: Tito Dine in Treatment: 0 Education Assessment Education Provided To: Patient and Caregiver Education Topics Provided Pressure: Handouts: Pressure Ulcers: Care and Offloading Methods: Demonstration, Explain/Verbal Responses: State content correctly Smoking and Wound Healing: Handouts: Smoking and Wound Healing Methods: Demonstration, Explain/Verbal Responses: State content correctly Welcome [facility]: Handouts: Welcome [facility] Methods: Demonstration, Explain/Verbal Responses: State content correctly Electronic Signature(s) Signed: 03/13/2019 5:54:22 PM By: Gretta Cool, BSN, RN, CWS, Kim RN, BSN Entered By: Gretta Cool, BSN, RN, CWS, Kim on 03/13/2019 16:34:42 [name] ([id number]) -------------------------------------------------------------------------------- Wound Assessment Details Patient Name: [name] Date of Service: 03/13/2019 3:15  PM Medical Record Number: [id number] Patient Account  Number: [id number] Date of Birth/Sex: 03/06/1941 (78 y.o. Male) Treating RN: Harold Barban Primary Care Breniyah Romm: Lucia Gaskins Other Clinician: Referring Sophee Mckimmy: Lucia Gaskins Treating Maryelizabeth Eberle/Extender: Ricard Dillon Weeks in Treatment: 0 Wound Status Wound Number: 1 Primary Pressure Ulcer Etiology: Wound Location: Sacrum - Midline Wound Open Wounding Event: Gradually Appeared Status: Date Acquired: 02/11/2019 Comorbid Glaucoma, Asthma, Chronic Obstructive Weeks Of Treatment: 0 History: Pulmonary Disease (COPD), Coronary Artery Clustered Wound: Yes Disease, Type II Diabetes, Neuropathy Photos Wound Measurements Length: (cm) 1.5 Width: (cm) 3 Depth: (cm) 0.1 Area: (cm) 3.534 Volume: (cm) 0.353 % Reduction in Area: % Reduction in Volume: Epithelialization: None Tunneling: No Undermining: No Wound Description Classification: Category/Stage II Foul Odor Aft Wound Margin: Flat and Intact Slough/Fibrin Exudate Amount: Medium Exudate Type: Serosanguineous Exudate Color: red, brown er Cleansing: No o Yes Wound Bed Granulation Amount: None Present (0%) Exposed Structure Necrotic Amount: Large (67-100%) Fascia Exposed: No Necrotic Quality: Adherent Slough Fat Layer (Subcutaneous Tissue) Exposed: Yes Tendon Exposed: No Muscle Exposed: No Joint Exposed: No Bone Exposed: No Treatment Notes [name], [name] V. ([id number]) Wound #1 (Midline Sacrum) Notes prisma, hydrogel, bordered foam dressing Electronic Signature(s) Signed: 03/13/2019 4:22:05 PM By: Harold Barban Entered By: Harold Barban on 03/13/2019 15:54:37 [name] ([id number]) -------------------------------------------------------------------------------- Vitals Details Patient Name: [name] Date of Service: 03/13/2019 3:15 PM Medical Record Number: [id number] Patient Account Number: [id number] Date of Birth/Sex: 1941-04-11  (78 y.o. Male) Treating RN: Harold Barban Primary Care Jaydyn Bozzo: Lucia Gaskins Other Clinician: Referring Sharone Picchi: Lucia Gaskins Treating [facility]/Extender: Ricard Dillon Weeks in Treatment: 0 Vital Signs Time Taken: 15:25 Temperature (F): 99.3 Height (in): 75 Pulse (bpm): 116 Source: Stated Respiratory Rate (breaths/min): 18 Weight (lbs): 180 Blood Pressure (mmHg): 128/82 Source: Stated Reference Range: 80 - 120 mg / dl Body Mass Index (BMI): 22.5 Electronic Signature(s) Signed: 03/13/2019 4:22:05 PM By: Harold Barban Entered By: Harold Barban on 03/13/2019 15:28:30

## 2019-03-14 NOTE — Progress Notes (Signed)
[name], [name] ([id number]) Visit Report for 03/13/2019 Chief Complaint Document Details Patient Name: [name], [name] Date of Service: 03/13/2019 3:15 PM Medical Record Number: [id number] Patient Account Number: [id number] Date of Birth/Sex: October 30, 1940 (77 y.o. Male) Treating RN: Cornell Barman Primary Care Provider: Lucia Gaskins Other Clinician: Referring Provider: Lucia Gaskins Treating Provider/Extender: Ricard Dillon Weeks in Treatment: 0 Information Obtained from: Patient Chief Complaint 03/13/2019; patient is here for review of a collection of small wounds in the lower sacrum/coccyx Electronic Signature(s) Signed: 03/13/2019 5:45:20 PM By: Linton Ham MD Entered By: Linton Ham on 03/13/2019 17:04:15 [name] ([id number]) -------------------------------------------------------------------------------- Debridement Details Patient Name: [name] Date of Service: 03/13/2019 3:15 PM Medical Record Number: [id number] Patient Account Number: [id number] Date of Birth/Sex: 1941/03/08 (77 y.o. Male) Treating RN: Cornell Barman Primary Care Provider: Lucia Gaskins Other Clinician: Referring Provider: Lucia Gaskins Treating Provider/Extender: Tito Dine in Treatment: 0 Debridement Performed for Wound #1 Midline Sacrum Assessment: Performed By: Physician Ricard Dillon, MD Debridement Type: Debridement Level of Consciousness (Pre- Awake and Alert procedure): Pre-procedure Verification/Time Yes - 16:25 Out Taken: Start Time: 16:25 Pain Control: Lidocaine Total Area Debrided (L x W): 1 (cm) x 1 (cm) = 1 (cm) Tissue and other material Viable, Non-Viable, Slough, Subcutaneous, Skin: Dermis , Slough debrided: Level: Skin/Subcutaneous Tissue Debridement Description: Excisional Instrument: Curette Bleeding: Minimum Hemostasis Achieved: Pressure End Time: 16:29 Response to Treatment: Procedure was tolerated well Level of  Consciousness Awake and Alert (Post-procedure): Post Debridement Measurements of Total Wound Length: (cm) 1.5 Stage: Category/Stage II Width: (cm) 3 Depth: (cm) 0.2 Volume: (cm) 0.707 Character of Wound/Ulcer Post Stable Debridement: Post Procedure Diagnosis Same as Pre-procedure Electronic Signature(s) Signed: 03/13/2019 5:45:20 PM By: Linton Ham MD Signed: 03/13/2019 5:54:22 PM By: Gretta Cool, BSN, RN, CWS, Kim RN, BSN Entered By: Gretta Cool, BSN, RN, CWS, Kim on 03/13/2019 16:26:13 [name] ([id number]) -------------------------------------------------------------------------------- HPI Details Patient Name: [name] Date of Service: 03/13/2019 3:15 PM Medical Record Number: [id number] Patient Account Number: [id number] Date of Birth/Sex: 04-May-1941 (78 y.o. Male) Treating RN: Cornell Barman Primary Care Provider: Lucia Gaskins Other Clinician: Referring Provider: Lucia Gaskins Treating Provider/Extender: Ricard Dillon Weeks in Treatment: 0 History of Present Illness HPI Description: ADMISSION 03/13/2019 Patient is a 78 year old man with type 2 diabetes and a prior history of a left above-knee amputation. Over the last month he developed a painful abruption in his lower sacrum. His wife describes this is a dark discoloration that more recently is opened into small circular wounds. The patient finds the entire area very uncomfortable. They were using Lantiseptic skin protectant but more recently bacitracin. No real improvement. The patient was seen in urgent care on 03/08/2019 given doxycycline and an antifungal cream. It is notable that he has a history of a pilonidal cyst with pilonidal cyst surgery apparently 60 years ago. He has not had any problems with this since.The patient is mostly in his wheelchair and/or recliner Past medical history; type 2 diabetes on oral agents, COPD with continued smoking, history of peripheral vascular disease. 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 Electronic Signature(s) Signed: 03/13/2019 5:45:20 PM By: Linton Ham MD Entered By: Linton Ham on 03/13/2019 17:14:35 [name] ([id number]) -------------------------------------------------------------------------------- Physical Exam Details Patient Name: [name] Date of Service: 03/13/2019 3:15 PM Medical Record Number: [id number]  Patient Account Number: [id number] Date of Birth/Sex: Dec 27, 1940 (78 y.o. Male) Treating RN: Cornell Barman Primary Care Provider: Lucia Gaskins Other Clinician: Referring Provider: Lucia Gaskins Treating Provider/Extender: Ricard Dillon Weeks in Treatment: 0 Constitutional Sitting or standing Blood Pressure is within target range for patient.. Pulse regular and within target range for patient.Marland Kitchen Respirations regular, non-labored and within target range.. Temperature is normal and within the target range for the patient.Marland Kitchen appears in no distress. Eyes Conjunctivae clear. No discharge. Respiratory Respiratory effort is easy and symmetric bilaterally. Rate is normal at rest and on room air.. Cardiovascular 3+ pitting edema in the right leg to the knee. Brawny discoloration of the foot however his peripheral pulses are palpable. Gastrointestinal (GI) No masses. No liver or spleen enlargement or tenderness.. Genitourinary (GU) No bladder distention. Lymphatic None palpable in the popliteal area or the inguinal area. Integumentary (Hair, Skin) No systemic skin issue is seen. Psychiatric No evidence of depression, anxiety, or agitation. Calm, cooperative, and communicative. Appropriate interactions and affect.. Notes Wound exam; the patient has a cluster of 7 small circular wounds mostly in the lower sacrum/coccyx area at the top of the gluteal cleft. Some of them  seem to extend into the right buttock. All of them covered in adherent fibrinous debris. There is no blisters no subcutaneous extension. There is no evidence of surrounding cellulitis at least currently. The scar from his long ago pilonidal surgery is just superior to this area. There is no sinus tracts no purulence Electronic Signature(s) Signed: 03/13/2019 5:45:20 PM By: Linton Ham MD Entered By: Linton Ham on 03/13/2019 17:13:56 [name] ([id number]) -------------------------------------------------------------------------------- Physician Orders Details Patient Name: [name] Date of Service: 03/13/2019 3:15 PM Medical Record Number: [id number] Patient Account Number: [id number] Date of Birth/Sex: 1941-01-16 (77 y.o. Male) Treating RN: Cornell Barman Primary Care Provider: Lucia Gaskins Other Clinician: Referring Provider: Lucia Gaskins Treating Provider/Extender: Tito Dine in Treatment: 0 Verbal / Phone Orders: No Diagnosis Coding Wound Cleansing Wound #1 Midline Sacrum o Cleanse wound with mild soap and water Anesthetic (add to Medication List) Wound #1 Midline Sacrum o Topical Lidocaine 4% cream applied to wound bed prior to debridement (In Clinic Only). o Benzocaine Topical Anesthetic Spray applied to wound bed prior to debridement (In Clinic Only). Primary Wound Dressing Wound #1 Midline Sacrum o Silver Collagen - moisten with hydrogel Secondary Dressing Wound #1 Midline Sacrum o Boardered Foam Dressing Dressing Change Frequency Wound #1 Midline Sacrum o Change Dressing Monday, Wednesday, Friday Follow-up Appointments Wound #1 Midline Sacrum o Return Appointment in 1 week. Off-Loading Wound #1 Midline Sacrum o Roho cushion for wheelchair o Turn and reposition every 2 hours Electronic Signature(s) Signed: 03/13/2019 5:45:20 PM By: Linton Ham MD Signed: 03/13/2019 5:54:22 PM By: Gretta Cool, BSN, RN, CWS, Kim RN,  BSN Entered By: Gretta Cool, BSN, RN, CWS, Kim on 03/13/2019 16:31:40 [name] ([id number]) -------------------------------------------------------------------------------- Problem List Details Patient Name: [name] Date of Service: 03/13/2019 3:15 PM Medical Record Number: [id number] Patient Account Number: [id number] Date of Birth/Sex: February 09, 1941 (78 y.o. Male) Treating RN: Cornell Barman Primary Care Provider: Lucia Gaskins Other Clinician: Referring Provider: Lucia Gaskins Treating Provider/Extender: Ricard Dillon Weeks in Treatment: 0 Active Problems ICD-10 Evaluated Encounter Code Description Active Date Today Diagnosis L05.92 Pilonidal sinus without abscess 03/13/2019 No Yes L89.152 Pressure ulcer of sacral region, stage 2 03/13/2019 No Yes Inactive Problems Resolved Problems Electronic Signature(s) Signed: 03/13/2019 5:45:20 PM By: Linton Ham MD Entered By: Linton Ham on 03/13/2019 17:19:34 [name],  [name] ([id number]) -------------------------------------------------------------------------------- Progress Note Details Patient Name: [name], [name] Date of Service: 03/13/2019 3:15 PM Medical Record Number: [id number] Patient Account Number: [id number] Date of Birth/Sex: 10-31-1940 (78 y.o. Male) Treating RN: Cornell Barman Primary Care Provider: Lucia Gaskins Other Clinician: Referring Provider: Lucia Gaskins Treating Provider/Extender: Ricard Dillon Weeks in Treatment: 0 Subjective Chief Complaint Information obtained from Patient 03/13/2019; patient is here for review of a collection of small wounds in the lower sacrum/coccyx History of Present Illness (HPI) ADMISSION 03/13/2019 Patient is a 78 year old man with type 2 diabetes and a prior history of a left above-knee amputation. Over the last month he developed a painful abruption in his lower sacrum. His wife describes this is a dark discoloration that more recently is opened into  small circular wounds. The patient finds the entire area very uncomfortable. They were using Lantiseptic skin protectant but more recently bacitracin. No real improvement. The patient was seen in urgent care on 03/08/2019 given doxycycline and an antifungal cream. It is notable that he has a history of a pilonidal cyst with pilonidal cyst surgery apparently 60 years ago. He has not had any problems with this since.The patient is mostly in his wheelchair and/or recliner Past medical history; type 2 diabetes on oral agents, COPD with continued smoking, history of peripheral vascular disease. 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 Patient History Information obtained from Patient. Allergies erythromycin (Severity: Severe, Reaction: anaphylaxis, swelling), lohexol (Severity: Severe, Reaction: anaphylaxis, itching, hives), penicillin (Severity: Severe, Reaction: anaphylaxis, itching) Family History Cancer - Father, Diabetes - Mother,Father,Siblings, Hypertension - Father,Mother,Maternal Grandparents, Stroke - Maternal Grandparents, No family history of Heart Disease, Hereditary Spherocytosis, Kidney Disease, Lung Disease, Seizures, Thyroid Problems, Tuberculosis. Social History Current every day smoker - 60 years, Marital Status - Married, Alcohol Use - Never, Drug Use - No History, Caffeine Use - Daily - coffee. Medical History Eyes Patient has history of Glaucoma Denies history of Cataracts, Optic Neuritis Ear/Nose/Mouth/Throat Denies history of Chronic sinus problems/congestion Hematologic/Lymphatic Denies history of Anemia, Hemophilia, Human Immunodeficiency Virus, Lymphedema, Sickle Cell Disease [name], [name] V. ([id number]) Respiratory Patient has history of Asthma, Chronic Obstructive Pulmonary Disease (COPD) Cardiovascular Patient has  history of Coronary Artery Disease Gastrointestinal Denies history of Cirrhosis , Colitis, Crohn s, Hepatitis A, Hepatitis B, Hepatitis C Endocrine Patient has history of Type II Diabetes Denies history of Type I Diabetes Genitourinary Denies history of End Stage Renal Disease Immunological Denies history of Lupus Erythematosus, Raynaud s, Scleroderma Integumentary (Skin) Denies history of History of Burn, History of pressure wounds Neurologic Patient has history of Neuropathy - hands Denies history of Quadriplegia, Paraplegia, Seizure Disorder Oncologic Denies history of Received Chemotherapy, Received Radiation Psychiatric Denies history of Anorexia/bulimia, Confinement Anxiety Patient is treated with Insulin. Blood sugar is not tested. Medical And Surgical History Notes Eyes laser surgery Cardiovascular Bypass 2009-2010 rheumatic fever as child Review of Systems (ROS) Constitutional Symptoms (General Health) Denies complaints or symptoms of Fatigue, Fever, Chills, Marked Weight Change. Eyes Complains or has symptoms of Dry Eyes - left. Denies complaints or symptoms of Vision Changes, Glasses / Contacts. Ear/Nose/Mouth/Throat Denies complaints or symptoms of Difficult clearing ears, Sinusitis. Hematologic/Lymphatic Denies complaints or symptoms of Bleeding / Clotting Disorders, Human Immunodeficiency Virus. Respiratory Complains or has symptoms of Shortness of Breath. Denies complaints or symptoms of Chronic or frequent coughs. Cardiovascular  Complains or has symptoms of LE edema - Right leg. Denies complaints or symptoms of Chest pain. Gastrointestinal Denies complaints or symptoms of Frequent diarrhea, Nausea, Vomiting. Endocrine Denies complaints or symptoms of Hepatitis, Thyroid disease, Polydypsia (Excessive Thirst). Genitourinary Left kidney removed with ureter (due to cancer in ureter) 2003 Immunological Denies complaints or symptoms of Hives,  Itching. Integumentary (Skin) Complains or has symptoms of Wounds - sacrum, Swelling - right leg. Denies complaints or symptoms of Bleeding or bruising tendency. Neurologic Complains or has symptoms of Numbness/parasthesias - hands. [name], [name] ([id number]) Denies complaints or symptoms of Focal/Weakness. Oncologic Uerter cancer resolved by removal Psychiatric Denies complaints or symptoms of Anxiety, Claustrophobia. Objective Constitutional Sitting or standing Blood Pressure is within target range for patient.. Pulse regular and within target range for patient.Marland Kitchen Respirations regular, non-labored and within target range.. Temperature is normal and within the target range for the patient.Marland Kitchen appears in no distress. Vitals Time Taken: 3:25 PM, Height: 75 in, Source: Stated, Weight: 180 lbs, Source: Stated, BMI: 22.5, Temperature: 99.3 F, Pulse: 116 bpm, Respiratory Rate: 18 breaths/min, Blood Pressure: 128/82 mmHg. Eyes Conjunctivae clear. No discharge. Respiratory Respiratory effort is easy and symmetric bilaterally. Rate is normal at rest and on room air.. Cardiovascular 3+ pitting edema in the right leg to the knee. Brawny discoloration of the foot however his peripheral pulses are palpable. Gastrointestinal (GI) No masses. No liver or spleen enlargement or tenderness.. Genitourinary (GU) No bladder distention. Lymphatic None palpable in the popliteal area or the inguinal area. Psychiatric No evidence of depression, anxiety, or agitation. Calm, cooperative, and communicative. Appropriate interactions and affect.. General Notes: Wound exam; the patient has a cluster of 7 small circular wounds mostly in the lower sacrum/coccyx area at the top of the gluteal cleft. Some of them seem to extend into the right buttock. All of them covered in adherent fibrinous debris. There is no blisters no subcutaneous extension. There is no evidence of surrounding cellulitis at least currently.  The scar from his long ago pilonidal surgery is just superior to this area. There is no sinus tracts no purulence Integumentary (Hair, Skin) No systemic skin issue is seen. Wound #1 status is Open. Original cause of wound was Gradually Appeared. The wound is located on the Midline Sacrum. The wound measures 1.5cm length x 3cm width x 0.1cm depth; 3.534cm^2 area and 0.353cm^3 volume. There is Fat Layer (Subcutaneous Tissue) Exposed exposed. There is no tunneling or undermining noted. There is a medium amount of serosanguineous drainage noted. The wound margin is flat and intact. There is no granulation within the wound bed. There is [name], [name]. ([id number]) a large (67-100%) amount of necrotic tissue within the wound bed including Adherent Slough. Assessment Active Problems ICD-10 Pilonidal sinus without abscess Pressure ulcer of sacral region, stage 2 Procedures Wound #1 Pre-procedure diagnosis of Wound #1 is a Pressure Ulcer located on the Midline Sacrum . There was a Excisional Skin/Subcutaneous Tissue Debridement with a total area of 1 sq cm performed by Ricard Dillon, MD. With the following instrument(s): Curette to remove Viable and Non-Viable tissue/material. Material removed includes Subcutaneous Tissue, Slough, and Skin: Dermis after achieving pain control using Lidocaine. No specimens were taken. A time out was conducted at 16:25, prior to the start of the procedure. A Minimum amount of bleeding was controlled with Pressure. The procedure was tolerated well. Post Debridement Measurements: 1.5cm length x 3cm width x 0.2cm depth; 0.707cm^3 volume. Post debridement Stage noted as Category/Stage II. Character of Wound/Ulcer  Post Debridement is stable. Post procedure Diagnosis Wound #1: Same as Pre-Procedure Plan Wound Cleansing: Wound #1 Midline Sacrum: Cleanse wound with mild soap and water Anesthetic (add to Medication List): Wound #1 Midline Sacrum: Topical Lidocaine  4% cream applied to wound bed prior to debridement (In Clinic Only). Benzocaine Topical Anesthetic Spray applied to wound bed prior to debridement (In Clinic Only). Primary Wound Dressing: Wound #1 Midline Sacrum: Silver Collagen - moisten with hydrogel Secondary Dressing: Wound #1 Midline Sacrum: Boardered Foam Dressing Dressing Change Frequency: Wound #1 Midline Sacrum: Change Dressing Monday, Wednesday, Friday Follow-up Appointments: Wound #1 Midline Sacrum: Return Appointment in 1 week. Off-LoadingHALEEM, [name] ([id number]) Wound #1 Midline Sacrum: Roho cushion for wheelchair Turn and reposition every 2 hours 1. Very odd looking collection of wounds. Using a #3 curette I attempted to debride these as much as the patient could tolerate. The wounds are not much bigger than the head of the curette itself. Hemostasis with direct pressure 2. This did not look infected 3. I wondered about originally this being zoster or hidradenitis however I cannot be certain about this. It certainly might account for some of the reason why this is so painful 4. We use silver collagen and bordered border foam that will be changed daily. 5. I did not see any additional reasons for antibiotics he is apparently finishing Keflex although what was given to him at the urgent care at least according to their notes was doxycycline 6. The patient [place] near [place] actually [place] near [place] I will bring him back in 2 weeks. Electronic Signature(s) Signed: 03/13/2019 5:20:13 PM By: Linton Ham MD Entered By: Linton Ham on 03/13/2019 17:20:13 [name] ([id number]) -------------------------------------------------------------------------------- ROS/PFSH Details Patient Name: [name] Date of Service: 03/13/2019 3:15 PM Medical Record Number: [id number] Patient Account Number: [id number] Date of Birth/Sex: Nov 22, 1940 (78 y.o. Male) Treating RN: Harold Barban Primary Care Provider: Lucia Gaskins Other Clinician: Referring Provider: Lucia Gaskins Treating Provider/Extender: Tito Dine in Treatment: 0 Information Obtained From Patient Constitutional Symptoms (General Health) Complaints and Symptoms: Negative for: Fatigue; Fever; Chills; Marked Weight Change Eyes Complaints and Symptoms: Positive for: Dry Eyes - left Negative for: Vision Changes; Glasses / Contacts Medical History: Positive for: Glaucoma Negative for: Cataracts; Optic Neuritis Past Medical History Notes: laser surgery Ear/Nose/Mouth/Throat Complaints and Symptoms: Negative for: Difficult clearing ears; Sinusitis Medical History: Negative for: Chronic sinus problems/congestion Hematologic/Lymphatic Complaints and Symptoms: Negative for: Bleeding / Clotting Disorders; Human Immunodeficiency Virus Medical History: Negative for: Anemia; Hemophilia; Human Immunodeficiency Virus; Lymphedema; Sickle Cell Disease Respiratory Complaints and Symptoms: Positive for: Shortness of Breath Negative for: Chronic or frequent coughs Medical History: Positive for: Asthma; Chronic Obstructive Pulmonary Disease (COPD) Cardiovascular Complaints and Symptoms: Positive for: LE edema - Right leg Negative for: Chest pain [name], [name] ([id number]) Medical History: Positive for: Coronary Artery Disease Past Medical History Notes: Bypass 2009-2010 rheumatic fever as child Gastrointestinal Complaints and Symptoms: Negative for: Frequent diarrhea; Nausea; Vomiting Medical History: Negative for: Cirrhosis ; Colitis; Crohnos; Hepatitis A; Hepatitis B; Hepatitis C Endocrine Complaints and Symptoms: Negative for: Hepatitis; Thyroid disease; Polydypsia (Excessive Thirst) Medical History: Positive for: Type II Diabetes Negative for: Type I Diabetes Time with diabetes: 8 years Treated with: Insulin Blood sugar tested every day:  No Immunological Complaints and Symptoms: Negative for: Hives; Itching Medical History: Negative for: Lupus Erythematosus; Raynaudos; Scleroderma Integumentary (Skin) Complaints and Symptoms: Positive for: Wounds - sacrum; Swelling - right leg Negative for: Bleeding or  bruising tendency Medical History: Negative for: History of Burn; History of pressure wounds Neurologic Complaints and Symptoms: Positive for: Numbness/parasthesias - hands Negative for: Focal/Weakness Medical History: Positive for: Neuropathy - hands Negative for: Quadriplegia; Paraplegia; Seizure Disorder Psychiatric Complaints and Symptoms: Negative for: Anxiety; Claustrophobia Medical HistoryAYDENN, [name] ([id number]) Negative for: Anorexia/bulimia; Confinement Anxiety Genitourinary Complaints and Symptoms: Review of System Notes: Left kidney removed with ureter (due to cancer in ureter) 2003 Medical History: Negative for: End Stage Renal Disease Oncologic Complaints and Symptoms: Review of System Notes: Uerter cancer resolved by removal Medical History: Negative for: Received Chemotherapy; Received Radiation HBO Extended History Items Eyes: Glaucoma Immunizations Pneumococcal Vaccine: Received Pneumococcal Vaccination: No Implantable Devices None Family and Social History Cancer: Yes - Father; Diabetes: Yes - Mother,Father,Siblings; Heart Disease: No; Hereditary Spherocytosis: No; Hypertension: Yes - Father,Mother,Maternal Grandparents; Kidney Disease: No; Lung Disease: No; Seizures: No; Stroke: Yes - Maternal Grandparents; Thyroid Problems: No; Tuberculosis: No; Current every day smoker - 60 years; Marital Status - Married; Alcohol Use: Never; Drug Use: No History; Caffeine Use: Daily - coffee; Financial Concerns: No; Food, Clothing or Shelter Needs: No; Support System Lacking: No; Transportation Concerns: No Electronic Signature(s) Signed: 03/13/2019 4:22:05 PM By: Harold Barban Signed:  03/13/2019 5:45:20 PM By: Linton Ham MD Entered By: Harold Barban on 03/13/2019 15:43:52 [name] ([id number]) -------------------------------------------------------------------------------- [place] Details Patient Name: [name] Date of Service: 03/13/2019 Medical Record Number: [id number] Patient Account Number: [id number] Date of Birth/Sex: 08-27-1941 (78 y.o. Male) Treating RN: Cornell Barman Primary Care Provider: Lucia Gaskins Other Clinician: Referring Provider: Lucia Gaskins Treating Provider/Extender: Ricard Dillon Weeks in Treatment: 0 Diagnosis Coding ICD-10 Codes Code Description L05.92 Pilonidal sinus without abscess L89.152 Pressure ulcer of sacral region, stage 2 Facility Procedures CPT4 Code: [id number] Description: [place] VISIT-LEV 3 EST PT Modifier: Quantity: 1 CPT4 Code: [id number] Description: 11042 - DEB SUBQ TISSUE 20 SQ CM/< ICD-10 Diagnosis Description L89.152 Pressure ulcer of sacral region, stage 2 Modifier: Quantity: 1 Physician Procedures CPT4 Code: [id number] Description: WC PHYS LEVEL 3 o NEW PT ICD-10 Diagnosis Description L89.152 Pressure ulcer of sacral region, stage 2 L05.92 Pilonidal sinus without abscess Modifier: 25 Quantity: 1 CPT4 Code: [id number] Description: 11042 - WC PHYS SUBQ TISS 20 SQ CM ICD-10 Diagnosis Description L89.152 Pressure ulcer of sacral region, stage 2 Modifier: Quantity: 1 Electronic Signature(s) Signed: 03/13/2019 5:45:20 PM By: Linton Ham MD Entered By: Linton Ham on 03/13/2019 17:18:48

## 2019-03-18 ENCOUNTER — Telehealth: Payer: Self-pay | Admitting: *Deleted

## 2019-03-18 NOTE — Telephone Encounter (Signed)
Call from patient c/o increase swelling in leg. Last visit here with Vinnie Level on 02/20/2019. And was instructed to f/u in a year after clinical findings. He is seen by wound care doctor, but has not seen PCP. Instructed to see PCP for evaluation ASAP and have them call this office if vascular services needed.

## 2019-03-19 DIAGNOSIS — D510 Vitamin B12 deficiency anemia due to intrinsic factor deficiency: Secondary | ICD-10-CM | POA: Diagnosis not present

## 2019-03-19 DIAGNOSIS — E7849 Other hyperlipidemia: Secondary | ICD-10-CM | POA: Diagnosis not present

## 2019-03-19 DIAGNOSIS — I110 Hypertensive heart disease with heart failure: Secondary | ICD-10-CM | POA: Diagnosis not present

## 2019-03-19 DIAGNOSIS — E559 Vitamin D deficiency, unspecified: Secondary | ICD-10-CM | POA: Diagnosis not present

## 2019-03-19 DIAGNOSIS — R5383 Other fatigue: Secondary | ICD-10-CM | POA: Diagnosis not present

## 2019-03-19 DIAGNOSIS — E1165 Type 2 diabetes mellitus with hyperglycemia: Secondary | ICD-10-CM | POA: Diagnosis not present

## 2019-03-21 ENCOUNTER — Ambulatory Visit: Payer: Medicare HMO | Admitting: Physician Assistant

## 2019-03-25 ENCOUNTER — Encounter: Payer: Medicare HMO | Admitting: Physician Assistant

## 2019-03-25 ENCOUNTER — Other Ambulatory Visit: Payer: Self-pay

## 2019-03-25 DIAGNOSIS — L89153 Pressure ulcer of sacral region, stage 3: Secondary | ICD-10-CM | POA: Diagnosis not present

## 2019-03-25 DIAGNOSIS — R69 Illness, unspecified: Secondary | ICD-10-CM | POA: Diagnosis not present

## 2019-03-26 ENCOUNTER — Ambulatory Visit: Payer: Medicare HMO | Admitting: General Surgery

## 2019-03-27 ENCOUNTER — Telehealth: Payer: Self-pay

## 2019-03-27 NOTE — Telephone Encounter (Signed)
Pt called and said that he is having worsening in the swelling of his leg. He said that he is having pain all the way up into his groin area.   Advised patient again per [facility] note that he needed to contact his PCP for evaluation as his vascular studies all came back normal.   He states that he will do so   York Cerise, [place]

## 2019-03-27 NOTE — Progress Notes (Addendum)
[name], [name] ([id number]) Visit Report for 03/25/2019 Arrival Information Details Patient Name: [name], [name] Date of Service: 03/25/2019 9:30 AM Medical Record Number: [id number] Patient Account Number: [id number] Date of Birth/Sex: 18-Nov-1940 (78 y.o. M) Treating RN: Cornell Barman Primary Care Chrsitopher Wik: Lucia Gaskins Other Clinician: Referring Mylynn Dinh: Lucia Gaskins Treating Tiannah Greenly/Extender: Melburn Hake, HOYT Weeks in Treatment: 1 Visit Information History Since Last Visit Added or deleted any medications: No Patient Arrived: Wheel Chair Any new allergies or adverse reactions: No Arrival Time: 09:30 Had a fall or experienced change in No Accompanied By: friend activities of daily living that may affect Transfer Assistance: None risk of falls: Patient Identification Verified: Yes Signs or symptoms of abuse/neglect since last visito No Secondary Verification Process Completed: Yes Hospitalized since last visit: No Implantable device outside of the clinic excluding No cellular tissue based products placed in the center since last visit: Has Dressing in Place as Prescribed: No Pain Present Now: Yes Electronic Signature(s) Signed: 03/25/2019 4:21:32 PM By: Lorine Bears RCP, RRT, CHT Entered By: Lorine Bears on 03/25/2019 09:34:10 [name] ([id number]) -------------------------------------------------------------------------------- Encounter Discharge Information Details Patient Name: [name] Date of Service: 03/25/2019 9:30 AM Medical Record Number: [id number] Patient Account Number: [id number] Date of Birth/Sex: [birth year] (77 y.o. M) Treating RN: Army Melia Primary Care Ilani Otterson: Lucia Gaskins Other Clinician: Referring Sasan Wilkie: Lucia Gaskins Treating Lashane Whelpley/Extender: Melburn Hake, HOYT Weeks in Treatment: 1 Encounter Discharge Information Items Post Procedure Vitals Discharge Condition:  Stable Temperature (F): 98.6 Ambulatory Status: Wheelchair Pulse (bpm): 93 Discharge Destination: Home Respiratory Rate (breaths/min): 16 Transportation: Private Auto Blood Pressure (mmHg): 121/96 Accompanied By: wife Schedule Follow-up Appointment: Yes Clinical Summary of Care: Electronic Signature(s) Signed: 03/25/2019 11:32:53 AM By: Army Melia Entered By: Army Melia on 03/25/2019 11:32:53 [name] ([id number]) -------------------------------------------------------------------------------- Lower Extremity Assessment Details Patient Name: [name] Date of Service: 03/25/2019 9:30 AM Medical Record Number: [id number] Patient Account Number: [id number] Date of Birth/Sex: Nov 15, 1940 (77 y.o. M) Treating RN: Montey Hora Primary Care Jakalyn Kratky: Lucia Gaskins Other Clinician: Referring Argel Pablo: Lucia Gaskins Treating Dezi Brauner/Extender: STONE III, HOYT Weeks in Treatment: 1 Edema Assessment Assessed: [Left: No] [Right: No] Edema: [Left: Ye] [Right: s] Calf Left: Right: Point of Measurement: 33 cm From Medial Instep cm 44 cm Ankle Left: Right: Point of Measurement: 11 cm From Medial Instep cm 25.5 cm Vascular Assessment Pulses: Dorsalis Pedis Palpable: [Right:Yes] Electronic Signature(s) Signed: 03/25/2019 4:17:58 PM By: Montey Hora Entered By: Montey Hora on 03/25/2019 09:46:06 [name] ([id number]) -------------------------------------------------------------------------------- Multi Wound Chart Details Patient Name: [name] Date of Service: 03/25/2019 9:30 AM Medical Record Number: [id number] Patient Account Number: [id number] Date of Birth/Sex: 1940/11/06 (77 y.o. M) Treating RN: Cornell Barman Primary Care Maelee Hoot: Lucia Gaskins Other Clinician: Referring Zuleyka Kloc: Lucia Gaskins Treating Juliza Machnik/Extender: STONE III, HOYT Weeks in Treatment: 1 Vital Signs Height(in): 75 Pulse(bpm): 96 Weight(lbs):  180 Blood Pressure(mmHg): 121/92 Body Mass Index(BMI): 22 Temperature(F): 98.3 Respiratory Rate 20 (breaths/min): Photos: [N/A:N/A] Wound Location: Sacrum - Midline N/A N/A Wounding Event: Gradually Appeared N/A N/A Primary Etiology: Pressure Ulcer N/A N/A Comorbid History: Glaucoma, Asthma, Chronic N/A N/A Obstructive Pulmonary Disease (COPD), Coronary Artery Disease, Type II Diabetes, Neuropathy Date Acquired: 02/11/2019 N/A N/A Weeks of Treatment: 1 N/A N/A Wound Status: Open N/A N/A Clustered Wound: Yes N/A N/A Measurements L x W x D 0.5x0.5x0.1 N/A N/A (cm) Area (cm) : 0.196 N/A N/A Volume (cm) : 0.02 N/A N/A % Reduction in Area: 94.50% N/A N/A % Reduction in  Volume: 94.30% N/A N/A Classification: Category/Stage II N/A N/A Exudate Amount: Medium N/A N/A Exudate Type: Serosanguineous N/A N/A Exudate Color: red, brown N/A N/A Wound Margin: Flat and Intact N/A N/A Granulation Amount: None Present (0%) N/A N/A Necrotic Amount: Large (67-100%) N/A N/A Exposed Structures: Fat Layer (Subcutaneous N/A N/A Tissue) Exposed: Yes Fascia: No Tendon: No [name], [name] ([id number]) Muscle: No Joint: No Bone: No Epithelialization: None N/A N/A Treatment Notes Electronic Signature(s) Signed: 03/26/2019 9:33:28 AM By: Gretta Cool, BSN, RN, CWS, Kim RN, BSN Entered By: Gretta Cool, BSN, RN, CWS, Kim on 03/25/2019 09:53:07 [name] ([id number]) -------------------------------------------------------------------------------- Multi-Disciplinary Care Plan Details Patient Name: [name] Date of Service: 03/25/2019 9:30 AM Medical Record Number: [id number] Patient Account Number: [id number] Date of Birth/Sex: 12-13-1940 (77 y.o. M) Treating RN: Cornell Barman Primary Care Hilliard Borges: Lucia Gaskins Other Clinician: Referring Amyah Clawson: Lucia Gaskins Treating Rylin Saez/Extender: Melburn Hake, HOYT Weeks in Treatment: 1 Active Inactive Electronic Signature(s) Signed: 04/08/2019  9:59:04 AM By: Gretta Cool, BSN, RN, CWS, Kim RN, BSN Previous Signature: 03/26/2019 9:33:28 AM Version By: Gretta Cool, BSN, RN, CWS, Kim RN, BSN Entered By: Gretta Cool, BSN, RN, CWS, Kim on 04/08/2019 09:59:04 [name] ([id number]) -------------------------------------------------------------------------------- Pain Assessment Details Patient Name: [name] Date of Service: 03/25/2019 9:30 AM Medical Record Number: [id number] Patient Account Number: [id number] Date of Birth/Sex: Sep 11, 1940 (77 y.o. M) Treating RN: Cornell Barman Primary Care Daron Stutz: Lucia Gaskins Other Clinician: Referring Nuriya Stuck: Lucia Gaskins Treating Emberlie Gotcher/Extender: Melburn Hake, HOYT Weeks in Treatment: 1 Active Problems Location of Pain Severity and Description of Pain Patient Has Paino Yes Site Locations Rate the pain. Current Pain Level: 10 Pain Management and Medication Current Pain Management: Electronic Signature(s) Signed: 03/25/2019 4:21:32 PM By: Lorine Bears RCP, RRT, CHT Signed: 03/26/2019 9:33:28 AM By: Gretta Cool, BSN, RN, CWS, Kim RN, BSN Entered By: Lorine Bears on 03/25/2019 09:34:33 [name] ([id number]) -------------------------------------------------------------------------------- Patient/Caregiver Education Details Patient Name: [name] Date of Service: 03/25/2019 9:30 AM Medical Record Number: [id number] Patient Account Number: [id number] Date of Birth/Gender: 1940-10-23 (77 y.o. M) Treating RN: Cornell Barman Primary Care Physician: Lucia Gaskins Other Clinician: Referring Physician: Lucia Gaskins Treating Physician/Extender: Sharalyn Ink in Treatment: 1 Education Assessment Education Provided To: Patient Education Topics Provided Pressure: Handouts: Pressure Ulcers: Care and Offloading Methods: Demonstration Responses: State content correctly Smoking and Wound Healing: Handouts: Smoking and Wound Healing Methods:  Demonstration, Explain/Verbal Responses: State content correctly Wound/Skin Impairment: Handouts: Caring for Your Ulcer Methods: Demonstration, Explain/Verbal Responses: State content correctly Electronic Signature(s) Signed: 03/26/2019 9:33:28 AM By: Gretta Cool, BSN, RN, CWS, Kim RN, BSN Entered By: Gretta Cool, BSN, RN, CWS, Kim on 03/25/2019 10:02:16 [name] ([id number]) -------------------------------------------------------------------------------- Wound Assessment Details Patient Name: [name] Date of Service: 03/25/2019 9:30 AM Medical Record Number: [id number] Patient Account Number: [id number] Date of Birth/Sex: 11-Apr-1941 (77 y.o. M) Treating RN: Montey Hora Primary Care Khaza Blansett: Lucia Gaskins Other Clinician: Referring Malayjah Otoole: Lucia Gaskins Treating Ayaansh Smail/Extender: STONE III, HOYT Weeks in Treatment: 1 Wound Status Wound Number: 1 Primary Pressure Ulcer Etiology: Wound Location: Sacrum - Midline Wound Open Wounding Event: Gradually Appeared Status: Date Acquired: 02/11/2019 Comorbid Glaucoma, Asthma, Chronic Obstructive Weeks Of Treatment: 1 History: Pulmonary Disease (COPD), Coronary Artery Clustered Wound: Yes Disease, Type II Diabetes, Neuropathy Photos Wound Measurements Length: (cm) 0.5 % Reduction in Width: (cm) 0.5 % Reduction in Depth: (cm) 0.1 Epithelializati Area: (cm) 0.196 Tunneling: Volume: (cm) 0.02 Undermining: Area: 94.5% Volume: 94.3% on: None No No Wound Description Classification: Category/Stage III  Foul Odor After Wound Margin: Flat and Intact Slough/Fibrino Exudate Amount: Medium Exudate Type: Serosanguineous Exudate Color: red, brown Cleansing: No Yes Wound Bed Granulation Amount: None Present (0%) Exposed Structure Necrotic Amount: Large (67-100%) Fascia Exposed: No Necrotic Quality: Adherent Slough Fat Layer (Subcutaneous Tissue) Exposed: Yes Tendon Exposed: No Muscle Exposed: No Joint Exposed:  No Bone Exposed: No Electronic Signature(s) [name], [name] ([id number]) Signed: 03/25/2019 4:17:58 PM By: Montey Hora Signed: 03/27/2019 7:41:37 AM By: Worthy Keeler PA-C Entered By: Worthy Keeler on 03/25/2019 12:35:12 [name] ([id number]) -------------------------------------------------------------------------------- [place] Details Patient Name: [name] Date of Service: 03/25/2019 9:30 AM Medical Record Number: [id number] Patient Account Number: [id number] Date of Birth/Sex: Nov 05, 1940 (78 y.o. M) Treating RN: Cornell Barman Primary Care Maymunah Stegemann: Lucia Gaskins Other Clinician: Referring Darsha Zumstein: Lucia Gaskins Treating Krishon Adkison/Extender: Melburn Hake, HOYT Weeks in Treatment: 1 Vital Signs Time Taken: 09:35 Temperature (F): 98.3 Height (in): 75 Pulse (bpm): 96 Weight (lbs): 180 Respiratory Rate (breaths/min): 20 Body Mass Index (BMI): 22.5 Blood Pressure (mmHg): 121/92 Reference Range: 80 - 120 mg / dl Electronic Signature(s) Signed: 03/25/2019 4:21:32 PM By: Lorine Bears RCP, RRT, CHT Entered By: Lorine Bears on 03/25/2019 09:35:37

## 2019-03-27 NOTE — Progress Notes (Addendum)
[name] ([id number]) Visit Report for 03/25/2019 Chief Complaint Document Details Patient Name: [name] Date of Service: 03/25/2019 9:30 AM Medical Record Number: [id number] Patient Account Number: [id number] Date of Birth/Sex: 23-Jan-1941 (78 y.o. M) Treating RN: Cornell Barman Primary Care Provider: Lucia Gaskins Other Clinician: Referring Provider: Lucia Gaskins Treating Provider/Extender: Melburn Hake, Hong Timm Weeks in Treatment: 1 Information Obtained from: Patient Chief Complaint 03/13/2019; patient is here for review of a collection of small wounds in the lower sacrum/coccyx Electronic Signature(s) Signed: 03/27/2019 7:41:37 AM By: Worthy Keeler PA-C Entered By: Worthy Keeler on 03/25/2019 09:49:27 [name] ([id number]) -------------------------------------------------------------------------------- Debridement Details Patient Name: [name] Date of Service: 03/25/2019 9:30 AM Medical Record Number: [id number] Patient Account Number: [id number] Date of Birth/Sex: 1941/06/03 (78 y.o. M) Treating RN: Cornell Barman Primary Care Provider: Lucia Gaskins Other Clinician: Referring Provider: Lucia Gaskins Treating Provider/Extender: Melburn Hake, Nakaila Freeze Weeks in Treatment: 1 Debridement Performed for Wound #1 Midline Sacrum Assessment: Performed By: Physician STONE III, [name] Andujo E., PA-C Debridement Type: Chemical/Enzymatic/Mechanical Agent Used: Santyl Level of Consciousness (Pre- Awake and Alert procedure): Pre-procedure Verification/Time Yes - 10:00 Out Taken: Start Time: 10:00 Pain Control: Lidocaine Instrument: Other : Tongue blade Bleeding: None End Time: 10:02 Response to Treatment: Procedure was tolerated well Level of Consciousness Awake and Alert (Post-procedure): Post Debridement Measurements of Total Wound Length: (cm) 0.5 Stage: Category/Stage III Width: (cm) 0.5 Depth: (cm) 0.1 Volume: (cm) 0.02 Character of Wound/Ulcer  Post Stable Debridement: Post Procedure Diagnosis Same as Pre-procedure Electronic Signature(s) Signed: 03/26/2019 9:33:28 AM By: Gretta Cool, BSN, RN, CWS, Kim RN, BSN Signed: 03/27/2019 7:41:37 AM By: Worthy Keeler PA-C Entered By: Worthy Keeler on 03/25/2019 12:38:27 [name] ([id number]) -------------------------------------------------------------------------------- HPI Details Patient Name: [name] Date of Service: 03/25/2019 9:30 AM Medical Record Number: [id number] Patient Account Number: [id number] Date of Birth/Sex: 07-Feb-1941 (78 y.o. M) Treating RN: Cornell Barman Primary Care Provider: Lucia Gaskins Other Clinician: Referring Provider: Lucia Gaskins Treating Provider/Extender: Melburn Hake, Stephaie Dardis Weeks in Treatment: 1 History of Present Illness HPI Description: ADMISSION 03/13/2019 Patient is a 78 year old man with type 2 diabetes and a prior history of a left above-knee amputation. Over the last month he developed a painful abruption in his lower sacrum. His wife describes this is a dark discoloration that more recently is opened into small circular wounds. The patient finds the entire area very uncomfortable. They were using Lantiseptic skin protectant but more recently bacitracin. No real improvement. The patient was seen in urgent care on 03/08/2019 given doxycycline and an antifungal cream. It is notable that he has a history of a pilonidal cyst with pilonidal cyst surgery apparently 60 years ago. He has not had any problems with this since.The patient is mostly in his wheelchair and/or recliner Past medical history; type 2 diabetes on oral agents, COPD with continued smoking, history of peripheral vascular disease. 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 03/25/19 upon evaluation today patient actually appears to be  doing well with regard to his ulcerations. Unfortunately he is not doing well in regard to his pain nor with regard to the fact that he is having right lower extremity swelling which is quite tremendous based on what he's telling me today. He states that his leg is twice the size it normally should be. He also tells  me that he is having swelling in his thigh as well is in the escrow and penis area. He states that this actually has worsened since we last saw him here in the office. The wounds themselves do not appear to be doing too poorly which is good news. With that being said I really feel like that he's actually showing signs of excellent improvement with regard to the sacral ones. In fact it only two small area still open they did have some Slough noted he did have discomfort but again this did not appear to be to significant which is good news when I was performing evaluation today. Electronic Signature(s) Signed: 03/27/2019 7:41:37 AM By: Worthy Keeler PA-C Entered By: Worthy Keeler on 03/25/2019 12:35:28 [name] ([id number]) -------------------------------------------------------------------------------- Physical Exam Details Patient Name: [name] Date of Service: 03/25/2019 9:30 AM Medical Record Number: [id number] Patient Account Number: [id number] Date of Birth/Sex: 08/10/1941 (78 y.o. M) Treating RN: Cornell Barman Primary Care Provider: Lucia Gaskins Other Clinician: Referring Provider: Lucia Gaskins Treating Provider/Extender: STONE III, Helene Bernstein Weeks in Treatment: 1 Constitutional Well-nourished and well-hydrated in no acute distress. Respiratory labored, rapid respiration though patient tells me this is normal for him due to COPD. clear to auscultation bilaterally. Cardiovascular regular rate and rhythm with normal S1, S2. 2+ pitting edema of the bilateral lower extremities. Musculoskeletal Patient unable to walk without assistance. Psychiatric this  patient is able to make decisions and demonstrates good insight into disease process. Alert and Oriented x 3. pleasant and cooperative. Notes Upon inspection patient's wound bed did require some sharp debridement although due to pain this was not actually performed. For that reason I did discuss debridement by way of medication utilizing Santyl he is in agreement with this plan and is much more pleased with going this direction as opposed to sharp debridement which again appeared to be somewhat painful for him at the last office visit. Subsequently we are gonna see you how things do with the Santyl. Electronic Signature(s) Signed: 03/27/2019 7:41:37 AM By: Worthy Keeler PA-C Entered By: Worthy Keeler on 03/25/2019 12:37:02 [name] ([id number]) -------------------------------------------------------------------------------- Physician Orders Details Patient Name: [name] Date of Service: 03/25/2019 9:30 AM Medical Record Number: [id number] Patient Account Number: [id number] Date of Birth/Sex: 1940/12/23 (77 y.o. M) Treating RN: Cornell Barman Primary Care Provider: Lucia Gaskins Other Clinician: Referring Provider: Lucia Gaskins Treating Provider/Extender: Melburn Hake, Babita Amaker Weeks in Treatment: 1 Verbal / Phone Orders: No Diagnosis Coding ICD-10 Coding Code Description L05.92 Pilonidal sinus without abscess L89.152 Pressure ulcer of sacral region, stage 2 Wound Cleansing Wound #1 Midline Sacrum o Cleanse wound with mild soap and water Anesthetic (add to Medication List) Wound #1 Midline Sacrum o Topical Lidocaine 4% cream applied to wound bed prior to debridement (In Clinic Only). Primary Wound Dressing Wound #1 Midline Sacrum o Santyl Ointment Secondary Dressing Wound #1 Midline Sacrum o Boardered Foam Dressing Dressing Change Frequency Wound #1 Midline Sacrum o Change Dressing Monday, Wednesday, Friday Follow-up Appointments Wound #1 Midline  Sacrum o Return Appointment in 1 week. Off-Loading Wound #1 Midline Sacrum o Roho cushion for wheelchair o Turn and reposition every 2 hours Medications-please add to medication list. Wound #1 Midline Sacrum o Santyl Enzymatic Ointment Patient Medications Allergies: erythromycin, lohexol, penicillin [name], DEHOYOS. ([id number]) Notifications Medication Indication Start End Santyl 03/25/2019 DOSE topical 250 unit/gram ointment - ointment topical applied nickel thick to the wound bed and then cover with a dressing as directed Electronic Signature(s)  Signed: 03/25/2019 10:21:54 AM By: Worthy Keeler PA-C Entered By: Worthy Keeler on 03/25/2019 10:21:54 [name] ([id number]) -------------------------------------------------------------------------------- Problem List Details Patient Name: [name] Date of Service: 03/25/2019 9:30 AM Medical Record Number: [id number] Patient Account Number: [id number] Date of Birth/Sex: 1941-07-11 (77 y.o. M) Treating RN: Cornell Barman Primary Care Provider: Lucia Gaskins Other Clinician: Referring Provider: Lucia Gaskins Treating Provider/Extender: Melburn Hake, Matilynn Dacey Weeks in Treatment: 1 Active Problems ICD-10 Evaluated Encounter Code Description Active Date Today Diagnosis L05.92 Pilonidal sinus without abscess 03/13/2019 No Yes L89.153 Pressure ulcer of sacral region, stage 3 03/13/2019 No Yes F17.208 Nicotine dependence, unspecified, with other nicotine- 03/25/2019 No Yes induced disorders Inactive Problems Resolved Problems Electronic Signature(s) Signed: 03/27/2019 7:41:37 AM By: Worthy Keeler PA-C Entered By: Worthy Keeler on 03/25/2019 12:41:58 [name] ([id number]) -------------------------------------------------------------------------------- Progress Note Details Patient Name: [name] Date of Service: 03/25/2019 9:30 AM Medical Record Number: [id number] Patient Account Number:  [id number] Date of Birth/Sex: August 04, 1941 (77 y.o. M) Treating RN: Cornell Barman Primary Care Provider: Lucia Gaskins Other Clinician: Referring Provider: Lucia Gaskins Treating Provider/Extender: Melburn Hake, Bernie Ransford Weeks in Treatment: 1 Subjective Chief Complaint Information obtained from Patient 03/13/2019; patient is here for review of a collection of small wounds in the lower sacrum/coccyx History of Present Illness (HPI) ADMISSION 03/13/2019 Patient is a 78 year old man with type 2 diabetes and a prior history of a left above-knee amputation. Over the last month he developed a painful abruption in his lower sacrum. His wife describes this is a dark discoloration that more recently is opened into small circular wounds. The patient finds the entire area very uncomfortable. They were using Lantiseptic skin protectant but more recently bacitracin. No real improvement. The patient was seen in urgent care on 03/08/2019 given doxycycline and an antifungal cream. It is notable that he has a history of a pilonidal cyst with pilonidal cyst surgery apparently 60 years ago. He has not had any problems with this since.The patient is mostly in his wheelchair and/or recliner Past medical history; type 2 diabetes on oral agents, COPD with continued smoking, history of peripheral vascular disease. 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 03/25/19 upon evaluation today patient actually appears to be doing well with regard to his ulcerations. Unfortunately he is not doing well in regard to his pain nor with regard to the fact that he is having right lower extremity swelling which is quite tremendous based on what he's telling me today. He states that his leg is twice the size it normally should be. He also tells me that he is having swelling in his thigh as well is in  the escrow and penis area. He states that this actually has worsened since we last saw him here in the office. The wounds themselves do not appear to be doing too poorly which is good news. With that being said I really feel like that he's actually showing signs of excellent improvement with regard to the sacral ones. In fact it only two small area still open they did have some Slough noted he did have discomfort but again this did not appear to be to significant which is good news when I was performing evaluation today. Patient History Information obtained from Patient. Family History Cancer - Father, Diabetes - Mother,Father,Siblings, Hypertension - Father,Mother,Maternal Grandparents,  Stroke - Maternal Grandparents, No family history of Heart Disease, Hereditary Spherocytosis, Kidney Disease, Lung Disease, Seizures, Thyroid Problems, Tuberculosis. Social History Current every day smoker - 60 years, Marital Status - Married, Alcohol Use - Never, Drug Use - No History, Caffeine Use - Daily - coffee. Medical History Eyes Patient has history of Glaucoma Denies history of Cataracts, Optic Neuritis [name], [name] ([id number]) Ear/Nose/Mouth/Throat Denies history of Chronic sinus problems/congestion Hematologic/Lymphatic Denies history of Anemia, Hemophilia, Human Immunodeficiency Virus, Lymphedema, Sickle Cell Disease Respiratory Patient has history of Asthma, Chronic Obstructive Pulmonary Disease (COPD) Cardiovascular Patient has history of Coronary Artery Disease Gastrointestinal Denies history of Cirrhosis , Colitis, Crohn s, Hepatitis A, Hepatitis B, Hepatitis C Endocrine Patient has history of Type II Diabetes Denies history of Type I Diabetes Genitourinary Denies history of End Stage Renal Disease Immunological Denies history of Lupus Erythematosus, Raynaud s, Scleroderma Integumentary (Skin) Denies history of History of Burn, History of pressure wounds Neurologic Patient  has history of Neuropathy - hands Denies history of Quadriplegia, Paraplegia, Seizure Disorder Oncologic Denies history of Received Chemotherapy, Received Radiation Psychiatric Denies history of Anorexia/bulimia, Confinement Anxiety Medical And Surgical History Notes Eyes laser surgery Cardiovascular Bypass 2009-2010 rheumatic fever as child Review of Systems (ROS) Constitutional Symptoms (General Health) Denies complaints or symptoms of Fatigue, Fever, Chills, Marked Weight Change. Respiratory Denies complaints or symptoms of Chronic or frequent coughs, Shortness of Breath. Cardiovascular Complains or has symptoms of LE edema. Denies complaints or symptoms of Chest pain. Psychiatric Denies complaints or symptoms of Anxiety, Claustrophobia. Objective Constitutional Well-nourished and well-hydrated in no acute distress. Vitals Time Taken: 9:35 AM, Height: 75 in, Weight: 180 lbs, BMI: 22.5, Temperature: 98.3 F, Pulse: 96 bpm, Respiratory Rate: 20 breaths/min, Blood Pressure: 121/92 mmHg. [name], [name] ([id number]) Respiratory labored, rapid respiration though patient tells me this is normal for him due to COPD. clear to auscultation bilaterally. Cardiovascular regular rate and rhythm with normal S1, S2. 2+ pitting edema of the bilateral lower extremities. Musculoskeletal Patient unable to walk without assistance. Psychiatric this patient is able to make decisions and demonstrates good insight into disease process. Alert and Oriented x 3. pleasant and cooperative. General Notes: Upon inspection patient's wound bed did require some sharp debridement although due to pain this was not actually performed. For that reason I did discuss debridement by way of medication utilizing Santyl he is in agreement with this plan and is much more pleased with going this direction as opposed to sharp debridement which again appeared to be somewhat painful for him at the last office visit.  Subsequently we are gonna see you how things do with the Santyl. Integumentary (Hair, Skin) Wound #1 status is Open. Original cause of wound was Gradually Appeared. The wound is located on the Midline Sacrum. The wound measures 0.5cm length x 0.5cm width x 0.1cm depth; 0.196cm^2 area and 0.02cm^3 volume. There is Fat Layer (Subcutaneous Tissue) Exposed exposed. There is no tunneling or undermining noted. There is a medium amount of serosanguineous drainage noted. The wound margin is flat and intact. There is no granulation within the wound bed. There is a large (67-100%) amount of necrotic tissue within the wound bed including Adherent Slough. Assessment Active Problems ICD-10 Pilonidal sinus without abscess Pressure ulcer of sacral region, stage 3 Nicotine dependence, unspecified, with other nicotine-induced disorders Procedures Wound #1 Pre-procedure diagnosis of Wound #1 is a Pressure Ulcer located on the Midline Sacrum . There was a Chemical/Enzymatic/Mechanical debridement performed by STONE III, Gayna Braddy E., PA-C. With  the following instrument(s): Tongue blade after achieving pain control using Lidocaine. Agent used was [employer]. A time out was conducted at 10:00, prior to the start of the procedure. There was no bleeding. The procedure was tolerated well. Post Debridement Measurements: 0.5cm length x 0.5cm width x 0.1cm depth; 0.02cm^3 volume. Post debridement Stage noted as Category/Stage III. Character of Wound/Ulcer Post Debridement is stable. Post procedure Diagnosis Wound #1: Same as Pre-Procedure [name], DRIGGERS. ([id number]) Plan Wound Cleansing: Wound #1 Midline Sacrum: Cleanse wound with mild soap and water Anesthetic (add to Medication List): Wound #1 Midline Sacrum: Topical Lidocaine 4% cream applied to wound bed prior to debridement (In Clinic Only). Primary Wound Dressing: Wound #1 Midline Sacrum: Santyl Ointment Secondary Dressing: Wound #1 Midline Sacrum: Boardered  Foam Dressing Dressing Change Frequency: Wound #1 Midline Sacrum: Change Dressing Monday, Wednesday, Friday Follow-up Appointments: Wound #1 Midline Sacrum: Return Appointment in 1 week. Off-Loading: Wound #1 Midline Sacrum: Roho cushion for wheelchair Turn and reposition every 2 hours Medications-please add to medication list.: Wound #1 Midline Sacrum: Santyl Enzymatic Ointment The following medication(s) was prescribed: Santyl topical 250 unit/gram ointment ointment topical applied nickel thick to the wound bed and then cover with a dressing as directed starting 03/25/2019 At this point I did have a fairly lengthy conversation with the patient concerning everything that is going on. With regard to his wounds we are gonna switch to Annitta Needs is the treatment of choice I think this will do much better for him. With regard to his right lower extremity swelling and edema I was concerned enough about this especially since the edema seems to involve the scrotal region as well as the penis according to what he's telling me that I really did feel like he might want to consider going to the hospital for further evaluation and treatment. With that being said he did tell me that his primary care provider Dr. Lorriane Shire actually has done some blood work as a last week. For that reason he wanted to wait and see him for follow-up tomorrow which is actually already scheduled at this point. I think that is okay as far as I'm concerned. With that being said I didn't want to get in touch with Dr. Margaretmary Dys office as well in order to confirm what I've seen and relate to them the concerns I had with regard to significant pain as well is what he describes as scrotal and growing swelling along with penal swelling. Nonetheless Dr. Lorriane Shire was on the background during my telephone call with his nurse and would mention about the swelling he states that the patient has already had a DVT study and at this point did  not have a DVT which is good news as far as that is concerned. With that being said he still nonetheless does have swelling as will some discoloration of the foot along with increased pain. I relate all this information to Dr. Margaretmary Dys nurse who pass along to him as well. Subsequently they will be seeing the patient in the morning. It was relayed to me that the patient is noncompliant with wearing the compression stocking he just does not want to do that. Nonetheless they did not feel he needed to go to the ER at this point for further evaluation. Therefore they will be seeing him tomorrow during the normal/regular schedule appointment. Please see above for specific wound care orders. We will see patient for re-evaluation in 1 week(s) here in the clinic. If anything worsens  or changes patient will contact our office for additional recommendations. with regard to the patient smoking actually did have a conversation with him today concerning smoking cessation. I explained the multiple health benefits from his breathing and lung condition all the way through how it affects his arteries as well as in [place], [place]. ([id number]) general how it can affect really every organ system and how stopping can actually help reverse some of the damage although not all that has been calls from 60 years of smoking. The patient states he has a prescription for Chantix is just never given it a try. Nonetheless I do believe that this point involves something he may want to consider all. He states that he is strongly thinking about that and may give it a try. Rougly 5 minutes were spent is discussing smoking cessation with the patient. Electronic Signature(s) Signed: 03/31/2019 11:42:18 PM By: Worthy Keeler PA-C Previous Signature: 03/27/2019 7:41:37 AM Version By: Worthy Keeler PA-C Entered By: Worthy Keeler on 03/31/2019 23:34:15 [name]  ([id number]) -------------------------------------------------------------------------------- ROS/PFSH Details Patient Name: [name] Date of Service: 03/25/2019 9:30 AM Medical Record Number: [id number] Patient Account Number: [id number] Date of Birth/Sex: 04/22/1941 (77 y.o. M) Treating RN: Cornell Barman Primary Care Provider: Lucia Gaskins Other Clinician: Referring Provider: Lucia Gaskins Treating Provider/Extender: Melburn Hake, Cariann Kinnamon Weeks in Treatment: 1 Information Obtained From Patient Constitutional Symptoms (General Health) Complaints and Symptoms: Negative for: Fatigue; Fever; Chills; Marked Weight Change Respiratory Complaints and Symptoms: Negative for: Chronic or frequent coughs; Shortness of Breath Medical History: Positive for: Asthma; Chronic Obstructive Pulmonary Disease (COPD) Cardiovascular Complaints and Symptoms: Positive for: LE edema Negative for: Chest pain Medical History: Positive for: Coronary Artery Disease Past Medical History Notes: Bypass 2009-2010 rheumatic fever as child Psychiatric Complaints and Symptoms: Negative for: Anxiety; Claustrophobia Medical History: Negative for: Anorexia/bulimia; Confinement Anxiety Eyes Medical History: Positive for: Glaucoma Negative for: Cataracts; Optic Neuritis Past Medical History Notes: laser surgery Ear/Nose/Mouth/Throat Medical History: Negative for: Chronic sinus problems/congestion Hematologic/Lymphatic [name], [name] ([id number]) Medical History: Negative for: Anemia; Hemophilia; Human Immunodeficiency Virus; Lymphedema; Sickle Cell Disease Gastrointestinal Medical History: Negative for: Cirrhosis ; Colitis; Crohnos; Hepatitis A; Hepatitis B; Hepatitis C Endocrine Medical History: Positive for: Type II Diabetes Negative for: Type I Diabetes Time with diabetes: 8 years Treated with: Insulin Blood sugar tested every day: No Genitourinary Medical History: Negative for: End  Stage Renal Disease Immunological Medical History: Negative for: Lupus Erythematosus; Raynaudos; Scleroderma Integumentary (Skin) Medical History: Negative for: History of Burn; History of pressure wounds Neurologic Medical History: Positive for: Neuropathy - hands Negative for: Quadriplegia; Paraplegia; Seizure Disorder Oncologic Medical History: Negative for: Received Chemotherapy; Received Radiation HBO Extended History Items Eyes: Glaucoma Immunizations Pneumococcal Vaccine: Received Pneumococcal Vaccination: No Implantable Devices None Family and Social History Cancer: Yes - Father; Diabetes: Yes - Mother,Father,Siblings; Heart Disease: No; Hereditary Spherocytosis: No; Hypertension: Yes - Father,Mother,Maternal Grandparents; Kidney Disease: No; Lung Disease: No; Seizures: No; Stroke: [name], [name] ([id number]) Yes - Maternal Grandparents; Thyroid Problems: No; Tuberculosis: No; Current every day smoker - 60 years; Marital Status - Married; Alcohol Use: Never; Drug Use: No History; Caffeine Use: Daily - coffee; Financial Concerns: No; Food, Clothing or Shelter Needs: No; Support System Lacking: No; Transportation Concerns: No Physician Affirmation I have reviewed and agree with the above information. Electronic Signature(s) Signed: 03/26/2019 9:33:28 AM By: Gretta Cool, BSN, RN, CWS, Kim RN, BSN Signed: 03/27/2019 7:41:37 AM By: Worthy Keeler PA-C Entered By: Worthy Keeler on  03/25/2019 12:35:56 [name], [name] ([id number]) -------------------------------------------------------------------------------- [place] Details Patient Name: [name] Date of Service: 03/25/2019 Medical Record Number: [id number] Patient Account Number: [id number] Date of Birth/Sex: [birth year] (78 y.o. M) Treating RN: Cornell Barman Primary Care Provider: Lucia Gaskins Other Clinician: Referring Provider: Lucia Gaskins Treating Provider/Extender: Melburn Hake, Traye Bates Weeks in Treatment:  1 Diagnosis Coding ICD-10 Codes Code Description L05.92 Pilonidal sinus without abscess L89.153 Pressure ulcer of sacral region, stage 3 F17.208 Nicotine dependence, unspecified, with other nicotine-induced disorders Facility Procedures CPT4 Code: 18590931 Description: [phone] - DEBRIDE W/O ANES NON SELECT Modifier: Quantity: 1 CPT4 Code: [id number] Description: 99406-SMOKING CESSATION 3-10MINS ICD-10 Diagnosis Description F17.208 Nicotine dependence, unspecified, with other nicotine-induc Modifier: ed disorders Quantity: 1 Physician Procedures CPT4 Code: 2575051 Description: 99214 - WC PHYS LEVEL 4 - EST PT ICD-10 Diagnosis Description L05.92 Pilonidal sinus without abscess L89.153 Pressure ulcer of sacral region, stage 3 F17.208 Nicotine dependence, unspecified, with other nicotine-induc Modifier: 25 ed disorders Quantity: 1 CPT4 Code: 83358 Description: 25189- SMOKING CESSATION 3-10 MINS ICD-10 Diagnosis Description F17.208 Nicotine dependence, unspecified, with other nicotine-induc Modifier: ed disorders Quantity: 1 Electronic Signature(s) Signed: 03/27/2019 7:41:37 AM By: Worthy Keeler PA-C Entered By: Worthy Keeler on 03/25/2019 12:42:42

## 2019-03-30 ENCOUNTER — Emergency Department (HOSPITAL_COMMUNITY): Payer: Medicare HMO

## 2019-03-30 ENCOUNTER — Inpatient Hospital Stay (HOSPITAL_COMMUNITY): Payer: Medicare HMO

## 2019-03-30 ENCOUNTER — Inpatient Hospital Stay (HOSPITAL_COMMUNITY)
Admission: EM | Admit: 2019-03-30 | Discharge: 2019-05-07 | DRG: 239 | Disposition: E | Payer: Medicare HMO | Attending: Pulmonary Disease | Admitting: Pulmonary Disease

## 2019-03-30 ENCOUNTER — Encounter (HOSPITAL_COMMUNITY): Payer: Self-pay | Admitting: Emergency Medicine

## 2019-03-30 ENCOUNTER — Other Ambulatory Visit: Payer: Self-pay

## 2019-03-30 DIAGNOSIS — Z20828 Contact with and (suspected) exposure to other viral communicable diseases: Secondary | ICD-10-CM | POA: Diagnosis present

## 2019-03-30 DIAGNOSIS — I509 Heart failure, unspecified: Secondary | ICD-10-CM

## 2019-03-30 DIAGNOSIS — N179 Acute kidney failure, unspecified: Secondary | ICD-10-CM | POA: Diagnosis present

## 2019-03-30 DIAGNOSIS — I480 Paroxysmal atrial fibrillation: Secondary | ICD-10-CM | POA: Diagnosis not present

## 2019-03-30 DIAGNOSIS — E785 Hyperlipidemia, unspecified: Secondary | ICD-10-CM | POA: Diagnosis present

## 2019-03-30 DIAGNOSIS — I428 Other cardiomyopathies: Secondary | ICD-10-CM | POA: Diagnosis present

## 2019-03-30 DIAGNOSIS — Z91041 Radiographic dye allergy status: Secondary | ICD-10-CM

## 2019-03-30 DIAGNOSIS — G931 Anoxic brain damage, not elsewhere classified: Secondary | ICD-10-CM | POA: Diagnosis not present

## 2019-03-30 DIAGNOSIS — Z23 Encounter for immunization: Secondary | ICD-10-CM | POA: Diagnosis present

## 2019-03-30 DIAGNOSIS — Z452 Encounter for adjustment and management of vascular access device: Secondary | ICD-10-CM

## 2019-03-30 DIAGNOSIS — J690 Pneumonitis due to inhalation of food and vomit: Secondary | ICD-10-CM | POA: Diagnosis present

## 2019-03-30 DIAGNOSIS — Z809 Family history of malignant neoplasm, unspecified: Secondary | ICD-10-CM

## 2019-03-30 DIAGNOSIS — I469 Cardiac arrest, cause unspecified: Secondary | ICD-10-CM | POA: Diagnosis present

## 2019-03-30 DIAGNOSIS — Z89612 Acquired absence of left leg above knee: Secondary | ICD-10-CM

## 2019-03-30 DIAGNOSIS — J9621 Acute and chronic respiratory failure with hypoxia: Secondary | ICD-10-CM | POA: Diagnosis present

## 2019-03-30 DIAGNOSIS — I081 Rheumatic disorders of both mitral and tricuspid valves: Secondary | ICD-10-CM | POA: Diagnosis present

## 2019-03-30 DIAGNOSIS — I248 Other forms of acute ischemic heart disease: Secondary | ICD-10-CM | POA: Diagnosis present

## 2019-03-30 DIAGNOSIS — N183 Chronic kidney disease, stage 3 (moderate): Secondary | ICD-10-CM | POA: Diagnosis present

## 2019-03-30 DIAGNOSIS — Z781 Physical restraint status: Secondary | ICD-10-CM

## 2019-03-30 DIAGNOSIS — Z8249 Family history of ischemic heart disease and other diseases of the circulatory system: Secondary | ICD-10-CM

## 2019-03-30 DIAGNOSIS — E872 Acidosis: Secondary | ICD-10-CM | POA: Diagnosis not present

## 2019-03-30 DIAGNOSIS — Z880 Allergy status to penicillin: Secondary | ICD-10-CM

## 2019-03-30 DIAGNOSIS — J9601 Acute respiratory failure with hypoxia: Secondary | ICD-10-CM | POA: Diagnosis not present

## 2019-03-30 DIAGNOSIS — I2489 Other forms of acute ischemic heart disease: Secondary | ICD-10-CM

## 2019-03-30 DIAGNOSIS — F17210 Nicotine dependence, cigarettes, uncomplicated: Secondary | ICD-10-CM | POA: Diagnosis present

## 2019-03-30 DIAGNOSIS — Z7982 Long term (current) use of aspirin: Secondary | ICD-10-CM

## 2019-03-30 DIAGNOSIS — I70229 Atherosclerosis of native arteries of extremities with rest pain, unspecified extremity: Secondary | ICD-10-CM

## 2019-03-30 DIAGNOSIS — I493 Ventricular premature depolarization: Secondary | ICD-10-CM | POA: Diagnosis not present

## 2019-03-30 DIAGNOSIS — I739 Peripheral vascular disease, unspecified: Secondary | ICD-10-CM | POA: Diagnosis present

## 2019-03-30 DIAGNOSIS — G9341 Metabolic encephalopathy: Secondary | ICD-10-CM | POA: Diagnosis present

## 2019-03-30 DIAGNOSIS — Z7189 Other specified counseling: Secondary | ICD-10-CM | POA: Diagnosis not present

## 2019-03-30 DIAGNOSIS — Z905 Acquired absence of kidney: Secondary | ICD-10-CM

## 2019-03-30 DIAGNOSIS — J9622 Acute and chronic respiratory failure with hypercapnia: Secondary | ICD-10-CM | POA: Diagnosis present

## 2019-03-30 DIAGNOSIS — R0902 Hypoxemia: Secondary | ICD-10-CM

## 2019-03-30 DIAGNOSIS — I472 Ventricular tachycardia, unspecified: Secondary | ICD-10-CM

## 2019-03-30 DIAGNOSIS — E1122 Type 2 diabetes mellitus with diabetic chronic kidney disease: Secondary | ICD-10-CM | POA: Diagnosis present

## 2019-03-30 DIAGNOSIS — I998 Other disorder of circulatory system: Secondary | ICD-10-CM | POA: Diagnosis present

## 2019-03-30 DIAGNOSIS — R570 Cardiogenic shock: Secondary | ICD-10-CM | POA: Diagnosis not present

## 2019-03-30 DIAGNOSIS — I255 Ischemic cardiomyopathy: Secondary | ICD-10-CM | POA: Diagnosis present

## 2019-03-30 DIAGNOSIS — G9340 Encephalopathy, unspecified: Secondary | ICD-10-CM | POA: Diagnosis not present

## 2019-03-30 DIAGNOSIS — Z8554 Personal history of malignant neoplasm of ureter: Secondary | ICD-10-CM

## 2019-03-30 DIAGNOSIS — I130 Hypertensive heart and chronic kidney disease with heart failure and stage 1 through stage 4 chronic kidney disease, or unspecified chronic kidney disease: Secondary | ICD-10-CM | POA: Diagnosis present

## 2019-03-30 DIAGNOSIS — Z720 Tobacco use: Secondary | ICD-10-CM | POA: Diagnosis present

## 2019-03-30 DIAGNOSIS — I252 Old myocardial infarction: Secondary | ICD-10-CM

## 2019-03-30 DIAGNOSIS — Z66 Do not resuscitate: Secondary | ICD-10-CM | POA: Diagnosis not present

## 2019-03-30 DIAGNOSIS — Z515 Encounter for palliative care: Secondary | ICD-10-CM | POA: Diagnosis present

## 2019-03-30 DIAGNOSIS — R4189 Other symptoms and signs involving cognitive functions and awareness: Secondary | ICD-10-CM

## 2019-03-30 DIAGNOSIS — E11649 Type 2 diabetes mellitus with hypoglycemia without coma: Secondary | ICD-10-CM | POA: Diagnosis not present

## 2019-03-30 DIAGNOSIS — E1151 Type 2 diabetes mellitus with diabetic peripheral angiopathy without gangrene: Secondary | ICD-10-CM | POA: Diagnosis present

## 2019-03-30 DIAGNOSIS — N5089 Other specified disorders of the male genital organs: Secondary | ICD-10-CM | POA: Diagnosis not present

## 2019-03-30 DIAGNOSIS — Z881 Allergy status to other antibiotic agents status: Secondary | ICD-10-CM

## 2019-03-30 DIAGNOSIS — Z4659 Encounter for fitting and adjustment of other gastrointestinal appliance and device: Secondary | ICD-10-CM

## 2019-03-30 DIAGNOSIS — I340 Nonrheumatic mitral (valve) insufficiency: Secondary | ICD-10-CM | POA: Diagnosis not present

## 2019-03-30 DIAGNOSIS — Z9049 Acquired absence of other specified parts of digestive tract: Secondary | ICD-10-CM

## 2019-03-30 DIAGNOSIS — Z794 Long term (current) use of insulin: Secondary | ICD-10-CM

## 2019-03-30 DIAGNOSIS — Z978 Presence of other specified devices: Secondary | ICD-10-CM

## 2019-03-30 DIAGNOSIS — R627 Adult failure to thrive: Secondary | ICD-10-CM | POA: Diagnosis present

## 2019-03-30 DIAGNOSIS — J449 Chronic obstructive pulmonary disease, unspecified: Secondary | ICD-10-CM | POA: Diagnosis not present

## 2019-03-30 DIAGNOSIS — J189 Pneumonia, unspecified organism: Secondary | ICD-10-CM

## 2019-03-30 DIAGNOSIS — J9382 Other air leak: Secondary | ICD-10-CM

## 2019-03-30 DIAGNOSIS — Z9289 Personal history of other medical treatment: Secondary | ICD-10-CM

## 2019-03-30 DIAGNOSIS — I2510 Atherosclerotic heart disease of native coronary artery without angina pectoris: Secondary | ICD-10-CM | POA: Diagnosis not present

## 2019-03-30 DIAGNOSIS — L89152 Pressure ulcer of sacral region, stage 2: Secondary | ICD-10-CM | POA: Diagnosis present

## 2019-03-30 DIAGNOSIS — D631 Anemia in chronic kidney disease: Secondary | ICD-10-CM | POA: Diagnosis present

## 2019-03-30 DIAGNOSIS — J9602 Acute respiratory failure with hypercapnia: Secondary | ICD-10-CM | POA: Diagnosis not present

## 2019-03-30 DIAGNOSIS — I471 Supraventricular tachycardia: Secondary | ICD-10-CM | POA: Diagnosis present

## 2019-03-30 DIAGNOSIS — Z79899 Other long term (current) drug therapy: Secondary | ICD-10-CM

## 2019-03-30 DIAGNOSIS — I361 Nonrheumatic tricuspid (valve) insufficiency: Secondary | ICD-10-CM | POA: Diagnosis not present

## 2019-03-30 DIAGNOSIS — Z951 Presence of aortocoronary bypass graft: Secondary | ICD-10-CM

## 2019-03-30 DIAGNOSIS — J9600 Acute respiratory failure, unspecified whether with hypoxia or hypercapnia: Secondary | ICD-10-CM

## 2019-03-30 DIAGNOSIS — I25119 Atherosclerotic heart disease of native coronary artery with unspecified angina pectoris: Secondary | ICD-10-CM | POA: Diagnosis present

## 2019-03-30 DIAGNOSIS — I5043 Acute on chronic combined systolic (congestive) and diastolic (congestive) heart failure: Secondary | ICD-10-CM | POA: Diagnosis present

## 2019-03-30 DIAGNOSIS — R4182 Altered mental status, unspecified: Secondary | ICD-10-CM | POA: Diagnosis present

## 2019-03-30 DIAGNOSIS — D689 Coagulation defect, unspecified: Secondary | ICD-10-CM | POA: Diagnosis present

## 2019-03-30 DIAGNOSIS — K7200 Acute and subacute hepatic failure without coma: Secondary | ICD-10-CM | POA: Diagnosis not present

## 2019-03-30 HISTORY — DX: Hyperlipidemia, unspecified: E78.5

## 2019-03-30 HISTORY — DX: Ischemic cardiomyopathy: I25.5

## 2019-03-30 HISTORY — DX: Personal history of other diseases of the circulatory system: Z86.79

## 2019-03-30 LAB — URINALYSIS, ROUTINE W REFLEX MICROSCOPIC
Bacteria, UA: NONE SEEN
Bilirubin Urine: NEGATIVE
Glucose, UA: NEGATIVE mg/dL
Hgb urine dipstick: NEGATIVE
Ketones, ur: NEGATIVE mg/dL
Leukocytes,Ua: NEGATIVE
Nitrite: NEGATIVE
Protein, ur: 100 mg/dL — AB
Specific Gravity, Urine: 1.017 (ref 1.005–1.030)
pH: 5 (ref 5.0–8.0)

## 2019-03-30 LAB — COMPREHENSIVE METABOLIC PANEL
ALT: 19 U/L (ref 0–44)
AST: 24 U/L (ref 15–41)
Albumin: 2.9 g/dL — ABNORMAL LOW (ref 3.5–5.0)
Alkaline Phosphatase: 57 U/L (ref 38–126)
Anion gap: 10 (ref 5–15)
BUN: 31 mg/dL — ABNORMAL HIGH (ref 8–23)
CO2: 20 mmol/L — ABNORMAL LOW (ref 22–32)
Calcium: 8.3 mg/dL — ABNORMAL LOW (ref 8.9–10.3)
Chloride: 105 mmol/L (ref 98–111)
Creatinine, Ser: 1.55 mg/dL — ABNORMAL HIGH (ref 0.61–1.24)
GFR calc Af Amer: 50 mL/min — ABNORMAL LOW (ref >60)
GFR calc non Af Amer: 43 mL/min — ABNORMAL LOW (ref >60)
Glucose, Bld: 169 mg/dL — ABNORMAL HIGH (ref 70–99)
Potassium: 4.6 mmol/L (ref 3.5–5.1)
Sodium: 135 mmol/L (ref 135–145)
Total Bilirubin: 0.8 mg/dL (ref 0.3–1.2)
Total Protein: 5.7 g/dL — ABNORMAL LOW (ref 6.5–8.1)

## 2019-03-30 LAB — CBC WITH DIFFERENTIAL/PLATELET
Abs Immature Granulocytes: 0.03 10*3/uL (ref 0.00–0.07)
Basophils Absolute: 0 10*3/uL (ref 0.0–0.1)
Basophils Relative: 1 %
Eosinophils Absolute: 0 10*3/uL (ref 0.0–0.5)
Eosinophils Relative: 1 %
HCT: 40.5 % (ref 39.0–52.0)
Hemoglobin: 13 g/dL (ref 13.0–17.0)
Immature Granulocytes: 0 %
Lymphocytes Relative: 22 %
Lymphs Abs: 1.7 10*3/uL (ref 0.7–4.0)
MCH: 29.3 pg (ref 26.0–34.0)
MCHC: 32.1 g/dL (ref 30.0–36.0)
MCV: 91.2 fL (ref 80.0–100.0)
Monocytes Absolute: 0.5 10*3/uL (ref 0.1–1.0)
Monocytes Relative: 7 %
Neutro Abs: 5.6 10*3/uL (ref 1.7–7.7)
Neutrophils Relative %: 69 %
Platelets: 289 10*3/uL (ref 150–400)
RBC: 4.44 MIL/uL (ref 4.22–5.81)
RDW: 17.2 % — ABNORMAL HIGH (ref 11.5–15.5)
WBC: 8 10*3/uL (ref 4.0–10.5)
nRBC: 0 % (ref 0.0–0.2)

## 2019-03-30 LAB — GLUCOSE, CAPILLARY: Glucose-Capillary: 178 mg/dL — ABNORMAL HIGH (ref 70–99)

## 2019-03-30 LAB — POCT I-STAT 7, (LYTES, BLD GAS, ICA,H+H)
Acid-base deficit: 2 mmol/L (ref 0.0–2.0)
Bicarbonate: 23 mmol/L (ref 20.0–28.0)
Calcium, Ion: 1.2 mmol/L (ref 1.15–1.40)
HCT: 41 % (ref 39.0–52.0)
Hemoglobin: 13.9 g/dL (ref 13.0–17.0)
O2 Saturation: 96 %
Patient temperature: 98.1
Potassium: 4.7 mmol/L (ref 3.5–5.1)
Sodium: 136 mmol/L (ref 135–145)
TCO2: 24 mmol/L (ref 22–32)
pCO2 arterial: 38.5 mmHg (ref 32.0–48.0)
pH, Arterial: 7.383 (ref 7.350–7.450)
pO2, Arterial: 81 mmHg — ABNORMAL LOW (ref 83.0–108.0)

## 2019-03-30 LAB — BLOOD GAS, ARTERIAL
Acid-base deficit: 3.3 mmol/L — ABNORMAL HIGH (ref 0.0–2.0)
Bicarbonate: 21.9 mmol/L (ref 20.0–28.0)
FIO2: 60
O2 Saturation: 96.8 %
Patient temperature: 37
pCO2 arterial: 35 mmHg (ref 32.0–48.0)
pH, Arterial: 7.392 (ref 7.350–7.450)
pO2, Arterial: 94.9 mmHg (ref 83.0–108.0)

## 2019-03-30 LAB — TROPONIN I (HIGH SENSITIVITY)
Troponin I (High Sensitivity): 44 ng/L — ABNORMAL HIGH (ref <18)
Troponin I (High Sensitivity): 56 ng/L — ABNORMAL HIGH (ref <18)

## 2019-03-30 LAB — MAGNESIUM: Magnesium: 2.3 mg/dL (ref 1.7–2.4)

## 2019-03-30 LAB — CBG MONITORING, ED: Glucose-Capillary: 169 mg/dL — ABNORMAL HIGH (ref 70–99)

## 2019-03-30 LAB — SARS CORONAVIRUS 2 BY RT PCR (HOSPITAL ORDER, PERFORMED IN ~~LOC~~ HOSPITAL LAB): SARS Coronavirus 2: NEGATIVE

## 2019-03-30 LAB — BRAIN NATRIURETIC PEPTIDE: B Natriuretic Peptide: 483 pg/mL — ABNORMAL HIGH (ref 0.0–100.0)

## 2019-03-30 MED ORDER — FENTANYL CITRATE (PF) 100 MCG/2ML IJ SOLN
25.0000 ug | INTRAMUSCULAR | Status: AC | PRN
  Administered 2019-04-03 – 2019-04-09 (×3): 25 ug via INTRAVENOUS

## 2019-03-30 MED ORDER — SODIUM CHLORIDE 0.9 % IV SOLN
1.0000 g | INTRAVENOUS | Status: DC
  Administered 2019-03-31: 1 g via INTRAVENOUS
  Filled 2019-03-30: qty 1
  Filled 2019-03-30: qty 10

## 2019-03-30 MED ORDER — HEPARIN SODIUM (PORCINE) 5000 UNIT/ML IJ SOLN
5000.0000 [IU] | Freq: Three times a day (TID) | INTRAMUSCULAR | Status: DC
  Administered 2019-03-31: 01:00:00 5000 [IU] via SUBCUTANEOUS
  Filled 2019-03-30: qty 1

## 2019-03-30 MED ORDER — ROCURONIUM BROMIDE 50 MG/5ML IV SOLN
INTRAVENOUS | Status: AC | PRN
  Administered 2019-03-30: 100 mg via INTRAVENOUS

## 2019-03-30 MED ORDER — FUROSEMIDE 10 MG/ML IJ SOLN
40.0000 mg | Freq: Once | INTRAMUSCULAR | Status: AC
  Administered 2019-03-30: 40 mg via INTRAVENOUS
  Filled 2019-03-30: qty 4

## 2019-03-30 MED ORDER — MIDAZOLAM HCL 2 MG/2ML IJ SOLN
2.0000 mg | Freq: Once | INTRAMUSCULAR | Status: AC
  Administered 2019-03-30: 19:00:00 2 mg via INTRAVENOUS

## 2019-03-30 MED ORDER — AMIODARONE HCL IN DEXTROSE 360-4.14 MG/200ML-% IV SOLN
30.0000 mg/h | INTRAVENOUS | Status: DC
  Administered 2019-03-30 – 2019-04-01 (×5): 30 mg/h via INTRAVENOUS
  Filled 2019-03-30 (×3): qty 200

## 2019-03-30 MED ORDER — PROPOFOL 1000 MG/100ML IV EMUL
INTRAVENOUS | Status: AC
  Administered 2019-03-30: 21:00:00 20 ug/kg/min via INTRAVENOUS
  Filled 2019-03-30: qty 100

## 2019-03-30 MED ORDER — INSULIN ASPART 100 UNIT/ML ~~LOC~~ SOLN
0.0000 [IU] | SUBCUTANEOUS | Status: DC
  Administered 2019-03-31: 3 [IU] via SUBCUTANEOUS
  Administered 2019-03-31 – 2019-04-03 (×4): 2 [IU] via SUBCUTANEOUS
  Administered 2019-04-04 (×5): 3 [IU] via SUBCUTANEOUS
  Administered 2019-04-04 – 2019-04-05 (×2): 2 [IU] via SUBCUTANEOUS
  Administered 2019-04-05: 3 [IU] via SUBCUTANEOUS
  Administered 2019-04-05: 2 [IU] via SUBCUTANEOUS
  Administered 2019-04-05 – 2019-04-06 (×4): 3 [IU] via SUBCUTANEOUS
  Administered 2019-04-06: 2 [IU] via SUBCUTANEOUS
  Administered 2019-04-06 – 2019-04-08 (×10): 3 [IU] via SUBCUTANEOUS
  Administered 2019-04-08: 16:00:00 2 [IU] via SUBCUTANEOUS
  Administered 2019-04-08 (×4): 3 [IU] via SUBCUTANEOUS
  Administered 2019-04-09 (×2): 2 [IU] via SUBCUTANEOUS
  Administered 2019-04-09 – 2019-04-10 (×6): 3 [IU] via SUBCUTANEOUS
  Administered 2019-04-10: 2 [IU] via SUBCUTANEOUS
  Administered 2019-04-10 – 2019-04-11 (×2): 3 [IU] via SUBCUTANEOUS
  Administered 2019-04-11: 04:00:00 2 [IU] via SUBCUTANEOUS
  Administered 2019-04-11 (×2): 3 [IU] via SUBCUTANEOUS

## 2019-03-30 MED ORDER — SODIUM CHLORIDE 0.9 % IV SOLN
INTRAVENOUS | Status: DC
  Administered 2019-03-31: 01:00:00 10 mL/h via INTRAVENOUS
  Administered 2019-04-02 – 2019-04-07 (×3): via INTRAVENOUS

## 2019-03-30 MED ORDER — MIDAZOLAM HCL 2 MG/2ML IJ SOLN
INTRAMUSCULAR | Status: AC
  Filled 2019-03-30: qty 2

## 2019-03-30 MED ORDER — ASPIRIN EC 81 MG PO TBEC
81.0000 mg | DELAYED_RELEASE_TABLET | Freq: Every day | ORAL | Status: DC
  Administered 2019-03-31: 01:00:00 81 mg via ORAL
  Filled 2019-03-30: qty 1

## 2019-03-30 MED ORDER — AMIODARONE HCL IN DEXTROSE 360-4.14 MG/200ML-% IV SOLN
60.0000 mg/h | INTRAVENOUS | Status: AC
  Administered 2019-03-30: 18:00:00 60 mg/h via INTRAVENOUS
  Filled 2019-03-30 (×2): qty 200

## 2019-03-30 MED ORDER — ETOMIDATE 2 MG/ML IV SOLN
INTRAVENOUS | Status: AC | PRN
  Administered 2019-03-30: 20 mg via INTRAVENOUS

## 2019-03-30 MED ORDER — MAGNESIUM SULFATE 2 GM/50ML IV SOLN
2.0000 g | INTRAVENOUS | Status: AC
  Administered 2019-03-30: 18:00:00 2 g via INTRAVENOUS
  Filled 2019-03-30: qty 50

## 2019-03-30 MED ORDER — FENTANYL CITRATE (PF) 100 MCG/2ML IJ SOLN
25.0000 ug | INTRAMUSCULAR | Status: DC | PRN
  Administered 2019-03-31: 22:00:00 50 ug via INTRAVENOUS
  Administered 2019-03-31: 20:00:00 100 ug via INTRAVENOUS
  Administered 2019-03-31: 05:00:00 50 ug via INTRAVENOUS
  Administered 2019-03-31 (×2): 100 ug via INTRAVENOUS
  Administered 2019-03-31: 20:00:00 25 ug via INTRAVENOUS
  Administered 2019-04-01 (×2): 100 ug via INTRAVENOUS
  Administered 2019-04-01: 02:00:00 50 ug via INTRAVENOUS
  Administered 2019-04-01 – 2019-04-02 (×11): 100 ug via INTRAVENOUS
  Administered 2019-04-03: 09:00:00 25 ug via INTRAVENOUS
  Administered 2019-04-03: 10:00:00 50 ug via INTRAVENOUS

## 2019-03-30 MED ORDER — FAMOTIDINE IN NACL 20-0.9 MG/50ML-% IV SOLN
20.0000 mg | INTRAVENOUS | Status: DC
  Administered 2019-03-31: 02:00:00 20 mg via INTRAVENOUS
  Filled 2019-03-30: qty 50

## 2019-03-30 MED ORDER — PROPOFOL 1000 MG/100ML IV EMUL
5.0000 ug/kg/min | INTRAVENOUS | Status: DC
  Administered 2019-03-30: 21:00:00 20 ug/kg/min via INTRAVENOUS
  Administered 2019-03-30: 23:00:00 30 ug/kg/min via INTRAVENOUS
  Administered 2019-03-31: 01:00:00 45 ug/kg/min via INTRAVENOUS
  Administered 2019-04-01: 10 ug/kg/min via INTRAVENOUS
  Administered 2019-04-02: 5 ug/kg/min via INTRAVENOUS
  Administered 2019-04-02: 3 ug/kg/min via INTRAVENOUS
  Administered 2019-04-03: 21:00:00 2 ug/kg/min via INTRAVENOUS
  Administered 2019-04-03 – 2019-04-04 (×2): 5 ug/kg/min via INTRAVENOUS
  Administered 2019-04-05 (×2): 27 ug/kg/min via INTRAVENOUS
  Administered 2019-04-05: 08:00:00 7 ug/kg/min via INTRAVENOUS
  Administered 2019-04-06 (×2): 25 ug/kg/min via INTRAVENOUS
  Administered 2019-04-06: 27 ug/kg/min via INTRAVENOUS
  Administered 2019-04-07 (×2): 25 ug/kg/min via INTRAVENOUS
  Administered 2019-04-07: 30 ug/kg/min via INTRAVENOUS
  Administered 2019-04-08 (×2): 25 ug/kg/min via INTRAVENOUS
  Administered 2019-04-08 (×3): 30 ug/kg/min via INTRAVENOUS
  Administered 2019-04-09: 11:00:00 28.603 ug/kg/min via INTRAVENOUS
  Administered 2019-04-09 – 2019-04-10 (×5): 30 ug/kg/min via INTRAVENOUS
  Filled 2019-03-30 (×5): qty 100
  Filled 2019-03-30: qty 200
  Filled 2019-03-30 (×24): qty 100

## 2019-03-30 NOTE — Progress Notes (Addendum)
eLink Physician-Brief Progress Note Patient Name: [name] DOB: 11/28/1940 MRN: [id number]   Date of Service  03/08/2019  HPI/Events of Note  78 yr male with hx of Ischmeic Cardiomyopathy, DM, PAD-LAK amputee,CABG 2010, Left Nephrectomy 2003, active smoker,  COPD with out of hospital V tach vs SVT arrest,  d fib/ACLS with ROSC. on Vent. V tach arrest mostly from multiple rounds of albuterol use  at home. on amiodarone. Data: Cr 1.55, troponin 2 nd at 55 , up from 44 ( delta gap < doubling). Covid neg. UA neg. CxR: rt IJ, no pneumo. increased int vs air space density on rt Lower side > left. ET in place, can be moved down by 2 cm.  ECHO 2018: EF 45%. mild LVH/LAD. EKG: SVT. qtc prologed 490.   Video: On PRVC- fio2 50%, peep at 5. Vt < 19ml/ibw, rate 18. on Propofol and amiodarone gtt. P peak at 22.  VS: HR 90, Temp 36, 121/101, sats 100%.    eICU Interventions  NP notes, orders reviewed. - received Unasyn for suspected PNA. Now on rocephin. pnd cultures - on VTE/SUP - on Vent/VAP bundle - continue propofol/amiodarone. Follow echo, troponin, BNP level - received Lasix 40 IV once.  - follow CTH, CT abdomen(abdomen distension: ordered by bed side MD).  If CTH neg and if troponin going up consider heparin gtt. DVT work up neg 2 weeks ago.  - avoid nephrotoxic drugs/AKI and one kidney. - follow abg. Vt goal to 8ml/ibw as tolerated to wean.         Elmer Sow 03/21/2019, 11:26 PM

## 2019-03-30 NOTE — ED Triage Notes (Signed)
Pt was found to be in vtach by ems after his wife called for not feeling well. Pt was cardioverted into an organized rhythm with pulse. Unresponsive but breathing on arrival to ED.

## 2019-03-30 NOTE — H&P (Signed)
NAME:  [name], MRN:  [id number], DOB:  Jan 27, 1941, LOS: 0 ADMISSION DATE:  03/19/2019, CONSULTATION DATE:  03/29/2019 REFERRING MD:  Forestine Na EM (Dr. Sabra Heck), CHIEF COMPLAINT:  S/p VT and 1x cardioversion  Brief History   78 yo M s/p VT with 1x cardioversion  EMS dispatched to home, patient in VT, received 120 J x1. It is not noted if patient was pulseless vs had pulse.   Taken to [facility] ED, intubated, CVC placed. Transferring to [facility]   History of present illness   History obtained per chart review  78 yo M PMH COPD, HTN, CAD with prior MI and s/p CABG 6269, ICM, systolic heart failure, DM2, CKD III (baseline Cr 1.3), hx ureter cancer s/p L nephrectomy, PVD s/p L AKA who presented to [facility] ED 7/25 s/p VT with 1x cardioversion and subsequent conversion to narrow complex tachycardic rhythm. It is unclear if patient lost pulse and ROSC achieved, or if patient retained pulse and was cardioverted x1.  On 7/25 the patient had reportedly "not been feeling well this morning," and EMS was dispatched to patient's home for AMS, altered level of consciousness. Patient has reportedly been increasingly SOB x several days and has been using BDs with increasing frequency. Patient reportedly also with severe lower extremity swelling, extending to groin. Patient reportedly unresponsive on Ems arrival, noted to be in [place]. Unclear if pulse vs pulseless VT. Unclear if cardioversion vs Defib x1 at 120J, with subsequent narrow complex rhythm. Patient required transient BVM ventilation but was reportedly breathing spontaneously. No CPR was required, patient received no other pre-hospital medications.  Upon arrival to [facility] ED, patient was intubated for unresponsiveness. A CVC was placed. Patient given amiodarone and is being transferred to [facility].   SARS-CoV2 Negative 03/28/2019  Past Medical History  COPD DM2 CAD, MI, s/p CABG  Systolic heart failure ICM HTN HLD EtOH abuse FTT PVD   Ureter Cancer s/p L nephrectomy  CKD III   Significant Hospital Events   7/25 s/p VT arrest with ROSC after 1x cardioversion 120J. Intubated at [facility], CVC placed. Transferring to [facility] for ICU admission   Consults:    Procedures:  7/25 ETT>> 7/25 CVC >>   Significant Diagnostic Tests:  CXR 7/25> ETT and CVC in adequate position. No pneumothorax. Cardiomegaly. Bilateral pulmonary infiltrates vs edema    Micro Data:  7/25 SARS CoV2 > negative   Antimicrobials:  7/25 Rocephin>>   Interim history/subjective:  Intubated at [facility], CVC placed   Objective   Blood pressure 128/87, pulse (!) 129, temperature 98.4 F (36.9 C), resp. rate 18, height 6' (1.829 m), weight 104.3 kg, SpO2 100 %.    Vent Mode: PRVC FiO2 (%):  [60 %] 60 % Set Rate:  [18 bmp] 18 bmp Vt Set:  [620 mL] 620 mL PEEP:  [5 cmH20] 5 cmH20 Plateau Pressure:  [14 cmH20-15 cmH20] 14 cmH20  No intake or output data in the 24 hours ending 03/08/2019 2119 Filed Weights   03/27/2019 1752  Weight: 104.3 kg    Examination: General: Chronically and critically ill appearing older adult male, intubated and agitated appearing HENT: NCAT. Pink mmm. Trachea midline. Copious thick clear secretions  Lungs: Diffuse bilateral rhonchi. Symmetrical chest expansion. No accessory muscle use  Cardiovascular: RRR s1s2 no rgm. BUE capillary refill < 3 seconds. RLE capillary refill < 3 seconds.  Abdomen: L flank protuberance. Abdomen soft, round, normoactive x4 Extremities: L AKA. RLE 3+ pitting edema,  RLE erythema. BUE symmetrical bulk and tone.  Neuro: R pupil 22mm L pupil 54mm, sluggish. Moves BUE RLE purposefully, is not following commands.  Skin: stage II-III sacral wound, dressing clean and intact.   Resolved Hospital Problem list     Assessment & Plan:   S/p VT with cardioversion, presumed pulseless VT. -1x CV at 120 J with resultant narrow complex tachydysrhythmia  -? If pulseless VT vs VT with pulse   -Patient transferred to [facility] as post-arrest, however It is not know if patient arrested or if patient had VT with pulse, patient is not candidate for TTM.  -? Etiology, some suspicion for tachy dysrhythmia related to increasing BD use in setting of SOB -s/p 2g mag at [facility], Amiodarone started at [facility]  -high sensitivity troponin 44 > 56 at [facility].  P STAT CT head Supportive care: MAPs > 65, currently does not require pressors  Continue amiodarone ECHO  Trend trops EKG If Head CT negative and if trops & ekg are concerning, consider heparin gtt.   Acute on chronic respiratory failure requiring intubation -intubated in setting of s/p cardiac arrest COPD -does not appear to be acute COPD exac. At this time  Likely pulmonary edema on CXR vs bilateral infiltrates -aspiration risk in setting of cardiac arrest  -Possible pulmonary edema in setting of systolic heart failure on CXR. BNP 483 P ABG  Adjust PEEP/FiO2 for SpO2 88-92  AM CXR  Rocephin per pharm for possible aspiration PNA Send trachea aspirate   HTN CAD ICM Systolic Heart Failure (last EF 45-50% in 2018) P Tele Holding home antihypertensives at this time Troponins and ECHO as above   DM2 P SSI  CKD III  -Baseline Cr approx 1.3. -risk for AKI in setting of cardiac arrest Hx Ureter cancer s/p L nephrectomy L abdominal protuberance  P  Strict I/O AM BMP, trend renal function  MAP goal > 65  CT a/p to evaluate abdominal protuberance   PVD  RLE edema, concern for cellulitis  -negative DVT studies 02/2019 -Has been on doxy for possible cellulitis P Continue to monitor Can consider repeat doppler studies  Consider Vascular cs while inpatient   Midline Sacral Wound -Followed by wound care P WOCN consult for inpatient recs    Best practice:  Diet: NPO  Pain/Anxiety/Delirium protocol (if indicated): Fent VAP protocol (if indicated): yes  DVT prophylaxis: SCD  GI prophylaxis: protonix  Glucose control: SSI Mobility: BR  Code Status: Full  Family Communication: Pending Disposition: ICU  Labs   CBC: Recent Labs  Lab 03/21/2019 1800  WBC 8.0  NEUTROABS 5.6  HGB 13.0  HCT 40.5  MCV 91.2  PLT 673    Basic Metabolic Panel: Recent Labs  Lab 03/20/2019 1838  NA 135  K 4.6  CL 105  CO2 20*  GLUCOSE 169*  BUN 31*  CREATININE 1.55*  CALCIUM 8.3*  MG 2.3   GFR: Estimated Creatinine Clearance: 50.6 mL/min (A) (by C-G formula based on SCr of 1.55 mg/dL (H)). Recent Labs  Lab 03/10/2019 1800  WBC 8.0    Liver Function Tests: Recent Labs  Lab 03/16/2019 1838  AST 24  ALT 19  ALKPHOS 57  BILITOT 0.8  PROT 5.7*  ALBUMIN 2.9*   No results for input(s): LIPASE, AMYLASE in the last 168 hours. No results for input(s): AMMONIA in the last 168 hours.  ABG    Component Value Date/Time   PHART 7.392 04/02/2019 1820   PCO2ART 35.0 03/23/2019  1820   PO2ART 94.9 03/15/2019 1820   HCO3 21.9 03/19/2019 1820   TCO2 25 06/29/2016 1200   ACIDBASEDEF 3.3 (H) 04/01/2019 1820   O2SAT 96.8 03/29/2019 1820     Coagulation Profile: No results for input(s): INR, PROTIME in the last 168 hours.  Cardiac Enzymes: No results for input(s): CKTOTAL, CKMB, CKMBINDEX, TROPONINI in the last 168 hours.  HbA1C: Hgb A1c MFr Bld  Date/Time Value Ref Range Status  05/24/2017 07:04 PM 6.8 (H) 4.8 - 5.6 % Final    Comment:    (NOTE) Pre diabetes:          5.7%-6.4% Diabetes:              >6.4% Glycemic control for   <7.0% adults with diabetes   06/29/2016 08:50 AM 6.2 (H) 4.8 - 5.6 % Final    Comment:    (NOTE)         Pre-diabetes: 5.7 - 6.4         Diabetes: >6.4         Glycemic control for adults with diabetes: <7.0     CBG: Recent Labs  Lab 03/23/2019 1817  GLUCAP 169*    Review of Systems:   Unable to obtain, patient sedated and intubated   Past Medical History  He,  has a past medical history of Anginal pain ([place]), Arthritis, Asthma, COPD (chronic  obstructive pulmonary disease) ([place]), Coronary artery disease, Gout, H/O hiatal hernia (1997), Heart murmur, High cholesterol, Hypertension, Myocardial infarction ([place]) (03/2002; ~ 2011), Neuropathy, Pneumonia, Renal disorder, Rheumatic fever, RUPTURE ROTATOR CUFF (12/03/2009), Shortness of breath, Type II diabetes mellitus ([place]), Ureter cancer ([place]) (12/2001), and UTI (urinary tract infection) (10/04/2018).   Surgical History    Past Surgical History:  Procedure Laterality Date  . ABOVE KNEE LEG AMPUTATION Left 09/2009  . BELOW KNEE LEG AMPUTATION Left 08/2009  . CARDIAC CATHETERIZATION  ~ 2010   "before the bypass" (07/09/2013)  . CARPAL TUNNEL RELEASE Bilateral ~2004; ~ 2011  . CHOLECYSTECTOMY  1980's  . CORONARY ARTERY BYPASS GRAFT  ~ 2010   "CABG X5" (07/09/2013)  . EYE SURGERY    . FEMORAL ARTERY STENT Right 07/09/2013  . GLAUCOMA SURGERY Bilateral ?[id number]'J  . LOWER EXTREMITY ANGIOGRAM Right Nov. 4, 2014  . LOWER EXTREMITY ANGIOGRAM Right 07/09/2013   Procedure: LOWER EXTREMITY ANGIOGRAM;  Surgeon: Serafina Mitchell, MD;  Location: [facility] C[facility] LAB;  Service: Cardiovascular;  Laterality: Right;  . NEPHRECTOMY Left 12/19/2001  . PERIPHERAL VASCULAR CATHETERIZATION N/A 02/03/2015   Procedure: Abdominal Aortogram;  Surgeon: Serafina Mitchell, MD;  Location: [place] CV LAB;  Service: Cardiovascular;  Laterality: N/A;  . TRIGGER FINGER RELEASE Left ~ 1990's     Social History   reports that he has been smoking cigarettes. He has a 81.00 pack-year smoking history. He has never used smokeless tobacco. He reports that he does not drink alcohol or use drugs.   Family History   His family history includes Cancer in his mother; Heart attack in his father.   Allergies Allergies  Allergen Reactions  . Erythromycin Anaphylaxis and Rash  . Iohexol Anaphylaxis, Hives, Itching and Other (See Comments)    Note:  13 HR PRE-MED GIVEN AND SUCCESSFUL-ARS 07/15/07.     Marland Kitchen Penicillins Anaphylaxis, Itching  and Other (See Comments)    Has patient had a PCN reaction causing immediate rash, facial/tongue/throat swelling, SOB or lightheadedness with hypotension: Yes Has patient had a PCN reaction causing severe rash involving mucus  membranes or skin necrosis: No Has patient had a PCN reaction that required hospitalization No Has patient had a PCN reaction occurring within the last 10 years: No If all of the above answers are "NO", then may proceed with Cephalosporin use.     Home Medications  Prior to Admission medications   Medication Sig Start Date End Date Taking? Authorizing Provider  albuterol (PROVENTIL HFA;VENTOLIN HFA) 108 (90 Base) MCG/ACT inhaler Inhale 2 puffs into the lungs every 4 (four) hours as needed for wheezing or shortness of breath.   Yes [provider]  cholecalciferol (VITAMIN D) 400 units TABS tablet Take 1,200 Units by mouth 2 (two) times daily.   Yes [provider]  metFORMIN (GLUCOPHAGE) 500 MG tablet Take 500 mg by mouth 2 (two) times daily with a meal.   Yes [provider]  mometasone-formoterol (DULERA) 200-5 MCG/ACT AERO Inhale 2 puffs into the lungs daily as needed for wheezing or shortness of breath. 10/05/18  Yes Fuller Plan A, MD  QUEtiapine (SEROQUEL) 300 MG tablet Take 300 mg by mouth at bedtime. 09/10/18  Yes [provider]  Tamsulosin HCl (FLOMAX) 0.4 MG CAPS Take 0.4 mg by mouth 2 (two) times daily.    Yes [provider]  triazolam (HALCION) 0.25 MG tablet Take 0.5 mg by mouth at bedtime as needed for sleep.    Yes [provider]  albuterol (PROVENTIL) (2.5 MG/3ML) 0.083% nebulizer solution Take 2.5 mg by nebulization every 6 (six) hours as needed for wheezing or shortness of breath.    [provider]  aspirin EC 81 MG tablet Take 81 mg by mouth at bedtime.    [provider]  doxycycline (VIBRAMYCIN) 100 MG capsule Take 1 capsule (100 mg total) by mouth 2 (two) times daily. 02/15/19    Nickel, Sharmon Leyden, NP  DULoxetine (CYMBALTA) 30 MG capsule Take 1 capsule (30 mg total) by mouth daily. 05/27/17 06/26/17  Elodia Florence., MD  gabapentin (NEURONTIN) 300 MG capsule Take 1 capsule (300 mg total) by mouth 2 (two) times daily. Pt takes two capsules in the morning and three at bedtime. Patient taking differently: Take 600-900 mg by mouth See admin instructions. Pt takes two capsules in the morning and three at bedtime. 07/02/16   Verlee Monte, MD  Gauze Pads & Dressings (FOAM DRESSING) PADS 1 patch by Does not apply route daily. 05/26/17   Elodia Florence., MD  Insulin Glargine (TOUJEO SOLOSTAR) 300 UNIT/ML SOPN Inject 5 Units into the skin daily as needed (for BS greater than 200).     [provider]  nicotine (NICODERM CQ - DOSED IN MG/24 HOURS) 21 mg/24hr patch Place 1 patch (21 mg total) onto the skin daily. 05/27/17   Elodia Florence., MD  nitroGLYCERIN (NITROSTAT) 0.4 MG SL tablet Place 0.4 mg under the tongue every 5 (five) minutes as needed for chest pain.    [provider]  Skin Protectants, Misc. [facility] SKIN PROTECTANT) 50 % OINT Apply to ulcer generously 2-3 times a day, especially following bowel movement 03/08/19   Stacey Drain [place], PA-C     Critical care time: 60 min      Eliseo Gum MSN, AGACNP-BC [place] [phone] If no answer, [phone] 03/07/2019, 9:19 PM

## 2019-03-30 NOTE — ED Notes (Signed)
[facility] left with patient at 2220

## 2019-03-30 NOTE — ED Provider Notes (Signed)
[facility] EMERGENCY DEPARTMENT Provider Note   CSN: [id number] Arrival date & time: 03/14/2019  1745     History   Chief Complaint Chief Complaint  Patient presents with  . Tachycardia    HPI [name] is a 78 y.o. male.     HPI  78 year old male, history of COPD, history of coronary disease and myocardial infarction, he has a history of type 2 diabetes.  Surgically the patient has had a bypass in 2014, he has had a left lower extremity amputation which has extended above the knee, he has had a history of ischemic cardiomyopathy.  His last echocardiogram according to the medical record showed an ejection fraction of 45 to 50% with a reduced systolic function, pattern of left ventricular hypertrophy.  A DVT study was performed approximately 1 month ago on the right leg which was negative.  The patient has had chronic swelling of that leg.  The patient presents today with abnormal mental status, unresponsiveness for which the paramedics were called to the house.  The patient apparently had "not been feeling well this morning", at some point the patient went unresponsive at home and the patient was found to be in ventricular tachycardia.  He was cardioverted with 120 J on the first attempt with a resultant narrow complex tachycardic rhythm, he did require some bag-valve-mask ventilation but arrived spontaneously breathing and completely unresponsive.  The patient is unable to give [name] any information, level 5 caveat applies.  No other medications were given prehospital including epinephrine or amiodarone, no CPR was needed.  Past Medical History:  Diagnosis Date  . Anginal pain ([place])   . Arthritis    "qwhere" (07/09/2013)  . Asthma   . COPD (chronic obstructive pulmonary disease) ([place])    pt denies this hx on 07/09/2013  . Coronary artery disease   . Gout   . H/O hiatal hernia 1997  . Heart murmur    "had rheumatic fever as a kid" (07/09/2013)  . High cholesterol   .  Hypertension   . Myocardial infarction ([place]) 03/2002; ~ 2011  . Neuropathy   . Pneumonia    "once" (07/09/2013)  . Renal disorder    "only have 1 kidney; due to go back soon to check the other one" (07/09/2013)  . Rheumatic fever   . RUPTURE ROTATOR CUFF 12/03/2009   Qualifier: Diagnosis of  By: Aline Brochure MD, Dorothyann Peng    . Shortness of breath    "can happen at anytime" (07/09/2013)  . Type II diabetes mellitus ([place])   . Ureter cancer ([place]) 12/2001   "shut down my kidney & resulted in nephrectomy" (07/09/2013)  . UTI (urinary tract infection) 10/04/2018    Patient Active Problem List   Diagnosis Date Noted  . Tobacco abuse 10/06/2018  . AKI (acute kidney injury) ([place]) 10/04/2018  . Intractable vomiting 06/03/2017  . Intractable nausea and vomiting 06/03/2017  . Constipation 06/03/2017  . Pressure injury of skin 05/25/2017  . PAD (peripheral artery disease) ([place]) 05/24/2017  . CKD (chronic kidney disease), stage III ([place]) 06/29/2016  . COPD exacerbation ([place]) 06/16/2016  . Hyperkalemia 11/24/2015  . Lower urinary tract infectious disease 11/24/2015  . Acute UTI 09/11/2014  . Acute respiratory failure with hypercapnia ([place]) 09/10/2014  . Acute encephalopathy 09/10/2014  . Alcoholic dementia ([place]) 78/93/8101  . Recurrent falls 09/10/2014  . Left shoulder pain 09/10/2014  . CAD in native artery 09/10/2014  . Diabetes mellitus with peripheral vascular disease ([place]) 09/10/2014  . FTT (  failure to thrive) in adult 09/10/2014  . Protein-calorie malnutrition ([place]) 09/10/2014  . Mixed acid base balance disorder 09/09/2014  . Acute kidney injury ([place]) 09/09/2014  . Hypotension 09/09/2014  . Fall 08/27/2014  . Altered mental status 08/26/2014  . Dehydration 08/26/2014  . Alcohol abuse 08/26/2014  . Hypertension 08/26/2014  . Aftercare following surgery of the circulatory system, [place] 08/05/2013  . Leg pain, left 07/01/2013  . Peripheral vascular disease ([place]) 05/27/2013  . Peripheral  angiopathy in diseases classified elsewhere ([place]) 05/02/2012  . Diabetes ([place]) 05/02/2012  . Pain in limb 05/02/2012  . Chronic total occlusion of artery of the extremities ([place]) 05/02/2012  . JOINT EFFUSION, KNEE 12/03/2009  . RUPTURE ROTATOR CUFF 12/03/2009  . ISCHEMIC CARDIOMYOPATHY 08/24/2009  . CHF 08/24/2009  . BACK PAIN, LUMBAR, CHRONIC 08/24/2009    Past Surgical History:  Procedure Laterality Date  . ABOVE KNEE LEG AMPUTATION Left 09/2009  . BELOW KNEE LEG AMPUTATION Left 08/2009  . CARDIAC CATHETERIZATION  ~ 2010   "before the bypass" (07/09/2013)  . CARPAL TUNNEL RELEASE Bilateral ~2004; ~ 2011  . CHOLECYSTECTOMY  1980's  . CORONARY ARTERY BYPASS GRAFT  ~ 2010   "CABG X5" (07/09/2013)  . EYE SURGERY    . FEMORAL ARTERY STENT Right 07/09/2013  . GLAUCOMA SURGERY Bilateral ?[id number]'V  . LOWER EXTREMITY ANGIOGRAM Right Nov. 4, 2014  . LOWER EXTREMITY ANGIOGRAM Right 07/09/2013   Procedure: LOWER EXTREMITY ANGIOGRAM;  Surgeon: Serafina Mitchell, MD;  Location: [facility] CATH LAB;  Service: Cardiovascular;  Laterality: Right;  . NEPHRECTOMY Left 12/19/2001  . PERIPHERAL VASCULAR CATHETERIZATION N/A 02/03/2015   Procedure: Abdominal Aortogram;  Surgeon: Serafina Mitchell, MD;  Location: [facility] CV LAB;  Service: Cardiovascular;  Laterality: N/A;  . TRIGGER FINGER RELEASE Left ~ 1990's        Home Medications    Prior to Admission medications   Medication Sig Start Date End Date Taking? Authorizing Provider  albuterol (PROVENTIL HFA;VENTOLIN HFA) 108 (90 Base) MCG/ACT inhaler Inhale 2 puffs into the lungs every 4 (four) hours as needed for wheezing or shortness of breath.    [provider]  albuterol (PROVENTIL) (2.5 MG/3ML) 0.083% nebulizer solution Take 2.5 mg by nebulization every 6 (six) hours as needed for wheezing or shortness of breath.    [provider]  aspirin EC 81 MG tablet Take 81 mg by mouth at bedtime.    [provider]  cholecalciferol  (VITAMIN D) 400 units TABS tablet Take 1,200 Units by mouth 2 (two) times daily.    [provider]  doxycycline (VIBRAMYCIN) 100 MG capsule Take 1 capsule (100 mg total) by mouth 2 (two) times daily. 02/15/19   Nickel, Sharmon Leyden, NP  DULoxetine (CYMBALTA) 30 MG capsule Take 1 capsule (30 mg total) by mouth daily. 05/27/17 06/26/17  Elodia Florence., MD  gabapentin (NEURONTIN) 300 MG capsule Take 1 capsule (300 mg total) by mouth 2 (two) times daily. Pt takes two capsules in the morning and three at bedtime. Patient taking differently: Take 600-900 mg by mouth See admin instructions. Pt takes two capsules in the morning and three at bedtime. 07/02/16   Verlee Monte, MD  Gauze Pads & Dressings (FOAM DRESSING) PADS 1 patch by Does not apply route daily. 05/26/17   Elodia Florence., MD  Insulin Glargine (TOUJEO SOLOSTAR) 300 UNIT/ML SOPN Inject 5 Units into the skin daily as needed (for BS greater than 200).     [provider]  metFORMIN (GLUCOPHAGE) 500 MG tablet Take 500 mg by mouth 2 (two) times daily with a meal.    [provider]  mometasone-formoterol (DULERA) 200-5 MCG/ACT AERO Inhale 2 puffs into the lungs daily as needed for wheezing or shortness of breath. 10/05/18   Fuller Plan A, MD  nicotine (NICODERM CQ - DOSED IN MG/24 HOURS) 21 mg/24hr patch Place 1 patch (21 mg total) onto the skin daily. 05/27/17   Elodia Florence., MD  nitroGLYCERIN (NITROSTAT) 0.4 MG SL tablet Place 0.4 mg under the tongue every 5 (five) minutes as needed for chest pain.    [provider]  QUEtiapine (SEROQUEL) 300 MG tablet Take 300 mg by mouth at bedtime. 09/10/18   [provider]  Skin Protectants, Misc. [facility] SKIN PROTECTANT) 50 % OINT Apply to ulcer generously 2-3 times a day, especially following bowel movement 03/08/19   Wurst, [place], PA-C  Tamsulosin HCl (FLOMAX) 0.4 MG CAPS Take 0.4 mg by mouth 2 (two) times daily.     [provider]  triazolam (HALCION) 0.25 MG tablet Take 0.5 mg by mouth at bedtime as needed for sleep.     [provider]    Family History Family History  Problem Relation Age of Onset  . Cancer Mother   . Heart attack Father     Social History Social History   Tobacco Use  . Smoking status: Current Every Day Smoker    Packs/day: 1.50    Years: 54.00    Pack years: 81.00    Types: Cigarettes  . Smokeless tobacco: Never Used  Substance Use Topics  . Alcohol use: No  . Drug use: No     Allergies   Erythromycin, Iohexol, and Penicillins   Review of Systems Review of Systems  All other systems reviewed and are negative.    Physical Exam Updated Vital Signs Pulse (!) 144   Resp 20   Ht 1.829 m (6')   Wt 104.3 kg   SpO2 96%   BMI 31.19 kg/m   Physical Exam Vitals signs and nursing note reviewed.  Constitutional:      General: He is in acute distress.     Appearance: He is well-developed. He is ill-appearing and toxic-appearing.  HENT:     Head: Normocephalic and atraumatic.     Mouth/Throat:     Pharynx: No oropharyngeal exudate.  Eyes:     General: No scleral icterus.       Right eye: No discharge.        Left eye: No discharge.     Conjunctiva/sclera: Conjunctivae normal.     Pupils: Pupils are equal, round, and reactive to light.  Neck:     Musculoskeletal: Normal range of motion and neck supple.     Thyroid: No thyromegaly.     Vascular: No JVD.  Cardiovascular:     Rate and Rhythm: Tachycardia present.     Heart sounds: Normal heart sounds. No murmur. No friction rub. No gallop.   Pulmonary:     Effort: Respiratory distress present.     Breath sounds: Rhonchi present.     Comments: Inadequate respirations though he is spontaneously breathing approximately 8 to 10 breaths/min, lots of rhonchi and mucus in the airway Abdominal:     General: Bowel sounds are normal. There is no distension.     Palpations: Abdomen is soft. There is no mass.      Tenderness: There is no abdominal tenderness.  Musculoskeletal:  Normal range of motion.        General: Swelling present. No tenderness.     Comments: Significant edema of the right lower extremity diffusely  Lymphadenopathy:     Cervical: No cervical adenopathy.  Skin:    General: Skin is warm and dry.     Findings: No erythema or rash.  Neurological:     Comments: Obtunded, no response to painful stimuli, gag reflex is present  Psychiatric:        Behavior: Behavior normal.      ED Treatments / Results  Labs (all labs ordered are listed, but only abnormal results are displayed) Labs Reviewed - No data to display  EKG EKG Interpretation  Date/Time:  Saturday March 30 2019 17:55:11 EDT Ventricular Rate:  152 PR Interval:    QRS Duration: 118 QT Interval:  312 QTC Calculation: 497 R Axis:   147 Text Interpretation:  Supraventricular tachycardia Nonspecific intraventricular conduction delay Abnormal lateral Q waves Anterior infarct, old Minimal ST depression, inferior leads Lead(s) aVR were not used for morphology analysis Since last tracing rate faster Confirmed by Noemi Chapel [phone]) on 03/09/2019 6:04:03 PM   Radiology Dg Chest 1v Repeat Same Day  Result Date: 03/27/2019 CLINICAL DATA:  Central line placement EXAM: CHEST - 1 VIEW SAME DAY COMPARISON:  March 30, 2019 at 6:21 p.m. FINDINGS: There is a newly placed right-sided central venous catheter with tip projecting over the proximal SVC. The endotracheal tube terminates above the carina by approximately 7.8 cm. The enteric tube is not fully visualized on this exam. Bilateral pulmonary opacities are again noted heart size is enlarged. Aortic calcifications are noted. The patient is status post prior median sternotomy. IMPRESSION: 1. Right-sided central venous catheter as above with no evidence of a right-sided pneumothorax. 2. Otherwise, stable appearance of the chest. The lung bases were not visualized on this exam.  Electronically Signed   By: Constance Holster M.D.   On: 03/20/2019 19:30   Dg Chest Port 1 View  Result Date: 03/12/2019 CLINICAL DATA:  Arrest, tube placement EXAM: PORTABLE CHEST 1 VIEW COMPARISON:  Portable exam 1821 hours compared to 10/04/2018 FINDINGS: Tip of endotracheal tube 8.2 cm above carina. Nasogastric tube extends into abdomen. Enlargement of cardiac silhouette with pulmonary vascular congestion. Atherosclerotic calcification aorta. Perihilar infiltrates likely pulmonary edema. No pleural effusion or pneumothorax. IMPRESSION: Tube positions as above. BILATERAL pulmonary infiltrates question pulmonary edema. Electronically Signed   By: Lavonia Dana M.D.   On: 03/20/2019 18:56    Procedures .Critical Care Performed by: Noemi Chapel, MD Authorized by: Noemi Chapel, MD   Critical care provider statement:    Critical care time (minutes):  35   Critical care time was exclusive of:  Separately billable procedures and treating other patients and teaching time   Critical care was necessary to treat or prevent imminent or life-threatening deterioration of the following conditions:  Cardiac failure   Critical care was time spent personally by me on the following activities:  Blood draw for specimens, development of treatment plan with patient or surrogate, discussions with consultants, evaluation of patient's response to treatment, examination of patient, obtaining history from patient or surrogate, ordering and performing treatments and interventions, ordering and review of laboratory studies, ordering and review of radiographic studies, pulse oximetry, re-evaluation of patient's condition and review of old charts Comments:       Procedure Name: Intubation Date/Time: 03/21/2019 6:05 PM Performed by: Noemi Chapel, MD Pre-anesthesia Checklist: Patient identified, Patient being monitored, Emergency  Drugs available, Timeout performed and Suction available Oxygen Delivery Method:  Non-rebreather mask Preoxygenation: Pre-oxygenation with 100% oxygen Induction Type: Rapid sequence Ventilation: Mask ventilation without difficulty Laryngoscope Size: Mac and 4 Grade View: Grade II Tube size: 8.0 mm Number of attempts: 1 Airway Equipment and Method: Stylet (direct laryngoscopy) Placement Confirmation: ETT inserted through vocal cords under direct vision,  CO2 detector and Breath sounds checked- equal and bilateral Secured at: 23 cm Tube secured with: ETT holder Dental Injury: Teeth and Oropharynx as per pre-operative assessment  Difficulty Due To: Difficulty was unanticipated Comments:      OG placement  Date/Time: 03/20/2019 6:06 PM Performed by: Noemi Chapel, MD Authorized by: Noemi Chapel, MD  Consent: The procedure was performed in an emergent situation. Required items: required blood products, implants, devices, and special equipment available Patient identity confirmed: verbally with patient Time out: Immediately prior to procedure a "time out" was called to verify the correct patient, procedure, equipment, support staff and site/side marked as required. Preparation: Patient was prepped and draped in the usual sterile fashion. Local anesthesia used: no  Anesthesia: Local anesthesia used: no Patient tolerance: patient tolerated the procedure well with no immediate complications Comments: This was placed after intubation prior to x-ray placement   .Central Line  Date/Time: 03/22/2019 8:21 PM Performed by: Noemi Chapel, MD Authorized by: Noemi Chapel, MD   Consent:    Consent obtained:  Verbal (verbally confirmed with wife)   Consent given by:  Spouse   Risks discussed:  Bleeding, arterial puncture, incorrect placement, infection and nerve damage   Alternatives discussed:  Alternative treatment Pre-procedure details:    Hand hygiene: Hand hygiene performed prior to insertion     Sterile barrier technique: All elements of maximal sterile  technique followed     Skin preparation:  ChloraPrep   Skin preparation agent: Skin preparation agent completely dried prior to procedure   Anesthesia (see MAR for exact dosages):    Anesthesia method:  None Procedure details:    Location:  R internal jugular   Patient position:  Trendelenburg   Procedural supplies:  Triple lumen   Catheter size:  7 Fr   Landmarks identified: yes     Ultrasound guidance: yes     Sterile ultrasound techniques: Sterile gel and sterile probe covers were used     Number of attempts:  1   Successful placement: yes   Post-procedure details:    Post-procedure:  Dressing applied and line sutured   Assessment:  Blood return through all ports, no pneumothorax on x-ray, placement verified by x-ray and free fluid flow   Patient tolerance of procedure:  Tolerated well, no immediate complications Comments:      I personally looked at the x-ray and confirm placement of the IV.  All 3 ports pole and flushed without difficulty, no pneumothorax.   (including critical care time)  Medications Ordered in ED Medications  rocuronium (ZEMURON) injection (100 mg Intravenous Given 03/08/2019 1751)  etomidate (AMIDATE) injection (20 mg Intravenous Given 03/09/2019 1750)  amiodarone (NEXTERONE PREMIX) 360-4.14 MG/200ML-% (1.8 mg/mL) IV infusion (has no administration in time range)  amiodarone (NEXTERONE PREMIX) 360-4.14 MG/200ML-% (1.8 mg/mL) IV infusion (has no administration in time range)  magnesium sulfate IVPB 2 g 50 mL (has no administration in time range)     Initial Impression / Assessment and Plan / ED Course  I have reviewed the triage vital signs and the nursing notes.  Pertinent labs & imaging results that were available during my care  of the patient were reviewed by me and considered in my medical decision making (see chart for details).  Clinical Course as of Mar 29 2020  Sat Mar 30, 2019  1818 Further information obtained from the patient's spouse who states  that he was very short of breath over the last several days, he has noticed significant and severe swelling of his leg going up into his groin and despite taking multiple nebulizer treatments today and not improving they continue to give them to the point where he was getting a nebulizer treatment when he went unresponsive stating that he was very very short of breath.  With his more recent information I suspect that it was in part the stimulatory effects of the bronchodilator that possibly threw him into a severe arrhythmia.   [BM]    Clinical Course User Index [BM] Noemi Chapel, MD       This patient is critically ill, he appears to have a tachycardia on arrival, on my interpretation of the EKG it does appear to have more of a sinus tachycardic appearance to it with some P waves visualized.  The QRS complex is wide but it has been on previous EKGs as well.  His prehospital cardiac tracing showed ventricular tachycardia which was very clear and subsequent conversion to a sinus rhythm after the shock.  I will start amiodarone, I personally intubated the patient successfully on the first attempt, an NG tube was placed by myself as well.  I have directed the resuscitation and will continue this after discussion with cardiology as well.  The patient is critically ill and will need intensive care unit treatment.  While the patient was in the department he continued to have a slight improvement in his heart rate.  Due to the potential need for significant sedation or vasopressor a central line was placed in the right internal jugular vein after I discussed this thoroughly with the patient spouse.  It was agreed upon that this would be done to avoid needing to use small peripheral IVs and the anticipated need for further blood draws.  Please see procedure note above, the patient tolerated this very well.  I have paged critical care after discussion with hospitalist as they do not feel comfortable take care  of this patient at this time, here at this hospital without weekend coverage for cardiology or pulmonology.  I discussed care with Dr. Prudencio Burly at 8:45 - has accepted transfer of patient to [facility] ICU  Final Clinical Impressions(s) / ED Diagnoses   Final diagnoses:  Ventricular tachycardia ([place])  Acute respiratory failure, unspecified whether with hypoxia or hypercapnia ([place])  Suzi Roots, MD 03/19/2019 2057

## 2019-03-31 ENCOUNTER — Inpatient Hospital Stay (HOSPITAL_COMMUNITY): Payer: Medicare HMO

## 2019-03-31 ENCOUNTER — Encounter (HOSPITAL_COMMUNITY): Payer: Self-pay | Admitting: Cardiology

## 2019-03-31 DIAGNOSIS — I5043 Acute on chronic combined systolic (congestive) and diastolic (congestive) heart failure: Secondary | ICD-10-CM

## 2019-03-31 DIAGNOSIS — E1151 Type 2 diabetes mellitus with diabetic peripheral angiopathy without gangrene: Secondary | ICD-10-CM

## 2019-03-31 DIAGNOSIS — I469 Cardiac arrest, cause unspecified: Secondary | ICD-10-CM

## 2019-03-31 DIAGNOSIS — J9602 Acute respiratory failure with hypercapnia: Secondary | ICD-10-CM

## 2019-03-31 DIAGNOSIS — J9601 Acute respiratory failure with hypoxia: Secondary | ICD-10-CM

## 2019-03-31 DIAGNOSIS — J9621 Acute and chronic respiratory failure with hypoxia: Secondary | ICD-10-CM

## 2019-03-31 DIAGNOSIS — I255 Ischemic cardiomyopathy: Secondary | ICD-10-CM

## 2019-03-31 DIAGNOSIS — I509 Heart failure, unspecified: Secondary | ICD-10-CM

## 2019-03-31 DIAGNOSIS — I248 Other forms of acute ischemic heart disease: Secondary | ICD-10-CM

## 2019-03-31 DIAGNOSIS — I25119 Atherosclerotic heart disease of native coronary artery with unspecified angina pectoris: Secondary | ICD-10-CM

## 2019-03-31 DIAGNOSIS — I472 Ventricular tachycardia: Principal | ICD-10-CM

## 2019-03-31 DIAGNOSIS — I739 Peripheral vascular disease, unspecified: Secondary | ICD-10-CM

## 2019-03-31 DIAGNOSIS — I361 Nonrheumatic tricuspid (valve) insufficiency: Secondary | ICD-10-CM

## 2019-03-31 DIAGNOSIS — I340 Nonrheumatic mitral (valve) insufficiency: Secondary | ICD-10-CM

## 2019-03-31 LAB — ECHOCARDIOGRAM COMPLETE
Height: 72 in
Weight: 3460.34 oz

## 2019-03-31 LAB — POCT I-STAT 7, (LYTES, BLD GAS, ICA,H+H)
Acid-base deficit: 1 mmol/L (ref 0.0–2.0)
Bicarbonate: 22.6 mmol/L (ref 20.0–28.0)
Calcium, Ion: 1.17 mmol/L (ref 1.15–1.40)
HCT: 38 % — ABNORMAL LOW (ref 39.0–52.0)
Hemoglobin: 12.9 g/dL — ABNORMAL LOW (ref 13.0–17.0)
O2 Saturation: 96 %
Patient temperature: 96.8
Potassium: 4.4 mmol/L (ref 3.5–5.1)
Sodium: 135 mmol/L (ref 135–145)
TCO2: 24 mmol/L (ref 22–32)
pCO2 arterial: 32.7 mmHg (ref 32.0–48.0)
pH, Arterial: 7.443 (ref 7.350–7.450)
pO2, Arterial: 71 mmHg — ABNORMAL LOW (ref 83.0–108.0)

## 2019-03-31 LAB — COMPREHENSIVE METABOLIC PANEL
ALT: 17 U/L (ref 0–44)
AST: 19 U/L (ref 15–41)
Albumin: 2.8 g/dL — ABNORMAL LOW (ref 3.5–5.0)
Alkaline Phosphatase: 59 U/L (ref 38–126)
Anion gap: 11 (ref 5–15)
BUN: 30 mg/dL — ABNORMAL HIGH (ref 8–23)
CO2: 23 mmol/L (ref 22–32)
Calcium: 8.4 mg/dL — ABNORMAL LOW (ref 8.9–10.3)
Chloride: 102 mmol/L (ref 98–111)
Creatinine, Ser: 1.77 mg/dL — ABNORMAL HIGH (ref 0.61–1.24)
GFR calc Af Amer: 42 mL/min — ABNORMAL LOW (ref >60)
GFR calc non Af Amer: 36 mL/min — ABNORMAL LOW (ref >60)
Glucose, Bld: 145 mg/dL — ABNORMAL HIGH (ref 70–99)
Potassium: 4.5 mmol/L (ref 3.5–5.1)
Sodium: 136 mmol/L (ref 135–145)
Total Bilirubin: 0.6 mg/dL (ref 0.3–1.2)
Total Protein: 5.4 g/dL — ABNORMAL LOW (ref 6.5–8.1)

## 2019-03-31 LAB — TYPE AND SCREEN
ABO/RH(D): O POS
Antibody Screen: NEGATIVE

## 2019-03-31 LAB — URINALYSIS, MICROSCOPIC (REFLEX)
Bacteria, UA: NONE SEEN
Squamous Epithelial / LPF: NONE SEEN (ref 0–5)

## 2019-03-31 LAB — GLUCOSE, CAPILLARY
Glucose-Capillary: 127 mg/dL — ABNORMAL HIGH (ref 70–99)
Glucose-Capillary: 65 mg/dL — ABNORMAL LOW (ref 70–99)
Glucose-Capillary: 113 mg/dL — ABNORMAL HIGH (ref 70–99)
Glucose-Capillary: 103 mg/dL — ABNORMAL HIGH (ref 70–99)
Glucose-Capillary: 104 mg/dL — ABNORMAL HIGH (ref 70–99)

## 2019-03-31 LAB — BASIC METABOLIC PANEL
Anion gap: 13 (ref 5–15)
Anion gap: 10 (ref 5–15)
BUN: 29 mg/dL — ABNORMAL HIGH (ref 8–23)
BUN: 33 mg/dL — ABNORMAL HIGH (ref 8–23)
CO2: 21 mmol/L — ABNORMAL LOW (ref 22–32)
CO2: 23 mmol/L (ref 22–32)
Calcium: 8.7 mg/dL — ABNORMAL LOW (ref 8.9–10.3)
Calcium: 8.3 mg/dL — ABNORMAL LOW (ref 8.9–10.3)
Chloride: 102 mmol/L (ref 98–111)
Chloride: 104 mmol/L (ref 98–111)
Creatinine, Ser: 1.8 mg/dL — ABNORMAL HIGH (ref 0.61–1.24)
Creatinine, Ser: 2.16 mg/dL — ABNORMAL HIGH (ref 0.61–1.24)
GFR calc Af Amer: 41 mL/min — ABNORMAL LOW (ref >60)
GFR calc Af Amer: 33 mL/min — ABNORMAL LOW (ref >60)
GFR calc non Af Amer: 36 mL/min — ABNORMAL LOW (ref >60)
GFR calc non Af Amer: 29 mL/min — ABNORMAL LOW (ref >60)
Glucose, Bld: 128 mg/dL — ABNORMAL HIGH (ref 70–99)
Glucose, Bld: 101 mg/dL — ABNORMAL HIGH (ref 70–99)
Potassium: 4.7 mmol/L (ref 3.5–5.1)
Potassium: 5 mmol/L (ref 3.5–5.1)
Sodium: 136 mmol/L (ref 135–145)
Sodium: 137 mmol/L (ref 135–145)

## 2019-03-31 LAB — CBC
HCT: 40 % (ref 39.0–52.0)
Hemoglobin: 13.1 g/dL (ref 13.0–17.0)
MCH: 29.8 pg (ref 26.0–34.0)
MCHC: 32.8 g/dL (ref 30.0–36.0)
MCV: 91.1 fL (ref 80.0–100.0)
Platelets: 228 10*3/uL (ref 150–400)
RBC: 4.39 MIL/uL (ref 4.22–5.81)
RDW: 16.8 % — ABNORMAL HIGH (ref 11.5–15.5)
WBC: 6.6 10*3/uL (ref 4.0–10.5)
nRBC: 0 % (ref 0.0–0.2)

## 2019-03-31 LAB — CBC WITH DIFFERENTIAL/PLATELET
Abs Immature Granulocytes: 0.02 10*3/uL (ref 0.00–0.07)
Basophils Absolute: 0 10*3/uL (ref 0.0–0.1)
Basophils Relative: 1 %
Eosinophils Absolute: 0 10*3/uL (ref 0.0–0.5)
Eosinophils Relative: 0 %
HCT: 35.7 % — ABNORMAL LOW (ref 39.0–52.0)
Hemoglobin: 11.6 g/dL — ABNORMAL LOW (ref 13.0–17.0)
Immature Granulocytes: 0 %
Lymphocytes Relative: 15 %
Lymphs Abs: 0.9 10*3/uL (ref 0.7–4.0)
MCH: 29.6 pg (ref 26.0–34.0)
MCHC: 32.5 g/dL (ref 30.0–36.0)
MCV: 91.1 fL (ref 80.0–100.0)
Monocytes Absolute: 0.5 10*3/uL (ref 0.1–1.0)
Monocytes Relative: 8 %
Neutro Abs: 4.7 10*3/uL (ref 1.7–7.7)
Neutrophils Relative %: 76 %
Platelets: 218 10*3/uL (ref 150–400)
RBC: 3.92 MIL/uL — ABNORMAL LOW (ref 4.22–5.81)
RDW: 16.7 % — ABNORMAL HIGH (ref 11.5–15.5)
WBC: 6.1 10*3/uL (ref 4.0–10.5)
nRBC: 0 % (ref 0.0–0.2)

## 2019-03-31 LAB — URINALYSIS, ROUTINE W REFLEX MICROSCOPIC
Bilirubin Urine: NEGATIVE
Glucose, UA: NEGATIVE mg/dL
Ketones, ur: NEGATIVE mg/dL
Nitrite: NEGATIVE
Protein, ur: NEGATIVE mg/dL
Specific Gravity, Urine: 1.01 (ref 1.005–1.030)
pH: 5.5 (ref 5.0–8.0)

## 2019-03-31 LAB — TROPONIN I (HIGH SENSITIVITY)
Troponin I (High Sensitivity): 76 ng/L — ABNORMAL HIGH (ref <18)
Troponin I (High Sensitivity): 79 ng/L — ABNORMAL HIGH (ref <18)
Troponin I (High Sensitivity): 86 ng/L — ABNORMAL HIGH (ref <18)
Troponin I (High Sensitivity): 89 ng/L — ABNORMAL HIGH (ref <18)

## 2019-03-31 LAB — PROTIME-INR
INR: 1.4 — ABNORMAL HIGH (ref 0.8–1.2)
Prothrombin Time: 16.7 seconds — ABNORMAL HIGH (ref 11.4–15.2)

## 2019-03-31 LAB — MRSA PCR SCREENING: MRSA by PCR: NEGATIVE

## 2019-03-31 LAB — BRAIN NATRIURETIC PEPTIDE: B Natriuretic Peptide: 634.4 pg/mL — ABNORMAL HIGH (ref 0.0–100.0)

## 2019-03-31 LAB — PHOSPHORUS
Phosphorus: 4.1 mg/dL (ref 2.5–4.6)
Phosphorus: 3.9 mg/dL (ref 2.5–4.6)

## 2019-03-31 LAB — APTT: aPTT: 36 seconds (ref 24–36)

## 2019-03-31 LAB — HEPARIN LEVEL (UNFRACTIONATED)
Heparin Unfractionated: 0.2 IU/mL — ABNORMAL LOW (ref 0.30–0.70)
Heparin Unfractionated: 0.21 IU/mL — ABNORMAL LOW (ref 0.30–0.70)

## 2019-03-31 LAB — MAGNESIUM
Magnesium: 2.1 mg/dL (ref 1.7–2.4)
Magnesium: 2.1 mg/dL (ref 1.7–2.4)

## 2019-03-31 MED ORDER — CHLORHEXIDINE GLUCONATE CLOTH 2 % EX PADS
6.0000 | MEDICATED_PAD | Freq: Every day | CUTANEOUS | Status: DC
  Administered 2019-03-31 – 2019-04-02 (×3): 6 via TOPICAL

## 2019-03-31 MED ORDER — ASPIRIN 81 MG PO CHEW
81.0000 mg | CHEWABLE_TABLET | Freq: Every day | ORAL | Status: DC
  Administered 2019-04-01 – 2019-04-11 (×11): 81 mg
  Filled 2019-03-31 (×12): qty 1

## 2019-03-31 MED ORDER — SODIUM CHLORIDE 0.9% FLUSH
10.0000 mL | Freq: Two times a day (BID) | INTRAVENOUS | Status: DC
  Administered 2019-03-31: 21:00:00 20 mL
  Administered 2019-03-31 – 2019-04-01 (×2): 10 mL
  Administered 2019-04-01: 21:00:00 20 mL
  Administered 2019-04-02 – 2019-04-07 (×9): 10 mL

## 2019-03-31 MED ORDER — FAMOTIDINE 40 MG/5ML PO SUSR
20.0000 mg | Freq: Every day | ORAL | Status: DC
  Administered 2019-03-31 – 2019-04-10 (×10): 20 mg
  Filled 2019-03-31 (×11): qty 2.5

## 2019-03-31 MED ORDER — SODIUM CHLORIDE 0.9 % IV SOLN
2.0000 g | INTRAVENOUS | Status: DC
  Administered 2019-03-31 – 2019-04-02 (×3): 2 g via INTRAVENOUS
  Filled 2019-03-31: qty 2
  Filled 2019-03-31 (×2): qty 20
  Filled 2019-03-31: qty 2

## 2019-03-31 MED ORDER — HEPARIN (PORCINE) 25000 UT/250ML-% IV SOLN
2050.0000 [IU]/h | INTRAVENOUS | Status: AC
  Administered 2019-03-31: 1000 [IU]/h via INTRAVENOUS
  Administered 2019-04-01 – 2019-04-02 (×3): 1550 [IU]/h via INTRAVENOUS
  Administered 2019-04-03: 21:00:00 1600 [IU]/h via INTRAVENOUS
  Administered 2019-04-03: 04:00:00 1550 [IU]/h via INTRAVENOUS
  Administered 2019-04-04: 11:00:00 1700 [IU]/h via INTRAVENOUS
  Filled 2019-03-31 (×8): qty 250

## 2019-03-31 MED ORDER — FUROSEMIDE 10 MG/ML IJ SOLN
40.0000 mg | Freq: Once | INTRAMUSCULAR | Status: AC
  Administered 2019-03-31: 18:00:00 40 mg via INTRAVENOUS
  Filled 2019-03-31: qty 4

## 2019-03-31 MED ORDER — FUROSEMIDE 10 MG/ML IJ SOLN
80.0000 mg | Freq: Once | INTRAMUSCULAR | Status: AC
  Administered 2019-03-31: 03:00:00 80 mg via INTRAVENOUS
  Filled 2019-03-31: qty 8

## 2019-03-31 MED ORDER — CHLORHEXIDINE GLUCONATE 0.12% ORAL RINSE (MEDLINE KIT)
15.0000 mL | Freq: Two times a day (BID) | OROMUCOSAL | Status: DC
  Administered 2019-03-31 – 2019-04-11 (×23): 15 mL via OROMUCOSAL

## 2019-03-31 MED ORDER — NOREPINEPHRINE 16 MG/250ML-% IV SOLN
0.0000 ug/min | INTRAVENOUS | Status: DC
  Administered 2019-03-31: 11:00:00 10 ug/min via INTRAVENOUS
  Administered 2019-04-03: 2 ug/min via INTRAVENOUS
  Administered 2019-04-05: 7 ug/min via INTRAVENOUS
  Filled 2019-03-31 (×6): qty 250

## 2019-03-31 MED ORDER — ORAL CARE MOUTH RINSE
15.0000 mL | OROMUCOSAL | Status: DC
  Administered 2019-03-31 – 2019-04-11 (×107): 15 mL via OROMUCOSAL

## 2019-03-31 MED ORDER — DEXMEDETOMIDINE HCL IN NACL 400 MCG/100ML IV SOLN
0.4000 ug/kg/h | INTRAVENOUS | Status: DC
  Filled 2019-03-31: qty 100

## 2019-03-31 MED ORDER — SODIUM CHLORIDE 0.9% FLUSH: 10.0000 mL | INTRAVENOUS | Status: DC | PRN

## 2019-03-31 MED ORDER — ACETAMINOPHEN 160 MG/5ML PO SOLN
650.0000 mg | Freq: Four times a day (QID) | ORAL | Status: DC | PRN
  Administered 2019-03-31 – 2019-04-10 (×9): 650 mg
  Filled 2019-03-31 (×9): qty 20.3

## 2019-03-31 MED ORDER — DEXMEDETOMIDINE HCL IN NACL 200 MCG/50ML IV SOLN
0.4000 ug/kg/h | INTRAVENOUS | Status: DC
  Administered 2019-03-31: 0.6 ug/kg/h via INTRAVENOUS
  Filled 2019-03-31: qty 50

## 2019-03-31 MED ORDER — FENTANYL 2500MCG IN NS 250ML (10MCG/ML) PREMIX INFUSION
0.0000 ug/h | INTRAVENOUS | Status: DC
  Administered 2019-03-31: 175 ug/h via INTRAVENOUS
  Administered 2019-03-31: 03:00:00 25 ug/h via INTRAVENOUS
  Administered 2019-04-01: 100 ug/h via INTRAVENOUS
  Administered 2019-04-01: 250 ug/h via INTRAVENOUS
  Administered 2019-04-02: 23:00:00 200 ug/h via INTRAVENOUS
  Administered 2019-04-02: 275 ug/h via INTRAVENOUS
  Administered 2019-04-02: 11:00:00 200 ug/h via INTRAVENOUS
  Administered 2019-04-04: 06:00:00 75 ug/h via INTRAVENOUS
  Administered 2019-04-05: 09:00:00 10 ug/h via INTRAVENOUS
  Administered 2019-04-06: 125 ug/h via INTRAVENOUS
  Administered 2019-04-07 – 2019-04-09 (×2): 75 ug/h via INTRAVENOUS
  Administered 2019-04-10: 100 ug/h via INTRAVENOUS
  Administered 2019-04-11: 300 ug/h via INTRAVENOUS
  Administered 2019-04-11: 175 ug/h via INTRAVENOUS
  Filled 2019-03-31 (×16): qty 250

## 2019-03-31 MED ORDER — DEXTROSE 50 % IV SOLN
INTRAVENOUS | Status: AC
  Administered 2019-03-31: 25 mL
  Filled 2019-03-31: qty 50

## 2019-03-31 NOTE — Progress Notes (Signed)
ANTICOAGULATION CONSULT NOTE - Initial Consult  Pharmacy Consult for heparin Indication: chest pain/ACS  Allergies  Allergen Reactions  . Erythromycin Anaphylaxis and Rash  . Iohexol Anaphylaxis, Hives, Itching and Other (See Comments)    Note:  13 HR PRE-MED GIVEN AND SUCCESSFUL-ARS 07/15/07.     Marland Kitchen Penicillins Anaphylaxis, Itching and Other (See Comments)    Has patient had a PCN reaction causing immediate rash, facial/tongue/throat swelling, SOB or lightheadedness with hypotension: Yes Has patient had a PCN reaction causing severe rash involving mucus membranes or skin necrosis: No Has patient had a PCN reaction that required hospitalization No Has patient had a PCN reaction occurring within the last 10 years: No If all of the above answers are "NO", then may proceed with Cephalosporin use.    Patient Measurements: Height: 6' (182.9 cm) Weight: 216 lb 0.8 oz (98 kg) IBW/kg (Calculated) : 77.6 Heparin Dosing Weight: 97kg  Vital Signs: Temp: 96.8 F (36 C) (07/26 0200) Temp Source: Oral (07/25 2300) BP: 108/76 (07/26 0200) Pulse Rate: 80 (07/26 0100)  Labs: Recent Labs    03/06/2019 1800 03/26/2019 1838 03/22/2019 2019 03/29/2019 2338 03/31/19 0110  HGB 13.0  --   --  13.9 11.6*  HCT 40.5  --   --  41.0 35.7*  PLT 289  --   --   --  218  APTT  --   --   --   --  36  LABPROT  --   --   --   --  16.7*  INR  --   --   --   --  1.4*  CREATININE  --  1.55*  --   --  1.77*  TROPONINIHS  --  44* 56*  --  76*    Estimated Creatinine Clearance: 43.1 mL/min (A) (by C-G formula based on SCr of 1.77 mg/dL (H)).   Medical History: Past Medical History:  Diagnosis Date  . Anginal pain ([place])   . Arthritis    "qwhere" (07/09/2013)  . Asthma   . COPD (chronic obstructive pulmonary disease) ([place])    pt denies this hx on 07/09/2013  . Coronary artery disease   . Gout   . H/O hiatal hernia 1997  . Heart murmur    "had rheumatic fever as a kid" (07/09/2013)  . High cholesterol   .  Hypertension   . Myocardial infarction ([place]) 03/2002; ~ 2011  . Neuropathy   . Pneumonia    "once" (07/09/2013)  . Renal disorder    "only have 1 kidney; due to go back soon to check the other one" (07/09/2013)  . Rheumatic fever   . RUPTURE ROTATOR CUFF 12/03/2009   Qualifier: Diagnosis of  By: Aline Brochure MD, Dorothyann Peng    . Shortness of breath    "can happen at anytime" (07/09/2013)  . Type II diabetes mellitus ([place])   . Ureter cancer ([place]) 12/2001   "shut down my kidney & resulted in nephrectomy" (07/09/2013)  . UTI (urinary tract infection) 10/04/2018     Assessment: 67 yoM with VT and concerns for ACS with rising troponins. CT head negative, no AC noted PTA. Pt received subcutaneous heparin 1hr ago so will defer bolus.  Goal of Therapy:  Heparin level 0.3-0.7 units/ml Monitor platelets by anticoagulation protocol: Yes   Plan:  Heparin 1000 units/hr Check heparin level in 8hr   Arrie Senate, PharmD, BCPS Clinical Pharmacist Please check AMION for all [facility] Pharmacy numbers 03/31/2019

## 2019-03-31 NOTE — Progress Notes (Signed)
Paged MD concerning pts decreased UOP.  MD wants bladder scan and continue to monitor. RN will continue to monitor.

## 2019-03-31 NOTE — Consult Note (Signed)
Cardiology Consultation:   Patient ID: [name] MRN: [id number]; DOB: [birth year]  Admit date: 03/14/2019 Date of Consult: 03/31/2019  Primary Care Provider: Lucia Gaskins, MD Primary Cardiologist: No primary care provider on file.  Had been followed by Dr. Cindie Laroche, need to see [facility] Primary Electrophysiologist:  None n/a --Initial cardiology treatment with CABG at [place], [place]   Patient Profile:   [name] is a 78 y.o. male with a hx of CAD-prior MI with CABG (per family CABG x5 was in 2004 -[place] -by report, has had at least one catheterization prior to his AKA with no stents.) resulting in ischemic cardiomyopathy with chronic combined systolic and diastolic heart failure), COPD, PAD (right SFA stent, left AKA), hypertension, who is being seen today for the evaluation of post cardiac arrest at the request of Dr Loanne Drilling.  SARS-CoV2 Negative 03/11/2019  History of Present Illness:   [name] presented to [facility], ER on July 25 status post cardiac arrest with VT (unsure if pulseless VT versus VT with pulse) -> via EMS 1 shock with restoration of ROSC.Marland Kitchen  No code cool because of early restoration of perfusing rhythm.  Patient was intubated for what appeared to be acute pulmonary edema and hypoxic/hypercapnic respiratory failure.  EKG with no sign of STEMI.  Simply IVCD noted.  Reportedly had been feeling unwell yesterday morning.  EMS was called for altered mental status altered level consciousness.  Apparently he been noticing increasing dyspnea over the last several days reportedly using bronchodilators more frequently.  Also noted more swelling extending up to the groin.  He has been noticing orthopnea, but not necessarily any PND.  Has been using his nebulizers more.  He has been having edema more so than usual in his right leg.  Per EMS report was unresponsive that upon their arrival and  noted to be in ventricular tachycardia (but was not clear if the patient was actually pulseless or did have pulse).  Regardless was treated with 1 shock 120J resulting in narrow complex rhythm.  After transient bag-valve-mask the patient was breathing spontaneously.  He was intubated upon arrival to the ER, started on antibiotics. Started on amiodarone, has maintained sinus rhythm  Significant Hospital Events   7/25 s/p VT arrest with ROSC after 1x cardioversion 120J. Intubated at [facility], CVC placed. Transferring to [facility] for ICU admission  --Hoping to extubate today  Heart Pathway Score:     Past Medical History:  Diagnosis Date   Arthritis    "qwhere" (07/09/2013)   Asthma    COPD (chronic obstructive pulmonary disease) ([place])    pt denies this hx on 07/09/2013; has chronic dyspnea   Coronary artery disease    Reportedly multivessel disease-CABG   Gout    H/O hiatal hernia 1997   H/O: rheumatic fever    "had rheumatic fever as a kid" (07/09/2013) -has murmur   Hyperlipidemia with target LDL less than 70    Hypertension    Ischemic cardiomyopathy    By report,-ICD   Myocardial infarction ([place]) 03/2002; ~ 2011   Neuropathy    PAD (peripheral artery disease) ([place]) 2014   Right SFA stent November 2014; status post left AKA   Pneumonia    "once" (07/09/2013)   Renal disorder    "only have 1 kidney; due to go back soon to check the other one" (07/09/2013)   RUPTURE ROTATOR CUFF 12/03/2009   Qualifier: Diagnosis of  By: Aline Brochure MD, [place]     Type II diabetes mellitus [facility])    Ureter cancer ([place]) 12/2001   "shut down my kidney & resulted in nephrectomy" (07/09/2013)   UTI (urinary tract infection) 10/04/2018     Past Surgical History:  Procedure Laterality Date   ABOVE KNEE LEG AMPUTATION Left 09/2009   BELOW KNEE LEG AMPUTATION Left 08/2009   CARDIAC CATHETERIZATION  ~ 2010   "before the bypass" (07/09/2013)   CARPAL TUNNEL RELEASE Bilateral ~2004;  ~ 2011   CHOLECYSTECTOMY  1980's   CORONARY ARTERY BYPASS GRAFT  ~ 2010   "CABG X5" (07/09/2013)   EYE SURGERY     FEMORAL ARTERY STENT Right 07/09/2013   GLAUCOMA SURGERY Bilateral ?[id number]'L   LOWER EXTREMITY ANGIOGRAM Right Nov. 4, 2014   LOWER EXTREMITY ANGIOGRAM Right 07/09/2013   Procedure: LOWER EXTREMITY ANGIOGRAM;  Surgeon: Serafina Mitchell, MD;  Location: [facility] CATH LAB;  Service: Cardiovascular;  Laterality: Right;   NEPHRECTOMY Left 12/19/2001   PERIPHERAL VASCULAR CATHETERIZATION N/A 02/03/2015   Procedure: Abdominal Aortogram;  Surgeon: Serafina Mitchell, MD;  Location: [facility] CV LAB;  Service: Cardiovascular;  Laterality: N/A;   TRIGGER FINGER RELEASE Left ~ 1990's    Aortic angiogram and bilateral lower extremity runoff May 2016: No significant aortoiliac disease.  Right lower extremity-widely patent stent but no significant change from previous.  Home Medications:  Prior to Admission medications   Medication Sig Start Date End Date Taking? Authorizing Provider  albuterol (PROVENTIL HFA;VENTOLIN HFA) 108 (90 Base) MCG/ACT inhaler Inhale 2 puffs into the lungs every 4 (four) hours as needed for wheezing or shortness of breath.   Yes [provider]  cholecalciferol (VITAMIN D) 400 units TABS tablet Take 1,200 Units by mouth 2 (two) times daily.   Yes [provider]  metFORMIN (GLUCOPHAGE) 500 MG tablet Take 500 mg by mouth 2 (two) times daily with a meal.   Yes [provider]  mometasone-formoterol (DULERA) 200-5 MCG/ACT AERO Inhale 2 puffs into the lungs daily as needed for wheezing or shortness of breath. 10/05/18  Yes Fuller Plan A, MD  QUEtiapine (SEROQUEL) 300 MG tablet Take 300 mg by mouth at bedtime. 09/10/18  Yes [provider]  Tamsulosin HCl (FLOMAX) 0.4 MG CAPS Take 0.4 mg by mouth 2 (two) times daily.    Yes [provider]  triazolam (HALCION) 0.25 MG tablet Take 0.5 mg by mouth at bedtime as needed for sleep.     Yes [provider]  albuterol (PROVENTIL) (2.5 MG/3ML) 0.083% nebulizer solution Take 2.5 mg by nebulization every 6 (six) hours as needed for wheezing or shortness of breath.    [provider]  aspirin EC 81 MG tablet Take 81 mg by mouth at bedtime.    [provider]  doxycycline (VIBRAMYCIN) 100 MG capsule Take 1 capsule (100 mg total) by mouth 2 (two) times daily. 02/15/19   Nickel, Sharmon Leyden, NP  DULoxetine (CYMBALTA) 30 MG capsule Take 1 capsule (30 mg total) by mouth daily. 05/27/17 06/26/17  Elodia Florence., MD  gabapentin (NEURONTIN) 300 MG capsule Take 1 capsule (300 mg total) by mouth 2 (two) times daily. Pt takes two capsules in the morning and three at bedtime. Patient taking differently: Take 600-900 mg by mouth See admin instructions. Pt takes two capsules in the morning and three at bedtime. 07/02/16   Verlee Monte, MD  Gauze Pads & Dressings (FOAM DRESSING) PADS 1 patch by Does not apply  route daily. 05/26/17   Elodia Florence., MD  Insulin Glargine (TOUJEO SOLOSTAR) 300 UNIT/ML SOPN Inject 5 Units into the skin daily as needed (for BS greater than 200).     [provider]  nicotine (NICODERM CQ - DOSED IN MG/24 HOURS) 21 mg/24hr patch Place 1 patch (21 mg total) onto the skin daily. 05/27/17   Elodia Florence., MD  nitroGLYCERIN (NITROSTAT) 0.4 MG SL tablet Place 0.4 mg under the tongue every 5 (five) minutes as needed for chest pain.    [provider]  Skin Protectants, Misc. [facility] SKIN PROTECTANT) 50 % OINT Apply to ulcer generously 2-3 times a day, especially following bowel movement 03/08/19   Stacey Drain [place], [place]    Inpatient Medications: Scheduled Meds:  [START ON 04/01/2019] aspirin  81 mg Per Tube Daily   chlorhexidine gluconate (MEDLINE KIT)  15 mL Mouth Rinse BID   Chlorhexidine Gluconate Cloth  6 each Topical Daily   famotidine  20 mg Per Tube QHS   insulin aspart  0-15 Units Subcutaneous  Q4H   mouth rinse  15 mL Mouth Rinse 10 times per day   [COMPLETED] midazolam       sodium chloride flush  10-40 mL Intracatheter Q12H   Continuous Infusions:  sodium chloride 10 mL/hr at 03/31/19 1500   amiodarone 30 mg/hr (03/31/19 1500)   cefTRIAXone (ROCEPHIN)  IV     dexmedetomidine Stopped (03/31/19 1147)   fentaNYL infusion INTRAVENOUS 100 mcg/hr (03/31/19 1536)   heparin 1,300 Units/hr (03/31/19 1500)   norepinephrine (LEVOPHED) Adult infusion 6 mcg/min (03/31/19 1500)   propofol (DIPRIVAN) infusion Stopped (03/31/19 0446)   PRN Meds: fentaNYL (SUBLIMAZE) injection, fentaNYL (SUBLIMAZE) injection, sodium chloride flush  Allergies:    Allergies  Allergen Reactions   Erythromycin Anaphylaxis and Rash   Iohexol Anaphylaxis, Hives, Itching and Other (See Comments)    Note:  13 HR PRE-MED GIVEN AND SUCCESSFUL-ARS 07/15/07.      Penicillins Anaphylaxis, Itching and Other (See Comments)    Has patient had a PCN reaction causing immediate rash, facial/tongue/throat swelling, SOB or lightheadedness with hypotension: Yes Has patient had a PCN reaction causing severe rash involving mucus membranes or skin necrosis: No Has patient had a PCN reaction that required hospitalization No Has patient had a PCN reaction occurring within the last 10 years: No If all of the above answers are "NO", then may proceed with Cephalosporin use.    Social History:   Social History   Socioeconomic History   Marital status: Married    Spouse name: Not on file   Number of children: Not on file   Years of education: Not on file   Highest education level: Not on file  Occupational History   Not on file  Social Needs   Financial resource strain: Not on file   Food insecurity    Worry: Not on file    Inability: Not on file   Transportation needs    Medical: Not on file    Non-medical: Not on file  Tobacco Use   Smoking status: Current Every Day Smoker    Packs/day:  1.50    Years: 54.00    Pack years: 81.00    Types: Cigarettes   Smokeless tobacco: Never Used  Substance and Sexual Activity   Alcohol use: No   Drug use: No   Sexual activity: Not Currently  Lifestyle   Physical activity    Days per week: Not on file  Minutes per session: Not on file   Stress: Not on file  Relationships   Social connections    Talks on phone: Not on file    Gets together: Not on file    Attends religious service: Not on file    Active member of club or organization: Not on file    Attends meetings of clubs or organizations: Not on file    Relationship status: Not on file   Intimate partner violence    Fear of current or ex partner: Not on file    Emotionally abused: Not on file    Physically abused: Not on file    Forced sexual activity: Not on file  Other Topics Concern   Not on file  Social History Narrative   Not on file    Family History:    Family History  Problem Relation Age of Onset   Cancer Mother    Heart attack Father      ROS:  Please see the history of present illness.  Unable to perform as the patient is intubated. Per his wife, he was actually doing relatively well with no chest pain or pressure, just the dyspnea increased use of nebulizers and edema noted above. No fevers, chills or sweats.  No nausea/vomiting except for the morning prior to presentation. All other ROS reviewed and negative.     Physical Exam/Data:   Vitals:   03/31/19 1300 03/31/19 1400 03/31/19 1500 03/31/19 1543  BP: 107/80 104/74 115/75 114/80  Pulse: 83 89 77 94  Resp: _0 Temp: 99 F (37.2 C) 99.3 F (37.4 C) 99.7 F (37.6 C)   TempSrc:  Core (Comment)    SpO2: 93% 98% 97% 96%  Weight:      Height:        Intake/Output Summary (Last 24 hours) at 03/31/2019 1651 Last data filed at 03/31/2019 1500 Gross per 24 hour  Intake 1160.57 ml  Output 935 ml  Net 225.57 ml   Last 3 Weights 03/31/2019 03/31/2019 03/13/2019  Weight  (lbs) 216 lb 4.3 oz 216 lb 0.8 oz 230 lb  Weight (kg) 98.1 kg 98 kg 104.327 kg     Body mass index is 29.33 kg/m.  General: Well-nourished, well-groomed gentleman.  No acute distress intubated and sedated.  Not responsive because of sedation.  (Per nurse, was opening eyes to commands prior to re-sedation) HEENT: normal; pupils are asymmetric right greater than left. Lymph: no adenopathy  Neck: + JVD with bounding pulsations of the right IJ line.  Difficult to assess because of intubation Endocrine:  No thryomegaly  Vascular: No carotid bruits; bilateral femoral artery bruits. Cardiac:  normal but distant S1, S2; RRR with ectopy; cannot exclude soft HSM at apex, however difficult to hear with defibrillator pads in place and coarse breath sounds.  Also cannot exclude S4 gallop. Lungs: Diffuse coarse upper respiratory breath sounds from ventilator.  Diminished sounds in the bases.  Rales versus rhonchi in bilateral lower lobes.  Sounds like Abd: soft, nontender, no hepatomegaly; decreased bowel sounds Ext: Right leg 2+ edema from groin to foot Musculoskeletal: Right foot has violaceous PAD/venous stasis changes.  SCD in place on the right leg with Skin: warm and dry  Neuro: Intubated and sedated  EKG:    The EKG March 30, 2019 at 1755 was personally reviewed and demonstrates: Sinus tachycardia, rate 150 bpm.  Nonspecific IVCD with a anterior infarct pattern (septal Q waves with poor R wave progression.  Low  voltage in limb leads.  The EKG March 31, 2019 at 1755 was personally reviewed and demonstrates: Sinus rhythm and 1 degree AVB.  Rate 74 bpm.  Nonspecific IVCD.  Telemetry:  Telemetry was personally reviewed and demonstrates: Sinus rhythm  Relevant CV Studies:  Echocardiogram September 2018: EF 45-50%.  Mild LVH.  Unable to assess regional wall motion.  LA mildly dilated.  Echocardiogram March 31, 2019: EF 25-30%.  Severely reduced function.  Unable to really assess regional wall motion.   RV is mildly dilated, but has normal function.  Moderate RA and mild LA dilation.  Mild to moderate MR.  Moderate TR.  Dilated IVC with <50% respiratory variability.  Laboratory Data:  High Sensitivity Troponin:   Recent Labs  Lab 04/03/2019 1838 03/08/2019 2019 03/31/19 0110 03/31/19 0224 03/31/19 1037  TROPONINIHS 44* 56* 76* 79* 86*     Cardiac EnzymesNo results for input(s): TROPONINI in the last 168 hours. No results for input(s): TROPIPOC in the last 168 hours.  Chemistry Recent Labs  Lab 03/06/2019 1838  03/31/19 0110 03/31/19 0224 03/31/19 0343  NA 135   < > 136 136 135  K 4.6   < > 4.5 4.7 4.4  CL 105  --  102 102  --   CO2 20*  --  23 21*  --   GLUCOSE 169*  --  145* 128*  --   BUN 31*  --  30* 29*  --   CREATININE 1.55*  --  1.77* 1.80*  --   CALCIUM 8.3*  --  8.4* 8.7*  --   GFRNONAA 43*  --  36* 36*  --   GFRAA 50*  --  42* 41*  --   ANIONGAP 10  --  11 13  --    < > = values in this interval not displayed.    Recent Labs  Lab 04/04/2019 1838 03/31/19 0110  PROT 5.7* 5.4*  ALBUMIN 2.9* 2.8*  AST 24 19  ALT 19 17  ALKPHOS 57 59  BILITOT 0.8 0.6   Hematology Recent Labs  Lab 03/26/2019 1800  03/31/19 0110 03/31/19 0224 03/31/19 0343  WBC 8.0  --  6.1 6.6  --   RBC 4.44  --  3.92* 4.39  --   HGB 13.0   < > 11.6* 13.1 12.9*  HCT 40.5   < > 35.7* 40.0 38.0*  MCV 91.2  --  91.1 91.1  --   MCH 29.3  --  29.6 29.8  --   MCHC 32.1  --  32.5 32.8  --   RDW 17.2*  --  16.7* 16.8*  --   PLT 289  --  218 228  --    < > = values in this interval not displayed.   BNP Recent Labs  Lab 03/24/2019 1800 03/31/19 0224  BNP 483.0* 634.4*    DDimer No results for input(s): DDIMER in the last 168 hours.   Radiology/Studies:  Ct Abdomen Pelvis Wo Contrast  Result Date: 03/31/2019 CLINICAL DATA:  Abdominal distension. EXAM: CT ABDOMEN AND PELVIS WITHOUT CONTRAST TECHNIQUE: Multidetector CT imaging of the abdomen and pelvis was performed following the standard  protocol without IV contrast. COMPARISON:  October 04, 2018 FINDINGS: Lower chest: There is a moderate right and small left pleural effusion. There is consolidation involving the bilateral lung bases.The heart size is significantly enlarged. There is generalized volume overload. Hepatobiliary: The liver is normal. The gallbladder is not visualized and may be surgically absent.There is  no biliary ductal dilation. Pancreas: Normal contours without ductal dilatation. No peripancreatic fluid collection. Spleen: No splenic laceration or hematoma. Adrenals/Urinary Tract: --Adrenal glands: No adrenal hemorrhage. --Right kidney/ureter: No hydronephrosis or perinephric hematoma. --Left kidney/ureter: The left kidney is surgically absent. --Urinary bladder: The bladder is decompressed with a Foley catheter and therefore is poorly evaluated. Stomach/Bowel: --Stomach/Duodenum: Enteric tube terminates in the region of the gastric pylorus/first portion of the duodenum. --Small bowel: No dilatation or inflammation. --Colon: No focal abnormality. --Appendix: Normal. Vascular/Lymphatic: Atherosclerotic calcification is present within the non-aneurysmal abdominal aorta, without hemodynamically significant stenosis. --No retroperitoneal lymphadenopathy. --No mesenteric lymphadenopathy. --No pelvic or inguinal lymphadenopathy. Reproductive: Unremarkable Other: There is a small volume of free fluid in the abdomen and pelvis. There is generalized volume overload with body wall edema. Musculoskeletal. The patient is status post prior vertebral augmentation of the L1 vertebral body. There are degenerative changes throughout the visualized thoracolumbar spine. IMPRESSION: 1. Moderate right and small left pleural effusions. 2. Bibasilar airspace opacities, favored to represent atelectasis on the right with an infiltrate or aspiration on the left. 3. Cardiomegaly. There is generalized volume overload with a small amount of free fluid in the  abdomen and pelvis. 4. Solitary right kidney without evidence of hydronephrosis. 5. Additional chronic findings as above. Electronically Signed   By: Constance Holster M.D.   On: 03/31/2019 01:08   Ct Head Wo Contrast  Result Date: 03/31/2019 CLINICAL DATA:  78 year old male with cardiac arrest. EXAM: CT HEAD WITHOUT CONTRAST TECHNIQUE: Contiguous axial images were obtained from the base of the skull through the vertex without intravenous contrast. COMPARISON:  Head CT dated 06/29/2016 FINDINGS: Brain: Mild-to-moderate age-related atrophy and chronic microvascular ischemic changes. There is no acute intracranial hemorrhage. No mass effect or midline shift. No extra-axial fluid collection. Vascular: No hyperdense vessel or unexpected calcification. Skull: Normal. Negative for fracture or focal lesion. Sinuses/Orbits: No acute finding. Other: Partially visualized endotracheal and enteric tubes. IMPRESSION: 1. No acute intracranial hemorrhage. 2. Age-related atrophy and chronic microvascular ischemic changes. Electronically Signed   By: Anner Crete M.D.   On: 03/31/2019 01:12   Dg Chest 1v Repeat Same Day  Result Date: 03/11/2019 CLINICAL DATA:  Central line placement EXAM: CHEST - 1 VIEW SAME DAY COMPARISON:  March 30, 2019 at 6:21 p.m. FINDINGS: There is a newly placed right-sided central venous catheter with tip projecting over the proximal SVC. The endotracheal tube terminates above the carina by approximately 7.8 cm. The enteric tube is not fully visualized on this exam. Bilateral pulmonary opacities are again noted heart size is enlarged. Aortic calcifications are noted. The patient is status post prior median sternotomy. IMPRESSION: 1. Right-sided central venous catheter as above with no evidence of a right-sided pneumothorax. 2. Otherwise, stable appearance of the chest. The lung bases were not visualized on this exam. Electronically Signed   By: Constance Holster M.D.   On: 03/07/2019 19:30    Dg Chest Port 1 View  Result Date: 03/25/2019 CLINICAL DATA:  Intubation EXAM: PORTABLE CHEST 1 VIEW COMPARISON:  March 30, 2019 FINDINGS: The endotracheal tube terminates approximately 6.6 cm above the carina. The enteric tube appears to extend below the left hemidiaphragm. The right-sided central venous catheter is well position. The heart size remains enlarged. There is no pneumothorax. Bibasilar airspace opacities are again noted. There are small bilateral pleural effusions. IMPRESSION: 1. Lines and tubes as above. 2. No significant interval change including persistent bibasilar airspace opacities and likely trace to small bilateral pleural effusions.  Electronically Signed   By: Constance Holster M.D.   On: 03/20/2019 23:29   Dg Chest Port 1 View  Result Date: 03/29/2019 CLINICAL DATA:  Arrest, tube placement EXAM: PORTABLE CHEST 1 VIEW COMPARISON:  Portable exam 1821 hours compared to 10/04/2018 FINDINGS: Tip of endotracheal tube 8.2 cm above carina. Nasogastric tube extends into abdomen. Enlargement of cardiac silhouette with pulmonary vascular congestion. Atherosclerotic calcification aorta. Perihilar infiltrates likely pulmonary edema. No pleural effusion or pneumothorax. IMPRESSION: Tube positions as above. BILATERAL pulmonary infiltrates question pulmonary edema. Electronically Signed   By: Lavonia Dana M.D.   On: 03/22/2019 18:56    Assessment and Plan:   Principal Problem:   Cardiac arrest [facility]) Active Problems:   Coronary artery disease involving native coronary artery of native heart with angina pectoris ([place])   Diabetes mellitus with peripheral vascular disease ([place])   Ventricular tachycardia ([place])   Acute on chronic combined systolic and diastolic CHF (congestive heart failure) ([facility])   Tobacco abuse   Acute respiratory failure with hypercapnia ([facility])   ACC/AHA stage B congestive heart failure due to ischemic cardiomyopathy ([place])   Peripheral vascular disease ([place])   Demand  ischemia ([place])  Difficult situation with a 62 year old gentleman with clear vasculopath history and coronary disease with CABG 16 years ago.  We need to get his cardiology records from Dr. Cindie Laroche and potentially even from [facility]. He did have an echocardiogram done last year and this shows significant drop in function from that time.  Is difficult to say if this is in response to or the cause of his current VT arrest.  However until proven otherwise would consider that it possibly is the cause of.  Although an ischemic etiology is possible, it does not appear that this was a myocardial infarction leading to arrhythmia.  Troponin elevation is modest, but not consistent with ACS.  For now, would continue supportive management with vasopressors.  Consider diuresis with acute heart failure exacerbation main possibility.  With flat minimal troponin trend, will probably not anticoagulate at this time.  Ideally, if he were to have signs of neurologic recovery, he would definitely probably need an ischemic evaluation with cardiac catheterization (needing to know his prior CABG report, cath report and echo report).  He would then possibly need an EP consultation if there is no evidence of obstructive CAD.  --We will follow along, but for now no active plans for ischemic evaluation until we see signs of neurologic coverage.     For questions or updates, please contact [place] Please consult [email or web address] for contact info under     Signed, Glenetta Hew, MD  03/31/2019 4:51 PM

## 2019-03-31 NOTE — Progress Notes (Signed)
ANTICOAGULATION CONSULT NOTE - Initial Consult  Pharmacy Consult for heparin Indication: chest pain/ACS  Allergies  Allergen Reactions  . Erythromycin Anaphylaxis and Rash  . Iohexol Anaphylaxis, Hives, Itching and Other (See Comments)    Note:  13 HR PRE-MED GIVEN AND SUCCESSFUL-ARS 07/15/07.     Marland Kitchen Penicillins Anaphylaxis, Itching and Other (See Comments)    Has patient had a PCN reaction causing immediate rash, facial/tongue/throat swelling, SOB or lightheadedness with hypotension: Yes Has patient had a PCN reaction causing severe rash involving mucus membranes or skin necrosis: No Has patient had a PCN reaction that required hospitalization No Has patient had a PCN reaction occurring within the last 10 years: No If all of the above answers are "NO", then may proceed with Cephalosporin use.    Patient Measurements: Height: 6' (182.9 cm) Weight: 216 lb 4.3 oz (98.1 kg) IBW/kg (Calculated) : 77.6 Heparin Dosing Weight: 97kg  Vital Signs: Temp: 99.3 F (37.4 C) (07/26 1400) Temp Source: Core (Comment) (07/26 1400) BP: 104/74 (07/26 1400) Pulse Rate: 89 (07/26 1400)  Labs: Recent Labs    03/27/2019 1800 03/22/2019 1838  03/31/19 0110 03/31/19 0224 03/31/19 0343 03/31/19 1037 03/31/19 1354  HGB 13.0  --    < > 11.6* 13.1 12.9*  --   --   HCT 40.5  --    < > 35.7* 40.0 38.0*  --   --   PLT 289  --   --  218 228  --   --   --   APTT  --   --   --  36  --   --   --   --   LABPROT  --   --   --  16.7*  --   --   --   --   INR  --   --   --  1.4*  --   --   --   --   HEPARINUNFRC  --   --   --   --   --   --   --  0.20*  CREATININE  --  1.55*  --  1.77* 1.80*  --   --   --   TROPONINIHS  --  44*   < > 76* 79*  --  86*  --    < > = values in this interval not displayed.    Estimated Creatinine Clearance: 42.4 mL/min (A) (by C-G formula based on SCr of 1.8 mg/dL (H)).   Medical History: Past Medical History:  Diagnosis Date  . Arthritis    "qwhere" (07/09/2013)  .  Asthma   . COPD (chronic obstructive pulmonary disease) ([place])    pt denies this hx on 07/09/2013; has chronic dyspnea  . Coronary artery disease    Reportedly multivessel disease-CABG  . Gout   . H/O hiatal hernia 1997  . H/O: rheumatic fever    "had rheumatic fever as a kid" (07/09/2013) -has murmur  . Hyperlipidemia with target LDL less than 70   . Hypertension   . Ischemic cardiomyopathy    By report,-ICD  . Myocardial infarction ([place]) 03/2002; ~ 2011  . Neuropathy   . PAD (peripheral artery disease) ([place]) 2014   Right SFA stent November 2014; status post left AKA  . Pneumonia    "once" (07/09/2013)  . Renal disorder    "only have 1 kidney; due to go back soon to check the other one" (07/09/2013)  . RUPTURE ROTATOR CUFF 12/03/2009   Qualifier:  Diagnosis of  By: Aline Brochure MD, Dorothyann Peng    . Type II diabetes mellitus ([place])   . 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 UTI (urinary tract infection) 10/04/2018     Assessment: 39 yoM with VT and concerns for ACS with rising troponins. CT head negative, no AC noted PTA.   Heparin level below goal this afternoon at 0.2. No bleeding issues noted, cbc stable overnight. Troponin slight trend up.    Goal of Therapy:  Heparin level 0.3-0.7 units/ml Monitor platelets by anticoagulation protocol: Yes   Plan:  Heparin 1300 units/hr Check heparin level in [place] PharmD., BCPS Clinical Pharmacist 03/31/2019 2:29 PM

## 2019-03-31 NOTE — Progress Notes (Signed)
eLink Physician-Brief Progress Note Patient Name: NIEKO CLARIN DOB: Sep 22, 1940 MRN: [id number]   Date of Service  03/31/2019  HPI/Events of Note  Notified of decreased pulse on RLE. Known history of PVD on heparin drip.  eICU Interventions  Heparin level being checked Stat US arterial doppler        Judd Lien 03/31/2019, 11:53 PM

## 2019-03-31 NOTE — Progress Notes (Signed)
ANTICOAGULATION CONSULT NOTE - [facility] for heparin Indication: chest pain/ACS  Allergies  Allergen Reactions  . Erythromycin Anaphylaxis and Rash  . Iohexol Anaphylaxis, Hives, Itching and Other (See Comments)    Note:  13 HR PRE-MED GIVEN AND SUCCESSFUL-ARS 07/15/07.     Marland Kitchen Penicillins Anaphylaxis, Itching and Other (See Comments)    Has patient had a PCN reaction causing immediate rash, facial/tongue/throat swelling, SOB or lightheadedness with hypotension: Yes Has patient had a PCN reaction causing severe rash involving mucus membranes or skin necrosis: No Has patient had a PCN reaction that required hospitalization No Has patient had a PCN reaction occurring within the last 10 years: No If all of the above answers are "NO", then may proceed with Cephalosporin use.    Patient Measurements: Height: 6' (182.9 cm) Weight: 216 lb 4.3 oz (98.1 kg) IBW/kg (Calculated) : 77.6 Heparin Dosing Weight: 97kg  Vital Signs: Temp: 100 F (37.8 C) (07/26 2330) Temp Source: Bladder (07/26 1800) BP: 106/76 (07/26 2330) Pulse Rate: 84 (07/26 2330)  Labs: Recent Labs    03/24/2019 1800  03/31/19 0110 03/31/19 0224 03/31/19 0343 03/31/19 1037 03/31/19 1354 03/31/19 1720 03/31/19 2300  HGB 13.0   < > 11.6* 13.1 12.9*  --   --   --   --   HCT 40.5   < > 35.7* 40.0 38.0*  --   --   --   --   PLT 289  --  218 228  --   --   --   --   --   APTT  --   --  36  --   --   --   --   --   --   LABPROT  --   --  16.7*  --   --   --   --   --   --   INR  --   --  1.4*  --   --   --   --   --   --   HEPARINUNFRC  --   --   --   --   --   --  0.20*  --  0.21*  CREATININE  --    < > 1.77* 1.80*  --   --   --  2.16*  --   TROPONINIHS  --    < > 76* 79*  --  86*  --  89*  --    < > = values in this interval not displayed.    Estimated Creatinine Clearance: 35.3 mL/min (A) (by C-G formula based on SCr of 2.16 mg/dL (H)).   Medical History: Past Medical History:   Diagnosis Date  . Arthritis    "qwhere" (07/09/2013)  . Asthma   . COPD (chronic obstructive pulmonary disease) ([place])    pt denies this hx on 07/09/2013; has chronic dyspnea  . Coronary artery disease    Reportedly multivessel disease-CABG  . Gout   . H/O hiatal hernia 1997  . H/O: rheumatic fever    "had rheumatic fever as a kid" (07/09/2013) -has murmur  . Hyperlipidemia with target LDL less than 70   . Hypertension   . Ischemic cardiomyopathy    By report,-ICD  . Myocardial infarction ([place]) 03/2002; ~ 2011  . Neuropathy   . PAD (peripheral artery disease) ([place]) 2014   Right SFA stent November 2014; status post left AKA  . Pneumonia    "once" (07/09/2013)  . Renal disorder    "  only have 1 kidney; due to go back soon to check the other one" (07/09/2013)  . RUPTURE ROTATOR CUFF 12/03/2009   Qualifier: Diagnosis of  By: Aline Brochure MD, Dorothyann Peng    . Type II diabetes mellitus ([place])   . 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 UTI (urinary tract infection) 10/04/2018     Assessment: 45 yoM with VT and concerns for ACS with rising troponins. CT head negative, no AC noted PTA.   Heparin level below remains subtherapeutic at 0.21. No issues with infusion per nursing.  Goal of Therapy:  Heparin level 0.3-0.7 units/ml Monitor platelets by anticoagulation protocol: Yes   Plan:  Increase heparin to 1550 units/hr Recheck heparin level in [place], PharmD, BCPS Clinical Pharmacist Please check AMION for all [facility] numbers 03/31/2019

## 2019-03-31 NOTE — Progress Notes (Signed)
  Echocardiogram 2D Echocardiogram has been performed.  [name] 03/31/2019, 9:52 AM

## 2019-03-31 NOTE — Progress Notes (Signed)
Pt bladder scan 55 mL urine. RN will continue to monitor.

## 2019-03-31 NOTE — Progress Notes (Signed)
NAME:  [name], MRN:  [id number], DOB:  September 04, 1941, LOS: 1 ADMISSION DATE:  03/12/2019, CONSULTATION DATE:  03/14/2019 REFERRING MD:  Forestine Na EM (Dr. Sabra Heck), CHIEF COMPLAINT:  S/p VT and 1x cardioversion  Brief History   78 yo M s/p VT arrest s/p cardioversion x 1 by EMS, unclear if patient was pulseless or had pulse. Intubated at [facility] ED and transferred to [facility]  History of present illness   History obtained per chart review  78 yo M PMH COPD, HTN, CAD with prior MI and s/p CABG 5188, ICM, systolic heart failure, DM2, CKD III (baseline Cr 1.3), hx ureter cancer s/p L nephrectomy, PVD s/p L AKA who presented to [facility] ED 7/25 s/p VT with 1x cardioversion and subsequent conversion to narrow complex tachycardic rhythm. It is unclear if patient lost pulse and ROSC achieved, or if patient retained pulse and was cardioverted x1.  On 7/25 the patient had reportedly "not been feeling well this morning," and EMS was dispatched to patient's home for AMS, altered level of consciousness. Patient has reportedly been increasingly SOB x several days and has been using BDs with increasing frequency. Patient reportedly also with severe lower extremity swelling, extending to groin. Patient reportedly unresponsive on Ems arrival, noted to be in [place]. Unclear if pulse vs pulseless VT. Unclear if cardioversion vs Defib x1 at 120J, with subsequent narrow complex rhythm. Patient required transient BVM ventilation but was reportedly breathing spontaneously. No CPR was required, patient received no other pre-hospital medications.  Upon arrival to [facility] ED, patient was intubated for unresponsiveness. A CVC was placed. Patient given amiodarone and is being transferred to [facility].   SARS-CoV2 Negative 03/06/2019  Past Medical History  COPD DM2 CAD, MI, s/p CABG  Systolic heart failure ICM HTN HLD EtOH abuse FTT PVD  Ureter Cancer s/p L nephrectomy  CKD III   Significant Hospital Events    7/25 s/p VT arrest with ROSC after 1x cardioversion 120J. Intubated at [facility], CVC placed. Transferring to [facility] for ICU admission   Consults:    Procedures:  7/25 ETT>> 7/25 CVC >>   Significant Diagnostic Tests:  CXR 7/25> ETT and CVC in adequate position. No pneumothorax. Cardiomegaly. Bilateral pulmonary infiltrates vs edema   CT Head 7/25 > NAICA CT Abdomen/Pelvis 7/25 - Bilateral pleural effusions, bibasilar airspace opacities, cardiomegaly, anasarca, absent left kidney  Micro Data:  7/25 SARS CoV2 > negative  7/26 BCX > 7/26 RCX >   Antimicrobials:  7/25 Rocephin>>   Interim history/subjective:  On minimal vent settings. Very agitated this morning, fentanyl was increased to 250 mcg/hr. Per RN on WUA, purposeful movements, however not following commands, moves extremities x 4  Objective   Blood pressure (!) 86/64, pulse (!) 118, temperature 98.2 F (36.8 C), resp. rate 19, height 6' (1.829 m), weight 98.1 kg, SpO2 96 %.    Vent Mode: PRVC FiO2 (%):  [40 %-60 %] 40 % Set Rate:  [18 bmp] 18 bmp Vt Set:  [510 mL-620 mL] 510 mL PEEP:  [5 cmH20] 5 cmH20 Plateau Pressure:  [11 cmH20-16 cmH20] 16 cmH20   Intake/Output Summary (Last 24 hours) at 03/31/2019 0956 Last data filed at 03/31/2019 0900 Gross per 24 hour  Intake 840 ml  Output 865 ml  Net -25 ml   Filed Weights   03/24/2019 1752 03/31/19 0000 03/31/19 0348  Weight: 104.3 kg 98 kg 98.1 kg   Physical Exam: General: Chronically ill-appearing, sedated HENT: [place],  AT, ETT in place Eyes: EOMI, no scleral icterus Respiratory: Anterior rhonchi bilaterally Cardiovascular: RRR, -M/R/G, no JVD GI: BS+, soft, nontender Extremities:3+ pitting edema, RLE erythema, L AKA-tenderness Neuro: Sedated GU: Foley in place  Resolved Hospital Problem list     Assessment & Plan:   S/p VT arrest with cardioversion, presumed pulseless VT EMS performed CV at 1 J with resultant narrow complex tachydysrhythmia   ?pulseless VT vs VT with pulse  No TTM as patient with purposeful movements post-arrest Plan Telemetry Consult Cardiology Continue amiodarone Continue Heparin gtt F/u echocardiogram Trend troponin until peaked 44>56>79  Acute on chronic respiratory failure requiring intubation 2/2 pulmonary edema, possible aspiration during arrest COPD  Plan Full vent support Continue Rocephin for suspected aspiration pna F/u blood and trach aspirate PAD protocol for goal RASS 0 and -1: Start precedex and wean fentanyl VAP  Chronic systolic heart failure (last EF 45-50% in 2018) NICM/CAD/HTN Plan Management as above Diuresis as tolerated  AoCKD III Hx Ureter cancer s/p L nephrectomy L abdominal protuberance  Baseline Cr approx 1.3. risk for AKI in setting of cardiac arrest Plan Strict I/O Trend UOP/BMP  PVD  RLE edema, concern for non-purulent cellulitis  -negative DVT studies 02/2019 -Has been on doxy for possible cellulitis Plan Rocephin as above  Midline Sacral Wound -Followed by wound care P WOCN consult for inpatient recs   DM2 Plan SSI  Best practice:  Diet: NPO  Pain/Anxiety/Delirium protocol (if indicated): Fent, Precedex VAP protocol (if indicated): yes  DVT prophylaxis: SCD  GI prophylaxis: Pepcid Glucose control: SSI Mobility: BR  Code Status: Full  Family Communication: Updated wife on 7/26 Disposition: ICU  Labs   CBC: Recent Labs  Lab 03/28/2019 1800 04/05/2019 2338 03/31/19 0110 03/31/19 0224 03/31/19 0343  WBC 8.0  --  6.1 6.6  --   NEUTROABS 5.6  --  4.7  --   --   HGB 13.0 13.9 11.6* 13.1 12.9*  HCT 40.5 41.0 35.7* 40.0 38.0*  MCV 91.2  --  91.1 91.1  --   PLT 289  --  218 228  --     Basic Metabolic Panel: Recent Labs  Lab 04/01/2019 1838 03/23/2019 2338 03/31/19 0110 03/31/19 0224 03/31/19 0343  NA 135 136 136 136 135  K 4.6 4.7 4.5 4.7 4.4  CL 105  --  102 102  --   CO2 20*  --  23 21*  --   GLUCOSE 169*  --  145* 128*  --   BUN  31*  --  30* 29*  --   CREATININE 1.55*  --  1.77* 1.80*  --   CALCIUM 8.3*  --  8.4* 8.7*  --   MG 2.3  --  2.1 2.1  --   PHOS  --   --  4.1 3.9  --    GFR: Estimated Creatinine Clearance: 42.4 mL/min (A) (by C-G formula based on SCr of 1.8 mg/dL (H)). Recent Labs  Lab 03/28/2019 1800 03/31/19 0110 03/31/19 0224  WBC 8.0 6.1 6.6    Liver Function Tests: Recent Labs  Lab 03/26/2019 1838 03/31/19 0110  AST 24 19  ALT 19 17  ALKPHOS 57 59  BILITOT 0.8 0.6  PROT 5.7* 5.4*  ALBUMIN 2.9* 2.8*   No results for input(s): LIPASE, AMYLASE in the last 168 hours. No results for input(s): AMMONIA in the last 168 hours.  ABG    Component Value Date/Time   PHART 7.443 03/31/2019 0343   PCO2ART 32.7  03/31/2019 0343   PO2ART 71.0 (L) 03/31/2019 0343   HCO3 22.6 03/31/2019 0343   TCO2 24 03/31/2019 0343   ACIDBASEDEF 1.0 03/31/2019 0343   O2SAT 96.0 03/31/2019 0343     Coagulation Profile: Recent Labs  Lab 03/31/19 0110  INR 1.4*    Cardiac Enzymes: No results for input(s): CKTOTAL, CKMB, CKMBINDEX, TROPONINI in the last 168 hours.  HbA1C: Hgb A1c MFr Bld  Date/Time Value Ref Range Status  05/24/2017 07:04 PM 6.8 (H) 4.8 - 5.6 % Final    Comment:    (NOTE) Pre diabetes:          5.7%-6.4% Diabetes:              >6.4% Glycemic control for   <7.0% adults with diabetes   06/29/2016 08:50 AM 6.2 (H) 4.8 - 5.6 % Final    Comment:    (NOTE)         Pre-diabetes: 5.7 - 6.4         Diabetes: >6.4         Glycemic control for adults with diabetes: <7.0     CBG: Recent Labs  Lab 03/17/2019 1817 04/04/2019 2309 03/31/19 0310 03/31/19 0821 03/31/19 0935  GLUCAP 169* 178* 127* 65* 113*    Critical care time: 40 min    The patient is critically ill with multiple organ systems failure and requires high complexity decision making for assessment and support, frequent evaluation and titration of therapies, application of advanced monitoring technologies and extensive  interpretation of multiple databases.   Critical Care Time devoted to patient care services described in this note is 40 Minutes. This time reflects time of care of this signee Dr. Rodman Pickle. This critical care time does not reflect procedure time, or teaching time or supervisory time of PA/NP/Med student/Med Resident etc but could involve care discussion time.  Rodman Pickle, M.D. [facility] Pulmonary/Critical Care Medicine Pager: [phone] After hours pager: [phone]

## 2019-04-01 ENCOUNTER — Inpatient Hospital Stay (HOSPITAL_COMMUNITY): Payer: Medicare HMO

## 2019-04-01 DIAGNOSIS — I998 Other disorder of circulatory system: Secondary | ICD-10-CM

## 2019-04-01 DIAGNOSIS — J449 Chronic obstructive pulmonary disease, unspecified: Secondary | ICD-10-CM

## 2019-04-01 DIAGNOSIS — N183 Chronic kidney disease, stage 3 (moderate): Secondary | ICD-10-CM

## 2019-04-01 DIAGNOSIS — I2510 Atherosclerotic heart disease of native coronary artery without angina pectoris: Secondary | ICD-10-CM

## 2019-04-01 LAB — MAGNESIUM: Magnesium: 2 mg/dL (ref 1.7–2.4)

## 2019-04-01 LAB — BASIC METABOLIC PANEL
Anion gap: 10 (ref 5–15)
BUN: 36 mg/dL — ABNORMAL HIGH (ref 8–23)
CO2: 23 mmol/L (ref 22–32)
Calcium: 8 mg/dL — ABNORMAL LOW (ref 8.9–10.3)
Chloride: 103 mmol/L (ref 98–111)
Creatinine, Ser: 2.42 mg/dL — ABNORMAL HIGH (ref 0.61–1.24)
GFR calc Af Amer: 29 mL/min — ABNORMAL LOW (ref >60)
GFR calc non Af Amer: 25 mL/min — ABNORMAL LOW (ref >60)
Glucose, Bld: 90 mg/dL (ref 70–99)
Potassium: 4.5 mmol/L (ref 3.5–5.1)
Sodium: 136 mmol/L (ref 135–145)

## 2019-04-01 LAB — HEPARIN LEVEL (UNFRACTIONATED): Heparin Unfractionated: 0.48 IU/mL (ref 0.30–0.70)

## 2019-04-01 LAB — CBC
HCT: 39 % (ref 39.0–52.0)
Hemoglobin: 12.2 g/dL — ABNORMAL LOW (ref 13.0–17.0)
MCH: 29.3 pg (ref 26.0–34.0)
MCHC: 31.3 g/dL (ref 30.0–36.0)
MCV: 93.5 fL (ref 80.0–100.0)
Platelets: 220 10*3/uL (ref 150–400)
RBC: 4.17 MIL/uL — ABNORMAL LOW (ref 4.22–5.81)
RDW: 17 % — ABNORMAL HIGH (ref 11.5–15.5)
WBC: 7.8 10*3/uL (ref 4.0–10.5)
nRBC: 0 % (ref 0.0–0.2)

## 2019-04-01 LAB — TROPONIN I (HIGH SENSITIVITY): Troponin I (High Sensitivity): 88 ng/L — ABNORMAL HIGH (ref <18)

## 2019-04-01 LAB — GLUCOSE, CAPILLARY
Glucose-Capillary: 57 mg/dL — ABNORMAL LOW (ref 70–99)
Glucose-Capillary: 69 mg/dL — ABNORMAL LOW (ref 70–99)
Glucose-Capillary: 74 mg/dL (ref 70–99)
Glucose-Capillary: 81 mg/dL (ref 70–99)
Glucose-Capillary: 82 mg/dL (ref 70–99)
Glucose-Capillary: 84 mg/dL (ref 70–99)
Glucose-Capillary: 86 mg/dL (ref 70–99)
Glucose-Capillary: 90 mg/dL (ref 70–99)
Glucose-Capillary: 91 mg/dL (ref 70–99)
Glucose-Capillary: 96 mg/dL (ref 70–99)

## 2019-04-01 LAB — PROTIME-INR
INR: 1.4 — ABNORMAL HIGH (ref 0.8–1.2)
Prothrombin Time: 16.7 seconds — ABNORMAL HIGH (ref 11.4–15.2)

## 2019-04-01 MED ORDER — DEXTROSE 50 % IV SOLN
INTRAVENOUS | Status: AC
  Administered 2019-04-01: 25 mL
  Filled 2019-04-01: qty 50

## 2019-04-01 MED ORDER — PNEUMOCOCCAL VAC POLYVALENT 25 MCG/0.5ML IJ INJ
0.5000 mL | INJECTION | INTRAMUSCULAR | Status: AC
  Administered 2019-04-02: 10:00:00 0.5 mL via INTRAMUSCULAR
  Filled 2019-04-01: qty 0.5

## 2019-04-01 MED FILL — Medication: Qty: 1 | Status: AC

## 2019-04-01 NOTE — Progress Notes (Signed)
RN notified E Link of absent right pedal pulse by doppler and mottled foot  See new orders

## 2019-04-01 NOTE — Progress Notes (Signed)
Initial Nutrition Assessment  INTERVENTION:   If patient expected to remain intubated for 24-48 hours, recommend: Initiate Vital AF 1.2 @ 60 ml/hr with 30 ml Prostat BID iva OGT. This regimen would provide 1928 kcal, 138g protein and 1167 ml H2O.  NUTRITION DIAGNOSIS:   Inadequate oral intake related to inability to eat as evidenced by NPO status.  GOAL:   Patient will meet greater than or equal to 90% of their needs  MONITOR:   Vent status, Labs, Weight trends, I & O's, Skin  REASON FOR ASSESSMENT:   Ventilator    ASSESSMENT:   78 yo M PMH COPD, HTN, CAD with prior MI and s/p CABG 1552, ICM, systolic heart failure, DM2, CKD III (baseline Cr 1.3), hx ureter cancer s/p L nephrectomy, PVD s/p L AKA who presented to [facility] ED 7/25 s/p VT with 1x cardioversion and subsequent conversion to narrow complex tachycardic rhythm.  **RD working remotely**  Patient is currently intubated on ventilator support MV: 9.6 L/min Temp (24hrs), Avg:100 F (37.8 C), Min:99.7 F (37.6 C), Max:100.9 F (38.3 C)  Pt with history of L AKA in 2011. Per cardiology note, pt may need R AKA given ischemic cold right foot with non-retrievable capillary bed.  No longer on pressors.   Per weight records, pt's weight has decreased since 7/26, now 211 lbs. Overall, pt's weight has increased since September 2019 (weighed 169 lbs then).   Per I/Os: +557 ml since admit.  UOP on 7/26: 940 ml  Medications: Fentanyl infusion Labs reviewed: CBGs: 90-91 GFR: 25  NUTRITION - FOCUSED PHYSICAL EXAM:  Unable to perform -working remotely.  Diet Order:   Diet Order            Diet NPO time specified  Diet effective now              EDUCATION NEEDS:   Not appropriate for education at this time  Skin:  Skin Assessment: Skin Integrity Issues: Skin Integrity Issues:: Stage II Stage II: coccyx  Last BM:  PTA  Height:   Ht Readings from Last 1 Encounters:  03/31/19 6' (1.829 m)     Weight:   Wt Readings from Last 1 Encounters:  04/01/19 95.9 kg    Ideal Body Weight:  72.8 kg(adjusted for Lt AKA)  BMI:  Body mass index is 28.67 kg/m.  Adjusted BMI for amputation: 31.5 kg/m^2  Estimated Nutritional Needs:   Kcal:  2144  Protein:  140-150g  Fluid:  2L/day   Clayton Bibles, MS, RD, LDN [facility] Clinical Dietitian Pager: [phone] After Hours Pager: [phone]

## 2019-04-01 NOTE — Consult Note (Addendum)
Cardiology Consultation:   Patient ID: [name] MRN: [id number]; DOB: 02-26-1941  Admit date: 03/11/2019 Date of Consult: 04/01/2019  Primary Care Provider: Lucia Gaskins, MD Primary Cardiologist: new to [facility], Dr. Ellyn Chambers Primary Electrophysiologist:  None    Patient Profile:   [name] is a 78 y.o. male with a hx of CAD (CABG, per family CABG x5 was in 2004 -[place] -by report, has had at least one catheterization prior to his AKA), ICM, chronic CHf (combined), COPD, PVD, (right SFA stent, left AKA), HTN, HLD, IDDM, CKD (s/p L nephrectomy)  with no stents who is being seen today for the evaluation for consideration of ICD post cardiac arrest at the request of Dr. Ellyn Chambers.  History of Present Illness:   [name] was admitted to [facility] 03/27/2019 evening s/p OOH cardiac arrest  in review of record: Reportedly had been feeling unwell prior to his arrest .  EMS was called for altered mental status altered level consciousness.  Apparently he been noticing increasing dyspnea over the prior several days reportedly using bronchodilators more frequently.  Also there is report  He noted more swelling extending up to the groin.  He has been noticing orthopnea, but not necessarily any PND.  Has had been using his nebulizers more. And having edema more so than usual in his right leg.  Per EMS report was unresponsive that upon their arrival and noted to be in ventricular tachycardia (but was not clear if the patient was actually pulseless or did have pulse).  Regardless was treated with 1 shock 120J resulting in narrow complex rhythm.  After transient bag-valve-mask the patient was breathing spontaneously.  He was intubated upon arrival to the ER,for what appeared to be acute pulmonary edema and hypoxic/hypercapnic respiratory failure. started on antibiotics and amiodarone.  In my review of EMS record Initial vitals  231 and 229BPM,  65/39 Shocked to a narrative of ST 116bpm (no documented pressure), initially remained unresponsive with resp only of 4-6, 2 failed attempts at intubation > OPA w/BVM, unable to get BP  Strip #1  is MMVT 220's (no EKG) #2 is small, difficult to see, likely ST120   LABS K+ 4.6 >>> 4.5 Mag 2.3 >>> 2.0 BUN/Creat 31/1.55 >>> 36/2.42 (baseline looks about 1.5-1.8) HS Trop 44 > 56 > 76 > 79 > 86 > 89 > 88 BNP 483 WBC 8.0 >> 7.8 H/H 13/40 >> 12/39 Plts 289  COVID negative  Early this AM vascular surgery was consulted urgently for ischemic RLE.  The patient known to their service with chronic RLE ischemic disease, no acutely/profoundly ischemic R foot, suspect worsened with acute medical issues and pressor use (levo stopped overnight).  Limb not felt to be surgical with low chance of success, also not pursuing CT angio with worsening renal function, and foot not felt to be salvageable.  If medical stable and ischemia progresses, will need R AKA.  Heart Pathway Score:     Past Medical History:  Diagnosis Date   Arthritis    "qwhere" (07/09/2013)   Asthma    COPD (chronic obstructive pulmonary disease) ([place])    pt denies this hx on 07/09/2013; has chronic dyspnea   Coronary artery disease    Reportedly multivessel disease-CABG   Gout    H/O hiatal hernia 1997   H/O: rheumatic fever    "had rheumatic fever as a kid" (07/09/2013) -has murmur   Hyperlipidemia with target LDL less than 70    Hypertension  Ischemic cardiomyopathy    By report,-ICD   Myocardial infarction ([place]) 03/2002; ~ 2011   Neuropathy    PAD (peripheral artery disease) ([place]) 2014   Right SFA stent November 2014; status post left AKA   Pneumonia    "once" (07/09/2013)   Renal disorder    "only have 1 kidney; due to go back soon to check the other one" (07/09/2013)   RUPTURE ROTATOR CUFF 12/03/2009   Qualifier: Diagnosis of  By: Aline Brochure MD, [place]     Type II diabetes mellitus ([place])    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 4, 2014   LOWER EXTREMITY ANGIOGRAM Right 07/09/2013   Procedure: LOWER EXTREMITY ANGIOGRAM;  Surgeon: Serafina Mitchell, MD;  Location: [facility] CATH LAB;  Service: Cardiovascular;  Laterality: Right;   NEPHRECTOMY Left 12/19/2001   PERIPHERAL VASCULAR CATHETERIZATION N/A 02/03/2015   Procedure: Abdominal Aortogram;  Surgeon: Serafina Mitchell, MD;  Location: [facility] CV LAB;  Service: Cardiovascular;  Laterality: N/A;   TRIGGER FINGER RELEASE Left ~ 1990's     Home Medications:  Prior to Admission medications   Medication Sig Start Date End Date Taking? Authorizing Provider  albuterol (PROVENTIL HFA;VENTOLIN HFA) 108 (90 Base) MCG/ACT inhaler Inhale 2 puffs into the lungs every 4 (four) hours as needed for wheezing or shortness of breath.   Yes [provider]  cholecalciferol (VITAMIN D) 400 units TABS tablet Take 1,200 Units by mouth 2 (two) times daily.   Yes [provider]  metFORMIN (GLUCOPHAGE) 500 MG tablet Take 500 mg by mouth 2 (two) times daily with a meal.   Yes [provider]  mometasone-formoterol (DULERA) 200-5 MCG/ACT AERO Inhale 2 puffs into the lungs daily as needed for wheezing or shortness of breath. 10/05/18  Yes Fuller Plan A, MD  QUEtiapine (SEROQUEL) 300 MG tablet Take 300 mg by mouth at bedtime. 09/10/18  Yes [provider]   Tamsulosin HCl (FLOMAX) 0.4 MG CAPS Take 0.4 mg by mouth 2 (two) times daily.    Yes [provider]  triazolam (HALCION) 0.25 MG tablet Take 0.5 mg by mouth at bedtime as needed for sleep.    Yes [provider]  albuterol (PROVENTIL) (2.5 MG/3ML) 0.083% nebulizer solution Take 2.5 mg by nebulization every 6 (six) hours as needed for wheezing or shortness of breath.    [provider]  aspirin EC 81 MG tablet Take 81 mg by mouth at bedtime.    [provider]  doxycycline (VIBRAMYCIN) 100 MG capsule Take 1 capsule (100 mg total) by mouth 2 (two) times daily. 02/15/19   Nickel, Sharmon Leyden, NP  DULoxetine (CYMBALTA) 30 MG capsule Take 1 capsule (30 mg total) by mouth daily. 05/27/17 06/26/17  Elodia Florence., MD  gabapentin (NEURONTIN) 300 MG capsule Take 1 capsule (300 mg total) by mouth 2 (two) times  daily. Pt takes two capsules in the morning and three at bedtime. Patient taking differently: Take 600-900 mg by mouth See admin instructions. Pt takes two capsules in the morning and three at bedtime. 07/02/16   Verlee Monte, MD  Gauze Pads & Dressings (FOAM DRESSING) PADS 1 patch by Does not apply route daily. 05/26/17   Elodia Florence., MD  Insulin Glargine (TOUJEO SOLOSTAR) 300 UNIT/ML SOPN Inject 5 Units into the skin daily as needed (for BS greater than 200).     [provider]  nicotine (NICODERM CQ - DOSED IN MG/24 HOURS) 21 mg/24hr patch Place 1 patch (21 mg total) onto the skin daily. 05/27/17   Elodia Florence., MD  nitroGLYCERIN (NITROSTAT) 0.4 MG SL tablet Place 0.4 mg under the tongue every 5 (five) minutes as needed for chest pain.    [provider]  Skin Protectants, Misc. [facility] SKIN PROTECTANT) 50 % OINT Apply to ulcer generously 2-3 times a day, especially following bowel movement 03/08/19   Stacey Drain [place], [place]    Inpatient Medications: Scheduled Meds:  aspirin  81 mg Per Tube Daily   chlorhexidine  gluconate (MEDLINE KIT)  15 mL Mouth Rinse BID   Chlorhexidine Gluconate Cloth  6 each Topical Daily   famotidine  20 mg Per Tube QHS   insulin aspart  0-15 Units Subcutaneous Q4H   mouth rinse  15 mL Mouth Rinse 10 times per day   sodium chloride flush  10-40 mL Intracatheter Q12H   Continuous Infusions:  sodium chloride 10 mL/hr at 04/01/19 0800   amiodarone 30 mg/hr (04/01/19 0800)   cefTRIAXone (ROCEPHIN)  IV Stopped (03/31/19 2124)   dexmedetomidine Stopped (03/31/19 1147)   fentaNYL infusion INTRAVENOUS 150 mcg/hr (04/01/19 0800)   heparin 1,550 Units/hr (04/01/19 0800)   norepinephrine (LEVOPHED) Adult infusion Stopped (04/01/19 0035)   propofol (DIPRIVAN) infusion Stopped (03/31/19 0446)   PRN Meds: acetaminophen (TYLENOL) oral liquid 160 mg/5 mL, fentaNYL (SUBLIMAZE) injection, fentaNYL (SUBLIMAZE) injection, sodium chloride flush  Allergies:    Allergies  Allergen Reactions   Erythromycin Anaphylaxis and Rash   Iohexol Anaphylaxis, Hives, Itching and Other (See Comments)    Note:  13 HR PRE-MED GIVEN AND SUCCESSFUL-ARS 07/15/07.      Penicillins Anaphylaxis, Itching and Other (See Comments)    Has patient had a PCN reaction causing immediate rash, facial/tongue/throat swelling, SOB or lightheadedness with hypotension: Yes Has patient had a PCN reaction causing severe rash involving mucus membranes or skin necrosis: No Has patient had a PCN reaction that required hospitalization No Has patient had a PCN reaction occurring within the last 10 years: No If all of the above answers are "NO", then may proceed with Cephalosporin use.    Social History:   Social History   Socioeconomic History   Marital status: Married    Spouse name: Not on file   Number of children: Not on file   Years of education: Not on file   Highest education level: Not on file  Occupational History   Not on file  Social Needs   Financial resource strain: Not on file    Food insecurity    Worry: Not on file    Inability: Not on file   Transportation needs    Medical: Not on file    Non-medical: Not on file  Tobacco Use   Smoking status: Current Every Day Smoker    Packs/day: 1.50    Years: 54.00    Pack years: 81.00  Types: Cigarettes   Smokeless tobacco: Never Used  Substance and Sexual Activity   Alcohol use: No   Drug use: No   Sexual activity: Not Currently  Lifestyle   Physical activity    Days per week: Not on file    Minutes per session: Not on file   Stress: Not on file  Relationships   Social connections    Talks on phone: Not on file    Gets together: Not on file    Attends religious service: Not on file    Active member of club or organization: Not on file    Attends meetings of clubs or organizations: Not on file    Relationship status: Not on file   Intimate partner violence    Fear of current or ex partner: Not on file    Emotionally abused: Not on file    Physically abused: Not on file    Forced sexual activity: Not on file  Other Topics Concern   Not on file  Social History Narrative   Not on file    Family History:   Family History  Problem Relation Age of Onset   Cancer Mother    Heart attack Father      ROS:  Please see the history of present illness.  All other ROS reviewed and negative.     Physical Exam/Data:   Vitals:   04/01/19 0700 04/01/19 0730 04/01/19 0800 04/01/19 0809  BP: 115/71 119/76 114/68   Pulse: 93 94 90 97  Resp: 18 18 16 18   Temp: 99.7 F (37.6 C) 99.7 F (37.6 C) 99.7 F (37.6 C)   TempSrc:  Bladder Bladder   SpO2: 99% 94% 96% 95%  Weight:      Height:        Intake/Output Summary (Last 24 hours) at 04/01/2019 0823 Last data filed at 04/01/2019 0800 Gross per 24 hour  Intake 1604.27 ml  Output 1300 ml  Net 304.27 ml   Last 3 Weights 04/01/2019 03/31/2019 03/31/2019  Weight (lbs) 211 lb 6.7 oz 216 lb 4.3 oz 216 lb 0.8 oz  Weight (kg) 95.9 kg 98.1 kg 98 kg      Body mass index is 28.67 kg/m.  General:  Chronically ill appearing, intubated, on sedation though wakes HEENT:  Lymph: not assessed Neck: no JVD Endocrine:  No thryomegaly Vascular: No carotid bruits Cardiac:  RRR, no murmurs, no gallops or rubs appreciated Lungs:  Remains intubated, scatterred rhonchi b/l  Abd: soft, nontender Ext: no edema Musculoskeletal:  Left AKA, R foot is purple/blue and cold to mid foot Skin: warm and dry  Neuro:  Wakes to verbal and touch, not following commands consistently in d/w RN Psych:  Unable to assess  EKG:  The EKG was personally reviewed and demonstrates:    #1 ST 152bpm, no ischemic looking changes, IVCD, poor ant R progression #2 SR 97bpm, nonspecific IVCD, no ST/T changes with some baseline motion #3 SR 73 1st degree AVblock, 249m, nonspecific  IVCD, and ST/T changes  Telemetry:  Telemetry was personally reviewed and demonstrates:     Relevant CV Studies:   03/31/2019: TTE IMPRESSIONS  1. The left ventricle has severely reduced systolic function, with an ejection fraction of 25-30%. The cavity size was normal. Indeterminate diastolic filling due to E-A fusion.  2. LVEF is severely depressed; proximal portions of LV move the best but are not normal.  3. The right ventricle has normal systolic function. The cavity was mildly enlarged. There  is no increase in right ventricular wall thickness.  4. Left atrial size was mildly dilated.  5. Right atrial size was moderately dilated.  6. The mitral valve is grossly normal. There is mild mitral annular calcification present. Mitral valve regurgitation is mild to moderate by color flow Doppler.  7. Tricuspid valve regurgitation is moderate.  8. The inferior vena cava was dilated in size with <50% respiratory variability.  9. The left ventricular function has worsened.  FINDINGS  Left Ventricle: The left ventricle has severely reduced systolic function, with an ejection fraction of 25-30%.  The cavity size was normal. There is no increase in left ventricular wall thickness. Indeterminate diastolic filling due to E-A fusion. LVEF  is severely depressed; proximal portions of LV move the best but are not normal.  Right Ventricle: The right ventricle has normal systolic function. The cavity was mildly enlarged. There is no increase in right ventricular wall thickness.  Left Atrium: Left atrial size was mildly dilated.  Right Atrium: Right atrial size was moderately dilated. Right atrial pressure is estimated at 8 mmHg.  Interatrial Septum: No atrial level shunt detected by color flow Doppler.  Pericardium: There is no evidence of pericardial effusion.  Mitral Valve: The mitral valve is grossly normal. There is mild mitral annular calcification present. Mitral valve regurgitation is mild to moderate by color flow Doppler.  Tricuspid Valve: The tricuspid valve is normal in structure. Tricuspid valve regurgitation is moderate by color flow Doppler.  Aortic Valve: The aortic valve is tricuspid Mild thickening of the aortic valve. Mild calcification of the aortic valve. Aortic valve regurgitation was not visualized by color flow Doppler.  Pulmonic Valve: The pulmonic valve was not well visualized. Pulmonic valve regurgitation is not visualized by color flow Doppler.  Aorta: The aortic root is normal in size and structure. The aorta is normal in size and structure.  Venous: The inferior vena cava was not well visualized. The inferior vena cava is dilated in size with less than 50% respiratory variability.  Compared to previous exam: The left ventricular function is worsened.    05/25/17: TTE Study Conclusions - Procedure narrative: Even with Definity very poor images. - Left ventricle: The cavity size was normal. Wall thickness was   increased in a pattern of mild LVH. Systolic function was mildly   reduced. The estimated ejection fraction was in the range of 45%    to 50%. Images were inadequate for LV wall motion assessment. - Left atrium: The atrium was mildly dilated  Laboratory Data:  High Sensitivity Troponin:   Recent Labs  Lab 03/31/19 0110 03/31/19 0224 03/31/19 1037 03/31/19 1720 03/31/19 2300  TROPONINIHS 76* 79* 86* 89* 88*     Cardiac EnzymesNo results for input(s): TROPONINI in the last 168 hours. No results for input(s): TROPIPOC in the last 168 hours.  Chemistry Recent Labs  Lab 03/31/19 0224 03/31/19 0343 03/31/19 1720 04/01/19 0400  NA 136 135 137 136  K 4.7 4.4 5.0 4.5  CL 102  --  104 103  CO2 21*  --  23 23  GLUCOSE 128*  --  101* 90  BUN 29*  --  33* 36*  CREATININE 1.80*  --  2.16* 2.42*  CALCIUM 8.7*  --  8.3* 8.0*  GFRNONAA 36*  --  29* 25*  GFRAA 41*  --  33* 29*  ANIONGAP 13  --  10 10    Recent Labs  Lab 03/27/2019 1838 03/31/19 0110  PROT 5.7* 5.4*  ALBUMIN 2.9* 2.8*  AST 24 19  ALT 19 17  ALKPHOS 57 59  BILITOT 0.8 0.6   Hematology Recent Labs  Lab 03/31/19 0110 03/31/19 0224 03/31/19 0343 04/01/19 0400  WBC 6.1 6.6  --  7.8  RBC 3.92* 4.39  --  4.17*  HGB 11.6* 13.1 12.9* 12.2*  HCT 35.7* 40.0 38.0* 39.0  MCV 91.1 91.1  --  93.5  MCH 29.6 29.8  --  29.3  MCHC 32.5 32.8  --  31.3  RDW 16.7* 16.8*  --  17.0*  PLT 218 228  --  220   BNP Recent Labs  Lab 03/15/2019 1800 03/31/19 0224  BNP 483.0* 634.4*    DDimer No results for input(s): DDIMER in the last 168 hours.   Radiology/Studies:   Ct Abdomen Pelvis Wo Contrast Result Date: 03/31/2019 CLINICAL DATA:  Abdominal distension. EXAM: CT ABDOMEN AND PELVIS WITHOUT CONTRAST TECHNIQUE: Multidetector CT imaging of the abdomen and pelvis was performed following the standard protocol without IV contrast. COMPARISON:  October 04, 2018 FINDINGS: Lower chest: There is a moderate right and small left pleural effusion. There is consolidation involving the bilateral lung bases.The heart size is significantly enlarged. There is generalized  volume overload. Hepatobiliary: The liver is normal. The gallbladder is not visualized and may be surgically absent.There is no biliary ductal dilation. Pancreas: Normal contours without ductal dilatation. No peripancreatic fluid collection. Spleen: No splenic laceration or hematoma. Adrenals/Urinary Tract: --Adrenal glands: No adrenal hemorrhage. --Right kidney/ureter: No hydronephrosis or perinephric hematoma. --Left kidney/ureter: The left kidney is surgically absent. --Urinary bladder: The bladder is decompressed with a Foley catheter and therefore is poorly evaluated. Stomach/Bowel: --Stomach/Duodenum: Enteric tube terminates in the region of the gastric pylorus/first portion of the duodenum. --Small bowel: No dilatation or inflammation. --Colon: No focal abnormality. --Appendix: Normal. Vascular/Lymphatic: Atherosclerotic calcification is present within the non-aneurysmal abdominal aorta, without hemodynamically significant stenosis. --No retroperitoneal lymphadenopathy. --No mesenteric lymphadenopathy. --No pelvic or inguinal lymphadenopathy. Reproductive: Unremarkable Other: There is a small volume of free fluid in the abdomen and pelvis. There is generalized volume overload with body wall edema. Musculoskeletal. The patient is status post prior vertebral augmentation of the L1 vertebral body. There are degenerative changes throughout the visualized thoracolumbar spine. IMPRESSION: 1. Moderate right and small left pleural effusions. 2. Bibasilar airspace opacities, favored to represent atelectasis on the right with an infiltrate or aspiration on the left. 3. Cardiomegaly. There is generalized volume overload with a small amount of free fluid in the abdomen and pelvis. 4. Solitary right kidney without evidence of hydronephrosis. 5. Additional chronic findings as above. Electronically Signed   By: Constance Holster M.D.   On: 03/31/2019 01:08    Ct Head Wo Contrast Result Date: 03/31/2019 CLINICAL DATA:   78 year old male with cardiac arrest. EXAM: CT HEAD WITHOUT CONTRAST TECHNIQUE: Contiguous axial images were obtained from the base of the skull through the vertex without intravenous contrast. COMPARISON:  Head CT dated 06/29/2016 FINDINGS: Brain: Mild-to-moderate age-related atrophy and chronic microvascular ischemic changes. There is no acute intracranial hemorrhage. No mass effect or midline shift. No extra-axial fluid collection. Vascular: No hyperdense vessel or unexpected calcification. Skull: Normal. Negative for fracture or focal lesion. Sinuses/Orbits: No acute finding. Other: Partially visualized endotracheal and enteric tubes. IMPRESSION: 1. No acute intracranial hemorrhage. 2. Age-related atrophy and chronic microvascular ischemic changes. Electronically Signed   By: Anner Crete M.D.   On: 03/31/2019 01:12   Dg Chest 1v Repeat Same Day Result Date: 03/11/2019 CLINICAL  DATA:  Central line placement EXAM: CHEST - 1 VIEW SAME DAY COMPARISON:  March 30, 2019 at 6:21 p.m. FINDINGS: There is a newly placed right-sided central venous catheter with tip projecting over the proximal SVC. The endotracheal tube terminates above the carina by approximately 7.8 cm. The enteric tube is not fully visualized on this exam. Bilateral pulmonary opacities are again noted heart size is enlarged. Aortic calcifications are noted. The patient is status post prior median sternotomy. IMPRESSION: 1. Right-sided central venous catheter as above with no evidence of a right-sided pneumothorax. 2. Otherwise, stable appearance of the chest. The lung bases were not visualized on this exam. Electronically Signed   By: Constance Holster M.D.   On: 03/31/2019 19:30     Assessment and Plan:   1. OOH VT arrest     MMVT by EMS tracing shocked with 122J to [place] on amio gtt     Levo off about midnight     Remains intubated and sedated  2. CAD (old CABG)     On heparin gtt       3. AKI on CKD     (single  kidney)     Creat is rising  4. Acutely worsened ischemic R foot 5. PVD     Old left AKA     Vascular on case, R foot not salvageable    From an EP perspective, will await ischemic w/u cath, also will need to have final plan for his R foot as well.  If he has good recovery and AKA is needed, to reduce infection risk, he may be best served with life vest until RLE is healed.  No role for EP/device just yet Agree with amio for now  Dr. Caryl Comes will see           For questions or updates, please contact [facility] Please consult [email or web address] for contact info under     Signed, Baldwin Jamaica, PA-C  04/01/2019 8:23 AM   Ventricular Tachycardia--syncopal  SVT recurrent  CAD--ischemic cardiomyopathy  EF   Per Vas Disease -- s/p L BKA and now with Right leg requiring AKA  Respiratory failure--intubated  CKD  S/p L nephrectomy  Encephalopathy   Pt had syncopal VT in the absence of acute MI;  If he recovers he will need therapy for secondary prevention for cardiac arrest, typically an ICD. For now, will need supportive care.  I hope that his mental status improve  Can stop amiodarone  He has recurrent SVT  ( CL about 500-450 msec)  Not sure that it is worth therapy at this point.  If becomes problematic can resume amiodarone..  Will see again when he has made significant progress-- please let us know thaniks for consult Jolyn Nap

## 2019-04-01 NOTE — Progress Notes (Signed)
Patient oxygen saturation dropping to 85-88% on 40% FI02  RN spoke with RRT and was told to turn FI02 up to 100% and they would come around to assess shortly.   Patient at 92% at 100% FI02.

## 2019-04-01 NOTE — Progress Notes (Signed)
Hypoglycemic Event  CBG: 69  Treatment: 12.5 gm D50 IV  Symptoms: none  Follow-up CBG: Time:2015 CBG Result:96  Possible Reasons for Event: NPO for > 24h Comments/MD notified:    Lester Kinsman

## 2019-04-01 NOTE — Progress Notes (Signed)
Attempted to called patient's wife, Rakin Lemelle, to provide an update on the current status and plan for Mr. Zeien. Tried both home and cell phone numbers listed in the chart.   Ina Homes, MD IMTS PGY 3  Pager: [phone]

## 2019-04-01 NOTE — Progress Notes (Signed)
eLink Physician-Brief Progress Note Patient Name: [name] DOB: [birth year] MRN: [id number]   Date of Service  04/01/2019  HPI/Events of Note  Notified of 10 cc urine output the past hour. Adequate UO prior to that. Now off norepinephrine.  eICU Interventions  Will continue to monitor for now     Intervention Category Intermediate Interventions: Oliguria - evaluation and management  Judd Lien 04/01/2019, 4:35 AM

## 2019-04-01 NOTE — Consult Note (Signed)
REASON FOR CONSULT:    Right lower extremity.  The consult was requested by critical care medicine.  ASSESSMENT & PLAN:   ISCHEMIC RIGHT LOWER EXTREMITY: This patient has a profoundly ischemic right foot.  He has a non-retrievable capillary bed which would suggest that the foot is not salvageable.  Regardless, his wife tells me that he is developed gradual progression of the ischemic changes in the right lower extremity over the last month and I suspect that with pressors and his critical condition things have worsened suddenly.  I do not think there is any utility in taking him to surgery and I think that this would be associated with significant risk with really no chance for success.  Likewise, I do not think that there is any utility in considering a CT angiogram given that he has worsening renal function over the last several days.  The foot does not look to be salvageable.  If it progresses and he is medically stable he would require a above-the-knee amputation on the right.  I have explained this to his wife tonight over the phone.  I would agree with continuing his heparin.  I will follow.   [name], MD, FACS Beeper [phone] Office: [phone]   HPI:   [name] is a pleasant 78 y.o. male, who was admitted on 03/06/2019 with a V. tach pulseless arrest.  He was cardioverted and no CPR was administered.  Patient has multiple other medical comorbidities including coronary artery disease.  He is status post coronary revascularization.  In addition he has insulin-dependent diabetes, chronic kidney disease, COPD, and ischemic cardiomyopathy.  He had been on levo fed which was just discontinued.  The patient is currently on the vent and I am unable to obtain any history from him.  I did obtain some history from the wife as documented below.  Patient was seen by the nurse practitioner in our office on 02/20/2019.  At that time the right foot was somewhat cool he had some  chronic ischemic changes.  Doppler studies at that time showed a monophasic anterior tibial and posterior tibial signal on the right.  His ABI was 83% at that time which was stable but may be falsely elevated secondary to calcific disease given his diabetes.  His wife tells me tonight that after that visit he developed gradually progressive discoloration of the right foot and leg over the last month.  He has a history of a previous left nephrectomy with stage III chronic kidney disease.  His creatinine has been going up over the last several days.  Past Medical History:  Diagnosis Date  . Arthritis    "qwhere" (07/09/2013)  . Asthma   . COPD (chronic obstructive pulmonary disease) ([place])    pt denies this hx on 07/09/2013; has chronic dyspnea  . Coronary artery disease    Reportedly multivessel disease-CABG  . Gout   . H/O hiatal hernia 1997  . H/O: rheumatic fever    "had rheumatic fever as a kid" (07/09/2013) -has murmur  . Hyperlipidemia with target LDL less than 70   . Hypertension   . Ischemic cardiomyopathy    By report,-ICD  . Myocardial infarction ([place]) 03/2002; ~ 2011  . Neuropathy   . PAD (peripheral artery disease) ([place]) 2014   Right SFA stent November 2014; status post left AKA  . Pneumonia    "once" (07/09/2013)  . Renal disorder    "only have 1 kidney; due to go back soon to check  the other one" (07/09/2013)  . RUPTURE ROTATOR CUFF 12/03/2009   Qualifier: Diagnosis of  By: Aline Brochure MD, Dorothyann Peng    . Type II diabetes mellitus ([place])   . Ureter cancer ([place]) 12/2001   "shut down my kidney & resulted in nephrectomy" (07/09/2013)  . UTI (urinary tract infection) 10/04/2018    Family History  Problem Relation Age of Onset  . Cancer Mother   . Heart attack Father     SOCIAL HISTORY: He smokes 1-1/2 packs/day of cigarettes. Social History   Socioeconomic History  . Marital status: Married    Spouse name: Not on file  . Number of children: Not on file  . Years of education:  Not on file  . Highest education level: Not on file  Occupational History  . Not on file  Social Needs  . Financial resource strain: Not on file  . Food insecurity    Worry: Not on file    Inability: Not on file  . Transportation needs    Medical: Not on file    Non-medical: Not on file  Tobacco Use  . Smoking status: Current Every Day Smoker    Packs/day: 1.50    Years: 54.00    Pack years: 81.00    Types: Cigarettes  . Smokeless tobacco: Never Used  Substance and Sexual Activity  . Alcohol use: No  . Drug use: No  . Sexual activity: Not Currently  Lifestyle  . Physical activity    Days per week: Not on file    Minutes per session: Not on file  . Stress: Not on file  Relationships  . Social Herbalist on phone: Not on file    Gets together: Not on file    Attends religious service: Not on file    Active member of club or organization: Not on file    Attends meetings of clubs or organizations: Not on file    Relationship status: Not on file  . Intimate partner violence    Fear of current or ex partner: Not on file    Emotionally abused: Not on file    Physically abused: Not on file    Forced sexual activity: Not on file  Other Topics Concern  . Not on file  Social History Narrative  . Not on file    Allergies  Allergen Reactions  . Erythromycin Anaphylaxis and Rash  . Iohexol Anaphylaxis, Hives, Itching and Other (See Comments)    Note:  13 HR PRE-MED GIVEN AND SUCCESSFUL-ARS 07/15/07.     Marland Kitchen Penicillins Anaphylaxis, Itching and Other (See Comments)    Has patient had a PCN reaction causing immediate rash, facial/tongue/throat swelling, SOB or lightheadedness with hypotension: Yes Has patient had a PCN reaction causing severe rash involving mucus membranes or skin necrosis: No Has patient had a PCN reaction that required hospitalization No Has patient had a PCN reaction occurring within the last 10 years: No If all of the above answers are "NO", then  may proceed with Cephalosporin use.    Current Facility-Administered Medications  Medication Dose Route Frequency Provider Last Rate Last Dose  . 0.9 %  sodium chloride infusion   Intravenous Continuous Tyna Jaksch, MD   Stopped at 03/31/19 2143  . acetaminophen (TYLENOL) solution 650 mg  650 mg Per Tube Q6H PRN Margaretha Seeds, MD   650 mg at 04/01/19 0048  . amiodarone (NEXTERONE PREMIX) 360-4.14 MG/200ML-% (1.8 mg/mL) IV infusion  30 mg/hr Intravenous Continuous Orpah Melter,  Sela Hilding, MD 16.67 mL/hr at 04/01/19 0100 30 mg/hr at 04/01/19 0100  . aspirin chewable tablet 81 mg  81 mg Per Tube Daily Tyna Jaksch, MD      . cefTRIAXone (ROCEPHIN) 2 g in sodium chloride 0.9 % 100 mL IVPB  2 g Intravenous Q24H Lyndee Leo, [facility]   Stopped at 03/31/19 2124  . chlorhexidine gluconate (MEDLINE KIT) (PERIDEX) 0.12 % solution 15 mL  15 mL Mouth Rinse BID Tyna Jaksch, MD   15 mL at 03/31/19 2029  . Chlorhexidine Gluconate Cloth 2 % PADS 6 each  6 each Topical Daily Tyna Jaksch, MD   6 each at 03/31/19 0845  . dexmedetomidine (PRECEDEX) 400 MCG/100ML (4 mcg/mL) infusion  0.4-1.2 mcg/kg/hr Intravenous Titrated Tyna Jaksch, MD   Stopped at 03/31/19 1147  . famotidine (PEPCID) 40 MG/5ML suspension 20 mg  20 mg Per Tube QHS Tyna Jaksch, MD   20 mg at 03/31/19 2226  . fentaNYL (SUBLIMAZE) injection 25 mcg  25 mcg Intravenous Q15 min PRN Tyna Jaksch, MD      . fentaNYL (SUBLIMAZE) injection 25-100 mcg  25-100 mcg Intravenous Q30 min PRN Tyna Jaksch, MD   100 mcg at 04/01/19 0051  . fentaNYL 2552mg in NS 2548m(1085mml) infusion-PREMIX  0-400 mcg/hr Intravenous Continuous IzqTyna JakschD 27.5 mL/hr at 04/01/19 0100 275 mcg/hr at 04/01/19 0100  . heparin ADULT infusion 100 units/mL (25000 units/250m57mdium chloride 0.45%)  1,550 Units/hr Intravenous Continuous BitoEinar GradH 15.5 mL/hr at 04/01/19 0100 1,550 Units/hr at 04/01/19 0100   . insulin aspart (novoLOG) injection 0-15 Units  0-15 Units Subcutaneous Q4H IzquTyna Jaksch   2 Units at 03/31/19 [id number] mouth rinse  15 mL Mouth Rinse 10 times per day IzquTyna Jaksch   15 mL at 04/01/19 0050  . norepinephrine (LEVOPHED) 16 mg in 250mL8mmix infusion  0-40 mcg/min Intravenous Titrated IzquiTyna Jaksch  Stopped at 04/01/19 0035 [phone] (DIPRIVAN) 1000 MG/100ML infusion  5-80 mcg/kg/min Intravenous Continuous IzquiTyna Jaksch  Stopped at 03/31/19 0446 [phone] chloride flush (NS) 0.9 % injection 10-40 mL  10-40 mL Intracatheter Q12H IzquiTyna Jaksch  20 mL at 03/31/19 2100  . sodium chloride flush (NS) 0.9 % injection 10-40 mL  10-40 mL Intracatheter PRN IzquiTyna Jaksch       REVIEW OF SYSTEMS: Unable to obtain  PHYSICAL EXAM:   Vitals:   04/01/19 0015 04/01/19 0030 04/01/19 0045 04/01/19 0100  BP: 100/68 110/77 90/67 (!) 88/59  Pulse: 73 98 (!) 120 89  Resp:    18  Temp: 100 F (37.8 C) 100 F (37.8 C) 100.2 F (37.9 C) 100.2 F (37.9 C)  TempSrc:      SpO2:    95%  Weight:      Height:       GENERAL: The patient is a markedly debilitated male, who is currently on the ventilator and sedated. The vital signs are documented above. CARDIAC: Tachycardic VASCULAR:  On the right side, he has a dampened monophasic femoral signal with the Doppler.  The only signal I can obtain distally with the Doppler is the peroneal artery.  The right foot is cool with a non-retrievable capillary bed. I cannot palpate a left femoral pulse. PULMONARY: There is good air exchange bilaterally without wheezing or rales. ABDOMEN: Soft and non-tender with  normal pitched bowel sounds.  MUSCULOSKELETAL: He has a left above-the-knee amputation. NEUROLOGIC: He is sedated and on the vent. SKIN: The right foot has chronic ischemic changes and is now cool with a non-retrievable capillary bed  DATA:    CT ABDOMEN PELVIS: This was  not a CT angiogram, however this shows severe calcific disease of his aorta, common iliac arteries, and external iliac arteries.  LABS: His GFR is 29.  Creatinine is 2.16 which has been increasing.  His troponin is 88.  White blood cell count 6.6 thousand.  Platelet count 228,000.  Hemoglobin 12.9

## 2019-04-01 NOTE — Progress Notes (Signed)
Progress Note  Patient Name: [name] Date of Encounter: 04/01/2019  Primary Cardiologist: new, saw Dr. Ellyn Hack in consult, but from [facility] area  Subjective   Intubated; but somewhat alert  Inpatient Medications    Scheduled Meds:  aspirin  81 mg Per Tube Daily   chlorhexidine gluconate (MEDLINE KIT)  15 mL Mouth Rinse BID   Chlorhexidine Gluconate Cloth  6 each Topical Daily   famotidine  20 mg Per Tube QHS   insulin aspart  0-15 Units Subcutaneous Q4H   mouth rinse  15 mL Mouth Rinse 10 times per day   sodium chloride flush  10-40 mL Intracatheter Q12H   Continuous Infusions:  sodium chloride 10 mL/hr at 04/01/19 0800   amiodarone 30 mg/hr (04/01/19 0800)   cefTRIAXone (ROCEPHIN)  IV Stopped (03/31/19 2124)   dexmedetomidine Stopped (03/31/19 1147)   fentaNYL infusion INTRAVENOUS 150 mcg/hr (04/01/19 0800)   heparin 1,550 Units/hr (04/01/19 0800)   norepinephrine (LEVOPHED) Adult infusion Stopped (04/01/19 0035)   propofol (DIPRIVAN) infusion Stopped (03/31/19 0446)   PRN Meds: acetaminophen (TYLENOL) oral liquid 160 mg/5 mL, fentaNYL (SUBLIMAZE) injection, fentaNYL (SUBLIMAZE) injection, sodium chloride flush   Vital Signs    Vitals:   04/01/19 0730 04/01/19 0800 04/01/19 0809 04/01/19 0830  BP: 119/76 114/68  110/65  Pulse: 94 90 97 88  Resp: 18 16 18 18   Temp: 99.7 F (37.6 C) 99.7 F (37.6 C)  99.7 F (37.6 C)  TempSrc: Bladder Bladder    SpO2: 94% 96% 95% 95%  Weight:      Height:        Intake/Output Summary (Last 24 hours) at 04/01/2019 0859 Last data filed at 04/01/2019 0800 Gross per 24 hour  Intake 1604.27 ml  Output 1300 ml  Net 304.27 ml    I/O since admission:   Filed Weights   03/31/19 0000 03/31/19 0348 04/01/19 0400  Weight: 98 kg 98.1 kg 95.9 kg    Telemetry    Sinus rhythm in the 90 - Personally Reviewed  ECG    ECG (independently read by me): NSR at 74; Ist degree AV block; PR 246 msec; NST  changes  Physical Exam   BP 110/65    Pulse 88    Temp 99.7 F (37.6 C)    Resp 18    Ht 6' (1.829 m)    Wt 95.9 kg    SpO2 95%    BMI 28.67 kg/m  General: Awakens to  Skin: normal turgor, no rashes, warm and dry HEENT: Normocephalic, atraumatic. Pupils equal round and reactive to light; sclera anicteric; extraocular muscles intact;  Nose without nasal septal hypertrophy Mouth/Parynx intubated Neck: No JVD, no carotid bruits; normal carotid upstroke Lungs: rhonchi Chest wall: without tenderness to palpitation Heart: PMI not displaced, RRR, s1 s2 normal, 1/6 systolic murmur, no diastolic murmur, no rubs, gallops, thrills, or heaves Abdomen: soft, nontender; no hepatosplenomehaly, BS+; abdominal aorta nontender and not dilated by palpation. Back: no CVA tenderness Pulses 2+ Musculoskeletal: full range of motion, normal strength, no joint deformities Extremities: noL AKA; cold ischemic RLE  Neurologic: grossly nonfocal; Cranial nerves grossly wnl   Labs    Chemistry Recent Labs  Lab 03/12/2019 1838  03/31/19 0110 03/31/19 0224 03/31/19 0343 03/31/19 1720 04/01/19 0400  NA 135   < > 136 136 135 137 136  K 4.6   < > 4.5 4.7 4.4 5.0 4.5  CL 105  --  102 102  --  104 103  CO2 20*  --  23 21*  --  23 23  GLUCOSE 169*  --  145* 128*  --  101* 90  BUN 31*  --  30* 29*  --  33* 36*  CREATININE 1.55*  --  1.77* 1.80*  --  2.16* 2.42*  CALCIUM 8.3*  --  8.4* 8.7*  --  8.3* 8.0*  PROT 5.7*  --  5.4*  --   --   --   --   ALBUMIN 2.9*  --  2.8*  --   --   --   --   AST 24  --  19  --   --   --   --   ALT 19  --  17  --   --   --   --   ALKPHOS 57  --  59  --   --   --   --   BILITOT 0.8  --  0.6  --   --   --   --   GFRNONAA 43*  --  36* 36*  --  29* 25*  GFRAA 50*  --  42* 41*  --  33* 29*  ANIONGAP 10  --  11 13  --  10 10   < > = values in this interval not displayed.     Hematology Recent Labs  Lab 03/31/19 0110 03/31/19 0224 03/31/19 0343 04/01/19 0400  WBC 6.1 6.6   --  7.8  RBC 3.92* 4.39  --  4.17*  HGB 11.6* 13.1 12.9* 12.2*  HCT 35.7* 40.0 38.0* 39.0  MCV 91.1 91.1  --  93.5  MCH 29.6 29.8  --  29.3  MCHC 32.5 32.8  --  31.3  RDW 16.7* 16.8*  --  17.0*  PLT 218 228  --  220    Cardiac EnzymesNo results for input(s): TROPONINI in the last 168 hours. No results for input(s): TROPIPOC in the last 168 hours.   BNP Recent Labs  Lab 03/29/2019 1800 03/31/19 0224  BNP 483.0* 634.4*     DDimer No results for input(s): DDIMER in the last 168 hours.   Lipid Panel     Component Value Date/Time   CHOL  09/02/2010 0531    175        ATP III CLASSIFICATION:  <200     mg/dL   Desirable  200-239  mg/dL   Borderline High  >=240    mg/dL   High          TRIG 189 (H) 09/02/2010 0531   HDL 71 09/02/2010 0531   CHOLHDL 2.5 09/02/2010 0531   VLDL 38 09/02/2010 0531   LDLCALC  09/02/2010 0531    66        Total Cholesterol/HDL:CHD Risk Coronary Heart Disease Risk Table                     Men   Women  1/2 Average Risk   3.4   3.3  Average Risk       5.0   4.4  2 X Average Risk   9.6   7.1  3 X Average Risk  23.4   11.0        Use the calculated Patient Ratio above and the CHD Risk Table to determine the patient's CHD Risk.        ATP III CLASSIFICATION (LDL):  <100     mg/dL   Optimal  100-129  mg/dL  Near or Above                    Optimal  130-159  mg/dL   Borderline  160-189  mg/dL   High  >190     mg/dL   Very High       Radiology    Ct Abdomen Pelvis Wo Contrast  Result Date: 03/31/2019 CLINICAL DATA:  Abdominal distension. EXAM: CT ABDOMEN AND PELVIS WITHOUT CONTRAST TECHNIQUE: Multidetector CT imaging of the abdomen and pelvis was performed following the standard protocol without IV contrast. COMPARISON:  October 04, 2018 FINDINGS: Lower chest: There is a moderate right and small left pleural effusion. There is consolidation involving the bilateral lung bases.The heart size is significantly enlarged. There is generalized  volume overload. Hepatobiliary: The liver is normal. The gallbladder is not visualized and may be surgically absent.There is no biliary ductal dilation. Pancreas: Normal contours without ductal dilatation. No peripancreatic fluid collection. Spleen: No splenic laceration or hematoma. Adrenals/Urinary Tract: --Adrenal glands: No adrenal hemorrhage. --Right kidney/ureter: No hydronephrosis or perinephric hematoma. --Left kidney/ureter: The left kidney is surgically absent. --Urinary bladder: The bladder is decompressed with a Foley catheter and therefore is poorly evaluated. Stomach/Bowel: --Stomach/Duodenum: Enteric tube terminates in the region of the gastric pylorus/first portion of the duodenum. --Small bowel: No dilatation or inflammation. --Colon: No focal abnormality. --Appendix: Normal. Vascular/Lymphatic: Atherosclerotic calcification is present within the non-aneurysmal abdominal aorta, without hemodynamically significant stenosis. --No retroperitoneal lymphadenopathy. --No mesenteric lymphadenopathy. --No pelvic or inguinal lymphadenopathy. Reproductive: Unremarkable Other: There is a small volume of free fluid in the abdomen and pelvis. There is generalized volume overload with body wall edema. Musculoskeletal. The patient is status post prior vertebral augmentation of the L1 vertebral body. There are degenerative changes throughout the visualized thoracolumbar spine. IMPRESSION: 1. Moderate right and small left pleural effusions. 2. Bibasilar airspace opacities, favored to represent atelectasis on the right with an infiltrate or aspiration on the left. 3. Cardiomegaly. There is generalized volume overload with a small amount of free fluid in the abdomen and pelvis. 4. Solitary right kidney without evidence of hydronephrosis. 5. Additional chronic findings as above. Electronically Signed   By: Constance Holster M.D.   On: 03/31/2019 01:08   Ct Head Wo Contrast  Result Date: 03/31/2019 CLINICAL DATA:   78 year old male with cardiac arrest. EXAM: CT HEAD WITHOUT CONTRAST TECHNIQUE: Contiguous axial images were obtained from the base of the skull through the vertex without intravenous contrast. COMPARISON:  Head CT dated 06/29/2016 FINDINGS: Brain: Mild-to-moderate age-related atrophy and chronic microvascular ischemic changes. There is no acute intracranial hemorrhage. No mass effect or midline shift. No extra-axial fluid collection. Vascular: No hyperdense vessel or unexpected calcification. Skull: Normal. Negative for fracture or focal lesion. Sinuses/Orbits: No acute finding. Other: Partially visualized endotracheal and enteric tubes. IMPRESSION: 1. No acute intracranial hemorrhage. 2. Age-related atrophy and chronic microvascular ischemic changes. Electronically Signed   By: Anner Crete M.D.   On: 03/31/2019 01:12   Dg Chest 1v Repeat Same Day  Result Date: 03/26/2019 CLINICAL DATA:  Central line placement EXAM: CHEST - 1 VIEW SAME DAY COMPARISON:  March 30, 2019 at 6:21 p.m. FINDINGS: There is a newly placed right-sided central venous catheter with tip projecting over the proximal SVC. The endotracheal tube terminates above the carina by approximately 7.8 cm. The enteric tube is not fully visualized on this exam. Bilateral pulmonary opacities are again noted heart size is enlarged. Aortic calcifications are noted. The patient is status  post prior median sternotomy. IMPRESSION: 1. Right-sided central venous catheter as above with no evidence of a right-sided pneumothorax. 2. Otherwise, stable appearance of the chest. The lung bases were not visualized on this exam. Electronically Signed   By: Constance Holster M.D.   On: 03/26/2019 19:30   Dg Chest Port 1 View  Result Date: 04/01/2019 CLINICAL DATA:  ETT placement.  History of diabetes, hypertension. EXAM: PORTABLE CHEST 1 VIEW COMPARISON:  Chest radiograph 03/10/2019, CT abdomen/pelvis 03/31/2019. FINDINGS: Single-view AP semi upright chest  radiograph. The endotracheal tube terminates approximately 5 cm above the carina. An enteric tube courses below the level of the left hemidiaphragm and is cut from the field of view. Unchanged position of a right IJ approach central venous catheter. Sequela of prior median sternotomy. Cardiomegaly. Atherosclerotic calcification of the aorta. Interval increase in size of a right pleural effusion. Interval increase in right basilar and right perihilar opacities. Mild left basilar atelectasis. A small left pleural effusion was better appreciated on recent prior CT abdomen/pelvis. No evidence of pneumothorax. IMPRESSION: ET tube terminates approximately 5 cm above the carina. Additional lines and tubes as detailed. Interval increase in right pleural effusion. Interval increase in right basilar and right perihilar opacities which may reflect atelectasis and/or developing pneumonia. Mild left basilar atelectasis. A small left pleural effusion was better appreciated on recent prior CT abdomen/pelvis. Electronically Signed   By: Kellie Simmering   On: 04/01/2019 08:38   Dg Chest Port 1 View  Result Date: 03/22/2019 CLINICAL DATA:  Intubation EXAM: PORTABLE CHEST 1 VIEW COMPARISON:  March 30, 2019 FINDINGS: The endotracheal tube terminates approximately 6.6 cm above the carina. The enteric tube appears to extend below the left hemidiaphragm. The right-sided central venous catheter is well position. The heart size remains enlarged. There is no pneumothorax. Bibasilar airspace opacities are again noted. There are small bilateral pleural effusions. IMPRESSION: 1. Lines and tubes as above. 2. No significant interval change including persistent bibasilar airspace opacities and likely trace to small bilateral pleural effusions. Electronically Signed   By: Constance Holster M.D.   On: 03/27/2019 23:29   Dg Chest Port 1 View  Result Date: 03/15/2019 CLINICAL DATA:  Arrest, tube placement EXAM: PORTABLE CHEST 1 VIEW COMPARISON:   Portable exam 1821 hours compared to 10/04/2018 FINDINGS: Tip of endotracheal tube 8.2 cm above carina. Nasogastric tube extends into abdomen. Enlargement of cardiac silhouette with pulmonary vascular congestion. Atherosclerotic calcification aorta. Perihilar infiltrates likely pulmonary edema. No pleural effusion or pneumothorax. IMPRESSION: Tube positions as above. BILATERAL pulmonary infiltrates question pulmonary edema. Electronically Signed   By: Lavonia Dana M.D.   On: 03/23/2019 18:56    Cardiac Studies   ECHO IMPRESSIONS: 03/31/2019  1. The left ventricle has severely reduced systolic function, with an ejection fraction of 25-30%. The cavity size was normal. Indeterminate diastolic filling due to E-A fusion.  2. LVEF is severely depressed; proximal portions of LV move the best but are not normal.  3. The right ventricle has normal systolic function. The cavity was mildly enlarged. There is no increase in right ventricular wall thickness.  4. Left atrial size was mildly dilated.  5. Right atrial size was moderately dilated.  6. The mitral valve is grossly normal. There is mild mitral annular calcification present. Mitral valve regurgitation is mild to moderate by color flow Doppler.  7. Tricuspid valve regurgitation is moderate.  8. The inferior vena cava was dilated in size with <50% respiratory variability.  9. The left ventricular  function has worsened.  Patient Profile     [name] is a 78 y.o. male with a hx of CAD-prior MI with CABG (per family CABG x5 was in 2004 -[place] -by report, has had at least one catheterization prior to his AKA with no stents.) resulting in ischemic cardiomyopathy with chronic combined systolic and diastolic heart failure), COPD, PAD (right SFA stent, left AKA), hypertension who suffered a cardiac arrest 03/07/2019.  Assessment & Plan    1. Day 2 s/p VT cardiac arrest with 1 shock and restoration of ROSC.  2.  Combined cardiomyopathy:  EF 25- 30%: currently  EF prior to arrest not yet known.  Levophed now off.  Once stable will need initiation of heart failure meds  but will not start today. BNP 634 on 7/26  3. Cardiogenic shock:  Was on levophed; weaned and dc'd;  BP now 115/73  4.  CAD/ s/p CABG in [place], [place]. Will need to obtain data.  Does not appear to be an acute MI/STEMI.  HS Troponin is low at 89.  Depending on neurologic function and future PVD surgery, will need ischemic evaluation with probable cath if renal ok and EP evaluation.  5. PVD: prior R AKA, now with ischemic cold R foot with non-retrievable capillary bed, felt to be non-salvageable and most likely will nee R AKA.  6. AKI: Previous left nephrectomy with baseline Stage 3 CKD.  Cr increased today to 2.42 (1.55 on 7/25)   CC: 40 minutes   Signed, Troy Sine, MD, [facility] 04/01/2019, 8:59 AM

## 2019-04-01 NOTE — Progress Notes (Signed)
   VASCULAR SURGERY ASSESSMENT & PLAN:   ISCHEMIC RIGHT LOWER EXTREMITY: As per my note last night, I do not think that the right foot is salvageable.  He has multilevel arterial occlusive disease based on noninvasive studies in June and also a CT of the abdomen and pelvis which shows diffuse calcific disease of his aorta, common iliac arteries, and external iliac arteries.  He began developing progressive ischemia back in June according to his wife and this is continued to progress.  With his critical illness currently this has rapidly deteriorated.  The foot will likely continue to deteriorate and the only remaining option would be a right above-the-knee amputation.  Vascular surgery will continue to follow.   SUBJECTIVE:   Sedated on vent.  PHYSICAL EXAM:   Vitals:   04/01/19 0430 04/01/19 0500 04/01/19 0530 04/01/19 0600  BP: (!) 83/55 (!) 91/59 105/61 124/70  Pulse: 75 73 84 93  Resp: '18 18 18 '$ (!) 25  Temp: 99.9 F (37.7 C) 99.7 F (37.6 C) 99.7 F (37.6 C) 99.7 F (37.6 C)  TempSrc:      SpO2: 95% 95% 92% 92%  Weight:      Height:       Barely audible peroneal signal with the Doppler only. Non-retrievable capillary bed right foot  LABS:   Lab Results  Component Value Date   WBC 7.8 04/01/2019   HGB 12.2 (L) 04/01/2019   HCT 39.0 04/01/2019   MCV 93.5 04/01/2019   PLT 220 04/01/2019   Lab Results  Component Value Date   CREATININE 2.42 (H) 04/01/2019   Lab Results  Component Value Date   INR 1.4 (H) 04/01/2019   CBG (last 3)  Recent Labs    03/31/19 2144 03/31/19 2353 04/01/19 0354  GLUCAP 86 81 82    PROBLEM LIST:    Principal Problem:   Cardiac arrest ([place]) Active Problems:   ACC/AHA stage B congestive heart failure due to ischemic cardiomyopathy (HCC)   Peripheral vascular disease (HCC)   Acute respiratory failure with hypercapnia (HCC)   Coronary artery disease involving native coronary artery of native heart with angina pectoris (HCC)  Diabetes mellitus with peripheral vascular disease ([place])   Tobacco abuse   Ventricular tachycardia (HCC)   Acute on chronic combined systolic and diastolic CHF (congestive heart failure) (HCC)   Demand ischemia (HCC)   CURRENT MEDS:   . aspirin  81 mg Per Tube Daily  . chlorhexidine gluconate (MEDLINE KIT)  15 mL Mouth Rinse BID  . Chlorhexidine Gluconate Cloth  6 each Topical Daily  . famotidine  20 mg Per Tube QHS  . insulin aspart  0-15 Units Subcutaneous Q4H  . mouth rinse  15 mL Mouth Rinse 10 times per day  . sodium chloride flush  10-40 mL Intracatheter Q12H    [name] Beeper: [phone] Office: [phone] 04/01/2019

## 2019-04-01 NOTE — Progress Notes (Signed)
NAME:  [name], MRN:  [id number], DOB:  December 21, 1940, LOS: 2 ADMISSION DATE:  03/07/2019, CONSULTATION DATE:  03/31/2019 REFERRING MD:  Forestine Na EM (Dr. Sabra Heck), CHIEF COMPLAINT:  S/p VT and 1x cardioversion  Brief History   78 yo M s/p VT arrest s/p cardioversion x 1 by EMS, unclear if patient was pulseless or had pulse. Intubated at [facility] ED and transferred to [facility]  History of present illness   History obtained per chart review  78 yo M PMH COPD, HTN, CAD with prior MI and s/p CABG 4650, ICM, systolic heart failure, DM2, CKD III (baseline Cr 1.3), hx ureter cancer s/p L nephrectomy, PVD s/p L AKA who presented to [facility] ED 7/25 s/p VT with 1x cardioversion and subsequent conversion to narrow complex tachycardic rhythm. It is unclear if patient lost pulse and ROSC achieved, or if patient retained pulse and was cardioverted x1.  On 7/25 the patient had reportedly "not been feeling well this morning," and EMS was dispatched to patient's home for AMS, altered level of consciousness. Patient has reportedly been increasingly SOB x several days and has been using BDs with increasing frequency. Patient reportedly also with severe lower extremity swelling, extending to groin. Patient reportedly unresponsive on Ems arrival, noted to be in [place]. Unclear if pulse vs pulseless VT. Unclear if cardioversion vs Defib x1 at 120J, with subsequent narrow complex rhythm. Patient required transient BVM ventilation but was reportedly breathing spontaneously. No CPR was required, patient received no other pre-hospital medications.  Upon arrival to [facility] ED, patient was intubated for unresponsiveness. A CVC was placed. Patient given amiodarone and is being transferred to [facility].   SARS-CoV2 Negative 03/06/2019  Past Medical History  COPD DM2 CAD, MI, s/p CABG  Systolic heart failure ICM HTN HLD EtOH abuse FTT PVD  Ureter Cancer s/p L nephrectomy  CKD III   Significant Hospital Events    7/25 s/p VT arrest with ROSC after 1x cardioversion 120J. Intubated at [facility], CVC placed. Transferring to [facility] for ICU admission   Consults:  Vascular surgery  Cardiology   Procedures:  7/25 ETT>> 7/25 CVC >>   Significant Diagnostic Tests:  CXR 7/25> ETT and CVC in adequate position. No pneumothorax. Cardiomegaly. Bilateral pulmonary infiltrates vs edema   CT Head 7/25 > NAICA CT Abdomen/Pelvis 7/25 - Bilateral pleural effusions, bibasilar airspace opacities, cardiomegaly, anasarca, absent left kidney  Micro Data:  7/25 SARS CoV2 > negative  7/26 BCX > 7/26 RCX >   Antimicrobials:  7/25 Rocephin>>   Interim history/subjective:  Increasing oxygen demand overnight.   Objective   Blood pressure 115/71, pulse 93, temperature 99.7 F (37.6 C), resp. rate 18, height 6' (1.829 m), weight 95.9 kg, SpO2 99 %.    Vent Mode: PRVC FiO2 (%):  [40 %-100 %] 100 % Set Rate:  [18 bmp] 18 bmp Vt Set:  [510 mL] 510 mL PEEP:  [5 cmH20] 5 cmH20 Plateau Pressure:  [14 cmH20-19 cmH20] 15 cmH20   Intake/Output Summary (Last 24 hours) at 04/01/2019 0749 Last data filed at 04/01/2019 0700 Gross per 24 hour  Intake 1674.3 ml  Output 1340 ml  Net 334.3 ml   Filed Weights   03/31/19 0000 03/31/19 0348 04/01/19 0400  Weight: 98 kg 98.1 kg 95.9 kg   Physical Exam: General: Chronically ill-appearing, sedated HENT: [place], AT, ETT in place Eyes: EOMI, no scleral icterus Respiratory: Anterior rhonchi bilaterally Cardiovascular: RRR, -M/R/G, no JVD GI: BS+, soft, nontender  Extremities: RLE erythema, L AKA-tenderness Neuro: Sedated GU: Foley in place  Resolved Hospital Problem list     Assessment & Plan:   S/p VT arrest with cardioversion, presumed pulseless VT Echo with LVEF 25-30% with RAE and LAE Plan Telemetry Appreciate Cardiology consult Continue Amiodarone Continue Heparin gtt  Acute on chronic respiratory failure requiring intubation 2/2 pulmonary edema, possible  aspiration during arrest COPD  Plan Full vent support Continue Rocephin for suspected aspiration pna F/u blood and trach aspirate, NGTD  PAD protocol for goal RASS 0 and -1: Wean fentanyl as tolerated VAP  Chronic systolic heart failure (last EF 45-50% in 2018) NICM/CAD/HTN LVEF down to 25-30% Plan Management as above Diuresis as tolerated  AoCKD III Hx Ureter cancer s/p L nephrectomy L abdominal protuberance  Baseline Cr approx 1.3. risk for AKI in setting of cardiac arrest Plan Strict I/O Trend UOP/BMP  PVD  RLE acute ischemia  Plan Appreciate vascular surgery consultation. No intervention at this time Continue heparin drip  Midline Sacral Wound -Followed by wound care P WOCN consult for inpatient recs   DM2 Plan SSI  Best practice:  Diet: NPO  Pain/Anxiety/Delirium protocol (if indicated): Fent, Precedex VAP protocol (if indicated): yes  DVT prophylaxis: SCD  GI prophylaxis: Pepcid Glucose control: SSI Mobility: BR  Code Status: Full  Family Communication: Will call to update family today  Disposition: ICU  Labs   CBC: Recent Labs  Lab 03/25/2019 1800 03/16/2019 2338 03/31/19 0110 03/31/19 0224 03/31/19 0343 04/01/19 0400  WBC 8.0  --  6.1 6.6  --  7.8  NEUTROABS 5.6  --  4.7  --   --   --   HGB 13.0 13.9 11.6* 13.1 12.9* 12.2*  HCT 40.5 41.0 35.7* 40.0 38.0* 39.0  MCV 91.2  --  91.1 91.1  --  93.5  PLT 289  --  218 228  --  456    Basic Metabolic Panel: Recent Labs  Lab 03/23/2019 1838  03/31/19 0110 03/31/19 0224 03/31/19 0343 03/31/19 1720 04/01/19 0400  NA 135   < > 136 136 135 137 136  K 4.6   < > 4.5 4.7 4.4 5.0 4.5  CL 105  --  102 102  --  104 103  CO2 20*  --  23 21*  --  23 23  GLUCOSE 169*  --  145* 128*  --  101* 90  BUN 31*  --  30* 29*  --  33* 36*  CREATININE 1.55*  --  1.77* 1.80*  --  2.16* 2.42*  CALCIUM 8.3*  --  8.4* 8.7*  --  8.3* 8.0*  MG 2.3  --  2.1 2.1  --   --  2.0  PHOS  --   --  4.1 3.9  --   --   --     < > = values in this interval not displayed.   GFR: Estimated Creatinine Clearance: 31.2 mL/min (A) (by C-G formula based on SCr of 2.42 mg/dL (H)). Recent Labs  Lab 03/06/2019 1800 03/31/19 0110 03/31/19 0224 04/01/19 0400  WBC 8.0 6.1 6.6 7.8    Liver Function Tests: Recent Labs  Lab 03/07/2019 1838 03/31/19 0110  AST 24 19  ALT 19 17  ALKPHOS 57 59  BILITOT 0.8 0.6  PROT 5.7* 5.4*  ALBUMIN 2.9* 2.8*   No results for input(s): LIPASE, AMYLASE in the last 168 hours. No results for input(s): AMMONIA in the last 168 hours.  ABG  Component Value Date/Time   PHART 7.443 03/31/2019 0343   PCO2ART 32.7 03/31/2019 0343   PO2ART 71.0 (L) 03/31/2019 0343   HCO3 22.6 03/31/2019 0343   TCO2 24 03/31/2019 0343   ACIDBASEDEF 1.0 03/31/2019 0343   O2SAT 96.0 03/31/2019 0343     Coagulation Profile: Recent Labs  Lab 03/31/19 0110 04/01/19 0400  INR 1.4* 1.4*    Cardiac Enzymes: No results for input(s): CKTOTAL, CKMB, CKMBINDEX, TROPONINI in the last 168 hours.  HbA1C: Hgb A1c MFr Bld  Date/Time Value Ref Range Status  05/24/2017 07:04 PM 6.8 (H) 4.8 - 5.6 % Final    Comment:    (NOTE) Pre diabetes:          5.7%-6.4% Diabetes:              >6.4% Glycemic control for   <7.0% adults with diabetes   06/29/2016 08:50 AM 6.2 (H) 4.8 - 5.6 % Final    Comment:    (NOTE)         Pre-diabetes: 5.7 - 6.4         Diabetes: >6.4         Glycemic control for adults with diabetes: <7.0     CBG: Recent Labs  Lab 03/31/19 2024 03/31/19 2144 03/31/19 2353 04/01/19 0354 04/01/19 [place] [phone] [place]

## 2019-04-01 NOTE — Progress Notes (Signed)
ANTICOAGULATION CONSULT NOTE - [place] for heparin Indication: chest pain/ACS  Allergies  Allergen Reactions  . Erythromycin Anaphylaxis and Rash  . Iohexol Anaphylaxis, Hives, Itching and Other (See Comments)    Note:  13 HR PRE-MED GIVEN AND SUCCESSFUL-ARS 07/15/07.     Marland Kitchen Penicillins Anaphylaxis, Itching and Other (See Comments)    Has patient had a PCN reaction causing immediate rash, facial/tongue/throat swelling, SOB or lightheadedness with hypotension: Yes Has patient had a PCN reaction causing severe rash involving mucus membranes or skin necrosis: No Has patient had a PCN reaction that required hospitalization No Has patient had a PCN reaction occurring within the last 10 years: No If all of the above answers are "NO", then may proceed with Cephalosporin use.    Patient Measurements: Height: 6' (182.9 cm) Weight: 211 lb 6.7 oz (95.9 kg) IBW/kg (Calculated) : 77.6 Heparin Dosing Weight: 97kg  Vital Signs: Temp: 99.7 F (37.6 C) (07/27 0930) Temp Source: Bladder (07/27 0800) BP: 118/68 (07/27 0930) Pulse Rate: 89 (07/27 0930)  Labs: Recent Labs    03/31/19 0110 03/31/19 0224 03/31/19 0343 03/31/19 1037 03/31/19 1354 03/31/19 1720 03/31/19 2300 04/01/19 0400 04/01/19 0920  HGB 11.6* 13.1 12.9*  --   --   --   --  12.2*  --   HCT 35.7* 40.0 38.0*  --   --   --   --  39.0  --   PLT 218 228  --   --   --   --   --  220  --   APTT 36  --   --   --   --   --   --   --   --   LABPROT 16.7*  --   --   --   --   --   --  16.7*  --   INR 1.4*  --   --   --   --   --   --  1.4*  --   HEPARINUNFRC  --   --   --   --  0.20*  --  0.21*  --  0.48  CREATININE 1.77* 1.80*  --   --   --  2.16*  --  2.42*  --   TROPONINIHS 76* 79*  --  86*  --  89* 88*  --   --     Estimated Creatinine Clearance: 31.2 mL/min (A) (by C-G formula based on SCr of 2.42 mg/dL (H)).   Medical History: Past Medical History:  Diagnosis Date  . Arthritis    "qwhere"  (07/09/2013)  . Asthma   . COPD (chronic obstructive pulmonary disease) ([place])    pt denies this hx on 07/09/2013; has chronic dyspnea  . Coronary artery disease    Reportedly multivessel disease-CABG  . Gout   . H/O hiatal hernia 1997  . H/O: rheumatic fever    "had rheumatic fever as a kid" (07/09/2013) -has murmur  . Hyperlipidemia with target LDL less than 70   . Hypertension   . Ischemic cardiomyopathy    By report,-ICD  . Myocardial infarction ([place]) 03/2002; ~ 2011  . Neuropathy   . PAD (peripheral artery disease) ([place]) 2014   Right SFA stent November 2014; status post left AKA  . Pneumonia    "once" (07/09/2013)  . Renal disorder    "only have 1 kidney; due to go back soon to check the other one" (07/09/2013)  . RUPTURE ROTATOR CUFF 12/03/2009  Qualifier: Diagnosis of  By: Aline Brochure MD, Dorothyann Peng    . Type II diabetes mellitus ([place])   . Ureter cancer ([place]) 12/2001   "shut down my kidney & resulted in nephrectomy" (07/09/2013)  . UTI (urinary tract infection) 10/04/2018     Assessment: 80 yoM with VT and concern for ACS (hx MI and CABG) also with ischemic right foot on heparin. Plans for cath based on neurologic recovery -heparin level at goal, CBC stable  Goal of Therapy:  Heparin level 0.3-0.7 units/ml Monitor platelets by anticoagulation protocol: Yes   Plan:  -No heparin changes needed -Daily heparin level and CBC  Hildred Laser, PharmD Clinical Pharmacist **Pharmacist phone directory can now be found on [email or web address] ([email or web address]).  Listed under [place].

## 2019-04-02 DIAGNOSIS — I998 Other disorder of circulatory system: Secondary | ICD-10-CM

## 2019-04-02 DIAGNOSIS — I70229 Atherosclerosis of native arteries of extremities with rest pain, unspecified extremity: Secondary | ICD-10-CM

## 2019-04-02 DIAGNOSIS — J9600 Acute respiratory failure, unspecified whether with hypoxia or hypercapnia: Secondary | ICD-10-CM

## 2019-04-02 LAB — BASIC METABOLIC PANEL
Anion gap: 13 (ref 5–15)
BUN: 44 mg/dL — ABNORMAL HIGH (ref 8–23)
CO2: 20 mmol/L — ABNORMAL LOW (ref 22–32)
Calcium: 7.8 mg/dL — ABNORMAL LOW (ref 8.9–10.3)
Chloride: 104 mmol/L (ref 98–111)
Creatinine, Ser: 2.48 mg/dL — ABNORMAL HIGH (ref 0.61–1.24)
GFR calc Af Amer: 28 mL/min — ABNORMAL LOW (ref >60)
GFR calc non Af Amer: 24 mL/min — ABNORMAL LOW (ref >60)
Glucose, Bld: 95 mg/dL (ref 70–99)
Potassium: 4 mmol/L (ref 3.5–5.1)
Sodium: 137 mmol/L (ref 135–145)

## 2019-04-02 LAB — PHOSPHORUS: Phosphorus: 5 mg/dL — ABNORMAL HIGH (ref 2.5–4.6)

## 2019-04-02 LAB — CULTURE, RESPIRATORY W GRAM STAIN
Culture: NORMAL
Gram Stain: NONE SEEN

## 2019-04-02 LAB — TRIGLYCERIDES: Triglycerides: 75 mg/dL (ref <150)

## 2019-04-02 LAB — PROTIME-INR
INR: 1.3 — ABNORMAL HIGH (ref 0.8–1.2)
Prothrombin Time: 16.1 seconds — ABNORMAL HIGH (ref 11.4–15.2)

## 2019-04-02 LAB — CBC
HCT: 35.1 % — ABNORMAL LOW (ref 39.0–52.0)
Hemoglobin: 11.2 g/dL — ABNORMAL LOW (ref 13.0–17.0)
MCH: 29.5 pg (ref 26.0–34.0)
MCHC: 31.9 g/dL (ref 30.0–36.0)
MCV: 92.4 fL (ref 80.0–100.0)
Platelets: 196 10*3/uL (ref 150–400)
RBC: 3.8 MIL/uL — ABNORMAL LOW (ref 4.22–5.81)
RDW: 16.8 % — ABNORMAL HIGH (ref 11.5–15.5)
WBC: 5.9 10*3/uL (ref 4.0–10.5)
nRBC: 0 % (ref 0.0–0.2)

## 2019-04-02 LAB — GLUCOSE, CAPILLARY
Glucose-Capillary: 102 mg/dL — ABNORMAL HIGH (ref 70–99)
Glucose-Capillary: 115 mg/dL — ABNORMAL HIGH (ref 70–99)
Glucose-Capillary: 71 mg/dL (ref 70–99)
Glucose-Capillary: 89 mg/dL (ref 70–99)
Glucose-Capillary: 91 mg/dL (ref 70–99)
Glucose-Capillary: 94 mg/dL (ref 70–99)
Glucose-Capillary: 94 mg/dL (ref 70–99)

## 2019-04-02 LAB — MAGNESIUM: Magnesium: 1.8 mg/dL (ref 1.7–2.4)

## 2019-04-02 LAB — HEPARIN LEVEL (UNFRACTIONATED): Heparin Unfractionated: 0.37 IU/mL (ref 0.30–0.70)

## 2019-04-02 MED ORDER — DEXTROSE-NACL 5-0.9 % IV SOLN
INTRAVENOUS | Status: AC
  Administered 2019-04-02 (×2): via INTRAVENOUS

## 2019-04-02 MED ORDER — VITAL AF 1.2 CAL PO LIQD
1000.0000 mL | ORAL | Status: DC
  Administered 2019-04-02 – 2019-04-11 (×11): 1000 mL

## 2019-04-02 MED ORDER — PRO-STAT SUGAR FREE PO LIQD
30.0000 mL | Freq: Two times a day (BID) | ORAL | Status: DC
  Administered 2019-04-02 – 2019-04-11 (×18): 30 mL
  Filled 2019-04-02 (×19): qty 30

## 2019-04-02 MED ORDER — CARVEDILOL 3.125 MG PO TABS
3.1250 mg | ORAL_TABLET | Freq: Two times a day (BID) | ORAL | Status: DC
  Administered 2019-04-02 – 2019-04-03 (×2): 3.125 mg via ORAL
  Filled 2019-04-02 (×2): qty 1

## 2019-04-02 NOTE — Progress Notes (Signed)
eLink Physician-Brief Progress Note Patient Name: ANSHUL MEDDINGS DOB: 1940-11-03 MRN: [id number]   Date of Service  04/02/2019  HPI/Events of Note  Pt is hypoglycemic. He is not on enteral feeding yet and has no maintenance iv fluids  eICU Interventions  D5 NS at 75 ml/ hour ordered        Kaydra Borgen U Haydyn Liddell 04/02/2019, 12:26 AM

## 2019-04-02 NOTE — Progress Notes (Signed)
NAME:  [name], MRN:  [id number], DOB:  1940-12-03, LOS: 3 ADMISSION DATE:  03/13/2019, CONSULTATION DATE:  03/13/2019 REFERRING MD:  Forestine Na EM (Dr. Sabra Heck), CHIEF COMPLAINT:  S/p VT and 1x cardioversion  Brief History   78 yo M s/p VT arrest s/p cardioversion x 1 by EMS, unclear if patient was pulseless or had pulse. Intubated at [facility] ED and transferred to [facility]  History of present illness   History obtained per chart review  78 yo M PMH COPD, HTN, CAD with prior MI and s/p CABG 1517, ICM, systolic heart failure, DM2, CKD III (baseline Cr 1.3), hx ureter cancer s/p L nephrectomy, PVD s/p L AKA who presented to [facility] ED 7/25 s/p VT with 1x cardioversion and subsequent conversion to narrow complex tachycardic rhythm. It is unclear if patient lost pulse and ROSC achieved, or if patient retained pulse and was cardioverted x1.  On 7/25 the patient had reportedly "not been feeling well this morning," and EMS was dispatched to patient's home for AMS, altered level of consciousness. Patient has reportedly been increasingly SOB x several days and has been using BDs with increasing frequency. Patient reportedly also with severe lower extremity swelling, extending to groin. Patient reportedly unresponsive on Ems arrival, noted to be in [place]. Unclear if pulse vs pulseless VT. Unclear if cardioversion vs Defib x1 at 120J, with subsequent narrow complex rhythm. Patient required transient BVM ventilation but was reportedly breathing spontaneously. No CPR was required, patient received no other pre-hospital medications.  Upon arrival to [facility] ED, patient was intubated for unresponsiveness. A CVC was placed. Patient given amiodarone and is being transferred to [facility].   SARS-CoV2 Negative 03/15/2019  Past Medical History  COPD DM2 CAD, MI, s/p CABG  Systolic heart failure ICM HTN HLD EtOH abuse FTT PVD  Ureter Cancer s/p L nephrectomy  CKD III   Significant Hospital Events    7/25 s/p VT arrest with ROSC after 1x cardioversion 120J. Intubated at [facility], CVC placed. Transferring to [facility] for ICU admission   Consults:  Vascular surgery  Cardiology   Procedures:  7/25 ETT>> 7/25 CVC >>   Significant Diagnostic Tests:  CXR 7/25> ETT and CVC in adequate position. No pneumothorax. Cardiomegaly. Bilateral pulmonary infiltrates vs edema   CT Head 7/25 > NAICA CT Abdomen/Pelvis 7/25 - Bilateral pleural effusions, bibasilar airspace opacities, cardiomegaly, anasarca, absent left kidney  Micro Data:  7/25 SARS CoV2 > negative  7/26 BCX > 7/26 RCX >   Antimicrobials:  7/25 Rocephin>>   Interim history/subjective:  Increasing oxygen demand overnight.   Objective   Blood pressure 114/68, pulse 84, temperature 99.7 F (37.6 C), resp. rate (!) 23, height 6' (1.829 m), weight 95.1 kg, SpO2 93 %.    Vent Mode: PRVC FiO2 (%):  [40 %-70 %] 40 % Set Rate:  [18 bmp] 18 bmp Vt Set:  [510 mL] 510 mL PEEP:  [5 cmH20] 5 cmH20 Plateau Pressure:  [14 cmH20-18 cmH20] 15 cmH20   Intake/Output Summary (Last 24 hours) at 04/02/2019 0841 Last data filed at 04/02/2019 0756 Gross per 24 hour  Intake 1886.98 ml  Output 1075 ml  Net 811.98 ml   Filed Weights   03/31/19 0348 04/01/19 0400 04/02/19 0407  Weight: 98.1 kg 95.9 kg 95.1 kg   Physical Exam: General: Chronically ill-appearing, sedated HENT: [place], AT, ETT in place Eyes: EOMI, no scleral icterus Respiratory: Anterior rhonchi bilaterally Cardiovascular: RRR, -M/R/G, no JVD GI: BS+, soft,  nontender Extremities: RLE erythema, L AKA-tenderness Neuro: Sedated GU: Foley in place  Resolved Hospital Problem list     Assessment & Plan:   S/p VT arrest with cardioversion, presumed pulseless VT Echo with LVEF 25-30% with RAE and [place] Cardiology consult EP evaluation yesterday recommended stopping amiodarone but to restart if he has recurrence. Will need GDT but no plans to place  ICD now given current condition.  Continue Heparin gtt  Acute on chronic respiratory failure requiring intubation 2/2 pulmonary edema, possible aspiration during arrest COPD  Plan Full vent support. Trail PS today.  Continue Rocephin for suspected aspiration pna F/u blood and trach aspirate, NGTD  PAD protocol for goal RASS 0 and -1: Wean fentanyl and propofol as tolerated VAP  Chronic systolic heart failure (last EF 45-50% in 2018) NICM/CAD/HTN LVEF down to 25-30% Plan Management per cardiology. Will need GDT  Diuresis as tolerated  AoCKD III Hx Ureter cancer s/p L nephrectomy L abdominal protuberance  Baseline Cr approx 1.3. risk for AKI in setting of cardiac arrest Plan Strict I/O Trend UOP/BMP  PVD  RLE acute ischemia  Plan Appreciate vascular surgery consultation. No intervention at this time Continue heparin drip  Midline Sacral Wound -Followed by wound care P WOCN consult for inpatient recs   DM2 Plan SSI  Best practice:  Diet: NPO  Pain/Anxiety/Delirium protocol (if indicated): Fent, Precedex VAP protocol (if indicated): yes  DVT prophylaxis: SCD  GI prophylaxis: Pepcid Glucose control: SSI Mobility: BR  Code Status: Full  Family Communication: Will call to update family today  Disposition: ICU  Labs   CBC: Recent Labs  Lab 03/26/2019 1800  03/31/19 0110 03/31/19 0224 03/31/19 0343 04/01/19 0400 04/02/19 0413  WBC 8.0  --  6.1 6.6  --  7.8 5.9  NEUTROABS 5.6  --  4.7  --   --   --   --   HGB 13.0   < > 11.6* 13.1 12.9* 12.2* 11.2*  HCT 40.5   < > 35.7* 40.0 38.0* 39.0 35.1*  MCV 91.2  --  91.1 91.1  --  93.5 92.4  PLT 289  --  218 228  --  220 196   < > = values in this interval not displayed.    Basic Metabolic Panel: Recent Labs  Lab 03/16/2019 1838  03/31/19 0110 03/31/19 0224 03/31/19 0343 03/31/19 1720 04/01/19 0400 04/02/19 0413  NA 135   < > 136 136 135 137 136 137  K 4.6   < > 4.5 4.7 4.4 5.0 4.5 4.0  CL 105  --  102  102  --  104 103 104  CO2 20*  --  23 21*  --  23 23 20*  GLUCOSE 169*  --  145* 128*  --  101* 90 95  BUN 31*  --  30* 29*  --  33* 36* 44*  CREATININE 1.55*  --  1.77* 1.80*  --  2.16* 2.42* 2.48*  CALCIUM 8.3*  --  8.4* 8.7*  --  8.3* 8.0* 7.8*  MG 2.3  --  2.1 2.1  --   --  2.0 1.8  PHOS  --   --  4.1 3.9  --   --   --   --    < > = values in this interval not displayed.   GFR: Estimated Creatinine Clearance: 30.3 mL/min (A) (by C-G formula based on SCr of 2.48 mg/dL (H)). Recent Labs  Lab 03/31/19 0110 03/31/19 0224  04/01/19 0400 04/02/19 0413  WBC 6.1 6.6 7.8 5.9    Liver Function Tests: Recent Labs  Lab 03/12/2019 1838 03/31/19 0110  AST 24 19  ALT 19 17  ALKPHOS 57 59  BILITOT 0.8 0.6  PROT 5.7* 5.4*  ALBUMIN 2.9* 2.8*   No results for input(s): LIPASE, AMYLASE in the last 168 hours. No results for input(s): AMMONIA in the last 168 hours.  ABG    Component Value Date/Time   PHART 7.443 03/31/2019 0343   PCO2ART 32.7 03/31/2019 0343   PO2ART 71.0 (L) 03/31/2019 0343   HCO3 22.6 03/31/2019 0343   TCO2 24 03/31/2019 0343   ACIDBASEDEF 1.0 03/31/2019 0343   O2SAT 96.0 03/31/2019 0343    Coagulation Profile: Recent Labs  Lab 03/31/19 0110 04/01/19 0400 04/02/19 0413  INR 1.4* 1.4* 1.3*   Cardiac Enzymes: No results for input(s): CKTOTAL, CKMB, CKMBINDEX, TROPONINI in the last 168 hours.  HbA1C: Hgb A1c MFr Bld  Date/Time Value Ref Range Status  05/24/2017 07:04 PM 6.8 (H) 4.8 - 5.6 % Final    Comment:    (NOTE) Pre diabetes:          5.7%-6.4% Diabetes:              >6.4% Glycemic control for   <7.0% adults with diabetes   06/29/2016 08:50 AM 6.2 (H) 4.8 - 5.6 % Final    Comment:    (NOTE)         Pre-diabetes: 5.7 - 6.4         Diabetes: >6.4         Glycemic control for adults with diabetes: <7.0    CBG: Recent Labs  Lab 04/01/19 1942 04/01/19 2016 04/02/19 0004 04/02/19 0356 04/02/19 0742  GLUCAP 69* 96 71 89 94   Ina Homes, MD IMTS PGY3  Pager: [phone]

## 2019-04-02 NOTE — Progress Notes (Signed)
Progress Note  Patient Name: [name] Date of Encounter: 04/02/2019  Primary Cardiologist: new, saw Dr. Ellyn Hack in consult, but from [facility] area  Subjective   Intubated; opens eyes and moves   Inpatient Medications    Scheduled Meds:  aspirin  81 mg Per Tube Daily   chlorhexidine gluconate (MEDLINE KIT)  15 mL Mouth Rinse BID   Chlorhexidine Gluconate Cloth  6 each Topical Daily   famotidine  20 mg Per Tube QHS   insulin aspart  0-15 Units Subcutaneous Q4H   mouth rinse  15 mL Mouth Rinse 10 times per day   pneumococcal 23 valent vaccine  0.5 mL Intramuscular Tomorrow-1000   sodium chloride flush  10-40 mL Intracatheter Q12H   Continuous Infusions:  sodium chloride 10 mL/hr at 04/02/19 0800   cefTRIAXone (ROCEPHIN)  IV 2 g (04/01/19 2207)   dexmedetomidine Stopped (03/31/19 1147)   dextrose 5 % and 0.9% NaCl 75 mL/hr at 04/02/19 0800   fentaNYL infusion INTRAVENOUS 200 mcg/hr (04/02/19 0800)   heparin 1,550 Units/hr (04/02/19 0800)   norepinephrine (LEVOPHED) Adult infusion Stopped (04/01/19 0035)   propofol (DIPRIVAN) infusion 5 mcg/kg/min (04/02/19 0649)   PRN Meds: acetaminophen (TYLENOL) oral liquid 160 mg/5 mL, fentaNYL (SUBLIMAZE) injection, fentaNYL (SUBLIMAZE) injection, sodium chloride flush   Vital Signs    Vitals:   04/02/19 0839 04/02/19 0844 04/02/19 0900 04/02/19 0910  BP:  (!) 100/59 (!) 89/79   Pulse:  93 88   Resp:  20 13   Temp:   99.5 F (37.5 C)   TempSrc:      SpO2: 94% 95% (!) 84% 94%  Weight:      Height:        Intake/Output Summary (Last 24 hours) at 04/02/2019 0928 Last data filed at 04/02/2019 0800 Gross per 24 hour  Intake 1950.62 ml  Output 1060 ml  Net 890.62 ml    I/O since admission: +1150  Filed Weights   03/31/19 0348 04/01/19 0400 04/02/19 0407  Weight: 98.1 kg 95.9 kg 95.1 kg    Telemetry    Sinus rhythm in the 90 - Personally Reviewed  Had some PAF  Since amiodarone was dc'd  ECG    ECG  (independently read by me): NSR at 74; Ist degree AV block; PR 246 msec; NST changes  Physical Exam   BP (!) 89/79    Pulse 88    Temp 99.5 F (37.5 C)    Resp 13    Ht 6' (1.829 m)    Wt 95.1 kg    SpO2 94%    BMI 28.43 kg/m    BP now 105/62; P 91 General: has writhing movements Skin: normal turgor, no rashes, warm and dry HEENT: Normocephalic, atraumatic. Pupils equal round and reactive to light; sclera anicteric; extraocular muscles intact;  Nose without nasal septal hypertrophy Mouth/Parynx  intubated Neck: No JVD, no carotid bruits; normal carotid upstroke Lungs: clear to ausculatation and percussion; no wheezing or rales Chest wall: without tenderness to palpitation Heart: PMI not displaced, RRR, s1 s2 normal, 1/6 systolic murmur, no diastolic murmur, no rubs, gallops, thrills, or heaves Abdomen: soft, nontender; no hepatosplenomehaly, BS+; abdominal aorta nontender and not dilated by palpation. Back: no CVA tenderness Pulses 2+ Musculoskeletal: full range of motion, normal strength, no joint deformities Extremities: L AKA; cold ischemic RLE Neurologic: grossly nonfocal; Cranial nerves grossly wnl   Labs    Chemistry Recent Labs  Lab 03/20/2019 1838  03/31/19 0110  03/31/19 1720  04/01/19 0400 04/02/19 0413  NA 135   < > 136   < > 137 136 137  K 4.6   < > 4.5   < > 5.0 4.5 4.0  CL 105  --  102   < > 104 103 104  CO2 20*  --  23   < > 23 23 20*  GLUCOSE 169*  --  145*   < > 101* 90 95  BUN 31*  --  30*   < > 33* 36* 44*  CREATININE 1.55*  --  1.77*   < > 2.16* 2.42* 2.48*  CALCIUM 8.3*  --  8.4*   < > 8.3* 8.0* 7.8*  PROT 5.7*  --  5.4*  --   --   --   --   ALBUMIN 2.9*  --  2.8*  --   --   --   --   AST 24  --  19  --   --   --   --   ALT 19  --  17  --   --   --   --   ALKPHOS 57  --  59  --   --   --   --   BILITOT 0.8  --  0.6  --   --   --   --   GFRNONAA 43*  --  36*   < > 29* 25* 24*  GFRAA 50*  --  42*   < > 33* 29* 28*  ANIONGAP 10  --  11   < > _0 < > = values in this interval not displayed.     Hematology Recent Labs  Lab 03/31/19 0224 03/31/19 0343 04/01/19 0400 04/02/19 0413  WBC 6.6  --  7.8 5.9  RBC 4.39  --  4.17* 3.80*  HGB 13.1 12.9* 12.2* 11.2*  HCT 40.0 38.0* 39.0 35.1*  MCV 91.1  --  93.5 92.4  MCH 29.8  --  29.3 29.5  MCHC 32.8  --  31.3 31.9  RDW 16.8*  --  17.0* 16.8*  PLT 228  --  220 196    Cardiac EnzymesNo results for input(s): TROPONINI in the last 168 hours. No results for input(s): TROPIPOC in the last 168 hours.   BNP Recent Labs  Lab 03/07/2019 1800 03/31/19 0224  BNP 483.0* 634.4*     DDimer No results for input(s): DDIMER in the last 168 hours.   Lipid Panel     Component Value Date/Time   CHOL  09/02/2010 0531    175        ATP III CLASSIFICATION:  <200     mg/dL   Desirable  200-239  mg/dL   Borderline High  >=240    mg/dL   High          TRIG 75 04/02/2019 0413   HDL 71 09/02/2010 0531   CHOLHDL 2.5 09/02/2010 0531   VLDL 38 09/02/2010 0531   LDLCALC  09/02/2010 0531    66        Total Cholesterol/HDL:CHD Risk Coronary Heart Disease Risk Table                     Men   Women  1/2 Average Risk   3.4   3.3  Average Risk       5.0   4.4  2 X Average Risk   9.6   7.1  3 X  Average Risk  23.4   11.0        Use the calculated Patient Ratio above and the CHD Risk Table to determine the patient's CHD Risk.        ATP III CLASSIFICATION (LDL):  <100     mg/dL   Optimal  100-129  mg/dL   Near or Above                    Optimal  130-159  mg/dL   Borderline  160-189  mg/dL   High  >190     mg/dL   Very High       Radiology    Dg Chest Port 1 View  Result Date: 04/01/2019 CLINICAL DATA:  ETT placement.  History of diabetes, hypertension. EXAM: PORTABLE CHEST 1 VIEW COMPARISON:  Chest radiograph 03/21/2019, CT abdomen/pelvis 03/31/2019. FINDINGS: Single-view AP semi upright chest radiograph. The endotracheal tube terminates approximately 5 cm above the carina. An  enteric tube courses below the level of the left hemidiaphragm and is cut from the field of view. Unchanged position of a right IJ approach central venous catheter. Sequela of prior median sternotomy. Cardiomegaly. Atherosclerotic calcification of the aorta. Interval increase in size of a right pleural effusion. Interval increase in right basilar and right perihilar opacities. Mild left basilar atelectasis. A small left pleural effusion was better appreciated on recent prior CT abdomen/pelvis. No evidence of pneumothorax. IMPRESSION: ET tube terminates approximately 5 cm above the carina. Additional lines and tubes as detailed. Interval increase in right pleural effusion. Interval increase in right basilar and right perihilar opacities which may reflect atelectasis and/or developing pneumonia. Mild left basilar atelectasis. A small left pleural effusion was better appreciated on recent prior CT abdomen/pelvis. Electronically Signed   By: Kellie Simmering   On: 04/01/2019 08:38    Cardiac Studies   ECHO IMPRESSIONS: 03/31/2019  1. The left ventricle has severely reduced systolic function, with an ejection fraction of 25-30%. The cavity size was normal. Indeterminate diastolic filling due to E-A fusion.  2. LVEF is severely depressed; proximal portions of LV move the best but are not normal.  3. The right ventricle has normal systolic function. The cavity was mildly enlarged. There is no increase in right ventricular wall thickness.  4. Left atrial size was mildly dilated.  5. Right atrial size was moderately dilated.  6. The mitral valve is grossly normal. There is mild mitral annular calcification present. Mitral valve regurgitation is mild to moderate by color flow Doppler.  7. Tricuspid valve regurgitation is moderate.  8. The inferior vena cava was dilated in size with <50% respiratory variability.  9. The left ventricular function has worsened.  Patient Profile     [name] is a 78 y.o. male  with a hx of CAD-prior MI with CABG (per family CABG x5 was in 2004 -[place] -by report, has had at least one catheterization prior to his AKA with no stents.) resulting in ischemic cardiomyopathy with chronic combined systolic and diastolic heart failure), COPD, PAD (right SFA stent, left AKA), hypertension who suffered a cardiac arrest 03/17/2019.  Assessment & Plan    1. Day 3 s/p VT cardiac arrest with 1 shock and restoration of ROSC. Will need future ICD for secondary prevention for subsequent cardiac arrest.  2. Combined cardiomyopathy:  EF 25- 30%: currently  EF prior to arrest not yet known.  Levophed off.   BNP 634 on 7/26.  Will add  carvedilol 3.125 mg bid today  3. Cardiogenic shock:  Was on levophed; weaned and dc'd;  BP now 105/62.  Will add carvedilol today. Not candidate for ACE-I/ARB/ spiro with Cr, but if further stabiles can add hydralazine.  4.  CAD/ s/p CABG in [place], [place]. Will need to obtain data.  Does not appear to be an acute MI/STEMI.  HS Troponin is low at 89.  Depending on neurologic function and future PVD surgery, will need future ischemic evaluation, but with only one kidney and Cr increasing would not give contrast presently.  5. PAF: Amiodarone was dc'd by EP yesterday. Now in sinus, but had PAF post dc amiodarone.  Will add carvedilol now; if recurrent AF re-initiate amiodarone.   5. PVD: prior R AKA, now with ischemic cold R foot with non-retrievable capillary bed, felt to be non-salvageable and most likely will nee R AKA. Risk is high but agree may need to be done due to ongoing risk of ischemic leg  6. AKI: Previous left nephrectomy with baseline Stage 3 CKD.  Cr increased today to 2.48 (1.55 on 7/25)  7. Respiratory Failure: intubated followed by CCM  CC: 40 minutes   Signed, Troy Sine, MD, [facility] 04/02/2019, 9:28 AM

## 2019-04-02 NOTE — Progress Notes (Signed)
   VASCULAR SURGERY ASSESSMENT & PLAN:   ISCHEMIC RIGHT LOWER EXTREMITY: The ischemic changes to the right lower extremity continue to progress.  He does have a monophasic distal anterior tibial signal with the Doppler.  I have again updated the family and also spoke to critical care medicine.  I think we can proceed with right above-the-knee amputation whenever it is felt that he has been optimized from a medical standpoint.  Clearly he will be at increased risk for any surgery, however eventually I think the ischemic right lower extremity will complicate his care.  We will continue to follow.  SUBJECTIVE:   Sedated on the vent.  PHYSICAL EXAM:   Vitals:   04/02/19 0800 04/02/19 0839 04/02/19 0844 04/02/19 0910  BP: 114/68  (!) 100/59   Pulse: 94  93   Resp: 15  20   Temp: 99.7 F (37.6 C)     TempSrc: Bladder     SpO2: 93% 94% 95% 94%  Weight:      Height:       Ischemic changes to right lower extremity are slowly progressing. He does have a monophasic distal anterior tibial signal.  LABS:   Lab Results  Component Value Date   WBC 5.9 04/02/2019   HGB 11.2 (L) 04/02/2019   HCT 35.1 (L) 04/02/2019   MCV 92.4 04/02/2019   PLT 196 04/02/2019   Lab Results  Component Value Date   CREATININE 2.48 (H) 04/02/2019   Lab Results  Component Value Date   INR 1.3 (H) 04/02/2019   CBG (last 3)  Recent Labs    04/02/19 0004 04/02/19 0356 04/02/19 0742  GLUCAP 71 89 94    PROBLEM LIST:    Principal Problem:   Cardiac arrest (HCC) Active Problems:   ACC/AHA stage B congestive heart failure due to ischemic cardiomyopathy (HCC)   Peripheral vascular disease (HCC)   Acute respiratory failure with hypercapnia (HCC)   Coronary artery disease involving native coronary artery of native heart with angina pectoris (HCC)   Diabetes mellitus with peripheral vascular disease ([place])   Tobacco abuse   Ventricular tachycardia (HCC)   Acute on chronic combined systolic and diastolic  CHF (congestive heart failure) (HCC)   Demand ischemia (HCC)   CURRENT MEDS:   . aspirin  81 mg Per Tube Daily  . chlorhexidine gluconate (MEDLINE KIT)  15 mL Mouth Rinse BID  . Chlorhexidine Gluconate Cloth  6 each Topical Daily  . famotidine  20 mg Per Tube QHS  . insulin aspart  0-15 Units Subcutaneous Q4H  . mouth rinse  15 mL Mouth Rinse 10 times per day  . pneumococcal 23 valent vaccine  0.5 mL Intramuscular Tomorrow-1000  . sodium chloride flush  10-40 mL Intracatheter Q12H    [name] Beeper: [phone] Office: [phone] 04/02/2019

## 2019-04-02 NOTE — Progress Notes (Signed)
Nutrition Follow-up  DOCUMENTATION CODES:   Not applicable  INTERVENTION:   Tube feeding:  -Vital AF 1.2 @ 60 ml/hr -30 ml Prostat BID  Provides: 1928 kcals (2010 kcal with propofol), 138 grams protein, 1167 ml free water. Meets 100% of needs.   NUTRITION DIAGNOSIS:   Inadequate oral intake related to inability to eat as evidenced by NPO status.  Ongoing  GOAL:   Patient will meet greater than or equal to 90% of their needs  Addressed via TF  MONITOR:   Vent status, Labs, Weight trends, I & O's, Skin  REASON FOR ASSESSMENT:   Ventilator    ASSESSMENT:   78 yo M PMH COPD, HTN, CAD with prior MI and s/p CABG 5974, ICM, systolic heart failure, DM2, CKD III (baseline Cr 1.3), hx ureter cancer s/p L nephrectomy, PVD s/p L AKA who [facility] ED [facility]25 s/p VT with 1x cardioversion and subsequent conversion to narrow complex tachycardic rhythm.   Pt discussed during ICU rounds and with RN.   Failed weaning trial this am. Off pressors. Propofol being weaned. Plan for R AKA and ICD- pending assessment of neurological function. CBG continue to be decreased. Start TF today.   Admission weight: 98 kg Current weight: 95.1 kg   RLE shows to have +2 pitting edema per nursing documentation.   Patient remains intubated on ventilator support MV: 9.3 L/min Temp (24hrs), Avg:99.6 F (37.6 C), Min:99.3 F (37.4 C), Max:99.9 F (37.7 C)  Propofol: 3.1 ml/hr- provides 82 kcal daily  I/O: +1,701 ml since admit UOP: 540 ml x 24 hrs  OGT: 500 ml x 24 hrs   Drips: propofol Medications: SS novolog Labs: Cr 2.48 CBG 57-102  Diet Order:   Diet Order            Diet NPO time specified  Diet effective now              EDUCATION NEEDS:   Not appropriate for education at this time  Skin:  Skin Assessment: Skin Integrity Issues: Skin Integrity Issues:: Stage II Stage II: coccyx  Last BM:  PTA  Height:   Ht Readings from Last 1 Encounters:  03/31/19 6'  (1.829 m)    Weight:   Wt Readings from Last 1 Encounters:  04/02/19 95.1 kg    Ideal Body Weight:  72.8 kg(adjusted for Lt AKA)  BMI:  Body mass index is 28.43 kg/m.  Estimated Nutritional Needs:   Kcal:  2006 kcal  Protein:  135-155 grams  Fluid:  >/= 2 L/day   Mariana Single RD, LDN Clinical Nutrition Pager # - [phone]

## 2019-04-02 NOTE — Progress Notes (Signed)
ANTICOAGULATION CONSULT NOTE - [place] for heparin Indication: chest pain/ACS  Allergies  Allergen Reactions  . Erythromycin Anaphylaxis and Rash  . Iohexol Anaphylaxis, Hives, Itching and Other (See Comments)    Note:  13 HR PRE-MED GIVEN AND SUCCESSFUL-ARS 07/15/07.     Marland Kitchen Penicillins Anaphylaxis, Itching and Other (See Comments)    Has patient had a PCN reaction causing immediate rash, facial/tongue/throat swelling, SOB or lightheadedness with hypotension: Yes Has patient had a PCN reaction causing severe rash involving mucus membranes or skin necrosis: No Has patient had a PCN reaction that required hospitalization No Has patient had a PCN reaction occurring within the last 10 years: No If all of the above answers are "NO", then may proceed with Cephalosporin use.    Patient Measurements: Height: 6' (182.9 cm) Weight: 209 lb 10.5 oz (95.1 kg) IBW/kg (Calculated) : 77.6 Heparin Dosing Weight: 97.3 kg  Vital Signs: Temp: 99.7 F (37.6 C) (07/28 0800) Temp Source: Bladder (07/28 0400) BP: 114/68 (07/28 0800) Pulse Rate: 84 (07/28 0800)  Labs: Recent Labs    03/31/19 0110 03/31/19 0224 03/31/19 0343 03/31/19 1037  03/31/19 1720 03/31/19 2300 04/01/19 0400 04/01/19 0920 04/02/19 0413  HGB 11.6* 13.1 12.9*  --   --   --   --  12.2*  --  11.2*  HCT 35.7* 40.0 38.0*  --   --   --   --  39.0  --  35.1*  PLT 218 228  --   --   --   --   --  220  --  196  APTT 36  --   --   --   --   --   --   --   --   --   LABPROT 16.7*  --   --   --   --   --   --  16.7*  --  16.1*  INR 1.4*  --   --   --   --   --   --  1.4*  --  1.3*  HEPARINUNFRC  --   --   --   --    < >  --  0.21*  --  0.48 0.37  CREATININE 1.77* 1.80*  --   --   --  2.16*  --  2.42*  --  2.48*  TROPONINIHS 76* 79*  --  86*  --  89* 88*  --   --   --    < > = values in this interval not displayed.    Estimated Creatinine Clearance: 30.3 mL/min (A) (by C-G formula based on SCr of 2.48  mg/dL (H)).   Medical History: Past Medical History:  Diagnosis Date  . Arthritis    "qwhere" (07/09/2013)  . Asthma   . COPD (chronic obstructive pulmonary disease) ([place])    pt denies this hx on 07/09/2013; has chronic dyspnea  . Coronary artery disease    Reportedly multivessel disease-CABG  . Gout   . H/O hiatal hernia 1997  . H/O: rheumatic fever    "had rheumatic fever as a kid" (07/09/2013) -has murmur  . Hyperlipidemia with target LDL less than 70   . Hypertension   . Ischemic cardiomyopathy    By report,-ICD  . Myocardial infarction ([place]) 03/2002; ~ 2011  . Neuropathy   . PAD (peripheral artery disease) ([place]) 2014   Right SFA stent November 2014; status post left AKA  . Pneumonia    "once" (  07/09/2013)  . Renal disorder    "only have 1 kidney; due to go back soon to check the other one" (07/09/2013)  . RUPTURE ROTATOR CUFF 12/03/2009   Qualifier: Diagnosis of  By: Aline Brochure MD, Dorothyann Peng    . Type II diabetes mellitus ([place])   . Ureter cancer ([place]) 12/2001   "shut down my kidney & resulted in nephrectomy" (07/09/2013)  . UTI (urinary tract infection) 10/04/2018    Assessment: 78 yo male with VT and concern for ACS (hx MI and CABG) also with ischemic right foot that will most likely  require R AKA. Plan for cardiac cath when stable. Pharmacy consulted for heparin for ACS. Heparin level 0.37 at goal on heparin 1550 units/hr. Hgb 11.2. Plt WNL. No reported bleeding.   Goal of Therapy:  Heparin level 0.3-0.7 units/ml Monitor platelets by anticoagulation protocol: Yes   Plan:  Continue heparin 1550 units/hr  Monitor daily heparin level, CBC, and S/S of bleeding   Cristela Felt, PharmD PGY1 Pharmacy Resident [employer]: [phone]

## 2019-04-02 NOTE — Progress Notes (Signed)
[facility] Progress Note Patient Name: [name] DOB: [birth year] MRN: [id number]   Date of Service  04/02/2019  HPI/Events of Note  Restraints need renewal  eICU Interventions  Order to renew restraints entered.        [name] 04/02/2019, 1:33 AM

## 2019-04-02 NOTE — Progress Notes (Signed)
Attempted to called patient's wife, Edem Tiegs, to provide an update on the current status and plan for Mr. Crossley. Tried both home and cell phone numbers listed in the chart.   Ina Homes, MD IMTS PGY 3  Pager: [phone]

## 2019-04-03 ENCOUNTER — Inpatient Hospital Stay (HOSPITAL_COMMUNITY): Payer: Medicare HMO

## 2019-04-03 ENCOUNTER — Ambulatory Visit: Payer: Medicare HMO | Admitting: Internal Medicine

## 2019-04-03 DIAGNOSIS — I493 Ventricular premature depolarization: Secondary | ICD-10-CM

## 2019-04-03 LAB — CBC
HCT: 36.6 % — ABNORMAL LOW (ref 39.0–52.0)
HCT: 42.3 % (ref 39.0–52.0)
Hemoglobin: 11.8 g/dL — ABNORMAL LOW (ref 13.0–17.0)
Hemoglobin: 13.5 g/dL (ref 13.0–17.0)
MCH: 29.8 pg (ref 26.0–34.0)
MCH: 29.3 pg (ref 26.0–34.0)
MCHC: 32.2 g/dL (ref 30.0–36.0)
MCHC: 31.9 g/dL (ref 30.0–36.0)
MCV: 92.4 fL (ref 80.0–100.0)
MCV: 92 fL (ref 80.0–100.0)
Platelets: 213 10*3/uL (ref 150–400)
Platelets: 201 10*3/uL (ref 150–400)
RBC: 3.96 MIL/uL — ABNORMAL LOW (ref 4.22–5.81)
RBC: 4.6 MIL/uL (ref 4.22–5.81)
RDW: 17 % — ABNORMAL HIGH (ref 11.5–15.5)
RDW: 17.1 % — ABNORMAL HIGH (ref 11.5–15.5)
WBC: 6.3 10*3/uL (ref 4.0–10.5)
WBC: 8.3 10*3/uL (ref 4.0–10.5)
nRBC: 0 % (ref 0.0–0.2)
nRBC: 0 % (ref 0.0–0.2)

## 2019-04-03 LAB — POCT I-STAT 7, (LYTES, BLD GAS, ICA,H+H)
Acid-base deficit: 9 mmol/L — ABNORMAL HIGH (ref 0.0–2.0)
Acid-base deficit: 7 mmol/L — ABNORMAL HIGH (ref 0.0–2.0)
Bicarbonate: 20.7 mmol/L (ref 20.0–28.0)
Bicarbonate: 21.5 mmol/L (ref 20.0–28.0)
Calcium, Ion: 1.06 mmol/L — ABNORMAL LOW (ref 1.15–1.40)
Calcium, Ion: 1.08 mmol/L — ABNORMAL LOW (ref 1.15–1.40)
HCT: 43 % (ref 39.0–52.0)
HCT: 42 % (ref 39.0–52.0)
Hemoglobin: 14.6 g/dL (ref 13.0–17.0)
Hemoglobin: 14.3 g/dL (ref 13.0–17.0)
O2 Saturation: 99 %
O2 Saturation: 99 %
Patient temperature: 37.5
Patient temperature: 37.4
Potassium: 4.1 mmol/L (ref 3.5–5.1)
Potassium: 4.1 mmol/L (ref 3.5–5.1)
Sodium: 141 mmol/L (ref 135–145)
Sodium: 140 mmol/L (ref 135–145)
TCO2: 22 mmol/L (ref 22–32)
TCO2: 23 mmol/L (ref 22–32)
pCO2 arterial: 59 mmHg — ABNORMAL HIGH (ref 32.0–48.0)
pCO2 arterial: 52.4 mmHg — ABNORMAL HIGH (ref 32.0–48.0)
pH, Arterial: 7.157 — CL (ref 7.350–7.450)
pH, Arterial: 7.223 — ABNORMAL LOW (ref 7.350–7.450)
pO2, Arterial: 155 mmHg — ABNORMAL HIGH (ref 83.0–108.0)
pO2, Arterial: 170 mmHg — ABNORMAL HIGH (ref 83.0–108.0)

## 2019-04-03 LAB — PROTIME-INR
INR: 1.3 — ABNORMAL HIGH (ref 0.8–1.2)
Prothrombin Time: 15.8 seconds — ABNORMAL HIGH (ref 11.4–15.2)

## 2019-04-03 LAB — PHOSPHORUS
Phosphorus: 5 mg/dL — ABNORMAL HIGH (ref 2.5–4.6)
Phosphorus: 6 mg/dL — ABNORMAL HIGH (ref 2.5–4.6)

## 2019-04-03 LAB — COOXEMETRY PANEL
Carboxyhemoglobin: 0.5 % (ref 0.5–1.5)
Methemoglobin: 0.9 % (ref 0.0–1.5)
O2 Saturation: 95.6 %
Total hemoglobin: 13.1 g/dL (ref 12.0–16.0)

## 2019-04-03 LAB — HEPARIN LEVEL (UNFRACTIONATED): Heparin Unfractionated: 0.33 IU/mL (ref 0.30–0.70)

## 2019-04-03 LAB — BASIC METABOLIC PANEL
Anion gap: 10 (ref 5–15)
BUN: 50 mg/dL — ABNORMAL HIGH (ref 8–23)
CO2: 20 mmol/L — ABNORMAL LOW (ref 22–32)
Calcium: 7.9 mg/dL — ABNORMAL LOW (ref 8.9–10.3)
Chloride: 107 mmol/L (ref 98–111)
Creatinine, Ser: 2.47 mg/dL — ABNORMAL HIGH (ref 0.61–1.24)
GFR calc Af Amer: 28 mL/min — ABNORMAL LOW (ref >60)
GFR calc non Af Amer: 24 mL/min — ABNORMAL LOW (ref >60)
Glucose, Bld: 150 mg/dL — ABNORMAL HIGH (ref 70–99)
Potassium: 4.3 mmol/L (ref 3.5–5.1)
Sodium: 137 mmol/L (ref 135–145)

## 2019-04-03 LAB — GLUCOSE, CAPILLARY
Glucose-Capillary: 101 mg/dL — ABNORMAL HIGH (ref 70–99)
Glucose-Capillary: 119 mg/dL — ABNORMAL HIGH (ref 70–99)
Glucose-Capillary: 123 mg/dL — ABNORMAL HIGH (ref 70–99)
Glucose-Capillary: 132 mg/dL — ABNORMAL HIGH (ref 70–99)
Glucose-Capillary: 139 mg/dL — ABNORMAL HIGH (ref 70–99)
Glucose-Capillary: 143 mg/dL — ABNORMAL HIGH (ref 70–99)

## 2019-04-03 MED ORDER — MAGNESIUM SULFATE IN D5W 1-5 GM/100ML-% IV SOLN
1.0000 g | Freq: Once | INTRAVENOUS | Status: AC
  Administered 2019-04-03: 11:00:00 1 g via INTRAVENOUS
  Filled 2019-04-03: qty 100

## 2019-04-03 MED ORDER — SODIUM BICARBONATE 8.4 % IV SOLN
INTRAVENOUS | Status: DC
  Administered 2019-04-03 – 2019-04-06 (×5): via INTRAVENOUS
  Filled 2019-04-03 (×6): qty 150

## 2019-04-03 MED ORDER — SODIUM BICARBONATE 8.4 % IV SOLN
INTRAVENOUS | Status: AC
  Administered 2019-04-03: 22:00:00 100 meq via INTRAVENOUS
  Filled 2019-04-03: qty 150

## 2019-04-03 MED ORDER — SODIUM BICARBONATE 8.4 % IV SOLN
100.0000 meq | Freq: Once | INTRAVENOUS | Status: AC
  Administered 2019-04-03: 22:00:00 100 meq via INTRAVENOUS

## 2019-04-03 MED ORDER — SODIUM BICARBONATE 4.2 % IV SOLN: 50.0000 meq | Freq: Once | INTRAVENOUS | Status: DC

## 2019-04-03 MED ORDER — CALCIUM GLUCONATE-NACL 1-0.675 GM/50ML-% IV SOLN
1.0000 g | Freq: Once | INTRAVENOUS | Status: AC
  Administered 2019-04-03: 23:00:00 1000 mg via INTRAVENOUS
  Filled 2019-04-03: qty 50

## 2019-04-03 MED ORDER — VASOPRESSIN 20 UNIT/ML IV SOLN
0.0300 [IU]/min | INTRAVENOUS | Status: DC
  Filled 2019-04-03: qty 2

## 2019-04-03 MED ORDER — POTASSIUM CHLORIDE 20 MEQ/15ML (10%) PO SOLN
40.0000 meq | Freq: Once | ORAL | Status: AC
  Administered 2019-04-04: 02:00:00 40 meq
  Filled 2019-04-03: qty 30

## 2019-04-03 MED ORDER — FUROSEMIDE 10 MG/ML IJ SOLN
40.0000 mg | Freq: Once | INTRAMUSCULAR | Status: AC
  Administered 2019-04-03: 40 mg via INTRAVENOUS
  Filled 2019-04-03: qty 4

## 2019-04-03 MED ORDER — METRONIDAZOLE IN NACL 5-0.79 MG/ML-% IV SOLN
500.0000 mg | Freq: Three times a day (TID) | INTRAVENOUS | Status: DC
  Administered 2019-04-03 – 2019-04-04 (×4): 500 mg via INTRAVENOUS
  Filled 2019-04-03 (×4): qty 100

## 2019-04-03 MED ORDER — EPINEPHRINE 1 MG/10ML IJ SOSY
PREFILLED_SYRINGE | INTRAMUSCULAR | Status: AC
  Filled 2019-04-03: qty 10

## 2019-04-03 MED ORDER — SODIUM CHLORIDE 0.9 % IV SOLN
2.0000 g | INTRAVENOUS | Status: AC
  Administered 2019-04-03 – 2019-04-05 (×3): 2 g via INTRAVENOUS
  Filled 2019-04-03 (×3): qty 20

## 2019-04-03 MED ORDER — SODIUM BICARBONATE 8.4 % IV SOLN
50.0000 meq | Freq: Once | INTRAVENOUS | Status: AC
  Administered 2019-04-03: 22:00:00 50 meq via INTRAVENOUS

## 2019-04-03 MED ORDER — CHLORHEXIDINE GLUCONATE CLOTH 2 % EX PADS
6.0000 | MEDICATED_PAD | Freq: Every day | CUTANEOUS | Status: DC
  Administered 2019-04-03 – 2019-04-09 (×5): 6 via TOPICAL

## 2019-04-03 MED ORDER — AMIODARONE HCL 200 MG PO TABS
400.0000 mg | ORAL_TABLET | Freq: Two times a day (BID) | ORAL | Status: DC
  Administered 2019-04-03 – 2019-04-04 (×2): 400 mg via ORAL
  Filled 2019-04-03 (×3): qty 2

## 2019-04-03 NOTE — Progress Notes (Signed)
PCCM Interval Progress Note  Called to bedside urgently due to hypotension with SBP in 40's and 50's despite 73mcg/kg/min levophed.  Pt being bagged by RT due to desaturations in 60's despite 100% FiO2 and 10 PEEP.  Radial arterial line attempted by RT but no radial pulses appreciated and unsuccessful.  I therefore placed right femoral arterial line emergently.  While placing art line, Levophed increased to 39mcg/kg/min, vasopression started, and 3 amps HCO3 given.  SBP improved to 90's shortly after HCO3.  HCO3 infusion ordered at 125cc/hr.  BP on art line now reading 154/81.  Vasopressin stopped and RN asked to wean levophed back down.  ABG 7.157 / 58 / 155 / 22.  Pt placed back on full vent support (PRVC 100 / 5 / 20 510).  RT asked to repeat ABG at 2300 and wean PEEP / FiO2 back down as able.   Montey Hora, [facility] Pager: [phone].  If no answer, [phone] 04/03/2019, 10:29 PM

## 2019-04-03 NOTE — Procedures (Signed)
Arterial Catheter Insertion Procedure Note [name] [id number] 1941/05/17  Procedure: Insertion of Arterial Catheter  Indications: Blood pressure monitoring and Frequent blood sampling  Procedure Details Consent: Unable to obtain consent because of emergent medical necessity. Time Out: Verified patient identification, verified procedure, site/side was marked, verified correct patient position, special equipment/implants available, medications/allergies/relevent history reviewed, required imaging and test results available.  Performed  Maximum sterile technique was used including antiseptics, cap, gloves, gown, hand hygiene, mask and sheet. Skin prep: Chlorhexidine; local anesthetic administered 20 gauge catheter was inserted into right femoral artery using the Seldinger technique. ULTRASOUND GUIDANCE USED: YES Evaluation Blood flow good; BP tracing good. Complications: No apparent complications.  Procedure performed under direct ultrasound guidance for real time vessel cannulation.     [name] Shearon Stalls 04/03/2019

## 2019-04-03 NOTE — Progress Notes (Signed)
Attempted to called patient's wife, Saad Buhl, to provide an update on the current status and plan for Mr. Eckhardt. Tried both home and cell phone numbers listed in the chart.   Ina Homes, MD IMTS PGY 3  Pager: [phone]

## 2019-04-03 NOTE — Progress Notes (Signed)
Attempted to wean sedation down. Fent 25 Prop off. Pt wakes up is restless, flails head back and forth, and tongues out ETT. Noted to be at 24 not at 64. RN resumed sedation medications and notified RRT. Sats remaining 94-95.

## 2019-04-03 NOTE — Progress Notes (Signed)
ANTICOAGULATION CONSULT NOTE - [place] for heparin Indication: chest pain/ACS  Allergies  Allergen Reactions  . Erythromycin Anaphylaxis and Rash  . Iohexol Anaphylaxis, Hives, Itching and Other (See Comments)    Note:  13 HR PRE-MED GIVEN AND SUCCESSFUL-ARS 07/15/07.     Marland Kitchen Penicillins Anaphylaxis, Itching and Other (See Comments)    Has patient had a PCN reaction causing immediate rash, facial/tongue/throat swelling, SOB or lightheadedness with hypotension: Yes Has patient had a PCN reaction causing severe rash involving mucus membranes or skin necrosis: No Has patient had a PCN reaction that required hospitalization No Has patient had a PCN reaction occurring within the last 10 years: No If all of the above answers are "NO", then may proceed with Cephalosporin use.    Patient Measurements: Height: 6' (182.9 cm) Weight: 214 lb 4.6 oz (97.2 kg) IBW/kg (Calculated) : 77.6 Heparin Dosing Weight: 97.3 kg  Vital Signs: Temp: 100.6 F (38.1 C) (07/29 0743) Temp Source: Core (07/29 0356) BP: 95/64 (07/29 0743) Pulse Rate: 91 (07/29 0743)  Labs: Recent Labs    03/31/19 1037  03/31/19 1720 03/31/19 2300  04/01/19 0400 04/01/19 0920 04/02/19 0413 04/03/19 0358  HGB  --   --   --   --    < > 12.2*  --  11.2* 11.8*  HCT  --   --   --   --   --  39.0  --  35.1* 36.6*  PLT  --   --   --   --   --  220  --  196 213  LABPROT  --   --   --   --   --  16.7*  --  16.1* 15.8*  INR  --   --   --   --   --  1.4*  --  1.3* 1.3*  HEPARINUNFRC  --    < >  --  0.21*  --   --  0.48 0.37 0.33  CREATININE  --    < > 2.16*  --   --  2.42*  --  2.48* 2.47*  TROPONINIHS 86*  --  89* 88*  --   --   --   --   --    < > = values in this interval not displayed.    Estimated Creatinine Clearance: 30.7 mL/min (A) (by C-G formula based on SCr of 2.47 mg/dL (H)).   Medical History: Past Medical History:  Diagnosis Date  . Arthritis    "qwhere" (07/09/2013)  . Asthma    . COPD (chronic obstructive pulmonary disease) ([place])    pt denies this hx on 07/09/2013; has chronic dyspnea  . Coronary artery disease    Reportedly multivessel disease-CABG  . Gout   . H/O hiatal hernia 1997  . H/O: rheumatic fever    "had rheumatic fever as a kid" (07/09/2013) -has murmur  . Hyperlipidemia with target LDL less than 70   . Hypertension   . Ischemic cardiomyopathy    By report,-ICD  . Myocardial infarction ([place]) 03/2002; ~ 2011  . Neuropathy   . PAD (peripheral artery disease) ([place]) 2014   Right SFA stent November 2014; status post left AKA  . Pneumonia    "once" (07/09/2013)  . Renal disorder    "only have 1 kidney; due to go back soon to check the other one" (07/09/2013)  . RUPTURE ROTATOR CUFF 12/03/2009   Qualifier: Diagnosis of  By: Aline Brochure MD, Dorothyann Peng    .  Type II diabetes mellitus ([place])   . 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 UTI (urinary tract infection) 10/04/2018    Assessment: 78 yo male with VT and concern for ACS (hx MI and CABG) also with ischemic right foot that will most likely  require R AKA. Pharmacy consulted for heparin for ACS. Heparin level 0.33 at goal on heparin 1550 units/hr. Heparin levels slightly downtrending over past few days. Hgb 11.8. Plt WNL. No reported bleeding.   Goal of Therapy:  Heparin level 0.3-0.7 units/ml Monitor platelets by anticoagulation protocol: Yes   Plan:  Increase heparin to 1600 units/hr to ensure patient stays in therapeutic range  Check next heparin level with AM labs on 7/30 Monitor daily heparin level, CBC, and S/S of bleeding   Cristela Felt, PharmD PGY1 Pharmacy Resident [employer]: [phone]

## 2019-04-03 NOTE — Progress Notes (Signed)
NAME:  [name], MRN:  [id number], DOB:  29-Nov-1940, LOS: 4 ADMISSION DATE:  03/15/2019, CONSULTATION DATE:  03/19/2019 REFERRING MD:  Forestine Na EM (Dr. Sabra Heck), CHIEF COMPLAINT:  S/p VT and 1x cardioversion  Brief History   78 yo M s/p VT arrest s/p cardioversion x 1 by EMS, unclear if patient was pulseless or had pulse. Intubated at [facility] ED and transferred to [facility]  History of present illness   History obtained per chart review  78 yo M PMH COPD, HTN, CAD with prior MI and s/p CABG 9798, ICM, systolic heart failure, DM2, CKD III (baseline Cr 1.3), hx ureter cancer s/p L nephrectomy, PVD s/p L AKA who presented to [facility] ED 7/25 s/p VT with 1x cardioversion and subsequent conversion to narrow complex tachycardic rhythm. It is unclear if patient lost pulse and ROSC achieved, or if patient retained pulse and was cardioverted x1.  On 7/25 the patient had reportedly "not been feeling well this morning," and EMS was dispatched to patient's home for AMS, altered level of consciousness. Patient has reportedly been increasingly SOB x several days and has been using BDs with increasing frequency. Patient reportedly also with severe lower extremity swelling, extending to groin. Patient reportedly unresponsive on Ems arrival, noted to be in [place]. Unclear if pulse vs pulseless VT. Unclear if cardioversion vs Defib x1 at 120J, with subsequent narrow complex rhythm. Patient required transient BVM ventilation but was reportedly breathing spontaneously. No CPR was required, patient received no other pre-hospital medications.  Upon arrival to [facility] ED, patient was intubated for unresponsiveness. A CVC was placed. Patient given amiodarone and is being transferred to [facility].   SARS-CoV2 Negative 03/08/2019  Past Medical History  COPD DM2 CAD, MI, s/p CABG  Systolic heart failure ICM HTN HLD EtOH abuse FTT PVD  Ureter Cancer s/p L nephrectomy  CKD III   Significant Hospital Events    7/25 s/p VT arrest with ROSC after 1x cardioversion 120J. Intubated at [facility], CVC placed. Transferring to [facility] for ICU admission   Consults:  Vascular surgery  Cardiology   Procedures:  7/25 ETT>> 7/25 CVC >>   Significant Diagnostic Tests:  CXR 7/25> ETT and CVC in adequate position. No pneumothorax. Cardiomegaly. Bilateral pulmonary infiltrates vs edema   CT Head 7/25 > NAICA CT Abdomen/Pelvis 7/25 - Bilateral pleural effusions, bibasilar airspace opacities, cardiomegaly, anasarca, absent left kidney  Micro Data:  7/25 SARS CoV2 > negative  7/26 BCX > 7/26 RCX > Normal flora  Antimicrobials:  7/25 Rocephin>>   Interim history/subjective:  No acute events overnight.  Febrile this AM.   Objective   Blood pressure 95/64, pulse 91, temperature (!) 100.6 F (38.1 C), resp. rate 18, height 6' (1.829 m), weight 97.2 kg, SpO2 95 %.    Vent Mode: PRVC FiO2 (%):  [40 %-70 %] 50 % Set Rate:  [18 bmp] 18 bmp Vt Set:  [510 mL] 510 mL PEEP:  [5 cmH20] 5 cmH20 Pressure Support:  [10 cmH20] 10 cmH20 Plateau Pressure:  [13 cmH20-16 cmH20] 15 cmH20   Intake/Output Summary (Last 24 hours) at 04/03/2019 0800 Last data filed at 04/03/2019 0700 Gross per 24 hour  Intake 2490.9 ml  Output 305 ml  Net 2185.9 ml   Filed Weights   04/02/19 0407 04/03/19 0030 04/03/19 0441  Weight: 95.1 kg 97.2 kg 97.2 kg   Physical Exam: General: Chronically ill-appearing, sedated HENT: [place], AT, ETT in place, pin point pupils  Eyes:  EOMI, no scleral icterus Respiratory: Anterior rhonchi bilaterally Cardiovascular: RRR, -M/R/G, no JVD GI: BS+, soft, nontender Extremities: RLE erythema, L AKA-tenderness Neuro: Sedated GU: Foley in place  Resolved Hospital Problem list     Assessment & Plan:   S/p VT arrest with cardioversion, presumed pulseless VT Echo with LVEF 25-30% with RAE and LAE Plan Telemetry Appreciate Cardiology consult Continue Heparin gtt  Acute on chronic  respiratory failure requiring intubation 2/2 pulmonary edema, possible aspiration during arrest COPD  Plan Full vent support.  Repeat CXR today Continue Rocephin for suspected aspiration pna F/u blood NGTD  PAD protocol for goal RASS 0 and -1: Wean fentanyl and propofol as tolerated VAP  Fever - Follow fever curve. No increase in leukocytosis - Continue Ceftriaxone for 7 days for aspiration PNA - Repeat CXR  - If continues to have fevers will need to reculture and check UA  Chronic systolic heart failure (last EF 45-50% in 2018) NICM/CAD/HTN LVEF down to 25-30% Plan Management per cardiology. Will need GDT  Diuresis as tolerated  AoCKD III Hx Ureter cancer s/p L nephrectomy L abdominal protuberance  Baseline Cr approx 1.3. risk for AKI in setting of cardiac arrest Plan Strict I/O Trend UOP/BMP  PVD  RLE acute ischemia  Plan Appreciate vascular surgery consultation. No intervention at this time Continue heparin drip  Midline Sacral Wound -Followed by wound care P WOCN consult for inpatient recs   DM2 Plan SSI  Best practice:  Diet: NPO  Pain/Anxiety/Delirium protocol (if indicated): Fent, Precedex VAP protocol (if indicated): yes  DVT prophylaxis: SCD  GI prophylaxis: Pepcid Glucose control: SSI Mobility: BR  Code Status: Full  Family Communication: Will call to update family today  Disposition: ICU  Labs   CBC: Recent Labs  Lab 03/11/2019 1800  03/31/19 0110 03/31/19 0224 03/31/19 0343 04/01/19 0400 04/02/19 0413 04/03/19 0358  WBC 8.0  --  6.1 6.6  --  7.8 5.9 6.3  NEUTROABS 5.6  --  4.7  --   --   --   --   --   HGB 13.0   < > 11.6* 13.1 12.9* 12.2* 11.2* 11.8*  HCT 40.5   < > 35.7* 40.0 38.0* 39.0 35.1* 36.6*  MCV 91.2  --  91.1 91.1  --  93.5 92.4 92.4  PLT 289  --  218 228  --  220 196 213   < > = values in this interval not displayed.    Basic Metabolic Panel: Recent Labs  Lab 03/17/2019 1838  03/31/19 0110 03/31/19 0224 03/31/19  0343 03/31/19 1720 04/01/19 0400 04/02/19 0413 04/02/19 1610 04/03/19 0358  NA 135   < > 136 136 135 137 136 137  --  137  K 4.6   < > 4.5 4.7 4.4 5.0 4.5 4.0  --  4.3  CL 105  --  102 102  --  104 103 104  --  107  CO2 20*  --  23 21*  --  23 23 20*  --  20*  GLUCOSE 169*  --  145* 128*  --  101* 90 95  --  150*  BUN 31*  --  30* 29*  --  33* 36* 44*  --  50*  CREATININE 1.55*  --  1.77* 1.80*  --  2.16* 2.42* 2.48*  --  2.47*  CALCIUM 8.3*  --  8.4* 8.7*  --  8.3* 8.0* 7.8*  --  7.9*  MG 2.3  --  2.1 2.1  --   --  2.0 1.8  --   --   PHOS  --   --  4.1 3.9  --   --   --   --  5.0* 5.0*   < > = values in this interval not displayed.   GFR: Estimated Creatinine Clearance: 30.7 mL/min (A) (by C-G formula based on SCr of 2.47 mg/dL (H)). Recent Labs  Lab 03/31/19 0224 04/01/19 0400 04/02/19 0413 04/03/19 0358  WBC 6.6 7.8 5.9 6.3    Liver Function Tests: Recent Labs  Lab 03/20/2019 1838 03/31/19 0110  AST 24 19  ALT 19 17  ALKPHOS 57 59  BILITOT 0.8 0.6  PROT 5.7* 5.4*  ALBUMIN 2.9* 2.8*   No results for input(s): LIPASE, AMYLASE in the last 168 hours. No results for input(s): AMMONIA in the last 168 hours.  ABG    Component Value Date/Time   PHART 7.443 03/31/2019 0343   PCO2ART 32.7 03/31/2019 0343   PO2ART 71.0 (L) 03/31/2019 0343   HCO3 22.6 03/31/2019 0343   TCO2 24 03/31/2019 0343   ACIDBASEDEF 1.0 03/31/2019 0343   O2SAT 96.0 03/31/2019 0343    Coagulation Profile: Recent Labs  Lab 03/31/19 0110 04/01/19 0400 04/02/19 0413 04/03/19 0358  INR 1.4* 1.4* 1.3* 1.3*   Cardiac Enzymes: No results for input(s): CKTOTAL, CKMB, CKMBINDEX, TROPONINI in the last 168 hours.  HbA1C: Hgb A1c MFr Bld  Date/Time Value Ref Range Status  05/24/2017 07:04 PM 6.8 (H) 4.8 - 5.6 % Final    Comment:    (NOTE) Pre diabetes:          5.7%-6.4% Diabetes:              >6.4% Glycemic control for   <7.0% adults with diabetes   06/29/2016 08:50 AM 6.2 (H) 4.8 - 5.6  % Final    Comment:    (NOTE)         Pre-diabetes: 5.7 - 6.4         Diabetes: >6.4         Glycemic control for adults with diabetes: <7.0    CBG: Recent Labs  Lab 04/02/19 1521 04/02/19 1958 04/02/19 2348 04/03/19 0347 04/03/19 0714  GLUCAP 91 94 115* 139* 123*   Ina Homes, MD IMTS PGY3  Pager: [phone]

## 2019-04-03 NOTE — Progress Notes (Addendum)
Worsening hypoxia. Initially with SaO2 of 70-80% being bagged. ETT found to be at 23 from 26 the day prior. ETT push back in to 26. Equal breath sounds bilaterally. Placed back on vent. Maintaining SaO2 >90%. Target range 88-92% for him.   CXR reviewed with worsening RLL infiltrate. Will add metronidazole to ceftriaxone for better anaerobic coverage.

## 2019-04-03 NOTE — Progress Notes (Signed)
eLink Physician-Brief Progress Note Patient Name: AYODELE SANGALANG DOB: 06-Nov-1940 MRN: [id number]   Date of Service  04/03/2019  HPI/Events of Note  Hypoxemia - CXR with bilateral airspace disease.  Ppeak = 26.  eICU Interventions  Will order: 1. Will increase PEEP to 10.     Intervention Category Major Interventions: Hypoxemia - evaluation and management  Shenekia Riess [place] 04/03/2019, 9:21 PM

## 2019-04-03 NOTE — Progress Notes (Signed)
Progress Note  Patient Name: [name] Date of Encounter: 04/03/2019  Primary Cardiologist: new, saw Dr. Ellyn Hack in consult, but from [facility] area  Subjective   Intubated, continues to have writhing movement almost like tardive dyskinesia  Inpatient Medications    Scheduled Meds: . aspirin  81 mg Per Tube Daily  . carvedilol  3.125 mg Oral BID WC  . chlorhexidine gluconate (MEDLINE KIT)  15 mL Mouth Rinse BID  . Chlorhexidine Gluconate Cloth  6 each Topical Daily  . famotidine  20 mg Per Tube QHS  . feeding supplement (PRO-STAT SUGAR FREE 64)  30 mL Per Tube BID  . insulin aspart  0-15 Units Subcutaneous Q4H  . mouth rinse  15 mL Mouth Rinse 10 times per day  . sodium chloride flush  10-40 mL Intracatheter Q12H   Continuous Infusions: . sodium chloride Stopped (04/03/19 0540)  . cefTRIAXone (ROCEPHIN)  IV    . dexmedetomidine Stopped (03/31/19 1147)  . feeding supplement (VITAL AF 1.2 CAL) 60 mL/hr at 04/02/19 2000  . fentaNYL infusion INTRAVENOUS 45 mcg/hr (04/03/19 0800)  . heparin 1,550 Units/hr (04/03/19 0800)  . metronidazole    . norepinephrine (LEVOPHED) Adult infusion 2 mcg/min (04/03/19 0257)  . propofol (DIPRIVAN) infusion 5 mcg/kg/min (04/03/19 0531)   PRN Meds: acetaminophen (TYLENOL) oral liquid 160 mg/5 mL, fentaNYL (SUBLIMAZE) injection, fentaNYL (SUBLIMAZE) injection, sodium chloride flush   Vital Signs    Vitals:   04/03/19 0745 04/03/19 0800 04/03/19 0815 04/03/19 0830  BP: (!) 80/58 (!) 59/49 (!) 56/29 137/80  Pulse: 90 89 94 94  Resp: _0 Temp: (!) 100.6 F (38.1 C) (!) 100.6 F (38.1 C) (!) 100.6 F (38.1 C) (!) 100.6 F (38.1 C)  TempSrc:  Bladder    SpO2: 96% 95% 96% 97%  Weight:      Height:        Intake/Output Summary (Last 24 hours) at 04/03/2019 0849 Last data filed at 04/03/2019 0800 Gross per 24 hour  Intake 2571.38 ml  Output 310 ml  Net 2261.38 ml    I/O since admission: +1150  Filed Weights   04/02/19  0407 04/03/19 0030 04/03/19 0441  Weight: 95.1 kg 97.2 kg 97.2 kg    Telemetry    Sinus rhythm in the 90 - Personally Reviewed  Had some PAF since amiodarone was dc'd and today more frequent PVCs.  ECG    ECG (independently read by me): NSR with sinus arrythmia 89; first degree AV block  ECG (independently read by me): NSR at 74; Ist degree AV block; PR 246 msec; NST changes  Physical Exam   BP 137/80   Pulse 94   Temp (!) 100.6 F (38.1 C)   Resp 17   Ht 6' (1.829 m)   Wt 97.2 kg   SpO2 97%   BMI 29.06 kg/m  General: Alert, oriented, no distress.  Skin: normal turgor, no rashes, warm and dry HEENT: Normocephalic, atraumatic. Pupils equal round and reactive to light; sclera anicteric; extraocular muscles intact;  Nose without nasal septal hypertrophy Mouth/Parynx; intubated Neck: No JVD, no carotid bruits; normal carotid upstroke Lungs: clear to ausculatation and percussion; no wheezing or rales Chest wall: without tenderness to palpitation Heart: PMI not displaced, RR with occasional PVC, rate ~ 100, s1 s2 normal, 1/6 systolic murmur, no diastolic murmur, no rubs, gallops, thrills, or heaves Abdomen: soft, nontender; no hepatosplenomehaly, BS+; abdominal aorta nontender and not dilated by palpation. Back: no CVA tenderness Pulses  2+ Musculoskeletal: full range of motion, normal strength, no joint deformities Extremities: L AKA; cold ischemic RLE Neurologic: grossly nonfocal; Cranial nerves grossly wnl   Labs    Chemistry Recent Labs  Lab 03/28/2019 1838  03/31/19 0110  04/01/19 0400 04/02/19 0413 04/03/19 0358  NA 135   < > 136   < > 136 137 137  K 4.6   < > 4.5   < > 4.5 4.0 4.3  CL 105  --  102   < > 103 104 107  CO2 20*  --  23   < > 23 20* 20*  GLUCOSE 169*  --  145*   < > 90 95 150*  BUN 31*  --  30*   < > 36* 44* 50*  CREATININE 1.55*  --  1.77*   < > 2.42* 2.48* 2.47*  CALCIUM 8.3*  --  8.4*   < > 8.0* 7.8* 7.9*  PROT 5.7*  --  5.4*  --   --   --    --   ALBUMIN 2.9*  --  2.8*  --   --   --   --   AST 24  --  19  --   --   --   --   ALT 19  --  17  --   --   --   --   ALKPHOS 57  --  59  --   --   --   --   BILITOT 0.8  --  0.6  --   --   --   --   GFRNONAA 43*  --  36*   < > 25* 24* 24*  GFRAA 50*  --  42*   < > 29* 28* 28*  ANIONGAP 10  --  11   < > _0 < > = values in this interval not displayed.     Hematology Recent Labs  Lab 04/01/19 0400 04/02/19 0413 04/03/19 0358  WBC 7.8 5.9 6.3  RBC 4.17* 3.80* 3.96*  HGB 12.2* 11.2* 11.8*  HCT 39.0 35.1* 36.6*  MCV 93.5 92.4 92.4  MCH 29.3 29.5 29.8  MCHC 31.3 31.9 32.2  RDW 17.0* 16.8* 17.0*  PLT 220 196 213    Cardiac EnzymesNo results for input(s): TROPONINI in the last 168 hours. No results for input(s): TROPIPOC in the last 168 hours.   BNP Recent Labs  Lab 03/29/2019 1800 03/31/19 0224  BNP 483.0* 634.4*     DDimer No results for input(s): DDIMER in the last 168 hours.   Lipid Panel     Component Value Date/Time   CHOL  09/02/2010 0531    175        ATP III CLASSIFICATION:  <200     mg/dL   Desirable  200-239  mg/dL   Borderline High  >=240    mg/dL   High          TRIG 75 04/02/2019 0413   HDL 71 09/02/2010 0531   CHOLHDL 2.5 09/02/2010 0531   VLDL 38 09/02/2010 0531   LDLCALC  09/02/2010 0531    66        Total Cholesterol/HDL:CHD Risk Coronary Heart Disease Risk Table                     Men   Women  1/2 Average Risk   3.4   3.3  Average Risk  5.0   4.4  2 X Average Risk   9.6   7.1  3 X Average Risk  23.4   11.0        Use the calculated Patient Ratio above and the CHD Risk Table to determine the patient's CHD Risk.        ATP III CLASSIFICATION (LDL):  <100     mg/dL   Optimal  100-129  mg/dL   Near or Above                    Optimal  130-159  mg/dL   Borderline  160-189  mg/dL   High  >190     mg/dL   Very High       Radiology    No results found.  Cardiac Studies   ECHO IMPRESSIONS: 03/31/2019  1. The left  ventricle has severely reduced systolic function, with an ejection fraction of 25-30%. The cavity size was normal. Indeterminate diastolic filling due to E-A fusion.  2. LVEF is severely depressed; proximal portions of LV move the best but are not normal.  3. The right ventricle has normal systolic function. The cavity was mildly enlarged. There is no increase in right ventricular wall thickness.  4. Left atrial size was mildly dilated.  5. Right atrial size was moderately dilated.  6. The mitral valve is grossly normal. There is mild mitral annular calcification present. Mitral valve regurgitation is mild to moderate by color flow Doppler.  7. Tricuspid valve regurgitation is moderate.  8. The inferior vena cava was dilated in size with <50% respiratory variability.  9. The left ventricular function has worsened.  Patient Profile     [name] is a 78 y.o. male with a hx of CAD-prior MI with CABG (per family CABG x5 was in 2004 -[place] -by report, has had at least one catheterization prior to his AKA with no stents.) resulting in ischemic cardiomyopathy with chronic combined systolic and diastolic heart failure), COPD, PAD (right SFA stent, left AKA), hypertension who suffered a cardiac arrest 03/16/2019.  Assessment & Plan    1. Day 4 s/p VT cardiac arrest with 1 shock and restoration of ROSC. Will need future ICD for secondary prevention for subsequent cardiac arrest.  2. Combined cardiomyopathy:  EF 25- 30%: currently  EF prior to arrest not yet known.  Levophed off.   BNP 634 on 7/26.  Levophed restarted last night.  Pt on propofol. BP now improved to 130, will try to wean  3. Cardiogenic shock:  Was on levophed; weaned and dc'd and resumed last night.  BP now 130/82. Will continue low dose carvedilol. Not candidate for ACE-I/ARB/ spiro with Cr, but if further stabiles can add hydralazine.  4.  CAD/ s/p CABG in [place], [place]. Will need to  obtain data.  Does not appear to be an acute MI/STEMI.  HS Troponin is low at 89.  Depending on neurologic function and future PVD surgery, will need future ischemic evaluation, but with only one kidney and Cr increasing would not give contrast presently.  5. PAF/ occasional to frequent PVCs: Amiodarone was dc'd by EP. PVC today; With EF 25% recommend re-initiation of amiodarone and will start 400 mg bid for 5 days then probably reduce to 200 mg bid. Mg 1.8; will give 1 gram Mg today.  5. PVD: prior R AKA, now with ischemic cold R foot with non-retrievable capillary bed, felt to be non-salvageable; will need  R AKA. Risk is high but agree may need to be done due to ongoing risk of ischemic leg  6. AKI: Previous left nephrectomy with baseline Stage 3 CKD.  Cr increased today to 2.47 (1.55 on 7/25)  7. Respiratory Failure: intubated followed by CCM; ETT tube was repositioned.   8. Aspiration pneumonia: Flagyl added today to Rocephin for improved anaerobic coverage  9. F/U lipid panel once off propothol  CC: 35 minutes   Signed, Troy Sine, MD, [facility] 04/03/2019, 8:49 AM

## 2019-04-03 NOTE — Progress Notes (Addendum)
PCCM Interval Progress Note  Called to bedside urgently due to hypotension with SBP in 40's and 50's despite 48mcg/kg/min levophed.  Pt being bagged by RT due to desaturations in 60's despite 100% FiO2 and 10 PEEP.  Radial arterial line attempted by RT but no radial pulses appreciated and unsuccessful.  I therefore placed right femoral arterial line emergently.  While placing art line, Levophed increased to 65mcg/kg/min, vasopression started, and 3 amps HCO3 given.  SBP improved to 90's shortly after HCO3.  HCO3 infusion ordered at 75cc/hr.  BP on art line now reading 154/81.  Vasopressin stopped and RN asked to wean levophed back down.  ABG 7.157 / 58 / 155 / 22.  Pt placed back on full vent support (PRVC 100 / 5 / 20 510).  RT asked to repeat ABG at 2300 and wean PEEP / FiO2 back down as able.  CXR, CBC, CMP, lactate, troponin all ordered.  ELINK to follow. AXR also ordered given "bulge" on left lower abdomen.  RN does not feel that this was quite as noticeable earlier in the night.   2239: CXR with slight vascular congestion.  CVP 20 and net +5L.  Will give 1 time dose 40mg  lasix. AXR with no free air noted.  Formal read pending.   Montey Hora, [facility] Pager: [phone].  If no answer, [phone] 04/03/2019, 10:31 PM

## 2019-04-03 NOTE — Progress Notes (Signed)
   VASCULAR SURGERY ASSESSMENT & PLAN:   ISCHEMIC RIGHT LOWER EXTREMITY: His right foot remains stable.  He does have a monophasic distal anterior tibial signal with the Doppler. .  I think we can proceed with right above-the-knee amputation whenever it is felt that he has been optimized from a medical standpoint.  Clearly he will be at increased risk for any surgery..  We will continue to follow.  SUBJECTIVE:   Sedated on vent  PHYSICAL EXAM:   Vitals:   04/03/19 0315 04/03/19 0330 04/03/19 0345 04/03/19 0356  BP: (!) 87/60 (!) 84/60 92/63   Pulse: 72 72 75   Resp: '18 18 17   '$ Temp: 99.9 F (37.7 C) 99.9 F (37.7 C) 99.9 F (37.7 C) 99.9 F (37.7 C)  TempSrc:    Core  SpO2: 97% 97% 97%   Weight:      Height:       No change in the right foot. He continues to have a barely audible distal right anterior tibial signal.  LABS:   Lab Results  Component Value Date   WBC 5.9 04/02/2019   HGB 11.2 (L) 04/02/2019   HCT 35.1 (L) 04/02/2019   MCV 92.4 04/02/2019   PLT 196 04/02/2019   Lab Results  Component Value Date   CREATININE 2.48 (H) 04/02/2019   Lab Results  Component Value Date   INR 1.3 (H) 04/02/2019   CBG (last 3)  Recent Labs    04/02/19 1958 04/02/19 2348 04/03/19 0347  GLUCAP 94 115* 139*    PROBLEM LIST:    Principal Problem:   Cardiac arrest ([place]) Active Problems:   ACC/AHA stage B congestive heart failure due to ischemic cardiomyopathy (HCC)   Peripheral vascular disease (HCC)   Acute respiratory failure (HCC)   Coronary artery disease involving native coronary artery of native heart with angina pectoris ([place])   Diabetes mellitus with peripheral vascular disease ([place])   Tobacco abuse   Ventricular tachycardia (HCC)   Acute on chronic combined systolic and diastolic CHF (congestive heart failure) ([place])   Demand ischemia (HCC)   Critical lower limb ischemia   CURRENT MEDS:   . aspirin  81 mg Per Tube Daily  . carvedilol  3.125 mg Oral  BID WC  . chlorhexidine gluconate (MEDLINE KIT)  15 mL Mouth Rinse BID  . Chlorhexidine Gluconate Cloth  6 each Topical Daily  . famotidine  20 mg Per Tube QHS  . feeding supplement (PRO-STAT SUGAR FREE 64)  30 mL Per Tube BID  . insulin aspart  0-15 Units Subcutaneous Q4H  . mouth rinse  15 mL Mouth Rinse 10 times per day  . sodium chloride flush  10-40 mL Intracatheter Q12H    [name] Beeper: [phone] Office: [phone] 04/03/2019

## 2019-04-04 ENCOUNTER — Inpatient Hospital Stay (HOSPITAL_COMMUNITY): Payer: Medicare HMO

## 2019-04-04 DIAGNOSIS — R0902 Hypoxemia: Secondary | ICD-10-CM

## 2019-04-04 LAB — COMPREHENSIVE METABOLIC PANEL
ALT: 448 U/L — ABNORMAL HIGH (ref 0–44)
AST: 948 U/L — ABNORMAL HIGH (ref 15–41)
Albumin: 2.3 g/dL — ABNORMAL LOW (ref 3.5–5.0)
Alkaline Phosphatase: 214 U/L — ABNORMAL HIGH (ref 38–126)
Anion gap: 15 (ref 5–15)
BUN: 63 mg/dL — ABNORMAL HIGH (ref 8–23)
CO2: 20 mmol/L — ABNORMAL LOW (ref 22–32)
Calcium: 7.9 mg/dL — ABNORMAL LOW (ref 8.9–10.3)
Chloride: 104 mmol/L (ref 98–111)
Creatinine, Ser: 2.7 mg/dL — ABNORMAL HIGH (ref 0.61–1.24)
GFR calc Af Amer: 25 mL/min — ABNORMAL LOW (ref >60)
GFR calc non Af Amer: 22 mL/min — ABNORMAL LOW (ref >60)
Glucose, Bld: 142 mg/dL — ABNORMAL HIGH (ref 70–99)
Potassium: 4.2 mmol/L (ref 3.5–5.1)
Sodium: 139 mmol/L (ref 135–145)
Total Bilirubin: 0.3 mg/dL (ref 0.3–1.2)
Total Protein: 5.5 g/dL — ABNORMAL LOW (ref 6.5–8.1)

## 2019-04-04 LAB — CBC
HCT: 40.8 % (ref 39.0–52.0)
Hemoglobin: 13.2 g/dL (ref 13.0–17.0)
MCH: 29.3 pg (ref 26.0–34.0)
MCHC: 32.4 g/dL (ref 30.0–36.0)
MCV: 90.5 fL (ref 80.0–100.0)
Platelets: 193 10*3/uL (ref 150–400)
RBC: 4.51 MIL/uL (ref 4.22–5.81)
RDW: 16.8 % — ABNORMAL HIGH (ref 11.5–15.5)
WBC: 9.4 10*3/uL (ref 4.0–10.5)
nRBC: 0 % (ref 0.0–0.2)

## 2019-04-04 LAB — POCT I-STAT 7, (LYTES, BLD GAS, ICA,H+H)
Acid-base deficit: 4 mmol/L — ABNORMAL HIGH (ref 0.0–2.0)
Bicarbonate: 22.3 mmol/L (ref 20.0–28.0)
Calcium, Ion: 1.11 mmol/L — ABNORMAL LOW (ref 1.15–1.40)
HCT: 40 % (ref 39.0–52.0)
Hemoglobin: 13.6 g/dL (ref 13.0–17.0)
O2 Saturation: 78 %
Potassium: 3.7 mmol/L (ref 3.5–5.1)
Sodium: 141 mmol/L (ref 135–145)
TCO2: 24 mmol/L (ref 22–32)
pCO2 arterial: 42.5 mmHg (ref 32.0–48.0)
pH, Arterial: 7.327 — ABNORMAL LOW (ref 7.350–7.450)
pO2, Arterial: 46 mmHg — ABNORMAL LOW (ref 83.0–108.0)

## 2019-04-04 LAB — HEPATIC FUNCTION PANEL
ALT: 439 U/L — ABNORMAL HIGH (ref 0–44)
AST: 825 U/L — ABNORMAL HIGH (ref 15–41)
Albumin: 2.3 g/dL — ABNORMAL LOW (ref 3.5–5.0)
Alkaline Phosphatase: 195 U/L — ABNORMAL HIGH (ref 38–126)
Bilirubin, Direct: 0.2 mg/dL (ref 0.0–0.2)
Indirect Bilirubin: 0.4 mg/dL (ref 0.3–0.9)
Total Bilirubin: 0.6 mg/dL (ref 0.3–1.2)
Total Protein: 5.3 g/dL — ABNORMAL LOW (ref 6.5–8.1)

## 2019-04-04 LAB — LACTIC ACID, PLASMA
Lactic Acid, Venous: 2.2 mmol/L (ref 0.5–1.9)
Lactic Acid, Venous: 1.6 mmol/L (ref 0.5–1.9)

## 2019-04-04 LAB — GLUCOSE, CAPILLARY
Glucose-Capillary: 136 mg/dL — ABNORMAL HIGH (ref 70–99)
Glucose-Capillary: 152 mg/dL — ABNORMAL HIGH (ref 70–99)
Glucose-Capillary: 155 mg/dL — ABNORMAL HIGH (ref 70–99)
Glucose-Capillary: 174 mg/dL — ABNORMAL HIGH (ref 70–99)
Glucose-Capillary: 188 mg/dL — ABNORMAL HIGH (ref 70–99)
Glucose-Capillary: 190 mg/dL — ABNORMAL HIGH (ref 70–99)
Glucose-Capillary: 197 mg/dL — ABNORMAL HIGH (ref 70–99)

## 2019-04-04 LAB — BASIC METABOLIC PANEL
Anion gap: 13 (ref 5–15)
BUN: 64 mg/dL — ABNORMAL HIGH (ref 8–23)
CO2: 20 mmol/L — ABNORMAL LOW (ref 22–32)
Calcium: 8.1 mg/dL — ABNORMAL LOW (ref 8.9–10.3)
Chloride: 106 mmol/L (ref 98–111)
Creatinine, Ser: 2.76 mg/dL — ABNORMAL HIGH (ref 0.61–1.24)
GFR calc Af Amer: 25 mL/min — ABNORMAL LOW (ref >60)
GFR calc non Af Amer: 21 mL/min — ABNORMAL LOW (ref >60)
Glucose, Bld: 177 mg/dL — ABNORMAL HIGH (ref 70–99)
Potassium: 3.8 mmol/L (ref 3.5–5.1)
Sodium: 139 mmol/L (ref 135–145)

## 2019-04-04 LAB — COOXEMETRY PANEL
Carboxyhemoglobin: 0.6 % (ref 0.5–1.5)
Methemoglobin: 0.9 % (ref 0.0–1.5)
O2 Saturation: 77.8 %
Total hemoglobin: 13.3 g/dL (ref 12.0–16.0)

## 2019-04-04 LAB — HEPARIN LEVEL (UNFRACTIONATED)
Heparin Unfractionated: 0.23 IU/mL — ABNORMAL LOW (ref 0.30–0.70)
Heparin Unfractionated: 0.23 IU/mL — ABNORMAL LOW (ref 0.30–0.70)
Heparin Unfractionated: 0.27 IU/mL — ABNORMAL LOW (ref 0.30–0.70)

## 2019-04-04 LAB — PROTIME-INR
INR: 1.5 — ABNORMAL HIGH (ref 0.8–1.2)
Prothrombin Time: 17.4 seconds — ABNORMAL HIGH (ref 11.4–15.2)

## 2019-04-04 LAB — TROPONIN I (HIGH SENSITIVITY): Troponin I (High Sensitivity): 128 ng/L (ref <18)

## 2019-04-04 LAB — SURGICAL PCR SCREEN
MRSA, PCR: NEGATIVE
Staphylococcus aureus: NEGATIVE

## 2019-04-04 LAB — MAGNESIUM: Magnesium: 2.1 mg/dL (ref 1.7–2.4)

## 2019-04-04 MED ORDER — AMIODARONE HCL 200 MG PO TABS
400.0000 mg | ORAL_TABLET | Freq: Two times a day (BID) | ORAL | Status: DC
  Administered 2019-04-04 – 2019-04-06 (×5): 400 mg
  Filled 2019-04-04 (×5): qty 2

## 2019-04-04 MED ORDER — CEFAZOLIN SODIUM-DEXTROSE 2-4 GM/100ML-% IV SOLN: 2.0000 g | INTRAVENOUS | Status: AC

## 2019-04-04 MED ORDER — IPRATROPIUM-ALBUTEROL 0.5-2.5 (3) MG/3ML IN SOLN
3.0000 mL | RESPIRATORY_TRACT | Status: DC
  Administered 2019-04-04 – 2019-04-11 (×43): 3 mL via RESPIRATORY_TRACT
  Filled 2019-04-04 (×44): qty 3

## 2019-04-04 MED ORDER — SODIUM CHLORIDE 3 % IN NEBU
4.0000 mL | INHALATION_SOLUTION | Freq: Two times a day (BID) | RESPIRATORY_TRACT | Status: AC
  Administered 2019-04-04 – 2019-04-06 (×5): 4 mL via RESPIRATORY_TRACT
  Filled 2019-04-04 (×6): qty 4

## 2019-04-04 NOTE — Progress Notes (Signed)
ANTICOAGULATION CONSULT NOTE - [place] for heparin Indication: chest pain/ACS  Allergies  Allergen Reactions  . Erythromycin Anaphylaxis and Rash  . Iohexol Anaphylaxis, Hives, Itching and Other (See Comments)    Note:  13 HR PRE-MED GIVEN AND SUCCESSFUL-ARS 07/15/07.     Marland Kitchen Penicillins Anaphylaxis, Itching and Other (See Comments)    Has patient had a PCN reaction causing immediate rash, facial/tongue/throat swelling, SOB or lightheadedness with hypotension: Yes Has patient had a PCN reaction causing severe rash involving mucus membranes or skin necrosis: No Has patient had a PCN reaction that required hospitalization No Has patient had a PCN reaction occurring within the last 10 years: No If all of the above answers are "NO", then may proceed with Cephalosporin use.    Patient Measurements: Height: 6' (182.9 cm) Weight: 233 lb 0.4 oz (105.7 kg) IBW/kg (Calculated) : 77.6 Heparin Dosing Weight: 97.3 kg  Vital Signs: Temp: 100.2 F (37.9 C) (07/30 2200) Temp Source: Bladder (07/30 2200) BP: 106/64 (07/30 2200) Pulse Rate: 81 (07/30 2200)  Labs: Recent Labs    04/02/19 0413 04/03/19 0358  04/03/19 2306 04/03/19 2316 04/04/19 0209 04/04/19 1034 04/04/19 1126 04/04/19 2211  HGB 11.2* 11.8*   < > 13.5 14.3 13.2 13.6  --   --   HCT 35.1* 36.6*   < > 42.3 42.0 40.8 40.0  --   --   PLT 196 213  --  201  --  193  --   --   --   LABPROT 16.1* 15.8*  --   --   --  17.4*  --   --   --   INR 1.3* 1.3*  --   --   --  1.5*  --   --   --   HEPARINUNFRC 0.37 0.33  --   --   --  0.23*  --  0.23* 0.27*  CREATININE 2.48* 2.47*  --  2.70*  --  2.76*  --   --   --   TROPONINIHS  --   --   --  128*  --   --   --   --   --    < > = values in this interval not displayed.    Estimated Creatinine Clearance: 28.6 mL/min (A) (by C-G formula based on SCr of 2.76 mg/dL (H)).   Medical History: Past Medical History:  Diagnosis Date  . Arthritis    "qwhere"  (07/09/2013)  . Asthma   . COPD (chronic obstructive pulmonary disease) ([place])    pt denies this hx on 07/09/2013; has chronic dyspnea  . Coronary artery disease    Reportedly multivessel disease-CABG  . Gout   . H/O hiatal hernia 1997  . H/O: rheumatic fever    "had rheumatic fever as a kid" (07/09/2013) -has murmur  . Hyperlipidemia with target LDL less than 70   . Hypertension   . Ischemic cardiomyopathy    By report,-ICD  . Myocardial infarction ([place]) 03/2002; ~ 2011  . Neuropathy   . PAD (peripheral artery disease) ([place]) 2014   Right SFA stent November 2014; status post left AKA  . Pneumonia    "once" (07/09/2013)  . Renal disorder    "only have 1 kidney; due to go back soon to check the other one" (07/09/2013)  . RUPTURE ROTATOR CUFF 12/03/2009   Qualifier: Diagnosis of  By: Aline Brochure MD, Dorothyann Peng    . Type II diabetes mellitus ([place])   . Ureter cancer (  [place]) 12/2001   "shut down my kidney & resulted in nephrectomy" (07/09/2013)  . UTI (urinary tract infection) 10/04/2018    Assessment: 78 yo male with VT and concern for ACS (hx MI and CABG) also with ischemic right foot that will most likely  require R AKA. Pharmacy consulted for heparin for ACS.  -heparin level is 0.27 after an increase to 1900 units/hr  Goal of Therapy:  Heparin level 0.3-0.7 units/ml Monitor platelets by anticoagulation protocol: Yes   Plan:  -Increase heparin to 2050 units/hr -Heparin level in 8 hours and daily wth CBC daily  Hildred Laser, PharmD Clinical Pharmacist **Pharmacist phone directory can now be found on [email or web address] ([email or web address]).  Listed under [place].

## 2019-04-04 NOTE — Plan of Care (Signed)
  Problem: Education: Goal: Ability to manage disease process will improve Outcome: Not Progressing Goal: Individualized Educational Video(s) Outcome: Not Progressing   Problem: Cardiac: Goal: Ability to achieve and maintain adequate cardiopulmonary perfusion will improve Outcome: Not Progressing   Problem: Education: Goal: Knowledge of General Education information will improve Description: Including pain rating scale, medication(s)/side effects and non-pharmacologic comfort measures Outcome: Not Progressing   Problem: Health Behavior/Discharge Planning: Goal: Ability to manage health-related needs will improve Outcome: Not Progressing   Problem: Clinical Measurements: Goal: Ability to maintain clinical measurements within normal limits will improve Outcome: Not Progressing Goal: Will remain free from infection Outcome: Not Progressing Goal: Diagnostic test results will improve Outcome: Not Progressing Goal: Respiratory complications will improve Outcome: Not Progressing Goal: Cardiovascular complication will be avoided Outcome: Not Progressing   Problem: Activity: Goal: Risk for activity intolerance will decrease Outcome: Not Progressing   Problem: Nutrition: Goal: Adequate nutrition will be maintained Outcome: Not Progressing   Problem: Coping: Goal: Level of anxiety will decrease Outcome: Not Progressing   Problem: Elimination: Goal: Will not experience complications related to bowel motility Outcome: Not Progressing Goal: Will not experience complications related to urinary retention Outcome: Not Progressing   Problem: Pain Managment: Goal: General experience of comfort will improve Outcome: Not Progressing   Problem: Safety: Goal: Ability to remain free from injury will improve Outcome: Not Progressing   Problem: Skin Integrity: Goal: Risk for impaired skin integrity will decrease Outcome: Not Progressing   Problem: Activity: Goal: Ability to  tolerate increased activity will improve Outcome: Not Progressing   Problem: Respiratory: Goal: Ability to maintain a clear airway and adequate ventilation will improve Outcome: Not Progressing   Problem: Role Relationship: Goal: Method of communication will improve Outcome: Not Progressing

## 2019-04-04 NOTE — Progress Notes (Addendum)
NAME:  DARWIN ROTHLISBERGER, MRN:  [id number], DOB:  03-28-1941, LOS: 5 ADMISSION DATE:  04/02/2019, CONSULTATION DATE:  03/22/2019 REFERRING MD:  Forestine Na EM (Dr. Sabra Heck), CHIEF COMPLAINT:  S/p VT and 1x cardioversion  Brief History   78 yo M s/p VT arrest s/p cardioversion x 1 by EMS, unclear if patient was pulseless or had pulse. Intubated at [facility] ED and transferred to [facility]  SARS-CoV2 Negative 03/12/2019  Past Medical History  COPD, DM2, CAD, MI, s/p CABG , Systolic heart failure ICM HTN, HLD, EtOH abuse, FTT, PVD , Ureter Cancer s/p L nephrectomy  CKD III   Significant Hospital Events   7/25 s/p VT arrest with ROSC after 1x cardioversion 120J. Intubated at [facility], CVC placed. Transferring to [facility] for ICU admission  7/26: Seen by vascular surgery for ischemic right limb felt that the extremity was not salvageable and recommended amputation. 7/28: Having episodes of desaturation during weaning efforts.  Vasoactive drips weaned off.  Being treated for possible infiltrate in right lower lobe. 7/29: Desaturated down to the 60s requiring bag valve mask ventilation, associated hypotension requiring escalation of vasoactive drips.  Arterial blood gas obtained showing pH of 7.16, PCO2 of 58, saturations 155 and bicarbonate 22, bicarbonate drip initiated 7/30: Vasopressin discontinued, now on just norepinephrine at decreasing dose.  Still having trouble weaning pressors.  Did get 1 dose of Lasix overnight.  Seen by vascular surgery planning for above-the-knee amputation Consults:  Vascular surgery  Cardiology   Procedures:  7/25 ETT>> 7/25 CVC >>   Significant Diagnostic Tests:  CXR 7/25> ETT and CVC in adequate position. No pneumothorax. Cardiomegaly. Bilateral pulmonary infiltrates vs edema   CT Head 7/25 > NAICA CT Abdomen/Pelvis 7/25 - Bilateral pleural effusions, bibasilar airspace opacities, cardiomegaly, anasarca, absent left kidney  Micro Data:  7/25 SARS CoV2 > negative   7/26 BCX > 7/26 RCX > Normal flora  Antimicrobials:  7/25 Rocephin>>   Interim history/subjective:  Desaturated last night.  Required escalation of pressors  Objective   Blood pressure 108/71, pulse 72, temperature 99.7 F (37.6 C), resp. rate (Abnormal) 22, height 6' (1.829 m), weight 105.7 kg, SpO2 94 %. CVP:  [9 mmHg-55 mmHg] 14 mmHg  Vent Mode: PRVC FiO2 (%):  [60 %-100 %] 100 % Set Rate:  [18 bmp-20 bmp] 20 bmp Vt Set:  [510 mL] 510 mL PEEP:  [5 cmH20-10 cmH20] 10 cmH20 Plateau Pressure:  [15 cmH20-22 cmH20] 21 cmH20   Intake/Output Summary (Last 24 hours) at 04/04/2019 1005 Last data filed at 04/04/2019 1000 Gross per 24 hour  Intake 3701.15 ml  Output 515 ml  Net 3186.15 ml   Filed Weights   04/03/19 0030 04/03/19 0441 04/04/19 0319  Weight: 97.2 kg 97.2 kg 105.7 kg   Physical Exam: General 78 year old white male currently sedated and on full ventilatory support HEENT normocephalic atraumatic no jugular venous distention orally intubated mucous membranes moist Pulmonary: Scattered rhonchi without accessory use equal chest rise on mechanically assisted breath Cardiac: Regular irregular without murmur rub or gallop Abdomen: Soft nontender positive bowel sounds Extremities: Left AKA.  Right lower extremity is cool mottled without pulses Neuro: Heavily sedated Resolved Hospital Problem list     Assessment & Plan:   S/p VT arrest with cardioversion, presumed pulseless VT, with combined acute on chronic systolic and diastolic heart failure in the setting of nonischemic cardiomyopathy, coronary artery disease, and hypertension. Now in  cardiogenic shock Echo with LVEF 25-30% with RAE and LAE -Currently  being followed by cardiology Plan Continue telemetry monitoring Eventually needs further ischemia work-up however this will be challenging given underlying poor renal function Holding ACE inhibitor Holding diuresis Holding beta-blockade Keep euvolemic at this point  Avoid acidosis, I suspect hypoxia and acidosis were contributing to cardiac dysfunction last evening Titrate norepinephrine for mean arterial pressure greater than 65  Acute on chronic respiratory failure requiring intubation 2/2 pulmonary edema, possible aspiration during arrest Respiratory acidosis COPD  Portable chest x-ray personally reviewed: This demonstrates cardiomegaly, endotracheal tube in satisfactory position,Slightly improved right basilar aeration but still has right lower lobe infiltrate -Had episode of hypoxia last night, unclear if he has mucous plugging or having episodes of flash pulmonary edema Plan Continuing full ventilator support Wean PEEP and FiO2 VAP bundle Add hypertonic saline nebulized twice daily Add bronchodilators Repeat a.m. chest x-ray day #6 ceftriaxone, I think we can discontinue Flagyl, as sputum cultures were negative we will stop all antibiotics at day 7 Repeat arterial blood gas now, I am not convinced we need to continue bicarbonate infusion  afib Plan Cont amio  IV heparin  Tele  Fever without significant leukocytosis Plan Continue to trend fever and white blood cell curve  AoCKD III Hx Ureter cancer s/p L nephrectomy L abdominal protuberance  Baseline Cr approx 1.3. risk for AKI in setting of cardiac arrest, renal function worse after episode of hypotension on 7/29 Plan Strict intake output Avoid hypotension Keep euvolemic at this point Renal dose medications A.m. chemistry  Shock liver Plan A.m. LFTs  Mild coagulopathy Plan A.m. INR  PVD  RLE acute ischemia  Plan Appreciate vascular consultation Continue heparin drip until on-call to the [facility] for above-the-knee amputation on 7/31  Midline Sacral Wound -Followed by wound care Plan As directed by wound care  DM2 Plan Sliding scale insulin  Best practice:  Diet: NPO  Pain/Anxiety/Delirium protocol (if indicated): Fent, Precedex VAP protocol (if indicated):  yes  DVT prophylaxis: SCD  GI prophylaxis: Pepcid Glucose control: SSI Mobility: BR  Code Status: Full  Family Communication: Will call to update family today  Disposition: Remains critically ill status post VT T arrest in the setting of underlying severe cardiomyopathy as well as severe peripheral vascular disease.  He had episode of desaturation last night as well as respiratory acidosis.  Not clear to me but sounds like possibly intermittently mucous plugging.  Placed on bicarbonate infusion due to overwhelming acidosis however on evaluation this was primarily respiratory in nature.  It certainly possible last night's events with a complication of mucous plugging hypoxia and acidosis it seems as though he is stabilizing now.  Goals for today will be to continue supportive measures including weaning vasoactive drips, will continue antibiotics to support for presumed aspiration pneumonia he has 1 more days of ceftriaxone.  We will repeat an arterial blood gas, I suspect we can discontinue bicarbonate infusion.  We need to continue to try to reach out to family, he is looking at a right AKA plan for tomorrow on 7/31.  This may also be contributing to his intermittent episodes of destabilization.  I also wonder if pulse oximetry is accurate for him given his severe underlying peripheral or vascular disease.  He remains critically ill My critical care time is 40 minutes  Erick Colace ACNP-BC [place] Pager # [phone] OR # [phone] if no answer

## 2019-04-04 NOTE — H&P (View-Only) (Signed)
   VASCULAR SURGERY ASSESSMENT & PLAN:   ISCHEMIC RIGHT LOWER EXTREMITY: The ischemic changes to his right lower extremity continue to progress.  He has mottling up to above the knee.  I plan on proceeding with right above-the-knee amputation tomorrow if he is medically stable.  I have tried to contact his wife at both the home number and her mobile number.  I have written preop orders.  I will have the nurse stop his heparin on-call to the OR tomorrow.  SUBJECTIVE:   Sedated on vent  PHYSICAL EXAM:   Vitals:   04/04/19 0600 04/04/19 0700 04/04/19 0754 04/04/19 0800  BP: 104/71 104/72 99/66 103/71  Pulse: 80 78 80 77  Resp: '20 19 19 '$ (!) 21  Temp: 99.9 F (37.7 C) 99.9 F (37.7 C) 99.7 F (37.6 C) 99.7 F (37.6 C)  TempSrc:    Bladder  SpO2: 98% 98% 98% 100%  Weight:      Height:       Worsening ischemic changes to the right lower extremity with mottling up to above the knee.  LABS:   Lab Results  Component Value Date   WBC 9.4 04/04/2019   HGB 13.2 04/04/2019   HCT 40.8 04/04/2019   MCV 90.5 04/04/2019   PLT 193 04/04/2019   Lab Results  Component Value Date   CREATININE 2.76 (H) 04/04/2019   Lab Results  Component Value Date   INR 1.5 (H) 04/04/2019   CBG (last 3)  Recent Labs    04/03/19 2343 04/04/19 0318 04/04/19 0732  GLUCAP 132* 152* 188*    PROBLEM LIST:    Principal Problem:   Cardiac arrest (HCC) Active Problems:   ACC/AHA stage B congestive heart failure due to ischemic cardiomyopathy (HCC)   Peripheral vascular disease (HCC)   Acute respiratory failure (HCC)   Coronary artery disease involving native coronary artery of native heart with angina pectoris ([place])   Diabetes mellitus with peripheral vascular disease ([place])   Tobacco abuse   Ventricular tachycardia (HCC)   Acute on chronic combined systolic and diastolic CHF (congestive heart failure) (HCC)   Demand ischemia (HCC)   Critical lower limb ischemia   PVCs (premature ventricular  contractions)   CURRENT MEDS:   . amiodarone  400 mg Oral BID  . aspirin  81 mg Per Tube Daily  . chlorhexidine gluconate (MEDLINE KIT)  15 mL Mouth Rinse BID  . Chlorhexidine Gluconate Cloth  6 each Topical Daily  . EPINEPHrine      . famotidine  20 mg Per Tube QHS  . feeding supplement (PRO-STAT SUGAR FREE 64)  30 mL Per Tube BID  . insulin aspart  0-15 Units Subcutaneous Q4H  . mouth rinse  15 mL Mouth Rinse 10 times per day  . sodium chloride flush  10-40 mL Intracatheter Q12H    [name] Beeper: [phone] Office: [phone] 04/04/2019

## 2019-04-04 NOTE — Progress Notes (Signed)
Progress Note  Patient Name: [name] Date of Encounter: 04/04/2019  Primary Cardiologist: new, saw Dr. Ellyn Hack in consult, but from [facility] area  Subjective   Intubated, sedated  Inpatient Medications    Scheduled Meds: . amiodarone  400 mg Oral BID  . aspirin  81 mg Per Tube Daily  . chlorhexidine gluconate (MEDLINE KIT)  15 mL Mouth Rinse BID  . Chlorhexidine Gluconate Cloth  6 each Topical Daily  . EPINEPHrine      . famotidine  20 mg Per Tube QHS  . feeding supplement (PRO-STAT SUGAR FREE 64)  30 mL Per Tube BID  . insulin aspart  0-15 Units Subcutaneous Q4H  . mouth rinse  15 mL Mouth Rinse 10 times per day  . sodium chloride flush  10-40 mL Intracatheter Q12H   Continuous Infusions: . sodium chloride 10 mL/hr at 04/04/19 0800  . [START ON 03/31/2019]  ceFAZolin (ANCEF) IV    . cefTRIAXone (ROCEPHIN)  IV Stopped (04/03/19 2135)  . dexmedetomidine Stopped (03/31/19 1147)  . feeding supplement (VITAL AF 1.2 CAL) Stopped (04/03/19 2130)  . fentaNYL infusion INTRAVENOUS 75 mcg/hr (04/04/19 0800)  . heparin 1,700 Units/hr (04/04/19 0800)  . metronidazole 500 mg (04/04/19 0809)  . norepinephrine (LEVOPHED) Adult infusion 2 mcg/min (04/03/19 0257)  . propofol (DIPRIVAN) infusion 2 mcg/kg/min (04/03/19 2115)  .  sodium bicarbonate  infusion 1000 mL 75 mL/hr at 04/04/19 0800  . vasopressin (PITRESSIN) infusion - *FOR SHOCK*     PRN Meds: acetaminophen (TYLENOL) oral liquid 160 mg/5 mL, fentaNYL (SUBLIMAZE) injection, fentaNYL (SUBLIMAZE) injection, sodium chloride flush   Vital Signs    Vitals:   04/04/19 0600 04/04/19 0700 04/04/19 0754 04/04/19 0800  BP: 104/71 104/72 99/66 103/71  Pulse: 80 78 80 77  Resp: 20 19 19  (!) 21  Temp: 99.9 F (37.7 C) 99.9 F (37.7 C) 99.7 F (37.6 C) 99.7 F (37.6 C)  TempSrc:    Bladder  SpO2: 98% 98% 98% 100%  Weight:      Height:        Intake/Output Summary (Last 24 hours) at 04/04/2019 0853 Last data filed at  04/04/2019 0800 Gross per 24 hour  Intake 3330.16 ml  Output 425 ml  Net 2905.16 ml    I/O since admission: +6316  Filed Weights   04/03/19 0030 04/03/19 0441 04/04/19 0319  Weight: 97.2 kg 97.2 kg 105.7 kg    Telemetry    Sinus rhythm 78 - Personally Reviewed  Significant improvement in ectopy now on oral amiodarone  ECG    7/29/2020ECG (independently read by me): NSR with sinus arrythmia 89; first degree AV block  ECG (independently read by me): NSR at 74; Ist degree AV block; PR 246 msec; NST changes  Physical Exam   BP 103/71 (BP Location: Left Arm)   Pulse 77   Temp 99.7 F (37.6 C) (Bladder)   Resp (!) 21   Ht 6' (1.829 m)   Wt 105.7 kg   SpO2 100%   BMI 31.60 kg/m  General: Alert, oriented, no distress.  Skin: normal turgor, no rashes, warm and dry HEENT: Normocephalic, atraumatic. Pupils equal round and reactive to light; sclera anicteric; extraocular muscles intact;  Nose without nasal septal hypertrophy Mouth/Parynx Intubated on vent Neck: No JVD, no carotid bruits; normal carotid upstroke Lungs:  Decreased BS Chest wall: without tenderness to palpitation Heart: PMI not displaced, RRR in th 70s, s1 s2 normal, 1/6 systolic murmur, no diastolic murmur, no rubs, gallops,  thrills, or heaves Abdomen: soft, nontender; no hepatosplenomehaly, BS+; abdominal aorta nontender and not dilated by palpation. Back: no CVA tenderness Pulses 2+ Musculoskeletal: full range of motion, normal strength, no joint deformities Extremities:L AKA: progressing mottling of cold RLE to above knee Neurologic: grossly nonfocal; Cranial nerves grossly wnl    Labs    Chemistry Recent Labs  Lab 03/31/19 0110  04/03/19 0358  04/03/19 2306 04/03/19 2316 04/04/19 0209  NA 136   < > 137   < > 139 140 139  K 4.5   < > 4.3   < > 4.2 4.1 3.8  CL 102   < > 107  --  104  --  106  CO2 23   < > 20*  --  20*  --  20*  GLUCOSE 145*   < > 150*  --  142*  --  177*  BUN 30*   < > 50*  --   63*  --  64*  CREATININE 1.77*   < > 2.47*  --  2.70*  --  2.76*  CALCIUM 8.4*   < > 7.9*  --  7.9*  --  8.1*  PROT 5.4*  --   --   --  5.5*  --  5.3*  ALBUMIN 2.8*  --   --   --  2.3*  --  2.3*  AST 19  --   --   --  948*  --  825*  ALT 17  --   --   --  448*  --  439*  ALKPHOS 59  --   --   --  214*  --  195*  BILITOT 0.6  --   --   --  0.3  --  0.6  GFRNONAA 36*   < > 24*  --  22*  --  21*  GFRAA 42*   < > 28*  --  25*  --  25*  ANIONGAP 11   < > 10  --  15  --  13   < > = values in this interval not displayed.     Hematology Recent Labs  Lab 04/03/19 0358  04/03/19 2306 04/03/19 2316 04/04/19 0209  WBC 6.3  --  8.3  --  9.4  RBC 3.96*  --  4.60  --  4.51  HGB 11.8*   < > 13.5 14.3 13.2  HCT 36.6*   < > 42.3 42.0 40.8  MCV 92.4  --  92.0  --  90.5  MCH 29.8  --  29.3  --  29.3  MCHC 32.2  --  31.9  --  32.4  RDW 17.0*  --  17.1*  --  16.8*  PLT 213  --  201  --  193   < > = values in this interval not displayed.    Cardiac EnzymesNo results for input(s): TROPONINI in the last 168 hours. No results for input(s): TROPIPOC in the last 168 hours.   BNP Recent Labs  Lab 03/11/2019 1800 03/31/19 0224  BNP 483.0* 634.4*     DDimer No results for input(s): DDIMER in the last 168 hours.   Lipid Panel     Component Value Date/Time   CHOL  09/02/2010 0531    175        ATP III CLASSIFICATION:  <200     mg/dL   Desirable  200-239  mg/dL   Borderline High  >=240    mg/dL   High  TRIG 75 04/02/2019 0413   HDL 71 09/02/2010 0531   CHOLHDL 2.5 09/02/2010 0531   VLDL 38 09/02/2010 0531   LDLCALC  09/02/2010 0531    66        Total Cholesterol/HDL:CHD Risk Coronary Heart Disease Risk Table                     Men   Women  1/2 Average Risk   3.4   3.3  Average Risk       5.0   4.4  2 X Average Risk   9.6   7.1  3 X Average Risk  23.4   11.0        Use the calculated Patient Ratio above and the CHD Risk Table to determine the patient's CHD Risk.         ATP III CLASSIFICATION (LDL):  <100     mg/dL   Optimal  100-129  mg/dL   Near or Above                    Optimal  130-159  mg/dL   Borderline  160-189  mg/dL   High  >190     mg/dL   Very High       Radiology    Dg Abd 1 View  Result Date: 04/03/2019 CLINICAL DATA:  Hypoxia EXAM: ABDOMEN - 1 VIEW COMPARISON:  06/03/2017 FINDINGS: Three images of the abdomen are submitted. Cardiomegaly and basilar airspace disease. Final image with time stamp at 10:39 p.m. demonstrates an esophageal tube with the side port in the region of the GE junction. Visible upper gas pattern is nonobstructed. IMPRESSION: Esophageal tube tip overlies proximal stomach, side-port in the region of GE junction. Suggest further advancement for more optimal positioning Electronically Signed   By: Donavan Foil M.D.   On: 04/03/2019 23:07   Dg Chest Port 1 View  Result Date: 04/03/2019 CLINICAL DATA:  Hypoxia EXAM: PORTABLE CHEST 1 VIEW COMPARISON:  04/03/2019, 04/01/2019 FINDINGS: Endotracheal tube tip is about 5.8 cm superior to the carina. Esophageal tube has been removed. Right IJ central venous catheter tip over the distal jugular region. Post sternotomy changes. Small bilateral pleural effusions. Slightly improved aeration at the right base. Persistent consolidation left lung base. Cardiomegaly with vascular congestion and mild diffuse interstitial opacities suspicious for edema. Aortic atherosclerosis. No pneumothorax. IMPRESSION: 1. Endotracheal tube tip about 5.8 cm superior from the carina. Esophageal tube has been removed 2. Slightly improved aeration of right lung base. 3. Cardiomegaly with vascular congestion and mild interstitial edema. Residual small pleural effusions and persistent dense airspace disease at the left base. Mild hazy edema or infiltrate at the right base. Electronically Signed   By: Donavan Foil M.D.   On: 04/03/2019 23:05   Dg Chest Port 1 View  Result Date: 04/03/2019 CLINICAL DATA:   Endotracheal tube EXAM: PORTABLE CHEST 1 VIEW COMPARISON:  04/01/2019 FINDINGS: Endotracheal tube in good position. Right jugular catheter tip SVC. NG tube in the stomach. No pneumothorax Bilateral pleural effusions and bibasilar airspace disease right greater than left is unchanged. No edema. IMPRESSION: Endotracheal tube and central line in good position Bibasilar airspace disease and pleural effusion unchanged. Electronically Signed   By: Franchot Gallo M.D.   On: 04/03/2019 11:38   Dg Abd Portable 1v  Result Date: 04/04/2019 CLINICAL DATA:  OG tube placement EXAM: PORTABLE ABDOMEN - 1 VIEW COMPARISON:  04/03/2019 FINDINGS: Enteric tube terminates in the mid gastric  body. IMPRESSION: Enteric tube terminates in the mid gastric body. Electronically Signed   By: Julian Hy M.D.   On: 04/04/2019 01:53    Cardiac Studies   ECHO IMPRESSIONS: 03/31/2019  1. The left ventricle has severely reduced systolic function, with an ejection fraction of 25-30%. The cavity size was normal. Indeterminate diastolic filling due to E-A fusion.  2. LVEF is severely depressed; proximal portions of LV move the best but are not normal.  3. The right ventricle has normal systolic function. The cavity was mildly enlarged. There is no increase in right ventricular wall thickness.  4. Left atrial size was mildly dilated.  5. Right atrial size was moderately dilated.  6. The mitral valve is grossly normal. There is mild mitral annular calcification present. Mitral valve regurgitation is mild to moderate by color flow Doppler.  7. Tricuspid valve regurgitation is moderate.  8. The inferior vena cava was dilated in size with <50% respiratory variability.  9. The left ventricular function has worsened.  Patient Profile     [name] is a 78 y.o. male with a hx of CAD-prior MI with CABG (per family CABG x5 was in 2004 -[place] -by report, has had at least one catheterization  prior to his AKA with no stents.) resulting in ischemic cardiomyopathy with chronic combined systolic and diastolic heart failure), COPD, PAD (right SFA stent, left AKA), hypertension who suffered a cardiac arrest 03/21/2019.  Assessment & Plan    1. Day 4 s/p VT cardiac arrest with 1 shock and restoration of ROSC. Will need future ICD for secondary prevention for subsequent cardiac arrest.  2. Combined cardiomyopathy:  EF 25- 30%: currently  EF prior to arrest not yet known.      BNP 634 on 7/26. Levophed resumed; BP 100/70; carvedilol off. Will f/u BNP today with surgery planned for tomorrow.  3. Cardiogenic shock:  Was on levophed; weaned and dc'd and resumed last night.  BP now 130/82. Will continue low dose carvedilol. Not candidate for ACE-I/ARB/ spiro with Cr, but if further stabiles can add hydralazine.  4.  CAD/ s/p CABG in [place], [place]. Will need to obtain data.  Does not appear to be an acute MI/STEMI.  HS Troponin is low at 89.  Depending on neurologic function and future PVD surgery, will need future ischemic evaluation, but with only one kidney and Cr increasing would not give contrast  5. PAF/ occasional to frequent PVCs: Amiodarone was dc'd by EP. PVC today; Amiodarone started orally 7/29 at 400 mg bid for 5 days;  Ectopy essentially resolved today. Mg repleted; 2.1 today  5. PVD: prior R AKA, now with ischemic cold R foot with non-retrievable capillary bed, felt to be non-salvageable; will need R AKA. Risk is high but agree necessary due to ongoing risk of ischemic leg. Plan for surgery tomorrow with Dr. Scot Dock  6. AKI: Previous left nephrectomy with baseline Stage 3 CKD.  Cr increased today to 2.76 (1.55 on 7/25)  7. Respiratory Failure: intubated followed by CCM; ETT tube was repositioned.   8. Aspiration pneumonia: Flagyl added  to Rocephin for improved anaerobic coverage  9. F/U lipid panel once off propothol   CC: 35 minutes  Signed, Troy Sine, MD, [facility]  04/04/2019, 8:53 AM

## 2019-04-04 NOTE — Progress Notes (Signed)
ABG drawn and results reported to MD.   PH: 7.32 CO2:42 PO2:46 HCO3:22  MD made aware and changed PEEP to 12. BP fine at this time. Sats are at 95%.

## 2019-04-04 NOTE — Progress Notes (Signed)
ANTICOAGULATION CONSULT NOTE - [place] for heparin Indication: chest pain/ACS  Allergies  Allergen Reactions  . Erythromycin Anaphylaxis and Rash  . Iohexol Anaphylaxis, Hives, Itching and Other (See Comments)    Note:  13 HR PRE-MED GIVEN AND SUCCESSFUL-ARS 07/15/07.     Marland Kitchen Penicillins Anaphylaxis, Itching and Other (See Comments)    Has patient had a PCN reaction causing immediate rash, facial/tongue/throat swelling, SOB or lightheadedness with hypotension: Yes Has patient had a PCN reaction causing severe rash involving mucus membranes or skin necrosis: No Has patient had a PCN reaction that required hospitalization No Has patient had a PCN reaction occurring within the last 10 years: No If all of the above answers are "NO", then may proceed with Cephalosporin use.    Patient Measurements: Height: 6' (182.9 cm) Weight: 233 lb 0.4 oz (105.7 kg) IBW/kg (Calculated) : 77.6 Heparin Dosing Weight: 97.3 kg  Vital Signs: Temp: 99.5 F (37.5 C) (07/30 1430) Temp Source: Bladder (07/30 1200) BP: 102/66 (07/30 1430) Pulse Rate: 76 (07/30 1430)  Labs: Recent Labs    04/02/19 0413 04/03/19 0358  04/03/19 2306 04/03/19 2316 04/04/19 0209 04/04/19 1034 04/04/19 1126  HGB 11.2* 11.8*   < > 13.5 14.3 13.2 13.6  --   HCT 35.1* 36.6*   < > 42.3 42.0 40.8 40.0  --   PLT 196 213  --  201  --  193  --   --   LABPROT 16.1* 15.8*  --   --   --  17.4*  --   --   INR 1.3* 1.3*  --   --   --  1.5*  --   --   HEPARINUNFRC 0.37 0.33  --   --   --  0.23*  --  0.23*  CREATININE 2.48* 2.47*  --  2.70*  --  2.76*  --   --   TROPONINIHS  --   --   --  128*  --   --   --   --    < > = values in this interval not displayed.    Estimated Creatinine Clearance: 28.6 mL/min (A) (by C-G formula based on SCr of 2.76 mg/dL (H)).   Medical History: Past Medical History:  Diagnosis Date  . Arthritis    "qwhere" (07/09/2013)  . Asthma   . COPD (chronic obstructive  pulmonary disease) ([place])    pt denies this hx on 07/09/2013; has chronic dyspnea  . Coronary artery disease    Reportedly multivessel disease-CABG  . Gout   . H/O hiatal hernia 1997  . H/O: rheumatic fever    "had rheumatic fever as a kid" (07/09/2013) -has murmur  . Hyperlipidemia with target LDL less than 70   . Hypertension   . Ischemic cardiomyopathy    By report,-ICD  . Myocardial infarction ([place]) 03/2002; ~ 2011  . Neuropathy   . PAD (peripheral artery disease) ([place]) 2014   Right SFA stent November 2014; status post left AKA  . Pneumonia    "once" (07/09/2013)  . Renal disorder    "only have 1 kidney; due to go back soon to check the other one" (07/09/2013)  . RUPTURE ROTATOR CUFF 12/03/2009   Qualifier: Diagnosis of  By: Aline Brochure MD, Dorothyann Peng    . Type II diabetes mellitus ([place])   . Ureter cancer ([place]) 12/2001   "shut down my kidney & resulted in nephrectomy" (07/09/2013)  . UTI (urinary tract infection) 10/04/2018    Assessment:  78 yo male with VT and concern for ACS (hx MI and CABG) also with ischemic right foot that will most likely  require R AKA. Pharmacy consulted for heparin for ACS. Heparin level subtherapeutic at heparin level of 0.23 on 1700 units/hr. Hgb 13.6. Plt WNL. No reported bleeding or issues with infusion.  Goal of Therapy:  Heparin level 0.3-0.7 units/ml Monitor platelets by anticoagulation protocol: Yes   Plan:  Increase heparin to 1900 units/hr Check next heparin level in 6 hours at 2100 Monitor daily heparin level, CBC, and S/S of bleeding  Sherren Kerns, PharmD PGY1 Acute Care Pharmacy Resident [phone]

## 2019-04-04 NOTE — Progress Notes (Signed)
[place] for heparin Indication: chest pain/ACS  Allergies  Allergen Reactions  . Erythromycin Anaphylaxis and Rash  . Iohexol Anaphylaxis, Hives, Itching and Other (See Comments)    Note:  13 HR PRE-MED GIVEN AND SUCCESSFUL-ARS 07/15/07.     Marland Kitchen Penicillins Anaphylaxis, Itching and Other (See Comments)    Has patient had a PCN reaction causing immediate rash, facial/tongue/throat swelling, SOB or lightheadedness with hypotension: Yes Has patient had a PCN reaction causing severe rash involving mucus membranes or skin necrosis: No Has patient had a PCN reaction that required hospitalization No Has patient had a PCN reaction occurring within the last 10 years: No If all of the above answers are "NO", then may proceed with Cephalosporin use.    Patient Measurements: Height: 6' (182.9 cm) Weight: 233 lb 0.4 oz (105.7 kg) IBW/kg (Calculated) : 77.6 Heparin Dosing Weight: 97.3 kg  Vital Signs: Temp: 99.7 F (37.6 C) (07/30 0403) Temp Source: Core (07/30 0316) BP: 98/65 (07/30 0400) Pulse Rate: 74 (07/30 0403)  Labs: Recent Labs    04/02/19 0413 04/03/19 0358  04/03/19 2306 04/03/19 2316 04/04/19 0209  HGB 11.2* 11.8*   < > 13.5 14.3 13.2  HCT 35.1* 36.6*   < > 42.3 42.0 40.8  PLT 196 213  --  201  --  193  LABPROT 16.1* 15.8*  --   --   --  17.4*  INR 1.3* 1.3*  --   --   --  1.5*  HEPARINUNFRC 0.37 0.33  --   --   --  0.23*  CREATININE 2.48* 2.47*  --  2.70*  --  2.76*  TROPONINIHS  --   --   --  128*  --   --    < > = values in this interval not displayed.    Estimated Creatinine Clearance: 28.6 mL/min (A) (by C-G formula based on SCr of 2.76 mg/dL (H)).  Assessment: 78 yo male s/p VT arrest, poss ACS and ischemic right foot for heparin  Goal of Therapy:  Heparin level 0.3-0.7 units/ml Monitor platelets by anticoagulation protocol: Yes   Plan:  Increase Heparin 1700 units/hr   Phillis Knack, PharmD, BCPS

## 2019-04-04 NOTE — Progress Notes (Signed)
   VASCULAR SURGERY ASSESSMENT & PLAN:   ISCHEMIC RIGHT LOWER EXTREMITY: The ischemic changes to his right lower extremity continue to progress.  He has mottling up to above the knee.  I plan on proceeding with right above-the-knee amputation tomorrow if he is medically stable.  I have tried to contact his wife at both the home number and her mobile number.  I have written preop orders.  I will have the nurse stop his heparin on-call to the OR tomorrow.  SUBJECTIVE:   Sedated on vent  PHYSICAL EXAM:   Vitals:   04/04/19 0600 04/04/19 0700 04/04/19 0754 04/04/19 0800  BP: 104/71 104/72 99/66 103/71  Pulse: 80 78 80 77  Resp: '20 19 19 '$ (!) 21  Temp: 99.9 F (37.7 C) 99.9 F (37.7 C) 99.7 F (37.6 C) 99.7 F (37.6 C)  TempSrc:    Bladder  SpO2: 98% 98% 98% 100%  Weight:      Height:       Worsening ischemic changes to the right lower extremity with mottling up to above the knee.  LABS:   Lab Results  Component Value Date   WBC 9.4 04/04/2019   HGB 13.2 04/04/2019   HCT 40.8 04/04/2019   MCV 90.5 04/04/2019   PLT 193 04/04/2019   Lab Results  Component Value Date   CREATININE 2.76 (H) 04/04/2019   Lab Results  Component Value Date   INR 1.5 (H) 04/04/2019   CBG (last 3)  Recent Labs    04/03/19 2343 04/04/19 0318 04/04/19 0732  GLUCAP 132* 152* 188*    PROBLEM LIST:    Principal Problem:   Cardiac arrest (HCC) Active Problems:   ACC/AHA stage B congestive heart failure due to ischemic cardiomyopathy (HCC)   Peripheral vascular disease (HCC)   Acute respiratory failure (HCC)   Coronary artery disease involving native coronary artery of native heart with angina pectoris ([place])   Diabetes mellitus with peripheral vascular disease ([place])   Tobacco abuse   Ventricular tachycardia (HCC)   Acute on chronic combined systolic and diastolic CHF (congestive heart failure) (HCC)   Demand ischemia (HCC)   Critical lower limb ischemia   PVCs (premature ventricular  contractions)   CURRENT MEDS:   . amiodarone  400 mg Oral BID  . aspirin  81 mg Per Tube Daily  . chlorhexidine gluconate (MEDLINE KIT)  15 mL Mouth Rinse BID  . Chlorhexidine Gluconate Cloth  6 each Topical Daily  . EPINEPHrine      . famotidine  20 mg Per Tube QHS  . feeding supplement (PRO-STAT SUGAR FREE 64)  30 mL Per Tube BID  . insulin aspart  0-15 Units Subcutaneous Q4H  . mouth rinse  15 mL Mouth Rinse 10 times per day  . sodium chloride flush  10-40 mL Intracatheter Q12H    [name] Beeper: [phone] Office: [phone] 04/04/2019

## 2019-04-04 NOTE — Progress Notes (Signed)
eLink Physician-Brief Progress Note Patient Name: [name] DOB: May 14, 1941 MRN: [id number]   Date of Service  04/04/2019  HPI/Events of Note  Called with Lactic Acid = 2.2. BP = 119/77, CVP = 15 and Hgb = 14.3. LVEF = 25-30%  eICU Interventions  Will order: 1. COOX now.      Intervention Category Major Interventions: Acid-Base disturbance - evaluation and management  Sommer,Steven Eugene 04/04/2019, 12:48 AM

## 2019-04-05 ENCOUNTER — Inpatient Hospital Stay (HOSPITAL_COMMUNITY): Payer: Medicare HMO | Admitting: Certified Registered"

## 2019-04-05 ENCOUNTER — Inpatient Hospital Stay (HOSPITAL_COMMUNITY): Payer: Medicare HMO

## 2019-04-05 ENCOUNTER — Encounter (HOSPITAL_COMMUNITY): Admission: EM | Disposition: E | Payer: Self-pay | Source: Home / Self Care | Attending: Pulmonary Disease

## 2019-04-05 DIAGNOSIS — R570 Cardiogenic shock: Secondary | ICD-10-CM

## 2019-04-05 HISTORY — PX: AMPUTATION: SHX166

## 2019-04-05 LAB — CBC
HCT: 36.8 % — ABNORMAL LOW (ref 39.0–52.0)
Hemoglobin: 12.1 g/dL — ABNORMAL LOW (ref 13.0–17.0)
MCH: 29 pg (ref 26.0–34.0)
MCHC: 32.9 g/dL (ref 30.0–36.0)
MCV: 88.2 fL (ref 80.0–100.0)
Platelets: 174 10*3/uL (ref 150–400)
RBC: 4.17 MIL/uL — ABNORMAL LOW (ref 4.22–5.81)
RDW: 16.5 % — ABNORMAL HIGH (ref 11.5–15.5)
WBC: 7.1 10*3/uL (ref 4.0–10.5)
nRBC: 0 % (ref 0.0–0.2)

## 2019-04-05 LAB — TRIGLYCERIDES: Triglycerides: 54 mg/dL (ref <150)

## 2019-04-05 LAB — GLUCOSE, CAPILLARY
Glucose-Capillary: 179 mg/dL — ABNORMAL HIGH (ref 70–99)
Glucose-Capillary: 144 mg/dL — ABNORMAL HIGH (ref 70–99)
Glucose-Capillary: 131 mg/dL — ABNORMAL HIGH (ref 70–99)
Glucose-Capillary: 154 mg/dL — ABNORMAL HIGH (ref 70–99)
Glucose-Capillary: 141 mg/dL — ABNORMAL HIGH (ref 70–99)

## 2019-04-05 LAB — BASIC METABOLIC PANEL
Anion gap: 10 (ref 5–15)
BUN: 60 mg/dL — ABNORMAL HIGH (ref 8–23)
CO2: 27 mmol/L (ref 22–32)
Calcium: 7.7 mg/dL — ABNORMAL LOW (ref 8.9–10.3)
Chloride: 106 mmol/L (ref 98–111)
Creatinine, Ser: 2.09 mg/dL — ABNORMAL HIGH (ref 0.61–1.24)
GFR calc Af Amer: 35 mL/min — ABNORMAL LOW (ref >60)
GFR calc non Af Amer: 30 mL/min — ABNORMAL LOW (ref >60)
Glucose, Bld: 197 mg/dL — ABNORMAL HIGH (ref 70–99)
Potassium: 3.5 mmol/L (ref 3.5–5.1)
Sodium: 143 mmol/L (ref 135–145)

## 2019-04-05 LAB — CULTURE, BLOOD (ROUTINE X 2)
Culture: NO GROWTH
Culture: NO GROWTH
Special Requests: ADEQUATE
Special Requests: ADEQUATE

## 2019-04-05 LAB — PROTIME-INR
INR: 1.7 — ABNORMAL HIGH (ref 0.8–1.2)
Prothrombin Time: 20 seconds — ABNORMAL HIGH (ref 11.4–15.2)

## 2019-04-05 LAB — BRAIN NATRIURETIC PEPTIDE: B Natriuretic Peptide: 969.4 pg/mL — ABNORMAL HIGH (ref 0.0–100.0)

## 2019-04-05 LAB — HEPARIN LEVEL (UNFRACTIONATED): Heparin Unfractionated: 0.33 IU/mL (ref 0.30–0.70)

## 2019-04-05 SURGERY — AMPUTATION, ABOVE KNEE
Anesthesia: General | Laterality: Right

## 2019-04-05 MED ORDER — 0.9 % SODIUM CHLORIDE (POUR BTL) OPTIME
TOPICAL | Status: DC | PRN
  Administered 2019-04-05: 1000 mL

## 2019-04-05 MED ORDER — FENTANYL CITRATE (PF) 250 MCG/5ML IJ SOLN
INTRAMUSCULAR | Status: AC
  Filled 2019-04-05: qty 5

## 2019-04-05 MED ORDER — LACTATED RINGERS IV SOLN
INTRAVENOUS | Status: DC | PRN
  Administered 2019-04-05: 12:00:00 via INTRAVENOUS

## 2019-04-05 MED ORDER — NOREPINEPHRINE BITARTRATE 1 MG/ML IV SOLN
INTRAVENOUS | Status: DC | PRN
  Administered 2019-04-05: 7 ug/min via INTRAVENOUS

## 2019-04-05 MED ORDER — BACITRACIN ZINC 500 UNIT/GM EX OINT
TOPICAL_OINTMENT | CUTANEOUS | Status: DC | PRN
  Administered 2019-04-05: 1 via TOPICAL

## 2019-04-05 MED ORDER — ALBUMIN HUMAN 5 % IV SOLN
INTRAVENOUS | Status: DC | PRN
  Administered 2019-04-05: 12:00:00 via INTRAVENOUS

## 2019-04-05 MED ORDER — HEPARIN (PORCINE) 25000 UT/250ML-% IV SOLN
2450.0000 [IU]/h | INTRAVENOUS | Status: DC
  Administered 2019-04-05 – 2019-04-06 (×3): 2050 [IU]/h via INTRAVENOUS
  Administered 2019-04-07: 2350 [IU]/h via INTRAVENOUS
  Administered 2019-04-07 (×2): 2250 [IU]/h via INTRAVENOUS
  Administered 2019-04-08 – 2019-04-11 (×7): 2450 [IU]/h via INTRAVENOUS
  Filled 2019-04-05 (×13): qty 250

## 2019-04-05 MED ORDER — BACITRACIN ZINC 500 UNIT/GM EX OINT
TOPICAL_OINTMENT | CUTANEOUS | Status: AC
  Filled 2019-04-05: qty 28.35

## 2019-04-05 MED ORDER — ROCURONIUM BROMIDE 50 MG/5ML IV SOSY
PREFILLED_SYRINGE | INTRAVENOUS | Status: DC | PRN
  Administered 2019-04-05: 40 mg via INTRAVENOUS

## 2019-04-05 MED ORDER — POTASSIUM CHLORIDE 20 MEQ/15ML (10%) PO SOLN
40.0000 meq | Freq: Once | ORAL | Status: AC
  Administered 2019-04-05: 16:00:00 40 meq
  Filled 2019-04-05: qty 30

## 2019-04-05 SURGICAL SUPPLY — 55 items
BANDAGE ELASTIC 4 VELCRO ST LF (GAUZE/BANDAGES/DRESSINGS) ×4 IMPLANT
BANDAGE ESMARK 6X9 LF (GAUZE/BANDAGES/DRESSINGS) ×1 IMPLANT
BLADE SAW RECIP 87.9 MT (BLADE) ×3 IMPLANT
BNDG CMPR 9X6 STRL LF SNTH (GAUZE/BANDAGES/DRESSINGS) ×1
BNDG COHESIVE 6X5 TAN STRL LF (GAUZE/BANDAGES/DRESSINGS) ×3 IMPLANT
BNDG ELASTIC 4X5.8 VLCR STR LF (GAUZE/BANDAGES/DRESSINGS) ×6 IMPLANT
BNDG ESMARK 6X9 LF (GAUZE/BANDAGES/DRESSINGS) ×3
BNDG GAUZE ELAST 4 BULKY (GAUZE/BANDAGES/DRESSINGS) ×5 IMPLANT
CANISTER SUCT 3000ML PPV (MISCELLANEOUS) ×3 IMPLANT
COVER SURGICAL LIGHT HANDLE (MISCELLANEOUS) ×7 IMPLANT
COVER WAND RF STERILE (DRAPES) ×3 IMPLANT
CUFF TOURN SGL QUICK 18X4 (TOURNIQUET CUFF) IMPLANT
CUFF TOURN SGL QUICK 24 (TOURNIQUET CUFF)
CUFF TOURN SGL QUICK 34 (TOURNIQUET CUFF) ×3
CUFF TOURN SGL QUICK 42 (TOURNIQUET CUFF) IMPLANT
CUFF TRNQT CYL 24X4X16.5-23 (TOURNIQUET CUFF) IMPLANT
CUFF TRNQT CYL 34X4.125X (TOURNIQUET CUFF) IMPLANT
DRAIN CHANNEL 19F RND (DRAIN) IMPLANT
DRAPE HALF SHEET 40X57 (DRAPES) ×3 IMPLANT
DRAPE ORTHO SPLIT 77X108 STRL (DRAPES) ×6
DRAPE SURG ORHT 6 SPLT 77X108 (DRAPES) ×2 IMPLANT
DRAPE U-SHAPE 47X51 STRL (DRAPES) ×3 IMPLANT
DRSG ADAPTIC 3X8 NADH LF (GAUZE/BANDAGES/DRESSINGS) ×3 IMPLANT
ELECT CAUTERY BLADE 6.4 (BLADE) ×3 IMPLANT
ELECT REM PT RETURN 9FT ADLT (ELECTROSURGICAL) ×3
ELECTRODE REM PT RTRN 9FT ADLT (ELECTROSURGICAL) ×1 IMPLANT
EVACUATOR SILICONE 100CC (DRAIN) IMPLANT
GAUZE SPONGE 4X4 12PLY STRL (GAUZE/BANDAGES/DRESSINGS) ×3 IMPLANT
GLOVE BIO SURGEON STRL SZ7.5 (GLOVE) ×3 IMPLANT
GLOVE BIOGEL PI IND STRL 8 (GLOVE) ×1 IMPLANT
GLOVE BIOGEL PI INDICATOR 8 (GLOVE) ×2
GLOVE SURG SS PI 7.0 STRL IVOR (GLOVE) ×2 IMPLANT
GOWN STRL REUS W/ TWL LRG LVL3 (GOWN DISPOSABLE) ×3 IMPLANT
GOWN STRL REUS W/TWL LRG LVL3 (GOWN DISPOSABLE) ×9
KIT BASIN OR (CUSTOM PROCEDURE TRAY) ×3 IMPLANT
KIT TURNOVER KIT B (KITS) ×3 IMPLANT
NS IRRIG 1000ML POUR BTL (IV SOLUTION) ×3 IMPLANT
PACK GENERAL/GYN (CUSTOM PROCEDURE TRAY) ×3 IMPLANT
PAD ARMBOARD 7.5X6 YLW CONV (MISCELLANEOUS) ×6 IMPLANT
RASP HELIOCORDIAL MED (MISCELLANEOUS) IMPLANT
STAPLER VISISTAT (STAPLE) ×3 IMPLANT
STAPLER VISISTAT 35W (STAPLE) ×4 IMPLANT
STOCKINETTE IMPERVIOUS LG (DRAPES) ×3 IMPLANT
SUT ETHILON 3 0 PS 1 (SUTURE) IMPLANT
SUT SILK 0 TIES 10X30 (SUTURE) ×3 IMPLANT
SUT SILK 2 0 (SUTURE) ×3
SUT SILK 2 0 SH CR/8 (SUTURE) ×5 IMPLANT
SUT SILK 2-0 18XBRD TIE 12 (SUTURE) ×1 IMPLANT
SUT SILK 3 0 (SUTURE) ×3
SUT SILK 3-0 18XBRD TIE 12 (SUTURE) ×1 IMPLANT
SUT VIC AB 2-0 CT1 18 (SUTURE) ×3 IMPLANT
TOWEL GREEN STERILE (TOWEL DISPOSABLE) ×6 IMPLANT
TOWEL GREEN STERILE FF (TOWEL DISPOSABLE) ×3 IMPLANT
UNDERPAD 30X30 (UNDERPADS AND DIAPERS) ×3 IMPLANT
WATER STERILE IRR 1000ML POUR (IV SOLUTION) ×3 IMPLANT

## 2019-04-05 NOTE — Progress Notes (Addendum)
Progress Note  Patient Name: [name] Date of Encounter: 03/10/2019  Primary Cardiologist: new, saw Dr. Ellyn Hack in consult, but from [facility] area  Subjective   Intubated, sedated on vent  Inpatient Medications    Scheduled Meds: . amiodarone  400 mg Per Tube BID  . aspirin  81 mg Per Tube Daily  . chlorhexidine gluconate (MEDLINE KIT)  15 mL Mouth Rinse BID  . Chlorhexidine Gluconate Cloth  6 each Topical Daily  . famotidine  20 mg Per Tube QHS  . feeding supplement (PRO-STAT SUGAR FREE 64)  30 mL Per Tube BID  . insulin aspart  0-15 Units Subcutaneous Q4H  . ipratropium-albuterol  3 mL Nebulization Q4H  . mouth rinse  15 mL Mouth Rinse 10 times per day  . sodium chloride flush  10-40 mL Intracatheter Q12H  . sodium chloride HYPERTONIC  4 mL Nebulization BID   Continuous Infusions: . sodium chloride Stopped (04/04/19 2202)  .  ceFAZolin (ANCEF) IV    . cefTRIAXone (ROCEPHIN)  IV Stopped (04/04/19 2232)  . dexmedetomidine Stopped (03/31/19 1147)  . feeding supplement (VITAL AF 1.2 CAL) 60 mL/hr at 04/04/19 1200  . fentaNYL infusion INTRAVENOUS 100 mcg/hr (03/09/2019 0000)  . heparin 2,050 Units/hr (04/02/2019 0000)  . norepinephrine (LEVOPHED) Adult infusion 7 mcg/min (03/25/2019 0029)  . propofol (DIPRIVAN) infusion 7 mcg/kg/min (03/09/2019 0751)  .  sodium bicarbonate  infusion 1000 mL 75 mL/hr at 03/28/2019 0052   PRN Meds: acetaminophen (TYLENOL) oral liquid 160 mg/5 mL, fentaNYL (SUBLIMAZE) injection, fentaNYL (SUBLIMAZE) injection, sodium chloride flush   Vital Signs    Vitals:   04/04/2019 0500 03/18/2019 0600 03/31/2019 0744 03/28/2019 0746  BP: (!) 108/58 105/63 121/62   Pulse: 77 74 82   Resp:   19   Temp: 100.2 F (37.9 C) 99.9 F (37.7 C) 99.3 F (37.4 C)   TempSrc:      SpO2: 100% 100% 100% 100%  Weight:      Height:        Intake/Output Summary (Last 24 hours) at 03/17/2019 0858 Last data filed at 03/21/2019 0600 Gross per 24 hour  Intake 3047.08 ml   Output 1060 ml  Net 1987.08 ml    I/O since admission: +8316  Filed Weights   04/03/19 0030 04/03/19 0441 04/04/19 0319  Weight: 97.2 kg 97.2 kg 105.7 kg    Telemetry    Sinus rhythm 73 - Personally Reviewed  Significant improvement in ectopy now on oral amiodarone  ECG    7/29/2020ECG (independently read by me): NSR with sinus arrythmia 89; first degree AV block  ECG (independently read by me): NSR at 74; Ist degree AV block; PR 246 msec; NST changes  Physical Exam   BP 121/62   Pulse 82   Temp 99.3 F (37.4 C)   Resp 19   Ht 6' (1.829 m)   Wt 105.7 kg   SpO2 100%   BMI 31.60 kg/m  General: Alert, oriented, no distress.  Skin: normal turgor, no rashes, warm and dry HEENT: Normocephalic, atraumatic. Pupils equal round and reactive to light; sclera anicteric; extraocular muscles intact;  Nose without nasal septal hypertrophy Mouth/Parynx: intubated on vent Neck: No JVD, no carotid bruits; normal carotid upstroke Lungs: clear to ausculatation and percussion; no wheezing or rales Chest wall: without tenderness to palpitation Heart: PMI not displaced, RRR, s1 s2 normal, 1/6 systolic murmur, no diastolic murmur, no rubs, gallops, thrills, or heaves Abdomen: soft, nontender; no hepatosplenomehaly, BS+; abdominal aorta nontender  and not dilated by palpation. Significant scrotal edema Pulses 2+ Musculoskeletal: full range of motion, normal strength, no joint deformities Extremities: L AKA; ischemic cold LLE for amputation today Neurologic: grossly nonfocal    Labs    Chemistry Recent Labs  Lab 03/31/19 0110  04/03/19 2306  04/04/19 0209 04/04/19 1034 03/19/2019 0448  NA 136   < > 139   < > 139 141 143  K 4.5   < > 4.2   < > 3.8 3.7 3.5  CL 102   < > 104  --  106  --  106  CO2 23   < > 20*  --  20*  --  27  GLUCOSE 145*   < > 142*  --  177*  --  197*  BUN 30*   < > 63*  --  64*  --  60*  CREATININE 1.77*   < > 2.70*  --  2.76*  --  2.09*  CALCIUM 8.4*   < >  7.9*  --  8.1*  --  7.7*  PROT 5.4*  --  5.5*  --  5.3*  --   --   ALBUMIN 2.8*  --  2.3*  --  2.3*  --   --   AST 19  --  948*  --  825*  --   --   ALT 17  --  448*  --  439*  --   --   ALKPHOS 59  --  214*  --  195*  --   --   BILITOT 0.6  --  0.3  --  0.6  --   --   GFRNONAA 36*   < > 22*  --  21*  --  30*  GFRAA 42*   < > 25*  --  25*  --  35*  ANIONGAP 11   < > 15  --  13  --  10   < > = values in this interval not displayed.     Hematology Recent Labs  Lab 04/03/19 2306  04/04/19 0209 04/04/19 1034 03/14/2019 0448  WBC 8.3  --  9.4  --  7.1  RBC 4.60  --  4.51  --  4.17*  HGB 13.5   < > 13.2 13.6 12.1*  HCT 42.3   < > 40.8 40.0 36.8*  MCV 92.0  --  90.5  --  88.2  MCH 29.3  --  29.3  --  29.0  MCHC 31.9  --  32.4  --  32.9  RDW 17.1*  --  16.8*  --  16.5*  PLT 201  --  193  --  174   < > = values in this interval not displayed.    Cardiac EnzymesNo results for input(s): TROPONINI in the last 168 hours. No results for input(s): TROPIPOC in the last 168 hours.   BNP Recent Labs  Lab 03/08/2019 1800 03/31/19 0224  BNP 483.0* 634.4*     DDimer No results for input(s): DDIMER in the last 168 hours.   Lipid Panel     Component Value Date/Time   CHOL  09/02/2010 0531    175        ATP III CLASSIFICATION:  <200     mg/dL   Desirable  200-239  mg/dL   Borderline High  >=240    mg/dL   High          TRIG 54 04/01/2019 0448   HDL 71 09/02/2010  0531   CHOLHDL 2.5 09/02/2010 0531   VLDL 38 09/02/2010 0531   LDLCALC  09/02/2010 0531    66        Total Cholesterol/HDL:CHD Risk Coronary Heart Disease Risk Table                     Men   Women  1/2 Average Risk   3.4   3.3  Average Risk       5.0   4.4  2 X Average Risk   9.6   7.1  3 X Average Risk  23.4   11.0        Use the calculated Patient Ratio above and the CHD Risk Table to determine the patient's CHD Risk.        ATP III CLASSIFICATION (LDL):  <100     mg/dL   Optimal  100-129  mg/dL   Near or  Above                    Optimal  130-159  mg/dL   Borderline  160-189  mg/dL   High  >190     mg/dL   Very High       Radiology    Dg Abd 1 View  Result Date: 04/03/2019 CLINICAL DATA:  Hypoxia EXAM: ABDOMEN - 1 VIEW COMPARISON:  06/03/2017 FINDINGS: Three images of the abdomen are submitted. Cardiomegaly and basilar airspace disease. Final image with time stamp at 10:39 p.m. demonstrates an esophageal tube with the side port in the region of the GE junction. Visible upper gas pattern is nonobstructed. IMPRESSION: Esophageal tube tip overlies proximal stomach, side-port in the region of GE junction. Suggest further advancement for more optimal positioning Electronically Signed   By: Donavan Foil M.D.   On: 04/03/2019 23:07   Dg Chest Port 1 View  Result Date: 03/27/2019 CLINICAL DATA:  Intubation. EXAM: PORTABLE CHEST 1 VIEW COMPARISON:  04/03/2019. FINDINGS: NG tube noted with tip over the stomach. Endotracheal tube and right IJ line stable position. Prior CABG. Cardiomegaly with diffuse bilateral pulmonary interstitial prominence and small bilateral pleural effusions. Dense left lung base atelectasis/consolidation. No pneumothorax. IMPRESSION: 1. NG tube noted with tip over the stomach. Endotracheal tube and right IJ line stable position. 2. Prior CABG. Cardiomegaly with pulmonary venous congestion and diffuse bilateral pulmonary interstitial prominence. Bilateral pleural effusions. Findings suggest CHF. 3.  Dense left lung base atelectasis/consolidation. Electronically Signed   By: Marcello Moores  Register   On: 03/25/2019 06:31   Dg Chest Port 1 View  Result Date: 04/03/2019 CLINICAL DATA:  Hypoxia EXAM: PORTABLE CHEST 1 VIEW COMPARISON:  04/03/2019, 04/01/2019 FINDINGS: Endotracheal tube tip is about 5.8 cm superior to the carina. Esophageal tube has been removed. Right IJ central venous catheter tip over the distal jugular region. Post sternotomy changes. Small bilateral pleural effusions.  Slightly improved aeration at the right base. Persistent consolidation left lung base. Cardiomegaly with vascular congestion and mild diffuse interstitial opacities suspicious for edema. Aortic atherosclerosis. No pneumothorax. IMPRESSION: 1. Endotracheal tube tip about 5.8 cm superior from the carina. Esophageal tube has been removed 2. Slightly improved aeration of right lung base. 3. Cardiomegaly with vascular congestion and mild interstitial edema. Residual small pleural effusions and persistent dense airspace disease at the left base. Mild hazy edema or infiltrate at the right base. Electronically Signed   By: Donavan Foil M.D.   On: 04/03/2019 23:05   Dg Abd Portable 1v  Result Date: 04/04/2019  CLINICAL DATA:  OG tube placement EXAM: PORTABLE ABDOMEN - 1 VIEW COMPARISON:  04/03/2019 FINDINGS: Enteric tube terminates in the mid gastric body. IMPRESSION: Enteric tube terminates in the mid gastric body. Electronically Signed   By: Julian Hy M.D.   On: 04/04/2019 01:53    Cardiac Studies   ECHO IMPRESSIONS: 03/31/2019  1. The left ventricle has severely reduced systolic function, with an ejection fraction of 25-30%. The cavity size was normal. Indeterminate diastolic filling due to E-A fusion.  2. LVEF is severely depressed; proximal portions of LV move the best but are not normal.  3. The right ventricle has normal systolic function. The cavity was mildly enlarged. There is no increase in right ventricular wall thickness.  4. Left atrial size was mildly dilated.  5. Right atrial size was moderately dilated.  6. The mitral valve is grossly normal. There is mild mitral annular calcification present. Mitral valve regurgitation is mild to moderate by color flow Doppler.  7. Tricuspid valve regurgitation is moderate.  8. The inferior vena cava was dilated in size with <50% respiratory variability.  9. The left ventricular function has worsened.  Patient Profile     [name] is a 78  y.o. male with a hx of CAD-prior MI with CABG (per family CABG x5 was in 2004 -[place] -by report, has had at least one catheterization prior to his AKA with no stents.) resulting in ischemic cardiomyopathy with chronic combined systolic and diastolic heart failure), COPD, PAD (right SFA stent, left AKA), hypertension who suffered a cardiac arrest 03/15/2019.  Assessment & Plan    1. Day 5 s/p VT cardiac arrest with 1 shock and restoration of ROSC. Will need future ICD for secondary prevention for subsequent cardiac arrest.  2. Combined cardiomyopathy:  EF 25- 30%: currently  EF prior to arrest not yet known.      BNP 634 on 7/26. Levophed resumed; carvedilol off; f/u BNP  3. Cardiogenic shock:  Still on levophed at 7 ug/k/min. BP 113/62; edematous with significant scrotal edema. F/U BNP.  4.  CAD/ s/p CABG in [place], [place]. Will need to obtain data.  Does not appear to be an acute MI/STEMI.  HS Troponin is low at 89.  Depending on neurologic function and future PVD surgery, ideally will need future ischemic evaluation, but with only one kidney and Cr increasing would not give contrast  5. PAF/ occasional to frequent PVCs: Amiodarone was dc'd by EP. With PVCs ; Amiodarone started orally 7/29 at 400 mg bid for 5 days;  Ectopy resolved;Marland Kitchen Mg repleted; 2.1 7/30. Will f/u LFTs in am on amiodarone.  5. PVD: prior R AKA, now with ischemic cold R foot with non-retrievable capillary bed, felt to be non-salvageable; will need R AKA. Risk is high but agree necessary due to ongoing risk of ischemic leg. Plan for surgery today with Dr. Scot Dock. Heparin to be dc at 10 am per Dr. Scot Dock.  6. AKI: Previous left nephrectomy with baseline Stage 3 CKD.  Cr (1.55 on 7/25) increased  to 2.76 7/30 >> 2.09 today.  7. Respiratory Failure: intubated followed by CCM; ETT tube was repositioned.   8. Aspiration pneumonia: Flagyl added  to Rocephin for improved anaerobic coverage   9. F/U lipid panel once off propofol   CC: 35 minutes  Signed, Troy Sine, MD, [facility] 03/07/2019, 8:58 AM

## 2019-04-05 NOTE — Progress Notes (Signed)
ANTICOAGULATION CONSULT NOTE - [place] for heparin Indication: chest pain/ACS  Allergies  Allergen Reactions  . Erythromycin Anaphylaxis and Rash  . Iohexol Anaphylaxis, Hives, Itching and Other (See Comments)    Note:  13 HR PRE-MED GIVEN AND SUCCESSFUL-ARS 07/15/07.     Marland Kitchen Penicillins Anaphylaxis, Itching and Other (See Comments)    Has patient had a PCN reaction causing immediate rash, facial/tongue/throat swelling, SOB or lightheadedness with hypotension: Yes Has patient had a PCN reaction causing severe rash involving mucus membranes or skin necrosis: No Has patient had a PCN reaction that required hospitalization No Has patient had a PCN reaction occurring within the last 10 years: No If all of the above answers are "NO", then may proceed with Cephalosporin use.    Patient Measurements: Height: 6' (182.9 cm) Weight: 233 lb 0.4 oz (105.7 kg) IBW/kg (Calculated) : 77.6 Heparin Dosing Weight: 97.3 kg  Vital Signs: Temp: 99.3 F (37.4 C) (07/31 0744) Temp Source: Bladder (07/31 0400) BP: 121/62 (07/31 0744) Pulse Rate: 82 (07/31 0744)  Labs: Recent Labs    04/03/19 0358  04/03/19 2306  04/04/19 0209 04/04/19 1034 04/04/19 1126 04/04/19 2211 03/27/2019 0448 03/16/2019 0649  HGB 11.8*   < > 13.5   < > 13.2 13.6  --   --  12.1*  --   HCT 36.6*   < > 42.3   < > 40.8 40.0  --   --  36.8*  --   PLT 213  --  201  --  193  --   --   --  174  --   LABPROT 15.8*  --   --   --  17.4*  --   --   --  20.0*  --   INR 1.3*  --   --   --  1.5*  --   --   --  1.7*  --   HEPARINUNFRC 0.33  --   --   --  0.23*  --  0.23* 0.27*  --  0.33  CREATININE 2.47*  --  2.70*  --  2.76*  --   --   --  2.09*  --   TROPONINIHS  --   --  128*  --   --   --   --   --   --   --    < > = values in this interval not displayed.    Estimated Creatinine Clearance: 37.8 mL/min (A) (by C-G formula based on SCr of 2.09 mg/dL (H)).   Medical History: Past Medical History:   Diagnosis Date  . Arthritis    "qwhere" (07/09/2013)  . Asthma   . COPD (chronic obstructive pulmonary disease) ([place])    pt denies this hx on 07/09/2013; has chronic dyspnea  . Coronary artery disease    Reportedly multivessel disease-CABG  . Gout   . H/O hiatal hernia 1997  . H/O: rheumatic fever    "had rheumatic fever as a kid" (07/09/2013) -has murmur  . Hyperlipidemia with target LDL less than 70   . Hypertension   . Ischemic cardiomyopathy    By report,-ICD  . Myocardial infarction ([place]) 03/2002; ~ 2011  . Neuropathy   . PAD (peripheral artery disease) ([place]) 2014   Right SFA stent November 2014; status post left AKA  . Pneumonia    "once" (07/09/2013)  . Renal disorder    "only have 1 kidney; due to go back soon to check the other one" (  07/09/2013)  . RUPTURE ROTATOR CUFF 12/03/2009   Qualifier: Diagnosis of  By: Aline Brochure MD, Dorothyann Peng    . Type II diabetes mellitus ([place])   . Ureter cancer ([place]) 12/2001   "shut down my kidney & resulted in nephrectomy" (07/09/2013)  . UTI (urinary tract infection) 10/04/2018    Assessment: 78 yo male with VT and concern for ACS (hx MI and CABG) also with ischemic right foot that will require R AKA. Pt will be going to the OR today (07/31) for AKA.   Plan: - Heparin will be stopped at 1000 due to plans for patient to go to the OR for AKA. - F/U anticoagulation plans once patient returns from surgery and is stable.  Sherren Kerns, PharmD PGY1 Acute Care Pharmacy Resident [phone] 03/10/2019 9:18 AM

## 2019-04-05 NOTE — Anesthesia Postprocedure Evaluation (Signed)
Anesthesia Post Note  Patient: [name]  Procedure(s) Performed: AMPUTATION ABOVE KNEE (Right )     Patient location during evaluation: SICU Anesthesia Type: General Level of consciousness: sedated Pain management: pain level controlled Vital Signs Assessment: post-procedure vital signs reviewed and stable Respiratory status: patient remains intubated per anesthesia plan Cardiovascular status: stable Postop Assessment: no apparent nausea or vomiting Anesthetic complications: no    Last Vitals:  Vitals:   03/14/2019 1031 03/20/2019 1111  BP: 117/62   Pulse: 73   Resp:    Temp: 37.2 C   SpO2: 100% 100%    Last Pain:  Vitals:   03/06/2019 1031  TempSrc: Bladder  PainSc:                  [name]

## 2019-04-05 NOTE — Progress Notes (Signed)
NAME:  [name], MRN:  [id number], DOB:  [birth year], LOS: 6 ADMISSION DATE:  03/07/2019, CONSULTATION DATE:  03/27/2019 REFERRING MD:  Forestine Na EM (Dr. Sabra Heck), CHIEF COMPLAINT:  S/p VT and 1x cardioversion  Brief History   78 yo M s/p VT arrest s/p cardioversion x 1 by EMS, unclear if patient was pulseless or had pulse. Intubated at [facility] and transferred to [facility]  SARS-CoV2 Negative 03/25/2019  Past Medical History  COPD, DM2, CAD, MI, s/p CABG , Systolic heart failure ICM HTN, HLD, EtOH abuse, FTT, PVD , Ureter Cancer s/p L nephrectomy  CKD III   Significant Hospital Events   7/25 s/p VT arrest with ROSC after 1x cardioversion 120J. Intubated at [facility], CVC placed. Transferring to [facility] for ICU admission  7/26: Seen by vascular surgery for ischemic right limb felt that the extremity was not salvageable and recommended amputation. 7/28: Having episodes of desaturation during weaning efforts.  Vasoactive drips weaned off.  Being treated for possible infiltrate in right lower lobe. 7/29: Desaturated down to the 60s requiring bag valve mask ventilation, associated hypotension requiring escalation of vasoactive drips.  Arterial blood gas obtained showing pH of 7.16, PCO2 of 58, saturations 155 and bicarbonate 22, bicarbonate drip initiated 7/30: Vasopressin discontinued, now on just norepinephrine at decreasing dose.  Still having trouble weaning pressors.  Did get 1 dose of Lasix overnight.  Seen by vascular surgery planning for above-the-knee amputation   Consults:  Vascular surgery  Cardiology   Procedures:  7/25 ETT>> 7/25 CVC >>   Significant Diagnostic Tests:  CXR 7/25> ETT and CVC in adequate position. No pneumothorax. Cardiomegaly. Bilateral pulmonary infiltrates vs edema   CT Head 7/25 > NAICA CT Abdomen/Pelvis 7/25 - Bilateral pleural effusions, bibasilar airspace opacities, cardiomegaly, anasarca, absent left kidney  Micro Data:  7/25 SARS CoV2 > negative   7/26 BCX > 7/26 RCX > Normal flora  Antimicrobials:  7/25 Rocephin>> 04/06/2019     Interim history/subjective:    03/24/2019 - slight improvement in renal function. 80% fio2, peep 12.levophed 7. ,diprivan gtt an fent gtt. More awake. R AKA later today   Objective   Blood pressure 121/62, pulse 82, temperature 99.3 F (37.4 C), resp. rate 19, height 6' (1.829 m), weight 105.7 kg, SpO2 100 %. CVP:  [8 mmHg-12 mmHg] 8 mmHg  Vent Mode: PRVC FiO2 (%):  [80 %-100 %] 80 % Set Rate:  [20 bmp] 20 bmp Vt Set:  [510 mL] 510 mL PEEP:  [10 YDX41-28 cmH20] 10 cmH20 Plateau Pressure:  [19 cmH20-22 cmH20] 19 cmH20   Intake/Output Summary (Last 24 hours) at 03/06/2019 0958 Last data filed at 03/13/2019 0600 Gross per 24 hour  Intake 2590.18 ml  Output 1000 ml  Net 1590.18 ml   Filed Weights   04/03/19 0030 04/03/19 0441 04/04/19 0319  Weight: 97.2 kg 97.2 kg 105.7 kg   General Appearance:  Looks criticall ill  Head:  Normocephalic, without obvious abnormality, atraumatic Eyes:  PERRL - equal, conjunctiva/corneas - muddy     Ears:  Normal external ear canals, both ears Nose:  G tube - no Throat:  ETT TUBE - yes , OG tube - yes Neck:  Supple,  No enlargement/tenderness/nodules Lungs: Clear to auscultation bilaterally, Ventilator   Synchrony - yes Heart:  S1 and S2 normal, no murmur, CVP - x.  Pressors - levophed + Abdomen:  Soft, no masses, no organomegaly Genitalia / Rectal:  Not done Extremities:  Extremities- RLE cold  knee down. Left amputee lower extremity Skin:  ntact in exposed areas . Sacral area - not xamined Neurologic:  Sedation - diprivan gtt and fent gtt -> RASS - -3     LABS    PULMONARY Recent Labs  Lab 03/15/2019 2338 03/31/19 0343 04/03/19 1145 04/03/19 2217 04/03/19 2316 04/04/19 0050 04/04/19 1034  PHART 7.383 7.443  --  7.157* 7.223*  --  7.327*  PCO2ART 38.5 32.7  --  59.0* 52.4*  --  42.5  PO2ART 81.0* 71.0*  --  155.0* 170.0*  --  46.0*  HCO3 23.0  22.6  --  20.7 21.5  --  22.3  TCO2 24 24  --  22 23  --  24  O2SAT 96.0 96.0 95.6 99.0 99.0 77.8 78.0    CBC Recent Labs  Lab 04/03/19 2306  04/04/19 0209 04/04/19 1034 03/26/2019 0448  HGB 13.5   < > 13.2 13.6 12.1*  HCT 42.3   < > 40.8 40.0 36.8*  WBC 8.3  --  9.4  --  7.1  PLT 201  --  193  --  174   < > = values in this interval not displayed.    COAGULATION Recent Labs  Lab 04/01/19 0400 04/02/19 0413 04/03/19 0358 04/04/19 0209 03/22/2019 0448  INR 1.4* 1.3* 1.3* 1.5* 1.7*    CARDIAC  No results for input(s): TROPONINI in the last 168 hours. No results for input(s): PROBNP in the last 168 hours.   CHEMISTRY Recent Labs  Lab 03/31/19 0110 03/31/19 0224  04/01/19 0400 04/02/19 0413 04/02/19 1610 04/03/19 0358 04/03/19 1700  04/03/19 2306 04/03/19 2316 04/04/19 0209 04/04/19 1034 03/26/2019 0448  NA 136 136   < > 136 137  --  137  --    < > 139 140 139 141 143  K 4.5 4.7   < > 4.5 4.0  --  4.3  --    < > 4.2 4.1 3.8 3.7 3.5  CL 102 102   < > 103 104  --  107  --   --  104  --  106  --  106  CO2 23 21*   < > 23 20*  --  20*  --   --  20*  --  20*  --  27  GLUCOSE 145* 128*   < > 90 95  --  150*  --   --  142*  --  177*  --  197*  BUN 30* 29*   < > 36* 44*  --  50*  --   --  63*  --  64*  --  60*  CREATININE 1.77* 1.80*   < > 2.42* 2.48*  --  2.47*  --   --  2.70*  --  2.76*  --  2.09*  CALCIUM 8.4* 8.7*   < > 8.0* 7.8*  --  7.9*  --   --  7.9*  --  8.1*  --  7.7*  MG 2.1 2.1  --  2.0 1.8  --   --   --   --   --   --  2.1  --   --   PHOS 4.1 3.9  --   --   --  5.0* 5.0* 6.0*  --   --   --   --   --   --    < > = values in this interval not displayed.   Estimated Creatinine Clearance: 37.8 mL/min (A) (by  C-G formula based on SCr of 2.09 mg/dL (H)).   LIVER Recent Labs  Lab 03/16/2019 1838  03/31/19 0110 04/01/19 0400 04/02/19 0413 04/03/19 0358 04/03/19 2306 04/04/19 0209 03/27/2019 0448  AST 24  --  19  --   --   --  948* 825*  --   ALT 19  --  17   --   --   --  448* 439*  --   ALKPHOS 57  --  59  --   --   --  214* 195*  --   BILITOT 0.8  --  0.6  --   --   --  0.3 0.6  --   PROT 5.7*  --  5.4*  --   --   --  5.5* 5.3*  --   ALBUMIN 2.9*  --  2.8*  --   --   --  2.3* 2.3*  --   INR  --    < > 1.4* 1.4* 1.3* 1.3*  --  1.5* 1.7*   < > = values in this interval not displayed.     INFECTIOUS Recent Labs  Lab 04/03/19 2306 04/04/19 0209  LATICACIDVEN 2.2* 1.6     ENDOCRINE CBG (last 3)  Recent Labs    04/04/19 2339 03/28/2019 0456 03/27/2019 0758  GLUCAP 190* 179* 144*         IMAGING x48h  - image(s) personally visualized  -   highlighted in bold Dg Abd 1 View  Result Date: 04/03/2019 CLINICAL DATA:  Hypoxia EXAM: ABDOMEN - 1 VIEW COMPARISON:  06/03/2017 FINDINGS: Three images of the abdomen are submitted. Cardiomegaly and basilar airspace disease. Final image with time stamp at 10:39 p.m. demonstrates an esophageal tube with the side port in the region of the GE junction. Visible upper gas pattern is nonobstructed. IMPRESSION: Esophageal tube tip overlies proximal stomach, side-port in the region of GE junction. Suggest further advancement for more optimal positioning Electronically Signed   By: Donavan Foil M.D.   On: 04/03/2019 23:07   Dg Chest Port 1 View  Result Date: 03/13/2019 CLINICAL DATA:  Intubation. EXAM: PORTABLE CHEST 1 VIEW COMPARISON:  04/03/2019. FINDINGS: NG tube noted with tip over the stomach. Endotracheal tube and right IJ line stable position. Prior CABG. Cardiomegaly with diffuse bilateral pulmonary interstitial prominence and small bilateral pleural effusions. Dense left lung base atelectasis/consolidation. No pneumothorax. IMPRESSION: 1. NG tube noted with tip over the stomach. Endotracheal tube and right IJ line stable position. 2. Prior CABG. Cardiomegaly with pulmonary venous congestion and diffuse bilateral pulmonary interstitial prominence. Bilateral pleural effusions. Findings suggest CHF. 3.   Dense left lung base atelectasis/consolidation. Electronically Signed   By: Marcello Moores  Register   On: 03/27/2019 06:31   Dg Chest Port 1 View  Result Date: 04/03/2019 CLINICAL DATA:  Hypoxia EXAM: PORTABLE CHEST 1 VIEW COMPARISON:  04/03/2019, 04/01/2019 FINDINGS: Endotracheal tube tip is about 5.8 cm superior to the carina. Esophageal tube has been removed. Right IJ central venous catheter tip over the distal jugular region. Post sternotomy changes. Small bilateral pleural effusions. Slightly improved aeration at the right base. Persistent consolidation left lung base. Cardiomegaly with vascular congestion and mild diffuse interstitial opacities suspicious for edema. Aortic atherosclerosis. No pneumothorax. IMPRESSION: 1. Endotracheal tube tip about 5.8 cm superior from the carina. Esophageal tube has been removed 2. Slightly improved aeration of right lung base. 3. Cardiomegaly with vascular congestion and mild interstitial edema. Residual small pleural effusions and persistent dense airspace disease  at the left base. Mild hazy edema or infiltrate at the right base. Electronically Signed   By: Donavan Foil M.D.   On: 04/03/2019 23:05   Dg Abd Portable 1v  Result Date: 04/04/2019 CLINICAL DATA:  OG tube placement EXAM: PORTABLE ABDOMEN - 1 VIEW COMPARISON:  04/03/2019 FINDINGS: Enteric tube terminates in the mid gastric body. IMPRESSION: Enteric tube terminates in the mid gastric body. Electronically Signed   By: Julian Hy M.D.   On: 04/04/2019 01:53     Resolved Hospital Problem list     Assessment & Plan:   ASSESSMENT / PLAN:   A:  Acute resp failuire due to cardiac arrest and multiple issues  03/18/2019 -> does not meet SBT criteria due to severe hypxoemia, shock and needs for AKA  P:   Full vent support VAP protocol    A:   On cymbalta, seroquie, halcion,  and neurontin at home Acute encephalopathy - post arrest +  03/19/2019 - ? Slowly better   P:   Diprivan gtt -  check CK and lactic Monitor    A:   Criculatory shock - some of this is diprivan mediated possibly and also cardiogenic Also, PVD with RLE acute ischemia   P:  Levophed  MAP goal > 65% AKA 04/02/2019 in OR - patient critically ill and high risk for complication  - explained to wife Hold heparin gtt  for OR   A: combined acute on chronic systolic and diastolic heart failure - baseline ef 45% coronary artery disease, and hypertension.   Post arrest ef - 25%   P: Per cardiology  A: S/p VT arrest with cardioversion, presumed pulseless VT,  7.31 - on oral amio for A fib  P: Per cardioogy Await EKG 04/06/19   A:   No evidence of infection P:   [place]   A:  CKD III - baseline creat 1.3 (Hx Ureter cancer s/p L nephrectomy andL abdominal protuberance  AKI at admit post arrest  03/26/2019 - improving AKI. On bic gt for metabolic aciodis  P:  Monitor Continue bic gtt for now - reassess 04/06/19 post op based on lacttate and abg     A:  Anemia critical illness   P:  - PRBC for hgb </= 6.9gm%    - exceptions are   -  if ACS susepcted/confirmed then transfuse for hgb </= 8.0gm%,  or    -  active bleeding with hemodynamic instability, then transfuse regardless of hemoglobin value   At at all times try to transfuse 1 unit prbc as possible with exception of active hemorrhage      A:   At risk hyperglyceia  - DM2 P:   ssi  Mid line sacral wound P mid    Best practice:  Diet: NPO  Pain/Anxiety/Delirium protocol (if indicated): Fent, Precedex VAP protocol (if indicated): yes  DVT prophylaxis: SCD  GI prophylaxis: Pepcid Glucose control: SSI Mobility: BR  Code Status: Full   Family Communication: updated wife.   Disposition: ICU   ATTESTATION & SIGNATURE   The patient [name] is critically ill with multiple organ systems failure and requires high complexity decision making for assessment and support, frequent evaluation and titration  of therapies, application of advanced monitoring technologies and extensive interpretation of multiple databases.   Critical Care Time devoted to patient care services described in this note is  30  Minutes. This time reflects time of care of this signee Dr Brand Males. This critical care  time does not reflect procedure time, or teaching time or supervisory time of PA/NP/Med student/Med Resident etc but could involve care discussion time     Dr. Brand Males, M.D., [facility].C.P Pulmonary and Critical Care Medicine Staff Physician [facility] Pager: [phone], If no answer or between  15:00h - 7:00h: call [phone]  03/25/2019 9:59 AM

## 2019-04-05 NOTE — Anesthesia Preprocedure Evaluation (Signed)
Anesthesia Evaluation  Patient identified by MRN, date of birth, ID band Patient unresponsive    Reviewed: Allergy & Precautions, NPO status , Patient's Chart, lab work & pertinent test results, Unable to perform ROS - Chart review only  Airway Mallampati: Intubated       Dental   Pulmonary asthma , COPD, Current Smoker,       + intubated    Cardiovascular hypertension, + angina with exertion + CAD, + Past MI, + CABG, + Peripheral Vascular Disease (right leg gangrenous tissue) and +CHF   Rhythm:Regular Rate:Normal  Levophed infusion   Neuro/Psych PSYCHIATRIC DISORDERS Dementia negative neurological ROS     GI/Hepatic Neg liver ROS, hiatal hernia,   Endo/Other  diabetes, Type 2, Oral Hypoglycemic Agents  Renal/GU Renal InsufficiencyRenal diseaseS/p nephrectomy      Musculoskeletal  (+) Arthritis ,   Abdominal   Peds  Hematology  (+) Blood dyscrasia, anemia ,   Anesthesia Other Findings Day of surgery medications reviewed with the patient.  Reproductive/Obstetrics                             Anesthesia Physical Anesthesia Plan  ASA: IV  Anesthesia Plan: General   Post-op Pain Management:    Induction: Inhalational  PONV Risk Score and Plan: 1 and Treatment may vary due to age or medical condition  Airway Management Planned: Oral ETT  Additional Equipment: CVP and Arterial line  Intra-op Plan:   Post-operative Plan: Post-operative intubation/ventilation  Informed Consent: I have reviewed the patients History and Physical, chart, labs and discussed the procedure including the risks, benefits and alternatives for the proposed anesthesia with the patient or authorized representative who has indicated his/her understanding and acceptance.     Dental advisory given and History available from chart only  Plan Discussed with: CRNA  Anesthesia Plan Comments:         Anesthesia  Quick Evaluation

## 2019-04-05 NOTE — Interval H&P Note (Signed)
History and Physical Interval Note:  03/22/2019 12:00 PM  [name]  has presented today for surgery, with the diagnosis of right leg gangrenous tissue.  The various methods of treatment have been discussed with the patient and family. After consideration of risks, benefits and other options for treatment, the patient has consented to  Procedure(s): AMPUTATION ABOVE KNEE (Right) as a surgical intervention.  The patient's history has been reviewed, patient examined, no change in status, stable for surgery.  I have reviewed the patient's chart and labs.  Questions were answered to the patient's satisfaction.     [name]

## 2019-04-05 NOTE — Op Note (Signed)
    NAME: [name]    MRN: [id number] DOB: [birth year]    DATE OF OPERATION: 04/02/2019  PREOP DIAGNOSIS:    Ischemic right lower extremity  POSTOP DIAGNOSIS:    Same  PROCEDURE:    Right above-the-knee amputation  SURGEON: Judeth Cornfield. Scot Dock, MD, FACS  ASSIST: Ellsworth Lennox, RNFA  ANESTHESIA: General  EBL: Minimal  INDICATIONS:    [name] is a 78 y.o. male who had a cardiac arrest.  He has been critically ill and developed an ischemic right lower extremity.  This was not salvageable.  He presents for right above-the-knee amputation.  FINDINGS:   He had significant edema.  He appeared to have adequate perfusion at the level of the amputation.  TECHNIQUE:   The patient was taken to the operating room and received a general anesthetic.  The right lower extremity was prepped and draped in usual sterile fashion.  A tourniquet was placed on the upper thigh.  A fishmouth incision was marked above the level of the patella.  The leg was exsanguinated.  The tourniquet was inflated to 300 mmHg.  Under tourniquet control the incision was carried down to the skin and subcutaneous tissue to the muscle.  Because of his severe calcific disease the tourniquet was not fully effective.  Therefore I did the remainder of the dissection with electrocautery.  The dissection was carried down to the femur.  The femoral arteries and veins were individually divided between clamps and suture ligated.  The bone was divided proximal to the level of skin division.  The leg was removed.  Additional hemostasis was obtained using 2-0 silk ties and electrocautery.  The wound was then irrigated with copious amounts of saline and the edges of the bone were rasped.  The fascial layer was closed with interrupted 2-0 Vicryl's.  The skin was closed with staples.  A sterile dressing was applied.  The patient tolerated the procedure well was transferred to the recovery room in stable condition.  All needle and  sponge counts were correct.  [name], MD, FACS [facility]  DATE OF DICTATION:   04/02/2019

## 2019-04-05 NOTE — Transfer of Care (Signed)
Immediate Anesthesia Transfer of Care Note  Patient: [name]  Procedure(s) Performed: AMPUTATION ABOVE KNEE (Right )  Patient Location: SICU  Anesthesia Type:General  Level of Consciousness: sedated and Patient remains intubated per anesthesia plan  Airway & Oxygen Therapy: Patient placed on Ventilator (see vital sign flow sheet for setting)  Post-op Assessment: Post -op Vital signs reviewed and stable  Post vital signs: stable  Last Vitals:  Vitals Value Taken Time  BP    Temp    Pulse    Resp    SpO2      Last Pain:  Vitals:   03/25/2019 1031  TempSrc: Bladder  PainSc:          Complications: No apparent anesthesia complications

## 2019-04-06 ENCOUNTER — Inpatient Hospital Stay (HOSPITAL_COMMUNITY): Payer: Medicare HMO

## 2019-04-06 LAB — CBC
HCT: 33.7 % — ABNORMAL LOW (ref 39.0–52.0)
Hemoglobin: 10.9 g/dL — ABNORMAL LOW (ref 13.0–17.0)
MCH: 29 pg (ref 26.0–34.0)
MCHC: 32.3 g/dL (ref 30.0–36.0)
MCV: 89.6 fL (ref 80.0–100.0)
Platelets: 157 10*3/uL (ref 150–400)
RBC: 3.76 MIL/uL — ABNORMAL LOW (ref 4.22–5.81)
RDW: 17 % — ABNORMAL HIGH (ref 11.5–15.5)
WBC: 6.1 10*3/uL (ref 4.0–10.5)
nRBC: 0 % (ref 0.0–0.2)

## 2019-04-06 LAB — CK TOTAL AND CKMB (NOT AT ARMC)
CK, MB: 2.7 ng/mL (ref 0.5–5.0)
Relative Index: INVALID (ref 0.0–2.5)
Total CK: 91 U/L (ref 49–397)

## 2019-04-06 LAB — GLUCOSE, CAPILLARY
Glucose-Capillary: 155 mg/dL — ABNORMAL HIGH (ref 70–99)
Glucose-Capillary: 195 mg/dL — ABNORMAL HIGH (ref 70–99)
Glucose-Capillary: 199 mg/dL — ABNORMAL HIGH (ref 70–99)
Glucose-Capillary: 179 mg/dL — ABNORMAL HIGH (ref 70–99)
Glucose-Capillary: 147 mg/dL — ABNORMAL HIGH (ref 70–99)
Glucose-Capillary: 160 mg/dL — ABNORMAL HIGH (ref 70–99)

## 2019-04-06 LAB — BLOOD GAS, ARTERIAL
Acid-Base Excess: 6 mmol/L — ABNORMAL HIGH (ref 0.0–2.0)
Bicarbonate: 30.5 mmol/L — ABNORMAL HIGH (ref 20.0–28.0)
FIO2: 60
MECHVT: 510 mL
O2 Saturation: 97.3 %
PEEP: 8 cmH2O
Patient temperature: 98.6
RATE: 20 resp/min
pCO2 arterial: 48.6 mmHg — ABNORMAL HIGH (ref 32.0–48.0)
pH, Arterial: 7.414 (ref 7.350–7.450)
pO2, Arterial: 94.5 mmHg (ref 83.0–108.0)

## 2019-04-06 LAB — BASIC METABOLIC PANEL
Anion gap: 9 (ref 5–15)
BUN: 50 mg/dL — ABNORMAL HIGH (ref 8–23)
CO2: 30 mmol/L (ref 22–32)
Calcium: 7.5 mg/dL — ABNORMAL LOW (ref 8.9–10.3)
Chloride: 103 mmol/L (ref 98–111)
Creatinine, Ser: 1.59 mg/dL — ABNORMAL HIGH (ref 0.61–1.24)
GFR calc Af Amer: 48 mL/min — ABNORMAL LOW (ref >60)
GFR calc non Af Amer: 42 mL/min — ABNORMAL LOW (ref >60)
Glucose, Bld: 202 mg/dL — ABNORMAL HIGH (ref 70–99)
Potassium: 3.8 mmol/L (ref 3.5–5.1)
Sodium: 142 mmol/L (ref 135–145)

## 2019-04-06 LAB — HEPATIC FUNCTION PANEL
ALT: 119 U/L — ABNORMAL HIGH (ref 0–44)
AST: 49 U/L — ABNORMAL HIGH (ref 15–41)
Albumin: 1.6 g/dL — ABNORMAL LOW (ref 3.5–5.0)
Alkaline Phosphatase: 87 U/L (ref 38–126)
Bilirubin, Direct: 0.1 mg/dL (ref 0.0–0.2)
Indirect Bilirubin: 0.2 mg/dL — ABNORMAL LOW (ref 0.3–0.9)
Total Bilirubin: 0.3 mg/dL (ref 0.3–1.2)
Total Protein: 4.4 g/dL — ABNORMAL LOW (ref 6.5–8.1)

## 2019-04-06 LAB — MAGNESIUM: Magnesium: 1.6 mg/dL — ABNORMAL LOW (ref 1.7–2.4)

## 2019-04-06 LAB — LACTIC ACID, PLASMA: Lactic Acid, Venous: 1.7 mmol/L (ref 0.5–1.9)

## 2019-04-06 LAB — HEPARIN LEVEL (UNFRACTIONATED): Heparin Unfractionated: 0.32 IU/mL (ref 0.30–0.70)

## 2019-04-06 LAB — PROTIME-INR
INR: 1.7 — ABNORMAL HIGH (ref 0.8–1.2)
Prothrombin Time: 19.3 seconds — ABNORMAL HIGH (ref 11.4–15.2)

## 2019-04-06 LAB — PHOSPHORUS: Phosphorus: 2.5 mg/dL (ref 2.5–4.6)

## 2019-04-06 MED ORDER — MAGNESIUM SULFATE 4 GM/100ML IV SOLN
4.0000 g | Freq: Once | INTRAVENOUS | Status: AC
  Administered 2019-04-06: 09:00:00 4 g via INTRAVENOUS
  Filled 2019-04-06: qty 100

## 2019-04-06 MED ORDER — CHLORHEXIDINE GLUCONATE 0.12 % MT SOLN
OROMUCOSAL | Status: AC
  Filled 2019-04-06: qty 15

## 2019-04-06 NOTE — Progress Notes (Addendum)
NAME:  [name], MRN:  [id number], DOB:  [birth year], LOS: 7 ADMISSION DATE:  03/20/2019, CONSULTATION DATE:  03/29/2019 REFERRING MD:  Forestine Na EM (Dr. Sabra Heck), CHIEF COMPLAINT:  S/p VT and 1x cardioversion  Brief History   78 yo M s/p VT arrest s/p cardioversion x 1 by EMS, unclear if patient was pulseless or had pulse. Intubated at [facility] and transferred to [facility]  SARS-CoV2 Negative 04/01/2019  Past Medical History  COPD, DM2, CAD, MI, s/p CABG , Systolic heart failure ICM HTN, HLD, EtOH abuse, FTT, PVD , Ureter Cancer s/p L nephrectomy  CKD III   Significant Hospital Events   7/25 s/p VT arrest with ROSC after 1x cardioversion 120J. Intubated at [facility], CVC placed. Transferring to [facility] for ICU admission   7/26: Seen by vascular surgery for ischemic right limb felt that the extremity was not salvageable and recommended amputation.  7/28: Having episodes of desaturation during weaning efforts.  Vasoactive drips weaned off.  Being treated for possible infiltrate in right lower lobe.  7/29: Desaturated down to the 60s requiring bag valve mask ventilation, associated hypotension requiring escalation of vasoactive drips.  Arterial blood gas obtained showing pH of 7.16, PCO2 of 58, saturations 155 and bicarbonate 22, bicarbonate drip initiated  7/30: Vasopressin discontinued, now on just norepinephrine at decreasing dose.  Still having trouble weaning pressors.  Did get 1 dose of Lasix overnight.  Seen by vascular surgery planning for above-the-knee amputation  7/31  - slight improvement in renal function. 80% fio2, peep 12.levophed 7. ,diprivan gtt an fent gtt. More awake. R AKA later today Consults:  Vascular surgery  Cardiology   Procedures:  7/25 ETT>> 7/25 CVC >>   Significant Diagnostic Tests:  CXR 7/25> ETT and CVC in adequate position. No pneumothorax. Cardiomegaly. Bilateral pulmonary infiltrates vs edema   CT Head 7/25 > NAICA CT Abdomen/Pelvis 7/25 -  Bilateral pleural effusions, bibasilar airspace opacities, cardiomegaly, anasarca, absent left kidney  Micro Data:  7/25 SARS CoV2 > negative  7/26 BCX > 7/26 RCX > Normal flora  Antimicrobials:  7/25 Rocephin>> 04/06/2019   Interim history/subjective:    04/06/2019 - s/p R AKA yesterday due to ischemic right lower extremity. Back on heparin gtt. Renal function better. CK/Lactate normal on diprivan gtt. Also on fentt gtt, levophed gtt, bic gtt. On TF. Vent at 60%, peep 8. LFT better   Objective   Blood pressure 113/74, pulse (!) 108, temperature 99.7 F (37.6 C), resp. rate 20, height 6' (1.829 m), weight 100.3 kg, SpO2 95 %. CVP:  [10 mmHg-33 mmHg] 20 mmHg  Vent Mode: PRVC FiO2 (%):  [60 %-80 %] 60 % Set Rate:  [20 bmp] 20 bmp Vt Set:  [510 mL] 510 mL PEEP:  [8 cmH20-10 cmH20] 8 cmH20 Plateau Pressure:  [14 cmH20-18 cmH20] 14 cmH20   Intake/Output Summary (Last 24 hours) at 04/06/2019 8676 Last data filed at 04/06/2019 0800 Gross per 24 hour  Intake 5790.2 ml  Output 2035 ml  Net 3755.2 ml   Filed Weights   04/03/19 0441 04/04/19 0319 04/06/19 0335  Weight: 97.2 kg 105.7 kg 100.3 kg     General Appearance:  Looks criticall il Head:  Normocephalic, without obvious abnormality, atraumatic Eyes:  PERRL - yes, conjunctiva/corneas - muddy     Ears:  Normal external ear canals, both ears Nose:  G tube - no Throat:  ETT TUBE - yes , OG tube - yes Neck:  Supple,  No enlargement/tenderness/nodules  Lungs: Clear to auscultation bilaterally, Ventilator   Synchrony - yes 60% fio2, peep 8 Heart:  S1 and S2 normal, no murmur, CVP - x.  Pressors - levophed Abdomen:  Soft, no masses, no organomegaly Genitalia / Rectal:  Not done Extremities:  Extremities- Bilateral AKA Skin:  ntact in exposed areas . Sacral area - not examined Neurologic:  Sedation - diprivan gtt/fent gtt -> RASS - 4       LABS    PULMONARY Recent Labs  Lab 04/04/2019 2338 03/31/19 0343 04/03/19 1145  04/03/19 2217 04/03/19 2316 04/04/19 0050 04/04/19 1034  PHART 7.383 7.443  --  7.157* 7.223*  --  7.327*  PCO2ART 38.5 32.7  --  59.0* 52.4*  --  42.5  PO2ART 81.0* 71.0*  --  155.0* 170.0*  --  46.0*  HCO3 23.0 22.6  --  20.7 21.5  --  22.3  TCO2 24 24  --  22 23  --  24  O2SAT 96.0 96.0 95.6 99.0 99.0 77.8 78.0    CBC Recent Labs  Lab 04/04/19 0209 04/04/19 1034 03/25/2019 0448 04/06/19 0332  HGB 13.2 13.6 12.1* 10.9*  HCT 40.8 40.0 36.8* 33.7*  WBC 9.4  --  7.1 6.1  PLT 193  --  174 157    COAGULATION Recent Labs  Lab 04/02/19 0413 04/03/19 0358 04/04/19 0209 04/01/2019 0448 04/06/19 0332  INR 1.3* 1.3* 1.5* 1.7* 1.7*    CARDIAC  No results for input(s): TROPONINI in the last 168 hours. No results for input(s): PROBNP in the last 168 hours.   CHEMISTRY Recent Labs  Lab 03/31/19 0224  04/01/19 0400 04/02/19 0413 04/02/19 1610 04/03/19 0358 04/03/19 1700  04/03/19 2306 04/03/19 2316 04/04/19 0209 04/04/19 1034 03/10/2019 0448 04/06/19 0332  NA 136   < > 136 137  --  137  --    < > 139 140 139 141 143 142  K 4.7   < > 4.5 4.0  --  4.3  --    < > 4.2 4.1 3.8 3.7 3.5 3.8  CL 102   < > 103 104  --  107  --   --  104  --  106  --  106 103  CO2 21*   < > 23 20*  --  20*  --   --  20*  --  20*  --  27 30  GLUCOSE 128*   < > 90 95  --  150*  --   --  142*  --  177*  --  197* 202*  BUN 29*   < > 36* 44*  --  50*  --   --  63*  --  64*  --  60* 50*  CREATININE 1.80*   < > 2.42* 2.48*  --  2.47*  --   --  2.70*  --  2.76*  --  2.09* 1.59*  CALCIUM 8.7*   < > 8.0* 7.8*  --  7.9*  --   --  7.9*  --  8.1*  --  7.7* 7.5*  MG 2.1  --  2.0 1.8  --   --   --   --   --   --  2.1  --   --  1.6*  PHOS 3.9  --   --   --  5.0* 5.0* 6.0*  --   --   --   --   --   --  2.5   < > =  values in this interval not displayed.   Estimated Creatinine Clearance: 48.5 mL/min (A) (by C-G formula based on SCr of 1.59 mg/dL (H)).   LIVER Recent Labs  Lab 04/03/2019 1838 03/31/19 0110   04/02/19 0413 04/03/19 0358 04/03/19 2306 04/04/19 0209 03/23/2019 0448 04/06/19 0332  AST 24 19  --   --   --  948* 825*  --  49*  ALT 19 17  --   --   --  448* 439*  --  119*  ALKPHOS 57 59  --   --   --  214* 195*  --  87  BILITOT 0.8 0.6  --   --   --  0.3 0.6  --  0.3  PROT 5.7* 5.4*  --   --   --  5.5* 5.3*  --  4.4*  ALBUMIN 2.9* 2.8*  --   --   --  2.3* 2.3*  --  1.6*  INR  --  1.4*   < > 1.3* 1.3*  --  1.5* 1.7* 1.7*   < > = values in this interval not displayed.     INFECTIOUS Recent Labs  Lab 04/03/19 2306 04/04/19 0209 04/06/19 0332  LATICACIDVEN 2.2* 1.6 1.7     ENDOCRINE CBG (last 3)  Recent Labs    03/28/2019 2001 04/06/19 0038 04/06/19 0330  GLUCAP 141* 155* 195*         IMAGING x48h  - image(s) personally visualized  -   highlighted in bold Dg Chest Port 1 View  Result Date: 04/06/2019 CLINICAL DATA:  ETT placement EXAM: PORTABLE CHEST 1 VIEW COMPARISON:  Chest radiograph 03/26/2019 FINDINGS: ET tube terminates in the mid trachea. Right IJ catheter projects over the right apex. Enteric tube courses inferior to the diaphragm. Monitoring leads overlie the patient. Stable cardiomegaly. Persistent small to moderate layering bilateral pleural effusions with underlying consolidation. IMPRESSION: Stable support apparatus. Persistent moderate layering effusions with underlying consolidation. Electronically Signed   By: Lovey Newcomer M.D.   On: 04/06/2019 07:14   Dg Chest Port 1 View  Result Date: 03/19/2019 CLINICAL DATA:  Intubation. EXAM: PORTABLE CHEST 1 VIEW COMPARISON:  04/03/2019. FINDINGS: NG tube noted with tip over the stomach. Endotracheal tube and right IJ line stable position. Prior CABG. Cardiomegaly with diffuse bilateral pulmonary interstitial prominence and small bilateral pleural effusions. Dense left lung base atelectasis/consolidation. No pneumothorax. IMPRESSION: 1. NG tube noted with tip over the stomach. Endotracheal tube and right IJ line  stable position. 2. Prior CABG. Cardiomegaly with pulmonary venous congestion and diffuse bilateral pulmonary interstitial prominence. Bilateral pleural effusions. Findings suggest CHF. 3.  Dense left lung base atelectasis/consolidation. Electronically Signed   By: Marcello Moores  Register   On: 03/25/2019 06:31     Resolved Hospital Problem list     Assessment & Plan:   ASSESSMENT / PLAN:    Acute resp failuire due to cardiac arrest and multiple issues   04/06/2019 - > does not meet criteria for SBT/Extubation in setting of Acute Respiratory Failure due to mods though  Hypoxemia better   P:   Full vent support VAP      On cymbalta, seroquie, halcion,  and neurontin at home Acute encephalopathy - post arrest +  04/06/2019 - encephalopathich +  P:   Diprivan gtt - Fent gtt Monitor      Criculatory shock - some of this is diprivan mediated possibly and also cardiogenic Also, PVD with RLE acute ischemia  /- sp RKAA  04/06/2019 -  remains on pressors  P:  Levophed gtt; MAP goal > 65% Heparin gtt   Combined acute on chronic systolic and diastolic heart failure - baseline ef 45% Coronary artery disease, and hypertension.   Post arrest ef - 25%   P: Per cardiology   S/p VT arrest with cardioversion, presumed pulseless VT,  04/06/2019 - on oral amio for A fib  P: Per cardioogy   A:   No evidence of infection P:   [place]   A:  CKD III - baseline creat 1.3 (Hx Ureter cancer s/p L nephrectomy andL abdominal protuberance  AKI at admit post arrest  04/06/2019 - improving AKI. On bic gt for metabolic aciodis  P:  Monitor Continue bic gtt for now - but reduce rate to 10cc /h -> check abg and decide on dc v continue    Anemia critical illness   P:  - PRBC for hgb </= 6.9gm%    - exceptions are   -  if ACS susepcted/confirmed then transfuse for hgb </= 8.0gm%,  or    -  active bleeding with hemodynamic instability, then transfuse regardless of hemoglobin  value   At at all times try to transfuse 1 unit prbc as possible with exception of active hemorrhage       At risk hyperglyceia  - DM2 P:   ssi  Mid line sacral wound P monitor    Best practice:  Diet: NPO  Pain/Anxiety/Delirium protocol (if indicated): Fent, Precedex VAP protocol (if indicated): yes  DVT prophylaxis: SCD  GI prophylaxis: Pepcid Glucose control: SSI Mobility: BR  Code Status: Full   Family Communication: updated wife 1:36 PM 04/06/2019   Disposition: ICU   ATTESTATION & SIGNATURE   The patient [name] is critically ill with multiple organ systems failure and requires high complexity decision making for assessment and support, frequent evaluation and titration of therapies, application of advanced monitoring technologies and extensive interpretation of multiple databases.   Critical Care Time devoted to patient care services described in this note is  30  Minutes. This time reflects time of care of this signee Dr Brand Males. This critical care time does not reflect procedure time, or teaching time or supervisory time of PA/NP/Med student/Med Resident etc but could involve care discussion time     Dr. Brand Males, M.D., [facility].C.P Pulmonary and Critical Care Medicine Staff Physician [place] [facility] Pager: [phone], If no answer or between  15:00h - 7:00h: call [phone]  04/06/2019 8:12 AM

## 2019-04-06 NOTE — Progress Notes (Signed)
ANTICOAGULATION CONSULT NOTE - [place] for heparin Indication: chest pain/ACS  Allergies  Allergen Reactions  . Erythromycin Anaphylaxis and Rash  . Iohexol Anaphylaxis, Hives, Itching and Other (See Comments)    Note:  13 HR PRE-MED GIVEN AND SUCCESSFUL-ARS 07/15/07.     Marland Kitchen Penicillins Anaphylaxis, Itching and Other (See Comments)    Has patient had a PCN reaction causing immediate rash, facial/tongue/throat swelling, SOB or lightheadedness with hypotension: Yes Has patient had a PCN reaction causing severe rash involving mucus membranes or skin necrosis: No Has patient had a PCN reaction that required hospitalization No Has patient had a PCN reaction occurring within the last 10 years: No If all of the above answers are "NO", then may proceed with Cephalosporin use.    Patient Measurements: Height: 6' (182.9 cm) Weight: 221 lb 1.9 oz (100.3 kg) IBW/kg (Calculated) : 77.6 Heparin Dosing Weight: 97.3 kg  Vital Signs: Temp: 99.5 F (37.5 C) (08/01 0700) Temp Source: Bladder (08/01 0400) BP: 113/65 (08/01 0700) Pulse Rate: 88 (08/01 0700)  Labs: Recent Labs    04/03/19 2306  04/04/19 0209 04/04/19 1034  04/04/19 2211 03/09/2019 0448 03/08/2019 0649 04/06/19 0332  HGB 13.5   < > 13.2 13.6  --   --  12.1*  --  10.9*  HCT 42.3   < > 40.8 40.0  --   --  36.8*  --  33.7*  PLT 201  --  193  --   --   --  174  --  157  LABPROT  --   --  17.4*  --   --   --  20.0*  --  19.3*  INR  --   --  1.5*  --   --   --  1.7*  --  1.7*  HEPARINUNFRC  --   --  0.23*  --    < > 0.27*  --  0.33 0.32  CREATININE 2.70*  --  2.76*  --   --   --  2.09*  --  1.59*  CKTOTAL  --   --   --   --   --   --   --   --  91  CKMB  --   --   --   --   --   --   --   --  2.7  TROPONINIHS 128*  --   --   --   --   --   --   --   --    < > = values in this interval not displayed.    Estimated Creatinine Clearance: 48.5 mL/min (A) (by C-G formula based on SCr of 1.59 mg/dL  (H)).   Medical History: Past Medical History:  Diagnosis Date  . Arthritis    "qwhere" (07/09/2013)  . Asthma   . COPD (chronic obstructive pulmonary disease) ([place])    pt denies this hx on 07/09/2013; has chronic dyspnea  . Coronary artery disease    Reportedly multivessel disease-CABG  . Gout   . H/O hiatal hernia 1997  . H/O: rheumatic fever    "had rheumatic fever as a kid" (07/09/2013) -has murmur  . Hyperlipidemia with target LDL less than 70   . Hypertension   . Ischemic cardiomyopathy    By report,-ICD  . Myocardial infarction ([place]) 03/2002; ~ 2011  . Neuropathy   . PAD (peripheral artery disease) ([place]) 2014   Right SFA stent November 2014; status post left AKA  .  Pneumonia    "once" (07/09/2013)  . Renal disorder    "only have 1 kidney; due to go back soon to check the other one" (07/09/2013)  . RUPTURE ROTATOR CUFF 12/03/2009   Qualifier: Diagnosis of  By: Aline Brochure MD, Dorothyann Peng    . Type II diabetes mellitus ([place])   . Ureter cancer ([place]) 12/2001   "shut down my kidney & resulted in nephrectomy" (07/09/2013)  . UTI (urinary tract infection) 10/04/2018    Assessment: 78 yo male with VT and concern for ACS (hx MI and CABG) also with ischemic right foot now post R AKA.   Heparin restarted yesterday, levels continue to be at goal. No overt bleeding noted. Hgb down today, will follow closely. Pltc trending down to 157.  Plan: Continue heparin at 2050 units/hr Daily heparin level and cbc  Erin Hearing PharmD., BCPS Clinical Pharmacist 04/06/2019 7:48 AM

## 2019-04-06 NOTE — Progress Notes (Signed)
Patient ID: [name], male   DOB: 26-Jul-1941, 78 y.o.   MRN: [id number] Stable on vent Postop day 1 from right above-knee amputation

## 2019-04-06 NOTE — Progress Notes (Signed)
Progress Note   Subjective   Remains ill,  On vent.  S/p R AKA yesterday  Inpatient Medications    Scheduled Meds:  amiodarone  400 mg Per Tube BID   aspirin  81 mg Per Tube Daily   chlorhexidine gluconate (MEDLINE KIT)  15 mL Mouth Rinse BID   Chlorhexidine Gluconate Cloth  6 each Topical Daily   famotidine  20 mg Per Tube QHS   feeding supplement (PRO-STAT SUGAR FREE 64)  30 mL Per Tube BID   insulin aspart  0-15 Units Subcutaneous Q4H   ipratropium-albuterol  3 mL Nebulization Q4H   mouth rinse  15 mL Mouth Rinse 10 times per day   sodium chloride flush  10-40 mL Intracatheter Q12H   sodium chloride HYPERTONIC  4 mL Nebulization BID   Continuous Infusions:  sodium chloride Stopped (04/06/19 0042)   feeding supplement (VITAL AF 1.2 CAL) 60 mL/hr at 03/21/2019 1653   fentaNYL infusion INTRAVENOUS 75 mcg/hr (04/06/19 0800)   heparin 2,050 Units/hr (04/06/19 0800)   magnesium sulfate bolus IVPB     norepinephrine (LEVOPHED) Adult infusion Stopped (04/06/19 0750)   propofol (DIPRIVAN) infusion 10 mcg/kg/min (04/06/19 0800)    sodium bicarbonate  infusion 1000 mL 10 mL/hr at 04/06/19 0858   PRN Meds: acetaminophen (TYLENOL) oral liquid 160 mg/5 mL, fentaNYL (SUBLIMAZE) injection, fentaNYL (SUBLIMAZE) injection, sodium chloride flush   Vital Signs    Vitals:   04/06/19 0645 04/06/19 0700 04/06/19 0800 04/06/19 0823  BP: 103/60 113/65 113/74 113/74  Pulse: 88 88 (!) 108 94  Resp:    20  Temp: 99.3 F (37.4 C) 99.5 F (37.5 C) 99.7 F (37.6 C)   TempSrc:      SpO2: 96% 96% 95% 93%  Weight:      Height:        Intake/Output Summary (Last 24 hours) at 04/06/2019 0910 Last data filed at 04/06/2019 0800 Gross per 24 hour  Intake 5790.2 ml  Output 2035 ml  Net 3755.2 ml   Filed Weights   04/03/19 0441 04/04/19 0319 04/06/19 0335  Weight: 97.2 kg 105.7 kg 100.3 kg    Telemetry    Sinus with frequent ectopy - Personally Reviewed  Physical Exam     GEN- The patient is ill appearing, on vent Head- normocephalic, atraumatic Eyes-  Sclera clear, conjunctiva pink Ears- unable to assess Oropharynx- ETT Neck- supple, Lungs-  normal work of breathing Heart- Regular rate and rhythm  GI- soft, NT, ND, + BS Extremities-  + dependant edema MS- s/p R AKA Psych- unable to assess Neuro- unable to assess   Labs    Chemistry Recent Labs  Lab 04/03/19 2306  04/04/19 0209 04/04/19 1034 03/08/2019 0448 04/06/19 0332  NA 139   < > 139 141 143 142  K 4.2   < > 3.8 3.7 3.5 3.8  CL 104  --  106  --  106 103  CO2 20*  --  20*  --  27 30  GLUCOSE 142*  --  177*  --  197* 202*  BUN 63*  --  64*  --  60* 50*  CREATININE 2.70*  --  2.76*  --  2.09* 1.59*  CALCIUM 7.9*  --  8.1*  --  7.7* 7.5*  PROT 5.5*  --  5.3*  --   --  4.4*  ALBUMIN 2.3*  --  2.3*  --   --  1.6*  AST 948*  --  825*  --   --  49*  ALT 448*  --  439*  --   --  119*  ALKPHOS 214*  --  195*  --   --  87  BILITOT 0.3  --  0.6  --   --  0.3  GFRNONAA 22*  --  21*  --  30* 42*  GFRAA 25*  --  25*  --  35* 48*  ANIONGAP 15  --  13  --  10 9   < > = values in this interval not displayed.     Hematology Recent Labs  Lab 04/04/19 0209 04/04/19 1034 03/29/2019 0448 04/06/19 0332  WBC 9.4  --  7.1 6.1  RBC 4.51  --  4.17* 3.76*  HGB 13.2 13.6 12.1* 10.9*  HCT 40.8 40.0 36.8* 33.7*  MCV 90.5  --  88.2 89.6  MCH 29.3  --  29.0 29.0  MCHC 32.4  --  32.9 32.3  RDW 16.8*  --  16.5* 17.0*  PLT 193  --  174 157   Patient Profile     [name] Aguinaga Cuozziis a 78 y.o.malewith a hx of CAD-prior MI with CABG(per family CABG x5 was in 2004 -[place] report, has had at least one catheterization prior to his AKA with no stents.)resulting in ischemic cardiomyopathy with chronic combined systolic and diastolic heart failure), COPD,PAD (right SFA stent, left AKA and now R AKA), hypertension who is s/p VT cardiac arrest  03/27/2019.   Assessment & Plan    1.  VT arrest Remains quite ill Would consider coronary assessment when able.  Given renal failure, plans to avoid cath by Dr Claiborne Billings are noted I have reviewed Dr Olin Pia note.  I agree that he is too ill for ICD this admission.  Would anticipate lifevest at discharge. Amiodarone stopped by Dr Caryl Comes but restarted by Dr Claiborne Billings for PVCs. Would stop amiodarone when able  2. Acute on chronic combined systolic and diastolic CHF Medical management ongoing Therapy limited by hypotension/ pressors and renal failure  3. Cardiogenic shock Making slow recovery  4. CAD s/p CABG Consider CV risk stratification when able If creatinine continues to improve, may be able to consider cath  5. PVC S/p R AKA yesterday  6. Acute on chronic renal failure Creatinine continues to improve  7. Encephalopathy Multifactorial and complicated by anoxic brain injury Hopefully will improve  Critical care time was exclusive of separate billable procedures and treating other patients.  Critial care time was spent personally by me on the following activities: development of treatment plan,  evaluation of patients response to treatment, examining patient, ordering/ reviewing treatments/ interventions, lab studies, radiographic studies, pulse ox, and re-evaluation of patients condition.     The patient is critically ill with multiple organ systems failure and requires high complexity decision making for assessment and support, frequent evaluation and titration of therapies, application of advanced monitoring technologies and extensive interpretation of databases.   Critical care was necessary to treat or prevent immintent or life-threatening deterioration.   Total CCT spent directly with the patient today is 45 min  Thompson Grayer MD, [place] 04/06/2019 9:16 AM

## 2019-04-06 DEATH — deceased

## 2019-04-07 ENCOUNTER — Inpatient Hospital Stay (HOSPITAL_COMMUNITY): Payer: Medicare HMO

## 2019-04-07 LAB — GLUCOSE, CAPILLARY
Glucose-Capillary: 155 mg/dL — ABNORMAL HIGH (ref 70–99)
Glucose-Capillary: 159 mg/dL — ABNORMAL HIGH (ref 70–99)
Glucose-Capillary: 163 mg/dL — ABNORMAL HIGH (ref 70–99)
Glucose-Capillary: 165 mg/dL — ABNORMAL HIGH (ref 70–99)
Glucose-Capillary: 169 mg/dL — ABNORMAL HIGH (ref 70–99)
Glucose-Capillary: 176 mg/dL — ABNORMAL HIGH (ref 70–99)
Glucose-Capillary: 188 mg/dL — ABNORMAL HIGH (ref 70–99)

## 2019-04-07 LAB — BASIC METABOLIC PANEL
Anion gap: 8 (ref 5–15)
BUN: 48 mg/dL — ABNORMAL HIGH (ref 8–23)
CO2: 30 mmol/L (ref 22–32)
Calcium: 7.9 mg/dL — ABNORMAL LOW (ref 8.9–10.3)
Chloride: 103 mmol/L (ref 98–111)
Creatinine, Ser: 1.36 mg/dL — ABNORMAL HIGH (ref 0.61–1.24)
GFR calc Af Amer: 58 mL/min — ABNORMAL LOW (ref >60)
GFR calc non Af Amer: 50 mL/min — ABNORMAL LOW (ref >60)
Glucose, Bld: 179 mg/dL — ABNORMAL HIGH (ref 70–99)
Potassium: 3.4 mmol/L — ABNORMAL LOW (ref 3.5–5.1)
Sodium: 141 mmol/L (ref 135–145)

## 2019-04-07 LAB — CBC
HCT: 30.8 % — ABNORMAL LOW (ref 39.0–52.0)
Hemoglobin: 10 g/dL — ABNORMAL LOW (ref 13.0–17.0)
MCH: 28.9 pg (ref 26.0–34.0)
MCHC: 32.5 g/dL (ref 30.0–36.0)
MCV: 89 fL (ref 80.0–100.0)
Platelets: 152 10*3/uL (ref 150–400)
RBC: 3.46 MIL/uL — ABNORMAL LOW (ref 4.22–5.81)
RDW: 17.1 % — ABNORMAL HIGH (ref 11.5–15.5)
WBC: 5.6 10*3/uL (ref 4.0–10.5)
nRBC: 0 % (ref 0.0–0.2)

## 2019-04-07 LAB — PROTIME-INR
INR: 1.4 — ABNORMAL HIGH (ref 0.8–1.2)
Prothrombin Time: 16.6 seconds — ABNORMAL HIGH (ref 11.4–15.2)

## 2019-04-07 LAB — CK TOTAL AND CKMB (NOT AT ARMC)
CK, MB: 2.1 ng/mL (ref 0.5–5.0)
Relative Index: 1.1 (ref 0.0–2.5)
Total CK: 183 U/L (ref 49–397)

## 2019-04-07 LAB — HEPARIN LEVEL (UNFRACTIONATED)
Heparin Unfractionated: 0.15 IU/mL — ABNORMAL LOW (ref 0.30–0.70)
Heparin Unfractionated: 0.32 IU/mL (ref 0.30–0.70)

## 2019-04-07 LAB — PHOSPHORUS: Phosphorus: 1.8 mg/dL — ABNORMAL LOW (ref 2.5–4.6)

## 2019-04-07 LAB — MAGNESIUM: Magnesium: 2.1 mg/dL (ref 1.7–2.4)

## 2019-04-07 MED ORDER — CHLORHEXIDINE GLUCONATE CLOTH 2 % EX PADS: 6.0000 | MEDICATED_PAD | Freq: Every day | CUTANEOUS | Status: DC

## 2019-04-07 MED ORDER — SODIUM CHLORIDE 0.9 % IV SOLN: 250.0000 mL | INTRAVENOUS | Status: DC

## 2019-04-07 MED ORDER — FUROSEMIDE 10 MG/ML IJ SOLN
80.0000 mg | Freq: Once | INTRAMUSCULAR | Status: AC
  Administered 2019-04-07: 80 mg via INTRAVENOUS
  Filled 2019-04-07: qty 8

## 2019-04-07 MED ORDER — SODIUM CHLORIDE 0.9% FLUSH: 10.0000 mL | INTRAVENOUS | Status: DC | PRN

## 2019-04-07 MED ORDER — FUROSEMIDE 10 MG/ML IJ SOLN
40.0000 mg | Freq: Three times a day (TID) | INTRAMUSCULAR | Status: DC
  Administered 2019-04-07 – 2019-04-08 (×3): 40 mg via INTRAVENOUS
  Filled 2019-04-07 (×3): qty 4

## 2019-04-07 MED ORDER — NOREPINEPHRINE 4 MG/250ML-% IV SOLN: 0.0000 ug/min | INTRAVENOUS | Status: DC

## 2019-04-07 MED ORDER — POTASSIUM PHOSPHATES 15 MMOLE/5ML IV SOLN
10.0000 mmol | Freq: Once | INTRAVENOUS | Status: AC
  Administered 2019-04-07: 10 mmol via INTRAVENOUS
  Filled 2019-04-07: qty 3.33

## 2019-04-07 NOTE — Progress Notes (Signed)
ANTICOAGULATION CONSULT NOTE - [place] for heparin Indication: chest pain/ACS  Allergies  Allergen Reactions  . Erythromycin Anaphylaxis and Rash  . Iohexol Anaphylaxis, Hives, Itching and Other (See Comments)    Note:  13 HR PRE-MED GIVEN AND SUCCESSFUL-ARS 07/15/07.     Marland Kitchen Penicillins Anaphylaxis, Itching and Other (See Comments)    Has patient had a PCN reaction causing immediate rash, facial/tongue/throat swelling, SOB or lightheadedness with hypotension: Yes Has patient had a PCN reaction causing severe rash involving mucus membranes or skin necrosis: No Has patient had a PCN reaction that required hospitalization No Has patient had a PCN reaction occurring within the last 10 years: No If all of the above answers are "NO", then may proceed with Cephalosporin use.    Patient Measurements: Height: 6' (182.9 cm) Weight: 221 lb 1.9 oz (100.3 kg) IBW/kg (Calculated) : 77.6 Heparin Dosing Weight: 97.3 kg  Vital Signs: Temp: 99.9 F (37.7 C) (08/02 0500) BP: 106/62 (08/02 0500) Pulse Rate: 92 (08/02 0500)  Labs: Recent Labs    03/22/2019 0448 03/21/2019 0649 04/06/19 0332 04/07/19 0150 04/07/19 0434  HGB 12.1*  --  10.9*  --  10.0*  HCT 36.8*  --  33.7*  --  30.8*  PLT 174  --  157  --  152  LABPROT 20.0*  --  19.3*  --  16.6*  INR 1.7*  --  1.7*  --  1.4*  HEPARINUNFRC  --  0.33 0.32  --  0.15*  CREATININE 2.09*  --  1.59* 1.36*  --   CKTOTAL  --   --  91  --   --   CKMB  --   --  2.7  --   --     Estimated Creatinine Clearance: 56.7 mL/min (A) (by C-G formula based on SCr of 1.36 mg/dL (H)).   Medical History: Past Medical History:  Diagnosis Date  . Arthritis    "qwhere" (07/09/2013)  . Asthma   . COPD (chronic obstructive pulmonary disease) ([place])    pt denies this hx on 07/09/2013; has chronic dyspnea  . Coronary artery disease    Reportedly multivessel disease-CABG  . Gout   . H/O hiatal hernia 1997  . H/O: rheumatic fever    "had rheumatic fever as a kid" (07/09/2013) -has murmur  . Hyperlipidemia with target LDL less than 70   . Hypertension   . Ischemic cardiomyopathy    By report,-ICD  . Myocardial infarction ([place]) 03/2002; ~ 2011  . Neuropathy   . PAD (peripheral artery disease) ([place]) 2014   Right SFA stent November 2014; status post left AKA  . Pneumonia    "once" (07/09/2013)  . Renal disorder    "only have 1 kidney; due to go back soon to check the other one" (07/09/2013)  . RUPTURE ROTATOR CUFF 12/03/2009   Qualifier: Diagnosis of  By: Aline Brochure MD, Dorothyann Peng    . Type II diabetes mellitus ([place])   . Ureter cancer ([place]) 12/2001   "shut down my kidney & resulted in nephrectomy" (07/09/2013)  . UTI (urinary tract infection) 10/04/2018    Assessment: 78 yo male with VT and concern for ACS (hx MI and CABG) also with ischemic right foot now post R AKA.   Heparin continues, now at goal after rate adjustment overnight. No overt bleeding noted. Hgb down today, will follow closely. Pltc trending down to 152.  Plan: Continue heparin at 2350 units/hr Daily heparin level and cbc  Erin Hearing  PharmD., BCPS Clinical Pharmacist 04/07/2019 7:28 AM

## 2019-04-07 NOTE — Progress Notes (Signed)
Called regarding placement of central IV access. Patient is not requiring vasopressors.  Reviewed with RN at bedside.  Unable to reach family for consent.  Peripheral IV x2 placed on left - #22 g in left hand, #20 g in L FA with sterile technique, sites secured, dressing applied. He has easily accessible veins with ultrasound.  No role for central access at this time.     Noe Gens, NP-C [place] [facility] Pgr: [phone] or if no answer [phone] 04/07/2019, 11:06 AM

## 2019-04-07 NOTE — Progress Notes (Signed)
NAME:  [name], MRN:  [id number], DOB:  May 19, 1941, LOS: 8 ADMISSION DATE:  03/09/2019, CONSULTATION DATE:  04/04/2019 REFERRING MD:  Forestine Na EM (Dr. Sabra Heck), CHIEF COMPLAINT:  S/p VT and 1x cardioversion  Brief History   78 yo M s/p VT arrest s/p cardioversion x 1 by EMS, unclear if patient was pulseless or had pulse. Intubated at [facility] and transferred to [facility]  SARS-CoV2 Negative 03/13/2019  Past Medical History  COPD, DM2, CAD, MI, s/p CABG , Systolic heart failure ICM HTN, HLD, EtOH abuse, FTT, PVD , Ureter Cancer s/p L nephrectomy  CKD III   Significant Hospital Events   7/25 s/p VT arrest with ROSC after 1x cardioversion 120J. Intubated at [facility], CVC placed. Transferring to [facility] for ICU admission   7/26: Seen by vascular surgery for ischemic right limb felt that the extremity was not salvageable and recommended amputation.  7/28: Having episodes of desaturation during weaning efforts.  Vasoactive drips weaned off.  Being treated for possible infiltrate in right lower lobe.  7/29: Desaturated down to the 60s requiring bag valve mask ventilation, associated hypotension requiring escalation of vasoactive drips.  Arterial blood gas obtained showing pH of 7.16, PCO2 of 58, saturations 155 and bicarbonate 22, bicarbonate drip initiated  7/30: Vasopressin discontinued, now on just norepinephrine at decreasing dose.  Still having trouble weaning pressors.  Did get 1 dose of Lasix overnight.  Seen by vascular surgery planning for above-the-knee amputation  7/31  - slight improvement in renal function. 80% fio2, peep 12.levophed 7. ,diprivan gtt an fent gtt. More awake. R AKA later today  8/1-  s/p R AKA yesterday due to ischemic right lower extremity. Back on heparin gtt. Renal function better. CK/Lactate normal on diprivan gtt. Also on fentt gtt, levophed gtt, bic gtt. On TF. Vent at 60%, peep 8. LFT better  Consults:  Vascular surgery  Cardiology   Procedures:   7/25 ETT>> 7/25 CVC >>   Significant Diagnostic Tests:  CXR 7/25> ETT and CVC in adequate position. No pneumothorax. Cardiomegaly. Bilateral pulmonary infiltrates vs edema   CT Head 7/25 > NAICA CT Abdomen/Pelvis 7/25 - Bilateral pleural effusions, bibasilar airspace opacities, cardiomegaly, anasarca, absent left kidney  Micro Data:  7/25 SARS CoV2 > negative  7/26 BCX > 7/26 RCX > Normal flora  Antimicrobials:  7/25 Rocephin>> 04/06/2019   Interim history/subjective:    04/07/2019 -VVS pleased with wound healing of R AKA. On fent gtt, diprivan gtt and levophed gtt. Low dose bic gtt.+ Heparing gtt +  50% fio2 on vent/peep 8. AKI better - creat 1.5. Now +13.5L positive  Objective   Blood pressure 111/64, pulse 93, temperature 99.9 F (37.7 C), resp. rate (!) 21, height 6' (1.829 m), weight 100.3 kg, SpO2 93 %. CVP:  [10 mmHg-18 mmHg] 12 mmHg  Vent Mode: PRVC FiO2 (%):  [50 %-60 %] 50 % Set Rate:  [20 bmp] 20 bmp Vt Set:  [510 mL] 510 mL PEEP:  [8 cmH20] 8 cmH20 Plateau Pressure:  [12 cmH20-19 cmH20] 12 cmH20   Intake/Output Summary (Last 24 hours) at 04/07/2019 0815 Last data filed at 04/07/2019 0700 Gross per 24 hour  Intake 2711.82 ml  Output 1365 ml  Net 1346.82 ml   Filed Weights   04/03/19 0441 04/04/19 0319 04/06/19 0335  Weight: 97.2 kg 105.7 kg 100.3 kg    General Appearance:  Looks criticall ill. Volume Overloaded + Head:  Normocephalic, without obvious abnormality, atraumatic Eyes:  PERRL -  yes, conjunctiva/corneas - muddy     Ears:  Normal external ear canals, both ears Nose:  G tube - no Throat:  ETT TUBE - yes , OG tube - yes Neck:  Supple,  No enlargement/tenderness/nodules Lungs: Clear to auscultation bilaterally, Ventilator   Synchrony - yes Heart:  S1 and S2 normal, no murmur, CVP - x.  Pressors - yes Abdomen:  Soft, no masses, no organomegaly Genitalia / Rectal:  Not done Extremities:  Extremities- Bilateral AKA Skin:  ntact in exposed areas . Sacral  area - not examined Neurologic:  Sedation - diprivan and fent gtt -> RASS - -3 . Does perceive pain       LABS    PULMONARY Recent Labs  Lab 04/03/19 2217 04/03/19 2316 04/04/19 0050 04/04/19 1034 04/06/19 0936  PHART 7.157* 7.223*  --  7.327* 7.414  PCO2ART 59.0* 52.4*  --  42.5 48.6*  PO2ART 155.0* 170.0*  --  46.0* 94.5  HCO3 20.7 21.5  --  22.3 30.5*  TCO2 22 23  --  24  --   O2SAT 99.0 99.0 77.8 78.0 97.3    CBC Recent Labs  Lab 04/04/2019 0448 04/06/19 0332 04/07/19 0434  HGB 12.1* 10.9* 10.0*  HCT 36.8* 33.7* 30.8*  WBC 7.1 6.1 5.6  PLT 174 157 152    COAGULATION Recent Labs  Lab 04/03/19 0358 04/04/19 0209 03/09/2019 0448 04/06/19 0332 04/07/19 0434  INR 1.3* 1.5* 1.7* 1.7* 1.4*    CARDIAC  No results for input(s): TROPONINI in the last 168 hours. No results for input(s): PROBNP in the last 168 hours.   CHEMISTRY Recent Labs  Lab 04/01/19 0400 04/02/19 0413 04/02/19 1610 04/03/19 0358 04/03/19 1700  04/03/19 2306  04/04/19 0209 04/04/19 1034 04/01/2019 0448 04/06/19 0332 04/07/19 0150 04/07/19 0434  NA 136 137  --  137  --    < > 139   < > 139 141 143 142 141  --   K 4.5 4.0  --  4.3  --    < > 4.2   < > 3.8 3.7 3.5 3.8 3.4*  --   CL 103 104  --  107  --   --  104  --  106  --  106 103 103  --   CO2 23 20*  --  20*  --   --  20*  --  20*  --  27 30 30   --   GLUCOSE 90 95  --  150*  --   --  142*  --  177*  --  197* 202* 179*  --   BUN 36* 44*  --  50*  --   --  63*  --  64*  --  60* 50* 48*  --   CREATININE 2.42* 2.48*  --  2.47*  --   --  2.70*  --  2.76*  --  2.09* 1.59* 1.36*  --   CALCIUM 8.0* 7.8*  --  7.9*  --   --  7.9*  --  8.1*  --  7.7* 7.5* 7.9*  --   MG 2.0 1.8  --   --   --   --   --   --  2.1  --   --  1.6*  --  2.1  PHOS  --   --  5.0* 5.0* 6.0*  --   --   --   --   --   --  2.5  --  1.8*   < > =  values in this interval not displayed.   Estimated Creatinine Clearance: 56.7 mL/min (A) (by C-G formula based on SCr of  1.36 mg/dL (H)).   LIVER Recent Labs  Lab 04/03/19 0358 04/03/19 2306 04/04/19 0209 03/28/2019 0448 04/06/19 0332 04/07/19 0434  AST  --  948* 825*  --  49*  --   ALT  --  448* 439*  --  119*  --   ALKPHOS  --  214* 195*  --  87  --   BILITOT  --  0.3 0.6  --  0.3  --   PROT  --  5.5* 5.3*  --  4.4*  --   ALBUMIN  --  2.3* 2.3*  --  1.6*  --   INR 1.3*  --  1.5* 1.7* 1.7* 1.4*     INFECTIOUS Recent Labs  Lab 04/03/19 2306 04/04/19 0209 04/06/19 0332  LATICACIDVEN 2.2* 1.6 1.7     ENDOCRINE CBG (last 3)  Recent Labs    04/07/19 0030 04/07/19 0146 04/07/19 0423  GLUCAP 163* 169* 165*         IMAGING x48h  - image(s) personally visualized  -   highlighted in bold Dg Chest Port 1 View  Result Date: 04/06/2019 CLINICAL DATA:  ETT placement EXAM: PORTABLE CHEST 1 VIEW COMPARISON:  Chest radiograph 03/13/2019 FINDINGS: ET tube terminates in the mid trachea. Right IJ catheter projects over the right apex. Enteric tube courses inferior to the diaphragm. Monitoring leads overlie the patient. Stable cardiomegaly. Persistent small to moderate layering bilateral pleural effusions with underlying consolidation. IMPRESSION: Stable support apparatus. Persistent moderate layering effusions with underlying consolidation. Electronically Signed   By: Lovey Newcomer M.D.   On: 04/06/2019 07:14     Resolved Hospital Problem list     Assessment & Plan:   ASSESSMENT / PLAN:    Acute resp failuire due to cardiac arrest and multiple issues   04/07/2019 - > does nota meet criteria for SBT/Extubation in setting of Acute Respiratory Failure due to  MODS    P:   PRVC: full vent support VAP      On cymbalta, seroquie, halcion,  and neurontin at home Acute encephalopathy - post arrest +  04/07/2019 - encephalopathic +  P:   Diprivan gtt - Fent gtt Monitor      Criculatory shock - some of this is diprivan mediated possibly and also cardiogenic Also, PVD with RLE acute  ischemia  /- sp RKAA  04/07/2019 - remains on pressors  P:  Levophed gtt; MAP goal > 65% Heparin gtt   Combined acute on chronic systolic and diastolic heart failure - baseline ef 45% Coronary artery disease, and hypertension. -  Post arrest ef - 25%   04/07/2019 - 13.5L Volume overload since admission  P: Per cardiology Start lasix   S/p VT arrest with cardioversion, presumed pulseless VT,  04/07/2019 - on oral amio for A fib  P: Per cardioogy   A:   No evidence of infection P:   [place]   A:  CKD III - baseline creat 1.3 (Hx Ureter cancer s/p L nephrectomy andL abdominal protuberance  AKI at admit post arrest  04/07/2019 - improving AKI. ABG without acidosis on very low bic gtt P:  Monitor Dc bic gtt    Anemia critical illness   P:  - PRBC for hgb </= 6.9gm%    - exceptions are   -  if ACS susepcted/confirmed then transfuse for hgb </= 8.0gm%,  or    -  active bleeding with hemodynamic instability, then transfuse regardless of hemoglobin value   At at all times try to transfuse 1 unit prbc as possible with exception of active hemorrhage       At risk hyperglyceia  - DM2 P:   ssi  Mid line sacral wound P monitor  Rt AKA 04/02/2019  - 04/07/2019 - healthy wound  Plan VVS   Best practice:  Diet: NPO  Pain/Anxiety/Delirium protocol (if indicated): Fent, diprivan VAP protocol (if indicated): yes  DVT prophylaxis: SCD  GI prophylaxis: Pepcid Glucose control: SSI Mobility: BR  Code Status: Full   Family Communication: updated wife 8:38 AM 04/07/2019   Disposition: ICU   ATTESTATION & SIGNATURE   The patient [name] is critically ill with multiple organ systems failure and requires high complexity decision making for assessment and support, frequent evaluation and titration of therapies, application of advanced monitoring technologies and extensive interpretation of multiple databases.   Critical Care Time devoted to patient care  services described in this note is  30  Minutes. This time reflects time of care of this signee Dr Brand Males. This critical care time does not reflect procedure time, or teaching time or supervisory time of PA/NP/Med student/Med Resident etc but could involve care discussion time     Dr. Brand Males, M.D., [facility].C.P Pulmonary and Critical Care Medicine Staff Physician [place] [facility] Pager: [phone], If no answer or between  15:00h - 7:00h: call [phone]  04/07/2019 8:15 AM

## 2019-04-07 NOTE — Progress Notes (Signed)
Progress Note   Subjective   Remains on the ventilator.  Ill.  S/p R AKA on 03/18/2019.  Inpatient Medications    Scheduled Meds:  amiodarone  400 mg Per Tube BID   aspirin  81 mg Per Tube Daily   chlorhexidine       chlorhexidine gluconate (MEDLINE KIT)  15 mL Mouth Rinse BID   Chlorhexidine Gluconate Cloth  6 each Topical Daily   famotidine  20 mg Per Tube QHS   feeding supplement (PRO-STAT SUGAR FREE 64)  30 mL Per Tube BID   furosemide  40 mg Intravenous Q8H   furosemide  80 mg Intravenous Once   insulin aspart  0-15 Units Subcutaneous Q4H   ipratropium-albuterol  3 mL Nebulization Q4H   mouth rinse  15 mL Mouth Rinse 10 times per day   sodium chloride flush  10-40 mL Intracatheter Q12H   Continuous Infusions:  sodium chloride Stopped (04/06/19 1243)   feeding supplement (VITAL AF 1.2 CAL) 1,000 mL (04/07/19 0701)   fentaNYL infusion INTRAVENOUS 75 mcg/hr (04/07/19 0700)   heparin 2,250 Units/hr (04/07/19 0700)   norepinephrine (LEVOPHED) Adult infusion 2 mcg/min (04/07/19 0700)   potassium PHOSPHATE IVPB (in mmol)     propofol (DIPRIVAN) infusion 25 mcg/kg/min (04/07/19 0700)   PRN Meds: acetaminophen (TYLENOL) oral liquid 160 mg/5 mL, fentaNYL (SUBLIMAZE) injection, fentaNYL (SUBLIMAZE) injection, sodium chloride flush   Vital Signs    Vitals:   04/07/19 0715 04/07/19 0730 04/07/19 0745 04/07/19 0836  BP: 111/65 108/70 111/64 108/63  Pulse: 94 94 93 94  Resp:    (!) 21  Temp: 99.9 F (37.7 C) 99.9 F (37.7 C) 99.9 F (37.7 C)   TempSrc:      SpO2: 95% 94% 93% 95%  Weight:      Height:        Intake/Output Summary (Last 24 hours) at 04/07/2019 0847 Last data filed at 04/07/2019 0700 Gross per 24 hour  Intake 2711.82 ml  Output 1365 ml  Net 1346.82 ml   Filed Weights   04/03/19 0441 04/04/19 0319 04/06/19 0335  Weight: 97.2 kg 105.7 kg 100.3 kg    Telemetry    sinus - Personally Reviewed  Physical Exam   GEN- The patient is  ill appearing, sedated on vent Head- normocephalic, atraumatic Eyes-  Sclera clear, conjunctiva pink Ears- hearing intact Oropharynx- clear Neck- supple, Lungs- decreased BS throughout, on vent Heart- Regular rate and rhythm  GI- soft, NT, ND, + BS Extremities- no clubbing, cyanosis,+ dependant  edema  MS- s/p amputation Skin- no rash or lesion Psych- unable to assess Neuro- sedated   Labs    Chemistry Recent Labs  Lab 04/03/19 2306  04/04/19 0209  03/22/2019 0448 04/06/19 0332 04/07/19 0150  NA 139   < > 139   < > 143 142 141  K 4.2   < > 3.8   < > 3.5 3.8 3.4*  CL 104  --  106  --  106 103 103  CO2 20*  --  20*  --  27 30 30   GLUCOSE 142*  --  177*  --  197* 202* 179*  BUN 63*  --  64*  --  60* 50* 48*  CREATININE 2.70*  --  2.76*  --  2.09* 1.59* 1.36*  CALCIUM 7.9*  --  8.1*  --  7.7* 7.5* 7.9*  PROT 5.5*  --  5.3*  --   --  4.4*  --   ALBUMIN  2.3*  --  2.3*  --   --  1.6*  --   AST 948*  --  825*  --   --  49*  --   ALT 448*  --  439*  --   --  119*  --   ALKPHOS 214*  --  195*  --   --  87  --   BILITOT 0.3  --  0.6  --   --  0.3  --   GFRNONAA 22*  --  21*  --  30* 42* 50*  GFRAA 25*  --  25*  --  35* 48* 58*  ANIONGAP 15  --  13  --  10 9 8    < > = values in this interval not displayed.     Hematology Recent Labs  Lab 04/01/2019 0448 04/06/19 0332 04/07/19 0434  WBC 7.1 6.1 5.6  RBC 4.17* 3.76* 3.46*  HGB 12.1* 10.9* 10.0*  HCT 36.8* 33.7* 30.8*  MCV 88.2 89.6 89.0  MCH 29.0 29.0 28.9  MCHC 32.9 32.3 32.5  RDW 16.5* 17.0* 17.1*  PLT 174 157 152   Patient Profile   [name] Ybarbo Cuozziis a 78 y.o.malewith a hx of CAD-prior MI with CABG(per family CABG x5 was in 2004 -[facility]-by report, has had at least one catheterization prior to his AKA with no stents.)resulting in ischemic cardiomyopathy with chronic combined systolic and diastolic heart failure), COPD,PAD (right SFA stent, left AKA and now R AKA),  hypertension who is s/p VT cardiac arrest 03/08/2019.   Assessment & Plan    1.  VT arrest Remains ill, intubated and now s/p AKA Ultimately, palliative measures should be considered for goals of care. Stop amiodarone and follow Dr Caryl Comes and I agree that he would be too ill for ICD this admission.  Would need substantial goals of care conversations prior to either lifevest or ICD.  Would also need coronary assessment prior to determining if he is an ICD candidate  2. Cardiogenic shock Slowly improving  3. Acute on chronic combined systolic/diastolic CHF Continue medical management (limited by hypotension)  4. Acute on chronic renal failure Continues to improve  5. PVD S/p R AKA 03/25/2019 Dr Early is following  Prognosis is very poor.  Consider palliative discussions  Critical care time was exclusive of separate billable procedures and treating other patients.  Critial care time was spent personally by me (independant of midlevel providers) on the following activities: development of treatment plan as well as nursing, discussions with consultants, evaluation of patients response to treatment, examining patient, obtaining history from patient or surrogate, ordering/ reviewing treatments/ interventions, lab studies, radiographic studies, pulse ox, and re-evaluation of patients condition.     The patient is critically ill with multiple organ systems failure and requires high complexity decision making for assessment and support, frequent evaluation and titration of therapies, application of advanced monitoring technologies and extensive interpretation of databases.   Critical care was necessary to treat or prevent immintent or life-threatening deterioration.  Total CCT spent directly with the patient today is 30  Thompson Grayer MD, [facility] 04/07/2019 8:51 AM

## 2019-04-07 NOTE — Progress Notes (Addendum)
[place] for heparin Indication: chest pain/ACS  Allergies  Allergen Reactions  . Erythromycin Anaphylaxis and Rash  . Iohexol Anaphylaxis, Hives, Itching and Other (See Comments)    Note:  13 HR PRE-MED GIVEN AND SUCCESSFUL-ARS 07/15/07.     Marland Kitchen Penicillins Anaphylaxis, Itching and Other (See Comments)    Has patient had a PCN reaction causing immediate rash, facial/tongue/throat swelling, SOB or lightheadedness with hypotension: Yes Has patient had a PCN reaction causing severe rash involving mucus membranes or skin necrosis: No Has patient had a PCN reaction that required hospitalization No Has patient had a PCN reaction occurring within the last 10 years: No If all of the above answers are "NO", then may proceed with Cephalosporin use.    Patient Measurements: Height: 6' (182.9 cm) Weight: 221 lb 1.9 oz (100.3 kg) IBW/kg (Calculated) : 77.6 Heparin Dosing Weight: 97.3 kg  Vital Signs: Temp: 99.9 F (37.7 C) (08/02 0500) Temp Source: Bladder (08/01 1900) BP: 106/62 (08/02 0500) Pulse Rate: 92 (08/02 0500)  Labs: Recent Labs    03/11/2019 0448 04/01/2019 0649 04/06/19 0332 04/07/19 0150 04/07/19 0434  HGB 12.1*  --  10.9*  --  10.0*  HCT 36.8*  --  33.7*  --  30.8*  PLT 174  --  157  --  152  LABPROT 20.0*  --  19.3*  --  16.6*  INR 1.7*  --  1.7*  --  1.4*  HEPARINUNFRC  --  0.33 0.32  --  0.15*  CREATININE 2.09*  --  1.59* 1.36*  --   CKTOTAL  --   --  91  --   --   CKMB  --   --  2.7  --   --     Estimated Creatinine Clearance: 56.7 mL/min (A) (by C-G formula based on SCr of 1.36 mg/dL (H)).  Assessment: 78 yo male s/p VT arrest, poss ACS and ischemic right foot s/p AKA, for heparin Goal of Therapy:  Heparin level 0.3-0.7 units/ml Monitor platelets by anticoagulation protocol: Yes   Plan:  Increase Heparin 2250 units/hr Check heparin level in 8 hours.    Phillis Knack, PharmD, BCPS

## 2019-04-07 NOTE — Progress Notes (Signed)
Patient ID: [name], male   DOB: 1940-10-25, 78 y.o.   MRN: [id number]   Intubated.  Minimally responsive. Right AKA wound dressing removed.  Excellent healing with no evidence of skin edge breakdown.

## 2019-04-08 ENCOUNTER — Inpatient Hospital Stay (HOSPITAL_COMMUNITY): Payer: Medicare HMO

## 2019-04-08 ENCOUNTER — Encounter (HOSPITAL_COMMUNITY): Payer: Self-pay | Admitting: Vascular Surgery

## 2019-04-08 LAB — CBC
HCT: 31.2 % — ABNORMAL LOW (ref 39.0–52.0)
Hemoglobin: 10.3 g/dL — ABNORMAL LOW (ref 13.0–17.0)
MCH: 28.9 pg (ref 26.0–34.0)
MCHC: 33 g/dL (ref 30.0–36.0)
MCV: 87.4 fL (ref 80.0–100.0)
Platelets: 156 10*3/uL (ref 150–400)
RBC: 3.57 MIL/uL — ABNORMAL LOW (ref 4.22–5.81)
RDW: 16.6 % — ABNORMAL HIGH (ref 11.5–15.5)
WBC: 5.8 10*3/uL (ref 4.0–10.5)
nRBC: 0 % (ref 0.0–0.2)

## 2019-04-08 LAB — HEPATIC FUNCTION PANEL
ALT: 99 U/L — ABNORMAL HIGH (ref 0–44)
AST: 95 U/L — ABNORMAL HIGH (ref 15–41)
Albumin: 1.5 g/dL — ABNORMAL LOW (ref 3.5–5.0)
Alkaline Phosphatase: 95 U/L (ref 38–126)
Bilirubin, Direct: 0.2 mg/dL (ref 0.0–0.2)
Indirect Bilirubin: 0.4 mg/dL (ref 0.3–0.9)
Total Bilirubin: 0.6 mg/dL (ref 0.3–1.2)
Total Protein: 4.7 g/dL — ABNORMAL LOW (ref 6.5–8.1)

## 2019-04-08 LAB — LACTIC ACID, PLASMA
Lactic Acid, Venous: 1.1 mmol/L (ref 0.5–1.9)
Lactic Acid, Venous: 1.1 mmol/L (ref 0.5–1.9)

## 2019-04-08 LAB — GLUCOSE, CAPILLARY
Glucose-Capillary: 134 mg/dL — ABNORMAL HIGH (ref 70–99)
Glucose-Capillary: 153 mg/dL — ABNORMAL HIGH (ref 70–99)
Glucose-Capillary: 164 mg/dL — ABNORMAL HIGH (ref 70–99)
Glucose-Capillary: 165 mg/dL — ABNORMAL HIGH (ref 70–99)
Glucose-Capillary: 179 mg/dL — ABNORMAL HIGH (ref 70–99)
Glucose-Capillary: 186 mg/dL — ABNORMAL HIGH (ref 70–99)

## 2019-04-08 LAB — MAGNESIUM: Magnesium: 1.7 mg/dL (ref 1.7–2.4)

## 2019-04-08 LAB — TRIGLYCERIDES: Triglycerides: 127 mg/dL (ref <150)

## 2019-04-08 LAB — HEPARIN LEVEL (UNFRACTIONATED): Heparin Unfractionated: 0.31 IU/mL (ref 0.30–0.70)

## 2019-04-08 LAB — PROCALCITONIN: Procalcitonin: 0.73 ng/mL

## 2019-04-08 LAB — PROTIME-INR
INR: 1.3 — ABNORMAL HIGH (ref 0.8–1.2)
Prothrombin Time: 15.6 seconds — ABNORMAL HIGH (ref 11.4–15.2)

## 2019-04-08 LAB — PHOSPHORUS: Phosphorus: 2.5 mg/dL (ref 2.5–4.6)

## 2019-04-08 MED ORDER — MAGNESIUM SULFATE 2 GM/50ML IV SOLN
2.0000 g | Freq: Once | INTRAVENOUS | Status: AC
  Administered 2019-04-08: 13:00:00 2 g via INTRAVENOUS
  Filled 2019-04-08: qty 50

## 2019-04-08 MED ORDER — CHLORHEXIDINE GLUCONATE 0.12 % MT SOLN
OROMUCOSAL | Status: AC
  Filled 2019-04-08: qty 15

## 2019-04-08 MED ORDER — PIPERACILLIN-TAZOBACTAM 3.375 G IVPB
3.3750 g | Freq: Three times a day (TID) | INTRAVENOUS | Status: DC
  Administered 2019-04-08 – 2019-04-11 (×8): 3.375 g via INTRAVENOUS
  Filled 2019-04-08 (×8): qty 50

## 2019-04-08 MED ORDER — VANCOMYCIN HCL 10 G IV SOLR
1250.0000 mg | INTRAVENOUS | Status: DC
  Administered 2019-04-09 – 2019-04-10 (×2): 1250 mg via INTRAVENOUS
  Filled 2019-04-08 (×3): qty 1250

## 2019-04-08 MED ORDER — VANCOMYCIN HCL 10 G IV SOLR
2000.0000 mg | Freq: Once | INTRAVENOUS | Status: AC
  Administered 2019-04-08: 2000 mg via INTRAVENOUS
  Filled 2019-04-08: qty 2000

## 2019-04-08 MED ORDER — FUROSEMIDE 10 MG/ML IJ SOLN
80.0000 mg | Freq: Three times a day (TID) | INTRAMUSCULAR | Status: DC
  Administered 2019-04-08: 80 mg via INTRAVENOUS
  Filled 2019-04-08: qty 8

## 2019-04-08 MED ORDER — DEXTROSE 50 % IV SOLN
INTRAVENOUS | Status: AC
  Filled 2019-04-08: qty 50

## 2019-04-08 NOTE — Progress Notes (Signed)
   RN calling - febile  Recent Labs  Lab 04/04/19 0209 04/04/2019 0448 04/06/19 0332 04/07/19 0434 04/08/19 0452  WBC 9.4 7.1 6.1 5.6 5.8   No results for input(s): PROCALCITON in the last 168 hours.   Recent Labs  Lab 04/04/19 0209 04/06/19 0332 04/08/19 0032  LATICACIDVEN 1.6 1.7 1.1   Recent Labs  Lab 04/03/19 2306 04/04/19 0209 03/19/2019 0448 04/06/19 0332 04/07/19 0150  CREATININE 2.70* 2.76* 2.09* 1.59* 1.36*     A - fever  P  = pct, lactate, pan culture - vanc and zosyn - hold lasix   Anti-infectives (From admission, onward)   Start     Dose/Rate Route Frequency Ordered Stop   03/11/2019 0600  ceFAZolin (ANCEF) IVPB 2g/100 mL premix    Note to Pharmacy: Send with pt to OR   2 g 200 mL/hr over 30 Minutes Intravenous To Short Stay 04/04/19 0757 04/06/19 0600   04/03/19 2100  cefTRIAXone (ROCEPHIN) 2 g in sodium chloride 0.9 % 100 mL IVPB     2 g 200 mL/hr over 30 Minutes Intravenous Every 24 hours 04/03/19 0842 03/17/2019 2219   04/03/19 0900  metroNIDAZOLE (FLAGYL) IVPB 500 mg  Status:  Discontinued     500 mg 100 mL/hr over 60 Minutes Intravenous Every 8 hours 04/03/19 0842 04/04/19 1031   03/31/19 2200  cefTRIAXone (ROCEPHIN) 2 g in sodium chloride 0.9 % 100 mL IVPB  Status:  Discontinued     2 g 200 mL/hr over 30 Minutes Intravenous Every 24 hours 03/31/19 1431 04/03/19 0834   03/09/2019 2330  cefTRIAXone (ROCEPHIN) 1 g in sodium chloride 0.9 % 100 mL IVPB  Status:  Discontinued     1 g 200 mL/hr over 30 Minutes Intravenous Every 24 hours 03/09/2019 2325 03/31/19 1431       SIGNATURE    Dr. Brand Males, M.D., F.C.C.P,  Pulmonary and Critical Care Medicine Staff Physician, [place] Director - Interstitial Lung Disease  Program  Pulmonary [place] [facility] [place], [place], [address]  Pager: [phone], If no answer or between  15:00h - 7:00h: call [phone] Telephone: [phone]  6:53 PM 04/08/2019

## 2019-04-08 NOTE — Progress Notes (Signed)
ANTICOAGULATION CONSULT NOTE - [place] for heparin Indication: chest pain/ACS  Allergies  Allergen Reactions  . Erythromycin Anaphylaxis and Rash  . Iohexol Anaphylaxis, Hives, Itching and Other (See Comments)    Note:  13 HR PRE-MED GIVEN AND SUCCESSFUL-ARS 07/15/07.     Marland Kitchen Penicillins Anaphylaxis, Itching and Other (See Comments)    Has patient had a PCN reaction causing immediate rash, facial/tongue/throat swelling, SOB or lightheadedness with hypotension: Yes Has patient had a PCN reaction causing severe rash involving mucus membranes or skin necrosis: No Has patient had a PCN reaction that required hospitalization No Has patient had a PCN reaction occurring within the last 10 years: No If all of the above answers are "NO", then may proceed with Cephalosporin use.    Patient Measurements: Height: 6' (182.9 cm) Weight: 226 lb 3.1 oz (102.6 kg) IBW/kg (Calculated) : 77.6 Heparin Dosing Weight: 97.3 kg  Vital Signs: Temp: 99.9 F (37.7 C) (08/03 0800) BP: 112/69 (08/03 0800) Pulse Rate: 104 (08/03 0800)  Labs: Recent Labs    04/06/19 0332 04/07/19 0150 04/07/19 0434 04/07/19 1229 04/08/19 0452  HGB 10.9*  --  10.0*  --  10.3*  HCT 33.7*  --  30.8*  --  31.2*  PLT 157  --  152  --  156  LABPROT 19.3*  --  16.6*  --  15.6*  INR 1.7*  --  1.4*  --  1.3*  HEPARINUNFRC 0.32  --  0.15* 0.32 0.31  CREATININE 1.59* 1.36*  --   --   --   CKTOTAL 91  --   --  183  --   CKMB 2.7  --   --  2.1  --     Estimated Creatinine Clearance: 57.3 mL/min (A) (by C-G formula based on SCr of 1.36 mg/dL (H)).   Medical History: Past Medical History:  Diagnosis Date  . Arthritis    "qwhere" (07/09/2013)  . Asthma   . COPD (chronic obstructive pulmonary disease) ([place])    pt denies this hx on 07/09/2013; has chronic dyspnea  . Coronary artery disease    Reportedly multivessel disease-CABG  . Gout   . H/O hiatal hernia 1997  . H/O: rheumatic fever    "had rheumatic fever as a kid" (07/09/2013) -has murmur  . Hyperlipidemia with target LDL less than 70   . Hypertension   . Ischemic cardiomyopathy    By report,-ICD  . Myocardial infarction ([place]) 03/2002; ~ 2011  . Neuropathy   . PAD (peripheral artery disease) ([place]) 2014   Right SFA stent November 2014; status post left AKA  . Pneumonia    "once" (07/09/2013)  . Renal disorder    "only have 1 kidney; due to go back soon to check the other one" (07/09/2013)  . RUPTURE ROTATOR CUFF 12/03/2009   Qualifier: Diagnosis of  By: Aline Brochure MD, Dorothyann Peng    . Type II diabetes mellitus ([place])   . Ureter cancer ([place]) 12/2001   "shut down my kidney & resulted in nephrectomy" (07/09/2013)  . UTI (urinary tract infection) 10/04/2018    Assessment: 78 yo male with VT and concern for ACS (hx MI and CABG) also with ischemic right foot now post R AKA.   Heparin level (0.31) therapeutic after increasing drip rate to 2350 units/hr. Hgb (10.3) improved slightly from yesterday. PLT trending down but stable at 156. No bleeding or infusion issues per RN. Will increase rate slightly to maintain therapeutic level.  Plan: Increase heparin  to 2450 units/hr Daily heparin level and cbc Monitor signs of bleeding  Richardine Service, PharmD PGY1 Pharmacy Resident Phone: [phone] 04/08/2019  9:15 AM  Please check [email or web address] for unit-specific pharmacy phone numbers.

## 2019-04-08 NOTE — Consult Note (Addendum)
Neurology Consultation Reason for Consult: Nonspecific movements   Referring Physician: Dr. Chase Caller  CC: Twitching movements  History is obtained from: EMR and staff   HPI: [name] is a 78 y.o. male with CAD s/p CABG, IDDM, COPD, PVD, ischemic cardiomyopathy, CKD3, hx of ureteral cancer s/p left nephrectomy presented 7/25 post pulseless vtach arrest and wife started cpr. Presumed pulselessness which prompted cardioversion with resultant narrow complex tachycardia. The patient did not get epinephrine, cpr, but was intubated. He is in cardiac icu being managed for acute respiratory failure due to cardiac arrest.   Patient developed ischemic right lower extremity which prompted right aka on 7/31.   Neurology consulted this morning due to "tardive dyskinesia like movements with chewing in mouth" that was concerning for possible seizure activity.   ROS: A 14 point ROS was performed and is negative except as noted in the HPI. Unable to obtain due to altered mental status.   Past Medical History:  Diagnosis Date  . Arthritis    "qwhere" (07/09/2013)  . Asthma   . COPD (chronic obstructive pulmonary disease) ([place])    pt denies this hx on 07/09/2013; has chronic dyspnea  . Coronary artery disease    Reportedly multivessel disease-CABG  . Gout   . H/O hiatal hernia 1997  . H/O: rheumatic fever    "had rheumatic fever as a kid" (07/09/2013) -has murmur  . Hyperlipidemia with target LDL less than 70   . Hypertension   . Ischemic cardiomyopathy    By report,-ICD  . Myocardial infarction ([place]) 03/2002; ~ 2011  . Neuropathy   . PAD (peripheral artery disease) ([place]) 2014   Right SFA stent November 2014; status post left AKA  . Pneumonia    "once" (07/09/2013)  . Renal disorder    "only have 1 kidney; due to go back soon to check the other one" (07/09/2013)  . RUPTURE ROTATOR CUFF 12/03/2009   Qualifier: Diagnosis of  By: Aline Brochure MD, Dorothyann Peng    . Type II diabetes mellitus ([place])   .  Ureter cancer ([place]) 12/2001   "shut down my kidney & resulted in nephrectomy" (07/09/2013)  . UTI (urinary tract infection) 10/04/2018     Family History  Problem Relation Age of Onset  . Cancer Mother   . Heart attack Father      Social History:  reports that he has been smoking cigarettes. He has a 81.00 pack-year smoking history. He has never used smokeless tobacco. He reports that he does not drink alcohol or use drugs.   Exam: Current vital signs: BP 119/70   Pulse 100   Temp (!) 100.8 F (38.2 C)   Resp (!) 27   Ht 6' (1.829 m)   Wt 102.6 kg   SpO2 95%   BMI 30.68 kg/m  Vital signs in last 24 hours: Temp:  [99.7 F (37.6 C)-100.8 F (38.2 C)] 100.8 F (38.2 C) (08/03 1200) Pulse Rate:  [89-104] 100 (08/03 1208) Resp:  [20-28] 27 (08/03 1208) BP: (88-129)/(57-105) 119/70 (08/03 1200) SpO2:  [92 %-97 %] 95 % (08/03 1208) Arterial Line BP: (84-126)/(47-72) 114/61 (08/03 1200) FiO2 (%):  [40 %-60 %] 40 % (08/03 1208) Weight:  [102.6 kg] 102.6 kg (08/03 0500)   Physical Exam  Constitutional: Appears well-developed and well-nourished.  Psych: Appears agitated Eyes: No scleral injection HENT: No OP obstrucion Head: Normocephalic.  Cardiovascular: Normal rate and regular rhythm.  Respiratory: Effort normal, non-labored breathing. Intubated on vent GI: Soft.  No distension. There  is no tenderness.  Skin: WDI  Neuro: Patient is intubated and not able to participate in examination.  Pupils are equal, round, and reactive to light. No gaze preference.  Oculocephalic reflex intact  Flexion to noxious stimuli under fingernailbed and with noxious stimuli in thigh Tone is normal. Bulk is normal.  I have reviewed labs in epic and the results pertinent to this consultation are:  Lactic acid 1.1 Triglycerides 127 CBC wbc 5.8, hb 10.3 hct 31.2, plt 156 Mg 1.7 Po4 2.5  I have reviewed the images obtained:  Impression:  78 y.o male presented post vtach arrest and  right lower extremity critical limb ischemia s/p right aka. His tardive dyskinesia/chewing movement is likely a result of anoxic brain injury and is likely not seizure like.   Recommendations:  -EEG showing myogenic artifact without any epileptiform movements -primary team to address underlying acute respiratory failure -neurology will sign off   Lars Mage, MD Internal Medicine PGY3 [id number]:[phone] 04/08/2019, 12:42 PM

## 2019-04-08 NOTE — Procedures (Signed)
Patient Name: [name]  MRN: [id number]  Epilepsy Attending: Lora Havens  Referring Physician/Provider: Dr Roland Rack Date: 04/08/19 Duration: 29.35 mins  Patient history: 78yo M with cardiac arrest. EEG to evaluate for seizure  Level of alertness: lethargic  AEDs during EEG study: propofol  Technical aspects: This EEG study was done with scalp electrodes positioned according to the 10-20 International system of electrode placement. Electrical activity was acquired at a sampling rate of 500Hz  and reviewed with a high frequency filter of 70Hz  and a low frequency filter of 1Hz . EEG data were recorded continuously and digitally stored.   DESCRIPTION: This EEG was limited due to significant myogenic artifact. One episode of "chewing movement" was recorded at 1237 during which patient appeared to open and close his mouth repeatedly. No clear seizure was seen before, during and after this episode. Hyperventilation and photic stimulation were not performed.  IMPRESSION: This study is technically difficult due to significant myogenic artifact. One episode of "chewing movement" as described above was captured at 1237 with no clear EEG changes to suggest seizures.   Treating provider Dr Leonel Ramsay was notified.    Rohn Fritsch Barbra Sarks

## 2019-04-08 NOTE — Progress Notes (Signed)
EEG complete - results pending 

## 2019-04-08 NOTE — Progress Notes (Signed)
NAME:  [name], MRN:  [id number], DOB:  [birth year], LOS: 9 ADMISSION DATE:  03/28/2019, CONSULTATION DATE:  03/16/2019 REFERRING MD:  Forestine Na EM (Dr. Sabra Heck), CHIEF COMPLAINT:  S/p VT and 1x cardioversion  Brief History   78 yo M s/p VT arrest s/p cardioversion x 1 by EMS, unclear if patient was pulseless or had pulse. Intubated at [facility] and transferred to [facility]  SARS-CoV2 Negative 03/06/2019  Past Medical History  COPD, DM2, CAD, MI, s/p CABG , Systolic heart failure ICM HTN, HLD, EtOH abuse, FTT, PVD , Ureter Cancer s/p L nephrectomy  CKD III   Significant Hospital Events   7/25 s/p VT arrest with ROSC after 1x cardioversion 120J. Intubated at [facility], CVC placed. Transferring to [facility] for ICU admission   7/26: Seen by vascular surgery for ischemic right limb felt that the extremity was not salvageable and recommended amputation.  7/28: Having episodes of desaturation during weaning efforts.  Vasoactive drips weaned off.  Being treated for possible infiltrate in right lower lobe.  7/29: Desaturated down to the 60s requiring bag valve mask ventilation, associated hypotension requiring escalation of vasoactive drips.  Arterial blood gas obtained showing pH of 7.16, PCO2 of 58, saturations 155 and bicarbonate 22, bicarbonate drip initiated  7/30: Vasopressin discontinued, now on just norepinephrine at decreasing dose.  Still having trouble weaning pressors.  Did get 1 dose of Lasix overnight.  Seen by vascular surgery planning for above-the-knee amputation  7/31  - slight improvement in renal function. 80% fio2, peep 12.levophed 7. ,diprivan gtt an fent gtt. More awake. R AKA later today  8/1-  s/p R AKA yesterday due to ischemic right lower extremity. Back on heparin gtt. Renal function better. CK/Lactate normal on diprivan gtt. Also on fentt gtt, levophed gtt, bic gtt. On TF. Vent at 60%, peep 8. LFT better  8/2- -VVS pleased with wound healing of R AKA. On fent gtt,  diprivan gtt and levophed gtt. Low dose bic gtt.+ Heparing gtt +  50% fio2 on vent/peep 8. AKI better - creat 1.5. Now +13.5L positive  Consults:  Vascular surgery  Cardiology   Procedures:  7/25 ETT>> 7/25 CVC >>   Significant Diagnostic Tests:  CXR 7/25> ETT and CVC in adequate position. No pneumothorax. Cardiomegaly. Bilateral pulmonary infiltrates vs edema   CT Head 7/25 > NAICA CT Abdomen/Pelvis 7/25 - Bilateral pleural effusions, bibasilar airspace opacities, cardiomegaly, anasarca, absent left kidney  Micro Data:  7/25 SARS CoV2 > negative  7/26 BCX > 7/26 RCX > Normal flora  Antimicrobials:  7/25 Rocephin>> 04/06/2019   Interim history/subjective:    04/08/2019 - +12.9L . On diprivan, fent gtt, heparin gtt, TF. Off levophed. On sedation when -> tardive movements like chewing in mouth - constantly  Objective   Blood pressure 114/69, pulse 100, temperature (!) 100.4 F (38 C), resp. rate (!) 28, height 6' (1.829 m), weight 102.6 kg, SpO2 95 %.    Vent Mode: PRVC FiO2 (%):  [40 %-60 %] 40 % Set Rate:  [20 bmp] 20 bmp Vt Set:  [510 mL] 510 mL PEEP:  [5 cmH20-8 cmH20] 8 cmH20 Pressure Support:  [10 cmH20] 10 cmH20 Plateau Pressure:  [16 cmH20-19 cmH20] 18 cmH20   Intake/Output Summary (Last 24 hours) at 04/08/2019 1109 Last data filed at 04/08/2019 1100 Gross per 24 hour  Intake 2995.25 ml  Output 4210 ml  Net -1214.75 ml   Filed Weights   04/04/19 0319 04/06/19 0335 04/08/19 0500  Weight: 105.7 kg 100.3 kg 102.6 kg    General Appearance:  Looks criticall ill . Volume overloa d+ Head:  Normocephalic, without obvious abnormality, atraumatic Eyes:  PERRL -   +, conjunctiva/corneas - muddy     Ears:  Normal external ear canals, both ears Nose:  G tube - no Throat:  ETT TUBE - yes , OG tube - yes Neck:  Supple,  No enlargement/tenderness/nodules Lungs: Clear to auscultation bilaterally, Ventilator   Synchrony - yes Heart:  S1 and S2 normal, no murmur, CVP - no.   Pressors - off Abdomen:  Soft, no masses, no organomegaly Genitalia / Rectal:  Not done Extremities:  Extremities- bilateral AKA Skin:  ntact in exposed areas . Sacral area - not examined Neurologic:  Sedation - diprivan and fent gtt -> RASS - +1  . Moves all 4s - tardive movement on face. CAM-ICU - no . Orientation - no         Resolved Hospital Problem list     Assessment & Plan:   ASSESSMENT / PLAN:    Acute resp failuire due to cardiac arrest and multiple issues   04/08/2019 - > does not meet criteria for SBT/Extubation in setting of Acute Respiratory Failure due to encephalopathy (shock and hypoxemia btter)     P:   PRVC - full support   On cymbalta, seroquie, halcion,  and neurontin at home Acute encephalopathy - post arrest + . Head CT 7/26 - negative  04/08/2019 - encephalopathic + - tardive movements of face despite fent gtt and diprivan gtt  P:   Diprivan gtt - Fent gtt Neuro consult - Dr Mike Craze called Monitor      Criculatory shock - some of this is diprivan mediated possibly and also cardiogenic Also, PVD with RLE acute ischemia  /- sp RKAA  04/08/2019 - off pressors  P:  Dc a line DC Levophed gtt off MAR -  MAP goal > 65% Heparin gtt   Combined acute on chronic systolic and diastolic heart failure - baseline ef 45% Coronary artery disease, and hypertension. - Post arrest ef - 25%   04/08/2019 - down to +12.9 L   P: Per cardiology Increase lasix    S/p VT arrest with cardioversion, presumed pulseless VT,  04/08/2019 - on oral amio for A fib  P: Per cardioogy    No evidence of infection P:   [place]   A:  CKD III - baseline creat 1.3 (Hx Ureter cancer s/p L nephrectomy andL abdominal protuberance  AKI at admit post arrest  04/08/2019 - improving AKI - back to baseline 1.3mg %  P:  Monitor avodi nephrotoxins    Anemia critical illness   P:  - PRBC for hgb </= 6.9gm%    - exceptions are   -  if ACS  susepcted/confirmed then transfuse for hgb </= 8.0gm%,  or    -  active bleeding with hemodynamic instability, then transfuse regardless of hemoglobin value   At at all times try to transfuse 1 unit prbc as possible with exception of active hemorrhage       At risk hyperglyceia  - DM2 P:   ssi  Mid line sacral wound P monitor  Rt AKA 03/29/2019  - 04/08/2019 - healthy wound  Plan VVS  Low Mag  Plan  - replete   Best practice:  Diet: NPO  Pain/Anxiety/Delirium protocol (if indicated): Fent, diprivan VAP protocol (if indicated): yes  DVT prophylaxis: SCD  GI prophylaxis: Pepcid Glucose  control: SSI Mobility: BR  Code Status: Full   Family Communication:  calle wife  11:42 AM 04/08/2019 over telpehone - but no answer   Disposition: ICU   ATTESTATION & SIGNATURE   The patient TRINITY HYLAND is critically ill with multiple organ systems failure and requires high complexity decision making for assessment and support, frequent evaluation and titration of therapies, application of advanced monitoring technologies and extensive interpretation of multiple databases.   Critical Care Time devoted to patient care services described in this note is  30  Minutes. This time reflects time of care of this signee Dr Brand Males. This critical care time does not reflect procedure time, or teaching time or supervisory time of PA/NP/Med student/Med Resident etc but could involve care discussion time     Dr. Brand Males, M.D., [facility].C.P Pulmonary and Critical Care Medicine Staff Physician [facility] Pager: [phone], If no answer or between  15:00h - 7:00h: call [phone]  04/08/2019 11:09 AM     LABS    PULMONARY Recent Labs  Lab 04/03/19 2217 04/03/19 2316 04/04/19 0050 04/04/19 1034 04/06/19 0936  PHART 7.157* 7.223*  --  7.327* 7.414  PCO2ART 59.0* 52.4*  --  42.5 48.6*  PO2ART 155.0* 170.0*  --  46.0*  94.5  HCO3 20.7 21.5  --  22.3 30.5*  TCO2 22 23  --  24  --   O2SAT 99.0 99.0 77.8 78.0 97.3    CBC Recent Labs  Lab 04/06/19 0332 04/07/19 0434 04/08/19 0452  HGB 10.9* 10.0* 10.3*  HCT 33.7* 30.8* 31.2*  WBC 6.1 5.6 5.8  PLT 157 152 156    COAGULATION Recent Labs  Lab 04/04/19 0209 03/10/2019 0448 04/06/19 0332 04/07/19 0434 04/08/19 0452  INR 1.5* 1.7* 1.7* 1.4* 1.3*    CARDIAC  No results for input(s): TROPONINI in the last 168 hours. No results for input(s): PROBNP in the last 168 hours.   CHEMISTRY Recent Labs  Lab 04/02/19 0413  04/03/19 0358 04/03/19 1700  04/03/19 2306  04/04/19 0209 04/04/19 1034 04/04/2019 0448 04/06/19 0332 04/07/19 0150 04/07/19 0434 04/08/19 0452  NA 137  --  137  --    < > 139   < > 139 141 143 142 141  --   --   K 4.0  --  4.3  --    < > 4.2   < > 3.8 3.7 3.5 3.8 3.4*  --   --   CL 104  --  107  --   --  104  --  106  --  106 103 103  --   --   CO2 20*  --  20*  --   --  20*  --  20*  --  27 30 30   --   --   GLUCOSE 95  --  150*  --   --  142*  --  177*  --  197* 202* 179*  --   --   BUN 44*  --  50*  --   --  63*  --  64*  --  60* 50* 48*  --   --   CREATININE 2.48*  --  2.47*  --   --  2.70*  --  2.76*  --  2.09* 1.59* 1.36*  --   --   CALCIUM 7.8*  --  7.9*  --   --  7.9*  --  8.1*  --  7.7* 7.5* 7.9*  --   --   MG 1.8  --   --   --   --   --   --  2.1  --   --  1.6*  --  2.1 1.7  PHOS  --    < > 5.0* 6.0*  --   --   --   --   --   --  2.5  --  1.8* 2.5   < > = values in this interval not displayed.   Estimated Creatinine Clearance: 57.3 mL/min (A) (by C-G formula based on SCr of 1.36 mg/dL (H)).   LIVER Recent Labs  Lab 04/03/19 2306 04/04/19 0209 04/03/2019 0448 04/06/19 0332 04/07/19 0434 04/08/19 0452  AST 948* 825*  --  49*  --  95*  ALT 448* 439*  --  119*  --  99*  ALKPHOS 214* 195*  --  87  --  95  BILITOT 0.3 0.6  --  0.3  --  0.6  PROT 5.5* 5.3*  --  4.4*  --  4.7*  ALBUMIN 2.3* 2.3*  --  1.6*  --   1.5*  INR  --  1.5* 1.7* 1.7* 1.4* 1.3*     INFECTIOUS Recent Labs  Lab 04/04/19 0209 04/06/19 0332 04/08/19 0032  LATICACIDVEN 1.6 1.7 1.1     ENDOCRINE CBG (last 3)  Recent Labs    04/08/19 0043 04/08/19 0455 04/08/19 0737  GLUCAP 164* 179* 153*         IMAGING x48h  - image(s) personally visualized  -   highlighted in bold Dg Chest Port 1 View  Result Date: 04/08/2019 CLINICAL DATA:  Intubation.  Respiratory failure. EXAM: PORTABLE CHEST 1 VIEW COMPARISON:  Portable chest 04/07/2019. FINDINGS: Endotracheal tube and NG tube in stable position. Prior CABG. Cardiomegaly. Diffuse bilateral pulmonary infiltrates/edema bilateral pleural effusions again noted. No pneumothorax. IMPRESSION: 1.  Lines and tubes in stable position. 2. Prior CABG. Cardiomegaly with diffuse bilateral pulmonary infiltrates/edema. Bibasilar atelectasis. Small bilateral pleural effusions similar findings noted on prior exam. Electronically Signed   By: Marcello Moores  Register   On: 04/08/2019 07:16   Dg Chest Port 1 View  Result Date: 04/07/2019 CLINICAL DATA:  78 year old male central line placement. Recent right lower extremity amputation. VT arrest with resuscitation 03/28/2019. EXAM: PORTABLE CHEST 1 VIEW COMPARISON:  0547 hours today. FINDINGS: Portable AP semi upright view at 0821 hours. Right IJ approach central line appears stable from earlier. ET tube and visible enteric tube also stable. No pneumothorax.  Unchanged ventilation. IMPRESSION: 1. Stable lines and tubes, including right IJ approach central line. 2. No pneumothorax and stable ventilation since 0547 hours. Electronically Signed   By: Genevie Ann M.D.   On: 04/07/2019 10:02   Dg Chest Port 1 View  Result Date: 04/07/2019 CLINICAL DATA:  78 year old male status post right lower extremity above the knee amputation postoperative day 2. Intubated. EXAM: PORTABLE CHEST 1 VIEW COMPARISON:  04/06/2019 and earlier. FINDINGS: Portable AP semi upright view at  0547 hours. Stable ET tube. Stable visible enteric tube. Stable right IJ central line. Stable lung volumes and ventilation with right greater than left veiling bibasilar opacity and dense retrocardiac opacity. Previous sternotomy. Stable cardiac size and mediastinal contours. No pneumothorax. Pulmonary vascularity appears within normal limits. IMPRESSION: 1.  Stable lines and tubes. 2. Stable ventilation: Right greater than left pleural effusions and bilateral lower lobe collapse or consolidation. Electronically Signed   By: Lemmie Evens  [place] Crane M.D.   On: 04/07/2019 09:57

## 2019-04-08 NOTE — Progress Notes (Signed)
   VASCULAR SURGERY ASSESSMENT & PLAN:   3 Days Post-Op s/p: Right AKA  The wound is healing nicely.  Begin daily dressing changes.    SUBJECTIVE:   Sedated on vent.  PHYSICAL EXAM:   Vitals:   04/08/19 0600 04/08/19 0700 04/08/19 0800 04/08/19 0900  BP: 105/82 120/66 112/69 114/65  Pulse:  98 (!) 104 99  Resp:   (!) 28   Temp: 99.9 F (37.7 C) 99.7 F (37.6 C) 99.9 F (37.7 C) 99.9 F (37.7 C)  TempSrc:      SpO2:  96% 96% 93%  Weight:      Height:       Right AKA looks fine.  LABS:   Lab Results  Component Value Date   WBC 5.8 04/08/2019   HGB 10.3 (L) 04/08/2019   HCT 31.2 (L) 04/08/2019   MCV 87.4 04/08/2019   PLT 156 04/08/2019   Lab Results  Component Value Date   CREATININE 1.36 (H) 04/07/2019   Lab Results  Component Value Date   INR 1.3 (H) 04/08/2019   CBG (last 3)  Recent Labs    04/08/19 0043 04/08/19 0455 04/08/19 0737  GLUCAP 164* 179* 153*    PROBLEM LIST:    Principal Problem:   Cardiac arrest (HCC) Active Problems:   ACC/AHA stage B congestive heart failure due to ischemic cardiomyopathy (HCC)   Peripheral vascular disease (HCC)   Acute respiratory failure (HCC)   Coronary artery disease involving native coronary artery of native heart with angina pectoris (HCC)   Diabetes mellitus with peripheral vascular disease (HCC)   Tobacco abuse   Ventricular tachycardia (HCC)   Acute on chronic combined systolic and diastolic CHF (congestive heart failure) (HCC)   Demand ischemia (HCC)   Critical lower limb ischemia   PVCs (premature ventricular contractions)   Hypoxia   Cardiogenic shock (HCC)   CURRENT MEDS:   . aspirin  81 mg Per Tube Daily  . chlorhexidine gluconate (MEDLINE KIT)  15 mL Mouth Rinse BID  . Chlorhexidine Gluconate Cloth  6 each Topical Daily  . famotidine  20 mg Per Tube QHS  . feeding supplement (PRO-STAT SUGAR FREE 64)  30 mL Per Tube BID  . furosemide  40 mg Intravenous Q8H  . insulin aspart  0-15  Units Subcutaneous Q4H  . ipratropium-albuterol  3 mL Nebulization Q4H  . mouth rinse  15 mL Mouth Rinse 10 times per day    [name] Beeper: [phone] Office: [phone] 04/08/2019

## 2019-04-08 NOTE — Progress Notes (Signed)
Pharmacy Antibiotic Note  [name] is a 78 y.o. male admitted on 03/20/2019 post arrest.  Pharmacy has been consulted for vancomycin and zosyn dosing for possible sepsis.  Patient with fever to 101.1 this afternoon, wbc normal. LA 1.1. Will start broad antibiotics for possible HCAP vs sepsis.   Vancomycin 1250 mg IV Q 24 hrs. Goal AUC 400-550. Expected AUC:  SCr used: 1.3  Plan: Vancomycin 2000mg  x1 then 1250 IV every 24 hours.   Zosyn 3.375g IV q8h (4 hour infusion).  Height: 6' (182.9 cm) Weight: 226 lb 3.1 oz (102.6 kg) IBW/kg (Calculated) : 77.6  Temp (24hrs), Avg:100.2 F (37.9 C), Min:99.7 F (37.6 C), Max:100.9 F (38.3 C)  Recent Labs  Lab 04/03/19 2306 04/04/19 0209 03/27/2019 0448 04/06/19 0332 04/07/19 0150 04/07/19 0434 04/08/19 0032 04/08/19 0452  WBC 8.3 9.4 7.1 6.1  --  5.6  --  5.8  CREATININE 2.70* 2.76* 2.09* 1.59* 1.36*  --   --   --   LATICACIDVEN 2.2* 1.6  --  1.7  --   --  1.1  --     Estimated Creatinine Clearance: 57.3 mL/min (A) (by C-G formula based on SCr of 1.36 mg/dL (H)).    Allergies  Allergen Reactions  . Erythromycin Anaphylaxis and Rash  . Iohexol Anaphylaxis, Hives, Itching and Other (See Comments)    Note:  13 HR PRE-MED GIVEN AND SUCCESSFUL-ARS 07/15/07.     Marland Kitchen Penicillins Anaphylaxis, Itching and Other (See Comments)    Has patient had a PCN reaction causing immediate rash, facial/tongue/throat swelling, SOB or lightheadedness with hypotension: Yes Has patient had a PCN reaction causing severe rash involving mucus membranes or skin necrosis: No Has patient had a PCN reaction that required hospitalization No Has patient had a PCN reaction occurring within the last 10 years: No If all of the above answers are "NO", then may proceed with Cephalosporin use.   Thank you for allowing pharmacy to be a part of this patient's care.  Erin Hearing PharmD., BCPS Clinical Pharmacist 04/08/2019 8:42 PM

## 2019-04-08 NOTE — Progress Notes (Addendum)
Nutrition Follow-up  DOCUMENTATION CODES:   Not applicable  INTERVENTION:   Continue tube feeding:  -Vital AF 1.2 @ 60 ml/hr -30 ml Prostat BID  Provides: 1928 kcals (2342 kcal with propofol), 138 grams protein, 1167 ml free water. Meets 106% of kcal needs and 100% of protein needs.   NUTRITION DIAGNOSIS:   Inadequate oral intake related to inability to eat as evidenced by NPO status.  Ongoing  GOAL:   Patient will meet greater than or equal to 90% of their needs  Addressed via TF  MONITOR:   Vent status, Labs, Weight trends, I & O's, Skin  REASON FOR ASSESSMENT:   Ventilator    ASSESSMENT:   78 yo M PMH COPD, HTN, CAD with prior MI and s/p CABG 7262, ICM, systolic heart failure, DM2, CKD III (baseline Cr 1.3), hx ureter cancer s/p L nephrectomy, PVD s/p L AKA who presented to [facility] ED 7/25 s/p VT with 1x cardioversion and subsequent conversion to narrow complex tachycardic rhythm.   7/31- R AKA  RD working remotely.  Continues on propofol. Off levophed this am. R AKA healing well. Remains fluid positive (12 L)- continues to diurese. Spoke with with RN, pt tolerating TF at goal. Continue current regimen.  Admission weight: 98 kg Current weight: 102.6 kg   Patient remains intubated on ventilator support MV: 14.1 L/min Temp (24hrs), Avg:100.1 F (37.8 C), Min:99.7 F (37.6 C), Max:100.8 F (38.2 C)  Propofol: 15.7 ml/hr- provides 414 kcal daily   I/O: +12,129 ml since admit UOP: 3,210 ml x 24 hrs   Drips: propofol Medications: 40 mg lasix TID, SS novolog,  Labs: CBG 153-179 K 3.4 (L)   Diet Order:   Diet Order            Diet NPO time specified Except for: Sips with Meds  Diet effective midnight              EDUCATION NEEDS:   Not appropriate for education at this time  Skin:  Skin Assessment: Skin Integrity Issues: Skin Integrity Issues:: Stage II, Incisions Stage II: coccyx Incisions: R leg  Last BM:  7/28  Height:   Ht  Readings from Last 1 Encounters:  03/31/19 6' (1.829 m)    Weight:   Wt Readings from Last 1 Encounters:  04/08/19 102.6 kg    Ideal Body Weight:  67.9 kg(adjusted for bilateral AKA)  BMI:  Body mass index is 30.68 kg/m.  Estimated Nutritional Needs:   Kcal:  2218 kcal  Protein:  135-155 grams  Fluid:  >/= 2 L/day   Mariana Single RD, LDN Clinical Nutrition Pager # - [phone]

## 2019-04-08 NOTE — Progress Notes (Signed)
Progress Note  Patient Name: [name] Date of Encounter: 04/08/2019  Primary Cardiologist: No primary care provider on file. Dr. Ellyn Hack  Subjective   Intubated  Inpatient Medications    Scheduled Meds: . aspirin  81 mg Per Tube Daily  . chlorhexidine gluconate (MEDLINE KIT)  15 mL Mouth Rinse BID  . Chlorhexidine Gluconate Cloth  6 each Topical Daily  . dextrose      . famotidine  20 mg Per Tube QHS  . feeding supplement (PRO-STAT SUGAR FREE 64)  30 mL Per Tube BID  . furosemide  80 mg Intravenous Q8H  . insulin aspart  0-15 Units Subcutaneous Q4H  . ipratropium-albuterol  3 mL Nebulization Q4H  . mouth rinse  15 mL Mouth Rinse 10 times per day   Continuous Infusions: . sodium chloride 10 mL/hr at 04/08/19 1100  . sodium chloride    . feeding supplement (VITAL AF 1.2 CAL) 60 mL/hr at 04/08/19 1100  . fentaNYL infusion INTRAVENOUS 50 mcg/hr (04/08/19 1100)  . heparin 2,450 Units/hr (04/08/19 1100)  . magnesium sulfate bolus IVPB    . propofol (DIPRIVAN) infusion 25 mcg/kg/min (04/08/19 1100)   PRN Meds: acetaminophen (TYLENOL) oral liquid 160 mg/5 mL, fentaNYL (SUBLIMAZE) injection, fentaNYL (SUBLIMAZE) injection   Vital Signs    Vitals:   04/08/19 1000 04/08/19 1100 04/08/19 1200 04/08/19 1208  BP: 114/63 114/69 119/70   Pulse: 99 100 100 100  Resp:    (!) 27  Temp: 100.2 F (37.9 C) (!) 100.4 F (38 C) (!) 100.8 F (38.2 C)   TempSrc:      SpO2: 94% 95% 94% 95%  Weight:      Height:        Intake/Output Summary (Last 24 hours) at 04/08/2019 1254 Last data filed at 04/08/2019 1100 Gross per 24 hour  Intake 2862.79 ml  Output 4110 ml  Net -1247.21 ml   Last 3 Weights 04/08/2019 04/06/2019 04/04/2019  Weight (lbs) 226 lb 3.1 oz 221 lb 1.9 oz 233 lb 0.4 oz  Weight (kg) 102.6 kg 100.3 kg 105.7 kg      Telemetry    Sinus rhythm - Personally Reviewed  ECG    NSR, no ST changes - Personally Reviewed  Physical Exam   GEN: intubated, sedated;  tardive dyskinesia movements in face and mouth noted  Neck: No JVD Cardiac: RRR, no murmurs, rubs, or gallops.  Respiratory: Clear to auscultation bilaterally. GI: Soft, nontender, non-distended  MS: Left arm edema; Neuro:  Intubated Psych: Intubated,  Labs    High Sensitivity Troponin:   Recent Labs  Lab 03/31/19 0224 03/31/19 1037 03/31/19 1720 03/31/19 2300 04/03/19 2306  TROPONINIHS 79* 86* 89* 88* 128*      Cardiac EnzymesNo results for input(s): TROPONINI in the last 168 hours. No results for input(s): TROPIPOC in the last 168 hours.   Chemistry Recent Labs  Lab 04/04/19 0209  03/11/2019 0448 04/06/19 0332 04/07/19 0150 04/08/19 0452  NA 139   < > 143 142 141  --   K 3.8   < > 3.5 3.8 3.4*  --   CL 106  --  106 103 103  --   CO2 20*  --  _0 --   GLUCOSE 177*  --  197* 202* 179*  --   BUN 64*  --  60* 50* 48*  --   CREATININE 2.76*  --  2.09* 1.59* 1.36*  --   CALCIUM 8.1*  --  7.7* 7.5* 7.9*  --   PROT 5.3*  --   --  4.4*  --  4.7*  ALBUMIN 2.3*  --   --  1.6*  --  1.5*  AST 825*  --   --  49*  --  95*  ALT 439*  --   --  119*  --  99*  ALKPHOS 195*  --   --  87  --  95  BILITOT 0.6  --   --  0.3  --  0.6  GFRNONAA 21*  --  30* 42* 50*  --   GFRAA 25*  --  35* 48* 58*  --   ANIONGAP 13  --  _0 --    < > = values in this interval not displayed.     Hematology Recent Labs  Lab 04/06/19 0332 04/07/19 0434 04/08/19 0452  WBC 6.1 5.6 5.8  RBC 3.76* 3.46* 3.57*  HGB 10.9* 10.0* 10.3*  HCT 33.7* 30.8* 31.2*  MCV 89.6 89.0 87.4  MCH 29.0 28.9 28.9  MCHC 32.3 32.5 33.0  RDW 17.0* 17.1* 16.6*  PLT 157 152 156    BNP Recent Labs  Lab 03/12/2019 1010  BNP 969.4*     DDimer No results for input(s): DDIMER in the last 168 hours.   Radiology    Dg Chest Port 1 View  Result Date: 04/08/2019 CLINICAL DATA:  Intubation.  Respiratory failure. EXAM: PORTABLE CHEST 1 VIEW COMPARISON:  Portable chest 04/07/2019. FINDINGS: Endotracheal tube and  NG tube in stable position. Prior CABG. Cardiomegaly. Diffuse bilateral pulmonary infiltrates/edema bilateral pleural effusions again noted. No pneumothorax. IMPRESSION: 1.  Lines and tubes in stable position. 2. Prior CABG. Cardiomegaly with diffuse bilateral pulmonary infiltrates/edema. Bibasilar atelectasis. Small bilateral pleural effusions similar findings noted on prior exam. Electronically Signed   By: Marcello Moores  Register   On: 04/08/2019 07:16   Dg Chest Port 1 View  Result Date: 04/07/2019 CLINICAL DATA:  78 year old male central line placement. Recent right lower extremity amputation. VT arrest with resuscitation 03/12/2019. EXAM: PORTABLE CHEST 1 VIEW COMPARISON:  0547 hours today. FINDINGS: Portable AP semi upright view at 0821 hours. Right IJ approach central line appears stable from earlier. ET tube and visible enteric tube also stable. No pneumothorax.  Unchanged ventilation. IMPRESSION: 1. Stable lines and tubes, including right IJ approach central line. 2. No pneumothorax and stable ventilation since 0547 hours. Electronically Signed   By: Genevie Ann M.D.   On: 04/07/2019 10:02   Dg Chest Port 1 View  Result Date: 04/07/2019 CLINICAL DATA:  78 year old male status post right lower extremity above the knee amputation postoperative day 2. Intubated. EXAM: PORTABLE CHEST 1 VIEW COMPARISON:  04/06/2019 and earlier. FINDINGS: Portable AP semi upright view at 0547 hours. Stable ET tube. Stable visible enteric tube. Stable right IJ central line. Stable lung volumes and ventilation with right greater than left veiling bibasilar opacity and dense retrocardiac opacity. Previous sternotomy. Stable cardiac size and mediastinal contours. No pneumothorax. Pulmonary vascularity appears within normal limits. IMPRESSION: 1.  Stable lines and tubes. 2. Stable ventilation: Right greater than left pleural effusions and bilateral lower lobe collapse or consolidation. Electronically Signed   By: Genevie Ann M.D.   On:  04/07/2019 09:57    Cardiac Studies     Patient Profile     78 y.o. male with cardiac arrest  Assessment & Plan    Continue Lasix for volume overload.  No further cardiac w/u planned.  WOuld only consider cath if there was significant neuro recovery noted after extubation.  Cath would be needed before consideration of AICD.       For questions or updates, please contact [place] Please consult [email or web address] for contact info under        Signed, Larae Grooms, MD  04/08/2019, 12:54 PM

## 2019-04-08 NOTE — Plan of Care (Signed)
  Problem: Education: Goal: Ability to manage disease process will improve Outcome: Progressing Goal: Individualized Educational Video(s) Outcome: Progressing   Problem: Cardiac: Goal: Ability to achieve and maintain adequate cardiopulmonary perfusion will improve Outcome: Progressing   Problem: Education: Goal: Knowledge of General Education information will improve Description: Including pain rating scale, medication(s)/side effects and non-pharmacologic comfort measures Outcome: Progressing   Problem: Health Behavior/Discharge Planning: Goal: Ability to manage health-related needs will improve Outcome: Progressing   Problem: Clinical Measurements: Goal: Ability to maintain clinical measurements within normal limits will improve Outcome: Progressing Goal: Will remain free from infection Outcome: Progressing Goal: Diagnostic test results will improve Outcome: Progressing Goal: Respiratory complications will improve Outcome: Progressing Goal: Cardiovascular complication will be avoided Outcome: Progressing   Problem: Activity: Goal: Risk for activity intolerance will decrease Outcome: Progressing   Problem: Nutrition: Goal: Adequate nutrition will be maintained Outcome: Progressing   Problem: Coping: Goal: Level of anxiety will decrease Outcome: Progressing   Problem: Elimination: Goal: Will not experience complications related to bowel motility Outcome: Progressing Goal: Will not experience complications related to urinary retention Outcome: Progressing   Problem: Pain Managment: Goal: General experience of comfort will improve Outcome: Progressing   Problem: Safety: Goal: Ability to remain free from injury will improve Outcome: Progressing   Problem: Skin Integrity: Goal: Risk for impaired skin integrity will decrease Outcome: Progressing   Problem: Activity: Goal: Ability to tolerate increased activity will improve Outcome: Progressing   Problem:  Respiratory: Goal: Ability to maintain a clear airway and adequate ventilation will improve Outcome: Progressing   Problem: Role Relationship: Goal: Method of communication will improve Outcome: Progressing

## 2019-04-09 ENCOUNTER — Inpatient Hospital Stay (HOSPITAL_COMMUNITY): Payer: Medicare HMO

## 2019-04-09 DIAGNOSIS — G9340 Encephalopathy, unspecified: Secondary | ICD-10-CM

## 2019-04-09 LAB — CBC
HCT: 31.4 % — ABNORMAL LOW (ref 39.0–52.0)
Hemoglobin: 10.4 g/dL — ABNORMAL LOW (ref 13.0–17.0)
MCH: 28.7 pg (ref 26.0–34.0)
MCHC: 33.1 g/dL (ref 30.0–36.0)
MCV: 86.7 fL (ref 80.0–100.0)
Platelets: 162 10*3/uL (ref 150–400)
RBC: 3.62 MIL/uL — ABNORMAL LOW (ref 4.22–5.81)
RDW: 16.5 % — ABNORMAL HIGH (ref 11.5–15.5)
WBC: 6 10*3/uL (ref 4.0–10.5)
nRBC: 0 % (ref 0.0–0.2)

## 2019-04-09 LAB — BASIC METABOLIC PANEL
Anion gap: 14 (ref 5–15)
BUN: 53 mg/dL — ABNORMAL HIGH (ref 8–23)
CO2: 30 mmol/L (ref 22–32)
Calcium: 8.6 mg/dL — ABNORMAL LOW (ref 8.9–10.3)
Chloride: 98 mmol/L (ref 98–111)
Creatinine, Ser: 1.18 mg/dL (ref 0.61–1.24)
GFR calc Af Amer: >60 mL/min (ref >60)
GFR calc non Af Amer: 60 mL/min — ABNORMAL LOW (ref >60)
Glucose, Bld: 169 mg/dL — ABNORMAL HIGH (ref 70–99)
Potassium: 4.4 mmol/L (ref 3.5–5.1)
Sodium: 142 mmol/L (ref 135–145)

## 2019-04-09 LAB — GLUCOSE, CAPILLARY
Glucose-Capillary: 105 mg/dL — ABNORMAL HIGH (ref 70–99)
Glucose-Capillary: 111 mg/dL — ABNORMAL HIGH (ref 70–99)
Glucose-Capillary: 133 mg/dL — ABNORMAL HIGH (ref 70–99)
Glucose-Capillary: 158 mg/dL — ABNORMAL HIGH (ref 70–99)
Glucose-Capillary: 165 mg/dL — ABNORMAL HIGH (ref 70–99)
Glucose-Capillary: 172 mg/dL — ABNORMAL HIGH (ref 70–99)

## 2019-04-09 LAB — PROCALCITONIN: Procalcitonin: 0.68 ng/mL

## 2019-04-09 LAB — HEPARIN LEVEL (UNFRACTIONATED): Heparin Unfractionated: 0.5 IU/mL (ref 0.30–0.70)

## 2019-04-09 LAB — PROTIME-INR
INR: 1.2 (ref 0.8–1.2)
Prothrombin Time: 14.9 seconds (ref 11.4–15.2)

## 2019-04-09 LAB — PHOSPHORUS: Phosphorus: 3 mg/dL (ref 2.5–4.6)

## 2019-04-09 LAB — MAGNESIUM: Magnesium: 1.7 mg/dL (ref 1.7–2.4)

## 2019-04-09 MED ORDER — FUROSEMIDE 10 MG/ML IJ SOLN
40.0000 mg | Freq: Four times a day (QID) | INTRAMUSCULAR | Status: AC
  Administered 2019-04-09 (×3): 40 mg via INTRAVENOUS
  Filled 2019-04-09 (×3): qty 4

## 2019-04-09 MED ORDER — MAGNESIUM SULFATE 2 GM/50ML IV SOLN
2.0000 g | Freq: Once | INTRAVENOUS | Status: AC
  Administered 2019-04-09: 2 g via INTRAVENOUS
  Filled 2019-04-09: qty 50

## 2019-04-09 NOTE — Progress Notes (Signed)
RT in transport with another pt, RN called RT to notify that CCM had flipped pt from full support to CPAP/PS 5/5.

## 2019-04-09 NOTE — Progress Notes (Signed)
Pt transported on vent from 2H14 to CT and back with no complications.

## 2019-04-09 NOTE — Progress Notes (Addendum)
NAME:  GRISELDA TOSH, MRN:  [id number], DOB:  04/19/1941, LOS: 43 ADMISSION DATE:  03/22/2019, CONSULTATION DATE:  03/28/2019 REFERRING MD:  Forestine Na EM (Dr. Sabra Heck), CHIEF COMPLAINT:  S/p VT and 1x cardioversion  Brief History   78 yo M s/p VT arrest s/p cardioversion x 1 by EMS, unclear if patient was pulseless or had pulse. Intubated at [facility] and transferred to [facility]  SARS-CoV2 Negative 03/15/2019  Past Medical History  COPD, DM2, CAD, MI, s/p CABG , Systolic heart failure ICM HTN, HLD, EtOH abuse, FTT, PVD , Ureter Cancer s/p L nephrectomy  CKD III   Significant Hospital Events   7/25 s/p VT arrest with ROSC after 1x cardioversion 120J. Intubated at [facility], CVC placed. Transferring to [facility] for ICU admission   7/26: Seen by vascular surgery for ischemic right limb felt that the extremity was not salvageable and recommended amputation.  7/28: Having episodes of desaturation during weaning efforts.  Vasoactive drips weaned off.  Being treated for possible infiltrate in right lower lobe.  7/29: Desaturated down to the 60s requiring bag valve mask ventilation, associated hypotension requiring escalation of vasoactive drips.  Arterial blood gas obtained showing pH of 7.16, PCO2 of 58, saturations 155 and bicarbonate 22, bicarbonate drip initiated  7/30: Vasopressin discontinued, now on just norepinephrine at decreasing dose.  Still having trouble weaning pressors.  Did get 1 dose of Lasix overnight.  Seen by vascular surgery planning for above-the-knee amputation  7/31  - slight improvement in renal function. 80% fio2, peep 12.levophed 7. ,diprivan gtt an fent gtt. More awake. R AKA later today  8/1-  s/p R AKA yesterday due to ischemic right lower extremity. Back on heparin gtt. Renal function better. CK/Lactate normal on diprivan gtt. Also on fentt gtt, levophed gtt, bic gtt. On TF. Vent at 60%, peep 8. LFT better  8/2- -VVS pleased with wound healing of R AKA. On fent gtt,  diprivan gtt and levophed gtt. Low dose bic gtt.+ Heparing gtt +  50% fio2 on vent/peep 8. AKI better - creat 1.5. Now +13.5L positive  Consults:  Vascular surgery  Cardiology   Procedures:  7/25 ETT>> 7/25 CVC >>   Significant Diagnostic Tests:  CXR 7/25> ETT and CVC in adequate position. No pneumothorax. Cardiomegaly. Bilateral pulmonary infiltrates vs edema   CT Head 7/25 > NAICA CT Abdomen/Pelvis 7/25 - Bilateral pleural effusions, bibasilar airspace opacities, cardiomegaly, anasarca, absent left kidney  Micro Data:  7/25 SARS CoV2 > negative  7/26 BCX > 7/26 RCX > Normal flora  Antimicrobials:  7/25 Rocephin>> 04/06/2019   Interim history/subjective:    04/09/2019 - +12.9L . On diprivan, fent gtt, heparin gtt, TF. Off levophed. On sedation when -> tardive movements like chewing in mouth - constantly  Objective   Blood pressure (!) 100/50, pulse 97, temperature 99.5 F (37.5 C), resp. rate 20, height 6' (1.829 m), weight 99.5 kg, SpO2 95 %.    Vent Mode: PRVC FiO2 (%):  [40 %] 40 % Set Rate:  [20 bmp] 20 bmp Vt Set:  [510 mL] 510 mL PEEP:  [8 cmH20] 8 cmH20 Plateau Pressure:  [16 cmH20-20 cmH20] 16 cmH20   Intake/Output Summary (Last 24 hours) at 04/09/2019 1017 Last data filed at 04/09/2019 0600 Gross per 24 hour  Intake 3092.74 ml  Output 2550 ml  Net 542.74 ml   Filed Weights   04/06/19 0335 04/08/19 0500 04/09/19 0500  Weight: 100.3 kg 102.6 kg 99.5 kg  General Appearance:  Looks criticall ill . Volume overloa d+ HEENT: [place]/AT, PERRL, EOM-I and MMM, ETT in place Lungs: CTA bilaterally Heart: RRR, Nl S1/S2 and -M/R/G Abdomen:  Soft, NT, ND and +BS Extremities:  1+ edema Skin:  ntact in exposed areas . Sacral area - not examined Neurologic:  Sedation - diprivan and fent gtt -> RASS - +1  . Moves all 4s - tardive movement on face. CAM-ICU - no . Orientation - no  I reviewed CXR myself, ETT is in a good position  Resolved Hospital Problem list      Assessment & Plan:   ASSESSMENT / PLAN:  Acute resp failuire due to cardiac arrest and multiple issues P:   Begin PS trials Decrease PEEP to 5 Titrate O2 for sat of 88-92%  On cymbalta, seroquie, halcion,  and neurontin at home Acute encephalopathy - post arrest + . Head CT 7/26 - negative  04/09/2019 - encephalopathic + - tardive movements of face despite fent gtt and diprivan gtt  P:   Diprivan gtt - Fent gtt Neuro consult - Dr Mike Craze called Monitor D/C sedation to assess mental status   Criculatory shock - some of this is diprivan mediated possibly and also cardiogenic Also, PVD with RLE acute ischemia  /- sp RKAA P:  Dc a line DC Levophed gtt off MAR -  MAP goal > 65% Heparin gtt Tele monitoring  Combined acute on chronic systolic and diastolic heart failure - baseline ef 45% Coronary artery disease, and hypertension. - Post arrest ef - 25% P: Per cardiology Lasix 40 mg IV q6 x3 doses BMET in AM  S/p VT arrest with cardioversion, presumed pulseless VT, P: Per cardioogy Full code Tele monitoring  No evidence of infection P:   [place]  A:  CKD III - baseline creat 1.3 (Hx Ureter cancer s/p L nephrectomy andL abdominal protuberance  AKI at admit post arrest P:  Monitor BMET in AM Avoid nephrotoxic drugs  At risk hyperglyceia  - DM2 P:   SSI  Mid line sacral wound P monitor  Rt AKA 03/16/2019 Plan VVS  Hypomag Plan - Replete - Recheck  PCCM will continue to manage.  Had an extensive conversation with the wife.  Full DNR, no further escalation of care, she is to bring her family in for further discussion regarding plan of care.  Best practice:  Diet: NPO  Pain/Anxiety/Delirium protocol (if indicated): Fent, diprivan VAP protocol (if indicated): yes  DVT prophylaxis: SCD  GI prophylaxis: Pepcid Glucose control: SSI Mobility: BR  Code Status: Full   Family Communication:  calle wife  11:42 AM 04/08/2019 over telpehone - but no  answer  Disposition: ICU  LABS    PULMONARY Recent Labs  Lab 04/03/19 2217 04/03/19 2316 04/04/19 0050 04/04/19 1034 04/06/19 0936  PHART 7.157* 7.223*  --  7.327* 7.414  PCO2ART 59.0* 52.4*  --  42.5 48.6*  PO2ART 155.0* 170.0*  --  46.0* 94.5  HCO3 20.7 21.5  --  22.3 30.5*  TCO2 22 23  --  24  --   O2SAT 99.0 99.0 77.8 78.0 97.3    CBC Recent Labs  Lab 04/07/19 0434 04/08/19 0452 04/09/19 0353  HGB 10.0* 10.3* 10.4*  HCT 30.8* 31.2* 31.4*  WBC 5.6 5.8 6.0  PLT 152 156 162    COAGULATION Recent Labs  Lab 04/04/2019 0448 04/06/19 0332 04/07/19 0434 04/08/19 0452 04/09/19 0353  INR 1.7* 1.7* 1.4* 1.3* 1.2    CARDIAC  No  results for input(s): TROPONINI in the last 168 hours. No results for input(s): PROBNP in the last 168 hours.   CHEMISTRY Recent Labs  Lab 04/03/19 1700  04/04/19 0209 04/04/19 1034 03/26/2019 0448 04/06/19 0332 04/07/19 0150 04/07/19 0434 04/08/19 0452 04/09/19 0353  NA  --    < > 139 141 143 142 141  --   --  142  K  --    < > 3.8 3.7 3.5 3.8 3.4*  --   --  4.4  CL  --    < > 106  --  106 103 103  --   --  98  CO2  --    < > 20*  --  27 30 30   --   --  30  GLUCOSE  --    < > 177*  --  197* 202* 179*  --   --  169*  BUN  --    < > 64*  --  60* 50* 48*  --   --  53*  CREATININE  --    < > 2.76*  --  2.09* 1.59* 1.36*  --   --  1.18  CALCIUM  --    < > 8.1*  --  7.7* 7.5* 7.9*  --   --  8.6*  MG  --   --  2.1  --   --  1.6*  --  2.1 1.7 1.7  PHOS 6.0*  --   --   --   --  2.5  --  1.8* 2.5 3.0   < > = values in this interval not displayed.   Estimated Creatinine Clearance: 65.1 mL/min (by C-G formula based on SCr of 1.18 mg/dL).   LIVER Recent Labs  Lab 04/03/19 2306 04/04/19 0209 03/17/2019 0448 04/06/19 0332 04/07/19 0434 04/08/19 0452 04/09/19 0353  AST 948* 825*  --  49*  --  95*  --   ALT 448* 439*  --  119*  --  99*  --   ALKPHOS 214* 195*  --  87  --  95  --   BILITOT 0.3 0.6  --  0.3  --  0.6  --   PROT 5.5*  5.3*  --  4.4*  --  4.7*  --   ALBUMIN 2.3* 2.3*  --  1.6*  --  1.5*  --   INR  --  1.5* 1.7* 1.7* 1.4* 1.3* 1.2     INFECTIOUS Recent Labs  Lab 04/06/19 0332 04/08/19 0032 04/08/19 1907 04/09/19 0353  LATICACIDVEN 1.7 1.1 1.1  --   PROCALCITON  --   --  0.73 0.68     ENDOCRINE CBG (last 3)  Recent Labs    04/09/19 0012 04/09/19 0353 04/09/19 0800  GLUCAP 172* 165* 133*         IMAGING x48h  - image(s) personally visualized  -   highlighted in bold Ct Head Wo Contrast  Result Date: 04/09/2019 CLINICAL DATA:  Hypoxic encephalopathy EXAM: CT HEAD WITHOUT CONTRAST TECHNIQUE: Contiguous axial images were obtained from the base of the skull through the vertex without intravenous contrast. COMPARISON:  03/31/2019 FINDINGS: Brain: Chronic atrophic changes are identified similar to that seen on prior exam. No findings to suggest acute hemorrhage, acute infarction or space-occupying mass lesion are seen. Vascular: No hyperdense vessel or unexpected calcification. Skull: Normal. Negative for fracture or focal lesion. Sinuses/Orbits: Mucosal thickening is noted within the sphenoid sinus on left. This is new from the prior  exam. The remainder of the paranasal sinuses are within normal limits. Other: None IMPRESSION: Chronic atrophic changes without acute abnormality. Electronically Signed   By: Inez Catalina M.D.   On: 04/09/2019 00:50   Dg Chest Port 1 View  Result Date: 04/09/2019 CLINICAL DATA:  Intubation.  Respiratory failure. EXAM: PORTABLE CHEST 1 VIEW COMPARISON:  04/08/2019.  04/07/2019. FINDINGS: Lower chest incompletely imaged. Endotracheal tube and NG tube appear in stable position. Stable cardiomegaly. Persistent bibasilar atelectasis/infiltrates and small bilateral pleural effusions. No significant interim change. No pneumothorax. IMPRESSION: 1.  Endotracheal tube and NG tube stable position. 2.  Stable cardiomegaly. 3. Persistent bibasilar atelectasis/infiltrates and small  bilateral pleural effusions. No significant interim change. Electronically Signed   By: Marcello Moores  Register   On: 04/09/2019 07:13   Dg Chest Port 1 View  Result Date: 04/08/2019 CLINICAL DATA:  Intubation.  Respiratory failure. EXAM: PORTABLE CHEST 1 VIEW COMPARISON:  Portable chest 04/07/2019. FINDINGS: Endotracheal tube and NG tube in stable position. Prior CABG. Cardiomegaly. Diffuse bilateral pulmonary infiltrates/edema bilateral pleural effusions again noted. No pneumothorax. IMPRESSION: 1.  Lines and tubes in stable position. 2. Prior CABG. Cardiomegaly with diffuse bilateral pulmonary infiltrates/edema. Bibasilar atelectasis. Small bilateral pleural effusions similar findings noted on prior exam. Electronically Signed   By: Marcello Moores  Register   On: 04/08/2019 07:16   The patient is critically ill with multiple organ systems failure and requires high complexity decision making for assessment and support, frequent evaluation and titration of therapies, application of advanced monitoring technologies and extensive interpretation of multiple databases.   Critical Care Time devoted to patient care services described in this note is  32  Minutes. This time reflects time of care of this signee Dr Jennet Maduro. This critical care time does not reflect procedure time, or teaching time or supervisory time of PA/NP/Med student/Med Resident etc but could involve care discussion time.  Rush Farmer, M.D. [facility] Pulmonary/Critical Care Medicine. Pager: [phone]. After hours pager: [phone].

## 2019-04-09 NOTE — Progress Notes (Signed)
ANTICOAGULATION CONSULT NOTE - [place] for heparin Indication: chest pain/ACS  Allergies  Allergen Reactions  . Erythromycin Anaphylaxis and Rash  . Iohexol Anaphylaxis, Hives, Itching and Other (See Comments)    Note:  13 HR PRE-MED GIVEN AND SUCCESSFUL-ARS 07/15/07.     Marland Kitchen Penicillins Anaphylaxis, Itching and Other (See Comments)    Has patient had a PCN reaction causing immediate rash, facial/tongue/throat swelling, SOB or lightheadedness with hypotension: Yes Has patient had a PCN reaction causing severe rash involving mucus membranes or skin necrosis: No Has patient had a PCN reaction that required hospitalization No Has patient had a PCN reaction occurring within the last 10 years: No If all of the above answers are "NO", then may proceed with Cephalosporin use.    Patient Measurements: Height: 6' (182.9 cm) Weight: 219 lb 5.7 oz (99.5 kg) IBW/kg (Calculated) : 77.6 Heparin Dosing Weight: 97.3 kg  Vital Signs: Temp: 99.5 F (37.5 C) (08/04 0600) Temp Source: Bladder (08/04 0400) BP: 111/68 (08/04 0600) Pulse Rate: 100 (08/04 0600)  Labs: Recent Labs    04/07/19 0150  04/07/19 0434 04/07/19 1229 04/08/19 0452 04/09/19 0353  HGB  --    < > 10.0*  --  10.3* 10.4*  HCT  --   --  30.8*  --  31.2* 31.4*  PLT  --   --  152  --  156 162  LABPROT  --   --  16.6*  --  15.6* 14.9  INR  --   --  1.4*  --  1.3* 1.2  HEPARINUNFRC  --    < > 0.15* 0.32 0.31 0.50  CREATININE 1.36*  --   --   --   --  1.18  CKTOTAL  --   --   --  183  --   --   CKMB  --   --   --  2.1  --   --    < > = values in this interval not displayed.    Estimated Creatinine Clearance: 65.1 mL/min (by C-G formula based on SCr of 1.18 mg/dL).   Medical History: Past Medical History:  Diagnosis Date  . Arthritis    "qwhere" (07/09/2013)  . Asthma   . COPD (chronic obstructive pulmonary disease) ([place])    pt denies this hx on 07/09/2013; has chronic dyspnea  . Coronary  artery disease    Reportedly multivessel disease-CABG  . Gout   . H/O hiatal hernia 1997  . H/O: rheumatic fever    "had rheumatic fever as a kid" (07/09/2013) -has murmur  . Hyperlipidemia with target LDL less than 70   . Hypertension   . Ischemic cardiomyopathy    By report,-ICD  . Myocardial infarction ([place]) 03/2002; ~ 2011  . Neuropathy   . PAD (peripheral artery disease) ([place]) 2014   Right SFA stent November 2014; status post left AKA  . Pneumonia    "once" (07/09/2013)  . Renal disorder    "only have 1 kidney; due to go back soon to check the other one" (07/09/2013)  . RUPTURE ROTATOR CUFF 12/03/2009   Qualifier: Diagnosis of  By: Aline Brochure MD, Dorothyann Peng    . Type II diabetes mellitus ([place])   . Ureter cancer ([place]) 12/2001   "shut down my kidney & resulted in nephrectomy" (07/09/2013)  . UTI (urinary tract infection) 10/04/2018    Assessment: 78 yo male with VT and concern for ACS (hx MI and CABG) also with ischemic right foot  now post R AKA.   Heparin level (0.50) remains therapeutic after increasing drip rate to 2450 units/hr. Hgb (10.4) improving and stable. PLT slightly improved at 162. No bleeding or infusion issues per RN.  Plan: Continue heparin at 2450 units/hr Daily heparin level and cbc Monitor signs of bleeding  Richardine Service, PharmD PGY1 Pharmacy Resident Phone: [phone] 04/09/2019  7:49 AM  Please check [email or web address] for unit-specific pharmacy phone numbers.

## 2019-04-09 NOTE — Progress Notes (Signed)
   VASCULAR SURGERY ASSESSMENT & PLAN:   4 Days Post-Op s/p: RIGHT AKA. Healing well.  Continue daily dressing changes.    SUBJECTIVE:   Sedated on vent  PHYSICAL EXAM:   Vitals:   04/09/19 0400 04/09/19 0407 04/09/19 0500 04/09/19 0600  BP: 107/68  107/62 111/68  Pulse: 99 100 (!) 103 100  Resp:  (!) 22    Temp: 99.5 F (37.5 C)  99.9 F (37.7 C) 99.5 F (37.5 C)  TempSrc: Bladder     SpO2: 93% 95% 92% 93%  Weight:   99.5 kg   Height:       Right AKA looks fine.   LABS:   Lab Results  Component Value Date   WBC 6.0 04/09/2019   HGB 10.4 (L) 04/09/2019   HCT 31.4 (L) 04/09/2019   MCV 86.7 04/09/2019   PLT 162 04/09/2019   Lab Results  Component Value Date   CREATININE 1.18 04/09/2019   CBG (last 3)  Recent Labs    04/08/19 2001 04/09/19 0012 04/09/19 0353  GLUCAP 165* 172* 165*    PROBLEM LIST:    Principal Problem:   Cardiac arrest ([place]) Active Problems:   ACC/AHA stage B congestive heart failure due to ischemic cardiomyopathy ([place])   Peripheral vascular disease (HCC)   Acute respiratory failure (HCC)   Coronary artery disease involving native coronary artery of native heart with angina pectoris ([place])   Diabetes mellitus with peripheral vascular disease ([place])   Tobacco abuse   Ventricular tachycardia (HCC)   Acute on chronic combined systolic and diastolic CHF (congestive heart failure) ([place])   Demand ischemia (HCC)   Critical lower limb ischemia   PVCs (premature ventricular contractions)   Hypoxia   Cardiogenic shock (HCC)   CURRENT MEDS:   . aspirin  81 mg Per Tube Daily  . chlorhexidine gluconate (MEDLINE KIT)  15 mL Mouth Rinse BID  . Chlorhexidine Gluconate Cloth  6 each Topical Daily  . famotidine  20 mg Per Tube QHS  . feeding supplement (PRO-STAT SUGAR FREE 64)  30 mL Per Tube BID  . insulin aspart  0-15 Units Subcutaneous Q4H  . ipratropium-albuterol  3 mL Nebulization Q4H  . mouth rinse  15 mL Mouth Rinse 10 times per day     [name] Beeper: [phone] Office: [phone] 04/09/2019

## 2019-04-09 NOTE — Progress Notes (Signed)
Subjective: No significant changes  Exam: Vitals:   04/09/19 1139 04/09/19 1200  BP:  109/64  Pulse:  100  Resp:    Temp:  99.1 F (37.3 C)  SpO2: 92% 92%   Gen: In bed, NAD Resp: non-labored breathing, no acute distress Abd: soft, nt  Neuro: MS: With leg sedation, patient's eyes are open, but he does not clearly engage, he thrashes his head about non-purposefully with little movement in the extremities.  He does not follow commands either appendicular or central CN: Pupils are equal and reactive, corneals are intact Motor: Mild proximal withdrawal to nailbed pressure bilaterally Sensory: As above  Pertinent Labs: Creatinine 1.18  Impression: 78 year old male with likely anoxic injury.  He has had a prolonged hospital stay, so there could be confounding factors given that his CT does not show any changes,  so I would like to get an MRI.    Recommendations: 1) MRI brain 2) neurology will follow  Roland Rack, MD [facility] [phone]  If 7pm- 7am, please page neurology on call as listed in [place].

## 2019-04-10 ENCOUNTER — Inpatient Hospital Stay (HOSPITAL_COMMUNITY): Payer: Medicare HMO

## 2019-04-10 DIAGNOSIS — G931 Anoxic brain damage, not elsewhere classified: Secondary | ICD-10-CM

## 2019-04-10 LAB — GLUCOSE, CAPILLARY
Glucose-Capillary: 129 mg/dL — ABNORMAL HIGH (ref 70–99)
Glucose-Capillary: 139 mg/dL — ABNORMAL HIGH (ref 70–99)
Glucose-Capillary: 153 mg/dL — ABNORMAL HIGH (ref 70–99)
Glucose-Capillary: 158 mg/dL — ABNORMAL HIGH (ref 70–99)
Glucose-Capillary: 170 mg/dL — ABNORMAL HIGH (ref 70–99)
Glucose-Capillary: 179 mg/dL — ABNORMAL HIGH (ref 70–99)
Glucose-Capillary: 188 mg/dL — ABNORMAL HIGH (ref 70–99)

## 2019-04-10 LAB — POCT I-STAT 7, (LYTES, BLD GAS, ICA,H+H)
Acid-Base Excess: 14 mmol/L — ABNORMAL HIGH (ref 0.0–2.0)
Bicarbonate: 39.2 mmol/L — ABNORMAL HIGH (ref 20.0–28.0)
Calcium, Ion: 1.24 mmol/L (ref 1.15–1.40)
HCT: 29 % — ABNORMAL LOW (ref 39.0–52.0)
Hemoglobin: 9.9 g/dL — ABNORMAL LOW (ref 13.0–17.0)
O2 Saturation: 92 %
Potassium: 4 mmol/L (ref 3.5–5.1)
Sodium: 141 mmol/L (ref 135–145)
TCO2: 41 mmol/L — ABNORMAL HIGH (ref 22–32)
pCO2 arterial: 51.3 mmHg — ABNORMAL HIGH (ref 32.0–48.0)
pH, Arterial: 7.491 — ABNORMAL HIGH (ref 7.350–7.450)
pO2, Arterial: 60 mmHg — ABNORMAL LOW (ref 83.0–108.0)

## 2019-04-10 LAB — URINE CULTURE: Culture: NO GROWTH

## 2019-04-10 LAB — PHOSPHORUS: Phosphorus: 4.3 mg/dL (ref 2.5–4.6)

## 2019-04-10 LAB — CBC
HCT: 30.7 % — ABNORMAL LOW (ref 39.0–52.0)
Hemoglobin: 9.8 g/dL — ABNORMAL LOW (ref 13.0–17.0)
MCH: 28.1 pg (ref 26.0–34.0)
MCHC: 31.9 g/dL (ref 30.0–36.0)
MCV: 88 fL (ref 80.0–100.0)
Platelets: 194 10*3/uL (ref 150–400)
RBC: 3.49 MIL/uL — ABNORMAL LOW (ref 4.22–5.81)
RDW: 16.5 % — ABNORMAL HIGH (ref 11.5–15.5)
WBC: 6.9 10*3/uL (ref 4.0–10.5)
nRBC: 0 % (ref 0.0–0.2)

## 2019-04-10 LAB — MAGNESIUM: Magnesium: 1.7 mg/dL (ref 1.7–2.4)

## 2019-04-10 LAB — HEPARIN LEVEL (UNFRACTIONATED): Heparin Unfractionated: 0.66 IU/mL (ref 0.30–0.70)

## 2019-04-10 LAB — BASIC METABOLIC PANEL
Anion gap: 10 (ref 5–15)
BUN: 54 mg/dL — ABNORMAL HIGH (ref 8–23)
CO2: 35 mmol/L — ABNORMAL HIGH (ref 22–32)
Calcium: 8.7 mg/dL — ABNORMAL LOW (ref 8.9–10.3)
Chloride: 98 mmol/L (ref 98–111)
Creatinine, Ser: 1.22 mg/dL (ref 0.61–1.24)
GFR calc Af Amer: >60 mL/min (ref >60)
GFR calc non Af Amer: 57 mL/min — ABNORMAL LOW (ref >60)
Glucose, Bld: 186 mg/dL — ABNORMAL HIGH (ref 70–99)
Potassium: 4.2 mmol/L (ref 3.5–5.1)
Sodium: 143 mmol/L (ref 135–145)

## 2019-04-10 LAB — PROCALCITONIN: Procalcitonin: 0.47 ng/mL

## 2019-04-10 LAB — PROTIME-INR
INR: 1.3 — ABNORMAL HIGH (ref 0.8–1.2)
Prothrombin Time: 15.7 seconds — ABNORMAL HIGH (ref 11.4–15.2)

## 2019-04-10 MED ORDER — MAGNESIUM SULFATE 2 GM/50ML IV SOLN: 2.0000 g | Freq: Once | INTRAVENOUS | Status: DC

## 2019-04-10 MED ORDER — PROPOFOL 1000 MG/100ML IV EMUL: 5.0000 ug/kg/min | INTRAVENOUS | Status: DC

## 2019-04-10 MED ORDER — POTASSIUM CHLORIDE 20 MEQ/15ML (10%) PO SOLN
40.0000 meq | Freq: Three times a day (TID) | ORAL | Status: AC
  Administered 2019-04-10 (×2): 40 meq
  Filled 2019-04-10 (×2): qty 30

## 2019-04-10 MED ORDER — FUROSEMIDE 10 MG/ML IJ SOLN
40.0000 mg | Freq: Four times a day (QID) | INTRAMUSCULAR | Status: AC
  Administered 2019-04-10 (×3): 40 mg via INTRAVENOUS
  Filled 2019-04-10 (×4): qty 4

## 2019-04-10 MED ORDER — DEXMEDETOMIDINE HCL IN NACL 200 MCG/50ML IV SOLN
0.4000 ug/kg/h | INTRAVENOUS | Status: DC
  Administered 2019-04-10: 0.6 ug/kg/h via INTRAVENOUS
  Administered 2019-04-10: 0.4 ug/kg/h via INTRAVENOUS
  Administered 2019-04-10: 0.5 ug/kg/h via INTRAVENOUS
  Administered 2019-04-11: 0.7 ug/kg/h via INTRAVENOUS
  Administered 2019-04-11: 0.5 ug/kg/h via INTRAVENOUS
  Administered 2019-04-11: 0.502 ug/kg/h via INTRAVENOUS
  Filled 2019-04-10 (×7): qty 50

## 2019-04-10 MED ORDER — MAGNESIUM SULFATE 4 GM/100ML IV SOLN
4.0000 g | Freq: Once | INTRAVENOUS | Status: AC
  Administered 2019-04-10: 13:00:00 4 g via INTRAVENOUS
  Filled 2019-04-10: qty 100

## 2019-04-10 NOTE — Progress Notes (Signed)
Subjective: No significant changes  Exam: Vitals:   04/10/19 0752 04/10/19 0800  BP:  96/63  Pulse:  95  Resp:    Temp:  99.3 F (37.4 C)  SpO2: 96% 97%   Gen: In bed, NAD Resp: non-labored breathing, no acute distress Abd: soft, nt  Neuro: [id number] eyes are open, but he does not clearly engage, he thrashes his head about non-purposefully with little movement in the extremities.  He does not clearly follow commands either appendicular or central CN: Pupils are equal and reactive, corneals are intact Motor: Mild proximal withdrawal to nailbed pressure bilaterally Sensory: As above  Pertinent Labs: Creatinine 1.18  Impression: 78 year old male with likely anoxic injury.  He has had a prolonged hospital stay, so there could be confounding factors given that his CT does not show any changes,  so I would like to get an MRI.  Also, [place] EEG did not show seizure, will repeat to get a period with less muscle artifact.   Recommendations: 1) MRI brain pending 2) repeat EEG 3) will follow.   Roland Rack, MD [facility] [phone]  If 7pm- 7am, please page neurology on call as listed in [place].

## 2019-04-10 NOTE — Progress Notes (Signed)
NAME:  ZEREK LITSEY, MRN:  [id number], DOB:  1941/08/11, LOS: 11 ADMISSION DATE:  03/19/2019, CONSULTATION DATE:  03/09/2019 REFERRING MD:  Forestine Na EM (Dr. Sabra Heck), CHIEF COMPLAINT:  S/p VT and 1x cardioversion  Brief History   78 yo M s/p VT arrest s/p cardioversion x 1 by EMS, unclear if patient was pulseless or had pulse. Intubated at [facility] ED and transferred to [facility]  SARS-CoV2 Negative 04/02/2019  Past Medical History  COPD, DM2, CAD, MI, s/p CABG , Systolic heart failure ICM HTN, HLD, EtOH abuse, FTT, PVD , Ureter Cancer s/p L nephrectomy  CKD III   Significant Hospital Events   7/25 s/p VT arrest with ROSC after 1x cardioversion 120J. Intubated at [facility], CVC placed. Transferring to [facility] for ICU admission   7/26: Seen by vascular surgery for ischemic right limb felt that the extremity was not salvageable and recommended amputation.  7/28: Having episodes of desaturation during weaning efforts.  Vasoactive drips weaned off.  Being treated for possible infiltrate in right lower lobe.  7/29: Desaturated down to the 60s requiring bag valve mask ventilation, associated hypotension requiring escalation of vasoactive drips.  Arterial blood gas obtained showing pH of 7.16, PCO2 of 58, saturations 155 and bicarbonate 22, bicarbonate drip initiated  7/30: Vasopressin discontinued, now on just norepinephrine at decreasing dose.  Still having trouble weaning pressors.  Did get 1 dose of Lasix overnight.  Seen by vascular surgery planning for above-the-knee amputation  7/31  - slight improvement in renal function. 80% fio2, peep 12.levophed 7. ,diprivan gtt an fent gtt. More awake. R AKA later today  8/1-  s/p R AKA yesterday due to ischemic right lower extremity. Back on heparin gtt. Renal function better. CK/Lactate normal on diprivan gtt. Also on fentt gtt, levophed gtt, bic gtt. On TF. Vent at 60%, peep 8. LFT better  8/2- -VVS pleased with wound healing of R AKA. On fent gtt,  diprivan gtt and levophed gtt. Low dose bic gtt.+ Heparing gtt +  50% fio2 on vent/peep 8. AKI better - creat 1.5. Now +13.5L positive  Consults:  Vascular surgery  Cardiology   Procedures:  7/25 ETT>> 7/25 CVC >>   Significant Diagnostic Tests:  CXR 7/25> ETT and CVC in adequate position. No pneumothorax. Cardiomegaly. Bilateral pulmonary infiltrates vs edema   CT Head 7/25 > NAICA CT Abdomen/Pelvis 7/25 - Bilateral pleural effusions, bibasilar airspace opacities, cardiomegaly, anasarca, absent left kidney  Micro Data:  7/25 SARS CoV2 > negative  7/26 BCX > 7/26 RCX > Normal flora  Antimicrobials:  7/25 Rocephin>> 04/06/2019   Interim history/subjective:   None purposeful agitation overnight, remains on propofol  Objective   Blood pressure 96/63, pulse 95, temperature 99.3 F (37.4 C), resp. rate 20, height 6' (1.829 m), weight 99.6 kg, SpO2 97 %.    Vent Mode: PRVC FiO2 (%):  [40 %-50 %] 50 % Set Rate:  [20 bmp] 20 bmp Vt Set:  [510 mL] 510 mL PEEP:  [5 cmH20] 5 cmH20 Pressure Support:  [5 cmH20] 5 cmH20 Plateau Pressure:  [14 cmH20-20 cmH20] 14 cmH20   Intake/Output Summary (Last 24 hours) at 04/10/2019 0901 Last data filed at 04/10/2019 0849 Gross per 24 hour  Intake 2462.46 ml  Output 4195 ml  Net -1732.54 ml   Filed Weights   04/08/19 0500 04/09/19 0500 04/10/19 0410  Weight: 102.6 kg 99.5 kg 99.6 kg    General Appearance:  Acute on chronically ill appearing male, NAD HEENT:  [place]/AT, PERRL, EOM-I and MMM, ETT is in a good position Lungs: CTA bilaterally Heart: RRR, Nl S1/S2 and -M/R/G Abdomen:  Soft, NT, ND and +BS Extremities:  1+ edema, AKAs noted Skin:  ntact in exposed areas . Sacral area - not examined Neurologic:  Sedation - diprivan and fent gtt -> RASS - +1  . Moves all 4s - tardive movement on face. CAM-ICU - no . Orientation - no  I reviewed CXR my self, ETT is in a good position  Resolved Hospital Problem list     Assessment & Plan:    ASSESSMENT / PLAN:  Acute resp failuire due to cardiac arrest and multiple issues P:   Begin PS trials Decrease PEEP to 5 cmH2O Titrate O2 for sat of 88-92% Hold of extubation until neuro status is addressed  On cymbalta, seroquie, halcion,  and neurontin at home Acute encephalopathy - post arrest + . Head CT 7/26 - negative P:   D/C propofol Fent gtt Start precedex Neuro consult - Dr Mike Craze - MRI ordered for today Monitor   Criculatory shock - some of this is diprivan mediated possibly and also cardiogenic Also, PVD with RLE acute ischemia  /- sp RKAA P:  Dc a line DC Levophed gtt off MAR -  MAP goal > 65% Heparin gtt Tele monitoring Active diureses today  Combined acute on chronic systolic and diastolic heart failure - baseline ef 45% Coronary artery disease, and hypertension. - Post arrest ef - 25% P: Per cardiology Lasix 40 mg IV q6 x3 doses BMET in AM Replace K and Mg  S/p VT arrest with cardioversion, presumed pulseless VT, P: Per cardioogy Full code Tele monitoring  No evidence of infection P:   [place]  A:  CKD III - baseline creat 1.3 (Hx Ureter cancer s/p L nephrectomy andL abdominal protuberance  AKI at admit post arrest P:  Monitor BMET in AM Avoid nephrotoxic drugs  At risk hyperglyceia  - DM2 P:   SSI  Mid line sacral wound P monitor  Rt AKA 03/22/2019 Plan VVS  Hypomag Plan - Replete - Recheck  Spoke with family 8/4, DNR status.  Once neuro status is clear then will communicate regarding plan of care.  Best practice:  Diet: NPO  Pain/Anxiety/Delirium protocol (if indicated): Fent, diprivan VAP protocol (if indicated): yes  DVT prophylaxis: SCD  GI prophylaxis: Pepcid Glucose control: SSI Mobility: BR  Code Status: Full   Family Communication:  calle wife  11:42 AM 04/08/2019 over telpehone - but no answer  Disposition: ICU  LABS   PULMONARY Recent Labs  Lab 04/03/19 2217 04/03/19 2316 04/04/19 0050  04/04/19 1034 04/06/19 0936 04/10/19 0339  PHART 7.157* 7.223*  --  7.327* 7.414 7.491*  PCO2ART 59.0* 52.4*  --  42.5 48.6* 51.3*  PO2ART 155.0* 170.0*  --  46.0* 94.5 60.0*  HCO3 20.7 21.5  --  22.3 30.5* 39.2*  TCO2 22 23  --  24  --  41*  O2SAT 99.0 99.0 77.8 78.0 97.3 92.0   CBC Recent Labs  Lab 04/07/19 0434 04/08/19 0452 04/09/19 0353 04/10/19 0339  HGB 10.0* 10.3* 10.4* 9.9*  HCT 30.8* 31.2* 31.4* 29.0*  WBC 5.6 5.8 6.0  --   PLT 152 156 162  --    COAGULATION Recent Labs  Lab 03/08/2019 0448 04/06/19 0332 04/07/19 0434 04/08/19 0452 04/09/19 0353  INR 1.7* 1.7* 1.4* 1.3* 1.2   CARDIAC  No results for input(s): TROPONINI in the last 168 hours.  No results for input(s): PROBNP in the last 168 hours.  CHEMISTRY Recent Labs  Lab 04/03/19 1700  04/04/19 0209  03/21/2019 0448 04/06/19 0332 04/07/19 0150 04/07/19 0434 04/08/19 0452 04/09/19 0353 04/10/19 0339  NA  --    < > 139   < > 143 142 141  --   --  142 141  K  --    < > 3.8   < > 3.5 3.8 3.4*  --   --  4.4 4.0  CL  --    < > 106  --  106 103 103  --   --  98  --   CO2  --    < > 20*  --  27 30 30   --   --  30  --   GLUCOSE  --    < > 177*  --  197* 202* 179*  --   --  169*  --   BUN  --    < > 64*  --  60* 50* 48*  --   --  53*  --   CREATININE  --    < > 2.76*  --  2.09* 1.59* 1.36*  --   --  1.18  --   CALCIUM  --    < > 8.1*  --  7.7* 7.5* 7.9*  --   --  8.6*  --   MG  --   --  2.1  --   --  1.6*  --  2.1 1.7 1.7  --   PHOS 6.0*  --   --   --   --  2.5  --  1.8* 2.5 3.0  --    < > = values in this interval not displayed.   Estimated Creatinine Clearance: 65.1 mL/min (by C-G formula based on SCr of 1.18 mg/dL).  LIVER Recent Labs  Lab 04/03/19 2306  04/04/19 0209 03/25/2019 0448 04/06/19 0332 04/07/19 0434 04/08/19 0452 04/09/19 0353  AST 948*  --  825*  --  49*  --  95*  --   ALT 448*  --  439*  --  119*  --  99*  --   ALKPHOS 214*  --  195*  --  87  --  95  --   BILITOT 0.3  --  0.6   --  0.3  --  0.6  --   PROT 5.5*  --  5.3*  --  4.4*  --  4.7*  --   ALBUMIN 2.3*  --  2.3*  --  1.6*  --  1.5*  --   INR  --    < > 1.5* 1.7* 1.7* 1.4* 1.3* 1.2   < > = values in this interval not displayed.   INFECTIOUS Recent Labs  Lab 04/06/19 0332 04/08/19 0032 04/08/19 1907 04/09/19 0353  LATICACIDVEN 1.7 1.1 1.1  --   PROCALCITON  --   --  0.73 0.68   ENDOCRINE CBG (last 3)  Recent Labs    04/09/19 2338 04/10/19 0342 04/10/19 0731  GLUCAP 129* 153* 158*   IMAGING x48h  - image(s) personally visualized  -   highlighted in bold Ct Head Wo Contrast  Result Date: 04/09/2019 CLINICAL DATA:  Hypoxic encephalopathy EXAM: CT HEAD WITHOUT CONTRAST TECHNIQUE: Contiguous axial images were obtained from the base of the skull through the vertex without intravenous contrast. COMPARISON:  03/31/2019 FINDINGS: Brain: Chronic atrophic changes are identified similar to that seen on prior exam.  No findings to suggest acute hemorrhage, acute infarction or space-occupying mass lesion are seen. Vascular: No hyperdense vessel or unexpected calcification. Skull: Normal. Negative for fracture or focal lesion. Sinuses/Orbits: Mucosal thickening is noted within the sphenoid sinus on left. This is new from the prior exam. The remainder of the paranasal sinuses are within normal limits. Other: None IMPRESSION: Chronic atrophic changes without acute abnormality. Electronically Signed   By: Inez Catalina M.D.   On: 04/09/2019 00:50   Dg Chest Port 1 View  Result Date: 04/10/2019 CLINICAL DATA:  Intubation.  Respiratory failure. EXAM: PORTABLE CHEST 1 VIEW COMPARISON:  04/09/2019. FINDINGS: Endotracheal tube and NG tube in stable position. Prior CABG. Stable cardiomegaly. Persistent bibasilar atelectasis and infiltrates/edema again noted. Small pleural effusions again noted. No significant interim change. No pneumothorax. IMPRESSION: 1.  Lines and tubes stable position. 2.  Prior CABG.  Cardiomegaly again noted.  3. Persistent bibasilar atelectasis and infiltrates/edema noted. Persistent bilateral small pleural effusion noted. No interim change from prior exam. Electronically Signed   By: [place]   On: 04/10/2019 08:18   Dg Chest Port 1 View  Result Date: 04/09/2019 CLINICAL DATA:  Intubation.  Respiratory failure. EXAM: PORTABLE CHEST 1 VIEW COMPARISON:  04/08/2019.  04/07/2019. FINDINGS: Lower chest incompletely imaged. Endotracheal tube and NG tube appear in stable position. Stable cardiomegaly. Persistent bibasilar atelectasis/infiltrates and small bilateral pleural effusions. No significant interim change. No pneumothorax. IMPRESSION: 1.  Endotracheal tube and NG tube stable position. 2.  Stable cardiomegaly. 3. Persistent bibasilar atelectasis/infiltrates and small bilateral pleural effusions. No significant interim change. Electronically Signed   By: Marcello Moores  Register   On: 04/09/2019 07:13   The patient is critically ill with multiple organ systems failure and requires high complexity decision making for assessment and support, frequent evaluation and titration of therapies, application of advanced monitoring technologies and extensive interpretation of multiple databases.   Critical Care Time devoted to patient care services described in this note is  33  Minutes. This time reflects time of care of this signee Dr Jennet Maduro. This critical care time does not reflect procedure time, or teaching time or supervisory time of PA/NP/Med student/Med Resident etc but could involve care discussion time.  Rush Farmer, M.D. [facility] Pulmonary/Critical Care Medicine. Pager: [phone]. After hours pager: [phone].

## 2019-04-10 NOTE — Procedures (Addendum)
Patient Name: [name]  MRN: [id number]  Epilepsy Attending: Lora Havens  Referring Physician/Provider: Dr. Kathrynn Speed Date: 04/10/2019 Duration: 25.55 minutes  Patient history: 78 year old male with cardiac arrest.  EEG to evaluate for seizures  Level of alertness: sedated, comatose  Technical aspects: This EEG study was done with scalp electrodes positioned according to the 10-20 International system of electrode placement. Electrical activity was acquired at a sampling rate of 500Hz  and reviewed with a high frequency filter of 70Hz  and a low frequency filter of 1Hz . EEG data were recorded continuously and digitally stored.   Description: EEG showed 3 to 5-seconds of 5 to 6 Hz generalized theta slowing intermixed with 7 to 10 seconds of diffuse suppression.  EEG was intermittently reactive to tactile stimulation.  Hyperventilation and photic stimulation were not performed  IMPRESSION: This study is suggestive of profound diffuse encephalopathy. No seizures or epileptiform discharges were seen throughout the recording.        Rosella Crandell Barbra Sarks

## 2019-04-10 NOTE — Progress Notes (Signed)
ANTICOAGULATION CONSULT NOTE - [place] for heparin Indication: chest pain/ACS  Allergies  Allergen Reactions  . Erythromycin Anaphylaxis and Rash  . Iohexol Anaphylaxis, Hives, Itching and Other (See Comments)    Note:  13 HR PRE-MED GIVEN AND SUCCESSFUL-ARS 07/15/07.     Marland Kitchen Penicillins Anaphylaxis, Itching and Other (See Comments)    Has patient had a PCN reaction causing immediate rash, facial/tongue/throat swelling, SOB or lightheadedness with hypotension: Yes Has patient had a PCN reaction causing severe rash involving mucus membranes or skin necrosis: No Has patient had a PCN reaction that required hospitalization No Has patient had a PCN reaction occurring within the last 10 years: No If all of the above answers are "NO", then may proceed with Cephalosporin use.    Patient Measurements: Height: 6' (182.9 cm) Weight: 219 lb 9.3 oz (99.6 kg) IBW/kg (Calculated) : 77.6 Heparin Dosing Weight: 97.3 kg  Vital Signs: Temp: 99.5 F (37.5 C) (08/05 1030) Temp Source: Bladder (08/05 0800) BP: 95/62 (08/05 1030) Pulse Rate: 97 (08/05 1030)  Labs: Recent Labs    04/07/19 1229  04/08/19 0452 04/09/19 0353 04/10/19 0339 04/10/19 0407  HGB  --    < > 10.3* 10.4* 9.9* 9.8*  HCT  --    < > 31.2* 31.4* 29.0* 30.7*  PLT  --   --  156 162  --  194  LABPROT  --   --  15.6* 14.9  --  15.7*  INR  --   --  1.3* 1.2  --  1.3*  HEPARINUNFRC 0.32  --  0.31 0.50  --  0.66  CREATININE  --   --   --  1.18  --  1.22  CKTOTAL 183  --   --   --   --   --   CKMB 2.1  --   --   --   --   --    < > = values in this interval not displayed.    Estimated Creatinine Clearance: 63 mL/min (by C-G formula based on SCr of 1.22 mg/dL).   Medical History: Past Medical History:  Diagnosis Date  . Arthritis    "qwhere" (07/09/2013)  . Asthma   . COPD (chronic obstructive pulmonary disease) ([place])    pt denies this hx on 07/09/2013; has chronic dyspnea  . Coronary artery  disease    Reportedly multivessel disease-CABG  . Gout   . H/O hiatal hernia 1997  . H/O: rheumatic fever    "had rheumatic fever as a kid" (07/09/2013) -has murmur  . Hyperlipidemia with target LDL less than 70   . Hypertension   . Ischemic cardiomyopathy    By report,-ICD  . Myocardial infarction ([place]) 03/2002; ~ 2011  . Neuropathy   . PAD (peripheral artery disease) ([place]) 2014   Right SFA stent November 2014; status post left AKA  . Pneumonia    "once" (07/09/2013)  . Renal disorder    "only have 1 kidney; due to go back soon to check the other one" (07/09/2013)  . RUPTURE ROTATOR CUFF 12/03/2009   Qualifier: Diagnosis of  By: Aline Brochure MD, Dorothyann Peng    . Type II diabetes mellitus ([place])   . Ureter cancer ([place]) 12/2001   "shut down my kidney & resulted in nephrectomy" (07/09/2013)  . UTI (urinary tract infection) 10/04/2018    Assessment: 78 yo male with VT and concern for ACS (hx MI and CABG) also with ischemic right foot now post R AKA.  Heparin level (0.66) remains therapeutic at drip rate 2450 units/hr. Hgb (9.8) stable, PLT improved at 194. No bleeding or infusion issues per RN.  Goal of Therapy: Heparin level 0.3-0.7 Monitor platelets by anticoagulation protocol: Yes  Plan: Continue heparin at 2450 units/hr Daily heparin level and cbc Monitor signs of bleeding  Richardine Service, PharmD PGY1 Pharmacy Resident Phone: [phone] 04/10/2019  10:53 AM  Please check [email or web address] for unit-specific pharmacy phone numbers.

## 2019-04-10 NOTE — Progress Notes (Signed)
   VASCULAR SURGERY ASSESSMENT & PLAN:   5 Days Post-Op s/p: Right AKA - healing nicely.  SUBJECTIVE:   Sedated on vent  PHYSICAL EXAM:   Vitals:   04/10/19 0400 04/10/19 0410 04/10/19 0500 04/10/19 0600  BP: 98/63  95/63 99/63  Pulse: 99  100 100  Resp:      Temp: 99.5 F (37.5 C)  99.7 F (37.6 C) 99.9 F (37.7 C)  TempSrc:      SpO2: 97%  95% 95%  Weight:  99.6 kg    Height:       Right AKA looks fine.   LABS:   Lab Results  Component Value Date   WBC 6.0 04/09/2019   HGB 9.9 (L) 04/10/2019   HCT 29.0 (L) 04/10/2019   MCV 86.7 04/09/2019   PLT 162 04/09/2019    CBG  Recent Labs    04/09/19 1943 04/09/19 2338 04/10/19 0342  GLUCAP 111* 129* 153*    PROBLEM LIST:    Principal Problem:   Cardiac arrest ([place]) Active Problems:   ACC/AHA stage B congestive heart failure due to ischemic cardiomyopathy (HCC)   Peripheral vascular disease (HCC)   Acute respiratory failure (HCC)   Coronary artery disease involving native coronary artery of native heart with angina pectoris ([place])   Diabetes mellitus with peripheral vascular disease ([place])   Tobacco abuse   Ventricular tachycardia (HCC)   Acute on chronic combined systolic and diastolic CHF (congestive heart failure) ([place])   Demand ischemia (HCC)   Critical lower limb ischemia   PVCs (premature ventricular contractions)   Hypoxia   Cardiogenic shock (HCC)   CURRENT MEDS:   . aspirin  81 mg Per Tube Daily  . chlorhexidine gluconate (MEDLINE KIT)  15 mL Mouth Rinse BID  . Chlorhexidine Gluconate Cloth  6 each Topical Daily  . famotidine  20 mg Per Tube QHS  . feeding supplement (PRO-STAT SUGAR FREE 64)  30 mL Per Tube BID  . insulin aspart  0-15 Units Subcutaneous Q4H  . ipratropium-albuterol  3 mL Nebulization Q4H  . mouth rinse  15 mL Mouth Rinse 10 times per day    [name] Beeper: [phone] Office: [phone] 04/10/2019

## 2019-04-10 NOTE — Progress Notes (Signed)
EEG complete - results pending 

## 2019-04-11 ENCOUNTER — Inpatient Hospital Stay (HOSPITAL_COMMUNITY): Payer: Medicare HMO

## 2019-04-11 ENCOUNTER — Encounter (HOSPITAL_COMMUNITY): Payer: Self-pay | Admitting: *Deleted

## 2019-04-11 DIAGNOSIS — Z7189 Other specified counseling: Secondary | ICD-10-CM

## 2019-04-11 DIAGNOSIS — Z515 Encounter for palliative care: Secondary | ICD-10-CM

## 2019-04-11 LAB — CBC
HCT: 31.5 % — ABNORMAL LOW (ref 39.0–52.0)
Hemoglobin: 9.9 g/dL — ABNORMAL LOW (ref 13.0–17.0)
MCH: 28.2 pg (ref 26.0–34.0)
MCHC: 31.4 g/dL (ref 30.0–36.0)
MCV: 89.7 fL (ref 80.0–100.0)
Platelets: 218 10*3/uL (ref 150–400)
RBC: 3.51 MIL/uL — ABNORMAL LOW (ref 4.22–5.81)
RDW: 16.6 % — ABNORMAL HIGH (ref 11.5–15.5)
WBC: 7.8 10*3/uL (ref 4.0–10.5)
nRBC: 0 % (ref 0.0–0.2)

## 2019-04-11 LAB — BASIC METABOLIC PANEL
Anion gap: 12 (ref 5–15)
BUN: 58 mg/dL — ABNORMAL HIGH (ref 8–23)
CO2: 32 mmol/L (ref 22–32)
Calcium: 8.7 mg/dL — ABNORMAL LOW (ref 8.9–10.3)
Chloride: 97 mmol/L — ABNORMAL LOW (ref 98–111)
Creatinine, Ser: 1.27 mg/dL — ABNORMAL HIGH (ref 0.61–1.24)
GFR calc Af Amer: >60 mL/min (ref >60)
GFR calc non Af Amer: 55 mL/min — ABNORMAL LOW (ref >60)
Glucose, Bld: 153 mg/dL — ABNORMAL HIGH (ref 70–99)
Potassium: 5.1 mmol/L (ref 3.5–5.1)
Sodium: 141 mmol/L (ref 135–145)

## 2019-04-11 LAB — POCT I-STAT 7, (LYTES, BLD GAS, ICA,H+H)
Acid-Base Excess: 13 mmol/L — ABNORMAL HIGH (ref 0.0–2.0)
Bicarbonate: 38 mmol/L — ABNORMAL HIGH (ref 20.0–28.0)
Calcium, Ion: 1.21 mmol/L (ref 1.15–1.40)
HCT: 29 % — ABNORMAL LOW (ref 39.0–52.0)
Hemoglobin: 9.9 g/dL — ABNORMAL LOW (ref 13.0–17.0)
O2 Saturation: 92 %
Patient temperature: 38.3
Potassium: 4.9 mmol/L (ref 3.5–5.1)
Sodium: 141 mmol/L (ref 135–145)
TCO2: 40 mmol/L — ABNORMAL HIGH (ref 22–32)
pCO2 arterial: 56.4 mmHg — ABNORMAL HIGH (ref 32.0–48.0)
pH, Arterial: 7.442 (ref 7.350–7.450)
pO2, Arterial: 67 mmHg — ABNORMAL LOW (ref 83.0–108.0)

## 2019-04-11 LAB — GLUCOSE, CAPILLARY
Glucose-Capillary: 135 mg/dL — ABNORMAL HIGH (ref 70–99)
Glucose-Capillary: 196 mg/dL — ABNORMAL HIGH (ref 70–99)
Glucose-Capillary: 166 mg/dL — ABNORMAL HIGH (ref 70–99)

## 2019-04-11 LAB — PHOSPHORUS: Phosphorus: 5 mg/dL — ABNORMAL HIGH (ref 2.5–4.6)

## 2019-04-11 LAB — HEPARIN LEVEL (UNFRACTIONATED): Heparin Unfractionated: 0.62 IU/mL (ref 0.30–0.70)

## 2019-04-11 LAB — MAGNESIUM: Magnesium: 2.1 mg/dL (ref 1.7–2.4)

## 2019-04-11 LAB — PROTIME-INR
INR: 1.1 (ref 0.8–1.2)
Prothrombin Time: 14.1 seconds (ref 11.4–15.2)

## 2019-04-11 LAB — TRIGLYCERIDES: Triglycerides: 100 mg/dL (ref <150)

## 2019-04-11 MED ORDER — MORPHINE BOLUS VIA INFUSION
5.0000 mg | INTRAVENOUS | Status: DC | PRN
  Administered 2019-04-11 (×8): 5 mg via INTRAVENOUS
  Filled 2019-04-11: qty 5

## 2019-04-11 MED ORDER — GLYCOPYRROLATE 0.2 MG/ML IJ SOLN: 0.2000 mg | INTRAMUSCULAR | Status: DC | PRN

## 2019-04-11 MED ORDER — GLYCOPYRROLATE 1 MG PO TABS
1.0000 mg | ORAL_TABLET | ORAL | Status: DC | PRN
  Filled 2019-04-11: qty 1

## 2019-04-11 MED ORDER — POLYVINYL ALCOHOL 1.4 % OP SOLN
1.0000 [drp] | Freq: Four times a day (QID) | OPHTHALMIC | Status: DC | PRN
  Filled 2019-04-11: qty 15

## 2019-04-11 MED ORDER — VANCOMYCIN HCL 10 G IV SOLR
1750.0000 mg | INTRAVENOUS | Status: DC
  Filled 2019-04-11: qty 1750

## 2019-04-11 MED ORDER — FUROSEMIDE 10 MG/ML IJ SOLN: 40.0000 mg | Freq: Four times a day (QID) | INTRAMUSCULAR | Status: DC

## 2019-04-11 MED ORDER — MORPHINE SULFATE (PF) 2 MG/ML IV SOLN
2.0000 mg | INTRAVENOUS | Status: DC | PRN
  Administered 2019-04-11: 2 mg via INTRAVENOUS
  Filled 2019-04-11: qty 1

## 2019-04-11 MED ORDER — ACETAMINOPHEN 325 MG PO TABS: 650.0000 mg | ORAL_TABLET | Freq: Four times a day (QID) | ORAL | Status: DC | PRN

## 2019-04-11 MED ORDER — ENOXAPARIN SODIUM 30 MG/0.3ML ~~LOC~~ SOLN
30.0000 mg | SUBCUTANEOUS | Status: DC
  Administered 2019-04-11: 30 mg via SUBCUTANEOUS
  Filled 2019-04-11: qty 0.3

## 2019-04-11 MED ORDER — DEXTROSE 5 % IV SOLN: INTRAVENOUS | Status: DC

## 2019-04-11 MED ORDER — DIPHENHYDRAMINE HCL 50 MG/ML IJ SOLN: 25.0000 mg | INTRAMUSCULAR | Status: DC | PRN

## 2019-04-11 MED ORDER — ACETAMINOPHEN 650 MG RE SUPP: 650.0000 mg | Freq: Four times a day (QID) | RECTAL | Status: DC | PRN

## 2019-04-11 MED ORDER — MORPHINE 100MG IN NS 100ML (1MG/ML) PREMIX INFUSION
0.0000 mg/h | INTRAVENOUS | Status: DC
  Administered 2019-04-11 (×2): 30 mg/h via INTRAVENOUS
  Administered 2019-04-11: 5 mg/h via INTRAVENOUS
  Filled 2019-04-11 (×3): qty 100

## 2019-04-12 ENCOUNTER — Telehealth: Payer: Self-pay

## 2019-04-12 NOTE — Telephone Encounter (Signed)
Received dc from [facility] (faxed copy).   Dc is for cremation and a patient of Doctor Nelda Marseille.   Dc will be taken to 2 heart for signature.

## 2019-04-13 LAB — CULTURE, BLOOD (ROUTINE X 2)
Culture: NO GROWTH
Culture: NO GROWTH
Special Requests: ADEQUATE
Special Requests: ADEQUATE

## 2019-04-17 NOTE — Telephone Encounter (Signed)
04/17/2019 Received D/C back and Faxed copy back to [facility] @ [employer] home [phone]. Awaiting original D/C to come to [name]. PWR   04/18/19 Received original DC will Send to Dr.Yacoub to sign @ 5M on 04/22/2019. When he returns. PWR

## 2019-04-23 NOTE — Telephone Encounter (Signed)
Received original D/C back signed,D/C mailed to [place] [address].

## 2019-05-01 NOTE — Telephone Encounter (Signed)
DC has been signed.

## 2019-05-07 NOTE — Progress Notes (Signed)
Pharmacy Antibiotic Note  [name] is a 78 y.o. male admitted on 03/28/2019 post arrest.  Pharmacy has been consulted for vancomycin and zosyn dosing for possible sepsis.  Patient's fever trending up with a Tmax of 101.6 after being afebrile for 2 days. WBC (7.8) remains WNL. Renal function remains stable. Patient has lost ~5 kg within the past 3 days.  Plan: Increase Vancomycin to 1750 IV every 24 hours.   Zosyn 3.375g IV q8h (4 hour infusion). F/U repeat cultures and UA Monitor cultures and sensitivities, renal function, and clinical progression  Vancomycin 1750 mg IV Q 24 hrs. Goal AUC 400-550. Expected AUC: 501.8 SCr used: 1.27   Height: 6' (182.9 cm) Weight: 215 lb 13.3 oz (97.9 kg) IBW/kg (Calculated) : 77.6  Temp (24hrs), Avg:100.1 F (37.8 C), Min:99.1 F (37.3 C), Max:101.3 F (38.5 C)  Recent Labs  Lab 04/06/19 0332 04/07/19 0150 04/07/19 0434 04/08/19 0032 04/08/19 0452 04/08/19 1907 04/09/19 0353 04/10/19 0407 04/30/19 0348  WBC 6.1  --  5.6  --  5.8  --  6.0 6.9 7.8  CREATININE 1.59* 1.36*  --   --   --   --  1.18 1.22 1.27*  LATICACIDVEN 1.7  --   --  1.1  --  1.1  --   --   --     Estimated Creatinine Clearance: 60 mL/min (A) (by C-G formula based on SCr of 1.27 mg/dL (H)).    Allergies  Allergen Reactions  . Erythromycin Anaphylaxis and Rash  . Iohexol Anaphylaxis, Hives, Itching and Other (See Comments)    Note:  13 HR PRE-MED GIVEN AND SUCCESSFUL-ARS 07/15/07.     Marland Kitchen Penicillins Anaphylaxis, Itching and Other (See Comments)    Has patient had a PCN reaction causing immediate rash, facial/tongue/throat swelling, SOB or lightheadedness with hypotension: Yes Has patient had a PCN reaction causing severe rash involving mucus membranes or skin necrosis: No Has patient had a PCN reaction that required hospitalization No Has patient had a PCN reaction occurring within the last 10 years: No If all of the above answers are "NO", then may proceed  with Cephalosporin use.    Antimicrobials this admission: Zosyn 8/3 >>  Vanc 8/3 >>   Dose adjustments this admission: 8/3: Vancomycin 1250 mg IV Q 24 hrs.  8/6: Vancomycin 1250 mg Q24h, Est AUC 358 >> Increase to Vanc 1750 Q24h  Microbiology results: Repeat Ucx, and UA today 8/3 Bcx: ngtd 8/3 Ucx: ngtd 8/3 TA:  7/26 bld cx: ng 7/26 Resp cx: ng 07/30 MRSA PCR: negative  Thank you for allowing pharmacy to be a part of this patient's care.  Richardine Service, PharmD PGY1 Pharmacy Resident Phone: [phone] 04/30/19  1:05 PM  Please check [email or web address] for unit-specific pharmacy phone numbers.

## 2019-05-07 NOTE — Progress Notes (Addendum)
NAME:  [name], MRN:  [id number], DOB:  22-Aug-1941, LOS: 12 ADMISSION DATE:  04/03/2019, CONSULTATION DATE:  03/06/2019 REFERRING MD:  Forestine Na EM (Dr. Sabra Heck), CHIEF COMPLAINT:  S/p VT and 1x cardioversion  Brief History   78 yo M s/p VT arrest s/p cardioversion x 1 by EMS, unclear if patient was pulseless or had pulse. Intubated at [facility] and transferred to [facility]  SARS-CoV2 Negative 03/29/2019  Past Medical History  COPD, DM2, CAD, MI, s/p CABG , Systolic heart failure ICM HTN, HLD, EtOH abuse, FTT, PVD , Ureter Cancer s/p L nephrectomy  CKD III   Significant Hospital Events   7/25 s/p VT arrest with ROSC after 1x cardioversion 120J. Intubated at [facility], CVC placed. Transferring to [facility] for ICU admission   7/26: Seen by vascular surgery for ischemic right limb felt that the extremity was not salvageable and recommended amputation.  7/28: Having episodes of desaturation during weaning efforts.  Vasoactive drips weaned off.  Being treated for possible infiltrate in right lower lobe.  7/29: Desaturated down to the 60s requiring bag valve mask ventilation, associated hypotension requiring escalation of vasoactive drips.  Arterial blood gas obtained showing pH of 7.16, PCO2 of 58, saturations 155 and bicarbonate 22, bicarbonate drip initiated  7/30: Vasopressin discontinued, now on just norepinephrine at decreasing dose.  Still having trouble weaning pressors.  Did get 1 dose of Lasix overnight.  Seen by vascular surgery planning for above-the-knee amputation  7/31  - slight improvement in renal function. 80% fio2, peep 12.levophed 7. ,diprivan gtt an fent gtt. More awake. R AKA later today  8/1-  s/p R AKA yesterday due to ischemic right lower extremity. Back on heparin gtt. Renal function better. CK/Lactate normal on diprivan gtt. Also on fentt gtt, levophed gtt, bic gtt. On TF. Vent at 60%, peep 8. LFT better  8/2- -VVS pleased with wound healing of R AKA. On fent gtt,  diprivan gtt and levophed gtt. Low dose bic gtt.+ Heparing gtt +  50% fio2 on vent/peep 8. AKI better - creat 1.5. Now +13.5L positive  8/6 -- Patient remains unchanged symptomatically today. He continues to look aimlessly around the room. MRI ordered and family discussion planned for today.   Consults:  Vascular surgery  Cardiology   Procedures:  7/25 ETT>> 7/25 CVC >> ?08/01  Significant Diagnostic Tests:  CXR 7/25> ETT and CVC in adequate position. No pneumothorax. Cardiomegaly. Bilateral pulmonary infiltrates vs edema   CT Head 7/25 > NAICA CT Abdomen/Pelvis 7/25 - Bilateral pleural effusions, bibasilar airspace opacities, cardiomegaly, anasarca, absent left kidney EEG >> unremarkable, will need repeated per neurology MRI Brain >>   Micro Data:  7/25 SARS CoV2 > Negative  7/26 BCX > Negative x 5 days 7/26 RCX > Normal flora 08/3 BCX > Negative to date  Antimicrobials:  7/25 Rocephin>> 04/06/2019 8/3 Zosyn >> 8/4 Vancomycin >>  Interim history/subjective:   None purposeful head movement. Agitation overnight, off of propofol, now on precedex and fentanyl   Objective   Blood pressure 98/62, pulse 79, temperature (!) 101.3 F (38.5 C), resp. rate 20, height 6' (1.829 m), weight 97.9 kg, SpO2 100 %.    Vent Mode: PRVC FiO2 (%):  [40 %-50 %] 50 % Set Rate:  [20 bmp] 20 bmp Vt Set:  [510 mL] 510 mL PEEP:  [5 cmH20] 5 cmH20 Plateau Pressure:  [13 cmH20-17 cmH20] 14 cmH20   Intake/Output Summary (Last 24 hours) at 05/05/2019 0849 Last data  filed at April 30, 2019 0600 Gross per 24 hour  Intake 2826.58 ml  Output 3010 ml  Net -183.42 ml   Filed Weights   04/09/19 0500 04/10/19 0410 04/30/2019 0500  Weight: 99.5 kg 99.6 kg 97.9 kg    Examination  Vital signs in last 24 hours: Temp:  [99 F (37.2 C)-101.3 F (38.5 C)] 101.3 F (38.5 C) (08/06 0700) Pulse Rate:  [71-101] 79 (08/06 0807) Resp:  [20] 20 (08/06 0807) BP: (85-111)/(52-72) 98/62 (08/06 0807) SpO2:  [95 %-100  %] 100 % (08/06 0809) FiO2 (%):  [40 %-50 %] 50 % (08/06 0809) Weight:  [97.9 kg] 97.9 kg (08/06 0500)  HEENT-  Normocephalic, no lesions, without obvious abnormality.  Normal external eye and conjunctiva.   Cardiovascular- S1-S2 audible, pulses palpable  Lungs-no rhonchi or wheezing noted, no excessive working breathing.  Saturations within normal limits Abdomen- All 4 quadrants palpated and nontender Extremities- Warm, mild edema noted Musculoskeletal-no joint tenderness, deformity or swelling Skin-warm, weeping, edematous hands and scrotum   Neurological Examination Mental Status: Patient will turn his head towards movement, does not focus on the sound or individual speaking. Does not follow commands.   CN: PERRL but sluggish and pinpoint, corneal's intact Motor: No reponse to commands. There remains proximal withdrawal to nailbed pressure bilaterally  I reviewed CXR my self, ETT is in a good position  Resolved Hospital Problem list     Assessment & Plan:   ASSESSMENT / PLAN:  Acute resp failuire due to cardiac arrest and multiple issues P:   Decrease PEEP to 5 cmH2O after MRI Titrate O2 for sat of 88-92% Hold off extubation until neuro status has improved and MRI is obtained   Acute encephalopathy - post arrest: On cymbalta, seroquie, halcion,  and neurontin at home. Head CT 7/26 - negative P:   D/C propofol 08/05 Fent gtt  Continue precedex Neuro consult - Dr Mike Craze - MRI delayed until today Monitor   Criculatory shock - improved today.  /- sp RAKA P:  Discontinued the A-line DC Levophed gtt off MAR -  MAP goal > 65% Heparin gtt discontinued Start lovenox VTE PPX Tele monitoring Active diureses today  Combined acute on chronic systolic and diastolic heart failure - baseline ef 45% Coronary artery disease, and hypertension. - Post arrest EF - 25% P: Lasix 40 mg IV q6 x3 doses given prior day, BP soft today, will hold diuresis until after MRI Lasix  40mg  BID today after MRI BMET in AM Replace K and Mg PRN, elevated today  S/p VT arrest with cardioversion, presumed pulseless VT P: Patient is now DNR Tele monitoring  Infection: Patient febrile overnight to Tmax 101.6. No leukocytosis, Procalcitonin decreased from prior day but still elevated to 0.47 down from 0.68. CXR unremarkable. Sacral wound unchanged. Possible source.  P:   On vanc since 8/4 days Zosyn since 8/3 Repeat urine culture and UA today Trend CBC and fever curve  A:  CKD III - baseline creat 1.3 (Hx Ureter cancer s/p L nephrectomy andL abdominal protuberance  AKI at admit post arrest. sCr stable today at 1.27 P:  BMET in AM daily Avoid nephrotoxic drugs  At risk hyperglyceia  - DM2 P:   SSI  Mid line sacral wound P monitor  Rt AKA 03/30/2019: Wound site is clean Plan VVS  GOC discussion planned for today.   Best practice:  Diet: NPO  Pain/Anxiety/Delirium protocol (if indicated): Fent, diprivan VAP protocol (if indicated): yes  DVT prophylaxis: SCD  GI prophylaxis: Pepcid Glucose control: SSI Mobility: BR  Code Status: Full   Family Communication:  calle wife  11:42 AM 04/08/2019 over telpehone - but no answer  Disposition: ICU  LABS   PULMONARY Recent Labs  Lab 04/04/19 1034 04/06/19 0936 04/10/19 0339 05-01-19 0406  PHART 7.327* 7.414 7.491* 7.442  PCO2ART 42.5 48.6* 51.3* 56.4*  PO2ART 46.0* 94.5 60.0* 67.0*  HCO3 22.3 30.5* 39.2* 38.0*  TCO2 24  --  41* 40*  O2SAT 78.0 97.3 92.0 92.0   CBC Recent Labs  Lab 04/09/19 0353  04/10/19 0407 2019-05-01 0348 May 01, 2019 0406  HGB 10.4*   < > 9.8* 9.9* 9.9*  HCT 31.4*   < > 30.7* 31.5* 29.0*  WBC 6.0  --  6.9 7.8  --   PLT 162  --  194 218  --    < > = values in this interval not displayed.   COAGULATION Recent Labs  Lab 04/07/19 0434 04/08/19 0452 04/09/19 0353 04/10/19 0407 01-May-2019 0348  INR 1.4* 1.3* 1.2 1.3* 1.1   CARDIAC  No results for input(s): TROPONINI in the  last 168 hours. No results for input(s): PROBNP in the last 168 hours.  CHEMISTRY Recent Labs  Lab 04/06/19 0332 04/07/19 0150 04/07/19 0434 04/08/19 0452 04/09/19 0353 04/10/19 0339 04/10/19 0407 2019/05/01 0348 2019/05/01 0406  NA 142 141  --   --  142 141 143 141 141  K 3.8 3.4*  --   --  4.4 4.0 4.2 5.1 4.9  CL 103 103  --   --  98  --  98 97*  --   CO2 30 30  --   --  30  --  35* 32  --   GLUCOSE 202* 179*  --   --  169*  --  186* 153*  --   BUN 50* 48*  --   --  53*  --  54* 58*  --   CREATININE 1.59* 1.36*  --   --  1.18  --  1.22 1.27*  --   CALCIUM 7.5* 7.9*  --   --  8.6*  --  8.7* 8.7*  --   MG 1.6*  --  2.1 1.7 1.7  --  1.7 2.1  --   PHOS 2.5  --  1.8* 2.5 3.0  --  4.3 5.0*  --    Estimated Creatinine Clearance: 60 mL/min (A) (by C-G formula based on SCr of 1.27 mg/dL (H)).  LIVER Recent Labs  Lab 04/06/19 0332 04/07/19 0434 04/08/19 0452 04/09/19 0353 04/10/19 0407 May 01, 2019 0348  AST 49*  --  95*  --   --   --   ALT 119*  --  99*  --   --   --   ALKPHOS 87  --  95  --   --   --   BILITOT 0.3  --  0.6  --   --   --   PROT 4.4*  --  4.7*  --   --   --   ALBUMIN 1.6*  --  1.5*  --   --   --   INR 1.7* 1.4* 1.3* 1.2 1.3* 1.1   INFECTIOUS Recent Labs  Lab 04/06/19 0332 04/08/19 0032 04/08/19 1907 04/09/19 0353 04/10/19 0407  LATICACIDVEN 1.7 1.1 1.1  --   --   PROCALCITON  --   --  0.73 0.68 0.47   ENDOCRINE CBG (last 3)  Recent Labs    04/10/19 2335  04-12-19 0340 04-12-19 0824  GLUCAP 179* 135* 196*   IMAGING x48h  - image(s) personally visualized  -   highlighted in bold Dg Chest Port 1 View  Result Date: 04/12/2019 CLINICAL DATA:  78 year old male status post VT erosive arrest resuscitation last month. Recent right lower extremity amputation. EXAM: PORTABLE CHEST 1 VIEW COMPARISON:  04/10/2019 and earlier. FINDINGS: Portable AP semi upright view at 0546 hours. Stable ET tube and visible enteric tube. Stable lung volumes and mediastinal  contours. Ventilation has improved since 08/02 and 04/08/2019, with smaller veiling opacity at the right lung base. Dense retrocardiac opacity remains. No pneumothorax. Stable pulmonary vascularity without overt edema. No areas of worsening ventilation. IMPRESSION: 1. Stable lines and tubes. 2. Ventilation remains improved since 04/08/2019. Persistent right pleural effusion and bilateral lower lobe collapse or consolidation. Electronically Signed   By: Genevie Ann M.D.   On: 04-12-19 08:44   Dg Chest Port 1 View  Result Date: 04/10/2019 CLINICAL DATA:  Intubation.  Respiratory failure. EXAM: PORTABLE CHEST 1 VIEW COMPARISON:  04/09/2019. FINDINGS: Endotracheal tube and NG tube in stable position. Prior CABG. Stable cardiomegaly. Persistent bibasilar atelectasis and infiltrates/edema again noted. Small pleural effusions again noted. No significant interim change. No pneumothorax. IMPRESSION: 1.  Lines and tubes stable position. 2.  Prior CABG.  Cardiomegaly again noted. 3. Persistent bibasilar atelectasis and infiltrates/edema noted. Persistent bilateral small pleural effusion noted. No interim change from prior exam. Electronically Signed   By: Marcello Moores  Register   On: 04/10/2019 08:18   Kathi Ludwig, MD [facility] Internal Medicine, PGY-3 Pager # [phone]  Please see A/P and/or Addendum for final recommendations by: Dr. Nelda Marseille  Attending note:  78 year old male with extensive PMH presenting post cardiac arrest.  Remains on the vent overnight and not following commands.  Patient is off sedation at this point and not following commands with clear lungs on exam.  BP remains marginal overnight but not on pressors.  MRI scheduled for today.  EEG discontinued, results noted.  I reviewed CXR myself, ETT is in a good position.  Discussed with resident.  Will continue full vent support for now.  Continue care as ordered.  Pending MRI results and neurology opinion will meet with the family today and  discuss plan of care.  Family to arrive today.  PCCM will continue to manage.  The patient is critically ill with multiple organ systems failure and requires high complexity decision making for assessment and support, frequent evaluation and titration of therapies, application of advanced monitoring technologies and extensive interpretation of multiple databases.   Critical Care Time devoted to patient care services described in this note is  33  Minutes. This time reflects time of care of this signee Dr Jennet Maduro. This critical care time does not reflect procedure time, or teaching time or supervisory time of PA/NP/Med student/Med Resident etc but could involve care discussion time.  Rush Farmer, M.D. [facility] Pulmonary/Critical Care Medicine. Pager: [phone]. After hours pager: [phone].

## 2019-05-07 NOTE — Death Summary Note (Signed)
DEATH SUMMARY   Patient Details  Name: [name] MRN: [id number] DOB: [birth year]  Admission/Discharge Information   Admit Date:  2019-04-19  Date of Death: Date of Death: 05-01-2019  Time of Death: Time of Death: 1923/12/18  Length of Stay: 12-05-22  Referring Physician: Lucia Gaskins, MD   Reason(s) for Hospitalization  VT arrest  Diagnoses  Preliminary cause of death:   Ventricular tachycardia cardiac arrest Secondary Diagnoses (including complications and co-morbidities):  Principal Problem:   Cardiac arrest [facility]) Active Problems:   ACC/AHA stage B congestive heart failure due to ischemic cardiomyopathy ([place])   Peripheral vascular disease ([place])   Acute respiratory failure ([place])   Coronary artery disease involving native coronary artery of native heart with angina pectoris ([place])   Diabetes mellitus with peripheral vascular disease ([place])   Tobacco abuse   Ventricular tachycardia ([place])   Acute on chronic combined systolic and diastolic CHF (congestive heart failure) ([place])   Demand ischemia ([place])   Critical lower limb ischemia   PVCs (premature ventricular contractions)   Hypoxia   Cardiogenic shock ([place]) Anoxic encephalopathy  Brief Hospital Course (including significant findings, care, treatment, and services provided and events leading to death)  78 yo M s/p VT arrest s/p cardioversion x 1 by EMS, unclear if patient was pulseless or had pulse. Intubated at [facility] and transferred to [facility].  Seen by vascular surgery for ischemic right limb felt that the extremity was not salvageable and recommended amputation.  MRI was done that showed minimal damage but patient remains barely responsive.    Extensive discussion with wife and 2 sons.  MRI results relayed.  Informed that there are no signs of damage on MRI but patient is failing to respond and follow command and failing to wean.  Recommendations are more time via trach/peg and observation.  After a long discussion, the family  agreed (not originally) that patient has stated on multiple occasions that if he had to have another amputation that he would rather die, he did not want resuscitation or intubation let alone a trach/peg.  After further discussion, decision was made to proceed with a palliative approach with full DNR, start morphine and extubate when family is ready.  Orders placed.  Morphine was started and patient expired shortly thereafter with the family bedside.  Pertinent Labs and Studies  Significant Diagnostic Studies Ct Abdomen Pelvis Wo Contrast  Result Date: 03/31/2019 CLINICAL DATA:  Abdominal distension. EXAM: CT ABDOMEN AND PELVIS WITHOUT CONTRAST TECHNIQUE: Multidetector CT imaging of the abdomen and pelvis was performed following the standard protocol without IV contrast. COMPARISON:  October 04, 2018 FINDINGS: Lower chest: There is a moderate right and small left pleural effusion. There is consolidation involving the bilateral lung bases.The heart size is significantly enlarged. There is generalized volume overload. Hepatobiliary: The liver is normal. The gallbladder is not visualized and may be surgically absent.There is no biliary ductal dilation. Pancreas: Normal contours without ductal dilatation. No peripancreatic fluid collection. Spleen: No splenic laceration or hematoma. Adrenals/Urinary Tract: --Adrenal glands: No adrenal hemorrhage. --Right kidney/ureter: No hydronephrosis or perinephric hematoma. --Left kidney/ureter: The left kidney is surgically absent. --Urinary bladder: The bladder is decompressed with a Foley catheter and therefore is poorly evaluated. Stomach/Bowel: --Stomach/Duodenum: Enteric tube terminates in the region of the gastric pylorus/first portion of the duodenum. --Small bowel: No dilatation or inflammation. --Colon: No focal abnormality. --Appendix: Normal. Vascular/Lymphatic: Atherosclerotic calcification is present within the non-aneurysmal abdominal aorta, without  hemodynamically significant stenosis. --No retroperitoneal lymphadenopathy. --No mesenteric  lymphadenopathy. --No pelvic or inguinal lymphadenopathy. Reproductive: Unremarkable Other: There is a small volume of free fluid in the abdomen and pelvis. There is generalized volume overload with body wall edema. Musculoskeletal. The patient is status post prior vertebral augmentation of the L1 vertebral body. There are degenerative changes throughout the visualized thoracolumbar spine. IMPRESSION: 1. Moderate right and small left pleural effusions. 2. Bibasilar airspace opacities, favored to represent atelectasis on the right with an infiltrate or aspiration on the left. 3. Cardiomegaly. There is generalized volume overload with a small amount of free fluid in the abdomen and pelvis. 4. Solitary right kidney without evidence of hydronephrosis. 5. Additional chronic findings as above. Electronically Signed   By: Constance Holster M.D.   On: 03/31/2019 01:08   Dg Abd 1 View  Result Date: 04/03/2019 CLINICAL DATA:  Hypoxia EXAM: ABDOMEN - 1 VIEW COMPARISON:  06/03/2017 FINDINGS: Three images of the abdomen are submitted. Cardiomegaly and basilar airspace disease. Final image with time stamp at 10:39 p.m. demonstrates an esophageal tube with the side port in the region of the GE junction. Visible upper gas pattern is nonobstructed. IMPRESSION: Esophageal tube tip overlies proximal stomach, side-port in the region of GE junction. Suggest further advancement for more optimal positioning Electronically Signed   By: Donavan Foil M.D.   On: 04/03/2019 23:07   Ct Head Wo Contrast  Result Date: 04/09/2019 CLINICAL DATA:  Hypoxic encephalopathy EXAM: CT HEAD WITHOUT CONTRAST TECHNIQUE: Contiguous axial images were obtained from the base of the skull through the vertex without intravenous contrast. COMPARISON:  03/31/2019 FINDINGS: Brain: Chronic atrophic changes are identified similar to that seen on prior exam. No findings  to suggest acute hemorrhage, acute infarction or space-occupying mass lesion are seen. Vascular: No hyperdense vessel or unexpected calcification. Skull: Normal. Negative for fracture or focal lesion. Sinuses/Orbits: Mucosal thickening is noted within the sphenoid sinus on left. This is new from the prior exam. The remainder of the paranasal sinuses are within normal limits. Other: None IMPRESSION: Chronic atrophic changes without acute abnormality. Electronically Signed   By: Inez Catalina M.D.   On: 04/09/2019 00:50   Ct Head Wo Contrast  Result Date: 03/31/2019 CLINICAL DATA:  78 year old male with cardiac arrest. EXAM: CT HEAD WITHOUT CONTRAST TECHNIQUE: Contiguous axial images were obtained from the base of the skull through the vertex without intravenous contrast. COMPARISON:  Head CT dated 06/29/2016 FINDINGS: Brain: Mild-to-moderate age-related atrophy and chronic microvascular ischemic changes. There is no acute intracranial hemorrhage. No mass effect or midline shift. No extra-axial fluid collection. Vascular: No hyperdense vessel or unexpected calcification. Skull: Normal. Negative for fracture or focal lesion. Sinuses/Orbits: No acute finding. Other: Partially visualized endotracheal and enteric tubes. IMPRESSION: 1. No acute intracranial hemorrhage. 2. Age-related atrophy and chronic microvascular ischemic changes. Electronically Signed   By: Anner Crete M.D.   On: 03/31/2019 01:12   Mr Brain Wo Contrast  Result Date: Apr 22, 2019 CLINICAL DATA:  Encephalopathy.  Cardiac arrest. EXAM: MRI HEAD WITHOUT CONTRAST TECHNIQUE: Multiplanar, multiecho pulse sequences of the brain and surrounding structures were obtained without intravenous contrast. COMPARISON:  Head CT 04/09/2019 FINDINGS: The study is intermittently motion degraded including severe motion on the sagittal T1 sequence. Brain: Diffusion-weighted sequences are of good quality, and no restricted diffusion is seen to indicate acute  infarct. No intracranial hemorrhage, mass, midline shift, or extra-axial fluid collection is identified. There is moderate cerebral atrophy. No significant cerebral white matter disease is seen for age. Vascular: Major intracranial vascular flow voids  are preserved. Skull and upper cervical spine: No suspicious marrow lesion. Sinuses/Orbits: Left cataract extraction. Moderate left sphenoid sinus mucosal thickening. Moderate bilateral mastoid effusions. Other: None. IMPRESSION: 1. No acute intracranial abnormality. 2. Moderate cerebral atrophy. Electronically Signed   By: Logan Bores M.D.   On: 04/12/2019 13:49   Dg Chest 1v Repeat Same Day  Result Date: 03/25/2019 CLINICAL DATA:  Central line placement EXAM: CHEST - 1 VIEW SAME DAY COMPARISON:  March 30, 2019 at 6:21 p.m. FINDINGS: There is a newly placed right-sided central venous catheter with tip projecting over the proximal SVC. The endotracheal tube terminates above the carina by approximately 7.8 cm. The enteric tube is not fully visualized on this exam. Bilateral pulmonary opacities are again noted heart size is enlarged. Aortic calcifications are noted. The patient is status post prior median sternotomy. IMPRESSION: 1. Right-sided central venous catheter as above with no evidence of a right-sided pneumothorax. 2. Otherwise, stable appearance of the chest. The lung bases were not visualized on this exam. Electronically Signed   By: Constance Holster M.D.   On: 04/02/2019 19:30   Dg Chest Port 1 View  Result Date: 2019/04/12 CLINICAL DATA:  78 year old male status post VT erosive arrest resuscitation last month. Recent right lower extremity amputation. EXAM: PORTABLE CHEST 1 VIEW COMPARISON:  04/10/2019 and earlier. FINDINGS: Portable AP semi upright view at 0546 hours. Stable ET tube and visible enteric tube. Stable lung volumes and mediastinal contours. Ventilation has improved since 08/02 and 04/08/2019, with smaller veiling opacity at the right  lung base. Dense retrocardiac opacity remains. No pneumothorax. Stable pulmonary vascularity without overt edema. No areas of worsening ventilation. IMPRESSION: 1. Stable lines and tubes. 2. Ventilation remains improved since 04/08/2019. Persistent right pleural effusion and bilateral lower lobe collapse or consolidation. Electronically Signed   By: Genevie Ann M.D.   On: Apr 12, 2019 08:44   Dg Chest Port 1 View  Result Date: 04/10/2019 CLINICAL DATA:  Intubation.  Respiratory failure. EXAM: PORTABLE CHEST 1 VIEW COMPARISON:  04/09/2019. FINDINGS: Endotracheal tube and NG tube in stable position. Prior CABG. Stable cardiomegaly. Persistent bibasilar atelectasis and infiltrates/edema again noted. Small pleural effusions again noted. No significant interim change. No pneumothorax. IMPRESSION: 1.  Lines and tubes stable position. 2.  Prior CABG.  Cardiomegaly again noted. 3. Persistent bibasilar atelectasis and infiltrates/edema noted. Persistent bilateral small pleural effusion noted. No interim change from prior exam. Electronically Signed   By: [place]   On: 04/10/2019 08:18   Dg Chest Port 1 View  Result Date: 04/09/2019 CLINICAL DATA:  Intubation.  Respiratory failure. EXAM: PORTABLE CHEST 1 VIEW COMPARISON:  04/08/2019.  04/07/2019. FINDINGS: Lower chest incompletely imaged. Endotracheal tube and NG tube appear in stable position. Stable cardiomegaly. Persistent bibasilar atelectasis/infiltrates and small bilateral pleural effusions. No significant interim change. No pneumothorax. IMPRESSION: 1.  Endotracheal tube and NG tube stable position. 2.  Stable cardiomegaly. 3. Persistent bibasilar atelectasis/infiltrates and small bilateral pleural effusions. No significant interim change. Electronically Signed   By: Marcello Moores  Register   On: 04/09/2019 07:13   Dg Chest Port 1 View  Result Date: 04/08/2019 CLINICAL DATA:  Intubation.  Respiratory failure. EXAM: PORTABLE CHEST 1 VIEW COMPARISON:  Portable chest  04/07/2019. FINDINGS: Endotracheal tube and NG tube in stable position. Prior CABG. Cardiomegaly. Diffuse bilateral pulmonary infiltrates/edema bilateral pleural effusions again noted. No pneumothorax. IMPRESSION: 1.  Lines and tubes in stable position. 2. Prior CABG. Cardiomegaly with diffuse bilateral pulmonary infiltrates/edema. Bibasilar atelectasis. Small bilateral pleural effusions  similar findings noted on prior exam. Electronically Signed   By: Marcello Moores  Register   On: 04/08/2019 07:16   Dg Chest Port 1 View  Result Date: 04/07/2019 CLINICAL DATA:  78 year old male central line placement. Recent right lower extremity amputation. VT arrest with resuscitation 03/11/2019. EXAM: PORTABLE CHEST 1 VIEW COMPARISON:  0547 hours today. FINDINGS: Portable AP semi upright view at 0821 hours. Right IJ approach central line appears stable from earlier. ET tube and visible enteric tube also stable. No pneumothorax.  Unchanged ventilation. IMPRESSION: 1. Stable lines and tubes, including right IJ approach central line. 2. No pneumothorax and stable ventilation since 0547 hours. Electronically Signed   By: Genevie Ann M.D.   On: 04/07/2019 10:02   Dg Chest Port 1 View  Result Date: 04/07/2019 CLINICAL DATA:  78 year old male status post right lower extremity above the knee amputation postoperative day 2. Intubated. EXAM: PORTABLE CHEST 1 VIEW COMPARISON:  04/06/2019 and earlier. FINDINGS: Portable AP semi upright view at 0547 hours. Stable ET tube. Stable visible enteric tube. Stable right IJ central line. Stable lung volumes and ventilation with right greater than left veiling bibasilar opacity and dense retrocardiac opacity. Previous sternotomy. Stable cardiac size and mediastinal contours. No pneumothorax. Pulmonary vascularity appears within normal limits. IMPRESSION: 1.  Stable lines and tubes. 2. Stable ventilation: Right greater than left pleural effusions and bilateral lower lobe collapse or consolidation.  Electronically Signed   By: Genevie Ann M.D.   On: 04/07/2019 09:57   Dg Chest Port 1 View  Result Date: 04/06/2019 CLINICAL DATA:  ETT placement EXAM: PORTABLE CHEST 1 VIEW COMPARISON:  Chest radiograph 03/12/2019 FINDINGS: ET tube terminates in the mid trachea. Right IJ catheter projects over the right apex. Enteric tube courses inferior to the diaphragm. Monitoring leads overlie the patient. Stable cardiomegaly. Persistent small to moderate layering bilateral pleural effusions with underlying consolidation. IMPRESSION: Stable support apparatus. Persistent moderate layering effusions with underlying consolidation. Electronically Signed   By: Lovey Newcomer M.D.   On: 04/06/2019 07:14   Dg Chest Port 1 View  Result Date: 03/08/2019 CLINICAL DATA:  Intubation. EXAM: PORTABLE CHEST 1 VIEW COMPARISON:  04/03/2019. FINDINGS: NG tube noted with tip over the stomach. Endotracheal tube and right IJ line stable position. Prior CABG. Cardiomegaly with diffuse bilateral pulmonary interstitial prominence and small bilateral pleural effusions. Dense left lung base atelectasis/consolidation. No pneumothorax. IMPRESSION: 1. NG tube noted with tip over the stomach. Endotracheal tube and right IJ line stable position. 2. Prior CABG. Cardiomegaly with pulmonary venous congestion and diffuse bilateral pulmonary interstitial prominence. Bilateral pleural effusions. Findings suggest CHF. 3.  Dense left lung base atelectasis/consolidation. Electronically Signed   By: Marcello Moores  Register   On: 03/14/2019 06:31   Dg Chest Port 1 View  Result Date: 04/03/2019 CLINICAL DATA:  Hypoxia EXAM: PORTABLE CHEST 1 VIEW COMPARISON:  04/03/2019, 04/01/2019 FINDINGS: Endotracheal tube tip is about 5.8 cm superior to the carina. Esophageal tube has been removed. Right IJ central venous catheter tip over the distal jugular region. Post sternotomy changes. Small bilateral pleural effusions. Slightly improved aeration at the right base. Persistent  consolidation left lung base. Cardiomegaly with vascular congestion and mild diffuse interstitial opacities suspicious for edema. Aortic atherosclerosis. No pneumothorax. IMPRESSION: 1. Endotracheal tube tip about 5.8 cm superior from the carina. Esophageal tube has been removed 2. Slightly improved aeration of right lung base. 3. Cardiomegaly with vascular congestion and mild interstitial edema. Residual small pleural effusions and persistent dense airspace disease at the left  base. Mild hazy edema or infiltrate at the right base. Electronically Signed   By: Donavan Foil M.D.   On: 04/03/2019 23:05   Dg Chest Port 1 View  Result Date: 04/03/2019 CLINICAL DATA:  Endotracheal tube EXAM: PORTABLE CHEST 1 VIEW COMPARISON:  04/01/2019 FINDINGS: Endotracheal tube in good position. Right jugular catheter tip SVC. NG tube in the stomach. No pneumothorax Bilateral pleural effusions and bibasilar airspace disease right greater than left is unchanged. No edema. IMPRESSION: Endotracheal tube and central line in good position Bibasilar airspace disease and pleural effusion unchanged. Electronically Signed   By: Franchot Gallo M.D.   On: 04/03/2019 11:38   Dg Chest Port 1 View  Result Date: 04/01/2019 CLINICAL DATA:  ETT placement.  History of diabetes, hypertension. EXAM: PORTABLE CHEST 1 VIEW COMPARISON:  Chest radiograph 03/16/2019, CT abdomen/pelvis 03/31/2019. FINDINGS: Single-view AP semi upright chest radiograph. The endotracheal tube terminates approximately 5 cm above the carina. An enteric tube courses below the level of the left hemidiaphragm and is cut from the field of view. Unchanged position of a right IJ approach central venous catheter. Sequela of prior median sternotomy. Cardiomegaly. Atherosclerotic calcification of the aorta. Interval increase in size of a right pleural effusion. Interval increase in right basilar and right perihilar opacities. Mild left basilar atelectasis. A small left pleural  effusion was better appreciated on recent prior CT abdomen/pelvis. No evidence of pneumothorax. IMPRESSION: ET tube terminates approximately 5 cm above the carina. Additional lines and tubes as detailed. Interval increase in right pleural effusion. Interval increase in right basilar and right perihilar opacities which may reflect atelectasis and/or developing pneumonia. Mild left basilar atelectasis. A small left pleural effusion was better appreciated on recent prior CT abdomen/pelvis. Electronically Signed   By: Kellie Simmering   On: 04/01/2019 08:38   Dg Chest Port 1 View  Result Date: 03/27/2019 CLINICAL DATA:  Intubation EXAM: PORTABLE CHEST 1 VIEW COMPARISON:  March 30, 2019 FINDINGS: The endotracheal tube terminates approximately 6.6 cm above the carina. The enteric tube appears to extend below the left hemidiaphragm. The right-sided central venous catheter is well position. The heart size remains enlarged. There is no pneumothorax. Bibasilar airspace opacities are again noted. There are small bilateral pleural effusions. IMPRESSION: 1. Lines and tubes as above. 2. No significant interval change including persistent bibasilar airspace opacities and likely trace to small bilateral pleural effusions. Electronically Signed   By: Constance Holster M.D.   On: 03/28/2019 23:29   Dg Chest Port 1 View  Result Date: 03/06/2019 CLINICAL DATA:  Arrest, tube placement EXAM: PORTABLE CHEST 1 VIEW COMPARISON:  Portable exam 1821 hours compared to 10/04/2018 FINDINGS: Tip of endotracheal tube 8.2 cm above carina. Nasogastric tube extends into abdomen. Enlargement of cardiac silhouette with pulmonary vascular congestion. Atherosclerotic calcification aorta. Perihilar infiltrates likely pulmonary edema. No pleural effusion or pneumothorax. IMPRESSION: Tube positions as above. BILATERAL pulmonary infiltrates question pulmonary edema. Electronically Signed   By: Lavonia Dana M.D.   On: 03/22/2019 18:56   Dg Abd Portable  1v  Result Date: 04/04/2019 CLINICAL DATA:  OG tube placement EXAM: PORTABLE ABDOMEN - 1 VIEW COMPARISON:  04/03/2019 FINDINGS: Enteric tube terminates in the mid gastric body. IMPRESSION: Enteric tube terminates in the mid gastric body. Electronically Signed   By: Julian Hy M.D.   On: 04/04/2019 01:53    Microbiology Recent Results (from the past 240 hour(s))  Surgical pcr screen     Status: None   Collection Time: 04/04/19  4:40 PM   Specimen: Nasal Mucosa; Nasal Swab  Result Value Ref Range Status   MRSA, PCR NEGATIVE NEGATIVE Final   Staphylococcus aureus NEGATIVE NEGATIVE Final    Comment: (NOTE) The Xpert SA Assay (FDA approved for NASAL specimens in patients 23 years of age and older), is one component of a comprehensive surveillance program. It is not intended to diagnose infection nor to guide or monitor treatment. Performed at [facility], [place] [address], [place], [place] [address]   Culture, blood (Routine X 2) w Reflex to ID Panel     Status: None   Collection Time: 04/08/19  7:01 PM   Specimen: BLOOD RIGHT HAND  Result Value Ref Range Status   Specimen Description BLOOD RIGHT HAND  Final   Special Requests   Final    BOTTLES DRAWN AEROBIC AND ANAEROBIC Blood Culture adequate volume   Culture   Final    NO GROWTH 5 DAYS Performed at [facility], [place] [address] [place], [place] [address]    Report Status 04/13/2019 FINAL  Final  Culture, blood (Routine X 2) w Reflex to ID Panel     Status: None   Collection Time: 04/08/19  7:07 PM   Specimen: BLOOD RIGHT HAND  Result Value Ref Range Status   Specimen Description BLOOD RIGHT HAND  Final   Special Requests   Final    BOTTLES DRAWN AEROBIC AND ANAEROBIC Blood Culture adequate volume   Culture   Final    NO GROWTH 5 DAYS Performed at [facility], [place] [address], [place], [place] [address]    Report Status 04/13/2019 FINAL  Final  Culture, Urine     Status: None   Collection Time:  04/08/19  8:44 PM   Specimen: Urine, Random  Result Value Ref Range Status   Specimen Description URINE, RANDOM  Final   Special Requests STERILE CONTAINER  Final   Culture   Final    NO GROWTH Performed at [facility], [place] [address], [place], [place] [address]    Report Status 04/10/2019 FINAL  Final    Lab Basic Metabolic Panel: Recent Labs  Lab 04/07/19 0150 04/07/19 0434 04/08/19 0452 04/09/19 0353 04/10/19 0339 04/10/19 0407 05-05-2019 0348 05-05-2019 0406  NA 141  --   --  142 141 143 141 141  K 3.4*  --   --  4.4 4.0 4.2 5.1 4.9  CL 103  --   --  98  --  98 97*  --   CO2 30  --   --  30  --  35* 32  --   GLUCOSE 179*  --   --  169*  --  186* 153*  --   BUN 48*  --   --  53*  --  54* 58*  --   CREATININE 1.36*  --   --  1.18  --  1.22 1.27*  --   CALCIUM 7.9*  --   --  8.6*  --  8.7* 8.7*  --   MG  --  2.1 1.7 1.7  --  1.7 2.1  --   PHOS  --  1.8* 2.5 3.0  --  4.3 5.0*  --    Liver Function Tests: Recent Labs  Lab 04/08/19 0452  AST 95*  ALT 99*  ALKPHOS 95  BILITOT 0.6  PROT 4.7*  ALBUMIN 1.5*   No results for input(s): LIPASE, AMYLASE in the last 168  hours. No results for input(s): AMMONIA in the last 168 hours. CBC: Recent Labs  Lab 04/07/19 0434 04/08/19 0452 04/09/19 0353 04/10/19 0339 04/10/19 0407 04/13/19 0348 04/13/19 0406  WBC 5.6 5.8 6.0  --  6.9 7.8  --   HGB 10.0* 10.3* 10.4* 9.9* 9.8* 9.9* 9.9*  HCT 30.8* 31.2* 31.4* 29.0* 30.7* 31.5* 29.0*  MCV 89.0 87.4 86.7  --  88.0 89.7  --   PLT 152 156 162  --  194 218  --    Cardiac Enzymes: Recent Labs  Lab 04/07/19 1229  CKTOTAL 183  CKMB 2.1   Sepsis Labs: Recent Labs  Lab 04/08/19 0032 04/08/19 0452 04/08/19 1907 04/09/19 0353 04/10/19 0407 2019/04/13 0348  PROCALCITON  --   --  0.73 0.68 0.47  --   WBC  --  5.8  --  6.0 6.9 7.8  LATICACIDVEN 1.1  --  1.1  --   --   --     Procedures/Operations  Amputation   Jennet Maduro 04/13/2019, 4:56 PM

## 2019-05-07 NOTE — Progress Notes (Signed)
Expiration note: Expired at 1925 w/son Joe at bedside.  Pronounced by Winfred Burn and Wylene Simmer.  No heart sounds or breath sounds auscultated.  Fentanyl IV 150 mls wasted in narcotics container and witnessed by [employer].  Belongings given to son Wille Glaser.

## 2019-05-07 NOTE — Progress Notes (Signed)
Pt transported from 2H14 to MRI and back with no complications.

## 2019-05-07 NOTE — Procedures (Signed)
Extubation Procedure Note  Patient Details:   Name: VIET KEMMERER DOB: 01/29/1941 MRN: [id number]   Airway Documentation:  Airway 8 mm (Active)  Secured at (cm) 25 cm 2019-05-02 1211  Measured From Lips 05/02/2019 [place] 05-02-2019 1211  Secured By [employer] May 02, 2019 1211  Tube Holder Repositioned Yes May 02, 2019 1211  Cuff Pressure (cm H2O) 30 cm H2O 05-02-19 0807  Site Condition Dry 02-May-2019 1211   Vent end date: 2019-05-02 Vent end time: 1545   Evaluation  O2 sats: stable throughout Complications: No apparent complications Patient did tolerate procedure well. Bilateral Breath Sounds: Diminished   No   RT extubated pt per MD order and family request for one way extubation. There was not a cuff leak present prior to extubation which was expected. Pt extubated to room air and is resting comfortably.  Dorrien Grunder A Charl Wellen 05/02/19, 3:51 PM

## 2019-05-07 NOTE — Progress Notes (Signed)
Patient transported to MRI & back on the ventilator with no problems.  Kathie Dike RRT

## 2019-05-07 NOTE — Progress Notes (Signed)
Patient's bilateral hearing aids, and plain yellow colored wedding band given to wife [name] in pink denture cup to take home.

## 2019-05-07 NOTE — Progress Notes (Signed)
Extensive discussion with wife and 2 sons.  MRI results relayed.  Informed that there are no signs of damage on MRI but patient is failing to respond and follow command and failing to wean.  Recommendations are more time via trach/peg and observation.  After a long discussion, the family agreed (not originally) that patient has stated on multiple occasions that if he had to have another amputation that he would rather die, he did not want resuscitation or intubation let alone a trach/peg.  After further discussion, decision was made to proceed with a palliative approach with full DNR, start morphine and extubate when family is ready.  Orders placed.  The patient is critically ill with multiple organ systems failure and requires high complexity decision making for assessment and support, frequent evaluation and titration of therapies, application of advanced monitoring technologies and extensive interpretation of multiple databases.   Critical Care Time devoted to patient care services described in this note is  60  Minutes. This time reflects time of care of this signee Dr Jennet Maduro. This critical care time does not reflect procedure time, or teaching time or supervisory time of PA/NP/Med student/Med Resident etc but could involve care discussion time.  Rush Farmer, M.D. [facility] Pulmonary/Critical Care Medicine. Pager: [phone]. After hours pager: [phone].

## 2019-05-07 NOTE — Progress Notes (Signed)
CDS notified of expiration #[id number]

## 2019-05-07 NOTE — Progress Notes (Signed)
Responded to unit page to come and support patient family at bedside.  Spoke early with family in [place] and the son told me that the family was preparing to withdraw life support and it was very hard for them.  So I visited with them again to continue support. Prayed with Family and patient at bedside. Provided ministry of presence, emotional and spiritual support.  Chaplain available as needed.  Jaclynn Major, [place], [facility], Pager [phone]

## 2019-05-07 NOTE — Progress Notes (Signed)
Noted changed to comfort care, please call if neurology can be of any further assistance.  Roland Rack, MD [facility] [phone]  If 7pm- 7am, please page neurology on call as listed in [place].

## 2019-05-07 NOTE — Progress Notes (Signed)
Patient was unable to complete his MRI Scan due him not keeping his head still will in the scanner. Patient was received the max doses of sedation.

## 2019-05-07 DEATH — deceased

## 2019-05-22 ENCOUNTER — Telehealth: Payer: Self-pay

## 2019-05-22 NOTE — Telephone Encounter (Signed)
Received dc from [facility].  Dc will be taken to Pulmonary Unit for Doctor Wert to sign.  The first one got lost in the mail so this is a replacement one.  On 05/24/2019 Received dc back from Doctor Wert. I called the funeral home and they will come and pick it up.
